# Patient Record
Sex: Male | Born: 1960
Health system: Southern US, Community
[De-identification: ages and names within clinical notes are randomized; demographics above are authoritative.]

## PROBLEM LIST (undated history)

## (undated) DIAGNOSIS — Z86718 Personal history of other venous thrombosis and embolism: Secondary | ICD-10-CM

## (undated) DIAGNOSIS — G709 Myoneural disorder, unspecified: Secondary | ICD-10-CM

## (undated) DIAGNOSIS — R251 Tremor, unspecified: Secondary | ICD-10-CM

## (undated) DIAGNOSIS — B3323 Viral pericarditis: Secondary | ICD-10-CM

## (undated) DIAGNOSIS — M7989 Other specified soft tissue disorders: Secondary | ICD-10-CM

## (undated) DIAGNOSIS — I8290 Acute embolism and thrombosis of unspecified vein: Secondary | ICD-10-CM

## (undated) DIAGNOSIS — M2550 Pain in unspecified joint: Secondary | ICD-10-CM

## (undated) DIAGNOSIS — I499 Cardiac arrhythmia, unspecified: Secondary | ICD-10-CM

## (undated) DIAGNOSIS — M549 Dorsalgia, unspecified: Secondary | ICD-10-CM

## (undated) DIAGNOSIS — S73005A Unspecified dislocation of left hip, initial encounter: Secondary | ICD-10-CM

## (undated) DIAGNOSIS — R7303 Prediabetes: Secondary | ICD-10-CM

## (undated) DIAGNOSIS — M87052 Idiopathic aseptic necrosis of left femur: Secondary | ICD-10-CM

## (undated) DIAGNOSIS — R2689 Other abnormalities of gait and mobility: Secondary | ICD-10-CM

## (undated) DIAGNOSIS — K219 Gastro-esophageal reflux disease without esophagitis: Secondary | ICD-10-CM

## (undated) DIAGNOSIS — M1990 Unspecified osteoarthritis, unspecified site: Secondary | ICD-10-CM

## (undated) DIAGNOSIS — R0602 Shortness of breath: Secondary | ICD-10-CM

## (undated) DIAGNOSIS — J45909 Unspecified asthma, uncomplicated: Secondary | ICD-10-CM

## (undated) DIAGNOSIS — K5900 Constipation, unspecified: Secondary | ICD-10-CM

## (undated) DIAGNOSIS — T4145XA Adverse effect of unspecified anesthetic, initial encounter: Secondary | ICD-10-CM

## (undated) DIAGNOSIS — I10 Essential (primary) hypertension: Secondary | ICD-10-CM

## (undated) DIAGNOSIS — I2510 Atherosclerotic heart disease of native coronary artery without angina pectoris: Secondary | ICD-10-CM

## (undated) DIAGNOSIS — I480 Paroxysmal atrial fibrillation: Secondary | ICD-10-CM

## (undated) DIAGNOSIS — Z95818 Presence of other cardiac implants and grafts: Secondary | ICD-10-CM

## (undated) DIAGNOSIS — I4891 Unspecified atrial fibrillation: Secondary | ICD-10-CM

## (undated) DIAGNOSIS — T8859XA Other complications of anesthesia, initial encounter: Secondary | ICD-10-CM

## (undated) DIAGNOSIS — G629 Polyneuropathy, unspecified: Secondary | ICD-10-CM

## (undated) HISTORY — DX: Unspecified atrial fibrillation: I48.91

## (undated) HISTORY — DX: Dorsalgia, unspecified: M54.9

## (undated) HISTORY — DX: Personal history of other venous thrombosis and embolism: Z86.718

## (undated) HISTORY — PX: TENDON REPAIR: SHX5111

## (undated) HISTORY — DX: Other abnormalities of gait and mobility: R26.89

## (undated) HISTORY — PX: HERNIA REPAIR: SHX51

## (undated) HISTORY — DX: Pain in unspecified joint: M25.50

## (undated) HISTORY — DX: Prediabetes: R73.03

## (undated) HISTORY — DX: Other specified soft tissue disorders: M79.89

## (undated) HISTORY — PX: TOTAL KNEE ARTHROPLASTY: SHX125

## (undated) HISTORY — DX: Polyneuropathy, unspecified: G62.9

## (undated) HISTORY — DX: Unspecified asthma, uncomplicated: J45.909

## (undated) HISTORY — DX: Shortness of breath: R06.02

## (undated) HISTORY — PX: JOINT REPLACEMENT: SHX530

## (undated) HISTORY — PX: CARDIOVASCULAR STRESS TEST: SHX262

## (undated) HISTORY — PX: CARDIAC CATHETERIZATION: SHX172

## (undated) HISTORY — DX: Constipation, unspecified: K59.00

## (undated) HISTORY — DX: Tremor, unspecified: R25.1

## (undated) HISTORY — PX: COLONOSCOPY: SHX174

## (undated) HISTORY — PX: BACK SURGERY: SHX140

## (undated) HISTORY — DX: Paroxysmal atrial fibrillation: I48.0

---
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
## 1998-08-12 ENCOUNTER — Other Ambulatory Visit: Admission: RE | Admit: 1998-08-12 | Discharge: 1998-08-12 | Payer: Self-pay | Admitting: Orthopedic Surgery

## 2000-10-18 ENCOUNTER — Ambulatory Visit (HOSPITAL_COMMUNITY): Admission: RE | Admit: 2000-10-18 | Discharge: 2000-10-18 | Payer: Self-pay | Admitting: General Surgery

## 2000-10-18 ENCOUNTER — Encounter (INDEPENDENT_AMBULATORY_CARE_PROVIDER_SITE_OTHER): Payer: Self-pay | Admitting: Specialist

## 2002-12-19 ENCOUNTER — Ambulatory Visit (HOSPITAL_COMMUNITY): Admission: RE | Admit: 2002-12-19 | Discharge: 2002-12-20 | Payer: Self-pay | Admitting: General Surgery

## 2002-12-19 ENCOUNTER — Encounter (INDEPENDENT_AMBULATORY_CARE_PROVIDER_SITE_OTHER): Payer: Self-pay | Admitting: Specialist

## 2006-11-05 ENCOUNTER — Emergency Department (HOSPITAL_COMMUNITY): Admission: EM | Admit: 2006-11-05 | Discharge: 2006-11-05 | Payer: Self-pay | Admitting: Emergency Medicine

## 2007-07-23 ENCOUNTER — Ambulatory Visit (HOSPITAL_COMMUNITY): Admission: RE | Admit: 2007-07-23 | Discharge: 2007-07-23 | Payer: Self-pay | Admitting: Family Medicine

## 2007-12-13 ENCOUNTER — Ambulatory Visit (HOSPITAL_COMMUNITY): Admission: RE | Admit: 2007-12-13 | Discharge: 2007-12-13 | Payer: Self-pay | Admitting: Family Medicine

## 2008-10-27 ENCOUNTER — Ambulatory Visit: Payer: Self-pay | Admitting: Family Medicine

## 2008-10-27 DIAGNOSIS — J029 Acute pharyngitis, unspecified: Secondary | ICD-10-CM | POA: Insufficient documentation

## 2008-10-28 ENCOUNTER — Encounter: Payer: Self-pay | Admitting: Family Medicine

## 2009-12-22 ENCOUNTER — Inpatient Hospital Stay (HOSPITAL_COMMUNITY)
Admission: RE | Admit: 2009-12-22 | Discharge: 2009-12-25 | Payer: Self-pay | Source: Home / Self Care | Attending: Orthopedic Surgery | Admitting: Orthopedic Surgery

## 2010-01-11 ENCOUNTER — Encounter
Admission: RE | Admit: 2010-01-11 | Discharge: 2010-02-01 | Payer: Self-pay | Source: Home / Self Care | Attending: Orthopedic Surgery | Admitting: Orthopedic Surgery

## 2010-01-23 ENCOUNTER — Encounter: Payer: Self-pay | Admitting: Family Medicine

## 2010-02-01 NOTE — Assessment & Plan Note (Signed)
Summary: SORE THROAT/KH   Vital Signs:  Patient Profile:   50 Years Old Male CC:      sore throat Weight:      279 pounds O2 Sat:      97 % O2 treatment:    Room Air Temp:     98.0 degrees F oral Pulse rate:   75 / minute Resp:     16 per minute BP sitting:   149 / 89  (left arm) Cuff size:   regular  Pt. in pain?   no  Vitals Entered By: Angie Fava (October 27, 2008 4:43 PM)                   Updated Prior Medication List: * CELEBREX once a day LISINOPRIL 20 MG TABS (LISINOPRIL) as directed  History of Present Illness Chief Complaint: sore throat History of Present Illness: Subjective: Patient complains of left sided sore throat for 4 days. + mild cough No pleuritic pain No wheezing No nasal congestion No post-nasal drainage No sinus pain/pressure No itchy/red eyes No earache No hemoptysis No SOB No fever/chills No nausea No vomiting No abdominal pain No diarrhea No skin rashes No fatigue No myalgias No headache    REVIEW OF SYSTEMS Constitutional Symptoms      Denies fever, chills, night sweats, weight loss, weight gain, and fatigue.  Eyes       Denies change in vision, eye pain, eye discharge, glasses, contact lenses, and eye surgery. Ear/Nose/Throat/Mouth       Complains of sore throat.      Denies hearing loss/aids, change in hearing, ear pain, ear discharge, dizziness, frequent runny nose, frequent nose bleeds, sinus problems, hoarseness, and tooth pain or bleeding.  Respiratory       Complains of dry cough.      Denies productive cough, wheezing, shortness of breath, asthma, bronchitis, and emphysema/COPD.  Cardiovascular       Denies murmurs, chest pain, and tires easily with exhertion.    Gastrointestinal       Denies stomach pain, nausea/vomiting, diarrhea, constipation, blood in bowel movements, and indigestion. Genitourniary       Denies painful urination, kidney stones, and loss of urinary control. Neurological       Denies  paralysis, seizures, and fainting/blackouts. Musculoskeletal       Denies muscle pain, joint pain, joint stiffness, decreased range of motion, redness, swelling, muscle weakness, and gout.  Skin       Denies bruising, unusual mles/lumps or sores, and hair/skin or nail changes.  Psych       Denies mood changes, temper/anger issues, anxiety/stress, speech problems, depression, and sleep problems.  Past History:  Past Medical History: high blood pressure  Past Surgical History: Denies surgical history  Family History: none listed  Social History: Alcohol use-yes Drug use-no tobacco use - noDrug Use:  no    Objective:  No acute distress  Eyes:  Pupils are equal, round, and reactive to light and accomdation.  Extraocular movement is intact.  Conjunctivae are not inflamed.  Ears:  Canals normal.  Tympanic membranes normal.   Nose:  Normal septum.  Normal turbinates, mildly congested    No sinus tenderness present.  Pharynx:  Mildly erythematous Neck:  Supple.   Tender shotty left tonsillar node Lungs:  Clear to auscultation.  Breath sounds are equal.  Heart:  Regular rate and rhythm without murmurs, rubs, or gallops.  Abdomen:  Nontender without masses or hepatosplenomegaly.  Bowel sounds are present.  No CVA or flank tenderness.  Rapid strep test negative  Assessment New Problems: ACUTE PHARYNGITIS (ICD-462)  ? non group A strep  Plan New Medications/Changes: AZITHROMYCIN 250 MG TABS (AZITHROMYCIN) Two tabs by mouth on day 1, then 1 tab daily on days 2 through 5  #6 tabs x 0, 10/27/2008, Donna Christen MD  New Orders: Rapid Strep-[facility] [[id number]] T-Culture, Throat [[id number]] New Patient Level III [99203] Planning Comments:   Throat culture. Begin Azithromycin.  Ibuprofen.   Follow-up with PCP if not improving.   The patient and/or caregiver has been counseled thoroughly with regard to medications prescribed including dosage, schedule, interactions, rationale for use,  and possible side effects and they verbalize understanding.  Diagnoses and expected course of recovery discussed and will return if not improved as expected or if the condition worsens. Patient and/or caregiver verbalized understanding.  Prescriptions: AZITHROMYCIN 250 MG TABS (AZITHROMYCIN) Two tabs by mouth on day 1, then 1 tab daily on days 2 through 5  #6 tabs x 0   Entered and Authorized by:   Donna Christen MD   Signed by:   Donna Christen MD on 10/27/2008   Method used:   Print then Give to Patient   RxID:   [id number]   Appended Document: SORE THROAT/KH rapid strep - negative Loel Ro, RN

## 2010-02-02 ENCOUNTER — Ambulatory Visit: Payer: Commercial Managed Care - PPO | Admitting: Physical Therapy

## 2010-02-02 NOTE — Discharge Summary (Signed)
Travis Huff, Travis Huff               ACCOUNT NO.:  [id number]  MEDICAL RECORD NO.:  [id number]          PATIENT TYPE:  INP  LOCATION:  1612                         FACILITY:  [facility]  PHYSICIAN:  Ollen Gross, M.D.    DATE OF BIRTH:  08-Jun-1960  DATE OF ADMISSION:  12/22/2009 DATE OF DISCHARGE:  12/25/2009                              DISCHARGE SUMMARY   ADMISSION DIAGNOSES: 1. Osteoarthritis, left knee greater than right knee. 2. Asthma. 3. Hypertension. 4. Past history of viral pericarditis. 5. Spinal stenosis.  DISCHARGE DIAGNOSES: 1. Osteoarthritis left knee status post left total-knee replacement     arthroplasty. 2. Osteoarthritis right knee. 3. Postoperative acute blood loss anemia, did not require transfusion. 4. Asthma. 5. Hypertension. 6. Past history of viral pericarditis. 7. Spinal stenosis.  PROCEDURE:  December 22, 2009, left total-knee.  SURGEON:  Ollen Gross, M.D.  ASSISTANT:  Alexzandrew L. Perkins, P.A.C.  ANESTHESIA:  General.  TOURNIQUET TIME:  49 minutes.  CONSULTATIONS:  None.  BRIEF HISTORY:  Travis Huff is a 50 year old male with end-stage arthritis of both knees, left more symptomatic than the right, significant valgus malalignment deformity with his instability felt to be bone-on-bone and felt to be a good candidate for surgery.  LABORATORY DATA:  Preoperative CBC showed a hemoglobin of 16.4, hematocrit of 48.0, white cell count 8.9, platelets 173.  Postoperative hemoglobin 12.2 and then 10.4.  Last noted H and H 9.7 and 28.1.  PT/INR 12.4 and 0.9 with a PTT of 37.  Serial prothrombin times were followed per Coumadin protocol.  Last noted PT/INR 20.7 and 1.76.  Chemistry panel on admission all within normal limits.  Serial BMETs were followed for 48 hours and electrolytes remained within normal limits.  Glucose did go up from 94 to 128 and back down to 108.  Preoperative UA was negative with the exception of large bilirubin.  He did have  a hemoglobin A1c ordered which was normal at 5.5.  Nasal swabs were negative for Staphylococcus aureus and negative for MRSA.  RADIOLOGIC:  Preoperative EKG dated December 14, 2009, normal sinus rhythm, incomplete right bundle branch block, confirmed by Dr. Nona Dell.  HOSPITAL COURSE:  The patient was admitted to [facility], taken to the OR and underwent the above-stated procedure without complication.  The patient tolerated the procedure well, had a fair amount of pain on the evening of surgery but doing better on the morning of day one.  Started getting up out of bed and weightbearing as tolerated.  Hemoglobin looked good at 12.2.  Encouraged to use p.o. medications.  By day two the patient was doing a little bit better, pain was decreasing, progressing with therapy.  He was on Coumadin.  Home health was being arranged and by day three he had met his goals, progressing well.  Hemoglobin was down to 9.7 but he was asymptomatic with this and he was discharged home.  DISCHARGE PLAN: 1. The patient was discharged home on December 25, 2009. 2. Discharge diagnoses. Please see above.  DISCHARGE MEDICATIONS:  Robaxin, Percocet, Coumadin, Ambien, lisinopril/HCTZ.  DIET:  Heart-healthy diet.  ACTIVITIES:  Weightbearing as tolerated.  Total-knee protocol.  FOLLOWUP:  Follow up in two weeks.  DISPOSITION:  Home.  CONDITION ON DISCHARGE:  Improved.     Alexzandrew L. Julien Girt, P.A.C.   ______________________________ Ollen Gross, M.D.    ALP/MEDQ  D:  01/13/2010  T:  01/13/2010  Job:  [id number]  cc:   [facility]  Electronically Signed by Patrica Duel P.A.C. on 01/20/2010 11:08:24 AM Electronically Signed by Ollen Gross M.D. on 02/02/2010 03:33:20 PM

## 2010-02-04 ENCOUNTER — Ambulatory Visit: Payer: Commercial Managed Care - PPO | Attending: Orthopedic Surgery | Admitting: Physical Therapy

## 2010-02-04 DIAGNOSIS — R262 Difficulty in walking, not elsewhere classified: Secondary | ICD-10-CM | POA: Insufficient documentation

## 2010-02-04 DIAGNOSIS — IMO0001 Reserved for inherently not codable concepts without codable children: Secondary | ICD-10-CM | POA: Insufficient documentation

## 2010-02-04 DIAGNOSIS — M25669 Stiffness of unspecified knee, not elsewhere classified: Secondary | ICD-10-CM | POA: Insufficient documentation

## 2010-02-04 DIAGNOSIS — M25569 Pain in unspecified knee: Secondary | ICD-10-CM | POA: Insufficient documentation

## 2010-02-04 DIAGNOSIS — Z96659 Presence of unspecified artificial knee joint: Secondary | ICD-10-CM | POA: Insufficient documentation

## 2010-02-07 ENCOUNTER — Ambulatory Visit: Payer: Commercial Managed Care - PPO | Admitting: Physical Therapy

## 2010-02-09 ENCOUNTER — Ambulatory Visit: Payer: Commercial Managed Care - PPO | Admitting: Physical Therapy

## 2010-02-11 ENCOUNTER — Ambulatory Visit: Payer: Commercial Managed Care - PPO | Admitting: Physical Therapy

## 2010-02-14 ENCOUNTER — Ambulatory Visit: Payer: Commercial Managed Care - PPO | Admitting: Physical Therapy

## 2010-02-16 ENCOUNTER — Ambulatory Visit: Payer: Commercial Managed Care - PPO | Admitting: Physical Therapy

## 2010-02-18 ENCOUNTER — Ambulatory Visit: Payer: Commercial Managed Care - PPO | Admitting: Physical Therapy

## 2010-02-21 ENCOUNTER — Ambulatory Visit: Payer: Commercial Managed Care - PPO | Admitting: Physical Therapy

## 2010-02-23 ENCOUNTER — Ambulatory Visit: Payer: Commercial Managed Care - PPO | Admitting: Physical Therapy

## 2010-02-25 ENCOUNTER — Ambulatory Visit: Payer: Commercial Managed Care - PPO | Admitting: Physical Therapy

## 2010-02-28 ENCOUNTER — Ambulatory Visit: Payer: Commercial Managed Care - PPO

## 2010-03-02 ENCOUNTER — Ambulatory Visit: Payer: Commercial Managed Care - PPO | Admitting: Physical Therapy

## 2010-03-04 ENCOUNTER — Ambulatory Visit: Payer: 59 | Attending: Orthopedic Surgery | Admitting: Physical Therapy

## 2010-03-04 DIAGNOSIS — M25669 Stiffness of unspecified knee, not elsewhere classified: Secondary | ICD-10-CM | POA: Insufficient documentation

## 2010-03-04 DIAGNOSIS — R262 Difficulty in walking, not elsewhere classified: Secondary | ICD-10-CM | POA: Insufficient documentation

## 2010-03-04 DIAGNOSIS — IMO0001 Reserved for inherently not codable concepts without codable children: Secondary | ICD-10-CM | POA: Insufficient documentation

## 2010-03-04 DIAGNOSIS — M25569 Pain in unspecified knee: Secondary | ICD-10-CM | POA: Insufficient documentation

## 2010-03-04 DIAGNOSIS — Z96659 Presence of unspecified artificial knee joint: Secondary | ICD-10-CM | POA: Insufficient documentation

## 2010-03-14 LAB — BASIC METABOLIC PANEL WITH GFR
BUN: 10 mg/dL (ref 6–23)
BUN: 6 mg/dL (ref 6–23)
CO2: 27 meq/L (ref 19–32)
CO2: 31 meq/L (ref 19–32)
Calcium: 8.1 mg/dL — ABNORMAL LOW (ref 8.4–10.5)
Calcium: 8.4 mg/dL (ref 8.4–10.5)
Chloride: 102 meq/L (ref 96–112)
Chloride: 97 meq/L (ref 96–112)
Creatinine, Ser: 1.12 mg/dL (ref 0.4–1.5)
Creatinine, Ser: 1.22 mg/dL (ref 0.4–1.5)
GFR calc non Af Amer: >60 mL/min
GFR calc non Af Amer: >60 mL/min
Glucose, Bld: 108 mg/dL — ABNORMAL HIGH (ref 70–99)
Glucose, Bld: 128 mg/dL — ABNORMAL HIGH (ref 70–99)
Potassium: 4.5 meq/L (ref 3.5–5.1)
Potassium: 4.5 meq/L (ref 3.5–5.1)
Sodium: 135 meq/L (ref 135–145)
Sodium: 136 meq/L (ref 135–145)

## 2010-03-14 LAB — CBC
HCT: 28.1 % — ABNORMAL LOW (ref 39.0–52.0)
HCT: 30.5 % — ABNORMAL LOW (ref 39.0–52.0)
HCT: 36.3 % — ABNORMAL LOW (ref 39.0–52.0)
HCT: 48 % (ref 39.0–52.0)
Hemoglobin: 10.4 g/dL — ABNORMAL LOW (ref 13.0–17.0)
Hemoglobin: 12.2 g/dL — ABNORMAL LOW (ref 13.0–17.0)
Hemoglobin: 9.7 g/dL — ABNORMAL LOW (ref 13.0–17.0)
Hemoglobin: 16.4 g/dL (ref 13.0–17.0)
MCH: 31.2 pg (ref 26.0–34.0)
MCH: 31.4 pg (ref 26.0–34.0)
MCH: 31.5 pg (ref 26.0–34.0)
MCH: 31.6 pg (ref 26.0–34.0)
MCHC: 33.6 g/dL (ref 30.0–36.0)
MCHC: 34.1 g/dL (ref 30.0–36.0)
MCHC: 34.5 g/dL (ref 30.0–36.0)
MCHC: 34.2 g/dL (ref 30.0–36.0)
MCV: 91.2 fL (ref 78.0–100.0)
MCV: 91.6 fL (ref 78.0–100.0)
MCV: 93.6 fL (ref 78.0–100.0)
MCV: 92.5 fL (ref 78.0–100.0)
Platelets: 137 K/uL — ABNORMAL LOW (ref 150–400)
Platelets: 141 K/uL — ABNORMAL LOW (ref 150–400)
Platelets: 175 10*3/uL (ref 150–400)
Platelets: 173 10*3/uL (ref 150–400)
RBC: 3.08 MIL/uL — ABNORMAL LOW (ref 4.22–5.81)
RBC: 3.33 MIL/uL — ABNORMAL LOW (ref 4.22–5.81)
RBC: 3.88 MIL/uL — ABNORMAL LOW (ref 4.22–5.81)
RBC: 5.19 MIL/uL (ref 4.22–5.81)
RDW: 12.4 % (ref 11.5–15.5)
RDW: 12.4 % (ref 11.5–15.5)
RDW: 12.7 % (ref 11.5–15.5)
RDW: 12.5 % (ref 11.5–15.5)
WBC: 10.3 K/uL (ref 4.0–10.5)
WBC: 11.6 K/uL — ABNORMAL HIGH (ref 4.0–10.5)
WBC: 12 10*3/uL — ABNORMAL HIGH (ref 4.0–10.5)
WBC: 8.9 10*3/uL (ref 4.0–10.5)

## 2010-03-14 LAB — HEMOGLOBIN A1C
Hgb A1c MFr Bld: 5.5 %
Mean Plasma Glucose: 111 mg/dL

## 2010-03-14 LAB — PROTIME-INR
INR: 1.12 (ref 0.00–1.49)
INR: 1.64 — ABNORMAL HIGH (ref 0.00–1.49)
INR: 1.76 — ABNORMAL HIGH (ref 0.00–1.49)
INR: 0.9 (ref 0.00–1.49)
Prothrombin Time: 14.6 s (ref 11.6–15.2)
Prothrombin Time: 19.6 s — ABNORMAL HIGH (ref 11.6–15.2)
Prothrombin Time: 20.7 s — ABNORMAL HIGH (ref 11.6–15.2)
Prothrombin Time: 12.4 s (ref 11.6–15.2)

## 2010-03-14 LAB — URINALYSIS, ROUTINE W REFLEX MICROSCOPIC
Glucose, UA: NEGATIVE mg/dL
Hgb urine dipstick: NEGATIVE
Ketones, ur: NEGATIVE mg/dL
Nitrite: NEGATIVE
Protein, ur: NEGATIVE mg/dL
Specific Gravity, Urine: 1.027 (ref 1.005–1.030)
Urobilinogen, UA: 0.2 mg/dL (ref 0.0–1.0)
pH: 5.5 (ref 5.0–8.0)

## 2010-03-14 LAB — COMPREHENSIVE METABOLIC PANEL WITH GFR
ALT: 31 U/L (ref 0–53)
AST: 28 U/L (ref 0–37)
Albumin: 3.8 g/dL (ref 3.5–5.2)
Alkaline Phosphatase: 78 U/L (ref 39–117)
BUN: 16 mg/dL (ref 6–23)
CO2: 26 meq/L (ref 19–32)
Calcium: 9.6 mg/dL (ref 8.4–10.5)
Chloride: 103 meq/L (ref 96–112)
Creatinine, Ser: 1.12 mg/dL (ref 0.4–1.5)
GFR calc non Af Amer: >60 mL/min
Glucose, Bld: 94 mg/dL (ref 70–99)
Potassium: 4.3 meq/L (ref 3.5–5.1)
Sodium: 139 meq/L (ref 135–145)
Total Bilirubin: 0.6 mg/dL (ref 0.3–1.2)
Total Protein: 6.9 g/dL (ref 6.0–8.3)

## 2010-03-14 LAB — TYPE AND SCREEN
ABO/RH(D): O NEG
Antibody Screen: NEGATIVE

## 2010-03-14 LAB — SURGICAL PCR SCREEN
MRSA, PCR: NEGATIVE
Staphylococcus aureus: NEGATIVE

## 2010-03-14 LAB — ABO/RH: ABO/RH(D): O NEG

## 2010-03-14 LAB — APTT: aPTT: 37 seconds (ref 24–37)

## 2010-04-04 ENCOUNTER — Other Ambulatory Visit: Payer: Self-pay | Admitting: Orthopedic Surgery

## 2010-04-04 ENCOUNTER — Encounter (HOSPITAL_COMMUNITY): Payer: 59

## 2010-04-04 ENCOUNTER — Other Ambulatory Visit (HOSPITAL_COMMUNITY): Payer: Self-pay | Admitting: Orthopedic Surgery

## 2010-04-04 ENCOUNTER — Ambulatory Visit (HOSPITAL_COMMUNITY)
Admission: RE | Admit: 2010-04-04 | Discharge: 2010-04-04 | Disposition: A | Discharge status: Home / Self Care | Payer: 59 | Source: Ambulatory Visit | Attending: Orthopedic Surgery | Admitting: Orthopedic Surgery

## 2010-04-04 DIAGNOSIS — Z01812 Encounter for preprocedural laboratory examination: Secondary | ICD-10-CM | POA: Insufficient documentation

## 2010-04-04 DIAGNOSIS — Z01811 Encounter for preprocedural respiratory examination: Secondary | ICD-10-CM

## 2010-04-04 DIAGNOSIS — Z01818 Encounter for other preprocedural examination: Secondary | ICD-10-CM | POA: Insufficient documentation

## 2010-04-04 DIAGNOSIS — Z01810 Encounter for preprocedural cardiovascular examination: Secondary | ICD-10-CM | POA: Insufficient documentation

## 2010-04-04 LAB — PROTIME-INR
INR: 0.92 (ref 0.00–1.49)
Prothrombin Time: 12.6 seconds (ref 11.6–15.2)

## 2010-04-04 LAB — COMPREHENSIVE METABOLIC PANEL
ALT: 19 U/L (ref 0–53)
AST: 19 U/L (ref 0–37)
Albumin: 3.7 g/dL (ref 3.5–5.2)
Alkaline Phosphatase: 79 U/L (ref 39–117)
BUN: 15 mg/dL (ref 6–23)
CO2: 27 mEq/L (ref 19–32)
Calcium: 9.4 mg/dL (ref 8.4–10.5)
Chloride: 106 mEq/L (ref 96–112)
Creatinine, Ser: 1.09 mg/dL (ref 0.4–1.5)
GFR calc Af Amer: >60 mL/min (ref >60)
GFR calc non Af Amer: >60 mL/min (ref >60)
Glucose, Bld: 93 mg/dL (ref 70–99)
Potassium: 4.3 mEq/L (ref 3.5–5.1)
Sodium: 138 mEq/L (ref 135–145)
Total Bilirubin: 0.6 mg/dL (ref 0.3–1.2)
Total Protein: 7 g/dL (ref 6.0–8.3)

## 2010-04-04 LAB — URINALYSIS, ROUTINE W REFLEX MICROSCOPIC
Bilirubin Urine: NEGATIVE
Glucose, UA: NEGATIVE mg/dL
Hgb urine dipstick: NEGATIVE
Ketones, ur: NEGATIVE mg/dL
Nitrite: NEGATIVE
Protein, ur: NEGATIVE mg/dL
Specific Gravity, Urine: 1.019 (ref 1.005–1.030)
Urobilinogen, UA: 0.2 mg/dL (ref 0.0–1.0)
pH: 5.5 (ref 5.0–8.0)

## 2010-04-04 LAB — CBC
HCT: 45.1 % (ref 39.0–52.0)
Hemoglobin: 14.6 g/dL (ref 13.0–17.0)
MCH: 28 pg (ref 26.0–34.0)
MCHC: 32.4 g/dL (ref 30.0–36.0)
MCV: 86.4 fL (ref 78.0–100.0)
Platelets: 192 10*3/uL (ref 150–400)
RBC: 5.22 MIL/uL (ref 4.22–5.81)
RDW: 14.5 % (ref 11.5–15.5)
WBC: 6.9 10*3/uL (ref 4.0–10.5)

## 2010-04-04 LAB — SURGICAL PCR SCREEN
MRSA, PCR: NEGATIVE
Staphylococcus aureus: NEGATIVE

## 2010-04-04 LAB — APTT: aPTT: 33 seconds (ref 24–37)

## 2010-04-11 ENCOUNTER — Inpatient Hospital Stay (HOSPITAL_COMMUNITY)
Admission: RE | Admit: 2010-04-11 | Discharge: 2010-04-14 | DRG: 470 | Disposition: A | Discharge status: Home Health | Payer: 59 | Source: Ambulatory Visit | Attending: Orthopedic Surgery | Admitting: Orthopedic Surgery

## 2010-04-11 DIAGNOSIS — Z01812 Encounter for preprocedural laboratory examination: Secondary | ICD-10-CM

## 2010-04-11 DIAGNOSIS — M4800 Spinal stenosis, site unspecified: Secondary | ICD-10-CM | POA: Diagnosis present

## 2010-04-11 DIAGNOSIS — I10 Essential (primary) hypertension: Secondary | ICD-10-CM | POA: Diagnosis present

## 2010-04-11 DIAGNOSIS — Z96659 Presence of unspecified artificial knee joint: Secondary | ICD-10-CM

## 2010-04-11 DIAGNOSIS — E8779 Other fluid overload: Secondary | ICD-10-CM | POA: Diagnosis not present

## 2010-04-11 DIAGNOSIS — D62 Acute posthemorrhagic anemia: Secondary | ICD-10-CM | POA: Diagnosis not present

## 2010-04-11 DIAGNOSIS — M1710 Unilateral primary osteoarthritis, unspecified knee: Principal | ICD-10-CM | POA: Diagnosis present

## 2010-04-11 DIAGNOSIS — J45909 Unspecified asthma, uncomplicated: Secondary | ICD-10-CM | POA: Diagnosis present

## 2010-04-11 DIAGNOSIS — E871 Hypo-osmolality and hyponatremia: Secondary | ICD-10-CM | POA: Diagnosis not present

## 2010-04-11 DIAGNOSIS — M21869 Other specified acquired deformities of unspecified lower leg: Secondary | ICD-10-CM | POA: Diagnosis present

## 2010-04-11 LAB — TYPE AND SCREEN
ABO/RH(D): O NEG
Antibody Screen: NEGATIVE

## 2010-04-12 LAB — BASIC METABOLIC PANEL
BUN: 9 mg/dL (ref 6–23)
CO2: 30 mEq/L (ref 19–32)
Calcium: 8.3 mg/dL — ABNORMAL LOW (ref 8.4–10.5)
Chloride: 100 mEq/L (ref 96–112)
Creatinine, Ser: 1.03 mg/dL (ref 0.4–1.5)
GFR calc Af Amer: >60 mL/min (ref >60)
GFR calc non Af Amer: >60 mL/min (ref >60)
Glucose, Bld: 132 mg/dL — ABNORMAL HIGH (ref 70–99)
Potassium: 4.6 mEq/L (ref 3.5–5.1)
Sodium: 135 mEq/L (ref 135–145)

## 2010-04-12 LAB — CBC
HCT: 37.2 % — ABNORMAL LOW (ref 39.0–52.0)
Hemoglobin: 11.7 g/dL — ABNORMAL LOW (ref 13.0–17.0)
MCH: 27.3 pg (ref 26.0–34.0)
MCHC: 31.5 g/dL (ref 30.0–36.0)
MCV: 86.7 fL (ref 78.0–100.0)
Platelets: 158 10*3/uL (ref 150–400)
RBC: 4.29 MIL/uL (ref 4.22–5.81)
RDW: 14.8 % (ref 11.5–15.5)
WBC: 10.1 10*3/uL (ref 4.0–10.5)

## 2010-04-13 LAB — BASIC METABOLIC PANEL
BUN: 6 mg/dL (ref 6–23)
CO2: 30 mEq/L (ref 19–32)
Calcium: 8.2 mg/dL — ABNORMAL LOW (ref 8.4–10.5)
Chloride: 96 mEq/L (ref 96–112)
Creatinine, Ser: 0.86 mg/dL (ref 0.4–1.5)
GFR calc Af Amer: >60 mL/min (ref >60)
GFR calc non Af Amer: >60 mL/min (ref >60)
Glucose, Bld: 113 mg/dL — ABNORMAL HIGH (ref 70–99)
Potassium: 4 mEq/L (ref 3.5–5.1)
Sodium: 131 mEq/L — ABNORMAL LOW (ref 135–145)

## 2010-04-13 LAB — CBC
HCT: 31.6 % — ABNORMAL LOW (ref 39.0–52.0)
Hemoglobin: 10.3 g/dL — ABNORMAL LOW (ref 13.0–17.0)
MCH: 27.1 pg (ref 26.0–34.0)
MCHC: 32.6 g/dL (ref 30.0–36.0)
MCV: 83.2 fL (ref 78.0–100.0)
Platelets: 150 10*3/uL (ref 150–400)
RBC: 3.8 MIL/uL — ABNORMAL LOW (ref 4.22–5.81)
RDW: 14.4 % (ref 11.5–15.5)
WBC: 10.1 10*3/uL (ref 4.0–10.5)

## 2010-04-14 LAB — BASIC METABOLIC PANEL
BUN: 7 mg/dL (ref 6–23)
CO2: 29 mEq/L (ref 19–32)
Calcium: 8.3 mg/dL — ABNORMAL LOW (ref 8.4–10.5)
Chloride: 98 mEq/L (ref 96–112)
Creatinine, Ser: 0.85 mg/dL (ref 0.4–1.5)
GFR calc Af Amer: >60 mL/min (ref >60)
GFR calc non Af Amer: >60 mL/min (ref >60)
Glucose, Bld: 114 mg/dL — ABNORMAL HIGH (ref 70–99)
Potassium: 4.1 mEq/L (ref 3.5–5.1)
Sodium: 134 mEq/L — ABNORMAL LOW (ref 135–145)

## 2010-04-14 LAB — CBC
HCT: 28.2 % — ABNORMAL LOW (ref 39.0–52.0)
Hemoglobin: 9.3 g/dL — ABNORMAL LOW (ref 13.0–17.0)
MCH: 27.4 pg (ref 26.0–34.0)
MCHC: 33 g/dL (ref 30.0–36.0)
MCV: 82.9 fL (ref 78.0–100.0)
Platelets: 137 10*3/uL — ABNORMAL LOW (ref 150–400)
RBC: 3.4 MIL/uL — ABNORMAL LOW (ref 4.22–5.81)
RDW: 14.5 % (ref 11.5–15.5)
WBC: 8.4 10*3/uL (ref 4.0–10.5)

## 2010-04-15 NOTE — H&P (Signed)
  NAME:  Travis Huff, Travis Huff               ACCOUNT NO.:  [id number]  MEDICAL RECORD NO.:  [id number]           PATIENT TYPE:  I  LOCATION:  0006                         FACILITY:  [facility]  PHYSICIAN:  Ollen Gross, M.D.    DATE OF BIRTH:  November 17, 1960  DATE OF ADMISSION:  04/11/2010 DATE OF DISCHARGE:                             HISTORY & PHYSICAL   CHIEF COMPLAINT:  Right knee pain.  HISTORY OF PRESENT ILLNESS:  The patient is a 50 year old male well- known to Dr. Ollen Gross who had previously undergone a left total- knee this past fall, which he is doing well with, but the right knee continues to be problematic.  He has reached the point where he would like to have the other side done.  ALLERGIES:  NO KNOWN DRUG ALLERGIES.  CURRENT MEDICATIONS:  Lisinopril.  PAST MEDICAL HISTORY: 1. Asthma. 2. Hypertension. 3. Past history of viral pericarditis. 4. Spinal stenosis.  PAST SURGICAL HISTORY:  Left total-knee replacement.  FAMILY HISTORY:  The patient is adopted.  SOCIAL HISTORY:  Married.  Works as a Counsellor.  Dips tobacco for about 30 years now.  Intake of beer twice daily.  He has two children.  His wife will be assisting with care after surgery.  He does have a living will.  REVIEW OF SYSTEMS:  GENERAL:  No fevers, chills or night sweats. NEUROLOGIC:  No seizures, syncope or paralysis.  RESPIRATORY:  No shortness of breath, productive cough or hemoptysis.  CARDIOVASCULAR: No chest pain.  No orthopnea.  GASTROINTESTINAL:  No nausea, vomiting, diarrhea, or constipation.  GENITOURINARY:  No dysuria, hematuria or discharge.  MUSCULOSKELETAL:  Knee pain.  PHYSICAL EXAMINATION:  VITAL SIGNS:  Pulse 74, respirations 18, blood pressure 118/88. GENERAL:  The patient is a 50 year old male, well-nourished, well- developed, no acute distress.  He is alert, oriented and cooperative. He is overweight.  He is a good historian. HEENT:  Normocephalic, atraumatic.  Pupils are equal, round  and reactive.  EOMs intact. NECK:  Supple. CHEST:  He is somewhat of a barrel-chested individual.  His chest is clear anterior and posterior chest wall. HEART:  Regular rate and rhythm without murmur.  S1 and S2 noted. ABDOMEN:  Soft, round, protuberant abdomen.  Bowel sounds present. RECTAL/BREAST/GENITALIA:  Not done as not pertinent to present illness. EXTREMITIES:  Right knee valgus malalignment deformity with range of motion 0-125.  Tender more lateral than the medial.  Moderate crepitus.  IMPRESSION:  Osteoarthritis of the right knee.  PLAN:  The patient was admitted to [facility] to undergo a right total-knee replacement arthroplasty.  Surgery will be performed by Dr. Ollen Gross.     Alexzandrew L. Julien Girt, P.A.C.   ______________________________ Ollen Gross, M.D.    ALP/MEDQ  D:  04/11/2010  T:  04/11/2010  Job:  [id number]  cc:   Joycelyn Rua, M.D. Fax: [phone]  Electronically Signed by Patrica Duel P.A.C. on 04/11/2010 08:33:06 AM Electronically Signed by Ollen Gross M.D. on 04/15/2010 09:01:42 AM

## 2010-04-15 NOTE — Discharge Summary (Signed)
NAME:  Travis Huff, Travis Huff               ACCOUNT NO.:  [id number]  MEDICAL RECORD NO.:  [id number]           PATIENT TYPE:  I  LOCATION:  1609                         FACILITY:  [facility]  PHYSICIAN:  Ollen Gross, M.D.    DATE OF BIRTH:  May 01, 1960  DATE OF ADMISSION:  04/11/2010 DATE OF DISCHARGE:  04/14/2010                              DISCHARGE SUMMARY   ADMITTING DIAGNOSES: 1. Osteoarthritis of the right knee. 2. Asthma. 3. Hypertension. 4. Past history of viral pericarditis. 5. Spinal stenosis.  DISCHARGE DIAGNOSES: 1. Osteoarthritis of the right knee with valgus deformity status post     right total-knee arthroplasty. 2. Postoperative acute blood loss anemia, did not require a     transfusion. 3. Postoperative hyponatremia, improving. 4. Asthma. 5. Hypertension. 6. Past history of viral pericarditis. 7. Spinal stenosis.  PROCEDURE:  April 11, 2010, right total-knee.  SURGEON:  Ollen Gross, M.D.  ASSISTANT:  Alexzandrew L. Perkins, P.A.C.  ANESTHESIA:  General.  TOURNIQUET TIME:  50 minutes.  CONSULTATIONS:  None.  BRIEF HISTORY:  The patient is a 50 year old male with end-stage arthritis and valgus deformity of the right knee.  He has had a successful left total-knee and now presents for a right total-knee.  LABORATORY DATA:  Preoperative CBC showed a hemoglobin of 14.6, hematocrit of 45.1, white cell count 6.9, platelets 192.  PT/INR 12.6 and 0.92 with a PTT of 33.  Chemistry panel on admission all within normal limits.  Preoperative UA negative.  Blood group type O negative. Nasal swabs were negative for Staphylococcus aureus and negative for MRSA.  Postoperative hemoglobin went down to 11.7 and then 10.3.  Last noted H and H were 9.3 and 28.2.  Serial BMETs were followed.  Sodium dropped from 138 to 131 and back up to 134, improving.  Remaining electrolytes remained within normal limits.  RADIOLOGIC:  EKG dated December 14, 2009, normal sinus  rhythm, incomplete right bundle branch block, no previous tracing, confirmed by Dr. Nona Dell.  HOSPITAL COURSE:  The patient was admitted to [facility], taken to the OR and underwent the above-stated procedure without complication.  The patient tolerated the procedure well and was later transferred to the recovery room on the orthopedic floor.  He was started on p.o. and IV analgesics.  He did pretty well on the morning of day #1.  He had a lot of blood on his drainage.  His dressing was changed on day #1.  We noted that there was no active bleeding from the incision.  The Hemovac drain came apart and he was draining out of the Hemovac tubing onto the dressing, so that was changed.  He did have some volume overload that first day, so we gave him some Lasix to help diurese his fluid.  His hemoglobin was stable.  His pressure was fine. No other complaints.  By day #2, he was already up walking over 200 feet.  He was diuresing his fluids quite well.  Still was positive, but he was improving in the volume status.  His ACE inhibitor was held postoperatively, but we resumed in on postoperative day #2.  Dressing changed and the incision looked good.  He continued to progress well and by postoperative day #3 he was seen in rounds, tolerating his medications, pain was under decent control and he was discharged home at that time.  DISCHARGE PLAN:  The patient was discharged home on April 14, 2010.  DISCHARGE DIAGNOSES:  Please see above.  DISCHARGE MEDICATIONS:  OxyIR, Robaxin, Xarelto, Nu-Iron.  Continue home medications of Benadryl and lisinopril.  DIET:  Heart-healthy diet.  ACTIVITY:  Weightbearing as tolerated.  Total-knee protocol.  Home health PT.  FOLLOWUP:  Follow up in 2 weeks.  DISPOSITION:  Home.  CONDITION ON DISCHARGE:  Improved.     Alexzandrew L. Julien Girt, P.A.C.   ______________________________ Ollen Gross, M.D.    ALP/MEDQ  D:  04/14/2010   T:  04/14/2010  Job:  [id number]  cc:   Joycelyn Rua, M.D. Fax: [phone]  Electronically Signed by Patrica Duel P.A.C. on 04/14/2010 11:12:27 AM Electronically Signed by Ollen Gross M.D. on 04/15/2010 09:01:48 AM

## 2010-04-15 NOTE — Op Note (Signed)
NAME:  Travis Huff, Travis Huff               ACCOUNT NO.:  [id number]  MEDICAL RECORD NO.:  [id number]           PATIENT TYPE:  I  LOCATION:  1609                         FACILITY:  [facility]  PHYSICIAN:  Ollen Gross, M.D.    DATE OF BIRTH:  Dec 26, 1960  DATE OF PROCEDURE:  04/11/2010 DATE OF DISCHARGE:                              OPERATIVE REPORT   PREOPERATIVE DIAGNOSIS:  Osteoarthritis, right knee, with valgus deformity.  POSTOPERATIVE DIAGNOSIS:  Osteoarthritis, right knee, with valgus deformity.  PROCEDURE:  Right total knee arthroplasty.  SURGEON:  Ollen Gross, M.D.  ASSISTANT:  Alexzandrew L. Perkins, P.A.C.  ANESTHESIA:  General.  ESTIMATED BLOOD LOSS:  Minimal.  DRAINS:  Hemovac x1.  COMPLICATIONS:  None.  TOURNIQUET TIME:  50 minutes at 300 mmHg.  CONDITION:  Stable to recovery room.  BRIEF CLINICAL NOTE:  Travis Huff is a 50 year old male with end-stage valgus arthritis of the right knee with progressively worsening pain and dysfunction.  He has had a recent successful left total knee arthroplasty, presents now for right total knee arthroplasty.  PROCEDURE IN DETAIL:  After successful administration of general anesthetic, the patient was placed on the operating table and a tourniquet placed high on his right thigh.  The right lower extremity was prepped and draped in the usual sterile fashion.  Extremity was wrapped in Esmarch, knee flexed, tourniquet inflated to 300 mmHg. Midline incision was made with a 10 blade through subcutaneous tissue to the level of the extensor mechanism.  Given his valgus deformity, I did a lateral approach.  A fresh blade was used to make a lateral parapatellar arthrotomy.  Soft tissue over the proximal lateral tibia subperiosteally elevated around the joint line.  It was elevated to the posterolateral corner but did not include structures though of the posterolateral corner.  The patella was then everted medially, knee flexed to 90 degrees  and the ACL and PCL removed.  Drill was used to create a starting hole in the distal femur.  The canal was thoroughly irrigated.  A 5-degree right valgus alignment guide was placed and distal femoral cutting block was pinned to remove the 11 mm off the distal femur.  I had to go with 11 to fairly get any bone off the lateral side.  Resection was made with an oscillating saw.  Sizing guide was placed and size 4 was most appropriate.  Rotation was marked off the epicondylar axis.  Size 4 cutting block was placed and the anterior- posterior chamfer cuts were made.  Tibia was subluxed forward, menisci removed.  Extramedullary tibial alignment guide was placed referencing proximally at the medial aspect of the tibial tubercle and distally along the second metatarsal axis and tibial crest.  The block was pinned to remove 2 mm off the more deficient lateral side.  Tibial resection was made with an oscillating saw.  Size 4 was the most appropriate tibial component and proximal tibia was prepared with the modular drill and keel punch for the size 4. Femoral preparation was completed with the intercondylar cut.  Size 4 mobile bearing tibial trial, size 4 posterior stabilized femoral trial and 12.5 mm  posterior stabilized rotating platform insert trial were placed.  With the 12, there was a tiny bit place, so went to 15 which allowed for full extension with excellent varus-valgus anterior- posterior balance throughout full range of motion.  Patella was everted and thickness measured to be 24 mm.  Freehand resection was taken to 14 mm, 38 template was placed, lug holes were drilled, trial patella was placed and it tracked normally.  Osteophytes were removed off the posterior femur with the trial in place.  All trials were removed and the cut bone surfaces were prepared with pulsatile lavage.  Cement was mixed and once ready for implantation, the size 4 mobile bearing tibial tray, size 4 posterior  stabilized femur and 38 patella were cemented into place and patella was held with a clamp.  Trial 15-mm inserts placed, knee held in full extension, all extruded cement removed.  When the cement fully hardened, then the permanent 15-mm posterior stabilized rotating platform insert was placed in the tibial tray.  The wounds were copiously irrigated with saline solution and the arthrotomy closed over Hemovac drain with interrupted #1 PDS leaving open a small area of the superior to inferior pole of patella to serve as a mini lateral release. Tourniquet was released at total time of 50 minutes.  Subcu was closed with interrupted 2-0 Vicryl and subcuticular running 4-0 Monocryl.  The catheter for Marcaine pain pump was placed and pumps initiated. Incision was cleaned and dried and Steri-Strips and bulky sterile dressing applied.  He was then placed into a knee immobilizer, awakened and transferred to recovery in stable condition.     Ollen Gross, M.D.     FA/MEDQ  D:  04/11/2010  T:  04/11/2010  Job:  [id number]  Electronically Signed by Ollen Gross M.D. on 04/15/2010 09:01:45 AM

## 2010-05-03 ENCOUNTER — Ambulatory Visit: Payer: 59 | Attending: Orthopedic Surgery

## 2010-05-03 DIAGNOSIS — Z96659 Presence of unspecified artificial knee joint: Secondary | ICD-10-CM | POA: Insufficient documentation

## 2010-05-03 DIAGNOSIS — M25669 Stiffness of unspecified knee, not elsewhere classified: Secondary | ICD-10-CM | POA: Insufficient documentation

## 2010-05-03 DIAGNOSIS — R262 Difficulty in walking, not elsewhere classified: Secondary | ICD-10-CM | POA: Insufficient documentation

## 2010-05-03 DIAGNOSIS — M25569 Pain in unspecified knee: Secondary | ICD-10-CM | POA: Insufficient documentation

## 2010-05-03 DIAGNOSIS — IMO0001 Reserved for inherently not codable concepts without codable children: Secondary | ICD-10-CM | POA: Insufficient documentation

## 2010-05-04 ENCOUNTER — Ambulatory Visit: Payer: 59

## 2010-05-05 ENCOUNTER — Ambulatory Visit: Payer: 59 | Admitting: Physical Therapy

## 2010-05-09 ENCOUNTER — Encounter: Payer: Commercial Managed Care - PPO | Admitting: Physical Therapy

## 2010-05-10 ENCOUNTER — Ambulatory Visit: Payer: 59 | Admitting: Physical Therapy

## 2010-05-11 ENCOUNTER — Ambulatory Visit: Payer: 59

## 2010-05-12 ENCOUNTER — Ambulatory Visit: Payer: 59 | Admitting: Physical Therapy

## 2010-05-16 ENCOUNTER — Ambulatory Visit: Payer: 59

## 2010-05-16 ENCOUNTER — Encounter: Payer: Commercial Managed Care - PPO | Admitting: Physical Therapy

## 2010-05-18 ENCOUNTER — Ambulatory Visit: Payer: 59 | Admitting: Physical Therapy

## 2010-05-20 ENCOUNTER — Ambulatory Visit: Payer: 59 | Admitting: Physical Therapy

## 2010-05-20 NOTE — Op Note (Signed)
[facility]  Patient:    Travis Huff, Travis Huff Visit Number: [id number] MRN: [id number]          Service Type: DSU Location: DAY Attending Physician:  Cherylynn Ridges Proc. Date: 10/18/00 Admit Date:  10/18/2000                             Operative Report  PREOPERATIVE DIAGNOSIS:  Supraumbilical periumbilical hernia.  POSTOPERATIVE DIAGNOSIS:  Supraumbilical periumbilical hernia.  PROCEDURE:  Repair of supraumbilical ventral hernia.  SURGEON:  Jimmye Norman, M.D.  ASSISTANT:  None.  ANESTHESIA:  General endotracheal.  ESTIMATED BLOOD LOSS:  Less than 20 cc.  COMPLICATIONS:  None.  CONDITION:  Stable.  FINDINGS:  There is a 3 cm supraumbilical fascial defect.  Entrapped preperitoneal fat was contained within the hernia sac.  DESCRIPTION OF PROCEDURE:  The patient was taken to the operating room and placed on the table in the supine position.  After an adequate general anesthetic was administered, he was prepped and draped in the usual sterile manner as well as in the paraumbilical area.  A supraumbilical curvilinear incision was made using a #10 blade and taken down to the subcu.  Subsequently, the largest hernia sac was dissected away from the umbilicus down to its fascial opening in the mid portion.  The hernia sac was excised along with some preperitoneal fat which was ligated with 2-0 Vicryl suture.  Once this was done, we repaired the hernia using interrupted #1 Prolene sutures.  Upon further inspection of the wound, however, there was still a hernia going inferiorly towards the umbilicus and attached to the base of the umbilicus with a separate sac.  We were able to dissect this out and then subsequently repair the fascial defect using a figure-of-eight stitch of #1 Prolene.  Once this was done, we irrigated with saline, attached the base of the umbilicus down to the base of the repair and closed the subcu with 3-0 Vicryl.  The  subcuticular stitch was closed using 5-0 Vicryl and a small P3 needle.  Marcaine 0.25% with epinephrine was injected into the wound and sterile dressings applied. Attending Physician:  Cherylynn Ridges DD:  10/18/00 TD:  10/19/00 Job: [id number]

## 2010-05-20 NOTE — H&P (Signed)
[place]. [facility]  Patient:    Travis Huff, Travis Huff Visit Number: [id number] MRN: [id number]          Service Type: Attending:  Jimmye Norman, M.D. Dictated by:   Jimmye Norman, M.D. Adm. Date:  10/17/00                           History and Physical  DATE OF BIRTH: February 20, 1960  IDENTIFICATION/CHIEF COMPLAINT: The patient is a 50 year old with a periumbilical ventral hernia, who comes in now for repair.  HISTORY OF PRESENT ILLNESS: I first saw Travis Huff on August 27, 2000 several days after he had been to [facility] and had noticed a bulge in his periumbilical area.  It worsened the day prior to our seeing him and became hard and very tender; however, he responded to pain medicines.  The patient has a prior history of pericarditis and was on steroids for that. He was seen by his cardiologist, Dr. Patty Sermons, who has attempted over several months to taper off his steroids unsuccessfully.  That is his only previous medical problem.  PAST MEDICAL HISTORY: Pericarditis.  He is otherwise doing okay.  MEDICATIONS: Prednisone 15 mg q.d.  SOCIAL HISTORY: He does not smoke but does occasionally take alcohol, about two beers a day is what he admits to.  REVIEW OF SYSTEMS: Unremarkable.  PHYSICAL EXAMINATION: He has a periumbilical hernia which is in the supraumbilical area.  It is not reducible but no longer tender now that he is on pain medication.  IMPRESSION: Supraumbilical periumbilical hernia.  PLAN: Repair.  The patient will get preoperative antibiotics along with stress doses of steroids.  Will do this as an outpatient. Dictated by:   Jimmye Norman, M.D. Attending:  Jimmye Norman, M.D. DD:  10/17/00 TD:  10/18/00 Job: [id number]

## 2010-05-20 NOTE — Op Note (Signed)
NAME:  Travis Huff, Travis Huff                         ACCOUNT NO.:  [id number]   MEDICAL RECORD NO.:  [id number]                   PATIENT TYPE:  OIB   LOCATION:  2899                                 FACILITY:  [facility]   PHYSICIAN:  Jimmye Norman III, M.D.               DATE OF BIRTH:  1960-12-13   DATE OF PROCEDURE:  12/19/2002  DATE OF DISCHARGE:                                 OPERATIVE REPORT   PREOPERATIVE DIAGNOSIS:  Recurrent ventral hernia.   POSTOPERATIVE DIAGNOSIS:  Recurrent ventral hernia.   PROCEDURE:  Repair of ventral hernia with mesh.   SURGEON:  Jimmye Norman, M.D.   ASSISTANT:  None.   ANESTHESIA:  General endotracheal anesthesia.   ESTIMATED BLOOD LOSS:  100 mL.   COMPLICATIONS:  Umbilical tear. Condition stable.   INDICATIONS FOR PROCEDURE:  The patient is a 50 year old with a recurrent  ventral hernia who comes in for repair.   FINDINGS:  The patient had a 10-cm ventral hernia which was intimately  attached to the umbilicus. During dissection we made a button hole in the  umbilicus  which was repaired. We subsequently went ahead. He had no  incarcerated  bowel, no infections.   DESCRIPTION OF PROCEDURE:  The patient was taken to the operating room and  placed on the table in the supine position. After an adequate endotracheal  anesthetic was administered, he was prepped and draped in the usual sterile  manner, exposing the midline of the abdomen.   The upper portion of the palpable fascial defect was noted. We made a  midline incision from above that down to below the umbilicus. We then  dissected down in the subcu around the fascia on the hernia sac down to the  fascial edge. Just to the left of the umbilicus, the fascial defect was  dissected out and you could see the sutures from the previous  repair had  torn through  the fascia.   We dissected out the side from the fascial edge and I got an adequate  clearance about 3 to 4 cm circumferentially around the  fascial edge. Once we  removed this again I noticed no bowel entrapped. We did figure-of-8  interrupted stitches of #1 Novofil to close the defect primarily. We then  tacked down a 4 x 6 cm over a piece of mesh circumferentially around the  primary repair in an onlay manner. We tacked it down with 10 #1 Novofils  with 2 middle ones attaching into the midline  repair itself. We irrigated  with antibiotic solution as we soaked the mesh in it prior to closure.   We then closed the subcu on top of the mesh using running 2-0 Vicryl suture.  The subcu was reapproximated with 3-0 Vicryl and then the skin was closed  using stainless steel staples.   The umbilicus  was repaired prior to closure using 5-0 nylon inside at the  skin level on the umbilicus  and in the  subcuticular area using a 5-0  Vicryl to reapproximate the subcu onto the dermis. We closed finally and put  a sterile dressing. The patient was taken to the recovery room in stable  condition.                                               Kathrin Ruddy, M.D.    JW/MEDQ  D:  12/19/2002  T:  12/20/2002  Job:  [id number]

## 2010-05-23 ENCOUNTER — Ambulatory Visit: Payer: 59 | Admitting: Physical Therapy

## 2010-05-25 ENCOUNTER — Ambulatory Visit: Payer: 59 | Admitting: Physical Therapy

## 2010-05-27 ENCOUNTER — Ambulatory Visit: Payer: 59 | Admitting: Physical Therapy

## 2010-05-31 ENCOUNTER — Ambulatory Visit: Payer: 59

## 2010-06-01 ENCOUNTER — Ambulatory Visit: Payer: 59

## 2010-06-03 ENCOUNTER — Ambulatory Visit: Payer: 59 | Attending: Orthopedic Surgery

## 2010-06-03 DIAGNOSIS — M25669 Stiffness of unspecified knee, not elsewhere classified: Secondary | ICD-10-CM | POA: Insufficient documentation

## 2010-06-03 DIAGNOSIS — IMO0001 Reserved for inherently not codable concepts without codable children: Secondary | ICD-10-CM | POA: Insufficient documentation

## 2010-06-03 DIAGNOSIS — Z96659 Presence of unspecified artificial knee joint: Secondary | ICD-10-CM | POA: Insufficient documentation

## 2010-06-03 DIAGNOSIS — R262 Difficulty in walking, not elsewhere classified: Secondary | ICD-10-CM | POA: Insufficient documentation

## 2010-06-03 DIAGNOSIS — M25569 Pain in unspecified knee: Secondary | ICD-10-CM | POA: Insufficient documentation

## 2010-06-06 ENCOUNTER — Ambulatory Visit: Payer: 59

## 2010-06-08 ENCOUNTER — Ambulatory Visit: Payer: 59 | Admitting: Physical Therapy

## 2010-06-10 ENCOUNTER — Ambulatory Visit: Payer: 59

## 2011-01-13 ENCOUNTER — Encounter: Payer: Self-pay | Admitting: Emergency Medicine

## 2011-01-13 ENCOUNTER — Emergency Department
Admission: EM | Admit: 2011-01-13 | Discharge: 2011-01-13 | Disposition: A | Discharge status: Home / Self Care | Payer: Self-pay | Source: Home / Self Care

## 2011-01-13 DIAGNOSIS — Z23 Encounter for immunization: Secondary | ICD-10-CM

## 2011-01-13 HISTORY — DX: Essential (primary) hypertension: I10

## 2011-01-13 MED ORDER — INFLUENZA VAC TYP A&B SURF ANT IM INJ
0.5000 mL | INJECTION | INTRAMUSCULAR | Status: AC
  Administered 2011-01-13: 0.5 mL via INTRAMUSCULAR

## 2011-01-13 NOTE — ED Notes (Signed)
Desires flu vaccination. 

## 2011-05-04 ENCOUNTER — Other Ambulatory Visit (HOSPITAL_BASED_OUTPATIENT_CLINIC_OR_DEPARTMENT_OTHER): Payer: Self-pay | Admitting: Family Medicine

## 2011-05-04 DIAGNOSIS — M545 Low back pain: Secondary | ICD-10-CM

## 2011-05-06 ENCOUNTER — Ambulatory Visit (HOSPITAL_BASED_OUTPATIENT_CLINIC_OR_DEPARTMENT_OTHER)
Admission: RE | Admit: 2011-05-06 | Discharge: 2011-05-06 | Disposition: A | Discharge status: Home / Self Care | Payer: PRIVATE HEALTH INSURANCE | Source: Ambulatory Visit | Attending: Family Medicine | Admitting: Family Medicine

## 2011-05-06 DIAGNOSIS — M545 Low back pain: Secondary | ICD-10-CM

## 2011-05-06 DIAGNOSIS — M25559 Pain in unspecified hip: Secondary | ICD-10-CM

## 2011-05-06 DIAGNOSIS — M5137 Other intervertebral disc degeneration, lumbosacral region: Secondary | ICD-10-CM

## 2011-05-06 DIAGNOSIS — M47817 Spondylosis without myelopathy or radiculopathy, lumbosacral region: Secondary | ICD-10-CM | POA: Insufficient documentation

## 2011-05-06 DIAGNOSIS — M51379 Other intervertebral disc degeneration, lumbosacral region without mention of lumbar back pain or lower extremity pain: Secondary | ICD-10-CM | POA: Insufficient documentation

## 2011-05-31 ENCOUNTER — Ambulatory Visit: Payer: 59 | Attending: Neurological Surgery

## 2011-05-31 DIAGNOSIS — R5381 Other malaise: Secondary | ICD-10-CM | POA: Insufficient documentation

## 2011-05-31 DIAGNOSIS — M25659 Stiffness of unspecified hip, not elsewhere classified: Secondary | ICD-10-CM | POA: Insufficient documentation

## 2011-05-31 DIAGNOSIS — M545 Low back pain, unspecified: Secondary | ICD-10-CM | POA: Insufficient documentation

## 2011-05-31 DIAGNOSIS — IMO0001 Reserved for inherently not codable concepts without codable children: Secondary | ICD-10-CM | POA: Insufficient documentation

## 2011-06-07 ENCOUNTER — Ambulatory Visit: Payer: Managed Care, Other (non HMO) | Attending: Neurological Surgery | Admitting: Physical Therapy

## 2011-06-07 DIAGNOSIS — M545 Low back pain, unspecified: Secondary | ICD-10-CM | POA: Insufficient documentation

## 2011-06-07 DIAGNOSIS — IMO0001 Reserved for inherently not codable concepts without codable children: Secondary | ICD-10-CM | POA: Insufficient documentation

## 2011-06-07 DIAGNOSIS — M25659 Stiffness of unspecified hip, not elsewhere classified: Secondary | ICD-10-CM | POA: Insufficient documentation

## 2011-06-07 DIAGNOSIS — R5381 Other malaise: Secondary | ICD-10-CM | POA: Insufficient documentation

## 2011-06-09 ENCOUNTER — Encounter: Payer: 59 | Admitting: Physical Therapy

## 2011-06-16 ENCOUNTER — Encounter: Payer: 59 | Admitting: Physical Therapy

## 2011-06-23 ENCOUNTER — Encounter: Payer: 59 | Admitting: Physical Therapy

## 2011-06-28 ENCOUNTER — Encounter: Payer: 59 | Admitting: Physical Therapy

## 2011-06-30 ENCOUNTER — Encounter: Payer: 59 | Admitting: Physical Therapy

## 2011-12-17 ENCOUNTER — Emergency Department
Admission: EM | Admit: 2011-12-17 | Discharge: 2011-12-17 | Disposition: A | Discharge status: Home / Self Care | Payer: Self-pay | Source: Home / Self Care | Attending: Emergency Medicine | Admitting: Emergency Medicine

## 2011-12-17 DIAGNOSIS — J209 Acute bronchitis, unspecified: Secondary | ICD-10-CM

## 2011-12-17 DIAGNOSIS — J029 Acute pharyngitis, unspecified: Secondary | ICD-10-CM

## 2011-12-17 LAB — POCT INFLUENZA A/B
Influenza A, POC: NEGATIVE
Influenza B, POC: NEGATIVE

## 2011-12-17 MED ORDER — PROMETHAZINE-CODEINE 6.25-10 MG/5ML PO SYRP: ORAL_SOLUTION | ORAL | Status: DC

## 2011-12-17 MED ORDER — AZITHROMYCIN 250 MG PO TABS: ORAL_TABLET | ORAL | Status: DC

## 2011-12-17 NOTE — ED Provider Notes (Signed)
History     CSN: [id number]  Arrival date & time 12/17/11  1129   First MD Initiated Contact with Patient 12/17/11 1138      Chief Complaint  Patient presents with  . Cough  . Headache   Patient is a 51 y.o. male presenting with cough and headaches. The history is provided by the patient.  Cough Associated symptoms include headaches.  Headache The primary symptoms include headaches.   URI HISTORY  Travis Huff is a 51 y.o. male who complains of onset of cold symptoms for 2 days.  Have been using over-the-counter treatment which helps a little bit.  Positive chills/sweats +  Fever 101.6 today.  +  Nasal congestion +  Discolored Post-nasal drainage No sinus pain/pressure Minimal, irritated sore throat  +  Cough, productive of yellow sputum No wheezing Positive chest congestion No hemoptysis No shortness of breath No pleuritic pain  No itchy/red eyes No earache  No nausea No vomiting No abdominal pain No diarrhea  No skin rashes +  Fatigue Positive generalized myalgias Positive mild, generalized space headache, without photophobia or any focal neurologic symptoms.  He has been exposed to others at work with similar illness. His wife is a Engineer, civil (consulting) at [employer].   Past Medical History  Diagnosis Date  . Hypertension     Past Surgical History  Procedure Date  . Knee surgery     Family History  Problem Relation Age of Onset  . Adopted: Yes    History  Substance Use Topics  . Smoking status: Former Games developer  . Smokeless tobacco: Not on file  . Alcohol Use: Yes      Review of Systems  Respiratory: Positive for cough.   Neurological: Positive for headaches.   See further review of systems in the history of present illness. Otherwise, review of systems negative. Allergies  Antihistamines, chlorpheniramine-type  Home Medications   Current Outpatient Rx  Name  Route  Sig  Dispense  Refill  . LISINOPRIL 10 MG PO TABS   Oral   Take 10 mg by mouth  daily.         . AZITHROMYCIN 250 MG PO TABS      Use as directed   1 each   0   . PROMETHAZINE-CODEINE 6.25-10 MG/5ML PO SYRP      Take 1-2 teaspoons every 4-6 hours as needed for cough. May cause drowsiness.   120 mL   0     There were no vitals taken for this visit.  Physical Exam  Nursing note and vitals reviewed. Constitutional: He is oriented to person, place, and time. He appears well-developed and well-nourished. No distress.       He appears fatigued, but nontoxic. Occasional nonproductive cough noted.  HENT:  Head: Normocephalic and atraumatic.  Right Ear: Tympanic membrane normal.  Left Ear: Tympanic membrane normal.  Nose: Mucosal edema present. No sinus tenderness. Right sinus exhibits no maxillary sinus tenderness and no frontal sinus tenderness. Left sinus exhibits no maxillary sinus tenderness and no frontal sinus tenderness.  Mouth/Throat: Posterior oropharyngeal erythema (Minimal injection/erythema without tonsillar enlargement or exudate) present. No oropharyngeal exudate.  Eyes: Right eye exhibits no discharge. Left eye exhibits no discharge. No scleral icterus.  Neck: Neck supple.  Cardiovascular: Normal rate, regular rhythm and normal heart sounds.   Pulmonary/Chest: No respiratory distress. He has no wheezes. He has rhonchi. He has no rales.  Lymphadenopathy:    He has cervical adenopathy.       Right  cervical: Superficial cervical adenopathy present.       Left cervical: Superficial cervical adenopathy present.       Mild minimally tender anterior cervical lymphadenopathy  Neurological: He is alert and oriented to person, place, and time.  Skin: Skin is warm and dry.    ED Course  Procedures (including critical care time) Rapid flu test ordered from intranasal swabbed  Labs Reviewed  POCT INFLUENZA A/B   No results found.   1. Pharyngitis   2. Bronchitis, acute       MDM  Rapid flu test and strep test all negative. Clinically, he  has acute bronchitis with pharyngitis, possible bacterial cause. After risks, benefits, alternatives discussed, he agrees with the following treatment plan: Zithromax Z-Pak. Phenergan with codeine cough syrup prescribed with precautions discussed. Red flags discussed. Other OTC symptomatic care discussed. Rest, push fluids. Excused from work for 2 days starting tomorrow. Followup with PCP if no better one week, sooner if worse or new symptoms.        Lajean Manes, MD 12/17/11 [phone]

## 2011-12-17 NOTE — ED Notes (Signed)
States symptoms started Saturday morning with productive cough and headahe. T-max 101.6 this am

## 2011-12-19 ENCOUNTER — Other Ambulatory Visit: Payer: Self-pay | Admitting: Sports Medicine

## 2011-12-19 ENCOUNTER — Encounter: Payer: Self-pay | Admitting: Sports Medicine

## 2011-12-19 DIAGNOSIS — J111 Influenza due to unidentified influenza virus with other respiratory manifestations: Secondary | ICD-10-CM | POA: Insufficient documentation

## 2011-12-19 MED ORDER — OSELTAMIVIR PHOSPHATE 75 MG PO CAPS: 75.0000 mg | ORAL_CAPSULE | Freq: Two times a day (BID) | ORAL | Status: DC

## 2011-12-19 NOTE — Assessment & Plan Note (Signed)
Daughter and wife confirmed with a positive flu test. Treated with Tamiflu. We will treat Travis Huff as well. 12/19/2011

## 2011-12-22 ENCOUNTER — Emergency Department (INDEPENDENT_AMBULATORY_CARE_PROVIDER_SITE_OTHER): Payer: 59

## 2011-12-22 ENCOUNTER — Emergency Department
Admission: EM | Admit: 2011-12-22 | Discharge: 2011-12-22 | Disposition: A | Discharge status: Home / Self Care | Payer: 59 | Source: Home / Self Care | Attending: Family Medicine | Admitting: Family Medicine

## 2011-12-22 ENCOUNTER — Encounter: Payer: Self-pay | Admitting: Emergency Medicine

## 2011-12-22 DIAGNOSIS — R509 Fever, unspecified: Secondary | ICD-10-CM

## 2011-12-22 DIAGNOSIS — R9389 Abnormal findings on diagnostic imaging of other specified body structures: Secondary | ICD-10-CM

## 2011-12-22 DIAGNOSIS — R0989 Other specified symptoms and signs involving the circulatory and respiratory systems: Secondary | ICD-10-CM

## 2011-12-22 DIAGNOSIS — J45909 Unspecified asthma, uncomplicated: Secondary | ICD-10-CM

## 2011-12-22 DIAGNOSIS — R05 Cough: Secondary | ICD-10-CM

## 2011-12-22 DIAGNOSIS — R918 Other nonspecific abnormal finding of lung field: Secondary | ICD-10-CM

## 2011-12-22 LAB — POCT CBC W AUTO DIFF (K'VILLE URGENT CARE)

## 2011-12-22 MED ORDER — CEFDINIR 300 MG PO CAPS: 300.0000 mg | ORAL_CAPSULE | Freq: Two times a day (BID) | ORAL | Status: DC

## 2011-12-22 MED ORDER — ALBUTEROL SULFATE HFA 108 (90 BASE) MCG/ACT IN AERS: 2.0000 | INHALATION_SPRAY | RESPIRATORY_TRACT | Status: DC | PRN

## 2011-12-22 MED ORDER — BENZONATATE 200 MG PO CAPS: 200.0000 mg | ORAL_CAPSULE | Freq: Every day | ORAL | Status: DC

## 2011-12-22 MED ORDER — PREDNISONE 20 MG PO TABS: 20.0000 mg | ORAL_TABLET | Freq: Two times a day (BID) | ORAL | Status: DC

## 2011-12-22 NOTE — ED Notes (Signed)
Dry Cough, finished Z-Pak yesterday, still coughing not feeling better, fever, was seen here on Sunday

## 2011-12-22 NOTE — ED Provider Notes (Addendum)
History     CSN: [id number]  Arrival date & time 12/22/11  7253   First MD Initiated Contact with Patient 12/22/11 [phone]      Chief Complaint  Patient presents with  . Cough      HPI Comments: Patient complains of worsening cough despite finishing a Z-pack.  The cough is non-productive and worse at night.  He wheezes and has shortness of breath with activity.  He has a past history of mild asthma, and has tried using an old albuterol inhaler without relief.  He continues to have chills.  He has had pneumonia in the past.  The history is provided by the patient.    Past Medical History  Diagnosis Date  . Hypertension     Past Surgical History  Procedure Date  . Knee surgery     Family History  Problem Relation Age of Onset  . Adopted: Yes    History  Substance Use Topics  . Smoking status: Former Games developer  . Smokeless tobacco: Not on file  . Alcohol Use: Yes      Review of Systems + sore throat resolved + cough No pleuritic pain + wheezing + nasal congestion ? post-nasal drainage No sinus pain/pressure No itchy/red eyes No earache No hemoptysis + SOB with activity ? fever, + chills No nausea No vomiting No abdominal pain No diarrhea No urinary symptoms No skin rashes + fatigue No myalgias No headache Used OTC meds without relief  Allergies  Antihistamines, chlorpheniramine-type  Home Medications   Current Outpatient Rx  Name  Route  Sig  Dispense  Refill  . ALBUTEROL SULFATE HFA 108 (90 BASE) MCG/ACT IN AERS   Inhalation   Inhale 2 puffs into the lungs every 4 (four) hours as needed for wheezing.   1 Inhaler   0   . AZITHROMYCIN 250 MG PO TABS      Use as directed   1 each   0   . BENZONATATE 200 MG PO CAPS   Oral   Take 1 capsule (200 mg total) by mouth at bedtime. Take as needed for cough   12 capsule   0   . CEFDINIR 300 MG PO CAPS   Oral   Take 1 capsule (300 mg total) by mouth 2 (two) times daily.   20 capsule   0   .  LISINOPRIL 10 MG PO TABS   Oral   Take 10 mg by mouth daily.         . OSELTAMIVIR PHOSPHATE 75 MG PO CAPS   Oral   Take 1 capsule (75 mg total) by mouth 2 (two) times daily.   10 capsule   0   . PREDNISONE 20 MG PO TABS   Oral   Take 1 tablet (20 mg total) by mouth 2 (two) times daily.   10 tablet   0   . PROMETHAZINE-CODEINE 6.25-10 MG/5ML PO SYRP      Take 1-2 teaspoons every 4-6 hours as needed for cough. May cause drowsiness.   120 mL   0     BP 117/78  Pulse 85  Temp 98.2 F (36.8 C) (Oral)  Resp 16  Ht 6' (1.829 m)  Wt 290 lb (131.543 kg)  BMI 39.33 kg/m2  SpO2 95%  Physical Exam Nursing notes and Vital Signs reviewed. Appearance:  Patient appears stated age, and in no acute distress.  Patient is obese (BMI 39.3) Eyes:  Pupils are equal, round, and reactive to light and  accomodation.  Extraocular movement is intact.  Conjunctivae are not inflamed  Ears:  Canals normal.  Tympanic membranes normal.  Nose:  Mildly congested turbinates.  No sinus tenderness.   Pharynx:  Normal Neck:  Supple.  No adenopathy Lungs:  Scattered rhonchi posteriorly.  Breath sounds are equal.  Heart:  Regular rate and rhythm without murmurs, rubs, or gallops.  Abdomen:  Nontender without masses or hepatosplenomegaly.  Bowel sounds are present.  No CVA or flank tenderness.  Extremities:  No edema.  No calf tenderness Skin:  No rash present.   ED Course  Procedures  none   Labs Reviewed  POCT CBC W AUTO DIFF ([facility])  WBC 5.7; LY 24.2; MO 7.0; GR 68.8; Hgb 15.6; Platelets 139    Dg Chest 2 View  12/22/2011  *RADIOLOGY REPORT*  Clinical Data: Cough, congestion, and fever for 1 week, history hypertension  CHEST - 2 VIEW  Comparison: 04/04/2010  Findings: Normal heart size, mediastinal contours, and pulmonary vascularity. Enlargement of left hilum versus previous exam. Bronchitic changes without infiltrate, pleural effusion or pneumothorax. Bones unremarkable.   IMPRESSION: Bronchitic changes. Enlarged left hilum, cannot exclude left hilar adenopathy; recommend follow-up CT chest with contrast to exclude hilar adenopathy and perihilar mass.   Original Report Authenticated By: Ulyses Southward, M.D.      1. Asthmatic bronchitis  2. Abnormal finding on chest xray       MDM  Previous chart note and prior chest X-ray reviewed Begin Omnicef and prednisone burst.  Prescription written for Benzonatate (Tessalon) to take at bedtime for night-time cough.   Rx written for new albuterol inhaler. Take plain Mucinex (guaifenesin) twice daily for cough and congestion.  Increase fluid intake, rest. May use Afrin nasal spray (or generic oxymetazoline) twice daily for about 5 days.  Also recommend using saline nasal spray several times daily and saline nasal irrigation (AYR is a common brand) Stop all antihistamines for now, and other non-prescription cough/cold preparations. Recommend flu shot when well. Follow-up with family doctor in 10 days for repeat chest X-ray.        Lattie Haw, MD 12/22/11 1027  Lattie Haw, MD 12/22/11 1030

## 2011-12-25 ENCOUNTER — Telehealth: Payer: Self-pay | Admitting: Emergency Medicine

## 2012-01-26 ENCOUNTER — Encounter: Payer: Self-pay | Admitting: Sports Medicine

## 2012-01-26 ENCOUNTER — Ambulatory Visit (INDEPENDENT_AMBULATORY_CARE_PROVIDER_SITE_OTHER): Payer: 59 | Admitting: Sports Medicine

## 2012-01-26 VITALS — BP 129/78 | HR 85 | Wt 301.0 lb

## 2012-01-26 DIAGNOSIS — Z96659 Presence of unspecified artificial knee joint: Secondary | ICD-10-CM

## 2012-01-26 DIAGNOSIS — Z299 Encounter for prophylactic measures, unspecified: Secondary | ICD-10-CM

## 2012-01-26 DIAGNOSIS — N529 Male erectile dysfunction, unspecified: Secondary | ICD-10-CM

## 2012-01-26 DIAGNOSIS — I10 Essential (primary) hypertension: Secondary | ICD-10-CM

## 2012-01-26 DIAGNOSIS — Z96653 Presence of artificial knee joint, bilateral: Secondary | ICD-10-CM | POA: Insufficient documentation

## 2012-01-26 DIAGNOSIS — Z0000 Encounter for general adult medical examination without abnormal findings: Secondary | ICD-10-CM | POA: Insufficient documentation

## 2012-01-26 MED ORDER — SILDENAFIL CITRATE 100 MG PO TABS: 100.0000 mg | ORAL_TABLET | ORAL | Status: DC | PRN

## 2012-01-26 NOTE — Assessment & Plan Note (Signed)
Well-controlled, no changes. Checking kidney function.

## 2012-01-26 NOTE — Assessment & Plan Note (Signed)
Refill Viagra. Currently doing prostaglandin injections. Checking testosterone levels.

## 2012-01-26 NOTE — Progress Notes (Signed)
Subjective:    CC: Establish care.   HPI:  Erectile dysfunction: Currently on Viagra which is partially effective, he also does intracavernosal prostaglandins injections. He has had low testosterone in the past, has never gone through with replacement.  Hypertension: Well controlled on current medications.  Preventative measures: Declines influenza vaccine, Tdap was 7 years ago, needs colonoscopy referral.  Past medical history, Surgical history, Family history not pertinant except as noted below, Social history, Allergies, and medications have been entered into the medical record, reviewed, and no changes needed.   Review of Systems: No headache, visual changes, nausea, vomiting, diarrhea, constipation, dizziness, abdominal pain, skin rash, fevers, chills, night sweats, swollen lymph nodes, weight loss, chest pain, body aches, joint swelling, muscle aches, shortness of breath, mood changes, visual or auditory hallucinations.  Objective:    General: Well Developed, well nourished, and in no acute distress.  Neuro: Alert and oriented x3, extra-ocular muscles intact, sensation grossly intact.  HEENT: Normocephalic, atraumatic, pupils equal round reactive to light, neck supple, no masses, no lymphadenopathy, thyroid nonpalpable.  Skin: Warm and dry, no rashes noted.  Cardiac: Regular rate and rhythm, no murmurs rubs or gallops.  Respiratory: Clear to auscultation bilaterally. Not using accessory muscles, speaking in full sentences.  Abdominal: Soft, nontender, nondistended, positive bowel sounds, no masses, no organomegaly.  Musculoskeletal: Shoulder, elbow, wrist, hip, knee, ankle stable, and with full range of motion.  Impression and Recommendations:    The patient was counselled, risk factors were discussed, anticipatory guidance given.

## 2012-01-26 NOTE — Assessment & Plan Note (Signed)
Declines influenza vaccination. Colonoscopy referral.

## 2012-03-21 ENCOUNTER — Encounter: Payer: Self-pay | Admitting: Sports Medicine

## 2012-03-21 ENCOUNTER — Ambulatory Visit (INDEPENDENT_AMBULATORY_CARE_PROVIDER_SITE_OTHER): Payer: 59 | Admitting: Sports Medicine

## 2012-03-21 ENCOUNTER — Ambulatory Visit (HOSPITAL_BASED_OUTPATIENT_CLINIC_OR_DEPARTMENT_OTHER): Admission: RE | Admit: 2012-03-21 | Payer: 59 | Source: Ambulatory Visit

## 2012-03-21 ENCOUNTER — Ambulatory Visit (HOSPITAL_BASED_OUTPATIENT_CLINIC_OR_DEPARTMENT_OTHER)
Admission: RE | Admit: 2012-03-21 | Discharge: 2012-03-21 | Disposition: A | Discharge status: Home / Self Care | Payer: 59 | Source: Ambulatory Visit | Attending: Sports Medicine | Admitting: Sports Medicine

## 2012-03-21 ENCOUNTER — Ambulatory Visit: Payer: 59

## 2012-03-21 ENCOUNTER — Other Ambulatory Visit: Payer: Self-pay | Admitting: Sports Medicine

## 2012-03-21 VITALS — BP 141/97 | HR 86 | Wt 307.0 lb

## 2012-03-21 DIAGNOSIS — R05 Cough: Secondary | ICD-10-CM

## 2012-03-21 DIAGNOSIS — R059 Cough, unspecified: Secondary | ICD-10-CM | POA: Insufficient documentation

## 2012-03-21 DIAGNOSIS — I10 Essential (primary) hypertension: Secondary | ICD-10-CM

## 2012-03-21 MED ORDER — HYDROCOD POLST-CHLORPHEN POLST 10-8 MG/5ML PO LQCR: 5.0000 mL | Freq: Two times a day (BID) | ORAL | Status: DC | PRN

## 2012-03-21 MED ORDER — AZITHROMYCIN 250 MG PO TABS: ORAL_TABLET | ORAL | Status: DC

## 2012-03-21 MED ORDER — PREDNISONE 50 MG PO TABS: 50.0000 mg | ORAL_TABLET | Freq: Every day | ORAL | Status: DC

## 2012-03-21 NOTE — Assessment & Plan Note (Signed)
Blood pressure is elevated, but he is currently sick. Was well controlled previously on lisinopril 10. He does endorse this now taking lisinopril/Hydrochlorothiazide. I would like him to call us back letting us know exactly which medication he's taking. Follow up as needed.

## 2012-03-21 NOTE — Progress Notes (Signed)
Subjective:    CC: Cough  HPI: Cough: Productive of sputum, no blood, present for several days now, also having some tightness in the chest when taking deep breaths. Has had multiple sick contacts, no constitutional symptoms, no lower extremity swelling. No shortness of breath.  Hypertension: Was very well controlled with lisinopril 10 mg daily, is elevated today, and he is telling us he is taking lisinopril/hydrochlorothiazide. This does not match with what we have her records.  Past medical history, Surgical history, Family history not pertinant except as noted below, Social history, Allergies, and medications have been entered into the medical record, reviewed, and no changes needed.   Review of Systems: No fevers, chills, night sweats, weight loss, chest pain, or shortness of breath.   Objective:    General: Well Developed, well nourished, and in no acute distress.  Neuro: Alert and oriented x3, extra-ocular muscles intact, sensation grossly intact.  HEENT: Normocephalic, atraumatic, pupils equal round reactive to light, neck supple, no masses, no lymphadenopathy, thyroid nonpalpable.  Skin: Warm and dry, no rashes. Cardiac: Regular rate and rhythm, no murmurs rubs or gallops.  Respiratory: Bronchial sounds in the right lower lobe. Not using accessory muscles, speaking in full sentences.  Chest x-ray shows peribronchial thickening suggestive of bronchitis. Impression and Recommendations:

## 2012-03-21 NOTE — Assessment & Plan Note (Signed)
With abnormal right lower lobe sounds. Chest x-ray, prednisone, tussionex, azithromycin.

## 2012-03-22 ENCOUNTER — Telehealth: Payer: Self-pay | Admitting: *Deleted

## 2012-03-22 NOTE — Telephone Encounter (Signed)
LMOM for pt to return call. Bobie Caris, LPN  

## 2012-03-22 NOTE — Telephone Encounter (Signed)
Patient calls and states you needed to know what  Med he was taking and its the Lisinopril/HCTZ 12.5mg 

## 2012-03-22 NOTE — Telephone Encounter (Signed)
Thanks but ahhhh, there are 2 medicines in that pill, does he know the dose for the lisinopril part of the medicine?

## 2012-03-25 ENCOUNTER — Telehealth: Payer: Self-pay | Admitting: *Deleted

## 2012-03-25 NOTE — Telephone Encounter (Signed)
LMOM for pt to return call. Kimberly Gordon, LPN  

## 2012-04-01 ENCOUNTER — Telehealth: Payer: Self-pay | Admitting: *Deleted

## 2012-04-01 DIAGNOSIS — K921 Melena: Secondary | ICD-10-CM | POA: Insufficient documentation

## 2012-04-01 NOTE — Telephone Encounter (Signed)
LMOM for pt to return call for instruction. Barry Dienes, LPN

## 2012-04-01 NOTE — Telephone Encounter (Signed)
Pt notified of MD instructions. Merik Mignano, LPN  

## 2012-04-01 NOTE — Telephone Encounter (Signed)
Referral placed to gastroenterology. He should come in for some blood work which I will place an order for, CBC, coags to assess if he needs to go to the hospital now or not. If not anemic we can pursue this on an outpatient basis. Lab requisitions in my outbox.

## 2012-04-01 NOTE — Assessment & Plan Note (Signed)
GI referral. CBC, CMET, coags.

## 2012-04-01 NOTE — Telephone Encounter (Signed)
Pt calls and wants to get a referral to GI asap as he has had blood in stool over the weekend. Barry Dienes, LPN

## 2012-04-02 LAB — CBC
HCT: 43.7 % (ref 39.0–52.0)
Hemoglobin: 14.8 g/dL (ref 13.0–17.0)
MCH: 30.6 pg (ref 26.0–34.0)
MCHC: 33.9 g/dL (ref 30.0–36.0)
MCV: 90.5 fL (ref 78.0–100.0)
Platelets: 186 10*3/uL (ref 150–400)
RBC: 4.83 MIL/uL (ref 4.22–5.81)
RDW: 13.8 % (ref 11.5–15.5)
WBC: 10.3 10*3/uL (ref 4.0–10.5)

## 2012-04-02 LAB — COMPREHENSIVE METABOLIC PANEL
ALT: 23 U/L (ref 0–53)
AST: 23 U/L (ref 0–37)
Albumin: 3.8 g/dL (ref 3.5–5.2)
Alkaline Phosphatase: 68 U/L (ref 39–117)
BUN: 15 mg/dL (ref 6–23)
CO2: 25 mEq/L (ref 19–32)
Calcium: 9 mg/dL (ref 8.4–10.5)
Chloride: 100 mEq/L (ref 96–112)
Creat: 0.77 mg/dL (ref 0.50–1.35)
Glucose, Bld: 81 mg/dL (ref 70–99)
Potassium: 3.8 mEq/L (ref 3.5–5.3)
Sodium: 135 mEq/L (ref 135–145)
Total Bilirubin: 0.8 mg/dL (ref 0.3–1.2)
Total Protein: 6.4 g/dL (ref 6.0–8.3)

## 2012-04-02 LAB — PROTIME-INR
INR: 0.93 (ref <1.50)
Prothrombin Time: 12.5 seconds (ref 11.6–15.2)

## 2012-04-02 LAB — APTT: aPTT: 34 seconds (ref 24–37)

## 2012-04-04 ENCOUNTER — Encounter: Payer: Self-pay | Admitting: Sports Medicine

## 2012-04-04 ENCOUNTER — Ambulatory Visit (INDEPENDENT_AMBULATORY_CARE_PROVIDER_SITE_OTHER): Payer: 59 | Admitting: Sports Medicine

## 2012-04-04 VITALS — BP 138/86 | HR 81 | Wt 307.0 lb

## 2012-04-04 DIAGNOSIS — K921 Melena: Secondary | ICD-10-CM

## 2012-04-04 DIAGNOSIS — I10 Essential (primary) hypertension: Secondary | ICD-10-CM

## 2012-04-04 MED ORDER — DOCUSATE SODIUM 100 MG PO CAPS: 100.0000 mg | ORAL_CAPSULE | Freq: Three times a day (TID) | ORAL | Status: DC | PRN

## 2012-04-04 MED ORDER — LISINOPRIL-HYDROCHLOROTHIAZIDE 20-25 MG PO TABS: 1.0000 | ORAL_TABLET | Freq: Every day | ORAL | Status: DC

## 2012-04-04 NOTE — Patient Instructions (Addendum)

## 2012-04-04 NOTE — Assessment & Plan Note (Signed)
Sounds to be internal hemorrhoids. Colace daily. I have already placed a referral to gastroenterology per patient request.

## 2012-04-04 NOTE — Assessment & Plan Note (Signed)
Doing much better. I am increasing his medication to lisinopril/hydrochlorothiazide 20/25. I'm giving him some Benicar/HCT 40/25 mg samples, he will switch to the lisinopril hydrochlorothiazide as soon as he can fill the medication. I'll see him back in 4 weeks.

## 2012-04-04 NOTE — Progress Notes (Signed)
  Subjective:    CC: Followup  HPI: Hypertension: Much better control. Does need a sample as he has currently run out.  Hematochezia: Painless, happened after straining on the toilet, has had an episode before, but is very infrequent. He denies constipation. Denies any abdominal pain. He does have a friend who died of colon cancer at an early age in his 72s and he is asked that we provide a referral to gastroenterology for colonoscopy. I think that this is appropriate. Symptoms have since resolved, and a CBC did not show anemia.  Past medical history, Surgical history, Family history not pertinant except as noted below, Social history, Allergies, and medications have been entered into the medical record, reviewed, and no changes needed.   Review of Systems: No fevers, chills, night sweats, weight loss, chest pain, or shortness of breath.   Objective:    General: Well Developed, well nourished, and in no acute distress.  Neuro: Alert and oriented x3, extra-ocular muscles intact, sensation grossly intact.  HEENT: Normocephalic, atraumatic, pupils equal round reactive to light, neck supple, no masses, no lymphadenopathy, thyroid nonpalpable.  Skin: Warm and dry, no rashes. Cardiac: Regular rate and rhythm, no murmurs rubs or gallops.  Respiratory: Clear to auscultation bilaterally. Not using accessory muscles, speaking in full sentences. Abdomen: Soft, nontender, nondistended, normal bowel sounds, no palpable masses.  Impression and Recommendations:

## 2012-04-17 ENCOUNTER — Encounter: Payer: Self-pay | Admitting: Internal Medicine

## 2012-04-22 ENCOUNTER — Telehealth: Payer: Self-pay | Admitting: *Deleted

## 2012-04-22 DIAGNOSIS — Z1211 Encounter for screening for malignant neoplasm of colon: Secondary | ICD-10-CM

## 2012-04-22 NOTE — Telephone Encounter (Signed)
Referral placed.

## 2012-04-22 NOTE — Telephone Encounter (Signed)
Patient calls and states that you put in a referral to GI for colonoscopy to [place] but his wife goes to [facility] and sees Dr. Dulce Sellar and he would like to see him and needs the referral sent over to that office.

## 2012-05-02 ENCOUNTER — Ambulatory Visit: Payer: 59 | Admitting: Sports Medicine

## 2012-05-22 ENCOUNTER — Encounter (HOSPITAL_BASED_OUTPATIENT_CLINIC_OR_DEPARTMENT_OTHER): Payer: Self-pay | Admitting: *Deleted

## 2012-05-22 ENCOUNTER — Observation Stay (HOSPITAL_BASED_OUTPATIENT_CLINIC_OR_DEPARTMENT_OTHER)
Admission: EM | Admit: 2012-05-22 | Discharge: 2012-05-24 | Disposition: A | Discharge status: Home / Self Care | Payer: 59 | Attending: Cardiology | Admitting: Cardiology

## 2012-05-22 ENCOUNTER — Other Ambulatory Visit: Payer: Self-pay

## 2012-05-22 ENCOUNTER — Emergency Department
Admission: EM | Admit: 2012-05-22 | Discharge: 2012-05-22 | Disposition: A | Discharge status: Home / Self Care | Payer: 59 | Source: Home / Self Care | Attending: Emergency Medicine | Admitting: Emergency Medicine

## 2012-05-22 ENCOUNTER — Telehealth: Payer: Self-pay | Admitting: *Deleted

## 2012-05-22 ENCOUNTER — Emergency Department (HOSPITAL_BASED_OUTPATIENT_CLINIC_OR_DEPARTMENT_OTHER): Payer: 59

## 2012-05-22 ENCOUNTER — Encounter: Payer: Self-pay | Admitting: *Deleted

## 2012-05-22 DIAGNOSIS — I519 Heart disease, unspecified: Secondary | ICD-10-CM | POA: Insufficient documentation

## 2012-05-22 DIAGNOSIS — R079 Chest pain, unspecified: Secondary | ICD-10-CM

## 2012-05-22 DIAGNOSIS — R0602 Shortness of breath: Secondary | ICD-10-CM

## 2012-05-22 DIAGNOSIS — R072 Precordial pain: Principal | ICD-10-CM | POA: Diagnosis present

## 2012-05-22 DIAGNOSIS — I10 Essential (primary) hypertension: Secondary | ICD-10-CM | POA: Diagnosis present

## 2012-05-22 HISTORY — DX: Viral pericarditis: B33.23

## 2012-05-22 LAB — CBC WITH DIFFERENTIAL/PLATELET
Basophils Absolute: 0 10*3/uL (ref 0.0–0.1)
Basophils Relative: 0 % (ref 0–1)
Eosinophils Absolute: 0.1 10*3/uL (ref 0.0–0.7)
Eosinophils Relative: 2 % (ref 0–5)
HCT: 44.8 % (ref 39.0–52.0)
Hemoglobin: 15.8 g/dL (ref 13.0–17.0)
Lymphocytes Relative: 14 % (ref 12–46)
Lymphs Abs: 1.3 10*3/uL (ref 0.7–4.0)
MCH: 31.9 pg (ref 26.0–34.0)
MCHC: 35.3 g/dL (ref 30.0–36.0)
MCV: 90.3 fL (ref 78.0–100.0)
Monocytes Absolute: 0.9 10*3/uL (ref 0.1–1.0)
Monocytes Relative: 10 % (ref 3–12)
Neutro Abs: 6.9 10*3/uL (ref 1.7–7.7)
Neutrophils Relative %: 74 % (ref 43–77)
Platelets: 181 10*3/uL (ref 150–400)
RBC: 4.96 MIL/uL (ref 4.22–5.81)
RDW: 12.8 % (ref 11.5–15.5)
WBC: 9.2 10*3/uL (ref 4.0–10.5)

## 2012-05-22 LAB — COMPREHENSIVE METABOLIC PANEL
ALT: 30 U/L (ref 0–53)
AST: 40 U/L — ABNORMAL HIGH (ref 0–37)
Albumin: 4.1 g/dL (ref 3.5–5.2)
Alkaline Phosphatase: 73 U/L (ref 39–117)
BUN: 22 mg/dL (ref 6–23)
CO2: 25 mEq/L (ref 19–32)
Calcium: 9.4 mg/dL (ref 8.4–10.5)
Chloride: 99 mEq/L (ref 96–112)
Creatinine, Ser: 0.9 mg/dL (ref 0.50–1.35)
GFR calc Af Amer: >90 mL/min (ref >90)
GFR calc non Af Amer: >90 mL/min (ref >90)
Glucose, Bld: 99 mg/dL (ref 70–99)
Potassium: 3.8 mEq/L (ref 3.5–5.1)
Sodium: 137 mEq/L (ref 135–145)
Total Bilirubin: 0.4 mg/dL (ref 0.3–1.2)
Total Protein: 7.2 g/dL (ref 6.0–8.3)

## 2012-05-22 LAB — TROPONIN I
Troponin I: <0.3 ng/mL (ref <0.30)
Troponin I: <0.3 ng/mL (ref <0.30)

## 2012-05-22 MED ORDER — ONDANSETRON HCL 4 MG/2ML IJ SOLN: 4.0000 mg | Freq: Four times a day (QID) | INTRAMUSCULAR | Status: DC | PRN

## 2012-05-22 MED ORDER — ATORVASTATIN CALCIUM 80 MG PO TABS
80.0000 mg | ORAL_TABLET | Freq: Every day | ORAL | Status: DC
  Administered 2012-05-22 – 2012-05-23 (×2): 80 mg via ORAL
  Filled 2012-05-22 (×3): qty 1

## 2012-05-22 MED ORDER — HEPARIN BOLUS VIA INFUSION
4000.0000 [IU] | Freq: Once | INTRAVENOUS | Status: AC
  Administered 2012-05-22: 4000 [IU] via INTRAVENOUS
  Filled 2012-05-22: qty 4000

## 2012-05-22 MED ORDER — LISINOPRIL 20 MG PO TABS
20.0000 mg | ORAL_TABLET | Freq: Every day | ORAL | Status: DC
  Administered 2012-05-23 – 2012-05-24 (×2): 20 mg via ORAL
  Filled 2012-05-22 (×2): qty 1

## 2012-05-22 MED ORDER — HEPARIN (PORCINE) IN NACL 100-0.45 UNIT/ML-% IJ SOLN
1800.0000 [IU]/h | INTRAMUSCULAR | Status: DC
  Administered 2012-05-22 – 2012-05-24 (×3): 1600 [IU]/h via INTRAVENOUS
  Filled 2012-05-22 (×4): qty 250

## 2012-05-22 MED ORDER — NITROGLYCERIN 0.4 MG SL SUBL: 0.4000 mg | SUBLINGUAL_TABLET | SUBLINGUAL | Status: DC | PRN

## 2012-05-22 MED ORDER — ACETAMINOPHEN 325 MG PO TABS: 650.0000 mg | ORAL_TABLET | ORAL | Status: DC | PRN

## 2012-05-22 MED ORDER — ALBUTEROL SULFATE HFA 108 (90 BASE) MCG/ACT IN AERS: 2.0000 | INHALATION_SPRAY | RESPIRATORY_TRACT | Status: DC | PRN

## 2012-05-22 MED ORDER — LISINOPRIL-HYDROCHLOROTHIAZIDE 20-25 MG PO TABS: 1.0000 | ORAL_TABLET | Freq: Every day | ORAL | Status: DC

## 2012-05-22 MED ORDER — HYDROCHLOROTHIAZIDE 25 MG PO TABS
25.0000 mg | ORAL_TABLET | Freq: Every day | ORAL | Status: DC
  Administered 2012-05-23 – 2012-05-24 (×2): 25 mg via ORAL
  Filled 2012-05-22 (×2): qty 1

## 2012-05-22 MED ORDER — ASPIRIN 81 MG PO CHEW
324.0000 mg | CHEWABLE_TABLET | ORAL | Status: AC
  Administered 2012-05-22: 324 mg via ORAL
  Filled 2012-05-22: qty 4

## 2012-05-22 MED ORDER — ASPIRIN EC 81 MG PO TBEC
81.0000 mg | DELAYED_RELEASE_TABLET | Freq: Every day | ORAL | Status: DC
  Administered 2012-05-23 – 2012-05-24 (×2): 81 mg via ORAL
  Filled 2012-05-22 (×2): qty 1

## 2012-05-22 NOTE — ED Notes (Signed)
MD at bedside. 

## 2012-05-22 NOTE — ED Notes (Addendum)
Pt reports chest " pressure" this am while at work SOb also, sen t here for eval from [facility]

## 2012-05-22 NOTE — ED Provider Notes (Signed)
History     CSN: [id number]  Arrival date & time 05/22/12  1634   First MD Initiated Contact with Patient 05/22/12 1718      Chief Complaint  Patient presents with  . Chest Pain  . Shortness of Breath  . Excessive Sweating    (Consider location/radiation/quality/duration/timing/severity/associated sxs/prior treatment) HPI  This is a 52 year old white male who comes in today complaining of chest pain since earlier today.  He describes as a pressure not necessarily pain.  It has radiated somewhat down to his left arm.  He has had some excessive sweating and a nervous feeling today as well.  He has not been taking any medicines for this particular problem.  His wife is here with him and she is a cardiac nurse at [employer].  He does note that he is having an excessive amount of stress at work this week and he states this is starting to affect him.  He also has a history of pericarditis about 10 years ago and was a smoker up to that point.  He is adopted so he does not know his family history.  The discomfort is intermittent and sitting here does not seem to bother him very much.   Past Medical History  Diagnosis Date  . Hypertension   . Asthma     Past Surgical History  Procedure Laterality Date  . Knee surgery    . Tendon repair      right hand    Family History  Problem Relation Age of Onset  . Adopted: Yes    History  Substance Use Topics  . Smoking status: Former Games developer  . Smokeless tobacco: Not on file  . Alcohol Use: Yes     Comment: 2 beers QD      Review of Systems  All other systems reviewed and are negative.    Allergies  Other  Home Medications   Current Outpatient Rx  Name  Route  Sig  Dispense  Refill  . albuterol (PROVENTIL HFA;VENTOLIN HFA) 108 (90 BASE) MCG/ACT inhaler   Inhalation   Inhale 2 puffs into the lungs every 4 (four) hours as needed for wheezing.   1 Inhaler   0   . docusate sodium (COLACE) 100 MG capsule   Oral   Take 1  capsule (100 mg total) by mouth 3 (three) times daily as needed for constipation.   90 capsule   3   . lisinopril-hydrochlorothiazide (PRINZIDE,ZESTORETIC) 20-25 MG per tablet   Oral   Take 1 tablet by mouth daily.   90 tablet   3   . sildenafil (VIAGRA) 100 MG tablet   Oral   Take 1 tablet (100 mg total) by mouth as needed for erectile dysfunction (for use prior to sexual activity).   30 tablet   0     BP 140/99  Pulse 90  Temp(Src) 98.4 F (36.9 C) (Oral)  Resp 18  Ht 5\' 11"  (1.803 m)  Wt 311 lb (141.069 kg)  BMI 43.4 kg/m2  SpO2 98%  Physical Exam  Nursing note and vitals reviewed. Constitutional: He is oriented to person, place, and time. Vital signs are normal. He appears well-developed and well-nourished.  Obese  HENT:  Head: Normocephalic and atraumatic.  Eyes: No scleral icterus.  Neck: Neck supple.  Cardiovascular: Normal rate, regular rhythm, normal heart sounds and normal pulses.  PMI is not displaced.  Exam reveals no friction rub.   No murmur heard. Pulses:  Carotid pulses are 2+ on the right side, and 2+ on the left side.      Radial pulses are 2+ on the right side, and 2+ on the left side.  Pulmonary/Chest: Effort normal and breath sounds normal. No respiratory distress. He has no decreased breath sounds. He has no wheezes. He has no rhonchi.  Neurological: He is alert and oriented to person, place, and time. No cranial nerve deficit. GCS eye subscore is 4. GCS verbal subscore is 5. GCS motor subscore is 6.  Skin: Skin is warm and dry.  Psychiatric: He has a normal mood and affect. His speech is normal and behavior is normal. Thought content normal.    ED Course  Procedures (including critical care time)  Labs Reviewed - No data to display No results found.   1. Chest pain       MDM    We quickly triaged patient and to the back and got an EKG.  EKG did show some flipped T waves and incomplete bundle branch block with a PVC.  But I don't  see any ST elevations.  ASA.  Nitroglycerin not given since he uses Viagra.  After hearing his history and symptoms, I discussed with the wife and felt that the proper thing to do would be for him to go to immediately to the emergency room for cardiac enzymes and telemetry monitoring.   I feel his wife will be able to get into the hospital faster so we will take that route instead of transporting him.  I discussed the patient at this could be anxiety only, however with his classic symptoms and some minor EKG changes I think is extremely appropriate for him to be evaluated by the emergency room.  We will call the ER ito let them know he is on his way.  If they find that this is just anxiety, he should call his regular physician to discuss that especially if he is having are time dealing with the stress.  5:27pm: Confirmed that the patient is safely to the [facility] ER and they are currently triaging him.   Marlaine Hind, MD 05/22/12 [phone]

## 2012-05-22 NOTE — H&P (Signed)
Physician History and Physical    Travis Huff MRN: [id number] DOB/AGE: [birth year] 51 y.o. Admit date: 05/22/2012  Primary Cardiologist:  Saw Dr. Patty Sermons 12 years ago for pericarditis  CC:  Chest pain  HPI:  Pt is a 52 yo man prior smoker with HTN who presents with chest pain today.  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  The pressure was assoc with diaphoresis and nausea and lasted about 10 mins.  He continued to work and had another episode of similar sx about an hour later.  The pain also lasted for about 10 mins.  He is under a lot of stress at work and feels that this may be contributing to his sx.  He went to lunch and then later in the day continued to have a left shoulder ache.  He denies any pain at this time.  He denies PND, orthopnea, LE edema, syncope.  He did have an episode of viral pericarditis about 12 years ago, but the pain he had at that time felt much different than this.  He does not exercise on a regular basis.  He has no other acute complaints.   Review of systems: A review of 10 organ systems was done and is negative except as stated above in HPI  Past Medical History  Diagnosis Date  . Hypertension   . Asthma   . Viral pericarditis    Past Surgical History  Procedure Laterality Date  . Knee surgery    . Tendon repair      right hand   History   Social History  . Marital Status: Married    Spouse Name: N/A    Number of Children: N/A  . Years of Education: N/A   Occupational History  . Not on file.   Social History Main Topics  . Smoking status: Former Smoker -  Prior 30 pack year smoker, quit 12 years ago  . Smokeless tobacco: Not on file  . Alcohol Use: Yes     Comment: 2 beers QD  . Drug Use: No  . Sexually Active: Not on file   Other Topics Concern  . Not on file   Social History Narrative  . No narrative on file    Family History  Problem Relation Age of  Onset  . Adopted: Yes     Allergies  Allergen Reactions  . Other     Pain meds- unknown name to pt    Prescriptions prior to admission  Medication Sig Dispense Refill  . lisinopril-hydrochlorothiazide (PRINZIDE,ZESTORETIC) 20-25 MG per tablet Take 1 tablet by mouth daily.  90 tablet  3  . sildenafil (VIAGRA) 100 MG tablet Take 1 tablet (100 mg total) by mouth as needed for erectile dysfunction (for use prior to sexual activity).  30 tablet  0  . [DISCONTINUED] albuterol (PROVENTIL HFA;VENTOLIN HFA) 108 (90 BASE) MCG/ACT inhaler Inhale 2 puffs into the lungs every 4 (four) hours as needed for wheezing.  1 Inhaler  0  . [DISCONTINUED] docusate sodium (COLACE) 100 MG capsule Take 1 capsule (100 mg total) by mouth 3 (three) times daily as needed for constipation.  90 capsule  3    No current facility-administered medications for this encounter.  Physical Exam: Blood pressure 145/83, pulse 82, temperature 98 F (36.7 C), temperature source Oral, resp. rate 18, height 5\' 11"  (1.803 m), weight 138.438 kg (305 lb 3.2 oz), SpO2 94.00%.;  Body mass index is 42.59 kg/(m^2). Temp:  [98 F (36.7 C)-99.3 F (37.4 C)] 98 F (36.7 C) (05/21 2102) Pulse Rate:  [82-90] 82 (05/21 2102) Resp:  [17-22] 18 (05/21 2102) BP: (123-148)/(69-99) 145/83 mmHg (05/21 2102) SpO2:  [94 %-98 %] 94 % (05/21 2102) Weight:  [138.347 kg (305 lb)-141.069 kg (311 lb)] 138.438 kg (305 lb 3.2 oz) (05/21 2102)  No intake or output data in the 24 hours ending 05/22/12 2140 General: NAD Heent: MMM Neck: No JVD  CV: Nondisplaced PMI.  RRR, nl S1/S2, no S3/S4, no murmur.  Lungs: Clear to auscultation bilaterally with normal respiratory effort Abdomen: Soft, nontender, nondistended Extremities: No clubbing or cyanosis.  No pedal edema Skin: Intact without lesions or rashes  Neurologic: Alert and oriented x 3, grossly nonfocal  Psych: Normal mood and affect    Labs:  Recent Labs  05/22/12 1744  TROPONINI <0.30    Lab Results  Component Value Date   WBC 9.2 05/22/2012   HGB 15.8 05/22/2012   HCT 44.8 05/22/2012   MCV 90.3 05/22/2012   PLT 181 05/22/2012    Recent Labs Lab 05/22/12 1744  NA 137  K 3.8  CL 99  CO2 25  BUN 22  CREATININE 0.90  CALCIUM 9.4  PROT 7.2  BILITOT 0.4  ALKPHOS 73  ALT 30  AST 40*  GLUCOSE 99     EKG:  Sinus, no acute ischemic changes  Radiology:  Dg Chest 2 View  05/22/2012   *RADIOLOGY REPORT*  Clinical Data: Chest pain  CHEST - 2 VIEW  Comparison: 03/21/2012  Findings: Heart size is upper normal.  Negative for heart failure. Lungs are free of infiltrate effusion or mass.  IMPRESSION: No active cardiopulmonary abnormality.   Original Report Authenticated By: Janeece Riggers, M.D.    ASSESSMENT: Pt is a 52 yo man prior smoker with HTN who presents with chest pain today.   PLAN: Chest pain - pain sounds typical in description and is concerning for unstable angina.  His cardiac risk factors include prior heavy smoker, HTN, age, male gender.  EKG with no acute ischemic changes and first trop negative - trend enzymes - start heparin - aspirin, statin - check lipids and echo - daily ekgs - NPO for cath in am  HTN - continue home meds  Ppx - heparin gtt  Dispo - admit to [facility]  Signed: Hilary Hertz, MD Cardiology Fellow 05/22/2012, 9:40 PM

## 2012-05-22 NOTE — ED Notes (Signed)
Pt c/o center of chest pressure and LT arm pain earlier today, with excessive sweating and nervous feeling x today. He reports increased work stress. No OTC meds.

## 2012-05-22 NOTE — ED Provider Notes (Signed)
History     CSN: [id number]  Arrival date & time 05/22/12  1723   First MD Initiated Contact with Patient 05/22/12 1733      Chief Complaint  Patient presents with  . Chest Pain    (Consider location/radiation/quality/duration/timing/severity/associated sxs/prior treatment) HPI Pt with no previous CAD or prior workup, history of 30 pack year of cigarette smoking quit 12 years ago, hx of HTN, present with 1 day of chest pressure radiating up to left shoulder associated with sweating, diaphoresis, SOB and anxiety. Symptoms have now completely resolved. Chest pressure eased off at 1330 today. Unknown family history. +increasing stress at work. No recent travel or surgery. No calf swelling or pain. No fever, cough, wheezing. No prior diagnosis or depression or anxiety d/o.  Past Medical History  Diagnosis Date  . Hypertension   . Asthma   . Viral pericarditis     Past Surgical History  Procedure Laterality Date  . Knee surgery    . Tendon repair      right hand    Family History  Problem Relation Age of Onset  . Adopted: Yes    History  Substance Use Topics  . Smoking status: Former Games developer  . Smokeless tobacco: Not on file  . Alcohol Use: Yes     Comment: 2 beers QD      Review of Systems  Constitutional: Positive for diaphoresis. Negative for fever and chills.  HENT: Negative for neck pain and neck stiffness.   Respiratory: Positive for chest tightness and shortness of breath. Negative for cough and wheezing.   Cardiovascular: Positive for chest pain. Negative for palpitations and leg swelling.  Gastrointestinal: Positive for nausea. Negative for vomiting, abdominal pain and diarrhea.  Musculoskeletal: Negative for myalgias and back pain.  Skin: Negative for rash and wound.  Neurological: Negative for dizziness, syncope, weakness, light-headedness, numbness and headaches.  Psychiatric/Behavioral: The patient is nervous/anxious.   All other systems reviewed and are  negative.    Allergies  Other  Home Medications   No current outpatient prescriptions on file.  BP 123/51  Pulse 71  Temp(Src) 98.3 F (36.8 C) (Oral)  Resp 20  Ht 5\' 11"  (1.803 m)  Wt 305 lb 3.2 oz (138.438 kg)  BMI 42.59 kg/m2  SpO2 100%  Physical Exam  Nursing note and vitals reviewed. Constitutional: He is oriented to person, place, and time. He appears well-developed and well-nourished. No distress.  Anxious appearing  HENT:  Head: Normocephalic and atraumatic.  Mouth/Throat: Oropharynx is clear and moist. No oropharyngeal exudate.  Eyes: EOM are normal. Pupils are equal, round, and reactive to light.  Neck: Normal range of motion. Neck supple.  Cardiovascular: Normal rate and regular rhythm.  Exam reveals no gallop and no friction rub.   No murmur heard. Pulmonary/Chest: Effort normal and breath sounds normal. No respiratory distress. He has no wheezes. He has no rales. He exhibits no tenderness.  Abdominal: Soft. Bowel sounds are normal. He exhibits no distension and no mass. There is no tenderness. There is no rebound and no guarding.  Musculoskeletal: Normal range of motion. He exhibits no edema and no tenderness.  No calf swelling or tenderness  Neurological: He is alert and oriented to person, place, and time.  Skin: Skin is warm and dry. No rash noted. No erythema.    ED Course  Procedures (including critical care time)  Labs Reviewed  COMPREHENSIVE METABOLIC PANEL - Abnormal; Notable for the following:    AST 40 (*)  All other components within normal limits  BASIC METABOLIC PANEL - Abnormal; Notable for the following:    Glucose, Bld 108 (*)    All other components within normal limits  LIPID PANEL - Abnormal; Notable for the following:    LDL Cholesterol 104 (*)    All other components within normal limits  TROPONIN I  CBC WITH DIFFERENTIAL  TROPONIN I  TROPONIN I  TROPONIN I  CBC  PROTIME-INR  HEPARIN LEVEL (UNFRACTIONATED)  HEPARIN LEVEL  (UNFRACTIONATED)  CBC   Dg Chest 2 View  05/22/2012   *RADIOLOGY REPORT*  Clinical Data: Chest pain  CHEST - 2 VIEW  Comparison: 03/21/2012  Findings: Heart size is upper normal.  Negative for heart failure. Lungs are free of infiltrate effusion or mass.  IMPRESSION: No active cardiopulmonary abnormality.   Original Report Authenticated By: Janeece Riggers, M.D.     1. Chest pain      Date: 05/22/2012  Rate: 86  Rhythm: normal sinus rhythm  QRS Axis: indeterminate  Intervals: normal  ST/T Wave abnormalities: nonspecific T wave changes  Conduction Disutrbances:none  Narrative Interpretation:   Old EKG Reviewed: none available Occasional PVC's   MDM   Discussed with Dr Rex Kras. Suggested transfer to [facility] tele bed for rule out and provocative testing. Pt and wife in agreement with plan. Pt remains symptom-free currently       Loren Racer, MD 05/23/12 [phone]

## 2012-05-22 NOTE — Progress Notes (Addendum)
ANTICOAGULATION CONSULT NOTE - Initial Consult  Pharmacy Consult for Heparin Indication: chest pain/ACS  Allergies  Allergen Reactions  . Other     Pain meds- unknown name to pt    Patient Measurements: Height: 5\' 11"  (180.3 cm) Weight: 305 lb 3.2 oz (138.438 kg) IBW/kg (Calculated) : 75.3 Heparin Dosing Weight: 100 kg  Vital Signs: Temp: 98.2 F (36.8 C) (05/22 0455) Temp src: Oral (05/22 0455) BP: 125/79 mmHg (05/22 0455) Pulse Rate: 69 (05/22 0455)  Labs:  Recent Labs  05/22/12 1744 05/22/12 2309 05/23/12 0610  HGB 15.8  --  15.0  HCT 44.8  --  43.5  PLT 181  --  172  LABPROT  --   --  12.7  INR  --   --  0.96  HEPARINUNFRC  --   --  0.54  CREATININE 0.90  --   --   TROPONINI <0.30 <0.30  --     Estimated Creatinine Clearance: 138 ml/min (by C-G formula based on Cr of 0.9).   Medical History: Past Medical History  Diagnosis Date  . Hypertension   . Asthma   . Viral pericarditis     Medications:  Prescriptions prior to admission  Medication Sig Dispense Refill  . lisinopril-hydrochlorothiazide (PRINZIDE,ZESTORETIC) 20-25 MG per tablet Take 1 tablet by mouth daily.  90 tablet  3  . sildenafil (VIAGRA) 100 MG tablet Take 1 tablet (100 mg total) by mouth as needed for erectile dysfunction (for use prior to sexual activity).  30 tablet  0  . [DISCONTINUED] albuterol (PROVENTIL HFA;VENTOLIN HFA) 108 (90 BASE) MCG/ACT inhaler Inhale 2 puffs into the lungs every 4 (four) hours as needed for wheezing.  1 Inhaler  0  . [DISCONTINUED] docusate sodium (COLACE) 100 MG capsule Take 1 capsule (100 mg total) by mouth 3 (three) times daily as needed for constipation.  90 capsule  3    Assessment: 52 yo male with chest pain for heparin  Goal of Therapy:  Heparin level 0.3-0.7 units/ml Monitor platelets by anticoagulation protocol: Yes   Plan:  Heparin 4000 units IV bolus, then 1600 units/hr Check heparin level in 6 hours.   Cylah Fannin, Gary Fleet 05/23/2012,6:35 AM  Addendum: Initial level 0.54  No change to heparin infusion.  Geannie Risen, PharmD, BCPS

## 2012-05-23 ENCOUNTER — Other Ambulatory Visit: Payer: Self-pay

## 2012-05-23 ENCOUNTER — Inpatient Hospital Stay (HOSPITAL_COMMUNITY): Payer: 59

## 2012-05-23 DIAGNOSIS — I517 Cardiomegaly: Secondary | ICD-10-CM

## 2012-05-23 DIAGNOSIS — R079 Chest pain, unspecified: Secondary | ICD-10-CM

## 2012-05-23 LAB — CBC
HCT: 43.5 % (ref 39.0–52.0)
Hemoglobin: 15 g/dL (ref 13.0–17.0)
MCH: 31.4 pg (ref 26.0–34.0)
MCHC: 34.5 g/dL (ref 30.0–36.0)
MCV: 91.2 fL (ref 78.0–100.0)
Platelets: 172 10*3/uL (ref 150–400)
RBC: 4.77 MIL/uL (ref 4.22–5.81)
RDW: 13.1 % (ref 11.5–15.5)
WBC: 6 10*3/uL (ref 4.0–10.5)

## 2012-05-23 LAB — BASIC METABOLIC PANEL
BUN: 16 mg/dL (ref 6–23)
CO2: 27 mEq/L (ref 19–32)
Calcium: 8.8 mg/dL (ref 8.4–10.5)
Chloride: 100 mEq/L (ref 96–112)
Creatinine, Ser: 0.97 mg/dL (ref 0.50–1.35)
GFR calc Af Amer: >90 mL/min (ref >90)
GFR calc non Af Amer: >90 mL/min (ref >90)
Glucose, Bld: 108 mg/dL — ABNORMAL HIGH (ref 70–99)
Potassium: 4.1 mEq/L (ref 3.5–5.1)
Sodium: 138 mEq/L (ref 135–145)

## 2012-05-23 LAB — LIPID PANEL
Cholesterol: 187 mg/dL (ref 0–200)
HDL: 68 mg/dL (ref >39)
LDL Cholesterol: 104 mg/dL — ABNORMAL HIGH (ref 0–99)
Total CHOL/HDL Ratio: 2.8 RATIO
Triglycerides: 77 mg/dL (ref <150)
VLDL: 15 mg/dL (ref 0–40)

## 2012-05-23 LAB — PROTIME-INR
INR: 0.96 (ref 0.00–1.49)
Prothrombin Time: 12.7 seconds (ref 11.6–15.2)

## 2012-05-23 LAB — TROPONIN I
Troponin I: <0.3 ng/mL (ref <0.30)
Troponin I: <0.3 ng/mL (ref <0.30)

## 2012-05-23 LAB — HEPARIN LEVEL (UNFRACTIONATED): Heparin Unfractionated: 0.54 IU/mL (ref 0.30–0.70)

## 2012-05-23 MED ORDER — TECHNETIUM TC 99M SESTAMIBI GENERIC - CARDIOLITE
30.0000 | Freq: Once | INTRAVENOUS | Status: AC | PRN
  Administered 2012-05-23: 30 via INTRAVENOUS

## 2012-05-23 NOTE — Progress Notes (Signed)
Echocardiogram 2D Echocardiogram has been performed.  Ivon Roedel 05/23/2012, 11:44 AM

## 2012-05-23 NOTE — Progress Notes (Signed)
   Patient Name: Travis Huff Date of Encounter: 05/23/2012  Active Problems:   * No active hospital problems. *    SUBJECTIVE: No chest pain  OBJECTIVE Filed Vitals:   05/22/12 1900 05/22/12 1904 05/22/12 2102 05/23/12 0455  BP: 123/69  145/83 125/79  Pulse: 84  82 69  Temp:  98.5 F (36.9 C) 98 F (36.7 C) 98.2 F (36.8 C)  TempSrc:  Oral Oral Oral  Resp: 17  18 18   Height:   5\' 11"  (1.803 m)   Weight:   305 lb 3.2 oz (138.438 kg)   SpO2: 97%  94% 96%   No intake or output data in the 24 hours ending 05/23/12 0829 Filed Weights   05/22/12 1730 05/22/12 2102  Weight: 305 lb (138.347 kg) 305 lb 3.2 oz (138.438 kg)    PHYSICAL EXAM General: Well developed, well nourished, male in no acute distress. Head: Normocephalic, atraumatic.  Neck: Supple without bruits, JVD. Lungs:  Resp regular and unlabored, CTA. Heart: RRR, S1, S2, no S3, S4, or murmur; no rub. Abdomen: Soft, non-tender, non-distended, BS + x 4.  Extremities: No clubbing, cyanosis, edema.  Neuro: Alert and oriented X 3. Moves all extremities spontaneously. Psych: Normal affect.  LABS: CBC: Recent Labs  05/22/12 1744 05/23/12 0610  WBC 9.2 6.0  NEUTROABS 6.9  --   HGB 15.8 15.0  HCT 44.8 43.5  MCV 90.3 91.2  PLT 181 172   INR: Recent Labs  05/23/12 0610  INR 0.96   Basic Metabolic Panel: Recent Labs  05/22/12 1744 05/23/12 0610  NA 137 138  K 3.8 4.1  CL 99 100  CO2 25 27  GLUCOSE 99 108*  BUN 22 16  CREATININE 0.90 0.97  CALCIUM 9.4 8.8   Liver Function Tests: Recent Labs  05/22/12 1744  AST 40*  ALT 30  ALKPHOS 73  BILITOT 0.4  PROT 7.2  ALBUMIN 4.1   Cardiac Enzymes: Recent Labs  05/22/12 1744 05/22/12 2309 05/23/12 0610  TROPONINI <0.30 <0.30 <0.30  Fasting Lipid Panel: Recent Labs  05/23/12 0610  CHOL 187  HDL 68  LDLCALC 104*  TRIG 77  CHOLHDL 2.8    TELE:      NSR no arrhythmia or VT  ECG:  NSR normal ECG  Radiology/Studies: Dg Chest 2  View  05/22/2012   *RADIOLOGY REPORT*  Clinical Data: Chest pain  CHEST - 2 VIEW  Comparison: 03/21/2012  Findings: Heart size is upper normal.  Negative for heart failure. Lungs are free of infiltrate effusion or mass.  IMPRESSION: No active cardiopulmonary abnormality.   Original Report Authenticated By: Janeece Riggers, M.D.     Current Medications:  . aspirin EC  81 mg Oral Daily  . atorvastatin  80 mg Oral q1800  . lisinopril  20 mg Oral Daily   And  . hydrochlorothiazide  25 mg Oral Daily   . heparin 1,600 Units/hr (05/22/12 2354)    ASSESSMENT AND PLAN: Chest pain: R/O normal ECG can walk on treadmill.  Intermediate risk history  Inpatient stress myovue.  Continue asa and heparin for now Chol:  Continue statin  05/23/2012

## 2012-05-24 ENCOUNTER — Encounter (HOSPITAL_COMMUNITY): Payer: 59 | Attending: Cardiovascular Disease

## 2012-05-24 ENCOUNTER — Encounter: Payer: Self-pay | Admitting: Sports Medicine

## 2012-05-24 ENCOUNTER — Encounter (HOSPITAL_COMMUNITY): Payer: 59

## 2012-05-24 DIAGNOSIS — R0789 Other chest pain: Secondary | ICD-10-CM | POA: Insufficient documentation

## 2012-05-24 DIAGNOSIS — R072 Precordial pain: Principal | ICD-10-CM | POA: Diagnosis present

## 2012-05-24 LAB — CBC
HCT: 45.7 % (ref 39.0–52.0)
Hemoglobin: 15.4 g/dL (ref 13.0–17.0)
MCH: 31.2 pg (ref 26.0–34.0)
MCHC: 33.7 g/dL (ref 30.0–36.0)
MCV: 92.7 fL (ref 78.0–100.0)
Platelets: 175 K/uL (ref 150–400)
RBC: 4.93 MIL/uL (ref 4.22–5.81)
RDW: 13.2 % (ref 11.5–15.5)
WBC: 6.4 K/uL (ref 4.0–10.5)

## 2012-05-24 LAB — HEPARIN LEVEL (UNFRACTIONATED): Heparin Unfractionated: 0.25 IU/mL — ABNORMAL LOW (ref 0.30–0.70)

## 2012-05-24 MED ORDER — ASPIRIN 81 MG PO TABS: 81.0000 mg | ORAL_TABLET | Freq: Every day | ORAL | Status: DC

## 2012-05-24 MED ORDER — TECHNETIUM TC 99M SESTAMIBI GENERIC - CARDIOLITE
30.0000 | Freq: Once | INTRAVENOUS | Status: AC | PRN
  Administered 2012-05-24: 30 via INTRAVENOUS

## 2012-05-24 NOTE — Progress Notes (Signed)
Patient and patient's wife given discharge instructions.  Denies any questions or concerns.  IV access removed and cannula intact.  No bleeding, redness, drainage to site.  Pt tolerated removal well.  Pt given discharge paperwork, prescriptions, and pt with all personal belongings.  Pt discharged per wheelchair to private vehicle to home.

## 2012-05-24 NOTE — Progress Notes (Addendum)
Patient ID: Travis Huff, male   DOB: [birth year], 52 y.o.   MRN: [id number]   Patient Name: Travis Huff Date of Encounter: 05/24/2012  Active Problems:   * No active hospital problems. *    SUBJECTIVE: No chest pain  OBJECTIVE Filed Vitals:   05/23/12 1427 05/23/12 1431 05/23/12 2127 05/24/12 0438  BP: 101/56 123/51 102/59 92/56  Pulse: 80 71 78 61  Temp: 98.5 F (36.9 C) 98.3 F (36.8 C) 98.6 F (37 C) 97.9 F (36.6 C)  TempSrc:   Oral Oral  Resp: 18 20 20 18   Height:      Weight:      SpO2: 96% 100% 98% 96%   No intake or output data in the 24 hours ending 05/24/12 0812 Filed Weights   05/22/12 1730 05/22/12 2102  Weight: 305 lb (138.347 kg) 305 lb 3.2 oz (138.438 kg)    PHYSICAL EXAM General: Well developed, well nourished, male in no acute distress. Head: Normocephalic, atraumatic.  Neck: Supple without bruits, JVD. Lungs:  Resp regular and unlabored, CTA. Heart: RRR, S1, S2, no S3, S4, or murmur; no rub. Abdomen: Soft, non-tender, non-distended, BS + x 4.  Extremities: No clubbing, cyanosis, edema.  Neuro: Alert and oriented X 3. Moves all extremities spontaneously. Psych: Normal affect.  LABS: CBC:  Recent Labs  05/22/12 1744 05/23/12 0610 05/24/12 0540  WBC 9.2 6.0 6.4  NEUTROABS 6.9  --   --   HGB 15.8 15.0 15.4  HCT 44.8 43.5 45.7  MCV 90.3 91.2 92.7  PLT 181 172 175   INR:  Recent Labs  05/23/12 0610  INR 0.96   Basic Metabolic Panel:  Recent Labs  40/98/11 1744 05/23/12 0610  NA 137 138  K 3.8 4.1  CL 99 100  CO2 25 27  GLUCOSE 99 108*  BUN 22 16  CREATININE 0.90 0.97  CALCIUM 9.4 8.8   Liver Function Tests:  Recent Labs  05/22/12 1744  AST 40*  ALT 30  ALKPHOS 73  BILITOT 0.4  PROT 7.2  ALBUMIN 4.1   Cardiac Enzymes:  Recent Labs  05/22/12 2309 05/23/12 0610 05/23/12 1504  TROPONINI <0.30 <0.30 <0.30  Fasting Lipid Panel:  Recent Labs  05/23/12 0610  CHOL 187  HDL 68  LDLCALC 104*  TRIG 77    CHOLHDL 2.8    TELE:      NSR no arrhythmia or VT  ECG:  NSR normal ECG  Radiology/Studies: Dg Chest 2 View  05/22/2012   *RADIOLOGY REPORT*  Clinical Data: Chest pain  CHEST - 2 VIEW  Comparison: 03/21/2012  Findings: Heart size is upper normal.  Negative for heart failure. Lungs are free of infiltrate effusion or mass.  IMPRESSION: No active cardiopulmonary abnormality.   Original Report Authenticated By: Janeece Riggers, M.D.     Current Medications:  . aspirin EC  81 mg Oral Daily  . atorvastatin  80 mg Oral q1800  . lisinopril  20 mg Oral Daily   And  . hydrochlorothiazide  25 mg Oral Daily   . heparin 1,800 Units/hr (05/24/12 0640)    ASSESSMENT AND PLAN: Chest pain: R/O normal ECG can walk on treadmill.  Echo reviewed and normal EF 65%. Continue asa  Myovue results pending   Chol:  Continue statin  F/U TB  05/24/2012  Tech called and said she thought there was an abnormality on fully extracted images  I thought RAW stress images looked normal Dr Jens Som to review and likely  patient will have rest images. Discussed with wife who is CCU nurse.  Charlton Haws

## 2012-05-24 NOTE — Progress Notes (Signed)
ANTICOAGULATION CONSULT NOTE - Initial Consult  Pharmacy Consult for Heparin Indication: chest pain/ACS  Allergies  Allergen Reactions  . Other     Pain meds- unknown name to pt    Patient Measurements: Height: 5\' 11"  (180.3 cm) Weight: 305 lb 3.2 oz (138.438 kg) IBW/kg (Calculated) : 75.3 Heparin Dosing Weight: 100 kg  Vital Signs: Temp: 97.9 F (36.6 C) (05/23 0438) Temp src: Oral (05/23 0438) BP: 92/56 mmHg (05/23 0438) Pulse Rate: 61 (05/23 0438)  Labs:  Recent Labs  05/22/12 1744 05/22/12 2309 05/23/12 0610 05/23/12 1504 05/24/12 0540  HGB 15.8  --  15.0  --  15.4  HCT 44.8  --  43.5  --  45.7  PLT 181  --  172  --  175  LABPROT  --   --  12.7  --   --   INR  --   --  0.96  --   --   HEPARINUNFRC  --   --  0.54  --  0.25*  CREATININE 0.90  --  0.97  --   --   TROPONINI <0.30 <0.30 <0.30 <0.30  --     Estimated Creatinine Clearance: 128.1 ml/min (by C-G formula based on Cr of 0.97).   Medical History: Past Medical History  Diagnosis Date  . Hypertension   . Asthma   . Viral pericarditis     Medications:  Prescriptions prior to admission  Medication Sig Dispense Refill  . lisinopril-hydrochlorothiazide (PRINZIDE,ZESTORETIC) 20-25 MG per tablet Take 1 tablet by mouth daily.  90 tablet  3  . sildenafil (VIAGRA) 100 MG tablet Take 1 tablet (100 mg total) by mouth as needed for erectile dysfunction (for use prior to sexual activity).  30 tablet  0  . [DISCONTINUED] albuterol (PROVENTIL HFA;VENTOLIN HFA) 108 (90 BASE) MCG/ACT inhaler Inhale 2 puffs into the lungs every 4 (four) hours as needed for wheezing.  1 Inhaler  0  . [DISCONTINUED] docusate sodium (COLACE) 100 MG capsule Take 1 capsule (100 mg total) by mouth 3 (three) times daily as needed for constipation.  90 capsule  3    Assessment: 52 yo male with chest pain for heparin.  Heparin level now subtherapeutic 0.25 units/ml.  No issues noted with infusion.    Goal of Therapy:  Heparin level  0.3-0.7 units/ml Monitor platelets by anticoagulation protocol: Yes   Plan:  Increase heparin drip to 1800 units/hr Check heparin level in 6 hours.   Kaliopi Blyden Poteet 05/24/2012,6:37 AM

## 2012-05-24 NOTE — Discharge Summary (Signed)
CARDIOLOGY DISCHARGE SUMMARY   Patient ID: Travis Huff MRN: [id number] DOB/AGE: 1960/04/05 52 y.o.  Admit date: 05/22/2012 Discharge date: 05/24/2012  Primary Discharge Diagnosis:   Precordial pain Secondary Discharge Diagnosis:    Hypertension  Procedures:  Exercise treadmill Cardiolite, 2-D echocardiogram  Hospital Course: Travis Huff is a 52 y.o. male with no history of CAD. He has multiple cardiac risk factors. He had chest pressure and came to the emergency room where he was admitted for further evaluation and treatment.  His cardiac enzymes were negative for MI. He had a lipid profile performed which showed a slightly elevated LDL but his HDL was 68. A chest x-ray had no significant abnormality. A 2-D echocardiogram was performed with full results below. He had grade 2 diastolic dysfunction and good blood pressure control will be encouraged. A stress Cardiolite was performed. The full results are below.  Labs:   Lab Results  Component Value Date   WBC 6.4 05/24/2012   HGB 15.4 05/24/2012   HCT 45.7 05/24/2012   MCV 92.7 05/24/2012   PLT 175 05/24/2012     Recent Labs Lab 05/22/12 1744 05/23/12 0610  NA 137 138  K 3.8 4.1  CL 99 100  CO2 25 27  BUN 22 16  CREATININE 0.90 0.97  CALCIUM 9.4 8.8  PROT 7.2  --   BILITOT 0.4  --   ALKPHOS 73  --   ALT 30  --   AST 40*  --   GLUCOSE 99 108*    Recent Labs  05/22/12 2309 05/23/12 0610 05/23/12 1504  TROPONINI <0.30 <0.30 <0.30   Lipid Panel     Component Value Date/Time   CHOL 187 05/23/2012 0610   TRIG 77 05/23/2012 0610   HDL 68 05/23/2012 0610   CHOLHDL 2.8 05/23/2012 0610   VLDL 15 05/23/2012 0610   LDLCALC 104* 05/23/2012 0610    Recent Labs  05/23/12 0610  INR 0.96      Radiology: Dg Chest 2 View 05/22/2012   *RADIOLOGY REPORT*  Clinical Data: Chest pain  CHEST - 2 VIEW  Comparison: 03/21/2012  Findings: Heart size is upper normal.  Negative for heart failure. Lungs are free of infiltrate  effusion or mass.  IMPRESSION: No active cardiopulmonary abnormality.   Original Report Authenticated By: Janeece Riggers, M.D.   Nm Myocar Multi W/spect W/wall Motion / Ef 05/24/2012   *RADIOLOGY REPORT*  Clinical Data:  Chest pain  MYOCARDIAL IMAGING WITH SPECT (REST AND EXERCISE - 2 DAY PROTOCOL) GATED LEFT VENTRICULAR WALL MOTION STUDY LEFT VENTRICULAR EJECTION FRACTION  Technique:  Standard myocardial SPECT imaging was performed after resting intravenous injection of 30 mCi Tc-48m sestamibi.  On a different day, exercise tolerance test was performed by the patient under the supervision of the Cardiology staff.  At peak-stress, 30 mCi Tc-14m sestamibi was injected intravenously and standard myocardial SPECT imaging was performed.  Quantitative gated imaging was also performed to evaluate left ventricular wall motion, and estimate left ventricular ejection fraction.  Comparison:  None.  Findings: The stress SPECT images demonstrate physiologic distribution of radiopharmaceutical. Rest images demonstrate no perfusion defects.  The gated stress SPECT images demonstrate normal left ventricular myocardial thickening.  No focal wall motion abnormality is seen.  Calculated left ventricular end-diastolic volume 99 ml, end- systolic volume 37 ml, ejection fraction of 63%.  IMPRESSION: 1. Negative for excercise-stress induced ischemia.  2. Left ventricular ejection fraction 63%.   Original Report Authenticated By: Charline Bills, M.D.  EKG: 23-May-2012 04:53:06  Normal sinus rhythm Low voltage QRS Borderline ECG 56mm/s 84mm/mV 100Hz  8.0.1 12SL 239 CID: [id number] Referred by: Ranae Palms DAVID Unconfirmed Vent. rate 68 BPM PR interval 172 ms QRS duration 106 ms QT/QTc 410/435 ms P-R-T axes 46 43 43  Echo: 05/23/2012 Study Conclusions Left ventricle: The cavity size was normal. There was mild concentric hypertrophy. Systolic function was normal. The estimated ejection fraction was in the range of 60% to 65%. Wall  motion was normal; there were no regional wall motion abnormalities. Features are consistent with a pseudonormal left ventricular filling pattern, with concomitant abnormal relaxation and increased filling pressure (grade 2 diastolic dysfunction).   FOLLOW UP PLANS AND APPOINTMENTS Allergies  Allergen Reactions  . Other     Pain meds- unknown name to pt     Medication List    STOP taking these medications       albuterol 108 (90 BASE) MCG/ACT inhaler  Commonly known as:  PROVENTIL HFA;VENTOLIN HFA     docusate sodium 100 MG capsule  Commonly known as:  COLACE      TAKE these medications       aspirin 81 MG tablet  Take 1 tablet (81 mg total) by mouth daily.     lisinopril-hydrochlorothiazide 20-25 MG per tablet  Commonly known as:  PRINZIDE,ZESTORETIC  Take 1 tablet by mouth daily.     sildenafil 100 MG tablet  Commonly known as:  VIAGRA  Take 1 tablet (100 mg total) by mouth as needed for erectile dysfunction (for use prior to sexual activity).         Future Appointments Provider Department Dept Phone   08/01/2012 10:00 AM Wendall Stade, MD [facility] Main Office [facility]) [phone]       BRING ALL MEDICATIONS WITH YOU TO FOLLOW UP APPOINTMENTS  Time spent with patient to include physician time: 38 min Signed: Theodore Demark, PA-C 05/24/2012, 5:19 PM Co-Sign MD

## 2012-06-10 ENCOUNTER — Encounter: Payer: 59 | Admitting: Internal Medicine

## 2012-06-10 ENCOUNTER — Other Ambulatory Visit: Payer: Self-pay | Admitting: Gastroenterology

## 2012-06-12 ENCOUNTER — Telehealth: Payer: Self-pay | Admitting: *Deleted

## 2012-06-12 DIAGNOSIS — I10 Essential (primary) hypertension: Secondary | ICD-10-CM

## 2012-06-12 DIAGNOSIS — Z299 Encounter for prophylactic measures, unspecified: Secondary | ICD-10-CM

## 2012-06-12 DIAGNOSIS — N529 Male erectile dysfunction, unspecified: Secondary | ICD-10-CM

## 2012-06-13 LAB — PSA, TOTAL AND FREE
PSA, Free Pct: 21 % — ABNORMAL LOW (ref >25)
PSA, Free: 0.13 ng/mL
PSA: 0.62 ng/mL (ref <=4.00)

## 2012-06-13 LAB — TESTOSTERONE, FREE, TOTAL, SHBG
Sex Hormone Binding: 34 nmol/L (ref 13–71)
Testosterone, Free: 64.6 pg/mL (ref 47.0–244.0)
Testosterone-% Free: 2 % (ref 1.6–2.9)
Testosterone: 331 ng/dL (ref 300–890)

## 2012-07-22 ENCOUNTER — Telehealth: Payer: Self-pay | Admitting: *Deleted

## 2012-07-22 MED ORDER — TADALAFIL 5 MG PO TABS: 5.0000 mg | ORAL_TABLET | Freq: Every day | ORAL | Status: DC

## 2012-07-22 NOTE — Telephone Encounter (Signed)
Yes that's fine, prescription sent in.

## 2012-07-22 NOTE — Telephone Encounter (Signed)
Pt calls and states he called last week but never got an answer- I do not see anything in the chart for documentation.  He is currently taking Viagra but wants to know if you would switch him to Cialis for daily use since this helps with BPH as well. Barry Dienes, LPN

## 2012-07-23 NOTE — Telephone Encounter (Signed)
LMOM that new med sent to pharmacy. Barry Dienes, LPN

## 2012-08-01 ENCOUNTER — Encounter: Payer: Self-pay | Admitting: Cardiovascular Disease

## 2012-08-01 ENCOUNTER — Ambulatory Visit (INDEPENDENT_AMBULATORY_CARE_PROVIDER_SITE_OTHER): Payer: 59 | Admitting: Cardiovascular Disease

## 2012-08-01 VITALS — BP 140/88 | HR 82 | Wt 319.0 lb

## 2012-08-01 DIAGNOSIS — Z96651 Presence of right artificial knee joint: Secondary | ICD-10-CM

## 2012-08-01 DIAGNOSIS — R072 Precordial pain: Secondary | ICD-10-CM

## 2012-08-01 DIAGNOSIS — Z96659 Presence of unspecified artificial knee joint: Secondary | ICD-10-CM

## 2012-08-01 DIAGNOSIS — I10 Essential (primary) hypertension: Secondary | ICD-10-CM

## 2012-08-01 NOTE — Patient Instructions (Signed)
Your physician recommends that you schedule a follow-up appointment in: AS NEEDED  Your physician recommends that you continue on your current medications as directed. Please refer to the Current Medication list given to you today.  

## 2012-08-01 NOTE — Assessment & Plan Note (Signed)
Non recurrent Testing reassuring Discussed calcium score with him to further risk stratify for presence of CAD

## 2012-08-01 NOTE — Assessment & Plan Note (Signed)
Discussed low carb diet and exercise program  

## 2012-08-01 NOTE — Assessment & Plan Note (Signed)
Well controlled.  Continue current medications and low sodium Dash type diet.    

## 2012-08-01 NOTE — Progress Notes (Signed)
Patient ID: Travis Huff, male   DOB: 08-Aug-1960, 52 y.o.   MRN: [id number]  52 y.o. male with no history of CAD. He has multiple cardiac risk factors. He had chest pressure and came to the emergency room where he was admitted for further evaluation and treatment in May     His cardiac enzymes were negative for MI. He had a lipid profile performed which showed a slightly elevated LDL but his HDL was 68. A chest x-ray had no significant abnormality. A 2-D echocardiogram was performed with full results below. He had grade 2 diastolic dysfunction and good blood pressure control will be encouraged. A stress Cardiolite was performed. The full results are below.  Pain thought to be non cardiac given r/o and normal myovue and echo   05/22/12 2309  05/23/12 0610  05/23/12 1504   TROPONINI  <0.30  <0.30  <0.30   Lipid Panel    Component  Value  Date/Time    CHOL  187  05/23/2012 0610    TRIG  77  05/23/2012 0610    HDL  68  05/23/2012 0610    CHOLHDL  2.8  05/23/2012 0610    VLDL  15  05/23/2012 0610    LDLCALC  104*  05/23/2012 0610    Radiology: Dg Chest 2 View  05/22/2012 *RADIOLOGY REPORT* Clinical Data: Chest pain CHEST - 2 VIEW Comparison: 03/21/2012 Findings: Heart size is upper normal. Negative for heart failure. Lungs are free of infiltrate effusion or mass. IMPRESSION: No active cardiopulmonary abnormality. Original Report Authenticated By: Janeece Riggers, M.D.  Nm Myocar Multi W/spect W/wall Motion / Ef  05/24/2012 *RADIOLOGY REPORT* Clinical Data: Chest pain MYOCARDIAL IMAGING WITH SPECT (REST AND EXERCISE - 2 DAY PROTOCOL) GATED LEFT VENTRICULAR WALL MOTION STUDY LEFT VENTRICULAR EJECTION FRACTION Technique: Standard myocardial SPECT imaging was performed after resting intravenous injection of 30 mCi Tc-26m sestamibi. On a different day, exercise tolerance test was performed by the patient under the supervision of the Cardiology staff. At peak-stress, 30 mCi Tc-70m sestamibi was injected  intravenously and standard myocardial SPECT imaging was performed. Quantitative gated imaging was also performed to evaluate left ventricular wall motion, and estimate left ventricular ejection fraction. Comparison: None. Findings: The stress SPECT images demonstrate physiologic distribution of radiopharmaceutical. Rest images demonstrate no perfusion defects. The gated stress SPECT images demonstrate normal left ventricular myocardial thickening. No focal wall motion abnormality is seen. Calculated left ventricular end-diastolic volume 99 ml, end- systolic volume 37 ml, ejection fraction of 63%. IMPRESSION: 1. Negative for excercise-stress induced ischemia. 2. Left ventricular ejection fraction 63%. Original Report Authenticated By: Charline Bills, M.D.    Echo: 05/23/2012  Study Conclusions Left ventricle: The cavity size was normal. There was mild concentric hypertrophy. Systolic function was normal. The estimated ejection fraction was in the range of 60% to 65%. Wall motion was normal; there were no regional wall motion abnormalities. Features are consistent with a pseudonormal left ventricular filling pattern, with concomitant abnormal relaxation and increased filling pressure (grade 2 diastolic dysfunction).   ROS: Denies fever, malais, weight loss, blurry vision, decreased visual acuity, cough, sputum, SOB, hemoptysis, pleuritic pain, palpitaitons, heartburn, abdominal pain, melena, lower extremity edema, claudication, or rash.  All other systems reviewed and negative   General: Affect appropriate Healthy:  appears stated age HEENT: normal Neck supple with no adenopathy JVP normal no bruits no thyromegaly Lungs clear with no wheezing and good diaphragmatic motion Heart:  S1/S2 no murmur,rub, gallop or click PMI normal Abdomen:  benighn, BS positve, no tenderness, no AAA no bruit.  No HSM or HJR Distal pulses intact with no bruits No edema Neuro non-focal Skin warm and dry No  muscular weakness  Medications Current Outpatient Prescriptions  Medication Sig Dispense Refill  . aspirin 81 MG tablet Take 1 tablet (81 mg total) by mouth daily.  30 tablet    . lisinopril-hydrochlorothiazide (PRINZIDE,ZESTORETIC) 20-25 MG per tablet Take 1 tablet by mouth daily.  90 tablet  3  . tadalafil (CIALIS) 5 MG tablet Take 1 tablet (5 mg total) by mouth daily. Use daily.  30 tablet  3   No current facility-administered medications for this visit.    Allergies Other  Family History: Family History  Problem Relation Age of Onset  . Adopted: Yes    Social History: History   Social History  . Marital Status: Married    Spouse Name: N/A    Number of Children: N/A  . Years of Education: N/A   Occupational History  . Not on file.   Social History Main Topics  . Smoking status: Former Games developer  . Smokeless tobacco: Not on file  . Alcohol Use: Yes     Comment: 2 beers QD  . Drug Use: No  . Sexually Active: Not on file   Other Topics Concern  . Not on file   Social History Narrative  . No narrative on file    Electrocardiogram: EKG: 23-May-2012 04:53:06  Normal sinus rhythm  Low voltage QRS  Borderline ECG  27mm/s 35mm/mV 100Hz  8.0.1 12SL 239 CID: 21  Referred by: Ranae Palms DAVID Unconfirmed  Vent. rate 68 BPM  PR interval 172 ms  QRS duration 106 ms  QT/QTc 410/435 ms  P-R-T axes 46 43 43   Assessment and Plan

## 2012-08-01 NOTE — Assessment & Plan Note (Signed)
PRN NSAI"s discussed problem with his weight and need for repeat surgery if he cannot lose it

## 2012-10-23 ENCOUNTER — Ambulatory Visit: Payer: 59

## 2012-10-23 ENCOUNTER — Ambulatory Visit (INDEPENDENT_AMBULATORY_CARE_PROVIDER_SITE_OTHER): Payer: 59 | Admitting: Physician Assistant

## 2012-10-23 DIAGNOSIS — Z23 Encounter for immunization: Secondary | ICD-10-CM

## 2012-10-23 NOTE — Progress Notes (Signed)
  Subjective:    Patient ID: Travis Huff, male    DOB: 08/22/1960, 52 y.o.   MRN: [id number]  HPI    Review of Systems     Objective:   Physical Exam        Assessment & Plan:  Flu shot given without complications. Tandy Gaw PA-C

## 2013-03-10 ENCOUNTER — Other Ambulatory Visit: Payer: Self-pay | Admitting: Sports Medicine

## 2013-04-10 ENCOUNTER — Other Ambulatory Visit: Payer: Self-pay | Admitting: Sports Medicine

## 2013-05-27 ENCOUNTER — Other Ambulatory Visit: Payer: Self-pay | Admitting: Sports Medicine

## 2013-06-18 ENCOUNTER — Telehealth: Payer: Self-pay | Admitting: Sports Medicine

## 2013-06-18 MED ORDER — CYCLOBENZAPRINE HCL 10 MG PO TABS
ORAL_TABLET | ORAL | Status: DC
Start: 2013-06-18 — End: 2013-06-19

## 2013-06-18 NOTE — Telephone Encounter (Signed)
Spoke to patient he stated that he will go by Travis Huff and pick up his reports and call and scheduke an appt. Rhonda Cunningham,CMA

## 2013-06-18 NOTE — Telephone Encounter (Signed)
Flexeril sent in, if he would like me to take a look at his back I'd be happy to. I would need all the reports of the injections but these have, and the exact locations the injections have been placed.

## 2013-06-18 NOTE — Telephone Encounter (Signed)
Dr. T please see note below. Chandra Feger,CMA  

## 2013-06-18 NOTE — Telephone Encounter (Signed)
Patient called and advised that he has been going to [facility] and they have arecent x-ray in the last month that shows his spine and hip and the injections are not working for him. [place] and request to know if you could call him in some meds (Flexorill) for pain or would he have to come in for an appt. Patient request to a nurse to call. Thanks [phone] work number will be the best.

## 2013-06-19 ENCOUNTER — Ambulatory Visit (INDEPENDENT_AMBULATORY_CARE_PROVIDER_SITE_OTHER): Payer: 59 | Admitting: Sports Medicine

## 2013-06-19 ENCOUNTER — Encounter: Payer: Self-pay | Admitting: Sports Medicine

## 2013-06-19 VITALS — BP 133/86 | HR 84 | Ht 71.0 in | Wt 308.0 lb

## 2013-06-19 DIAGNOSIS — M4726 Other spondylosis with radiculopathy, lumbar region: Secondary | ICD-10-CM

## 2013-06-19 DIAGNOSIS — M47816 Spondylosis without myelopathy or radiculopathy, lumbar region: Secondary | ICD-10-CM | POA: Insufficient documentation

## 2013-06-19 DIAGNOSIS — M47817 Spondylosis without myelopathy or radiculopathy, lumbosacral region: Secondary | ICD-10-CM

## 2013-06-19 NOTE — Assessment & Plan Note (Signed)
Symptoms are predominantly discogenic and relieved with forward flexion. He does have severe bilateral facet arthritis. It's worst of the left L5-S1 level. We are going to target the lower 3 facet joints bilaterally. Return to see me after facet injections. He does have some bilateral radicular symptoms as well that has been treated by Dr. Herma Mering with epidurals.

## 2013-06-19 NOTE — Progress Notes (Signed)
  Subjective:    CC: Low back pain  HPI: This is a pleasant 53 year old male, he has been getting lumbar epidurals by Dr. Nelva Bush, these have been effective. For his radicular symptoms, unfortunately he continues to have back pain that is predominantly axial, described as stabbing, localized to the left side of the low back. When he touches his toes it relieves his back pain. Moderate, persistent.  Past medical history, Surgical history, Family history not pertinant except as noted below, Social history, Allergies, and medications have been entered into the medical record, reviewed, and no changes needed.   Review of Systems: No fevers, chills, night sweats, weight loss, chest pain, or shortness of breath.   Objective:    General: Well Developed, well nourished, and in no acute distress.  Neuro: Alert and oriented x3, extra-ocular muscles intact, sensation grossly intact.  HEENT: Normocephalic, atraumatic, pupils equal round reactive to light, neck supple, no masses, no lymphadenopathy, thyroid nonpalpable.  Skin: Warm and dry, no rashes. Cardiac: Regular rate and rhythm, no murmurs rubs or gallops, no lower extremity edema.  Respiratory: Clear to auscultation bilaterally. Not using accessory muscles, speaking in full sentences. Back Exam:  Inspection: Unremarkable  Motion: Flexion 45 deg, Extension 45 deg, Side Bending to 45 deg bilaterally,  Rotation to 45 deg bilaterally  SLR laying: Negative  XSLR laying: Negative  Palpable tenderness: None. FABER: negative. Sensory change: Gross sensation intact to all lumbar and sacral dermatomes.  Reflexes: 2+ at both patellar tendons, 2+ at achilles tendons, Babinski's downgoing.  Strength at foot  Plantar-flexion: 5/5 Dorsi-flexion: 5/5 Eversion: 5/5 Inversion: 5/5  Leg strength  Quad: 5/5 Hamstring: 5/5 Hip flexor: 5/5 Hip abductors: 5/5  Gait unremarkable.  Lumbar spine MRI shows multilevel disc fusions, there is also very severe facet  arthrosis worst at the left-sided L5-S1 level.  Impression and Recommendations:

## 2013-06-23 ENCOUNTER — Other Ambulatory Visit: Payer: Self-pay | Admitting: Sports Medicine

## 2013-06-23 ENCOUNTER — Ambulatory Visit
Admission: RE | Admit: 2013-06-23 | Discharge: 2013-06-23 | Disposition: A | Discharge status: Home / Self Care | Payer: 59 | Source: Ambulatory Visit | Attending: Sports Medicine | Admitting: Sports Medicine

## 2013-06-23 DIAGNOSIS — M4726 Other spondylosis with radiculopathy, lumbar region: Secondary | ICD-10-CM

## 2013-06-23 MED ORDER — IOHEXOL 180 MG/ML  SOLN
1.0000 mL | Freq: Once | INTRAMUSCULAR | Status: AC | PRN
Start: 2013-06-23 — End: 2013-06-23
  Administered 2013-06-23: 1 mL via INTRA_ARTICULAR

## 2013-06-23 MED ORDER — METHYLPREDNISOLONE ACETATE 40 MG/ML INJ SUSP (RADIOLOG
120.0000 mg | Freq: Once | INTRAMUSCULAR | Status: AC
  Administered 2013-06-23: 120 mg via INTRA_ARTICULAR

## 2013-06-23 NOTE — Discharge Instructions (Signed)

## 2013-06-30 ENCOUNTER — Telehealth: Payer: Self-pay | Admitting: *Deleted

## 2013-06-30 ENCOUNTER — Other Ambulatory Visit: Payer: Self-pay | Admitting: Sports Medicine

## 2013-06-30 DIAGNOSIS — M25551 Pain in right hip: Secondary | ICD-10-CM

## 2013-06-30 NOTE — Telephone Encounter (Signed)
Mr. Travis Huff called and stated that his right hip is really bothering him and hurting. He would like a referral to [facility] to see Dr. Wynelle Link.

## 2013-06-30 NOTE — Telephone Encounter (Signed)
Done. Since this is radicular he may want to see Dr. Rolena Infante at [facility] instead.

## 2013-07-03 ENCOUNTER — Telehealth: Payer: Self-pay | Admitting: *Deleted

## 2013-07-03 NOTE — Telephone Encounter (Signed)
Msg left to call office regarding his referral. Margette Fast, [place]

## 2013-07-03 NOTE — Telephone Encounter (Signed)
Called and left message on pt's vm that he schedule appt with Dr. Darene Lamer

## 2013-07-03 NOTE — Telephone Encounter (Signed)
It depends on whether the hip pain is coming from the joint, the bursa, or referred pain from his back. If the pain is coming from the bursa or the joint I can do that injection here in the office myself. If this is different than his back pain he should probably come me look at it and determine which structure the pain is coming from.

## 2013-07-03 NOTE — Telephone Encounter (Signed)
Pt states he just received injections at [facility] for his back pain. He wanted to know if he can get injections at [facility] for his hip pain. I see that he is being referred to another physician for this problem. Should we let the other physician go ahead and  eval and leave this to him?

## 2013-07-07 ENCOUNTER — Ambulatory Visit (INDEPENDENT_AMBULATORY_CARE_PROVIDER_SITE_OTHER): Payer: 59 | Admitting: Sports Medicine

## 2013-07-07 ENCOUNTER — Encounter: Payer: Self-pay | Admitting: Sports Medicine

## 2013-07-07 VITALS — BP 140/93 | HR 103 | Ht 72.0 in | Wt 307.0 lb

## 2013-07-07 DIAGNOSIS — M47817 Spondylosis without myelopathy or radiculopathy, lumbosacral region: Secondary | ICD-10-CM

## 2013-07-07 DIAGNOSIS — M25551 Pain in right hip: Secondary | ICD-10-CM

## 2013-07-07 DIAGNOSIS — M25559 Pain in unspecified hip: Secondary | ICD-10-CM

## 2013-07-07 DIAGNOSIS — M4726 Other spondylosis with radiculopathy, lumbar region: Secondary | ICD-10-CM

## 2013-07-07 DIAGNOSIS — M1612 Unilateral primary osteoarthritis, left hip: Secondary | ICD-10-CM | POA: Insufficient documentation

## 2013-07-07 NOTE — Assessment & Plan Note (Signed)
Pain is referrable to the right femoral acetabular joint. Ultrasound guided injection into the femoral acetabular joint as above. Return in one month. X-rays.

## 2013-07-07 NOTE — Assessment & Plan Note (Signed)
Good response to radicular pain with epidurals by Dr. Nelva Bush. All axial back pain has now resolved with bilateral L3-4, L4-5, and L5-S1 facet injections. When this returns, he would be a candidate for radiofrequency ablation.

## 2013-07-07 NOTE — Progress Notes (Signed)
  Subjective:    CC: Followup  HPI: Low back pain: Excellent response to radicular symptoms by epidurals, he was continuing to have some axial pain which is now completely resolved after injections of bilateral L3-L4, L4-L5, and L5-S1 facet joints. Very happy with results so far.  Right hip pain: Localized in the groin, moderate, persistent, worse with weightbearing, stiff in the morning.  Past medical history, Surgical history, Family history not pertinant except as noted below, Social history, Allergies, and medications have been entered into the medical record, reviewed, and no changes needed.   Review of Systems: No fevers, chills, night sweats, weight loss, chest pain, or shortness of breath.   Objective:    General: Well Developed, well nourished, and in no acute distress.  Neuro: Alert and oriented x3, extra-ocular muscles intact, sensation grossly intact.  HEENT: Normocephalic, atraumatic, pupils equal round reactive to light, neck supple, no masses, no lymphadenopathy, thyroid nonpalpable.  Skin: Warm and dry, no rashes. Cardiac: Regular rate and rhythm, no murmurs rubs or gallops, no lower extremity edema.  Respiratory: Clear to auscultation bilaterally. Not using accessory muscles, speaking in full sentences. Right Hip: ROM IR: 60 Deg, ER: 60 Deg, Flexion: 120 Deg, Extension: 100 Deg, Abduction: 45 Deg, Adduction: 45 Deg, internal rotation reproduces pain in the groin and buttock. Strength IR: 5/5, ER: 5/5, Flexion: 5/5, Extension: 5/5, Abduction: 5/5, Adduction: 5/5 Pelvic alignment unremarkable to inspection and palpation. Standing hip rotation and gait without trendelenburg / unsteadiness. Greater trochanter without tenderness to palpation. No tenderness over piriformis. No SI joint tenderness and normal minimal SI movement.  Procedure: Real-time Ultrasound Guided Injection of right femoral acetabular joint Device: GE Logiq E  Verbal informed consent obtained.    Time-out conducted.  Noted no overlying erythema, induration, or other signs of local infection.  Skin prepped in a sterile fashion.  Local anesthesia: Topical Ethyl chloride.  With sterile technique and under real time ultrasound guidance: Spinal needle advanced to the femoral head/neck junction, 2 cc kenalog 40, 4 cc lidocaine injected easily.  Completed without difficulty  Pain immediately resolved suggesting accurate placement of the medication.  Advised to call if fevers/chills, erythema, induration, drainage, or persistent bleeding.  Images permanently stored and available for review in the ultrasound unit.  Impression: Technically successful ultrasound guided injection.  Impression and Recommendations:

## 2013-07-29 ENCOUNTER — Other Ambulatory Visit: Payer: Self-pay | Admitting: Sports Medicine

## 2013-08-07 ENCOUNTER — Ambulatory Visit: Payer: 59 | Admitting: Sports Medicine

## 2013-08-14 ENCOUNTER — Other Ambulatory Visit (HOSPITAL_BASED_OUTPATIENT_CLINIC_OR_DEPARTMENT_OTHER): Payer: Self-pay | Admitting: Orthopedic Surgery

## 2013-08-14 DIAGNOSIS — M545 Low back pain, unspecified: Secondary | ICD-10-CM

## 2013-08-14 DIAGNOSIS — M5137 Other intervertebral disc degeneration, lumbosacral region: Secondary | ICD-10-CM

## 2013-08-16 ENCOUNTER — Ambulatory Visit (HOSPITAL_BASED_OUTPATIENT_CLINIC_OR_DEPARTMENT_OTHER)
Admission: RE | Admit: 2013-08-16 | Discharge: 2013-08-16 | Disposition: A | Discharge status: Home / Self Care | Payer: 59 | Source: Ambulatory Visit | Attending: Sports Medicine | Admitting: Sports Medicine

## 2013-08-16 ENCOUNTER — Ambulatory Visit (HOSPITAL_BASED_OUTPATIENT_CLINIC_OR_DEPARTMENT_OTHER)
Admission: RE | Admit: 2013-08-16 | Discharge: 2013-08-16 | Disposition: A | Discharge status: Home / Self Care | Payer: 59 | Source: Ambulatory Visit | Attending: Orthopedic Surgery | Admitting: Orthopedic Surgery

## 2013-08-16 DIAGNOSIS — G8929 Other chronic pain: Secondary | ICD-10-CM | POA: Diagnosis present

## 2013-08-16 DIAGNOSIS — M48061 Spinal stenosis, lumbar region without neurogenic claudication: Secondary | ICD-10-CM | POA: Diagnosis not present

## 2013-08-16 DIAGNOSIS — M545 Low back pain, unspecified: Secondary | ICD-10-CM | POA: Diagnosis not present

## 2013-08-16 DIAGNOSIS — M5137 Other intervertebral disc degeneration, lumbosacral region: Secondary | ICD-10-CM

## 2013-08-16 DIAGNOSIS — M5126 Other intervertebral disc displacement, lumbar region: Secondary | ICD-10-CM | POA: Diagnosis not present

## 2013-08-16 DIAGNOSIS — M25551 Pain in right hip: Secondary | ICD-10-CM

## 2013-08-20 ENCOUNTER — Telehealth: Payer: Self-pay

## 2013-08-20 NOTE — Telephone Encounter (Signed)
Patient called requested a pain medication for his back pain. Travis Huff,CMA

## 2013-08-21 NOTE — Telephone Encounter (Signed)
He had a good response to lumbar facet joint injections for his axial pain and epidurals for his radicular pain, if continues to have pain he should come back, he is a candidate for radiofrequency ablation for his axial pain.

## 2013-08-22 ENCOUNTER — Telehealth: Payer: Self-pay

## 2013-08-22 MED ORDER — TRAMADOL HCL 50 MG PO TABS: ORAL_TABLET | ORAL | Status: DC

## 2013-08-22 NOTE — Telephone Encounter (Signed)
Left detailed message on patient mvm advising him to schedule appt to come in and discuss RFA for Axial pain. Ruthmary Occhipinti,CMA

## 2013-08-22 NOTE — Telephone Encounter (Signed)
Left message medication has been faxed.

## 2013-08-22 NOTE — Telephone Encounter (Signed)
Patient called stated that he has appt next Tuesday to follow up about his back, he would like something called in for pain until then. Sylvie Mifsud,CMA

## 2013-08-22 NOTE — Telephone Encounter (Signed)
Small amount of tramadol given.

## 2013-08-26 ENCOUNTER — Ambulatory Visit (INDEPENDENT_AMBULATORY_CARE_PROVIDER_SITE_OTHER): Payer: 59 | Admitting: Sports Medicine

## 2013-08-26 ENCOUNTER — Encounter: Payer: Self-pay | Admitting: Sports Medicine

## 2013-08-26 VITALS — BP 126/88 | HR 83 | Ht 72.0 in | Wt 312.0 lb

## 2013-08-26 DIAGNOSIS — M47817 Spondylosis without myelopathy or radiculopathy, lumbosacral region: Secondary | ICD-10-CM

## 2013-08-26 DIAGNOSIS — M4726 Other spondylosis with radiculopathy, lumbar region: Secondary | ICD-10-CM

## 2013-08-26 MED ORDER — TRAMADOL HCL 50 MG PO TABS: ORAL_TABLET | ORAL | Status: DC

## 2013-08-26 MED ORDER — DIAZEPAM 5 MG PO TABS: ORAL_TABLET | ORAL | Status: DC

## 2013-08-26 NOTE — Progress Notes (Signed)
Patient ID: Travis Huff, male   DOB: July 26, 1960, 53 y.o.   MRN: [id number]  Subjective:    CC: Follow-up on back and hip pain  HPI: Mr. Venard is a very pleasant 53 year old man with history of lumbar spine osteoarthritis with radiculopathy, spinal stenosis, and right hip osteoarthritis who presents for follow-up on back pain and right femoral acetabular joint injection done 7/6. Reports that his hip pain did subside following the 7/6 injection, but has returned somewhat over the past few weeks. Also reports that the radicular pain in his legs and back, previously treated very successfully with epidurals by Dr. Nelva Bush, has returned. This radicular pain radiates from the lower back down the posterior aspect of both legs, left worse than right. Numbness and tingling is worse with walking and improves with sitting. No loss of bowel or bladder control. Axial back pain likely attributable to facet narrowing previously resolved with bilateral L3-L4, L4-L5, and L5-S1 facet injections; however, this pain has also returned over the past month. Has seen an outside orthopedist who has suggested medial branch block with subsequent radiofrequency ablation with Dr. Nelva Bush. Currently taking Aleve daily and started Meloxicam on 8/21, which has provided a good deal of relief.   Past medical history, Surgical history, Family history not pertinant except as noted below, Social history, Allergies, and medications have been entered into the medical record, reviewed, and no changes needed.   Review of Systems: No fevers, chills, night sweats, weight loss, chest pain, or shortness of breath.   Objective:    General: Well Developed, well nourished, and in no acute distress.  Neuro: Alert and oriented x3, extra-ocular muscles intact, sensation grossly intact.  HEENT: Normocephalic, atraumatic, pupils equal round reactive to light, neck supple, no masses, no lymphadenopathy, thyroid nonpalpable.  Skin: Warm and dry, no  rashes. Cardiac: Regular rate and rhythm, no murmurs rubs or gallops, no lower extremity edema.  Respiratory: Clear to auscultation bilaterally. Not using accessory muscles, speaking in full sentences.  Back Exam:  Inspection: Unremarkable  Motion: Flexion 45 deg, Extension 45 deg, Side Bending to 45 deg bilaterally,  Rotation to 45 deg bilaterally  SLR laying: Negative  XSLR laying: Negative  Palpable tenderness: None. FABER: negative. Sensory change: Gross sensation intact to all lumbar and sacral dermatomes.  Reflexes: 2+ at both patellar tendons, 2+ at achilles tendons, Babinski's downgoing.  Strength at foot  Plantar-flexion: 5/5 Dorsi-flexion: 5/5 Eversion: 5/5 Inversion: 5/5  Leg strength  Quad: 5/5 Hamstring: 5/5 Hip flexor: 5/5 Hip abductors: 5/5  Gait unremarkable.  Impression and Recommendations:   Lumbar spondylosis with radicular pain: While previous bilateral L3-L4, L4-L5, and L5-S1 provided relief from back pain and radicular symptoms for one month, his pain has returned. This makes him a good candidate for medial branch block with subsequent radiofrequency ablation. He is in the process of deciding whether to have this procedure done at [facility] or [facility]. Advised that if he decided to proceed at [place], we will be able to make the referral for him. Meloxicam prescribed 8/21 has been successful; advised that he could continue Meloxicam in combination with regular Aleve for maximal relief. - Tramadol PRN for pain - Valium for pain control during medial nerve block/radiofrequency ablation

## 2013-08-26 NOTE — Assessment & Plan Note (Signed)
Excellent response with complete resolution of axial back pain for a month after bilateral L3-L4, L4-L5, and L5-S1 facet injections. He now needs to decide whether he wants to go to [place] or [place] for medial branch blocks and radiofrequency ablation. Tramadol and Valium prescribed. Meloxicam has been more effective than naproxen for pain relief.

## 2013-08-26 NOTE — Patient Instructions (Signed)
Excellent response with complete resolution of axial back pain for a month after bilateral L3-L4, L4-L5, and L5-S1 facet injections. He now needs to decide whether he wants to go to [place] or [place] for medial branch blocks and radiofrequency ablation. Tramadol and Valium prescribed. Meloxicam has been more effective than naproxen for pain relief.  If you decide to use [facility] imaging, call me and I can set this up.

## 2013-08-27 ENCOUNTER — Other Ambulatory Visit: Payer: Self-pay | Admitting: Sports Medicine

## 2013-09-01 ENCOUNTER — Telehealth: Payer: Self-pay

## 2013-09-01 DIAGNOSIS — M47816 Spondylosis without myelopathy or radiculopathy, lumbar region: Secondary | ICD-10-CM

## 2013-09-01 NOTE — Telephone Encounter (Signed)
Medial branch blocks must be done before radiofrequency ablation. I will go ahead and place he worked, please call Danielle to set this up.

## 2013-09-01 NOTE — Addendum Note (Signed)
Addended by: Silverio Decamp on: 09/01/2013 04:57 PM   Modules accepted: Orders

## 2013-09-01 NOTE — Telephone Encounter (Signed)
error 

## 2013-09-01 NOTE — Telephone Encounter (Signed)
Patient would like referral to go to [place] for bilateral low back injection. Patient also wants to know if he can get a RFA or do he have to get the nerve block first. . He stated that he was previous scheduled with [facility] but he wants to go to [place]. Rhonda Cunningham,CMA

## 2013-09-02 ENCOUNTER — Other Ambulatory Visit: Payer: Self-pay | Admitting: Sports Medicine

## 2013-09-02 DIAGNOSIS — M47816 Spondylosis without myelopathy or radiculopathy, lumbar region: Secondary | ICD-10-CM

## 2013-09-02 NOTE — Telephone Encounter (Signed)
Left detailed message on patient mvm advising the information below. Terry Abila,CMA

## 2013-09-03 ENCOUNTER — Other Ambulatory Visit: Payer: Self-pay | Admitting: Sports Medicine

## 2013-09-03 ENCOUNTER — Ambulatory Visit
Admission: RE | Admit: 2013-09-03 | Discharge: 2013-09-03 | Disposition: A | Discharge status: Home / Self Care | Payer: 59 | Source: Ambulatory Visit | Attending: Sports Medicine | Admitting: Sports Medicine

## 2013-09-03 DIAGNOSIS — M47816 Spondylosis without myelopathy or radiculopathy, lumbar region: Secondary | ICD-10-CM

## 2013-09-05 ENCOUNTER — Ambulatory Visit
Admission: RE | Admit: 2013-09-05 | Discharge: 2013-09-05 | Disposition: A | Discharge status: Home / Self Care | Payer: 59 | Source: Ambulatory Visit | Attending: Sports Medicine | Admitting: Sports Medicine

## 2013-09-05 ENCOUNTER — Telehealth: Payer: Self-pay | Admitting: Sports Medicine

## 2013-09-05 ENCOUNTER — Other Ambulatory Visit: Payer: Self-pay | Admitting: Sports Medicine

## 2013-09-05 VITALS — BP 130/84 | HR 79 | Temp 98.4°F | Resp 14

## 2013-09-05 DIAGNOSIS — M25551 Pain in right hip: Secondary | ICD-10-CM

## 2013-09-05 DIAGNOSIS — M47816 Spondylosis without myelopathy or radiculopathy, lumbar region: Secondary | ICD-10-CM

## 2013-09-05 DIAGNOSIS — M4726 Other spondylosis with radiculopathy, lumbar region: Secondary | ICD-10-CM

## 2013-09-05 MED ORDER — KETOROLAC TROMETHAMINE 30 MG/ML IJ SOLN
30.0000 mg | Freq: Once | INTRAMUSCULAR | Status: AC
  Administered 2013-09-05: 60 mg via INTRAVENOUS

## 2013-09-05 MED ORDER — IOHEXOL 180 MG/ML  SOLN
1.5000 mL | Freq: Once | INTRAMUSCULAR | Status: AC | PRN
  Administered 2013-09-05: 1.5 mL via INTRA_ARTICULAR

## 2013-09-05 MED ORDER — CEFAZOLIN SODIUM 10 G IJ SOLR
3.0000 g | Freq: Once | INTRAMUSCULAR | Status: AC
  Administered 2013-09-05: 3 g via INTRAVENOUS

## 2013-09-05 MED ORDER — SODIUM CHLORIDE 0.9 % IV SOLN
Freq: Once | INTRAVENOUS | Status: AC
  Administered 2013-09-05: 10:00:00 via INTRAVENOUS

## 2013-09-05 MED ORDER — MIDAZOLAM HCL 2 MG/2ML IJ SOLN
1.0000 mg | INTRAMUSCULAR | Status: DC | PRN
  Administered 2013-09-05: 2 mg via INTRAVENOUS
  Administered 2013-09-05 (×4): 1 mg via INTRAVENOUS

## 2013-09-05 MED ORDER — FENTANYL CITRATE 0.05 MG/ML IJ SOLN
25.0000 ug | INTRAMUSCULAR | Status: DC | PRN
  Administered 2013-09-05: 75 ug via INTRAVENOUS
  Administered 2013-09-05: 25 ug via INTRAVENOUS

## 2013-09-05 NOTE — Telephone Encounter (Signed)
Mr. Marmolejos called.  He wants  to send a "big thank you" your way for diagnosing his problem and sending him to [place] to have ablation done..  This is the first time in 6 months that he has been able to walk and is pain free.

## 2013-09-05 NOTE — Telephone Encounter (Addendum)
Mardene [name] please let him know it was my pleasure, and big thanks to Dr. Jola Baptist as well for his skill with radiofrequency facet denervation.    Dr. Jola Baptist, I tried to call you a few times but couldn't get through, seems as though all went well even with partial L4-5 denervation and the patient is happy!

## 2013-09-05 NOTE — Discharge Instructions (Addendum)
Radio Frequency Ablation Post Procedure Discharge Instructions  1. May resume a regular diet and any medications that you routinely take (including pain medications). 2. No driving day of procedure. 3. Upon discharge go home and rest for at least 4 hours.  May use an ice pack as needed to injection sites on back.  Ice to back 30 minutes on and 30 minutes off, all day. 4. May remove bandades later, today. 5. It is not unusual to be sore for several days after this procedure.    Please contact our office at [phone] for the following symptoms:   Fever greater than 100 degrees  Increased swelling, pain, or redness at injection site.   Thank you for visiting [facility].

## 2013-09-05 NOTE — Progress Notes (Signed)
One hour, 23 minutes sedation time.  jkl

## 2013-09-11 NOTE — Progress Notes (Signed)
Patient called me back to report he felt like "a new man" this past Saturday and Sunday after his RFA on Friday, 09/05/13.  The left side (Dr. Jola Baptist treated L3 and L4 bilaterally) has remained feeling "good."  The right side "was great" but started to worsen on Sunday and Monday.  He states his symptoms are not as bad as pre-procedure, but he just wanted to let us know.  He understands it can take several days for the inflammation at the treated sites to resolve and that Dr. Jola Baptist did inject small doses of steroid solution at all four sites.  He could tell steroids were used because he got jittery.  He knows Dr. Jola Baptist won't be back here until 09/26/13, so he will call Dr. Helane Rima with any concerns in the meantime, if needed.  jkl

## 2013-09-16 ENCOUNTER — Ambulatory Visit (INDEPENDENT_AMBULATORY_CARE_PROVIDER_SITE_OTHER): Payer: 59 | Admitting: Sports Medicine

## 2013-09-16 ENCOUNTER — Encounter: Payer: Self-pay | Admitting: Sports Medicine

## 2013-09-16 VITALS — BP 126/90 | HR 83 | Ht 72.0 in | Wt 311.0 lb

## 2013-09-16 DIAGNOSIS — M4726 Other spondylosis with radiculopathy, lumbar region: Secondary | ICD-10-CM

## 2013-09-16 DIAGNOSIS — M47817 Spondylosis without myelopathy or radiculopathy, lumbosacral region: Secondary | ICD-10-CM

## 2013-09-16 MED ORDER — DICLOFENAC SODIUM 75 MG PO TBEC: 75.0000 mg | DELAYED_RELEASE_TABLET | Freq: Two times a day (BID) | ORAL | Status: DC

## 2013-09-16 MED ORDER — TRAMADOL HCL 50 MG PO TABS: ORAL_TABLET | ORAL | Status: DC

## 2013-09-16 MED ORDER — PREDNISONE (PAK) 10 MG PO TABS: ORAL_TABLET | ORAL | Status: DC

## 2013-09-16 NOTE — Progress Notes (Signed)
  Subjective:    CC: Followup  HPI: Lumbar facet arthritis: Good response to medial branch blocks for the L3-L4 and L4-L5 and asthma he is now post radiofrequency ablation of the bilateral L3-L4 facets, unfortunately the L4-L5 facets were not able to be totally denervated. His pain is significantly better, but he continues to have pain worse on the right side. Symptoms are mild, persistent.  Past medical history, Surgical history, Family history not pertinant except as noted below, Social history, Allergies, and medications have been entered into the medical record, reviewed, and no changes needed.   Review of Systems: No fevers, chills, night sweats, weight loss, chest pain, or shortness of breath.   Objective:    General: Well Developed, well nourished, and in no acute distress.  Neuro: Alert and oriented x3, extra-ocular muscles intact, sensation grossly intact.  HEENT: Normocephalic, atraumatic, pupils equal round reactive to light, neck supple, no masses, no lymphadenopathy, thyroid nonpalpable.  Skin: Warm and dry, no rashes. Cardiac: Regular rate and rhythm, no murmurs rubs or gallops, no lower extremity edema.  Respiratory: Clear to auscultation bilaterally. Not using accessory muscles, speaking in full sentences.  Impression and Recommendations:

## 2013-09-16 NOTE — Assessment & Plan Note (Signed)
Excellent response to radiofrequency ablation, unfortunately only the L3-L4 facets bilaterally were able to be ablated due to technical reasons. Switched to Voltaren, adding diclofenac and refilling tramadol. I think it is certainly worth a try to repeat ablation of the L4-L5 facets bilaterally.

## 2013-10-01 ENCOUNTER — Other Ambulatory Visit: Payer: Self-pay | Admitting: *Deleted

## 2013-10-01 MED ORDER — LISINOPRIL-HYDROCHLOROTHIAZIDE 20-25 MG PO TABS: ORAL_TABLET | ORAL | Status: DC

## 2013-11-03 ENCOUNTER — Other Ambulatory Visit: Payer: Self-pay | Admitting: Sports Medicine

## 2013-11-06 ENCOUNTER — Other Ambulatory Visit: Payer: Self-pay

## 2013-11-06 DIAGNOSIS — M4726 Other spondylosis with radiculopathy, lumbar region: Secondary | ICD-10-CM

## 2013-11-06 NOTE — Telephone Encounter (Signed)
There is no surgical procedure for facet mediated pain, surgery is for disc mediated pain and trauma. He had a good response to radiofrequency ablation however the L4-L5 facets were unable to be ablated due to technical reasons. I'm happy to do the referral, however the next step would be radio frequency ablation of the L4-L5 facet joints. Please let me know how to proceed.  Again, I'm still happy to place the referral.

## 2013-11-06 NOTE — Telephone Encounter (Signed)
Travis Huff called and states he is ready for a surgical consult for his back pain. He would like to be referred to Dr. Marchia Meiers. [name], MD: [facility] Address: [address] [place], [place], [place] [address] [phone]. Please advise.   He also wants a refill on his Tramadol.

## 2013-11-07 NOTE — Telephone Encounter (Signed)
Left detailed message.   

## 2013-11-12 MED ORDER — TRAMADOL HCL 50 MG PO TABS: ORAL_TABLET | ORAL | Status: DC

## 2013-11-12 NOTE — Telephone Encounter (Signed)
Patient agreed to the the radio frequency ablation. He also wanted to see a surgeon for his spinal stenosis. Can he have a refill on the tramadol?

## 2013-11-19 ENCOUNTER — Telehealth: Payer: Self-pay | Admitting: Sports Medicine

## 2013-11-19 DIAGNOSIS — M47816 Spondylosis without myelopathy or radiculopathy, lumbar region: Secondary | ICD-10-CM

## 2013-11-19 NOTE — Telephone Encounter (Signed)
Dr.T Please see note below. Jadia Capers,CMA  

## 2013-11-19 NOTE — Telephone Encounter (Signed)
Orders placed.

## 2013-11-19 NOTE — Telephone Encounter (Signed)
Patient is in extreme pain and wants to have the second part of the abalation set up at [facility].  Can someone plese set this up and contact the patient?

## 2013-11-19 NOTE — Telephone Encounter (Signed)
Left message on [facility] vm to call and schedule patient. Vonnie Spagnolo,CMA

## 2013-11-24 ENCOUNTER — Telehealth: Payer: Self-pay | Admitting: *Deleted

## 2013-11-24 MED ORDER — DIAZEPAM 5 MG PO TABS: ORAL_TABLET | ORAL | Status: DC

## 2013-11-24 NOTE — Telephone Encounter (Signed)
Travis Huff called and asked if you would refill his valium for a procedure he is having done on 12/01/13. He is scheduled to have L4&L5 ablation. Please advise. Margette Fast, CMA

## 2013-11-24 NOTE — Telephone Encounter (Signed)
Prescription is in my box

## 2013-12-01 ENCOUNTER — Other Ambulatory Visit: Payer: Self-pay | Admitting: Sports Medicine

## 2013-12-01 ENCOUNTER — Ambulatory Visit
Admission: RE | Admit: 2013-12-01 | Discharge: 2013-12-01 | Disposition: A | Discharge status: Home / Self Care | Payer: 59 | Source: Ambulatory Visit | Attending: Sports Medicine | Admitting: Sports Medicine

## 2013-12-01 DIAGNOSIS — M47816 Spondylosis without myelopathy or radiculopathy, lumbar region: Secondary | ICD-10-CM

## 2013-12-01 DIAGNOSIS — M4716 Other spondylosis with myelopathy, lumbar region: Secondary | ICD-10-CM

## 2013-12-01 MED ORDER — SODIUM CHLORIDE 0.9 % IV SOLN
Freq: Once | INTRAVENOUS | Status: AC
  Administered 2013-12-01: 10:00:00 via INTRAVENOUS

## 2013-12-01 MED ORDER — KETOROLAC TROMETHAMINE 30 MG/ML IJ SOLN
30.0000 mg | Freq: Once | INTRAMUSCULAR | Status: AC
  Administered 2013-12-01: 30 mg via INTRAVENOUS

## 2013-12-01 MED ORDER — FENTANYL CITRATE 0.05 MG/ML IJ SOLN
25.0000 ug | INTRAMUSCULAR | Status: DC | PRN
  Administered 2013-12-01: 100 ug via INTRAVENOUS

## 2013-12-01 MED ORDER — METHYLPREDNISOLONE ACETATE 40 MG/ML INJ SUSP (RADIOLOG
40.0000 mg | Freq: Once | INTRAMUSCULAR | Status: AC
  Administered 2013-12-01: 40 mg via INTRA_ARTICULAR

## 2013-12-01 MED ORDER — MIDAZOLAM HCL 2 MG/2ML IJ SOLN
1.0000 mg | INTRAMUSCULAR | Status: DC | PRN
  Administered 2013-12-01: 1 mg via INTRAVENOUS
  Administered 2013-12-01: 2 mg via INTRAVENOUS

## 2013-12-01 NOTE — Progress Notes (Signed)
Sedation time 50 minutes for L4-L5 bilateral RFA.  jkl

## 2013-12-01 NOTE — Discharge Instructions (Addendum)
Radiofrequency Ablation Discharge Instructions  1. You may resume a regular diet and any medications that you routinely take. 2. No driving the day of the procedure. 3. Please refrain from any strenuous activities today.  Go home and rest.  You may use an ice pack as needed to injection sites on back. 4. Remove bandaids later today.   Please contact our office at [phone] for the following symptoms:   Fever greater than 100 degrees  Increased swelling, pain, or redness at injection site.   Thank you for visiting [facility].

## 2013-12-01 NOTE — Progress Notes (Signed)
No antibiotics, per Dr. Jola Baptist.  jkl

## 2013-12-03 ENCOUNTER — Telehealth: Payer: Self-pay

## 2013-12-03 DIAGNOSIS — M47816 Spondylosis without myelopathy or radiculopathy, lumbar region: Secondary | ICD-10-CM

## 2013-12-03 NOTE — Telephone Encounter (Signed)
Patient request a referral to see Dierdre Harness at [facility]. Dwain Huhn,CMA

## 2013-12-04 NOTE — Telephone Encounter (Signed)
Referral placed.

## 2013-12-08 ENCOUNTER — Other Ambulatory Visit: Payer: Self-pay | Admitting: Sports Medicine

## 2013-12-15 ENCOUNTER — Telehealth: Payer: Self-pay

## 2013-12-15 MED ORDER — HYDROCODONE-ACETAMINOPHEN 5-325 MG PO TABS: 1.0000 | ORAL_TABLET | Freq: Three times a day (TID) | ORAL | Status: DC | PRN

## 2013-12-15 NOTE — Telephone Encounter (Signed)
Small course of hydrocodone prescribed, did he improve at all yet from the completion of the radiofrequency ablation? If pain is all axial/in the back, without much of a radicular component/going down the leg then he is not a surgical candidate, and they will likely recommend medical pain management.

## 2013-12-15 NOTE — Telephone Encounter (Signed)
Travis Huff reports he is still having mid low back pain. He is still waiting on Travis Huff to call him for an appointment. He states the tramadol and naproxen is not really helping with the pain. He would like something stronger until his appointment with Travis Huff. Please advise.

## 2013-12-16 NOTE — Telephone Encounter (Signed)
Umer did have relief on the sides. He reports numbness in both legs.

## 2013-12-21 ENCOUNTER — Observation Stay (HOSPITAL_COMMUNITY): Payer: 59

## 2013-12-21 ENCOUNTER — Encounter (HOSPITAL_COMMUNITY): Payer: Self-pay | Admitting: *Deleted

## 2013-12-21 ENCOUNTER — Observation Stay (HOSPITAL_COMMUNITY)
Admission: AD | Admit: 2013-12-21 | Discharge: 2013-12-22 | Disposition: A | Discharge status: Home / Self Care | Payer: 59 | Source: Ambulatory Visit | Attending: Neurosurgery | Admitting: Neurosurgery

## 2013-12-21 DIAGNOSIS — Z79899 Other long term (current) drug therapy: Secondary | ICD-10-CM | POA: Insufficient documentation

## 2013-12-21 DIAGNOSIS — J45909 Unspecified asthma, uncomplicated: Secondary | ICD-10-CM | POA: Insufficient documentation

## 2013-12-21 DIAGNOSIS — M48062 Spinal stenosis, lumbar region with neurogenic claudication: Secondary | ICD-10-CM | POA: Diagnosis present

## 2013-12-21 DIAGNOSIS — M4806 Spinal stenosis, lumbar region: Secondary | ICD-10-CM | POA: Diagnosis not present

## 2013-12-21 DIAGNOSIS — R200 Anesthesia of skin: Secondary | ICD-10-CM | POA: Insufficient documentation

## 2013-12-21 DIAGNOSIS — M419 Scoliosis, unspecified: Secondary | ICD-10-CM | POA: Diagnosis present

## 2013-12-21 DIAGNOSIS — Z885 Allergy status to narcotic agent status: Secondary | ICD-10-CM | POA: Insufficient documentation

## 2013-12-21 DIAGNOSIS — I10 Essential (primary) hypertension: Secondary | ICD-10-CM | POA: Insufficient documentation

## 2013-12-21 DIAGNOSIS — M549 Dorsalgia, unspecified: Secondary | ICD-10-CM | POA: Diagnosis present

## 2013-12-21 LAB — COMPREHENSIVE METABOLIC PANEL
ALT: 23 U/L (ref 0–53)
AST: 21 U/L (ref 0–37)
Albumin: 3.5 g/dL (ref 3.5–5.2)
Alkaline Phosphatase: 81 U/L (ref 39–117)
Anion gap: 14 (ref 5–15)
BUN: 27 mg/dL — ABNORMAL HIGH (ref 6–23)
CO2: 24 mEq/L (ref 19–32)
Calcium: 9.4 mg/dL (ref 8.4–10.5)
Chloride: 101 mEq/L (ref 96–112)
Creatinine, Ser: 0.94 mg/dL (ref 0.50–1.35)
GFR calc Af Amer: >90 mL/min (ref >90)
GFR calc non Af Amer: >90 mL/min (ref >90)
Glucose, Bld: 92 mg/dL (ref 70–99)
Potassium: 3.9 mEq/L (ref 3.7–5.3)
Sodium: 139 mEq/L (ref 137–147)
Total Bilirubin: 0.3 mg/dL (ref 0.3–1.2)
Total Protein: 6.6 g/dL (ref 6.0–8.3)

## 2013-12-21 LAB — CBC WITH DIFFERENTIAL/PLATELET
Basophils Absolute: 0 10*3/uL (ref 0.0–0.1)
Basophils Relative: 0 % (ref 0–1)
Eosinophils Absolute: 0.1 10*3/uL (ref 0.0–0.7)
Eosinophils Relative: 1 % (ref 0–5)
HCT: 42.8 % (ref 39.0–52.0)
Hemoglobin: 14.3 g/dL (ref 13.0–17.0)
Lymphocytes Relative: 17 % (ref 12–46)
Lymphs Abs: 1.5 10*3/uL (ref 0.7–4.0)
MCH: 30.6 pg (ref 26.0–34.0)
MCHC: 33.4 g/dL (ref 30.0–36.0)
MCV: 91.5 fL (ref 78.0–100.0)
Monocytes Absolute: 0.7 10*3/uL (ref 0.1–1.0)
Monocytes Relative: 8 % (ref 3–12)
Neutro Abs: 6.3 10*3/uL (ref 1.7–7.7)
Neutrophils Relative %: 74 % (ref 43–77)
Platelets: 175 10*3/uL (ref 150–400)
RBC: 4.68 MIL/uL (ref 4.22–5.81)
RDW: 12.9 % (ref 11.5–15.5)
WBC: 8.6 10*3/uL (ref 4.0–10.5)

## 2013-12-21 LAB — APTT: aPTT: 29 seconds (ref 24–37)

## 2013-12-21 LAB — PROTIME-INR
INR: 0.99 (ref 0.00–1.49)
Prothrombin Time: 13.2 seconds (ref 11.6–15.2)

## 2013-12-21 MED ORDER — SODIUM CHLORIDE 0.9 % IJ SOLN: 3.0000 mL | INTRAMUSCULAR | Status: DC | PRN

## 2013-12-21 MED ORDER — BISACODYL 5 MG PO TBEC: 5.0000 mg | DELAYED_RELEASE_TABLET | Freq: Every day | ORAL | Status: DC | PRN

## 2013-12-21 MED ORDER — ONDANSETRON HCL 4 MG PO TABS: 4.0000 mg | ORAL_TABLET | Freq: Four times a day (QID) | ORAL | Status: DC | PRN

## 2013-12-21 MED ORDER — HYDROCHLOROTHIAZIDE 25 MG PO TABS
25.0000 mg | ORAL_TABLET | Freq: Every day | ORAL | Status: DC
  Administered 2013-12-22: 25 mg via ORAL
  Filled 2013-12-21 (×2): qty 1

## 2013-12-21 MED ORDER — HEPARIN SODIUM (PORCINE) 5000 UNIT/ML IJ SOLN
5000.0000 [IU] | Freq: Three times a day (TID) | INTRAMUSCULAR | Status: DC
  Administered 2013-12-21 – 2013-12-22 (×2): 5000 [IU] via SUBCUTANEOUS
  Filled 2013-12-21 (×2): qty 1

## 2013-12-21 MED ORDER — SODIUM CHLORIDE 0.9 % IV SOLN: 250.0000 mL | INTRAVENOUS | Status: DC | PRN

## 2013-12-21 MED ORDER — LISINOPRIL-HYDROCHLOROTHIAZIDE 20-25 MG PO TABS: 1.0000 | ORAL_TABLET | Freq: Every day | ORAL | Status: DC

## 2013-12-21 MED ORDER — MAGNESIUM CITRATE PO SOLN
1.0000 | Freq: Once | ORAL | Status: AC | PRN
  Filled 2013-12-21: qty 296

## 2013-12-21 MED ORDER — SODIUM CHLORIDE 0.9 % IJ SOLN
3.0000 mL | Freq: Two times a day (BID) | INTRAMUSCULAR | Status: DC
  Administered 2013-12-21 – 2013-12-22 (×2): 3 mL via INTRAVENOUS

## 2013-12-21 MED ORDER — POLYETHYLENE GLYCOL 3350 17 G PO PACK: 17.0000 g | PACK | Freq: Every day | ORAL | Status: DC | PRN

## 2013-12-21 MED ORDER — OXYCODONE HCL 5 MG PO TABS
5.0000 mg | ORAL_TABLET | ORAL | Status: DC | PRN
  Administered 2013-12-21 – 2013-12-22 (×3): 5 mg via ORAL
  Filled 2013-12-21 (×3): qty 1

## 2013-12-21 MED ORDER — HYDROCODONE-ACETAMINOPHEN 5-325 MG PO TABS: 1.0000 | ORAL_TABLET | Freq: Three times a day (TID) | ORAL | Status: DC | PRN

## 2013-12-21 MED ORDER — LISINOPRIL 20 MG PO TABS
20.0000 mg | ORAL_TABLET | Freq: Every day | ORAL | Status: DC
  Administered 2013-12-22: 20 mg via ORAL
  Filled 2013-12-21 (×2): qty 1

## 2013-12-21 MED ORDER — POTASSIUM CHLORIDE IN NACL 20-0.9 MEQ/L-% IV SOLN
INTRAVENOUS | Status: DC
  Administered 2013-12-21: 1000 mL via INTRAVENOUS
  Filled 2013-12-21 (×2): qty 1000

## 2013-12-21 MED ORDER — ACETAMINOPHEN 650 MG RE SUPP: 650.0000 mg | Freq: Four times a day (QID) | RECTAL | Status: DC | PRN

## 2013-12-21 MED ORDER — KETOROLAC TROMETHAMINE 30 MG/ML IJ SOLN
30.0000 mg | Freq: Four times a day (QID) | INTRAMUSCULAR | Status: DC
  Administered 2013-12-22 (×2): 30 mg via INTRAVENOUS
  Filled 2013-12-21 (×2): qty 1

## 2013-12-21 MED ORDER — SENNA 8.6 MG PO TABS
1.0000 | ORAL_TABLET | Freq: Two times a day (BID) | ORAL | Status: DC
  Filled 2013-12-21 (×2): qty 1

## 2013-12-21 MED ORDER — ONDANSETRON HCL 4 MG/2ML IJ SOLN: 4.0000 mg | Freq: Four times a day (QID) | INTRAMUSCULAR | Status: DC | PRN

## 2013-12-21 MED ORDER — ACETAMINOPHEN 325 MG PO TABS: 650.0000 mg | ORAL_TABLET | Freq: Four times a day (QID) | ORAL | Status: DC | PRN

## 2013-12-21 NOTE — H&P (Signed)
Travis Huff is an 53 y.o. male.   Chief Complaint: whom has had back pain for a number of years. His first MRI of the lumbar spine in the [facility] which is viewable was performed in 2009. He states his symptoms at that time did not include a very severe numbness from the waist down. He states that when he stands both lower extremities well become numb, and this led to his fall this past Friday, 12/18. He only fell once, but admits to having this feeling quite often. On those other occasions he was able to find a place to sit. He has continued to work regularly.  He has undergone epidural injections, radiofrequency ablations to the lumbar nerves without lasting relief. He denies bowel/bladder dysfunction. While he admits to pain, it is the numbness which he finds most distressing.  I was called by his wife today secondary to the fall, and increasing difficulty walking.  HPI: as above  Past Medical History  Diagnosis Date  . Hypertension   . Asthma   . Viral pericarditis     Past Surgical History  Procedure Laterality Date  . Knee surgery    . Tendon repair      right hand  . Cardiovascular stress test      Negative in May 2014    Family History  Problem Relation Age of Onset  . Adopted: Yes   Social History:  reports that he has quit smoking. He does not have any smokeless tobacco history on file. He reports that he drinks alcohol. He reports that he does not use illicit drugs.  Allergies:  Allergies  Allergen Reactions  . Dilaudid [Hydromorphone Hcl] Itching and Other (See Comments)    Couldn't sleep  . Morphine And Related Itching    Medications Prior to Admission  Medication Sig Dispense Refill  . CIALIS 5 MG tablet TAKE 1 TABLET BY MOUTH DAILY *NEED APPOINTMENT* 30 tablet 0  . diclofenac (VOLTAREN) 75 MG EC tablet Take 1 tablet (75 mg total) by mouth 2 (two) times daily. 60 tablet 3  . HYDROcodone-acetaminophen (NORCO/VICODIN) 5-325 MG per tablet Take 1 tablet by  mouth every 8 (eight) hours as needed for moderate pain. 40 tablet 0  . lisinopril-hydrochlorothiazide (PRINZIDE,ZESTORETIC) 20-25 MG per tablet TAKE 1 TABLET BY MOUTH DAILY. 30 tablet 5  . naproxen sodium (ANAPROX) 220 MG tablet Take 220 mg by mouth 2 (two) times daily with a meal.    . traMADol (ULTRAM) 50 MG tablet 1-2 tabs by mouth Q8 hours, maximum 6 tabs per day. 60 tablet 0  . diazepam (VALIUM) 5 MG tablet Take 1 tab PO 1 hour before procedure or imaging. (Patient not taking: Reported on 12/21/2013) 2 tablet 0  . predniSONE (STERAPRED UNI-PAK) 10 MG tablet 12 day taper pack, use as directed (Patient not taking: Reported on 12/21/2013) 1 tablet 0    No results found for this or any previous visit (from the past 48 hour(s)). No results found.  Review of Systems  HENT: Negative.   Eyes: Negative.   Respiratory: Negative.   Cardiovascular: Negative.   Gastrointestinal: Negative.   Musculoskeletal: Positive for back pain and falls.  Skin: Negative.   Neurological: Positive for weakness.  Endo/Heme/Allergies: Negative.   Psychiatric/Behavioral: Negative.     Blood pressure 114/84, pulse 89, temperature 99.3 F (37.4 C), temperature source Oral, resp. rate 18, height 6' (1.829 m), weight 133.358 kg (294 lb), SpO2 97 %. Physical Exam  Constitutional: He is oriented to  person, place, and time. He appears well-developed and well-nourished. He appears distressed.  HENT:  Head: Normocephalic and atraumatic.  Right Ear: External ear normal.  Left Ear: External ear normal.  Nose: Nose normal.  Mouth/Throat: No oropharyngeal exudate.  Eyes: Conjunctivae and EOM are normal. Pupils are equal, round, and reactive to light. Right eye exhibits no discharge. Left eye exhibits no discharge. No scleral icterus.  Neck: Normal range of motion. Neck supple.  Cardiovascular: Normal rate and regular rhythm.   Respiratory: Effort normal.  Musculoskeletal: Normal range of motion.  Neurological: He is  alert and oriented to person, place, and time. He has normal reflexes. A sensory deficit is present. No cranial nerve deficit. He exhibits normal muscle tone. He displays a negative Romberg sign. Gait abnormal. Coordination normal. He displays no Babinski's sign on the right side. He displays no Babinski's sign on the left side.  Markedly decreased proprioception great toes bilaterally, normal at right ankle, diminished on left Able to walk on toes, heel walk, and do a deep knee bend Romberg is negative, gait is antalgic  Skin: He is not diaphoretic.     Assessment/Plan Admit for MRI lumbar spine. His history has changed since the most recent scan in August, 2015. He is scoliotic, and does have significant facet arthropathy, especially on the left at 3/4,4/5, and L5/S1.  I will control his pain with medications, and have pt see him for gait difficulties. His daughter has been taken care of by Dr. Erline Levine, who will see Travis Huff, tomorrow. He was scheduled to see him sometime in January. Travis Huff L 12/21/2013, 6:26 PM

## 2013-12-22 DIAGNOSIS — M4806 Spinal stenosis, lumbar region: Secondary | ICD-10-CM | POA: Diagnosis not present

## 2013-12-22 MED ORDER — HYDROCODONE-ACETAMINOPHEN 5-325 MG PO TABS: 1.0000 | ORAL_TABLET | Freq: Three times a day (TID) | ORAL | Status: DC | PRN

## 2013-12-22 NOTE — Progress Notes (Signed)
Subjective: Patient reports "I don't have much pain right now except when I raise this right leg, it hurts a little on that right side"  Objective: Vital signs in last 24 hours: Temp:  [97.5 F (36.4 C)-99.3 F (37.4 C)] 98.1 F (36.7 C) (12/21 0558) Pulse Rate:  [63-89] 63 (12/21 0558) Resp:  [18] 18 (12/21 0558) BP: (114-124)/(67-84) 122/77 mmHg (12/21 0558) SpO2:  [94 %-100 %] 100 % (12/21 0558) Weight:  [133.358 kg (294 lb)] 133.358 kg (294 lb) (12/20 1748)  Intake/Output from previous day:   Intake/Output this shift:    Alert, up in chair. Reports 4 yr hx of intermittent BLE numbness, with episodes becoming more frequent in the last few months, and resulting in a fall on Friday. He reports his pain as mild to moderate "at the base of my spine" "grinding and crunching" lately.  Strength is full BLE to confrontational testing, eliciting mild right side lumbar pain with flexion of the right hip against gravity while seated.   Lab Results:  Recent Labs  12/21/13 1849  WBC 8.6  HGB 14.3  HCT 42.8  PLT 175   BMET  Recent Labs  12/21/13 1849  NA 139  K 3.9  CL 101  CO2 24  GLUCOSE 92  BUN 27*  CREATININE 0.94  CALCIUM 9.4    Studies/Results: Dg Lumbar Spine Complete  12/21/2013   CLINICAL DATA:  Progressive low back pain. History of spinal stenosis and scoliosis.  EXAM: LUMBAR SPINE - COMPLETE 4+ VIEW  COMPARISON:  Lumbar MRI 12/21/2013 and 05/06/2011.  FINDINGS: There are 5 lumbar type vertebral bodies with a vestigial right-sided rib at L1. There is a convex left scoliosis measuring approximately 19 degrees and centered at L2. There is a grade 1 retrolisthesis at L2-3 and L3-4. There is disc space loss with paraspinal osteophyte formation and facet hypertrophy throughout the lumbar spine. Lateral flexion and extension views demonstrate limited range of motion, but no instability.  IMPRESSION: Diffuse lumbar spondylosis with stable alignment. No evidence of dynamic  instability.   Electronically Signed   By: Camie Patience M.D.   On: 12/21/2013 20:39   Mr Lumbar Spine Wo Contrast  12/21/2013   CLINICAL DATA:  History of scoliosis with chronic low back pain, with with worsening pain and numbness.  EXAM: MRI LUMBAR SPINE WITHOUT CONTRAST  TECHNIQUE: Multiplanar, multisequence MR imaging of the lumbar spine was performed. No intravenous contrast was administered.  COMPARISON:  Prior MRI from 08/16/2013  FINDINGS: For the purposes of this dictation, the lowest well-formed intervertebral disc spaces presumed to be the L5-S1 level, and there presumed to be 5 lumbar type vertebral bodies.  Left convex scoliosis with apex at L2-3 again seen. Vertebral body heights are stable without acute fracture.  Conus medullaris terminates normally at the L1-2 level. Signal intensity within the visualized cord is normal. Nerve roots of the cauda equina within normal limits.  Paraspinous soft tissues within normal limits. 2.4 cm mass within the medial limb of the left adrenal gland again noted, stable from prior, and most consistent with a benign adenoma.  T12-L1: Degenerative endplate Schmorl's node present at the superior endplate of Z61, stable. Small right paracentral disc protrusion again noted, minimally indenting the right ventral thecal sac without significant stenosis.  L1-2: 4 mm of retrolisthesis of L1 on L2 is stable. Diffuse disc bulge with disc desiccation present. There is moderate right-sided facet arthrosis. Resultant severe right foraminal narrowing at this level is not significantly changed. Mild  canal narrowing stable.  L2-3: Diffuse disc bulge with disc desiccation. Endplate osteophytic spurring present. Small right paracentral disc protrusion indenting the right ventral thecal sac and encroaching upon the right lateral recess is similar relative to prior study (series 8, image 16). This could potentially result in impingement of the traversing right L3 nerve root. Moderate  bilateral facet arthrosis, right worse than left, with ligamentum flavum hypertrophy. Mild right foraminal stenosis due to facet arthrosis and disc bulge is stable. The left neural foramen remains widely patent. Mild canal narrowing stable.  L3-4: Diffuse disc bulge, slightly eccentric to the left, with disc desiccation. No no focal disc herniation. Epidural lipomatosis present. Moderate right facet arthrosis with ligamentum flavum hypertrophy. Resultant moderate canal and bilateral foraminal narrowing stable from prior.  L4-5: Diffuse disc bulge with disc desiccation. Severe bilateral facet arthrosis with ligamentum flavum hypertrophy Stable. Effusions again noted within the facet joints bilaterally. There is disc bulge with disc desiccation. Probable tiny central disc protrusion superimposed at this level. There is severe canal and lateral recess stenosis bilaterally. Severe left foraminal narrowing is unchanged. Right neural foramen remains widely patent.  L5-S1: Severe bilateral facet arthrosis, left greater than right. Disc desiccation with disc bulge. Mild epidural lipomatosis. Mild canal narrowing at this level a is stable. Severe left foraminal stenosis is stable. This could potentially effect the transiting left L5 nerve root. No significant right neural foraminal narrowing.  IMPRESSION: 1. Similar appearance of the lumbar spine relative to most recent MRI from 08/16/2013. 2. Stable severe canal and lateral recess stenosis at L4-5 due to disc bulge and severe bilateral facet arthrosis. 3. Stable small right paracentral disc protrusion at L2-3 partially impinging upon the right lateral recess. 4. Severe right foraminal stenosis at L1-2 due to disc bulge and facet arthropathy. 5. Severe left-sided foraminal narrowing at L5-S1 due to disc bulge and facet arthropathy. 6. Left convex scoliosis with apex at L2-3.   Electronically Signed   By: Jeannine Boga M.D.   On: 12/21/2013 20:47     Assessment/Plan: Stable   LOS: 1 day  Dr. [name] will visit to discuss MRI findings this AM and formulate a plan for tx on outpatient basis.    Verdis Prime 12/22/2013, 8:31 AM

## 2013-12-22 NOTE — Progress Notes (Signed)
Discharge orders received.  Discharge instructions and follow-up appointments reviewed with the patient and his wife.  IV removed and education complete.  Transported out via wheelchair, wife present.   Cori Razor, RN

## 2013-12-22 NOTE — Discharge Summary (Signed)
Physician Discharge Summary  Patient ID: Travis Huff MRN: [id number] DOB/AGE: May 29, 1960 53 y.o.  Admit date: 12/21/2013 Discharge date: 12/22/2013  Admission Diagnoses: Lumbar and BLE pain and numbness  Discharge Diagnoses: Lumbar and BLE pain and numbness, controlled Active Problems:   Spinal stenosis, lumbar region, with neurogenic claudication   Lumbar spine scoliosis   Discharged Condition: good  Hospital Course: Travis Huff was admitted for evaluation of increasing frequency in BLE numbness and pain. MRI was obtained for comparison. Discussion of plan for conservative tx vs possibility of surgery.   Consults: None  Significant Diagnostic Studies: radiology: x-ray and MRI  Treatments:   Discharge Exam: Blood pressure 118/78, pulse 71, temperature 99.3 F (37.4 C), temperature source Oral, resp. rate 20, height 6' (1.829 m), weight 133.358 kg (294 lb), SpO2 95 %. Alert, up in chair. Reports 4 yr hx of intermittent BLE numbness, with episodes becoming more frequent in the last few months, and resulting in a fall on Friday. He reports his pain as mild to moderate "at the base of my spine" "grinding and crunching" lately.  Strength is full BLE to confrontational testing, eliciting mild right side lumbar pain with flexion of the right hip against gravity while seated.    Disposition: 01-Home or Self Care Pt will follow up in office as outpatient. Norco 5/325 Rx to pt for prn use. Patient may return to regular job duties 12/23/13.     Medication List    TAKE these medications        CIALIS 5 MG tablet  Generic drug:  tadalafil  TAKE 1 TABLET BY MOUTH DAILY *NEED APPOINTMENT*     diazepam 5 MG tablet  Commonly known as:  VALIUM  Take 1 tab PO 1 hour before procedure or imaging.     diclofenac 75 MG EC tablet  Commonly known as:  VOLTAREN  Take 1 tablet (75 mg total) by mouth 2 (two) times daily.     HYDROcodone-acetaminophen 5-325 MG per tablet  Commonly known  as:  NORCO/VICODIN  Take 1 tablet by mouth every 8 (eight) hours as needed for moderate pain.     HYDROcodone-acetaminophen 5-325 MG per tablet  Commonly known as:  NORCO/VICODIN  Take 1 tablet by mouth every 8 (eight) hours as needed for moderate pain.     lisinopril-hydrochlorothiazide 20-25 MG per tablet  Commonly known as:  PRINZIDE,ZESTORETIC  TAKE 1 TABLET BY MOUTH DAILY.     naproxen sodium 220 MG tablet  Commonly known as:  ANAPROX  Take 220 mg by mouth 2 (two) times daily with a meal.     predniSONE 10 MG tablet  Commonly known as:  STERAPRED UNI-PAK  12 day taper pack, use as directed     traMADol 50 MG tablet  Commonly known as:  ULTRAM  1-2 tabs by mouth Q8 hours, maximum 6 tabs per day.         Signed: Verdis Prime 12/22/2013, 11:25 AM

## 2014-02-04 ENCOUNTER — Other Ambulatory Visit: Payer: Self-pay | Admitting: Sports Medicine

## 2014-02-05 ENCOUNTER — Encounter: Payer: Self-pay | Admitting: Sports Medicine

## 2014-02-05 ENCOUNTER — Ambulatory Visit (INDEPENDENT_AMBULATORY_CARE_PROVIDER_SITE_OTHER): Payer: 59 | Admitting: Sports Medicine

## 2014-02-05 VITALS — BP 137/93 | HR 82 | Ht 72.0 in | Wt 293.0 lb

## 2014-02-05 DIAGNOSIS — M1611 Unilateral primary osteoarthritis, right hip: Secondary | ICD-10-CM

## 2014-02-05 DIAGNOSIS — M4806 Spinal stenosis, lumbar region: Secondary | ICD-10-CM

## 2014-02-05 DIAGNOSIS — M48062 Spinal stenosis, lumbar region with neurogenic claudication: Secondary | ICD-10-CM

## 2014-02-05 NOTE — Assessment & Plan Note (Signed)
Greater than 5 months of relief from the last injection. Repeat injection today, return in one month.

## 2014-02-05 NOTE — Assessment & Plan Note (Signed)
Status post multilevel facet radiofrequency ablation with resolution of axial low back pain. Still has multilevel degenerative disc disease with bilateral foraminal stenosis at several levels, he has discussed this with neurosurgery, he is currently doing physical therapy, surgery would require a 2 level fusion.

## 2014-02-05 NOTE — Progress Notes (Signed)
  Subjective:    CC: Right hip pain  HPI: Right hip osteoarthritis: Confirmed on a previous x-ray, previous femoral acetabular injection provided 5 months of relief, pain is moderate, persistent, localized in the right groin, desires repeat interventional treatment today.  Lumbar spondylosis: Good response to multilevel lumbar facet radiofrequency denervation, this pain has resolved, unfortunately continues to have bilateral radiculopathy, he has seen Dr. [name] with neurosurgery who recommended a 2 level fusion, but a course of physical therapy first.  Past medical history, Surgical history, Family history not pertinant except as noted below, Social history, Allergies, and medications have been entered into the medical record, reviewed, and no changes needed.   Review of Systems: No fevers, chills, night sweats, weight loss, chest pain, or shortness of breath.   Objective:    General: Well Developed, well nourished, and in no acute distress.  Neuro: Alert and oriented x3, extra-ocular muscles intact, sensation grossly intact.  HEENT: Normocephalic, atraumatic, pupils equal round reactive to light, neck supple, no masses, no lymphadenopathy, thyroid nonpalpable.  Skin: Warm and dry, no rashes. Cardiac: Regular rate and rhythm, no murmurs rubs or gallops, no lower extremity edema.  Respiratory: Clear to auscultation bilaterally. Not using accessory muscles, speaking in full sentences. Right Hip: ROM IR: 60 Deg, ER: 60 Deg, Flexion: 120 Deg, Extension: 100 Deg, Abduction: 45 Deg, Adduction: 45 Deg painful with internal rotation. Strength IR: 5/5, ER: 5/5, Flexion: 5/5, Extension: 5/5, Abduction: 5/5, Adduction: 5/5 Pelvic alignment unremarkable to inspection and palpation. Standing hip rotation and gait without trendelenburg / unsteadiness. Greater trochanter without tenderness to palpation. No tenderness over piriformis. No SI joint tenderness and normal minimal SI movement.  Procedure:  Real-time Ultrasound Guided Injection of right femoral acetabular joint Device: GE Logiq E  Verbal informed consent obtained.  Time-out conducted.  Noted no overlying erythema, induration, or other signs of local infection.  Skin prepped in a sterile fashion.  Local anesthesia: Topical Ethyl chloride.  With sterile technique and under real time ultrasound guidance:  Spinal needle advanced to the femoral head/neck junction, 2 mL kenalog 40, 4 mL lidocaine injected easily. Completed without difficulty  Pain immediately resolved suggesting accurate placement of the medication.  Advised to call if fevers/chills, erythema, induration, drainage, or persistent bleeding.  Images permanently stored and available for review in the ultrasound unit.  Impression: Technically successful ultrasound guided injection.  Impression and Recommendations:

## 2014-03-05 ENCOUNTER — Ambulatory Visit (INDEPENDENT_AMBULATORY_CARE_PROVIDER_SITE_OTHER): Payer: 59 | Admitting: Sports Medicine

## 2014-03-05 ENCOUNTER — Encounter: Payer: Self-pay | Admitting: Sports Medicine

## 2014-03-05 VITALS — BP 135/85 | HR 92 | Ht 72.0 in | Wt 292.0 lb

## 2014-03-05 DIAGNOSIS — M4806 Spinal stenosis, lumbar region: Secondary | ICD-10-CM

## 2014-03-05 DIAGNOSIS — M791 Myalgia: Secondary | ICD-10-CM | POA: Diagnosis not present

## 2014-03-05 DIAGNOSIS — M48062 Spinal stenosis, lumbar region with neurogenic claudication: Secondary | ICD-10-CM

## 2014-03-05 DIAGNOSIS — IMO0001 Reserved for inherently not codable concepts without codable children: Secondary | ICD-10-CM

## 2014-03-05 MED ORDER — GABAPENTIN 300 MG PO CAPS: ORAL_CAPSULE | ORAL | Status: DC

## 2014-03-05 NOTE — Progress Notes (Signed)
  Subjective:    CC: low back pain  HPI: Travis Huff is a pleasant 54 year old male, he has severe lumbar spinal stenosis, with unfortunate worsening progressive lower extremity weakness, increasing falls, and lower extremity numbness without saddle numbness or bowel or bladder dysfunction. He was seen by neurosurgery, they reviewed his recent lumbar MRI, and recommended more physical therapy, unfortunately he continues to worsen. He does have severe pain today at his right sacroiliac joint and wonders if interventional treatment here would be an option.  Right hip arthritis: Minimally painful. Overall better since last injection.  Past medical history, Surgical history, Family history not pertinant except as noted below, Social history, Allergies, and medications have been entered into the medical record, reviewed, and no changes needed.   Review of Systems: No fevers, chills, night sweats, weight loss, chest pain, or shortness of breath.   Objective:    General: Well Developed, well nourished, and in no acute distress.  Neuro: Alert and oriented x3, extra-ocular muscles intact, sensation grossly intact.  HEENT: Normocephalic, atraumatic, pupils equal round reactive to light, neck supple, no masses, no lymphadenopathy, thyroid nonpalpable.  Skin: Warm and dry, no rashes. Cardiac: Regular rate and rhythm, no murmurs rubs or gallops, no lower extremity edema.  Respiratory: Clear to auscultation bilaterally. Not using accessory muscles, speaking in full sentences. Back Exam:  Inspection: Unremarkable  Motion: Flexion 45 deg, Extension 45 deg, Side Bending to 45 deg bilaterally,  Rotation to 45 deg bilaterally  SLR laying: Negative  XSLR laying: Negative  Palpable tenderness: just medial to the right posterior superior iliac spine. FABER: negative. Sensory change: Gross sensation intact to all lumbar and sacral dermatomes.  Reflexes: 2+ at both patellar tendons, 2+ at achilles tendons,  Babinski's downgoing.  Strength at foot  Plantar-flexion: 5/5 Dorsi-flexion: 5/5 Eversion: 5/5 Inversion: 5/5  Leg strength  Quad: 5/5 Hamstring: 5/5 Hip flexor: 5/5 Hip abductors: 5/5  Gait unremarkable.  Procedure: Real-time Ultrasound Guided Injection of right sacroiliac joint Device: GE Logiq E  Verbal informed consent obtained.  Time-out conducted.  Noted no overlying erythema, induration, or other signs of local infection.  Skin prepped in a sterile fashion.  Local anesthesia: Topical Ethyl chloride.  With sterile technique and under real time ultrasound guidance:  Spinal needle advanced over the sacral premonitory and into the sacroiliac joint, 1 mL kenalog 40, 4 mL lidocaine injected easily. Completed without difficulty  The patient only noted minimal pain relief suggesting that the sacroiliac joint is not the principal pain generator. Advised to call if fevers/chills, erythema, induration, drainage, or persistent bleeding.  Images permanently stored and available for review in the ultrasound unit.  Impression: Technically successful ultrasound guided injection.  Impression and Recommendations:    I spent 40 minutes with this patient, greater than 50% was face-to-face time counseling regarding the below diagnosis

## 2014-03-05 NOTE — Assessment & Plan Note (Signed)
Unfortunately having progressive neurologic deficits, in the form of weakness, numbness, and worsening falls. Does have severe spinal stenosis at L4-5 and L5-S1. I think he has now become a surgical candidate and should return to Dr. [name] for consideration of two-level decompression. I'm going to block his sacral iliac joint today, he does have significant pain referable to this location, for diagnostic and therapeutic purposes. Return to see me in a month.

## 2014-03-06 ENCOUNTER — Telehealth: Payer: Self-pay

## 2014-03-06 DIAGNOSIS — M48061 Spinal stenosis, lumbar region without neurogenic claudication: Secondary | ICD-10-CM

## 2014-03-06 NOTE — Telephone Encounter (Signed)
Dr. Phylliss Bob at [facility].

## 2014-03-06 NOTE — Telephone Encounter (Signed)
Travis Huff called and left a message asking for the name of the Ortho Surgeon Dr Dianah Field recommends.

## 2014-03-09 NOTE — Telephone Encounter (Signed)
Called and spoke with Travis Huff to let him know that you recommend Travis Huff at [facility] and he asked if you could please place a referral to them for him. - CF

## 2014-03-09 NOTE — Telephone Encounter (Signed)
Referral placed.

## 2014-03-12 ENCOUNTER — Telehealth: Payer: Self-pay

## 2014-03-12 ENCOUNTER — Other Ambulatory Visit (HOSPITAL_BASED_OUTPATIENT_CLINIC_OR_DEPARTMENT_OTHER): Payer: Self-pay | Admitting: Neurosurgery

## 2014-03-12 NOTE — Telephone Encounter (Signed)
Wanda called and states he wants to thank Dr Dianah Field for all he has done. He is however going to stay with the Neurosurgeon. He has scheduled the surgery for April 14 th. Referral to Ortho can be cancelled.

## 2014-03-12 NOTE — Telephone Encounter (Signed)
Routing to [place] so she knows not to work on this.

## 2014-03-13 ENCOUNTER — Other Ambulatory Visit: Payer: Self-pay | Admitting: Family Medicine

## 2014-03-13 ENCOUNTER — Other Ambulatory Visit (HOSPITAL_BASED_OUTPATIENT_CLINIC_OR_DEPARTMENT_OTHER): Payer: Self-pay | Admitting: Neurosurgery

## 2014-03-13 DIAGNOSIS — M419 Scoliosis, unspecified: Secondary | ICD-10-CM

## 2014-03-14 ENCOUNTER — Ambulatory Visit (HOSPITAL_BASED_OUTPATIENT_CLINIC_OR_DEPARTMENT_OTHER)
Admission: RE | Admit: 2014-03-14 | Discharge: 2014-03-14 | Disposition: A | Discharge status: Home / Self Care | Payer: 59 | Source: Ambulatory Visit | Attending: Neurosurgery | Admitting: Neurosurgery

## 2014-03-14 DIAGNOSIS — M419 Scoliosis, unspecified: Secondary | ICD-10-CM | POA: Diagnosis not present

## 2014-03-14 DIAGNOSIS — R200 Anesthesia of skin: Secondary | ICD-10-CM | POA: Diagnosis not present

## 2014-03-26 ENCOUNTER — Encounter: Payer: Self-pay | Admitting: Sports Medicine

## 2014-03-26 ENCOUNTER — Ambulatory Visit (INDEPENDENT_AMBULATORY_CARE_PROVIDER_SITE_OTHER): Payer: 59 | Admitting: Sports Medicine

## 2014-03-26 VITALS — BP 149/77 | HR 80 | Ht 72.0 in | Wt 281.0 lb

## 2014-03-26 DIAGNOSIS — M4806 Spinal stenosis, lumbar region: Secondary | ICD-10-CM | POA: Diagnosis not present

## 2014-03-26 DIAGNOSIS — I10 Essential (primary) hypertension: Secondary | ICD-10-CM | POA: Insufficient documentation

## 2014-03-26 DIAGNOSIS — M48062 Spinal stenosis, lumbar region with neurogenic claudication: Secondary | ICD-10-CM

## 2014-03-26 MED ORDER — OXYCODONE-ACETAMINOPHEN 10-325 MG PO TABS: 1.0000 | ORAL_TABLET | Freq: Three times a day (TID) | ORAL | Status: DC | PRN

## 2014-03-26 MED ORDER — METOPROLOL SUCCINATE ER 25 MG PO TB24: 25.0000 mg | ORAL_TABLET | Freq: Every day | ORAL | Status: DC

## 2014-03-26 NOTE — Assessment & Plan Note (Signed)
Elevated. Continue lisinopril/hydrochlorothiazide, he did have an episode of pounding headache, he will check his blood pressure the next time this happens however I am going to add low-dose metoprolol, this will help with his blood pressure, episodes of palpitations, as well as preoperative mortality reduction.

## 2014-03-26 NOTE — Progress Notes (Signed)
  Subjective:    CC: Follow-up  HPI: Travis Huff and I talked at length today, he has severe L4-L5 and L5-S1 lumbar spinal stenosis, with progressive lower extremity neurologic deficit. He recently saw his neurosurgeon who is now planning for L4-S1 fusion and laminectomy. We did an sacroiliac joint injection at the last visit which gave him fantastic relief for about 3 days. He continues to have pain despite 10 mg of Vicodin.  Hypertension: Initially well controlled on lisinopril/hydrochlorothiazide, unfortunately had an episode of throbbing headache, sweating, though this may have represented a panic attack, he had no chest pain, and his blood pressure was normal when he took it at home. It is somewhat elevated today and he does have a mild headache. He denies any photophobia, phonophobia, or nausea.  Past medical history, Surgical history, Family history not pertinant except as noted below, Social history, Allergies, and medications have been entered into the medical record, reviewed, and no changes needed.   Review of Systems: No fevers, chills, night sweats, weight loss, chest pain, or shortness of breath.   Objective:    General: Well Developed, well nourished, and in no acute distress.  Neuro: Alert and oriented x3, extra-ocular muscles intact, sensation grossly intact.  HEENT: Normocephalic, atraumatic, pupils equal round reactive to light, neck supple, no masses, no lymphadenopathy, thyroid nonpalpable.  Skin: Warm and dry, no rashes. Cardiac: Regular rate and rhythm, no murmurs rubs or gallops, no lower extremity edema.  Respiratory: Clear to auscultation bilaterally. Not using accessory muscles, speaking in full sentences.  Impression and Recommendations:    I spent 40 minutes with this patient, greater than 50% was face-to-face time counseling regarding the below multiple diagnoses.

## 2014-03-26 NOTE — Assessment & Plan Note (Signed)
Surgery is scheduled for mid April. L4-S1 fusion. Oxycodone until then. He did have a temporary response after the sacroiliac joint injection.

## 2014-04-06 ENCOUNTER — Ambulatory Visit
Admission: RE | Admit: 2014-04-06 | Discharge: 2014-04-06 | Disposition: A | Discharge status: Home / Self Care | Payer: 59 | Source: Ambulatory Visit | Attending: Neurosurgery | Admitting: Neurosurgery

## 2014-04-06 ENCOUNTER — Other Ambulatory Visit: Payer: Self-pay | Admitting: Neurosurgery

## 2014-04-06 DIAGNOSIS — M419 Scoliosis, unspecified: Secondary | ICD-10-CM

## 2014-04-07 ENCOUNTER — Other Ambulatory Visit: Payer: Self-pay | Admitting: Neurosurgery

## 2014-04-08 ENCOUNTER — Telehealth: Payer: Self-pay | Admitting: Vascular Surgery

## 2014-04-08 NOTE — Telephone Encounter (Signed)
-----   Message from Denman George, RN sent at 04/07/2014  9:48 AM EDT ----- Regarding: needs appt. with [facility] This pt. Needs a new pt. Consult appt. with [facility] prior to an ALIF on 04/23/14. (Dr. Scot Dock will be assistig Dr. [name])  Please remind pt. to bring copy of his L-S spine films to appt. with him.

## 2014-04-08 NOTE — Telephone Encounter (Signed)
Spoke with pts wife to schedule, she understands that we will be working him in to the schedule, dpm

## 2014-04-09 ENCOUNTER — Encounter: Payer: Self-pay | Admitting: Sports Medicine

## 2014-04-09 ENCOUNTER — Ambulatory Visit (INDEPENDENT_AMBULATORY_CARE_PROVIDER_SITE_OTHER): Payer: 59

## 2014-04-09 ENCOUNTER — Ambulatory Visit (INDEPENDENT_AMBULATORY_CARE_PROVIDER_SITE_OTHER): Payer: 59 | Admitting: Sports Medicine

## 2014-04-09 VITALS — BP 126/82 | HR 77 | Ht 72.0 in | Wt 283.0 lb

## 2014-04-09 DIAGNOSIS — M1611 Unilateral primary osteoarthritis, right hip: Secondary | ICD-10-CM

## 2014-04-09 DIAGNOSIS — I10 Essential (primary) hypertension: Secondary | ICD-10-CM

## 2014-04-09 NOTE — Assessment & Plan Note (Signed)
Repeat hip injection as above. I do think he will proceed to total hip arthroplasty in the future. He did have a fall, and I do see a hyperechoic structure on ultrasound so we will get a set of x-rays today. Return to see me on an as-needed basis.

## 2014-04-09 NOTE — Progress Notes (Signed)
  Subjective:    CC: follow-up  HPI: Hypertension: Doing extremely well now on current medications and the addition of a beta blocker at the last visit, no further pounding headaches.  Right hip osteoarthritis: Severe, end-stage, desires injection today, pain is severe, persistent and localized at the groin.  Lumbar spondylosis: Scheduled for a likely two-stage fusion procedure in a couple of weeks.  Past medical history, Surgical history, Family history not pertinant except as noted below, Social history, Allergies, and medications have been entered into the medical record, reviewed, and no changes needed.   Review of Systems: No fevers, chills, night sweats, weight loss, chest pain, or shortness of breath.   Objective:    General: Well Developed, well nourished, and in no acute distress.  Neuro: Alert and oriented x3, extra-ocular muscles intact, sensation grossly intact.  HEENT: Normocephalic, atraumatic, pupils equal round reactive to light, neck supple, no masses, no lymphadenopathy, thyroid nonpalpable.  Skin: Warm and dry, no rashes. Cardiac: Regular rate and rhythm, no murmurs rubs or gallops, no lower extremity edema.  Respiratory: Clear to auscultation bilaterally. Not using accessory muscles, speaking in full sentences.  Procedure: Real-time Ultrasound Guided Injection of right femoral acetabular joint Device: GE Logiq E  Verbal informed consent obtained.  Time-out conducted.  Noted no overlying erythema, induration, or other signs of local infection.  Skin prepped in a sterile fashion.  Local anesthesia: Topical Ethyl chloride.  With sterile technique and under real time ultrasound guidance:  Spinal needle advanced into the femoral head/neck junction, 2 mL Kenalog 40, 4 mL lidocaine injected easily. Completed without difficulty  Pain immediately resolved suggesting accurate placement of the medication.  Advised to call if fevers/chills, erythema, induration, drainage, or  persistent bleeding.  Images permanently stored and available for review in the ultrasound unit.  Impression: Technically successful ultrasound guided injection.  Impression and Recommendations:

## 2014-04-09 NOTE — Assessment & Plan Note (Signed)
Doing extremely well on current medications.

## 2014-04-14 ENCOUNTER — Encounter: Payer: Self-pay | Admitting: Vascular Surgery

## 2014-04-15 ENCOUNTER — Ambulatory Visit (INDEPENDENT_AMBULATORY_CARE_PROVIDER_SITE_OTHER): Payer: 59 | Admitting: Vascular Surgery

## 2014-04-15 ENCOUNTER — Inpatient Hospital Stay (HOSPITAL_COMMUNITY)
Admission: RE | Admit: 2014-04-15 | Discharge: 2014-04-15 | Disposition: A | Discharge status: Home / Self Care | Payer: 59 | Source: Ambulatory Visit

## 2014-04-15 ENCOUNTER — Encounter: Payer: Self-pay | Admitting: Vascular Surgery

## 2014-04-15 VITALS — BP 129/89 | HR 71 | Ht 72.0 in | Wt 278.0 lb

## 2014-04-15 DIAGNOSIS — M5137 Other intervertebral disc degeneration, lumbosacral region: Secondary | ICD-10-CM

## 2014-04-15 NOTE — Progress Notes (Signed)
[facility] [place]  Patient name: Travis Huff MRN: [id number] DOB: 09/27/1960 Sex: male  REASON FOR CONSULT: Evaluate for anterior retroperitoneal exposure of L5-S1  HPI: Travis Huff is a 54 y.o. male who is scheduled tentatively for anterior lumbar interbody fusion at L5-S1 by Dr. [name] on 04/23/2014. He has a long history of degenerative disc disease of the back. He has failed physical therapy, pain medication and injection therapy. We were asked to evaluate him for anterior retroperitoneal exposure of L5-S1 for ALIF at this level.   Of note, he was recently found to have significant right hip dysplasia and his surgery may be postponed if this becomes a more pressing issue.  His past medical history is also significant for previous ventral hernia repair and he has mesh used at the most recent repair by Dr. Hulen Skains.   Past Medical History  Diagnosis Date  . Hypertension   . Asthma   . Viral pericarditis    Family History  Problem Relation Age of Onset  . Adopted: Yes   SOCIAL HISTORY: History  Substance Use Topics  . Smoking status: Former Research scientist (life sciences)  . Smokeless tobacco: Not on file  . Alcohol Use: 8.4 oz/week    14 Cans of beer per week     Comment: 2 beers QD   Allergies  Allergen Reactions  . Dilaudid [Hydromorphone Hcl] Itching and Other (See Comments)    Couldn't sleep  . Morphine And Related Itching   Current Outpatient Prescriptions  Medication Sig Dispense Refill  . CIALIS 5 MG tablet TAKE 1 TABLET BY MOUTH DAILY *NEED APPOINTMENT* 30 tablet 0  . gabapentin (NEURONTIN) 300 MG capsule One tab PO qHS for a week, then BID for a week, then TID. May double weekly to a max of 3,600mg /day (Patient taking differently: Take 300 mg by mouth 3 (three) times daily. ) 180 capsule 3  . lisinopril-hydrochlorothiazide (PRINZIDE,ZESTORETIC) 20-25 MG per tablet TAKE 1 TABLET BY MOUTH DAILY. 30 tablet 5  . meloxicam (MOBIC) 15 MG tablet Take 15 mg by  mouth daily.    . metoprolol succinate (TOPROL-XL) 25 MG 24 hr tablet Take 1 tablet (25 mg total) by mouth daily. 30 tablet 3  . oxyCODONE-acetaminophen (PERCOCET) 10-325 MG per tablet Take 1 tablet by mouth every 8 (eight) hours as needed for pain. 60 tablet 0   No current facility-administered medications for this visit.   REVIEW OF SYSTEMS: Valu.Nieves ] denotes positive finding; [  ] denotes negative finding  CARDIOVASCULAR:  [ ]  chest pain   [ ]  chest pressure   [ ]  palpitations   [ ]  orthopnea   [ ]  dyspnea on exertion   [ ]  claudication   [ ]  rest pain   [ ]  DVT   [ ]  phlebitis PULMONARY:   [ ]  productive cough   [ ]  asthma   [ ]  wheezing NEUROLOGIC:   Valu.Nieves ] weakness  Valu.Nieves ] paresthesias  [ ]  aphasia  [ ]  amaurosis  [ ]  dizziness HEMATOLOGIC:   [ ]  bleeding problems   [ ]  clotting disorders MUSCULOSKELETAL:  [ ]  joint pain   [ ]  joint swelling [ ]  leg swelling GASTROINTESTINAL: [ ]   blood in stool  [ ]   hematemesis GENITOURINARY:  [ ]   dysuria  [ ]   hematuria PSYCHIATRIC:  [ ]  history of major depression INTEGUMENTARY:  [ ]  rashes  [ ]  ulcers CONSTITUTIONAL:  [ ]  fever   [ ]  chills  PHYSICAL EXAM: Filed Vitals:   04/15/14 1028  BP: 129/89  Pulse: 71  Height: 6' (1.829 m)  Weight: 278 lb (126.1 kg)  SpO2: 96%    Body mass index is 37.7 kg/(m^2).  GENERAL: The patient is a well-nourished male, in no acute distress. The vital signs are documented above. CARDIOVASCULAR: There is a regular rate and rhythm. I do not detect carotid bruits. He has palpable posterior tibial pulses. He has a palpable right dorsalis pedis pulse. I cannot palpate a left dorsalis pedis pulse. PULMONARY: There is good air exchange bilaterally without wheezing or rales. ABDOMEN: his abdomen is difficult to assess because of his obesity. He has a midline incision from previous ventral hernia repair. MUSCULOSKELETAL: There are no major deformities or cyanosis. NEUROLOGIC: No focal weakness or paresthesias are  detected. SKIN: There are no ulcers or rashes noted. PSYCHIATRIC: The patient has a normal affect.  DATA:  I have reviewed his MRI. Exposure of the L5-S1 disc should not require taking the iliolumbar vein and retracting the left iliac vein to the patient's right. I have also reviewed his plain films.  MEDICAL ISSUES:  He appears to be a reasonable candidate for anterior retroperitoneal exposure of L5-S1. This will be more challenging because of his obesity. His BMI is 38. In addition, I think we will be below the level of his mesh and I will discuss this with Dr. Hulen Skains. I have reviewed our role in exposure of the spine in order to allow anterior lumbar interbody fusion at the appropriate levels. We have discussed the potential complications of surgery, including but not limited to, arterial or venous injury, thrombosis, or bleeding. We have also discussed the potential risks of wound healing problems, the development of a hernia, nerve injury, leg swelling, or other unpredictable medical problems. I've also explained that for the L5-S1 level there is a small risk of retrograde ejaculation. All the patient's questions were answered and they are agreeable to proceed. Again, however, the surgery may be postponed if the right hip dysplasia takes a higher priority.   [facility] [place] [facility]: [phone]

## 2014-04-16 ENCOUNTER — Other Ambulatory Visit: Payer: Self-pay

## 2014-04-23 ENCOUNTER — Encounter (HOSPITAL_COMMUNITY): Admission: RE | Payer: Self-pay | Source: Ambulatory Visit

## 2014-04-23 ENCOUNTER — Inpatient Hospital Stay (HOSPITAL_COMMUNITY): Admission: RE | Admit: 2014-04-23 | Payer: 59 | Source: Ambulatory Visit | Admitting: Neurosurgery

## 2014-04-23 SURGERY — ANTERIOR LUMBAR FUSION 1 LEVEL
Anesthesia: General

## 2014-04-24 ENCOUNTER — Ambulatory Visit: Payer: Self-pay | Admitting: Orthopedic Surgery

## 2014-04-24 MED ORDER — ACETAMINOPHEN 10 MG/ML IV SOLN: 1000.0000 mg | Freq: Once | INTRAVENOUS | Status: AC

## 2014-04-24 MED ORDER — DEXAMETHASONE SODIUM PHOSPHATE 4 MG/ML IJ SOLN
4.0000 mg | Freq: Once | INTRAMUSCULAR | Status: AC
  Administered 2014-05-25: 4 mg via INTRAVENOUS

## 2014-04-24 MED ORDER — SODIUM CHLORIDE 0.9 % IV SOLN: 4.0000 mg | Freq: Once | INTRAVENOUS | Status: DC

## 2014-05-11 ENCOUNTER — Ambulatory Visit (INDEPENDENT_AMBULATORY_CARE_PROVIDER_SITE_OTHER): Payer: 59 | Admitting: Sports Medicine

## 2014-05-11 VITALS — BP 145/92 | HR 70 | Ht 71.0 in | Wt 273.0 lb

## 2014-05-11 DIAGNOSIS — Z01810 Encounter for preprocedural cardiovascular examination: Secondary | ICD-10-CM

## 2014-05-11 DIAGNOSIS — Z01818 Encounter for other preprocedural examination: Secondary | ICD-10-CM | POA: Insufficient documentation

## 2014-05-11 NOTE — Assessment & Plan Note (Addendum)
Yasin returns for operative clearance prior to his total hip arthroplasty. He is extremely low cardiac risk with a negative stress test in 2014, greater than 4 METs of exercise tolerance, he has a negative EKG, which is used more often as a baseline. It looks like he already has orders for CBC, metabolic panel, as well as coag factors. He is cleared for intermediate risk noncardiac surgery. He should take beta blockers perioperatively as usual. We did discuss decreasing to one half of a hydrocodone for the next week, then tramadol for the week afterwards, lowering his narcotic tolerance before hip surgery.

## 2014-05-11 NOTE — Progress Notes (Signed)
  Subjective:    CC: surgical clearance  HPI: Mare is scheduled in a couple of weeks for a total hip arthroplasty, he has at least 4 METS of exercise tolerance and is able to exert himself without any chest pain. He had a negative stress test in 2014. He has not had any trouble with bleeding on his prior left and right knee replacements. He is already scheduled for some preoperative blood work by anesthesiology before surgery.  Past medical history, Surgical history, Family history not pertinant except as noted below, Social history, Allergies, and medications have been entered into the medical record, reviewed, and no changes needed.   Review of Systems: No fevers, chills, night sweats, weight loss, chest pain, or shortness of breath.   Objective:    General: Well Developed, well nourished, and in no acute distress.  Neuro: Alert and oriented x3, extra-ocular muscles intact, sensation grossly intact.  HEENT: Normocephalic, atraumatic, pupils equal round reactive to light, neck supple, no masses, no lymphadenopathy, thyroid nonpalpable.  Skin: Warm and dry, no rashes. Cardiac: Regular rate and rhythm, no murmurs rubs or gallops, no lower extremity edema.  Respiratory: Clear to auscultation bilaterally. Not using accessory muscles, speaking in full sentences.  Twelve-lead EKG performed and will be scanned into the EMR.  Impression and Recommendations:

## 2014-05-14 ENCOUNTER — Encounter (HOSPITAL_COMMUNITY): Payer: Self-pay

## 2014-05-14 ENCOUNTER — Encounter (HOSPITAL_COMMUNITY)
Admission: RE | Admit: 2014-05-14 | Discharge: 2014-05-14 | Disposition: A | Discharge status: Home / Self Care | Payer: 59 | Source: Ambulatory Visit | Attending: Orthopedic Surgery | Admitting: Orthopedic Surgery

## 2014-05-14 DIAGNOSIS — M1611 Unilateral primary osteoarthritis, right hip: Secondary | ICD-10-CM | POA: Diagnosis not present

## 2014-05-14 DIAGNOSIS — Z01812 Encounter for preprocedural laboratory examination: Secondary | ICD-10-CM | POA: Insufficient documentation

## 2014-05-14 HISTORY — DX: Unspecified osteoarthritis, unspecified site: M19.90

## 2014-05-14 LAB — SURGICAL PCR SCREEN
MRSA, PCR: NEGATIVE
Staphylococcus aureus: NEGATIVE

## 2014-05-14 LAB — CBC
HCT: 45.9 % (ref 39.0–52.0)
Hemoglobin: 15.6 g/dL (ref 13.0–17.0)
MCH: 31.1 pg (ref 26.0–34.0)
MCHC: 34 g/dL (ref 30.0–36.0)
MCV: 91.4 fL (ref 78.0–100.0)
Platelets: 205 10*3/uL (ref 150–400)
RBC: 5.02 MIL/uL (ref 4.22–5.81)
RDW: 13.4 % (ref 11.5–15.5)
WBC: 8.2 10*3/uL (ref 4.0–10.5)

## 2014-05-14 LAB — URINALYSIS, ROUTINE W REFLEX MICROSCOPIC
Bilirubin Urine: NEGATIVE
Glucose, UA: NEGATIVE mg/dL
Hgb urine dipstick: NEGATIVE
Ketones, ur: NEGATIVE mg/dL
Leukocytes, UA: NEGATIVE
Nitrite: NEGATIVE
Protein, ur: NEGATIVE mg/dL
Specific Gravity, Urine: 1.025 (ref 1.005–1.030)
Urobilinogen, UA: 0.2 mg/dL (ref 0.0–1.0)
pH: 7 (ref 5.0–8.0)

## 2014-05-14 LAB — COMPREHENSIVE METABOLIC PANEL
ALT: 20 U/L (ref 17–63)
AST: 22 U/L (ref 15–41)
Albumin: 3.7 g/dL (ref 3.5–5.0)
Alkaline Phosphatase: 63 U/L (ref 38–126)
Anion gap: 12 (ref 5–15)
BUN: 17 mg/dL (ref 6–20)
CO2: 23 mmol/L (ref 22–32)
Calcium: 9.5 mg/dL (ref 8.9–10.3)
Chloride: 103 mmol/L (ref 101–111)
Creatinine, Ser: 1 mg/dL (ref 0.61–1.24)
GFR calc Af Amer: >60 mL/min (ref >60)
GFR calc non Af Amer: >60 mL/min (ref >60)
Glucose, Bld: 110 mg/dL — ABNORMAL HIGH (ref 65–99)
Potassium: 3.8 mmol/L (ref 3.5–5.1)
Sodium: 138 mmol/L (ref 135–145)
Total Bilirubin: 0.5 mg/dL (ref 0.3–1.2)
Total Protein: 6.8 g/dL (ref 6.5–8.1)

## 2014-05-14 LAB — TYPE AND SCREEN
ABO/RH(D): O NEG
Antibody Screen: NEGATIVE
Weak D: NEGATIVE

## 2014-05-14 LAB — PROTIME-INR
INR: 1.01 (ref 0.00–1.49)
Prothrombin Time: 13.4 seconds (ref 11.6–15.2)

## 2014-05-14 LAB — APTT: aPTT: 29 seconds (ref 24–37)

## 2014-05-14 LAB — ABO/RH: ABO/RH(D): O NEG

## 2014-05-14 NOTE — Pre-Procedure Instructions (Addendum)
Travis Huff  05/14/2014   Your procedure is scheduled on:  May 23   Report to [facility] Admitting [phone] AM.  Call this number if you have problems the morning of surgery: [phone]   Remember:   Do not eat food or drink liquids after midnight.   Take these medicines the morning of surgery with A SIP OF WATER: gabapentin (Neurontin), Ultram (Tramadol) if needed, Lopressor   Stop taking Aspirin, Ibuprofen Aleve, BC's, Goody's, Herbal medications, Fish Oil, and Mobic 7 days before your surgery   Do not wear jewelry, make-up or nail polish.  Do not wear lotions, powders, or perfumes. You may wear deodorant.  Do not shave 48 hours prior to surgery. Men may shave face and neck.  Do not bring valuables to the hospital.  [facility] is not responsible   for any belongings or valuables.               Contacts, dentures or bridgework may not be worn into surgery.  Leave suitcase in the car. After surgery it may be brought to your room.  For patients admitted to the hospital, discharge time is determined by your   treatment team.               Patients discharged the day of surgery will not be allowed to drive home.    Special Instructions: [facility] - Preparing for Surgery  Before surgery, you can play an important role.  Because skin is not sterile, your skin needs to be as free of germs as possible.  You can reduce the number of germs on you skin by washing with CHG (chlorahexidine gluconate) soap before surgery.  CHG is an antiseptic cleaner which kills germs and bonds with the skin to continue killing germs even after washing.  Please DO NOT use if you have an allergy to CHG or antibacterial soaps.  If your skin becomes reddened/irritated stop using the CHG and inform your nurse when you arrive at Short Stay.  Do not shave (including legs and underarms) for at least 48 hours prior to the first CHG shower.  You may shave your face.  Please follow these instructions  carefully:   1.  Shower with CHG Soap the night before surgery and the                                morning of Surgery.  2.  If you choose to wash your hair, wash your hair first as usual with your       normal shampoo.  3.  After you shampoo, rinse your hair and body thoroughly to remove the                      Shampoo.  4.  Use CHG as you would any other liquid soap.  You can apply chg directly       to the skin and wash gently with scrungie or a clean washcloth.  5.  Apply the CHG Soap to your body ONLY FROM THE NECK DOWN.        Do not use on open wounds or open sores.  Avoid contact with your eyes,       ears, mouth and genitals (private parts).  Wash genitals (private parts)       with your normal soap.  6.  Wash thoroughly, paying special attention to  the area where your surgery        will be performed.  7.  Thoroughly rinse your body with warm water from the neck down.  8.  DO NOT shower/wash with your normal soap after using and rinsing off       the CHG Soap.  9.  Pat yourself dry with a clean towel.            10.  Wear clean pajamas.            11.  Place clean sheets on your bed the night of your first shower and do not        sleep with pets.  Day of Surgery  Do not apply any lotions/deoderants the morning of surgery.  Please wear clean clothes to the hospital/surgery center.      Please read over the following fact sheets that you were given: Pain Booklet, Coughing and Deep Breathing, Blood Transfusion Information, MRSA Information and Surgical Site Infection Prevention

## 2014-05-14 NOTE — Progress Notes (Signed)
PCP is DR. Thekkekandam, medical clearance in chart. Cardiologist is Dr Jenkins [place] States he had an EKG last week request sent to Dr Dianah Field for report. Denies any chest pain. Stress test and echo noted in epic from 05-2012  Denies ever having a heart cath.

## 2014-05-14 NOTE — Progress Notes (Signed)
   05/14/14 1133  OBSTRUCTIVE SLEEP APNEA  Have you ever been diagnosed with sleep apnea through a sleep study? No  Do you snore loudly (loud enough to be heard through closed doors)?  0  Do you often feel tired, fatigued, or sleepy during the daytime? 0  Has anyone observed you stop breathing during your sleep? 0  Do you have, or are you being treated for high blood pressure? 1  BMI more than 35 kg/m2? 1  Age over 53 years old? 1  Neck circumference greater than 40 cm/16 inches? 1  Gender: 1

## 2014-05-15 NOTE — Addendum Note (Signed)
Addended by: Narda Rutherford on: 05/15/2014 11:27 AM   Modules accepted: Orders

## 2014-05-22 MED ORDER — TRANEXAMIC ACID 1000 MG/10ML IV SOLN
1000.0000 mg | INTRAVENOUS | Status: AC
  Administered 2014-05-25: 1000 mg via INTRAVENOUS
  Filled 2014-05-22: qty 10

## 2014-05-24 MED ORDER — DEXTROSE 5 % IV SOLN
3.0000 g | INTRAVENOUS | Status: AC
  Administered 2014-05-25: 3 g via INTRAVENOUS
  Filled 2014-05-24: qty 3000

## 2014-05-25 ENCOUNTER — Encounter (HOSPITAL_COMMUNITY): Payer: Self-pay | Admitting: *Deleted

## 2014-05-25 ENCOUNTER — Inpatient Hospital Stay (HOSPITAL_COMMUNITY)
Admission: RE | Admit: 2014-05-25 | Discharge: 2014-05-26 | DRG: 470 | Disposition: A | Discharge status: Home Health | Payer: 59 | Source: Ambulatory Visit | Attending: Orthopedic Surgery | Admitting: Orthopedic Surgery

## 2014-05-25 ENCOUNTER — Inpatient Hospital Stay (HOSPITAL_COMMUNITY): Payer: 59

## 2014-05-25 ENCOUNTER — Encounter (HOSPITAL_COMMUNITY)
Admission: RE | Disposition: A | Discharge status: Home Health | Payer: Self-pay | Source: Ambulatory Visit | Attending: Orthopedic Surgery

## 2014-05-25 ENCOUNTER — Inpatient Hospital Stay (HOSPITAL_COMMUNITY): Payer: 59 | Admitting: Certified Registered Nurse Anesthetist

## 2014-05-25 ENCOUNTER — Inpatient Hospital Stay (HOSPITAL_COMMUNITY): Payer: 59 | Admitting: Vascular Surgery

## 2014-05-25 DIAGNOSIS — J45909 Unspecified asthma, uncomplicated: Secondary | ICD-10-CM | POA: Diagnosis present

## 2014-05-25 DIAGNOSIS — M1612 Unilateral primary osteoarthritis, left hip: Secondary | ICD-10-CM | POA: Diagnosis present

## 2014-05-25 DIAGNOSIS — Z7982 Long term (current) use of aspirin: Secondary | ICD-10-CM | POA: Diagnosis not present

## 2014-05-25 DIAGNOSIS — N529 Male erectile dysfunction, unspecified: Secondary | ICD-10-CM | POA: Diagnosis present

## 2014-05-25 DIAGNOSIS — M87351 Other secondary osteonecrosis, right femur: Secondary | ICD-10-CM | POA: Diagnosis present

## 2014-05-25 DIAGNOSIS — M1611 Unilateral primary osteoarthritis, right hip: Secondary | ICD-10-CM | POA: Diagnosis present

## 2014-05-25 DIAGNOSIS — Z79899 Other long term (current) drug therapy: Secondary | ICD-10-CM

## 2014-05-25 DIAGNOSIS — Z885 Allergy status to narcotic agent status: Secondary | ICD-10-CM

## 2014-05-25 DIAGNOSIS — Z96653 Presence of artificial knee joint, bilateral: Secondary | ICD-10-CM | POA: Diagnosis present

## 2014-05-25 DIAGNOSIS — M25551 Pain in right hip: Secondary | ICD-10-CM | POA: Diagnosis present

## 2014-05-25 DIAGNOSIS — Z87891 Personal history of nicotine dependence: Secondary | ICD-10-CM | POA: Diagnosis not present

## 2014-05-25 DIAGNOSIS — I10 Essential (primary) hypertension: Secondary | ICD-10-CM | POA: Diagnosis present

## 2014-05-25 DIAGNOSIS — Z6838 Body mass index (BMI) 38.0-38.9, adult: Secondary | ICD-10-CM

## 2014-05-25 DIAGNOSIS — Z96641 Presence of right artificial hip joint: Secondary | ICD-10-CM | POA: Diagnosis present

## 2014-05-25 DIAGNOSIS — Z419 Encounter for procedure for purposes other than remedying health state, unspecified: Secondary | ICD-10-CM

## 2014-05-25 DIAGNOSIS — Z09 Encounter for follow-up examination after completed treatment for conditions other than malignant neoplasm: Secondary | ICD-10-CM

## 2014-05-25 HISTORY — PX: TOTAL HIP ARTHROPLASTY: SHX124

## 2014-05-25 LAB — POCT I-STAT 4, (NA,K, GLUC, HGB,HCT)
Glucose, Bld: 146 mg/dL — ABNORMAL HIGH (ref 65–99)
HCT: 42 % (ref 39.0–52.0)
Hemoglobin: 14.3 g/dL (ref 13.0–17.0)
Potassium: 4.3 mmol/L (ref 3.5–5.1)
Sodium: 133 mmol/L — ABNORMAL LOW (ref 135–145)

## 2014-05-25 SURGERY — ARTHROPLASTY, HIP, TOTAL, ANTERIOR APPROACH
Anesthesia: Choice | Laterality: Right

## 2014-05-25 MED ORDER — HYDROCHLOROTHIAZIDE 25 MG PO TABS
25.0000 mg | ORAL_TABLET | Freq: Every day | ORAL | Status: DC
  Administered 2014-05-26: 25 mg via ORAL
  Filled 2014-05-25: qty 1

## 2014-05-25 MED ORDER — PHENOL 1.4 % MT LIQD: 1.0000 | OROMUCOSAL | Status: DC | PRN

## 2014-05-25 MED ORDER — ACETAMINOPHEN 650 MG RE SUPP: 650.0000 mg | Freq: Four times a day (QID) | RECTAL | Status: DC | PRN

## 2014-05-25 MED ORDER — DEXAMETHASONE SODIUM PHOSPHATE 10 MG/ML IJ SOLN
10.0000 mg | Freq: Once | INTRAMUSCULAR | Status: AC
Start: 2014-05-26 — End: 2014-05-26
  Administered 2014-05-26: 10 mg via INTRAVENOUS
  Filled 2014-05-25: qty 1

## 2014-05-25 MED ORDER — STERILE WATER FOR INJECTION IJ SOLN
INTRAMUSCULAR | Status: AC
  Filled 2014-05-25: qty 10

## 2014-05-25 MED ORDER — DOCUSATE SODIUM 100 MG PO CAPS
100.0000 mg | ORAL_CAPSULE | Freq: Two times a day (BID) | ORAL | Status: DC
  Administered 2014-05-25 – 2014-05-26 (×2): 100 mg via ORAL
  Filled 2014-05-25 (×2): qty 1

## 2014-05-25 MED ORDER — SODIUM CHLORIDE 0.9 % IV SOLN: INTRAVENOUS | Status: DC | PRN

## 2014-05-25 MED ORDER — LIDOCAINE HCL (CARDIAC) 20 MG/ML IV SOLN
INTRAVENOUS | Status: AC
  Filled 2014-05-25: qty 5

## 2014-05-25 MED ORDER — FENTANYL CITRATE (PF) 250 MCG/5ML IJ SOLN
INTRAMUSCULAR | Status: AC
  Filled 2014-05-25: qty 5

## 2014-05-25 MED ORDER — METOPROLOL TARTRATE 1 MG/ML IV SOLN
INTRAVENOUS | Status: AC
  Filled 2014-05-25: qty 5

## 2014-05-25 MED ORDER — GLYCOPYRROLATE 0.2 MG/ML IJ SOLN
INTRAMUSCULAR | Status: DC | PRN
  Administered 2014-05-25: 0.6 mg via INTRAVENOUS

## 2014-05-25 MED ORDER — EPHEDRINE SULFATE 50 MG/ML IJ SOLN
INTRAMUSCULAR | Status: AC
  Filled 2014-05-25: qty 1

## 2014-05-25 MED ORDER — SENNA 8.6 MG PO TABS
2.0000 | ORAL_TABLET | Freq: Every day | ORAL | Status: DC
  Administered 2014-05-25: 17.2 mg via ORAL
  Filled 2014-05-25: qty 2

## 2014-05-25 MED ORDER — MORPHINE SULFATE 2 MG/ML IJ SOLN: 2.0000 mg | INTRAMUSCULAR | Status: DC | PRN

## 2014-05-25 MED ORDER — GLYCOPYRROLATE 0.2 MG/ML IJ SOLN
INTRAMUSCULAR | Status: AC
  Filled 2014-05-25: qty 3

## 2014-05-25 MED ORDER — PHENYLEPHRINE 40 MCG/ML (10ML) SYRINGE FOR IV PUSH (FOR BLOOD PRESSURE SUPPORT)
PREFILLED_SYRINGE | INTRAVENOUS | Status: AC
  Filled 2014-05-25: qty 10

## 2014-05-25 MED ORDER — MIDAZOLAM HCL 2 MG/2ML IJ SOLN
INTRAMUSCULAR | Status: AC
  Filled 2014-05-25: qty 2

## 2014-05-25 MED ORDER — 0.9 % SODIUM CHLORIDE (POUR BTL) OPTIME
TOPICAL | Status: DC | PRN
  Administered 2014-05-25: 1000 mL

## 2014-05-25 MED ORDER — ONDANSETRON HCL 4 MG PO TABS: 4.0000 mg | ORAL_TABLET | Freq: Four times a day (QID) | ORAL | Status: DC | PRN

## 2014-05-25 MED ORDER — METHOCARBAMOL 500 MG PO TABS
500.0000 mg | ORAL_TABLET | Freq: Four times a day (QID) | ORAL | Status: DC | PRN
  Administered 2014-05-25 – 2014-05-26 (×2): 500 mg via ORAL
  Filled 2014-05-25 (×2): qty 1

## 2014-05-25 MED ORDER — METOPROLOL TARTRATE 1 MG/ML IV SOLN
INTRAVENOUS | Status: DC | PRN
  Administered 2014-05-25 (×2): 2.5 mg via INTRAVENOUS

## 2014-05-25 MED ORDER — LISINOPRIL-HYDROCHLOROTHIAZIDE 20-25 MG PO TABS: 1.0000 | ORAL_TABLET | Freq: Every day | ORAL | Status: DC

## 2014-05-25 MED ORDER — SODIUM CHLORIDE 0.9 % IV SOLN: INTRAVENOUS | Status: DC

## 2014-05-25 MED ORDER — DEXTROSE 5 % IV SOLN: 500.0000 mg | Freq: Four times a day (QID) | INTRAVENOUS | Status: DC | PRN

## 2014-05-25 MED ORDER — POVIDONE-IODINE 7.5 % EX SOLN
CUTANEOUS | Status: DC | PRN
  Administered 2014-05-25: 17.5 via TOPICAL

## 2014-05-25 MED ORDER — METOCLOPRAMIDE HCL 5 MG/ML IJ SOLN
5.0000 mg | Freq: Three times a day (TID) | INTRAMUSCULAR | Status: DC | PRN
Start: 2014-05-25 — End: 2014-05-26

## 2014-05-25 MED ORDER — ONDANSETRON HCL 4 MG/2ML IJ SOLN
INTRAMUSCULAR | Status: AC
  Filled 2014-05-25: qty 2

## 2014-05-25 MED ORDER — ROCURONIUM BROMIDE 100 MG/10ML IV SOLN
INTRAVENOUS | Status: DC | PRN
  Administered 2014-05-25: 50 mg via INTRAVENOUS
  Administered 2014-05-25: 10 mg via INTRAVENOUS
  Administered 2014-05-25: 20 mg via INTRAVENOUS

## 2014-05-25 MED ORDER — METOCLOPRAMIDE HCL 5 MG PO TABS
5.0000 mg | ORAL_TABLET | Freq: Three times a day (TID) | ORAL | Status: DC | PRN
Start: 2014-05-25 — End: 2014-05-26

## 2014-05-25 MED ORDER — LACTATED RINGERS IV SOLN
INTRAVENOUS | Status: DC
  Administered 2014-05-25 (×3): via INTRAVENOUS

## 2014-05-25 MED ORDER — MENTHOL 3 MG MT LOZG: 1.0000 | LOZENGE | OROMUCOSAL | Status: DC | PRN

## 2014-05-25 MED ORDER — NEOSTIGMINE METHYLSULFATE 10 MG/10ML IV SOLN
INTRAVENOUS | Status: AC
  Filled 2014-05-25: qty 1

## 2014-05-25 MED ORDER — KETOROLAC TROMETHAMINE 30 MG/ML IJ SOLN
INTRAMUSCULAR | Status: AC
  Filled 2014-05-25: qty 1

## 2014-05-25 MED ORDER — SODIUM CHLORIDE 0.9 % IV SOLN
INTRAVENOUS | Status: DC
  Administered 2014-05-25: 22:00:00 via INTRAVENOUS

## 2014-05-25 MED ORDER — ALBUMIN HUMAN 5 % IV SOLN
INTRAVENOUS | Status: DC | PRN
  Administered 2014-05-25: 15:00:00 via INTRAVENOUS

## 2014-05-25 MED ORDER — KETOROLAC TROMETHAMINE 30 MG/ML IJ SOLN
INTRAMUSCULAR | Status: DC | PRN
  Administered 2014-05-25: 30 mg via INTRAVENOUS

## 2014-05-25 MED ORDER — HYDROCODONE-ACETAMINOPHEN 5-325 MG PO TABS
1.0000 | ORAL_TABLET | ORAL | Status: DC | PRN
  Administered 2014-05-25 – 2014-05-26 (×2): 2 via ORAL
  Filled 2014-05-25 (×2): qty 2

## 2014-05-25 MED ORDER — CHLORHEXIDINE GLUCONATE 4 % EX LIQD: 60.0000 mL | Freq: Once | CUTANEOUS | Status: DC

## 2014-05-25 MED ORDER — ONDANSETRON HCL 4 MG/2ML IJ SOLN
INTRAMUSCULAR | Status: DC | PRN
  Administered 2014-05-25: 4 mg via INTRAVENOUS

## 2014-05-25 MED ORDER — ROCURONIUM BROMIDE 50 MG/5ML IV SOLN
INTRAVENOUS | Status: AC
  Filled 2014-05-25: qty 1

## 2014-05-25 MED ORDER — METOPROLOL SUCCINATE ER 25 MG PO TB24
25.0000 mg | ORAL_TABLET | Freq: Every day | ORAL | Status: DC
  Administered 2014-05-26: 25 mg via ORAL
  Filled 2014-05-25: qty 1

## 2014-05-25 MED ORDER — FENTANYL CITRATE (PF) 100 MCG/2ML IJ SOLN
INTRAMUSCULAR | Status: DC | PRN
  Administered 2014-05-25 (×2): 50 ug via INTRAVENOUS
  Administered 2014-05-25: 100 ug via INTRAVENOUS
  Administered 2014-05-25: 50 ug via INTRAVENOUS
  Administered 2014-05-25: 100 ug via INTRAVENOUS
  Administered 2014-05-25 (×5): 50 ug via INTRAVENOUS

## 2014-05-25 MED ORDER — MIDAZOLAM HCL 5 MG/5ML IJ SOLN
INTRAMUSCULAR | Status: DC | PRN
  Administered 2014-05-25: 2 mg via INTRAVENOUS

## 2014-05-25 MED ORDER — ACETAMINOPHEN 10 MG/ML IV SOLN
INTRAVENOUS | Status: DC | PRN
  Administered 2014-05-25: 1000 mg via INTRAVENOUS

## 2014-05-25 MED ORDER — KETOROLAC TROMETHAMINE 15 MG/ML IJ SOLN
15.0000 mg | Freq: Four times a day (QID) | INTRAMUSCULAR | Status: AC
  Administered 2014-05-25 – 2014-05-26 (×4): 15 mg via INTRAVENOUS
  Filled 2014-05-25 (×4): qty 1

## 2014-05-25 MED ORDER — LISINOPRIL 20 MG PO TABS
20.0000 mg | ORAL_TABLET | Freq: Every day | ORAL | Status: DC
  Administered 2014-05-26: 20 mg via ORAL
  Filled 2014-05-25: qty 1

## 2014-05-25 MED ORDER — CEFAZOLIN SODIUM-DEXTROSE 2-3 GM-% IV SOLR
2.0000 g | Freq: Four times a day (QID) | INTRAVENOUS | Status: AC
  Administered 2014-05-25 – 2014-05-26 (×2): 2 g via INTRAVENOUS
  Filled 2014-05-25 (×2): qty 50

## 2014-05-25 MED ORDER — PROPOFOL 10 MG/ML IV BOLUS
INTRAVENOUS | Status: DC | PRN
  Administered 2014-05-25: 30 mg via INTRAVENOUS
  Administered 2014-05-25: 250 mg via INTRAVENOUS

## 2014-05-25 MED ORDER — ACETAMINOPHEN 10 MG/ML IV SOLN
INTRAVENOUS | Status: AC
  Filled 2014-05-25: qty 100

## 2014-05-25 MED ORDER — NEOSTIGMINE METHYLSULFATE 10 MG/10ML IV SOLN
INTRAVENOUS | Status: DC | PRN
  Administered 2014-05-25: 4 mg via INTRAVENOUS

## 2014-05-25 MED ORDER — SODIUM CHLORIDE 0.9 % IR SOLN
Status: DC | PRN
  Administered 2014-05-25: 3000 mL

## 2014-05-25 MED ORDER — ASPIRIN EC 325 MG PO TBEC
325.0000 mg | DELAYED_RELEASE_TABLET | Freq: Two times a day (BID) | ORAL | Status: DC
  Administered 2014-05-26: 325 mg via ORAL
  Filled 2014-05-25: qty 1

## 2014-05-25 MED ORDER — ARTIFICIAL TEARS OP OINT
TOPICAL_OINTMENT | OPHTHALMIC | Status: AC
  Filled 2014-05-25: qty 3.5

## 2014-05-25 MED ORDER — LIDOCAINE HCL (CARDIAC) 20 MG/ML IV SOLN
INTRAVENOUS | Status: DC | PRN
  Administered 2014-05-25: 100 mg via INTRAVENOUS
  Administered 2014-05-25: 60 mg via INTRATRACHEAL

## 2014-05-25 MED ORDER — ACETAMINOPHEN 325 MG PO TABS: 650.0000 mg | ORAL_TABLET | Freq: Four times a day (QID) | ORAL | Status: DC | PRN

## 2014-05-25 MED ORDER — ONDANSETRON HCL 4 MG/2ML IJ SOLN: 4.0000 mg | Freq: Four times a day (QID) | INTRAMUSCULAR | Status: DC | PRN

## 2014-05-25 MED ORDER — BUPIVACAINE-EPINEPHRINE (PF) 0.5% -1:200000 IJ SOLN
INTRAMUSCULAR | Status: DC | PRN
  Administered 2014-05-25: 30 mL via PERINEURAL

## 2014-05-25 MED ORDER — SODIUM CHLORIDE 0.9 % IJ SOLN
INTRAMUSCULAR | Status: DC | PRN
  Administered 2014-05-25: 30 mL via INTRAVENOUS

## 2014-05-25 MED ORDER — GABAPENTIN 300 MG PO CAPS
300.0000 mg | ORAL_CAPSULE | Freq: Three times a day (TID) | ORAL | Status: DC
  Administered 2014-05-25 – 2014-05-26 (×3): 300 mg via ORAL
  Filled 2014-05-25 (×3): qty 1

## 2014-05-25 SURGICAL SUPPLY — 62 items
BLADE SAGITTAL 25.0X1.19X90 (BLADE) ×2 IMPLANT
BLADE SAGITTAL 25.0X1.19X90MM (BLADE) ×1
BLADE SAW SGTL 18X1.27X75 (BLADE) ×2 IMPLANT
BLADE SAW SGTL 18X1.27X75MM (BLADE) ×1
BLADE SURG ROTATE 9660 (MISCELLANEOUS) IMPLANT
CAPT HIP TOTAL 2 ×3 IMPLANT
CHLORAPREP W/TINT 26ML (MISCELLANEOUS) ×3 IMPLANT
COVER SURGICAL LIGHT HANDLE (MISCELLANEOUS) ×3 IMPLANT
DERMABOND ADVANCED (GAUZE/BANDAGES/DRESSINGS) ×2
DERMABOND ADVANCED .7 DNX12 (GAUZE/BANDAGES/DRESSINGS) ×1 IMPLANT
DRAPE C-ARM 42X72 X-RAY (DRAPES) ×3 IMPLANT
DRAPE IMP U-DRAPE 54X76 (DRAPES) ×6 IMPLANT
DRAPE STERI IOBAN 125X83 (DRAPES) ×3 IMPLANT
DRAPE U-SHAPE 47X51 STRL (DRAPES) ×9 IMPLANT
DRSG AQUACEL AG ADV 3.5X10 (GAUZE/BANDAGES/DRESSINGS) ×3 IMPLANT
ELECT BLADE 4.0 EZ CLEAN MEGAD (MISCELLANEOUS) ×3
ELECT REM PT RETURN 9FT ADLT (ELECTROSURGICAL) ×3
ELECTRODE BLDE 4.0 EZ CLN MEGD (MISCELLANEOUS) ×1 IMPLANT
ELECTRODE REM PT RTRN 9FT ADLT (ELECTROSURGICAL) ×1 IMPLANT
EVACUATOR 1/8 PVC DRAIN (DRAIN) IMPLANT
GLOVE BIO SURGEON STRL SZ 6.5 (GLOVE) ×2 IMPLANT
GLOVE BIO SURGEON STRL SZ8.5 (GLOVE) ×9 IMPLANT
GLOVE BIO SURGEONS STRL SZ 6.5 (GLOVE) ×1
GLOVE BIOGEL PI IND STRL 6.5 (GLOVE) ×2 IMPLANT
GLOVE BIOGEL PI IND STRL 7.5 (GLOVE) ×1 IMPLANT
GLOVE BIOGEL PI IND STRL 8.5 (GLOVE) ×1 IMPLANT
GLOVE BIOGEL PI INDICATOR 6.5 (GLOVE) ×4
GLOVE BIOGEL PI INDICATOR 7.5 (GLOVE) ×2
GLOVE BIOGEL PI INDICATOR 8.5 (GLOVE) ×2
GLOVE ECLIPSE 7.5 STRL STRAW (GLOVE) ×3 IMPLANT
GLOVE SURG SS PI 6.5 STRL IVOR (GLOVE) ×3 IMPLANT
GLOVE SURG SS PI 8.0 STRL IVOR (GLOVE) ×12 IMPLANT
GOWN STRL REUS W/ TWL LRG LVL3 (GOWN DISPOSABLE) ×4 IMPLANT
GOWN STRL REUS W/TWL 2XL LVL3 (GOWN DISPOSABLE) ×6 IMPLANT
GOWN STRL REUS W/TWL LRG LVL3 (GOWN DISPOSABLE) ×8
HANDPIECE INTERPULSE COAX TIP (DISPOSABLE) ×2
HOOD PEEL AWAY FACE SHEILD DIS (HOOD) ×12 IMPLANT
KIT BASIN OR (CUSTOM PROCEDURE TRAY) ×3 IMPLANT
KIT ROOM TURNOVER OR (KITS) ×3 IMPLANT
MANIFOLD NEPTUNE II (INSTRUMENTS) ×3 IMPLANT
MARKER SKIN DUAL TIP RULER LAB (MISCELLANEOUS) ×6 IMPLANT
NEEDLE SPNL 18GX3.5 QUINCKE PK (NEEDLE) ×3 IMPLANT
NS IRRIG 1000ML POUR BTL (IV SOLUTION) ×3 IMPLANT
PACK TOTAL JOINT (CUSTOM PROCEDURE TRAY) ×3 IMPLANT
PACK UNIVERSAL I (CUSTOM PROCEDURE TRAY) ×3 IMPLANT
PAD ARMBOARD 7.5X6 YLW CONV (MISCELLANEOUS) ×6 IMPLANT
SEALER BIPOLAR AQUA 6.0 (INSTRUMENTS) ×3 IMPLANT
SET HNDPC FAN SPRY TIP SCT (DISPOSABLE) ×1 IMPLANT
SUCTION FRAZIER TIP 10 FR DISP (SUCTIONS) ×3 IMPLANT
SUT ETHIBOND NAB CT1 #1 30IN (SUTURE) ×6 IMPLANT
SUT MNCRL AB 3-0 PS2 18 (SUTURE) ×3 IMPLANT
SUT MON AB 2-0 CT1 36 (SUTURE) ×3 IMPLANT
SUT VIC AB 1 CT1 27 (SUTURE) ×2
SUT VIC AB 1 CT1 27XBRD ANBCTR (SUTURE) ×1 IMPLANT
SUT VIC AB 2-0 CT1 27 (SUTURE) ×2
SUT VIC AB 2-0 CT1 TAPERPNT 27 (SUTURE) ×1 IMPLANT
SUT VLOC 180 0 24IN GS25 (SUTURE) ×3 IMPLANT
SYR 50ML LL SCALE MARK (SYRINGE) ×3 IMPLANT
TOWEL OR 17X24 6PK STRL BLUE (TOWEL DISPOSABLE) ×3 IMPLANT
TOWEL OR 17X26 10 PK STRL BLUE (TOWEL DISPOSABLE) ×3 IMPLANT
TRAY FOLEY CATH 16FR SILVER (SET/KITS/TRAYS/PACK) ×3 IMPLANT
WATER STERILE IRR 1000ML POUR (IV SOLUTION) IMPLANT

## 2014-05-25 NOTE — Discharge Instructions (Signed)
°Dr. Anett Ranker °Joint Replacement Specialist °[facility] °[address] °[place], [place] [address] °[phone] ° ° °TOTAL HIP REPLACEMENT POSTOPERATIVE DIRECTIONS ° ° ° °Hip Rehabilitation, Guidelines Following Surgery  ° °WEIGHT BEARING °Weight bearing as tolerated with assist device (walker, cane, etc) as directed, use it as long as suggested by your surgeon or therapist, typically at least 4-6 weeks. ° °The results of a hip operation are greatly improved after range of motion and muscle strengthening exercises. Follow all safety measures which are given to protect your hip. If any of these exercises cause increased pain or swelling in your joint, decrease the amount until you are comfortable again. Then slowly increase the exercises. Call your caregiver if you have problems or questions.  ° °HOME CARE INSTRUCTIONS  °Most of the following instructions are designed to prevent the dislocation of your new hip.  °Remove items at home which could result in a fall. This includes throw rugs or furniture in walking pathways.  °Continue medications as instructed at time of discharge. °· You may have some home medications which will be placed on hold until you complete the course of blood thinner medication. °· You may start showering once you are discharged home. Do not remove your dressing. °Do not put on socks or shoes without following the instructions of your caregivers.   °Sit on chairs with arms. Use the chair arms to help push yourself up when arising.  °Arrange for the use of a toilet seat elevator so you are not sitting low.  °· Walk with walker as instructed.  °You may resume a sexual relationship in one month or when given the OK by your caregiver.  °Use walker as long as suggested by your caregivers.  °You may put full weight on your legs and walk as much as is comfortable. °Avoid periods of inactivity such as sitting longer than an hour when not asleep. This helps prevent  blood clots.  °You may return to work once you are cleared by your surgeon.  °Do not drive a car for 6 weeks or until released by your surgeon.  °Do not drive while taking narcotics.  °Wear elastic stockings for two weeks following surgery during the day but you may remove then at night.  °Make sure you keep all of your appointments after your operation with all of your doctors and caregivers. You should call the office at the above phone number and make an appointment for approximately two weeks after the date of your surgery. °Please pick up a stool softener and laxative for home use as long as you are requiring pain medications. °· ICE to the affected hip every three hours for 30 minutes at a time and then as needed for pain and swelling. Continue to use ice on the hip for pain and swelling from surgery. You may notice swelling that will progress down to the foot and ankle.  This is normal after surgery.  Elevate the leg when you are not up walking on it.   °It is important for you to complete the blood thinner medication as prescribed by your doctor. °· Continue to use the breathing machine which will help keep your temperature down.  It is common for your temperature to cycle up and down following surgery, especially at night when you are not up moving around and exerting yourself.  The breathing machine keeps your lungs expanded and your temperature down. ° °RANGE OF MOTION AND STRENGTHENING EXERCISES  °These exercises are   designed to help you keep full movement of your hip joint. Follow your caregiver's or physical therapist's instructions. Perform all exercises about fifteen times, three times per day or as directed. Exercise both hips, even if you have had only one joint replacement. These exercises can be done on a training (exercise) mat, on the floor, on a table or on a bed. Use whatever works the best and is most comfortable for you. Use music or television while you are exercising so that the exercises  are a pleasant break in your day. This will make your life better with the exercises acting as a break in routine you can look forward to.  °Lying on your back, slowly slide your foot toward your buttocks, raising your knee up off the floor. Then slowly slide your foot back down until your leg is straight again.  °Lying on your back spread your legs as far apart as you can without causing discomfort.  °Lying on your side, raise your upper leg and foot straight up from the floor as far as is comfortable. Slowly lower the leg and repeat.  °Lying on your back, tighten up the muscle in the front of your thigh (quadriceps muscles). You can do this by keeping your leg straight and trying to raise your heel off the floor. This helps strengthen the largest muscle supporting your knee.  °Lying on your back, tighten up the muscles of your buttocks both with the legs straight and with the knee bent at a comfortable angle while keeping your heel on the floor.  ° °SKILLED REHAB INSTRUCTIONS: °If the patient is transferred to a skilled rehab facility following release from the hospital, a list of the current medications will be sent to the facility for the patient to continue.  When discharged from the skilled rehab facility, please have the facility set up the patient's Home Health Physical Therapy prior to being released. Also, the skilled facility will be responsible for providing the patient with their medications at time of release from the facility to include their pain medication and their blood thinner medication. If the patient is still at the rehab facility at time of the two week follow up appointment, the skilled rehab facility will also need to assist the patient in arranging follow up appointment in our office and any transportation needs. ° °MAKE SURE YOU:  °Understand these instructions.  °Will watch your condition.  °Will get help right away if you are not doing well or get worse. ° °Pick up stool softner and  laxative for home use following surgery while on pain medications. °Do not remove your dressing. °The dressing is waterproof--it is OK to take showers. °Continue to use ice for pain and swelling after surgery. °Do not use any lotions or creams on the incision until instructed by your surgeon. °Total Hip Protocol. ° ° °

## 2014-05-25 NOTE — Op Note (Signed)
OPERATIVE REPORT  SURGEON: Rod Can, MD   ASSISTANT: Ky Barban, RNFA.  PREOPERATIVE DIAGNOSIS: Right hip arthritis secondary to avascular necrosis.   POSTOPERATIVE DIAGNOSIS: Right hip arthritis secondary to avascular necrosis.   PROCEDURE: Right total hip arthroplasty, anterior approach.   IMPLANTS: DePuy Tri Lock stem, size 6, hi offset. DePuy Pinnacle Cup, size 60 mm. DePuy Altrx liner, size 36 by 60 mm. DePuy Biolox ceramic head ball, size 36 + 1.5 mm. 6.70mm cancellous screw x1.  ANESTHESIA:  General  ESTIMATED BLOOD LOSS: 1500 mL.  ANTIBIOTICS: 2g ancef.  DRAINS: None.  COMPLICATIONS: None.   CONDITION: PACU - hemodynamically stable.  BRIEF CLINICAL NOTE: Travis Huff is a 54 y.o. male with a long-standing history of Right hip arthritis secondary to avascular necrosis with severe collapse of the femoral head and acetabular bone loss. After failing conservative management, the patient was indicated for total hip arthroplasty. The risks, benefits, and alternatives to the procedure were explained, and the patient elected to proceed.  PROCEDURE IN DETAIL: Surgical site was marked by myself. Spinal anesthesia was obtained in the pre-op holding area. Once inside the operative room, a foley catheter was inserted. The patient was then positioned on the Hana table. All bony prominences were well padded. The hip was prepped and draped in the normal sterile surgical fashion. A time-out was called verifying side and site of surgery. The patient received IV antibiotics within 60 minutes of beginning the procedure.  The direct anterior approach to the hip was performed through the Hueter interval. Lateral femoral circumflex vessels were treated with the Auqumantys. The anterior capsule was exposed and an inverted T capsulotomy was made.The femoral neck cut was made to the level of the templated cut. A corkscrew was placed into the head and the head was removed.  The femoral head was found to be over 50% collapsed and have eburnated bone. The head was passed to the back table and was measured.  Acetabular exposure was achieved, and the socket was found to contain copious scar tissue. He had worn a false acetabulum superiorly. The scar was removed, and the pulvinar and labrum were excised. Sequental reaming of the acetabulum was then performed up to a size 59 mm reamer, keeping the hip center anatomic. A 60 mm cup was then opened and impacted into place at approximately 40 degrees of abduction and 20 degrees of anteversion. The cup had excellent stability, and I chose to place a single 6.49mm cancellous screw. The final polyethylene liner was impacted into place and acetabular osteophytes were removed.   I then gained femoral exposure taking care to protect the abductors and greater trochanter. This was performed using standard external rotation, extension, and adduction. The capsule was peeled off the inner aspect of the greater trochanter, taking care to preserve the short external rotators. A cookie cutter was used to enter the femoral canal, and then the femoral canal finder was placed. Sequential broaching was performed up to a size 6. Calcar planer was used on the femoral neck remnant. I paced a hi offset neck and a trial head ball. The hip was reduced. Leg lengths and offset were checked fluoroscopically. The hip was dislocated and trial components were removed. The final implants were placed, and the hip was reduced.  Fluoroscopy was used to confirm component position and leg lengths. At 90 degrees of external rotation and full extension, the hip was stable to an anterior directed force.  The wound was copiously irrigated with a dilute betadine  solution followed by normal saline. Marcaine solution was injected into the periarticular soft tissue. The wound was closed in layers using #1 Vicryl and V-Loc for the fascia, 2-0 Vicryl for the subcutaneous  fat, 2-0 Monocryl for the deep dermal layer, 3-0 running Monocryl subcuticular stitch, and Dermabond for the skin. Once the glue was fully dried, an Aquacell Ag dressing was applied. The patient was transported to the recovery room in stable condition. Sponge, needle, and instrument counts were correct at the end of the case x2. The patient tolerated the procedure well and there were no known complications.

## 2014-05-25 NOTE — Interval H&P Note (Signed)
History and Physical Interval Note:  05/25/2014 12:37 PM  Travis Huff  has presented today for surgery, with the diagnosis of RIGHT HIP OA  The various methods of treatment have been discussed with the patient and family. After consideration of risks, benefits and other options for treatment, the patient has consented to  Procedure(s): RIGHT TOTAL HIP ARTHROPLASTY ANTERIOR APPROACH (Right) as a surgical intervention .  The patient's history has been reviewed, patient examined, no change in status, stable for surgery.  I have reviewed the patient's chart and labs.  Questions were answered to the patient's satisfaction.     Sadae Arrazola, Horald Pollen

## 2014-05-25 NOTE — H&P (Signed)
TOTAL HIP ADMISSION H&P  Patient is admitted for right total hip arthroplasty.  Subjective:  Chief Complaint: right hip pain  HPI: Travis Huff, 54 y.o. male, has a history of pain and functional disability in the right hip(s) due to AVN and arthritis and patient has failed non-surgical conservative treatments for greater than 12 weeks to include NSAID's and/or analgesics, flexibility and strengthening excercises, use of assistive devices, weight reduction as appropriate and activity modification.  Onset of symptoms was gradual starting 1 years ago with gradually worsening course since that time.The patient noted no past surgery on the right hip(s).  Patient currently rates pain in the right hip at 10 out of 10 with activity. Patient has night pain, worsening of pain with activity and weight bearing, trendelenberg gait, pain that interfers with activities of daily living, pain with passive range of motion and crepitus. Patient has evidence of subchondral cysts, subchondral sclerosis, periarticular osteophytes, joint subluxation and joint space narrowing by imaging studies. This condition presents safety issues increasing the risk of falls. There is no current active infection.  Patient Active Problem List   Diagnosis Date Noted  . Preoperative clearance 05/11/2014  . Benign essential hypertension 03/26/2014  . Spinal stenosis, lumbar region, with neurogenic claudication 12/21/2013  . Lumbar spine scoliosis 12/21/2013  . Osteoarthritis of right hip 07/07/2013  . Lumbar spondylosis 06/19/2013  . Morbid obesity 08/01/2012  . Precordial pain 05/24/2012  . Hematochezia 04/01/2012  . S/P total knee arthroplasty 01/26/2012  . Erectile dysfunction 01/26/2012  . Preventive measure 01/26/2012   Past Medical History  Diagnosis Date  . Hypertension   . Asthma   . Viral pericarditis   . Arthritis     Past Surgical History  Procedure Laterality Date  . Knee surgery    . Tendon repair       right hand  . Cardiovascular stress test      Negative in May 2014  . Total knee arthroplasty Bilateral   . Joint replacement    . Hernia repair    . Colonoscopy      Prescriptions prior to admission  Medication Sig Dispense Refill Last Dose  . CIALIS 5 MG tablet TAKE 1 TABLET BY MOUTH DAILY *NEED APPOINTMENT* 30 tablet 0 Taking  . gabapentin (NEURONTIN) 300 MG capsule One tab PO qHS for a week, then BID for a week, then TID. May double weekly to a max of 3,600mg /day (Patient taking differently: Take 300 mg by mouth 3 (three) times daily. ) 180 capsule 3 05/24/2014 at Unknown time  . lisinopril-hydrochlorothiazide (PRINZIDE,ZESTORETIC) 20-25 MG per tablet TAKE 1 TABLET BY MOUTH DAILY. 30 tablet 5 05/24/2014 at Unknown time  . meloxicam (MOBIC) 15 MG tablet Take 15 mg by mouth daily.   05/20/2014  . metoprolol tartrate (LOPRESSOR) 25 MG tablet Take 25 mg by mouth 2 (two) times daily.   05/25/2014 at 0930  . traMADol (ULTRAM) 50 MG tablet Take 50 mg by mouth every 8 (eight) hours as needed for moderate pain.   05/24/2014 at Unknown time  . metoprolol succinate (TOPROL-XL) 25 MG 24 hr tablet Take 1 tablet (25 mg total) by mouth daily. (Patient not taking: Reported on 05/11/2014) 30 tablet 3 Not Taking at Unknown time  . oxyCODONE-acetaminophen (PERCOCET) 10-325 MG per tablet Take 1 tablet by mouth every 8 (eight) hours as needed for pain. (Patient not taking: Reported on 05/11/2014) 60 tablet 0 Not Taking at Unknown time   Allergies  Allergen Reactions  . Dilaudid [  Hydromorphone Hcl] Itching and Other (See Comments)    Couldn't sleep  . Morphine And Related Itching    History  Substance Use Topics  . Smoking status: Former Research scientist (life sciences)  . Smokeless tobacco: Not on file  . Alcohol Use: 8.4 oz/week    14 Cans of beer per week     Comment: 2 beers QD    Family History  Problem Relation Age of Onset  . Adopted: Yes     Review of Systems  Constitutional: Negative.   HENT: Negative.   Eyes:  Negative.   Respiratory: Negative.   Cardiovascular: Negative.   Gastrointestinal: Negative.   Genitourinary: Negative.   Musculoskeletal: Positive for joint pain.  Skin: Negative.   Neurological: Negative.   Endo/Heme/Allergies: Negative.   Psychiatric/Behavioral: Negative.     Objective:  Physical Exam  Vitals reviewed. Constitutional: He is oriented to person, place, and time. He appears well-developed and well-nourished.  HENT:  Head: Normocephalic and atraumatic.  Eyes: Conjunctivae and EOM are normal. Pupils are equal, round, and reactive to light.  Neck: Normal range of motion. Neck supple.  Cardiovascular: Normal rate, regular rhythm, normal heart sounds and intact distal pulses.   Respiratory: Effort normal and breath sounds normal. No respiratory distress.  GI: Soft. Bowel sounds are normal. He exhibits no distension. There is no tenderness.  Genitourinary:  deferred  Musculoskeletal:       Right hip: He exhibits decreased range of motion, crepitus and deformity.  Neurological: He is alert and oriented to person, place, and time. He has normal reflexes.  Skin: Skin is warm and dry.  Psychiatric: He has a normal mood and affect. His behavior is normal. Judgment and thought content normal.    Vital signs in last 24 hours: Temp:  [98.1 F (36.7 C)] 98.1 F (36.7 C) (05/23 1053) Pulse Rate:  [77] 77 (05/23 1053) Resp:  [18] 18 (05/23 1053) BP: (162)/(91) 162/91 mmHg (05/23 1053) SpO2:  [97 %] 97 % (05/23 1053) Weight:  [125.193 kg (276 lb)] 125.193 kg (276 lb) (05/23 1053)  Labs:   Estimated body mass index is 38.51 kg/(m^2) as calculated from the following:   Height as of this encounter: 5\' 11"  (1.803 m).   Weight as of this encounter: 125.193 kg (276 lb).   Imaging Review Plain radiographs demonstrate severe degenerative joint disease of the right hip with subluxation. The bone quality appears to be adequate for age and reported activity  level.  Assessment/Plan:  End stage arthritis, right hip(s)  The patient history, physical examination, clinical judgement of the provider and imaging studies are consistent with end stage degenerative joint disease of the right hip(s) and total hip arthroplasty is deemed medically necessary. The treatment options including medical management, injection therapy, arthroscopy and arthroplasty were discussed at length. The risks and benefits of total hip arthroplasty were presented and reviewed. The risks due to aseptic loosening, infection, stiffness, dislocation/subluxation,  thromboembolic complications and other imponderables were discussed.  The patient acknowledged the explanation, agreed to proceed with the plan and consent was signed. Patient is being admitted for inpatient treatment for surgery, pain control, PT, OT, prophylactic antibiotics, VTE prophylaxis, progressive ambulation and ADL's and discharge planning.The patient is planning to be discharged home with home health services

## 2014-05-25 NOTE — Anesthesia Procedure Notes (Signed)
Procedure Name: Intubation Date/Time: 05/25/2014 12:50 PM Performed by: Maryland Pink Pre-anesthesia Checklist: Patient identified, Emergency Drugs available, Suction available, Patient being monitored and Timeout performed Patient Re-evaluated:Patient Re-evaluated prior to inductionOxygen Delivery Method: Circle system utilized Preoxygenation: Pre-oxygenation with 100% oxygen Intubation Type: IV induction Ventilation: Two handed mask ventilation required Laryngoscope Size: Mac and 4 Grade View: Grade II Tube type: Oral Tube size: 7.5 mm Number of attempts: 1 Airway Equipment and Method: Stylet and LTA kit utilized Placement Confirmation: ETT inserted through vocal cords under direct vision,  positive ETCO2 and breath sounds checked- equal and bilateral Secured at: 22 cm Tube secured with: Tape Dental Injury: Teeth and Oropharynx as per pre-operative assessment

## 2014-05-25 NOTE — Transfer of Care (Signed)
Immediate Anesthesia Transfer of Care Note  Patient: Travis Huff  Procedure(s) Performed: Procedure(s): RIGHT TOTAL HIP ARTHROPLASTY ANTERIOR APPROACH (Right)  Patient Location: PACU  Anesthesia Type:General  Level of Consciousness: awake, alert  and oriented  Airway & Oxygen Therapy: Patient Spontanous Breathing and Patient connected to nasal cannula oxygen  Post-op Assessment: Report given to RN and Post -op Vital signs reviewed and stable  Post vital signs: Reviewed and stable  Last Vitals:  Filed Vitals:   05/25/14 1656  BP: 121/80  Pulse: 107  Temp: 36.8 C  Resp: 15    Complications: No apparent anesthesia complications

## 2014-05-26 ENCOUNTER — Encounter (HOSPITAL_COMMUNITY): Payer: Self-pay | Admitting: Orthopedic Surgery

## 2014-05-26 LAB — BASIC METABOLIC PANEL
Anion gap: 7 (ref 5–15)
BUN: 13 mg/dL (ref 6–20)
CO2: 26 mmol/L (ref 22–32)
Calcium: 8.4 mg/dL — ABNORMAL LOW (ref 8.9–10.3)
Chloride: 101 mmol/L (ref 101–111)
Creatinine, Ser: 0.98 mg/dL (ref 0.61–1.24)
GFR calc Af Amer: >60 mL/min (ref >60)
GFR calc non Af Amer: >60 mL/min (ref >60)
Glucose, Bld: 137 mg/dL — ABNORMAL HIGH (ref 65–99)
Potassium: 3.9 mmol/L (ref 3.5–5.1)
Sodium: 134 mmol/L — ABNORMAL LOW (ref 135–145)

## 2014-05-26 LAB — CBC
HCT: 35 % — ABNORMAL LOW (ref 39.0–52.0)
Hemoglobin: 11.5 g/dL — ABNORMAL LOW (ref 13.0–17.0)
MCH: 30.2 pg (ref 26.0–34.0)
MCHC: 32.9 g/dL (ref 30.0–36.0)
MCV: 91.9 fL (ref 78.0–100.0)
Platelets: 169 10*3/uL (ref 150–400)
RBC: 3.81 MIL/uL — ABNORMAL LOW (ref 4.22–5.81)
RDW: 13.4 % (ref 11.5–15.5)
WBC: 12 10*3/uL — ABNORMAL HIGH (ref 4.0–10.5)

## 2014-05-26 MED ORDER — DOCUSATE SODIUM 100 MG PO CAPS: 100.0000 mg | ORAL_CAPSULE | Freq: Two times a day (BID) | ORAL | Status: DC

## 2014-05-26 MED ORDER — ONDANSETRON HCL 4 MG PO TABS: 4.0000 mg | ORAL_TABLET | Freq: Four times a day (QID) | ORAL | Status: DC | PRN

## 2014-05-26 MED ORDER — METHOCARBAMOL 500 MG PO TABS: 500.0000 mg | ORAL_TABLET | Freq: Four times a day (QID) | ORAL | Status: DC | PRN

## 2014-05-26 MED ORDER — ASPIRIN 325 MG PO TBEC: 325.0000 mg | DELAYED_RELEASE_TABLET | Freq: Two times a day (BID) | ORAL | Status: DC

## 2014-05-26 MED ORDER — SENNA 8.6 MG PO TABS: 2.0000 | ORAL_TABLET | Freq: Every day | ORAL | Status: DC

## 2014-05-26 MED ORDER — HYDROCODONE-ACETAMINOPHEN 5-325 MG PO TABS: 1.0000 | ORAL_TABLET | ORAL | Status: DC | PRN

## 2014-05-26 NOTE — Anesthesia Postprocedure Evaluation (Signed)
  Anesthesia Post-op Note  Patient: Travis Huff  Procedure(s) Performed: Procedure(s): RIGHT TOTAL HIP ARTHROPLASTY ANTERIOR APPROACH (Right)  Patient Location: PACU  Anesthesia Type:General  Level of Consciousness: awake, alert , oriented and patient cooperative  Airway and Oxygen Therapy: Patient Spontanous Breathing  Post-op Pain: mild, moderate  Post-op Assessment: Post-op Vital signs reviewed, Patient's Cardiovascular Status Stable, Respiratory Function Stable, Patent Airway, No signs of Nausea or vomiting and Pain level controlled  Post-op Vital Signs: stable  Last Vitals:  Filed Vitals:   05/26/14 0554  BP: 110/84  Pulse: 87  Temp: 37.5 C  Resp:     Complications: No apparent anesthesia complications

## 2014-05-26 NOTE — Evaluation (Signed)
Physical Therapy Evaluation Patient Details Name: Travis Huff MRN: [id number] DOB: Nov 16, 1960 Today's Date: 05/26/2014   History of Present Illness  Patient is a 54 y/o male s/p R THA. PMH of HTN, viral pericarditis, asthma and arthritis.  Clinical Impression  Patient presents with discomfort and post surgical deficits RLE s/p above surgery impacting mobility. Pt will have 24/7 S at home from spouse (RN from [employer]) who is taking off work. Will need to negotiate steps in PM prior to discharge. Pt would benefit from skilled PT to improve transfers, gait, balance and mobility so pt can maximize independence and return to PLOF. Will return in PM for stair training.     Follow Up Recommendations Home health PT;Supervision/Assistance - 24 hour    Equipment Recommendations  None recommended by PT    Recommendations for Other Services       Precautions / Restrictions Precautions Precaution Comments: direct anterior hip - no precautions. Restrictions Weight Bearing Restrictions: Yes RLE Weight Bearing: Weight bearing as tolerated      Mobility  Bed Mobility               General bed mobility comments: Pt sitting EOB upon PT arrival.   Transfers Overall transfer level: Needs assistance Equipment used: Rolling walker (2 wheeled) Transfers: Sit to/from Stand Sit to Stand: Min guard         General transfer comment: Min guard for safety. Cues for hand placement.   Ambulation/Gait Ambulation/Gait assistance: Min guard Ambulation Distance (Feet): 150 Feet Assistive device: Rolling walker (2 wheeled) Gait Pattern/deviations: Step-through pattern;Antalgic   Gait velocity interpretation: Below normal speed for age/gender General Gait Details: Pt with mildly unsteady gait notable during turns. Cues for RW management/proximity.   Stairs            Wheelchair Mobility    Modified Rankin (Stroke Patients Only)       Balance Overall balance assessment: Needs  assistance Sitting-balance support: Feet supported;No upper extremity supported Sitting balance-Leahy Scale: Good     Standing balance support: During functional activity Standing balance-Leahy Scale: Fair                               Pertinent Vitals/Pain Pain Assessment: 0-10 Pain Score: 1  Pain Location: right hip Pain Descriptors / Indicators: Aching Pain Intervention(s): Monitored during session;Repositioned    Home Living Family/patient expects to be discharged to:: Private residence Living Arrangements: Spouse/significant other Available Help at Discharge: Family;Available 24 hours/day (Wife took off 2 weeks.) Type of Home: House Home Access: Stairs to enter Entrance Stairs-Rails: Right;Left;Can reach both Entrance Stairs-Number of Steps: 4 Home Layout: One level Home Equipment: Walker - 2 wheels;Walker - 4 wheels      Prior Function Level of Independence: Independent with assistive device(s)         Comments: Pt using RW for ~1 month prior to surgery.     Hand Dominance        Extremity/Trunk Assessment   Upper Extremity Assessment: Defer to OT evaluation           Lower Extremity Assessment: RLE deficits/detail RLE Deficits / Details: Decreased AROM hip flexion secondary to pain and surgery       Communication   Communication: No difficulties  Cognition Arousal/Alertness: Awake/alert Behavior During Therapy: WFL for tasks assessed/performed Overall Cognitive Status: Within Functional Limits for tasks assessed  General Comments      Exercises Total Joint Exercises Ankle Circles/Pumps: Both;15 reps;Supine Quad Sets: Right;10 reps;Supine Gluteal Sets: Both;10 reps;Seated Long Arc Quad: Right;10 reps;Seated      Assessment/Plan    PT Assessment Patient needs continued PT services  PT Diagnosis Difficulty walking;Generalized weakness   PT Problem List Pain;Decreased strength;Decreased range of  motion;Decreased activity tolerance;Decreased balance;Decreased mobility  PT Treatment Interventions Balance training;Gait training;Stair training;Functional mobility training;Therapeutic activities;Therapeutic exercise;Patient/family education;DME instruction   PT Goals (Current goals can be found in the Care Plan section) Acute Rehab PT Goals Patient Stated Goal: to go home later in the PM. PT Goal Formulation: With patient Time For Goal Achievement: 06/09/14 Potential to Achieve Goals: Good    Frequency 7X/week   Barriers to discharge        Co-evaluation               End of Session Equipment Utilized During Treatment: Gait belt Activity Tolerance: Patient tolerated treatment well Patient left: in chair;with call bell/phone within reach Nurse Communication: Mobility status         Time: 5686-1683 PT Time Calculation (min) (ACUTE ONLY): 33 min   Charges:   PT Evaluation $Initial PT Evaluation Tier I: 1 Procedure PT Treatments $Gait Training: 8-22 mins   PT G Codes:        Kenshin Splawn A Shauniece Kwan 05/26/2014, [place] AM Wray Kearns, [place], DPT [phone]

## 2014-05-26 NOTE — Progress Notes (Signed)
   Subjective:  Patient reports pain as mild.  No c/o. Denies N/V/CP/SOB.  Objective:   VITALS:   Filed Vitals:   05/25/14 1900 05/25/14 2039 05/26/14 0212 05/26/14 0554  BP: 108/70 138/78 120/85 110/84  Pulse: 83 54 85 87  Temp: 98.7 F (37.1 C) 98.7 F (37.1 C) 98.1 F (36.7 C) 99.5 F (37.5 C)  TempSrc:  Oral Oral Oral  Resp: 16     Height:      Weight:      SpO2: 99% 99% 99% 99%    ABD soft Sensation intact distally Intact pulses distally Dorsiflexion/Plantar flexion intact Incision: dressing C/D/I Compartment soft   Lab Results  Component Value Date   WBC 12.0* 05/26/2014   HGB 11.5* 05/26/2014   HCT 35.0* 05/26/2014   MCV 91.9 05/26/2014   PLT 169 05/26/2014   BMET    Component Value Date/Time   NA 134* 05/26/2014 0547   K 3.9 05/26/2014 0547   CL 101 05/26/2014 0547   CO2 26 05/26/2014 0547   GLUCOSE 137* 05/26/2014 0547   BUN 13 05/26/2014 0547   CREATININE 0.98 05/26/2014 0547   CREATININE 0.77 04/01/2012 1357   CALCIUM 8.4* 05/26/2014 0547   GFRNONAA >60 05/26/2014 0547   GFRAA >60 05/26/2014 0547     Assessment/Plan: 1 Day Post-Op   Principal Problem:   Osteoarthritis of right hip Active Problems:   Primary osteoarthritis of right hip   WBAT with walker PO pain control IV ancef x23 hrs DVT ppx: ASA, SCDs, TEDs Advance diet Up with therapy D/C IV fluids Discharge home with home health D/C home today vs tomorrow   Elie Goody 05/26/2014, 8:00 AM   Rod Can, MD Cell [phone]

## 2014-05-26 NOTE — Progress Notes (Signed)
Physical Therapy Treatment Patient Details Name: Travis Huff MRN: [id number] DOB: 01-Dec-1960 Today's Date: 05/26/2014    History of Present Illness Patient is a 54 y/o male s/p R THA. PMH of HTN, viral pericarditis, asthma and arthritis.    PT Comments    Patient progressing well with mobility. Performed stair training with spouse present. Discussed how to get into/out of car for transportation home. Reviewed HEP. Pt will have 24/7 S at home. All questions answered. Pt is safe to discharge from a mobility standpoint. Will continue to follow if still in hospital tomorrow.   Follow Up Recommendations  Home health PT;Supervision/Assistance - 24 hour     Equipment Recommendations  None recommended by PT    Recommendations for Other Services       Precautions / Restrictions Precautions Precautions: Fall Precaution Comments: direct anterior hip - no precautions. Restrictions Weight Bearing Restrictions: Yes RLE Weight Bearing: Weight bearing as tolerated    Mobility  Bed Mobility Overal bed mobility: Needs Assistance Bed Mobility: Supine to Sit;Sit to Supine     Supine to sit: Modified independent (Device/Increase time) Sit to supine: Modified independent (Device/Increase time)   General bed mobility comments: HOB flat, no use of rails. Cues for technique. Instructed pt to use sheet to assist lifting LLE into bed.  Transfers Overall transfer level: Needs assistance Equipment used: Rolling walker (2 wheeled) Transfers: Sit to/from Stand Sit to Stand: Supervision         General transfer comment: Cues for RW safety, sequence, and overall safety. Stood from [employer], from chair x1.  Ambulation/Gait Ambulation/Gait assistance: Min guard Ambulation Distance (Feet): 200 Feet Assistive device: Rolling walker (2 wheeled) Gait Pattern/deviations: Step-through pattern;Antalgic;Trunk flexed   Gait velocity interpretation: Below normal speed for age/gender General Gait  Details: Steady gait. Cues for RW safety.   Stairs Stairs: Yes Stairs assistance: Min guard Stair Management: Two rails;Step to pattern Number of Stairs: 2 (x2 bouts) General stair comments: Cues for technique.   Wheelchair Mobility    Modified Rankin (Stroke Patients Only)       Balance Overall balance assessment: Needs assistance Sitting-balance support: Feet supported;No upper extremity supported Sitting balance-Leahy Scale: Good     Standing balance support: During functional activity Standing balance-Leahy Scale: Fair                      Cognition Arousal/Alertness: Awake/alert Behavior During Therapy: WFL for tasks assessed/performed Overall Cognitive Status: Within Functional Limits for tasks assessed                      Exercises      General Comments General comments (skin integrity, edema, etc.): Performed tub transfer per pt request in gym. Cues for technique. Lengthy discussion on how to get into car.      Pertinent Vitals/Pain Pain Assessment: 0-10 Pain Score: 4  Pain Location: right hip Pain Descriptors / Indicators: Sore;Aching Pain Intervention(s): Monitored during session;Repositioned;Premedicated before session    Home Living Family/patient expects to be discharged to:: Private residence Living Arrangements: Spouse/significant other Available Help at Discharge: Family;Available 24 hours/day (wife took 2 weeks off) Type of Home: House Home Access: Stairs to enter Entrance Stairs-Rails: Right;Left;Can reach both Home Layout: One level Home Equipment: Environmental consultant - 2 wheels;Walker - 4 wheels;Bedside commode      Prior Function Level of Independence: Independent with assistive device(s)      Comments: Pt using RW for ~1 month prior to surgery.  PT Goals (current goals can now be found in the care plan section) Acute Rehab PT Goals Patient Stated Goal: go home today Progress towards PT goals: Progressing toward goals     Frequency  7X/week    PT Plan Current plan remains appropriate    Co-evaluation             End of Session Equipment Utilized During Treatment: Gait belt Activity Tolerance: Patient tolerated treatment well Patient left: in bed;with call bell/phone within reach;with family/visitor present     Time: 0051-1021 PT Time Calculation (min) (ACUTE ONLY): 29 min  Charges:  $Gait Training: 8-22 mins $Therapeutic Activity: 8-22 mins                    G Codes:      Lawson Isabell A Carlus Stay 05/26/2014, 3:27 PM Wray Kearns, [place], DPT [phone]

## 2014-05-26 NOTE — Anesthesia Preprocedure Evaluation (Signed)
Anesthesia Evaluation  Patient identified by MRN, date of birth, ID band Patient awake    Airway Mallampati: I       Dental   Pulmonary asthma , former smoker,    Pulmonary exam normal       Cardiovascular hypertension, Normal cardiovascular examRhythm:Regular Rate:Normal     Neuro/Psych    GI/Hepatic   Endo/Other    Renal/GU      Musculoskeletal  (+) Arthritis -,   Abdominal   Peds  Hematology   Anesthesia Other Findings   Reproductive/Obstetrics                             Anesthesia Physical Anesthesia Plan  ASA: II  Anesthesia Plan: General   Post-op Pain Management:    Induction: Intravenous  Airway Management Planned: Oral ETT  Additional Equipment:   Intra-op Plan:   Post-operative Plan: Extubation in OR  Informed Consent: I have reviewed the patients History and Physical, chart, labs and discussed the procedure including the risks, benefits and alternatives for the proposed anesthesia with the patient or authorized representative who has indicated his/her understanding and acceptance.     Plan Discussed with: CRNA, Anesthesiologist and Surgeon  Anesthesia Plan Comments:         Anesthesia Quick Evaluation

## 2014-05-26 NOTE — Progress Notes (Signed)
[name] discharged home per MD order. Discharge instructions reviewed and discussed with patient. All questions and concerns answered. Copy of instructions and scripts given to patient. IV removed.  Patient escorted to car by staff in a wheelchair. No distress noted upon discharge.   Yadiel, Aubry 05/26/2014 5:21 PM

## 2014-05-26 NOTE — Progress Notes (Signed)
Occupational Therapy Evaluation Patient Details Name: Travis Huff MRN: [id number] DOB: [birth year] Today's Date: 05/26/2014    History of Present Illness Patient is a 54 y/o male s/p R THA. PMH of HTN, viral pericarditis, asthma and arthritis.   Clinical Impression   Patient independent > mod I PTA. Patient currently functioning at an overall supervision>min assist level. Patient will benefit from acute OT to increase overall independence in the areas of ADLs, functional mobility, and overall safety in order to safely discharge home with wife.     Follow Up Recommendations  Supervision - Intermittent;No OT follow up    Equipment Recommendations  None recommended by OT    Recommendations for Other Services  None at this time   Precautions / Restrictions Precautions Precautions: Fall Precaution Comments: direct anterior hip - no precautions. Restrictions Weight Bearing Restrictions: Yes RLE Weight Bearing: Weight bearing as tolerated      Mobility Bed Mobility General bed mobility comments: Pt sitting EOB upon OT arrival.   Transfers Overall transfer level: Needs assistance Equipment used: Rolling walker (2 wheeled) Transfers: Sit to/from Stand Sit to Stand: Supervision General transfer comment: Cues for RW safety, sequence, and overall safety    Balance Overall balance assessment: Needs assistance Sitting-balance support: No upper extremity supported;Feet supported Sitting balance-Leahy Scale: Good     Standing balance support: Bilateral upper extremity supported;During functional activity Standing balance-Leahy Scale: Fair    ADL Overall ADL's : Needs assistance/impaired General ADL Comments: Pt unable to reach RLE at this time, encouraged pt to work on reaching for a stretch to increase independence with ADLs. Pt's wife can assist with tthis as well. Pt ambulated <> BR for toilet transfer, able to sit on elevated toilet seat and will be able to push up from  sink with RW in front. Discussed tub/shower transfers, recommending sponge bathing until pt gets stronger. Also recommended "dry-runs" with shower transfers prior to attempting actual shower. Will continue to follow pt acutely, no HH recommended at this time. Wife states she has a BSC pt can use.  Talked with patient about importance of him listening to his body. When he ambulated out of the BR, he was pale and with complaints of some lightheadedness. Hemoglobin low after surgery per chart review. Discussed importance of sitting for rest breaks prn; wife reiterated importance of this.     Pertinent Vitals/Pain Pain Assessment: 0-10 Pain Score: 4  Pain Location: right hip Pain Descriptors / Indicators: Aching Pain Intervention(s): Monitored during session;Repositioned     Hand Dominance Right   Extremity/Trunk Assessment Upper Extremity Assessment Upper Extremity Assessment: Overall WFL for tasks assessed   Lower Extremity Assessment Lower Extremity Assessment: Defer to PT evaluation RLE Deficits / Details: Decreased AROM hip flexion secondary to pain and surgery RLE Sensation:  [facility]   Cervical / Trunk Assessment Cervical / Trunk Assessment: Normal   Communication Communication Communication: No difficulties   Cognition Arousal/Alertness: Awake/alert Behavior During Therapy: WFL for tasks assessed/performed Overall Cognitive Status: Within Functional Limits for tasks assessed              Home Living Family/patient expects to be discharged to:: Private residence Living Arrangements: Spouse/significant other Available Help at Discharge: Family;Available 24 hours/day (wife took 2 weeks off) Type of Home: House Home Access: Stairs to enter [employer] of Steps: 4 Entrance Stairs-Rails: Right;Left;Can reach both Home Layout: One level     Bathroom Shower/Tub: Corporate investment banker: Handicapped height     Home Equipment:  Walker - 2  wheels;Walker - 4 wheels;Bedside commode    Prior Functioning/Environment Level of Independence: Independent with assistive device(s)  Comments: Pt using RW for ~1 month prior to surgery.    OT Diagnosis: Generalized weakness;Acute pain   OT Problem List: Decreased strength;Decreased range of motion;Decreased activity tolerance;Impaired balance (sitting and/or standing);Pain;Decreased safety awareness;Decreased knowledge of use of DME or AE;Decreased knowledge of precautions   OT Treatment/Interventions: Self-care/ADL training;Therapeutic exercise;Energy conservation;DME and/or AE instruction;Therapeutic activities;Patient/family education;Balance training    OT Goals(Current goals can be found in the care plan section) Acute Rehab OT Goals Patient Stated Goal: go home today OT Goal Formulation: With patient/family Time For Goal Achievement: 06/02/14 Potential to Achieve Goals: Good ADL Goals Pt Will Perform Grooming: standing;with modified independence Pt Will Perform Lower Body Bathing: with modified independence;sit to/from stand Pt Will Perform Lower Body Dressing: with modified independence;sit to/from stand Pt Will Transfer to Toilet: with modified independence;bedside commode;ambulating Pt Will Perform Tub/Shower Transfer: with supervision;Tub transfer;ambulating;3 in 1;rolling walker Additional ADL Goal #1: Pt will perform ADLs and functional mobility at an overall mod I level, using RW, and able to take rest breaks prn and appropriately  OT Frequency: Min 2X/week   Barriers to D/C: None known at this time   End of Session Equipment Utilized During Treatment: Rolling walker  Activity Tolerance: Patient tolerated treatment well Patient left: in chair;with call bell/phone within reach;with family/visitor present   Time: 5597-4163 OT Time Calculation (min): 31 min Charges:  OT General Charges $OT Visit: 1 Procedure OT Evaluation $Initial OT Evaluation Tier I: 1  Procedure OT Treatments $Self Care/Home Management : 8-22 mins  Alexsys Eskin , MS, OTR/L, CLT Pager: [phone]  05/26/2014, 1:26 PM

## 2014-05-26 NOTE — Discharge Summary (Signed)
Physician Discharge Summary  Patient ID: Travis Huff MRN: [id number] DOB/AGE: [birth year] 54 y.o.  Admit date: 05/25/2014 Discharge date: 05/26/2014  Admission Diagnoses:  Osteoarthritis of right hip  Discharge Diagnoses:  Principal Problem:   Osteoarthritis of right hip Active Problems:   Primary osteoarthritis of right hip   Past Medical History  Diagnosis Date  . Hypertension   . Asthma   . Viral pericarditis   . Arthritis     Surgeries: Procedure(s): RIGHT TOTAL HIP ARTHROPLASTY ANTERIOR APPROACH on 05/25/2014   Consultants (if any):    Discharged Condition: Improved  Hospital Course: Travis Huff is an 54 y.o. male who was admitted 05/25/2014 with a diagnosis of Osteoarthritis of right hip and went to the operating room on 05/25/2014 and underwent the above named procedures.    He was given perioperative antibiotics:      Anti-infectives    Start     Dose/Rate Route Frequency Ordered Stop   05/25/14 2000  ceFAZolin (ANCEF) IVPB 2 g/50 mL premix     2 g 100 mL/hr over 30 Minutes Intravenous Every 6 hours 05/25/14 1954 05/26/14 0242   05/25/14 0600  ceFAZolin (ANCEF) 3 g in dextrose 5 % 50 mL IVPB     3 g 160 mL/hr over 30 Minutes Intravenous To Surgery 05/24/14 1501 05/25/14 1252    .  He was given sequential compression devices, early ambulation, and ASA for DVT prophylaxis.  He benefited maximally from the hospital stay and there were no complications.    Recent vital signs:  Filed Vitals:   05/26/14 0554  BP: 110/84  Pulse: 87  Temp: 99.5 F (37.5 C)  Resp:     Recent laboratory studies:  Lab Results  Component Value Date   HGB 11.5* 05/26/2014   HGB 14.3 05/25/2014   HGB 15.6 05/14/2014   Lab Results  Component Value Date   WBC 12.0* 05/26/2014   PLT 169 05/26/2014   Lab Results  Component Value Date   INR 1.01 05/14/2014   Lab Results  Component Value Date   NA 134* 05/26/2014   K 3.9 05/26/2014   CL 101 05/26/2014   CO2 26  05/26/2014   BUN 13 05/26/2014   CREATININE 0.98 05/26/2014   GLUCOSE 137* 05/26/2014    Discharge Medications:     Medication List    STOP taking these medications        metoprolol succinate 25 MG 24 hr tablet  Commonly known as:  TOPROL-XL     oxyCODONE-acetaminophen 10-325 MG per tablet  Commonly known as:  PERCOCET     traMADol 50 MG tablet  Commonly known as:  ULTRAM      TAKE these medications        aspirin 325 MG EC tablet  Take 1 tablet (325 mg total) by mouth 2 (two) times daily after a meal.     CIALIS 5 MG tablet  Generic drug:  tadalafil  TAKE 1 TABLET BY MOUTH DAILY *NEED APPOINTMENT*     docusate sodium 100 MG capsule  Commonly known as:  COLACE  Take 1 capsule (100 mg total) by mouth 2 (two) times daily.     gabapentin 300 MG capsule  Commonly known as:  NEURONTIN  One tab PO qHS for a week, then BID for a week, then TID. May double weekly to a max of 3,600mg /day     HYDROcodone-acetaminophen 5-325 MG per tablet  Commonly known as:  NORCO/VICODIN  Take 1-2 tablets  by mouth every 4 (four) hours as needed (breakthrough pain).     lisinopril-hydrochlorothiazide 20-25 MG per tablet  Commonly known as:  PRINZIDE,ZESTORETIC  TAKE 1 TABLET BY MOUTH DAILY.     meloxicam 15 MG tablet  Commonly known as:  MOBIC  Take 15 mg by mouth daily.     methocarbamol 500 MG tablet  Commonly known as:  ROBAXIN  Take 1 tablet (500 mg total) by mouth every 6 (six) hours as needed for muscle spasms.     metoprolol tartrate 25 MG tablet  Commonly known as:  LOPRESSOR  Take 25 mg by mouth 2 (two) times daily.     ondansetron 4 MG tablet  Commonly known as:  ZOFRAN  Take 1 tablet (4 mg total) by mouth every 6 (six) hours as needed for nausea.     senna 8.6 MG Tabs tablet  Commonly known as:  SENOKOT  Take 2 tablets (17.2 mg total) by mouth at bedtime.        Diagnostic Studies: Dg Pelvis Portable  05/25/2014   CLINICAL DATA:  Postop right hip arthroplasty   EXAM: PORTABLE PELVIS 1-2 VIEWS  COMPARISON:  Intraoperative right hip fluoroscopic images dated 05/25/2014  FINDINGS: Right hip arthroplasty in satisfactory position on this single frontal view.  No fracture or dislocation is seen.  Mild degenerative changes of the left hip.  IMPRESSION: Right hip arthroplasty in satisfactory position.   Electronically Signed   By: Julian Hy M.D.   On: 05/25/2014 17:08   Dg Hip Operative Unilat With Pelvis Right  05/25/2014   CLINICAL DATA:  Intraoperative right hip imaging during right hip arthroplasty.  EXAM: OPERATIVE RIGHT HIP (WITH PELVIS IF PERFORMED) 2 VIEWS  TECHNIQUE: Fluoroscopic spot image(s) were submitted for interpretation post-operatively.  FLUOROSCOPY TIME:  Radiation Exposure Index (as provided by the fluoroscopic device):  If the device does not provide the exposure index:  Fluoroscopy Time:  0 minutes and 46 seconds  Number of Acquired Images:  2  COMPARISON:  None.  FINDINGS: Right hip total arthroplasty has been performed. The femoral and acetabular components appear well seated and aligned on the submitted images. There is no acute fracture or evidence of an operative complication.  IMPRESSION: Well-aligned right hip prosthesis.   Electronically Signed   By: Lajean Manes M.D.   On: 05/25/2014 18:47    Disposition: 01-Home or Self Care  Discharge Instructions    Call MD / Call 911    Complete by:  As directed   If you experience chest pain or shortness of breath, CALL 911 and be transported to the hospital emergency room.  If you develope a fever above 101 F, pus (white drainage) or increased drainage or redness at the wound, or calf pain, call your surgeon's office.     Constipation Prevention    Complete by:  As directed   Drink plenty of fluids.  Prune juice may be helpful.  You may use a stool softener, such as Colace (over the counter) 100 mg twice a day.  Use MiraLax (over the counter) for constipation as needed.     Diet - low  sodium heart healthy    Complete by:  As directed      Driving restrictions    Complete by:  As directed   No driving for 6 weeks     Increase activity slowly as tolerated    Complete by:  As directed      Lifting restrictions  Complete by:  As directed   No lifting for 6 weeks     TED hose    Complete by:  As directed   Use stockings (TED hose) for 2 weeks on both leg(s).  You may remove them at night for sleeping.           Follow-up Information    Follow up with Beronica Lansdale, Horald Pollen, MD. Schedule an appointment as soon as possible for a visit in 2 weeks.   Specialty:  Orthopedic Surgery   Why:  For wound re-check   Contact information:   [address] [place] [address] [phone]        Signed: Elie Goody 05/26/2014, 5:46 PM

## 2014-06-08 ENCOUNTER — Other Ambulatory Visit: Payer: Self-pay | Admitting: Sports Medicine

## 2014-06-11 ENCOUNTER — Other Ambulatory Visit: Payer: Self-pay | Admitting: Sports Medicine

## 2014-10-06 ENCOUNTER — Other Ambulatory Visit: Payer: Self-pay

## 2014-10-06 MED ORDER — CIALIS 5 MG PO TABS: 5.0000 mg | ORAL_TABLET | Freq: Every day | ORAL | Status: DC | PRN

## 2014-10-09 ENCOUNTER — Ambulatory Visit (INDEPENDENT_AMBULATORY_CARE_PROVIDER_SITE_OTHER): Payer: 59 | Admitting: Sports Medicine

## 2014-10-09 DIAGNOSIS — M4806 Spinal stenosis, lumbar region: Secondary | ICD-10-CM | POA: Diagnosis not present

## 2014-10-09 DIAGNOSIS — M48062 Spinal stenosis, lumbar region with neurogenic claudication: Secondary | ICD-10-CM

## 2014-10-09 MED ORDER — CYCLOBENZAPRINE HCL 10 MG PO TABS: ORAL_TABLET | ORAL | Status: DC

## 2014-10-09 MED ORDER — OXYCODONE-ACETAMINOPHEN 5-325 MG PO TABS: 1.0000 | ORAL_TABLET | Freq: Three times a day (TID) | ORAL | Status: DC | PRN

## 2014-10-09 NOTE — Progress Notes (Signed)
  Subjective:    CC: Follow-up  HPI: Loyalty returns, he had his total hip arthroplasty and is doing extremely well, continues to have severe back pain with bilateral radiation, better with flexion, and consistent with his severe multilevel lumbar spinal stenosis with neurogenic claudication, he has been through facet repeat conceivably ablation provided some good relief of his facet mediated pain but continues to have axial pain, he has seen both neurosurgery and orthopedic spine surgery, both are recommending multilevel L4-S1 decompression with fusion. I have recommended that he proceed. No bowel or bladder dysfunction or saddle numbness.  Past medical history, Surgical history, Family history not pertinant except as noted below, Social history, Allergies, and medications have been entered into the medical record, reviewed, and no changes needed.   Review of Systems: No fevers, chills, night sweats, weight loss, chest pain, or shortness of breath.   Objective:    General: Well Developed, well nourished, and in no acute distress.  Neuro: Alert and oriented x3, extra-ocular muscles intact, sensation grossly intact.  HEENT: Normocephalic, atraumatic, pupils equal round reactive to light, neck supple, no masses, no lymphadenopathy, thyroid nonpalpable.  Skin: Warm and dry, no rashes. Cardiac: Regular rate and rhythm, no murmurs rubs or gallops, no lower extremity edema.  Respiratory: Clear to auscultation bilaterally. Not using accessory muscles, speaking in full sentences.  Impression and Recommendations:

## 2014-10-09 NOTE — Assessment & Plan Note (Addendum)
Has still not proceeded with L4-S1 fusion. I am going to refill his oxycodone, he has been through facet radio frequency ablation, unfortunately with persistent pain both his neurosurgeon and orthopedic spine surgeon are recommending multilevel lumbar decompression. Adding Flexeril for evening pain, and a bit of oxycodone for daytime pain. I am going to refer him back to Dr. Nelva Bush for consideration of epidurals.

## 2014-12-10 ENCOUNTER — Other Ambulatory Visit: Payer: Self-pay | Admitting: Sports Medicine

## 2014-12-21 ENCOUNTER — Encounter: Payer: Self-pay | Admitting: Sports Medicine

## 2014-12-21 ENCOUNTER — Ambulatory Visit (INDEPENDENT_AMBULATORY_CARE_PROVIDER_SITE_OTHER): Payer: 59 | Admitting: Sports Medicine

## 2014-12-21 VITALS — BP 141/99 | HR 94 | Temp 98.6°F | Resp 18 | Wt 278.0 lb

## 2014-12-21 DIAGNOSIS — M4806 Spinal stenosis, lumbar region: Secondary | ICD-10-CM

## 2014-12-21 DIAGNOSIS — M48062 Spinal stenosis, lumbar region with neurogenic claudication: Secondary | ICD-10-CM

## 2014-12-21 MED ORDER — OXYCODONE-ACETAMINOPHEN 5-325 MG PO TABS: 1.0000 | ORAL_TABLET | Freq: Three times a day (TID) | ORAL | Status: DC | PRN

## 2014-12-21 NOTE — Assessment & Plan Note (Signed)
Still waiting L4-S1 fusion. I am going to refill his oxycodone, he uses approximately 1 per day, and understands that we are going to also refer him to pain management. He has been through facet radiofrequency ablation, and also has significant lumbar spinal stenosis with neurogenic claudication. Both his neurosurgeon and orthopedic spine surgeon have recommended multilevel lumbar decompressive procedure. Switching to Celebrex for daily use, Flexeril at bedtime, gabapentin was ineffective even at the maximum dose. Refilling oxycodone.

## 2014-12-21 NOTE — Progress Notes (Signed)
  Subjective:    CC: follow-up  HPI: This is a pleasant 54 year old male with known end-stage lumbar spondylosis, he has had multiple facet radiofrequency ablations as well as multiple epidurals, he has seen his neurosurgeon, and has had a second opinion with orthopedic spine surgery, both recommending an L4-S1 type fusion procedure.  He continues to have pain, and is not yet ready to consider the fusion mostly due to work.  He wonders if we can continue with medical pain management for now.  Past medical history, Surgical history, Family history not pertinant except as noted below, Social history, Allergies, and medications have been entered into the medical record, reviewed, and no changes needed.   Review of Systems: No fevers, chills, night sweats, weight loss, chest pain, or shortness of breath.   Objective:    General: Well Developed, well nourished, and in no acute distress.  Neuro: Alert and oriented x3, extra-ocular muscles intact, sensation grossly intact.  HEENT: Normocephalic, atraumatic, pupils equal round reactive to light, neck supple, no masses, no lymphadenopathy, thyroid nonpalpable.  Skin: Warm and dry, no rashes. Cardiac: Regular rate and rhythm, no murmurs rubs or gallops, no lower extremity edema.  Respiratory: Clear to auscultation bilaterally. Not using accessory muscles, speaking in full sentences.  Impression and Recommendations:    I spent 25 minutes with this patient, greater than 50% was face-to-face time counseling regarding the above diagnoses

## 2014-12-22 ENCOUNTER — Telehealth: Payer: Self-pay

## 2014-12-22 ENCOUNTER — Other Ambulatory Visit: Payer: Self-pay | Admitting: Sports Medicine

## 2014-12-22 MED ORDER — CELECOXIB 200 MG PO CAPS: ORAL_CAPSULE | ORAL | Status: DC

## 2014-12-22 NOTE — Telephone Encounter (Signed)
Patient called and left a message stating the pharmacy didn't receive Celebrex prescription. [employer].

## 2014-12-22 NOTE — Telephone Encounter (Signed)
Swoops. Done.

## 2015-01-19 ENCOUNTER — Other Ambulatory Visit: Payer: Self-pay | Admitting: Sports Medicine

## 2015-01-19 MED FILL — CIALIS 5 MG TABLET: 5 | 30 days supply | Qty: 30 | Fill #0

## 2015-01-22 MED FILL — CELECOXIB 200 MG CAPSULE: 200 | 30 days supply | Qty: 60 | Fill #1

## 2015-01-25 ENCOUNTER — Other Ambulatory Visit: Payer: Self-pay | Admitting: Sports Medicine

## 2015-01-25 MED FILL — CYCLOBENZAPRINE 10 MG TAB: 10 | 10 days supply | Qty: 30 | Fill #0

## 2015-02-24 ENCOUNTER — Other Ambulatory Visit: Payer: Self-pay | Admitting: Sports Medicine

## 2015-02-24 MED FILL — CELECOXIB 200 MG CAPSULE: 200 | 30 days supply | Qty: 60 | Fill #2

## 2015-02-24 MED FILL — CYCLOBENZAPRINE 10 MG TAB: 10 | 10 days supply | Qty: 30 | Fill #0

## 2015-03-22 ENCOUNTER — Ambulatory Visit: Payer: 59 | Admitting: Sports Medicine

## 2015-03-23 ENCOUNTER — Other Ambulatory Visit: Payer: Self-pay | Admitting: Sports Medicine

## 2015-03-23 MED FILL — LISINOPRIL-HCTZ 20-25 MG TA: 20-25 | 90 days supply | Qty: 90 | Fill #1

## 2015-03-24 MED FILL — CYCLOBENZAPRINE 10 MG TAB: 10 | 10 days supply | Qty: 30 | Fill #0

## 2015-03-26 ENCOUNTER — Other Ambulatory Visit: Payer: Self-pay | Admitting: Sports Medicine

## 2015-03-26 MED FILL — CELECOXIB 200 MG CAPSULE: 200 | 30 days supply | Qty: 60 | Fill #0

## 2015-04-20 ENCOUNTER — Other Ambulatory Visit: Payer: Self-pay | Admitting: Sports Medicine

## 2015-04-20 MED FILL — CYCLOBENZAPRINE 10 MG TAB: 10 | 10 days supply | Qty: 30 | Fill #0

## 2015-04-20 MED FILL — CIALIS 5 MG TABLET: 5 | 30 days supply | Qty: 30 | Fill #0

## 2015-04-27 MED FILL — CELECOXIB 200 MG CAPSULE: 200 | 30 days supply | Qty: 60 | Fill #1

## 2015-05-07 ENCOUNTER — Telehealth: Payer: Self-pay

## 2015-05-07 NOTE — Telephone Encounter (Signed)
Travis Huff called and states he would like a handicap placard and a note for work restricting amount he can lift. He wants the weight restriction to be less than 25 pounds. Please advise.

## 2015-05-10 NOTE — Telephone Encounter (Signed)
Note and handicapped Application are in box.

## 2015-05-12 NOTE — Telephone Encounter (Signed)
Called patient  advised that letter was ready for pickup. Rhonda Cunningham,CMA

## 2015-05-25 ENCOUNTER — Other Ambulatory Visit: Payer: Self-pay | Admitting: Sports Medicine

## 2015-05-25 MED FILL — CYCLOBENZAPRINE 10 MG TAB: 10 | 10 days supply | Qty: 30 | Fill #0

## 2015-05-27 MED FILL — CELECOXIB 200 MG CAPSULE: 200 | 30 days supply | Qty: 60 | Fill #2

## 2015-06-14 ENCOUNTER — Other Ambulatory Visit: Payer: Self-pay | Admitting: Sports Medicine

## 2015-06-14 MED FILL — CIALIS 5 MG TABLET: 5 | 30 days supply | Qty: 30 | Fill #0

## 2015-06-23 ENCOUNTER — Other Ambulatory Visit: Payer: Self-pay | Admitting: Sports Medicine

## 2015-06-24 ENCOUNTER — Other Ambulatory Visit: Payer: Self-pay

## 2015-06-24 MED ORDER — LISINOPRIL-HYDROCHLOROTHIAZIDE 20-25 MG PO TABS: 1.0000 | ORAL_TABLET | Freq: Every day | ORAL | Status: DC

## 2015-06-24 MED ORDER — CYCLOBENZAPRINE HCL 10 MG PO TABS: 10.0000 mg | ORAL_TABLET | Freq: Three times a day (TID) | ORAL | Status: DC

## 2015-06-24 MED FILL — CYCLOBENZAPRINE 10 MG TAB: 10 | 10 days supply | Qty: 30 | Fill #0

## 2015-06-24 MED FILL — LISINOPRIL-HCTZ 20-25 MG TA: 20-25 | 30 days supply | Qty: 30 | Fill #0

## 2015-06-28 ENCOUNTER — Ambulatory Visit (INDEPENDENT_AMBULATORY_CARE_PROVIDER_SITE_OTHER): Payer: 59 | Admitting: Sports Medicine

## 2015-06-28 VITALS — BP 145/85 | HR 90 | Resp 18 | Wt 276.6 lb

## 2015-06-28 DIAGNOSIS — M4806 Spinal stenosis, lumbar region: Secondary | ICD-10-CM

## 2015-06-28 DIAGNOSIS — I10 Essential (primary) hypertension: Secondary | ICD-10-CM

## 2015-06-28 DIAGNOSIS — M48062 Spinal stenosis, lumbar region with neurogenic claudication: Secondary | ICD-10-CM

## 2015-06-28 DIAGNOSIS — N529 Male erectile dysfunction, unspecified: Secondary | ICD-10-CM | POA: Diagnosis not present

## 2015-06-28 MED ORDER — TADALAFIL 20 MG PO TABS: 20.0000 mg | ORAL_TABLET | Freq: Every day | ORAL | Status: DC | PRN

## 2015-06-28 MED ORDER — CELECOXIB 200 MG PO CAPS: ORAL_CAPSULE | ORAL | Status: DC

## 2015-06-28 MED ORDER — LISINOPRIL-HYDROCHLOROTHIAZIDE 20-25 MG PO TABS: 1.0000 | ORAL_TABLET | Freq: Every day | ORAL | Status: DC

## 2015-06-28 MED ORDER — CYCLOBENZAPRINE HCL 10 MG PO TABS: 10.0000 mg | ORAL_TABLET | Freq: Every day | ORAL | Status: DC

## 2015-06-28 MED ORDER — NEBIVOLOL HCL 10 MG PO TABS: 10.0000 mg | ORAL_TABLET | Freq: Every day | ORAL | Status: DC

## 2015-06-28 MED FILL — BYSTOLIC 10 MG TABLET: 10 | 90 days supply | Qty: 90 | Fill #0

## 2015-06-28 MED FILL — CIALIS 20 MG TABLET: 20 | 30 days supply | Qty: 6 | Fill #0

## 2015-06-28 MED FILL — CELECOXIB 200 MG CAPSULE: 200 | 90 days supply | Qty: 180 | Fill #0

## 2015-06-28 NOTE — Assessment & Plan Note (Signed)
Overall doing well post-lumbar fusion, understands we will not be performing narcotic pain management, he does just need a refill of his Flexeril, he is fairly stable on this.

## 2015-06-28 NOTE — Progress Notes (Signed)
  Subjective:    CC: Follow-up  HPI: Hypertension: Elevated but not taking his metoprolol, no visual changes, headaches, chest pain. Did get some erectile dysfunction with metoprolol and wonders if there is an alternative.  Erectile dysfunction: Does well with 20 mg of Cialis, needs a refill.  Lumbar spondylosis: Post-lumbar fusion, needs a refill on Flexeril.  Past medical history, Surgical history, Family history not pertinant except as noted below, Social history, Allergies, and medications have been entered into the medical record, reviewed, and no changes needed.   Review of Systems: No fevers, chills, night sweats, weight loss, chest pain, or shortness of breath.   Objective:    General: Well Developed, well nourished, and in no acute distress.  Neuro: Alert and oriented x3, extra-ocular muscles intact, sensation grossly intact.  HEENT: Normocephalic, atraumatic, pupils equal round reactive to light, neck supple, no masses, no lymphadenopathy, thyroid nonpalpable.  Skin: Warm and dry, no rashes. Cardiac: Regular rate and rhythm, no murmurs rubs or gallops, no lower extremity edema.  Respiratory: Clear to auscultation bilaterally. Not using accessory muscles, speaking in full sentences.  Impression and Recommendations:    I spent 25 minutes with this patient, greater than 50% was face-to-face time counseling regarding the above diagnoses

## 2015-06-28 NOTE — Assessment & Plan Note (Addendum)
Refilling lisinopril/HCTZ, unfortunately did get some erectile dysfunction on the metoprolol and has not been taking it explaining his high blood pressure and pulse rate. Switching to [employer]

## 2015-06-28 NOTE — Assessment & Plan Note (Signed)
Does well with 20 mg of Cialis.

## 2015-07-19 MED FILL — CYCLOBENZAPRINE 10 MG TAB: 10 | 10 days supply | Qty: 30 | Fill #1

## 2015-07-19 MED FILL — LISINOPRIL-HCTZ 20-25 MG TA: 20-25 | 30 days supply | Qty: 30 | Fill #1

## 2015-08-10 DIAGNOSIS — M5136 Other intervertebral disc degeneration, lumbar region: Secondary | ICD-10-CM | POA: Diagnosis not present

## 2015-08-25 MED FILL — CYCLOBENZAPRINE 10 MG TAB: 10 | 10 days supply | Qty: 30 | Fill #2

## 2015-08-25 MED FILL — LISINOPRIL-HCTZ 20-25 MG TA: 20-25 | 30 days supply | Qty: 30 | Fill #2

## 2015-09-08 MED FILL — CIALIS 20 MG TABLET: 20 | 30 days supply | Qty: 6 | Fill #1

## 2015-09-23 MED FILL — LISINOPRIL-HCTZ 20-25 MG TA: 20-25 | 30 days supply | Qty: 30 | Fill #3

## 2015-09-23 MED FILL — CYCLOBENZAPRINE 10 MG TAB: 10 | 90 days supply | Qty: 90 | Fill #0

## 2015-09-23 MED FILL — BYSTOLIC 10 MG TABLET: 10 | 90 days supply | Qty: 90 | Fill #1

## 2015-10-05 MED FILL — CIALIS 20 MG TABLET: 20 | 30 days supply | Qty: 6 | Fill #2

## 2015-10-12 MED FILL — CELECOXIB 200 MG CAPSULE: 200 | 90 days supply | Qty: 180 | Fill #1

## 2015-11-01 MED FILL — LISINOPRIL-HCTZ 20-25 MG TA: 20-25 | 30 days supply | Qty: 30 | Fill #4

## 2015-11-15 MED FILL — CIALIS 20 MG TABLET: 20 | 30 days supply | Qty: 6 | Fill #3

## 2015-11-29 MED FILL — LISINOPRIL-HCTZ 20-25 MG TA: 20-25 | 30 days supply | Qty: 30 | Fill #5

## 2015-12-20 MED FILL — CYCLOBENZAPRINE 10 MG TAB: 10 | 90 days supply | Qty: 90 | Fill #1

## 2015-12-31 MED FILL — LISINOPRIL-HCTZ 20-25 MG TA: 20-25 | 90 days supply | Qty: 90 | Fill #0

## 2015-12-31 MED FILL — CIALIS 20 MG TABLET: 20 | 30 days supply | Qty: 6 | Fill #4

## 2016-01-03 HISTORY — PX: BACK SURGERY: SHX140

## 2016-01-05 MED FILL — BYSTOLIC 10 MG TABLET: 10 | 90 days supply | Qty: 90 | Fill #2

## 2016-01-21 MED FILL — CELECOXIB 200 MG CAPSULE: 200 | 90 days supply | Qty: 180 | Fill #2

## 2016-02-07 MED FILL — AMOXICILLIN 500 MG CAPSULE: 500 | 7 days supply | Qty: 25 | Fill #0

## 2016-02-08 MED FILL — HYDROCODON-APAP 5-325: 5-325 | 3 days supply | Qty: 14 | Fill #0

## 2016-02-15 MED FILL — CIALIS 20 MG TABLET: 20 | 30 days supply | Qty: 6 | Fill #5

## 2016-02-15 MED FILL — AMOXICILLIN 500 MG CAPSULE: 500 | 5 days supply | Qty: 20 | Fill #0

## 2016-02-17 ENCOUNTER — Other Ambulatory Visit: Payer: Self-pay | Admitting: Sports Medicine

## 2016-02-17 MED ORDER — SILDENAFIL CITRATE 20 MG PO TABS: 40.0000 mg | ORAL_TABLET | ORAL | 11 refills | Status: DC | PRN

## 2016-02-18 ENCOUNTER — Other Ambulatory Visit: Payer: Self-pay | Admitting: Sports Medicine

## 2016-02-18 MED ORDER — SILDENAFIL CITRATE 100 MG PO TABS: 100.0000 mg | ORAL_TABLET | ORAL | 11 refills | Status: DC | PRN

## 2016-03-07 MED FILL — CYCLOBENZAPRINE 10 MG TAB: 10 | 90 days supply | Qty: 90 | Fill #2

## 2016-03-10 MED FILL — SILDENAFIL 100 MG TABLET: 100 | 30 days supply | Qty: 6 | Fill #0

## 2016-04-06 MED FILL — BYSTOLIC 10 MG TABLET: 10 | 90 days supply | Qty: 90 | Fill #3

## 2016-04-06 MED FILL — LISINOPRIL-HCTZ 20-25 MG TA: 20-25 | 90 days supply | Qty: 90 | Fill #1

## 2016-04-24 MED FILL — CELECOXIB 200 MG CAPSULE: 200 | 90 days supply | Qty: 180 | Fill #3

## 2016-04-24 MED FILL — SILDENAFIL 100 MG TABLET: 100 | 30 days supply | Qty: 6 | Fill #1

## 2016-05-01 DIAGNOSIS — Z6839 Body mass index (BMI) 39.0-39.9, adult: Secondary | ICD-10-CM | POA: Diagnosis not present

## 2016-05-01 DIAGNOSIS — M419 Scoliosis, unspecified: Secondary | ICD-10-CM | POA: Diagnosis not present

## 2016-05-01 DIAGNOSIS — M4316 Spondylolisthesis, lumbar region: Secondary | ICD-10-CM | POA: Diagnosis not present

## 2016-05-01 DIAGNOSIS — R030 Elevated blood-pressure reading, without diagnosis of hypertension: Secondary | ICD-10-CM | POA: Diagnosis not present

## 2016-05-01 DIAGNOSIS — M5416 Radiculopathy, lumbar region: Secondary | ICD-10-CM | POA: Diagnosis not present

## 2016-05-04 ENCOUNTER — Other Ambulatory Visit (HOSPITAL_COMMUNITY): Payer: Self-pay | Admitting: Neurosurgery

## 2016-05-04 DIAGNOSIS — M4316 Spondylolisthesis, lumbar region: Secondary | ICD-10-CM

## 2016-05-05 ENCOUNTER — Ambulatory Visit: Payer: 59 | Admitting: Sports Medicine

## 2016-05-08 ENCOUNTER — Ambulatory Visit: Payer: 59 | Admitting: Sports Medicine

## 2016-05-09 ENCOUNTER — Other Ambulatory Visit: Payer: Self-pay | Admitting: Neurosurgery

## 2016-05-10 ENCOUNTER — Telehealth: Payer: Self-pay | Admitting: Vascular Surgery

## 2016-05-10 NOTE — Telephone Encounter (Signed)
-----   Message from Gregery Na, RN sent at 05/09/2016 12:49 PM EDT ----- Regarding: OV with TFE Ms. Hauk is scheduled for an ALIF L5-S1 with Drs. Early [name] on 06/23/16. Please schedule patient an office visit with Dr. Donnetta Hutching at least two weeks prior to surgery. Also, please remind patient to bring LS spine films to appt.  Thanks, Colletta Maryland

## 2016-05-10 NOTE — Telephone Encounter (Signed)
LVM on home #, mailed New Pt packet and advised we need copy of LS films for 06/06/16

## 2016-05-11 ENCOUNTER — Ambulatory Visit (INDEPENDENT_AMBULATORY_CARE_PROVIDER_SITE_OTHER): Payer: 59 | Admitting: Sports Medicine

## 2016-05-11 DIAGNOSIS — F411 Generalized anxiety disorder: Secondary | ICD-10-CM | POA: Diagnosis not present

## 2016-05-11 MED ORDER — CYCLOBENZAPRINE HCL 10 MG PO TABS: 10.0000 mg | ORAL_TABLET | Freq: Three times a day (TID) | ORAL | 3 refills | Status: DC | PRN

## 2016-05-11 MED ORDER — DULOXETINE HCL 30 MG PO CPEP: 30.0000 mg | ORAL_CAPSULE | Freq: Every day | ORAL | 3 refills | Status: DC

## 2016-05-11 MED FILL — DULoxetine HCL 30 MG CPEP: 30 | 30 days supply | Qty: 30 | Fill #0

## 2016-05-11 MED FILL — CYCLOBENZAPRINE 10 MG TAB: 10 | 30 days supply | Qty: 90 | Fill #0

## 2016-05-11 NOTE — Progress Notes (Signed)
  Subjective:    CC: Anxiety  HPI: This is a pleasant 56 year old male, he's been through a lot, multiple back injections, surgery, hip arthroplasty. Unfortunately with his scoliosis, degenerative, he is looking at a T12 to pelvic fusion type procedure. Because of this he has had great anxiety, irritability, to the point of tearfulness. He also has significant pain not surprisingly.   Past medical history:  Negative.  See flowsheet/record as well for more information.  Surgical history: Negative.  See flowsheet/record as well for more information.  Family history: Negative.  See flowsheet/record as well for more information.  Social history: Negative.  See flowsheet/record as well for more information.  Allergies, and medications have been entered into the medical record, reviewed, and no changes needed.   Review of Systems: No fevers, chills, night sweats, weight loss, chest pain, or shortness of breath.   Objective:    General: Well Developed, well nourished, and in no acute distress.  Neuro: Alert and oriented x3, extra-ocular muscles intact, sensation grossly intact.  HEENT: Normocephalic, atraumatic, pupils equal round reactive to light, neck supple, no masses, no lymphadenopathy, thyroid nonpalpable.  Skin: Warm and dry, no rashes. Cardiac: Regular rate and rhythm, no murmurs rubs or gallops, no lower extremity edema.  Respiratory: Clear to auscultation bilaterally. Not using accessory muscles, speaking in full sentences.  Impression and Recommendations:    Anxiety, generalized  Significant anxiety with upcoming major back surgery not surprisingly. Also in significant pain, starting Cymbalta 30 and adding Flexeril, return to see me in one month for another GAD7 into evaluate symptoms.  I spent 25 minutes with this patient, greater than 50% was face-to-face time counseling regarding the above diagnoses

## 2016-05-11 NOTE — Assessment & Plan Note (Signed)
Significant anxiety with upcoming major back surgery not surprisingly. Also in significant pain, starting Cymbalta 30 and adding Flexeril, return to see me in one month for another GAD7 into evaluate symptoms.

## 2016-05-13 ENCOUNTER — Ambulatory Visit (HOSPITAL_BASED_OUTPATIENT_CLINIC_OR_DEPARTMENT_OTHER)
Admission: RE | Admit: 2016-05-13 | Discharge: 2016-05-13 | Disposition: A | Discharge status: Home / Self Care | Payer: 59 | Source: Ambulatory Visit | Attending: Neurosurgery | Admitting: Neurosurgery

## 2016-05-13 DIAGNOSIS — M1288 Other specific arthropathies, not elsewhere classified, other specified site: Secondary | ICD-10-CM | POA: Diagnosis not present

## 2016-05-13 DIAGNOSIS — M48061 Spinal stenosis, lumbar region without neurogenic claudication: Secondary | ICD-10-CM | POA: Diagnosis not present

## 2016-05-13 DIAGNOSIS — M4186 Other forms of scoliosis, lumbar region: Secondary | ICD-10-CM | POA: Diagnosis not present

## 2016-05-13 DIAGNOSIS — M47816 Spondylosis without myelopathy or radiculopathy, lumbar region: Secondary | ICD-10-CM | POA: Insufficient documentation

## 2016-05-13 DIAGNOSIS — M4316 Spondylolisthesis, lumbar region: Secondary | ICD-10-CM | POA: Diagnosis not present

## 2016-05-13 DIAGNOSIS — M4306 Spondylolysis, lumbar region: Secondary | ICD-10-CM | POA: Insufficient documentation

## 2016-05-13 DIAGNOSIS — M545 Low back pain: Secondary | ICD-10-CM | POA: Diagnosis not present

## 2016-05-15 ENCOUNTER — Ambulatory Visit (HOSPITAL_COMMUNITY): Payer: 59

## 2016-05-24 ENCOUNTER — Encounter: Payer: Self-pay | Admitting: Vascular Surgery

## 2016-05-24 DIAGNOSIS — R030 Elevated blood-pressure reading, without diagnosis of hypertension: Secondary | ICD-10-CM | POA: Diagnosis not present

## 2016-05-24 DIAGNOSIS — M48062 Spinal stenosis, lumbar region with neurogenic claudication: Secondary | ICD-10-CM | POA: Diagnosis not present

## 2016-05-24 DIAGNOSIS — M5416 Radiculopathy, lumbar region: Secondary | ICD-10-CM | POA: Diagnosis not present

## 2016-05-24 DIAGNOSIS — Z6838 Body mass index (BMI) 38.0-38.9, adult: Secondary | ICD-10-CM | POA: Diagnosis not present

## 2016-05-24 DIAGNOSIS — M419 Scoliosis, unspecified: Secondary | ICD-10-CM | POA: Diagnosis not present

## 2016-05-25 MED FILL — OXYCODONE/APAP 5/325 MG TAB: 5-325 | 5 days supply | Qty: 60 | Fill #0

## 2016-05-31 ENCOUNTER — Other Ambulatory Visit: Payer: Self-pay

## 2016-06-02 DIAGNOSIS — I8290 Acute embolism and thrombosis of unspecified vein: Secondary | ICD-10-CM

## 2016-06-02 HISTORY — DX: Acute embolism and thrombosis of unspecified vein: I82.90

## 2016-06-05 ENCOUNTER — Other Ambulatory Visit: Payer: Self-pay

## 2016-06-05 NOTE — Patient Outreach (Signed)
[facility]) Care Management  06/05/2016  KACETON VIEAU 1960-11-09 [id number]   Subjective: none.  Objective: Per chart review, patient with Scoliosis is scheduled for L5-S1 anterior lumbar interbody fusion on 06/23/16 and a T10 to Pelvis Fixation on 6/25. History of anxiety, HTN, spinal stenosis.  Assessment:  Received [facility] Pre-surgical call referral on 05/24/16. Telephone call to patient's home number, no answer. HIPPA compliant voice message left. Pre-surgical call pending patient contact.   Plan: RNCM will call patient for 2nd telephone outreach attempt within the week if no return call.  Thea Silversmith, RN, MSN, [place] Coordinator Cell: [phone]

## 2016-06-06 ENCOUNTER — Ambulatory Visit (INDEPENDENT_AMBULATORY_CARE_PROVIDER_SITE_OTHER): Payer: 59 | Admitting: Vascular Surgery

## 2016-06-06 ENCOUNTER — Encounter: Payer: Self-pay | Admitting: Vascular Surgery

## 2016-06-06 VITALS — BP 133/87 | HR 76 | Temp 98.8°F | Resp 20 | Ht 71.0 in | Wt 284.2 lb

## 2016-06-06 DIAGNOSIS — M4316 Spondylolisthesis, lumbar region: Secondary | ICD-10-CM | POA: Diagnosis not present

## 2016-06-06 NOTE — Progress Notes (Signed)
[facility]ist [facility] [place]  Patient name: Travis Huff MRN: [id number] DOB: November 01, 1960 Sex: male  REASON FOR CONSULT: evaluate prior to ALIF; requesting physician Dr. [name]  HPI: ALEXY HELDT is a 56 y.o. male, who presents for evaluation prior to ALIF at L5-S1 with Dr. [name] on 06/23/16. He will also undergo XLIF at that time. The patient reports a long history of back pain in addition to pain in his legs. He also reports that his legs and feet become numb when he drives. He has failed physical therapy and injection therapy.   He has a prior ventral hernia repair by Dr. Hulen Skains. He underwent right total hip arthroplasty in 2016. He reports having spider veins develop recently. His legs also cramp after standing and walking at work all day. He works in [employer]. He unsure of a family history of vein issues as he is adopted.   Other medical problems include hypertension that is well controlled and obesity.   Past Medical History:  Diagnosis Date  . Arthritis   . Asthma   . Hypertension   . Viral pericarditis     Family History  Problem Relation Age of Onset  . Adopted: Yes    SOCIAL HISTORY: Social History   Social History  . Marital status: Married    Spouse name: N/A  . Number of children: N/A  . Years of education: N/A   Occupational History  . Not on file.   Social History Main Topics  . Smoking status: Former Research scientist (life sciences)  . Smokeless tobacco: Current User    Types: Snuff  . Alcohol use 8.4 oz/week    14 Cans of beer per week     Comment: 2 beers QD  . Drug use: No  . Sexual activity: Not on file   Other Topics Concern  . Not on file   Social History Narrative  . No narrative on file    Allergies  Allergen Reactions  . Dilaudid [Hydromorphone Hcl] Itching and Other (See Comments)    Couldn't sleep  . Morphine And Related Itching    MEDICATIONS:  Current Outpatient Prescriptions  Medication Sig Dispense Refill  .  celecoxib (CELEBREX) 200 MG capsule TAKE 1 TO 2 CAPSULES BY MOUTH DAILY AS NEEDED FOR PAIN. 180 capsule 3  . cyclobenzaprine (FLEXERIL) 10 MG tablet Take 1 tablet (10 mg total) by mouth 3 (three) times daily as needed for muscle spasms. 90 tablet 3  . docusate sodium (COLACE) 100 MG capsule Take 1 capsule (100 mg total) by mouth 2 (two) times daily. 60 capsule 0  . DULoxetine (CYMBALTA) 30 MG capsule Take 1 capsule (30 mg total) by mouth daily. 30 capsule 3  . lisinopril-hydrochlorothiazide (PRINZIDE,ZESTORETIC) 20-25 MG tablet Take 1 tablet by mouth daily. 90 tablet 3  . nebivolol (BYSTOLIC) 10 MG tablet Take 1 tablet (10 mg total) by mouth daily. 90 tablet 3  . sildenafil (VIAGRA) 100 MG tablet Take 1 tablet (100 mg total) by mouth as needed for erectile dysfunction. 10 tablet 11   No current facility-administered medications for this visit.     REVIEW OF SYSTEMS:  [X]  denotes positive finding, [ ]  denotes negative finding Cardiac  Comments:  Chest pain or chest pressure:    Shortness of breath upon exertion:    Short of breath when lying flat:    Irregular heart rhythm:        Vascular    Pain in calf, thigh, or hip brought  on by ambulation:    Pain in feet at night that wakes you up from your sleep:     Blood clot in your veins:    Leg swelling:         Pulmonary    Oxygen at home:    Productive cough:     Wheezing:         Neurologic    Sudden weakness in arms or legs:     Sudden numbness in arms or legs:     Sudden onset of difficulty speaking or slurred speech:    Temporary loss of vision in one eye:     Problems with dizziness:         Gastrointestinal    Blood in stool:     Vomited blood:         Genitourinary    Burning when urinating:     Blood in urine:        Psychiatric    Major depression:         Hematologic    Bleeding problems:    Problems with blood clotting too easily:        Skin    Rashes or ulcers:        Constitutional    Fever or  chills:      PHYSICAL EXAM: Vitals:   06/06/16 1449  BP: 133/87  Pulse: 76  Resp: 20  Temp: 98.8 F (37.1 C)  TempSrc: Oral  SpO2: 96%  Weight: 284 lb 3.2 oz (128.9 kg)  Height: 5\' 11"  (1.803 m)    GENERAL: The patient is a well-nourished male, in no acute distress. The vital signs are documented above. HEENT: normocephalic, atraumatic, no abnormalities noted.  CARDIAC: There is a regular rate and rhythm.  VASCULAR: 2+ DP pulses bilaterally. Saphenous veins evaluated on sonosite without. Normal in size without dilation.  PULMONARY: There is good air exchange bilaterally without wheezing or rales. ABDOMEN: Obese abdomen.  MUSCULOSKELETAL: There are no major deformities or cyanosis. NEUROLOGIC: No focal weakness or paresthesias are detected. SKIN: Spider telangiectasias of lower extremities bilaterally.  PSYCHIATRIC: The patient has a normal affect.   MEDICAL ISSUES: Spondylolisthesis and radiculopathy  The patient appears to be a good candidate for anterior retroperitoneal exposure of L5-S1. He has prior history of umbilical hernia repair but we will be below the level of his mesh. His obesity will make anterior exposure a bit challenging however. Discussed the role of vascular surgery in exposure of the spine. He is currently scheduled for L5-S1 ALIF on 06/23/16. Dr. Donnetta Hutching discussed the potential risks associated with surgery including bleeding and potential blood vessel injury.   Virgina Jock, PA-C [facility] [place]   I have examined the patient, reviewed and agree with above. Discussed with the patient and wife present. I explained my role for mobilization of the rectus muscle and mobilization of the peritoneal sac and contents to the right. Also mobilization of the left ureter and arterial venous structures overlying the spine. Also discussed potential risk for all of these. They understand wish to proceed with surgery as scheduled on  622  Curt Jews, MD 06/06/2016 4:27 PM   .

## 2016-06-09 ENCOUNTER — Encounter (HOSPITAL_COMMUNITY): Payer: Self-pay

## 2016-06-09 ENCOUNTER — Encounter (HOSPITAL_COMMUNITY)
Admission: RE | Admit: 2016-06-09 | Discharge: 2016-06-09 | Disposition: A | Discharge status: Home / Self Care | Payer: 59 | Source: Ambulatory Visit | Attending: Neurosurgery | Admitting: Neurosurgery

## 2016-06-09 ENCOUNTER — Ambulatory Visit: Payer: 59 | Admitting: Sports Medicine

## 2016-06-09 DIAGNOSIS — Z01818 Encounter for other preprocedural examination: Secondary | ICD-10-CM | POA: Diagnosis present

## 2016-06-09 DIAGNOSIS — M4316 Spondylolisthesis, lumbar region: Secondary | ICD-10-CM | POA: Insufficient documentation

## 2016-06-09 DIAGNOSIS — R001 Bradycardia, unspecified: Secondary | ICD-10-CM | POA: Diagnosis not present

## 2016-06-09 DIAGNOSIS — M419 Scoliosis, unspecified: Secondary | ICD-10-CM | POA: Insufficient documentation

## 2016-06-09 DIAGNOSIS — Z01810 Encounter for preprocedural cardiovascular examination: Secondary | ICD-10-CM | POA: Insufficient documentation

## 2016-06-09 HISTORY — DX: Gastro-esophageal reflux disease without esophagitis: K21.9

## 2016-06-09 LAB — SURGICAL PCR SCREEN
MRSA, PCR: NEGATIVE
Staphylococcus aureus: POSITIVE — AB

## 2016-06-09 LAB — CBC
HCT: 49.6 % (ref 39.0–52.0)
Hemoglobin: 16.3 g/dL (ref 13.0–17.0)
MCH: 31.7 pg (ref 26.0–34.0)
MCHC: 32.9 g/dL (ref 30.0–36.0)
MCV: 96.3 fL (ref 78.0–100.0)
Platelets: 192 10*3/uL (ref 150–400)
RBC: 5.15 MIL/uL (ref 4.22–5.81)
RDW: 13.6 % (ref 11.5–15.5)
WBC: 7.2 10*3/uL (ref 4.0–10.5)

## 2016-06-09 LAB — TYPE AND SCREEN
ABO/RH(D): O NEG
Antibody Screen: NEGATIVE

## 2016-06-09 LAB — BASIC METABOLIC PANEL
Anion gap: 10 (ref 5–15)
BUN: 15 mg/dL (ref 6–20)
CO2: 26 mmol/L (ref 22–32)
Calcium: 9.1 mg/dL (ref 8.9–10.3)
Chloride: 101 mmol/L (ref 101–111)
Creatinine, Ser: 0.89 mg/dL (ref 0.61–1.24)
GFR calc Af Amer: >60 mL/min (ref >60)
GFR calc non Af Amer: >60 mL/min (ref >60)
Glucose, Bld: 104 mg/dL — ABNORMAL HIGH (ref 65–99)
Potassium: 3.7 mmol/L (ref 3.5–5.1)
Sodium: 137 mmol/L (ref 135–145)

## 2016-06-09 NOTE — Progress Notes (Signed)
I called a prescription for Mupirocin ointment to [place],

## 2016-06-09 NOTE — Pre-Procedure Instructions (Signed)
    JEREMIAH CURCI  06/09/2016      [address] [place] [address] Phone: [phone] Fax: [phone]    Your procedure is scheduled on 06-23-2016 Friday   Report to [facility] Admitting at 5:30  A.M.   Call this number if you have problems the morning of surgery:  [phone]   Remember:  Do not eat food or drink liquids after midnight.   Take these medicines the morning of surgery with A SIP OF WATER if needed,colace(Flexeril) (Colace)(Bystolic),pain medication if needed     STOP ASPIRIN,ANTIINFLAMATORIES (IBUPROFEN,ALEVE,MOTRIN,ADVIL,GOODY'S POWDERS),HERBAL SUPPLEMENTS,FISH OIL,AND VITAMINS 5-7 DAYS PRIOR TO SURGERY     Do not wear jewelry, make-up or nail polish.  Do not wear lotions, powders, or perfumes, or deoderant.  Do not shave 48 hours prior to surgery.  Men may shave face and neck.  Do not bring valuables to the hospital.  [facility] is not responsible for any belongings or valuables.  Contacts, dentures or bridgework may not be worn into surgery.  Leave your suitcase in the car.  After surgery it may be brought to your room.  For patients admitted to the hospital, discharge time will be determined by your treatment team.    Please read over the following fact sheets that you were given. MRSA Information and Surgical Site Infection Prevention

## 2016-06-12 ENCOUNTER — Encounter: Payer: Self-pay | Admitting: Sports Medicine

## 2016-06-12 ENCOUNTER — Ambulatory Visit (INDEPENDENT_AMBULATORY_CARE_PROVIDER_SITE_OTHER): Payer: 59 | Admitting: Sports Medicine

## 2016-06-12 DIAGNOSIS — F411 Generalized anxiety disorder: Secondary | ICD-10-CM | POA: Diagnosis not present

## 2016-06-12 MED ORDER — DULOXETINE HCL 60 MG PO CPEP: 60.0000 mg | ORAL_CAPSULE | Freq: Every day | ORAL | 3 refills | Status: DC

## 2016-06-12 MED FILL — MUPIROCIN 2% OINTMENT: 2 | 5 days supply | Qty: 22 | Fill #0

## 2016-06-12 MED FILL — DULoxetine HCL 60 MG CPEP: 60 | 30 days supply | Qty: 30 | Fill #0

## 2016-06-12 NOTE — Assessment & Plan Note (Addendum)
Improved significantly on 30 of Cymbalta, increasing to 60. Has also noted mild improvement in his pain. I'm going to take him out of work. This is anticipation of his back surgery.

## 2016-06-12 NOTE — Progress Notes (Signed)
  Subjective:    CC: Follow-up   HPI: Generalized anxiety: Improved considerably with Cymbalta, also noted mild improvement in his pain. Agreeable to go up on the dose.  No suicidal or homicidal ideation.   Past medical history:  Negative.  See flowsheet/record as well for more information.  Surgical history: Negative.  See flowsheet/record as well for more information.  Family history: Negative.  See flowsheet/record as well for more information.  Social history: Negative.  See flowsheet/record as well for more information.  Allergies, and medications have been entered into the medical record, reviewed, and no changes needed.   Review of Systems: No fevers, chills, night sweats, weight loss, chest pain, or shortness of breath.   Objective:    General: Well Developed, well nourished, and in no acute distress.  Neuro: Alert and oriented x3, extra-ocular muscles intact, sensation grossly intact.  HEENT: Normocephalic, atraumatic, pupils equal round reactive to light, neck supple, no masses, no lymphadenopathy, thyroid nonpalpable.  Skin: Warm and dry, no rashes. Cardiac: Regular rate and rhythm, no murmurs rubs or gallops, no lower extremity edema.  Respiratory: Clear to auscultation bilaterally. Not using accessory muscles, speaking in full sentences.  Impression and Recommendations:    Anxiety, generalized Improved significantly on 30 of Cymbalta, increasing to 60. Has also noted mild improvement in his pain. I'm going to take him out of work. This is anticipation of his back surgery.  I spent 25 minutes with this patient, greater than 50% was face-to-face time counseling regarding the above diagnoses

## 2016-06-12 NOTE — Progress Notes (Signed)
Anesthesia Chart Review:  Pt is a 56 year old male scheduled for L5-S1 anterior lumbar interbody fusion, right L1-2, L 2-3, L3-4, L4-5 anterolateral lumbar interbody fusion, abdominal exposure on 06/23/2016 with Erline Levine, MD and Curt Jews, M.D.  [facility] includes:  HTN, viral pericarditis, asthma, GERD. Former smoker. BMI 40. S/p R THA 05/25/14.   Medications include: Lisinopril-HCTZ, nebivolol, potassium, sildenafil  Preoperative labs reviewed.  EKG 06/09/16: Sinus bradycardia (58 bpm). Low voltage QRS. Cannot rule out Inferior infarct, age undetermined. Appears stable when compared to EKG 05/23/12.   Echo 05/23/12:  - Left ventricle: The cavity size was normal. There was mild concentric hypertrophy. Systolic function was normal. The estimated ejection fraction was in the range of 60% to 65%. Wall motion was normal; there were no regional wall motion abnormalities. Features are consistent with a pseudonormal left ventricular filling pattern, with concomitant abnormal relaxation and increased filling pressure (grade 2 diastolic dysfunction).  Nuclear stress test 05/23/12:  1. Negative for excercise-stress induced ischemia. 2. Left ventricular ejection fraction 63%.  If no changes, I anticipate pt can proceed with surgery as scheduled.   Willeen Cass, FNP-BC [facility]/Anesthesiology Phone: [phone] 06/12/2016 12:17 PM

## 2016-06-13 ENCOUNTER — Other Ambulatory Visit: Payer: Self-pay

## 2016-06-13 MED FILL — SILDENAFIL 100 MG TABLET: 100 | 30 days supply | Qty: 6 | Fill #2

## 2016-06-13 NOTE — Patient Outreach (Signed)
[place] [facility]) Care Management  06/13/2016  Travis Huff 04-26-1960 [id number]   Subjective: none.  Objective: Per chart review, patient with Scoliosis is scheduled for L5-S1 anterior lumbar interbody fusion on 06/23/16 and a T10 to Pelvis Fixation on 6/25. History of anxiety, HTN, spinal stenosis.  Assessment:  Received [facility] Pre-surgical call referral on 05/24/16. Telephone call to patient's home number, no answer. HIPPA compliant voice message left. Pre-surgical call pending patient contact.   Plan: RNCM will continue to try to reach patient.   Thea Silversmith, RN, MSN, [place] Coordinator Cell: [phone]

## 2016-06-14 ENCOUNTER — Other Ambulatory Visit: Payer: Self-pay

## 2016-06-14 NOTE — Patient Outreach (Signed)
[place] [facility]) Care Management  06/14/2016  Travis Huff 10-23-1960 [id number]  Subjective:: Telephone call to patient. Discussed [facility] pre-op follow up. Patient voices understanding and agrees to pre-op call.    Objective:Per chart review, patient withScoliosis is scheduled forL5-S1 anterior lumbar interbody fusionon 6/22/18and a T10 to Pelvis Fixation on 6/25. History ofanxiety, HTN, spinal stenosis.  Assessment: Received [facility] Pre-surgical call referral on 05/24/16. Travis Huff reports that his wife has completed FMLA forms. RNCM reinforced it is a good idea to keep a copy of the completed forms. Hospital Indemnity plan discussed. He states he will ask his wife is she chose this as part of the benefit package. He also states he will be getting short-term disability through his work-place.   Post procedure equipment/home health-Travis Huff denies equipment or home health needs. He reports he has a walker, shower stool and cane. RNCM reinforced the inpatient case manager in the hospital will arrange if he has any of these needs prior to discharge.   RNCM discussed [place] benefit is higher when using a [place] facility/pharmacy. RNCM reinforced that if he is discharged home on the weekend with a new prescription, he will have to use a local pharmacy due to [facility] pharmacies are not open on the weekend.     Support:  Client reports that he has someone to assist with pharmacy needs at discharge, either his wife or daughter. Client reports he has transportation to his follow up appointment.     Discussed Advanced Directives. RNCM reinforced that Spiritual care department is available to assist [employer] employees with Advanced Directives as needed.  No other medical issues identified and no additional community resource information needs at this time.   Patient is agreeable to follow up post procedure call.  Plan: telephonic RNCM will follow up post discharge  within 3 business days of notification.  Thea Silversmith, RN, MSN, [place] Coordinator Cell: [phone]

## 2016-06-16 ENCOUNTER — Other Ambulatory Visit (HOSPITAL_COMMUNITY): Payer: 59

## 2016-06-19 ENCOUNTER — Telehealth: Payer: Self-pay | Admitting: Sports Medicine

## 2016-06-19 DIAGNOSIS — F411 Generalized anxiety disorder: Secondary | ICD-10-CM

## 2016-06-19 MED ORDER — DULOXETINE HCL 40 MG PO CPEP: 40.0000 mg | ORAL_CAPSULE | Freq: Every day | ORAL | 3 refills | Status: DC

## 2016-06-19 NOTE — Telephone Encounter (Signed)
The increase may have been too rapid, I called in 40 mg tabs, he should try this for a couple weeks and then bump back up to 60.

## 2016-06-19 NOTE — Telephone Encounter (Signed)
Pt called clinic stating his Cymbalta was increased from 30mg  to 60mg . The increase has helped with the back pain, but he has been having headaches. Pt has been taking bp at home, it has been in normal range. Questions if he can step back down or stop rx. Routing to PCP for review.

## 2016-06-20 ENCOUNTER — Ambulatory Visit (INDEPENDENT_AMBULATORY_CARE_PROVIDER_SITE_OTHER): Payer: 59 | Admitting: Sports Medicine

## 2016-06-20 ENCOUNTER — Ambulatory Visit (HOSPITAL_COMMUNITY)
Admission: RE | Admit: 2016-06-20 | Discharge: 2016-06-20 | Disposition: A | Discharge status: Home / Self Care | Payer: 59 | Source: Ambulatory Visit | Attending: Sports Medicine | Admitting: Sports Medicine

## 2016-06-20 ENCOUNTER — Encounter: Payer: Self-pay | Admitting: Sports Medicine

## 2016-06-20 DIAGNOSIS — Z87891 Personal history of nicotine dependence: Secondary | ICD-10-CM | POA: Diagnosis not present

## 2016-06-20 DIAGNOSIS — R51 Headache: Secondary | ICD-10-CM

## 2016-06-20 DIAGNOSIS — K219 Gastro-esophageal reflux disease without esophagitis: Secondary | ICD-10-CM | POA: Diagnosis not present

## 2016-06-20 DIAGNOSIS — F411 Generalized anxiety disorder: Secondary | ICD-10-CM | POA: Diagnosis not present

## 2016-06-20 DIAGNOSIS — M4316 Spondylolisthesis, lumbar region: Secondary | ICD-10-CM | POA: Diagnosis not present

## 2016-06-20 DIAGNOSIS — G319 Degenerative disease of nervous system, unspecified: Secondary | ICD-10-CM

## 2016-06-20 DIAGNOSIS — D62 Acute posthemorrhagic anemia: Secondary | ICD-10-CM | POA: Diagnosis not present

## 2016-06-20 DIAGNOSIS — Z79899 Other long term (current) drug therapy: Secondary | ICD-10-CM | POA: Diagnosis not present

## 2016-06-20 DIAGNOSIS — M5116 Intervertebral disc disorders with radiculopathy, lumbar region: Secondary | ICD-10-CM | POA: Diagnosis not present

## 2016-06-20 DIAGNOSIS — R519 Headache, unspecified: Secondary | ICD-10-CM | POA: Insufficient documentation

## 2016-06-20 DIAGNOSIS — M48062 Spinal stenosis, lumbar region with neurogenic claudication: Secondary | ICD-10-CM | POA: Diagnosis not present

## 2016-06-20 DIAGNOSIS — R200 Anesthesia of skin: Secondary | ICD-10-CM | POA: Diagnosis not present

## 2016-06-20 DIAGNOSIS — M419 Scoliosis, unspecified: Secondary | ICD-10-CM | POA: Diagnosis not present

## 2016-06-20 MED ORDER — GADOBENATE DIMEGLUMINE 529 MG/ML IV SOLN
20.0000 mL | Freq: Once | INTRAVENOUS | Status: AC | PRN
  Administered 2016-06-20: 20 mL via INTRAVENOUS

## 2016-06-20 MED FILL — DULoxetine HCL 20 MG CPEP: 20 | 30 days supply | Qty: 60 | Fill #0

## 2016-06-20 NOTE — Assessment & Plan Note (Signed)
Intractable headaches, has never had brain imaging. Renal function was normal 2 weeks ago. Adding a brain MRI with and without IV contrast.

## 2016-06-20 NOTE — Progress Notes (Signed)
  Subjective:    CC: Follow-up  HPI: Sherard returns, we placed him on Cymbalta 30 mg for his anxiety as well as back pain, his anxiety improved considerably as did his back pain, he still had some back pain remaining and so we bumped him up to 60 mg. Unfortunately he developed severe headaches. On further questioning he's had these headaches prior, and has never had advanced imaging, headaches are bitemporal, throbbing without radiation, no photophobia, no phonophobia, he does have occasional shaking episodes. He's continued to take the 60 mg Cymbalta, and feels as though the headaches worsened significantly with the increase in dosage.  Past medical history:  Negative.  See flowsheet/record as well for more information.  Surgical history: Negative.  See flowsheet/record as well for more information.  Family history: Negative.  See flowsheet/record as well for more information.  Social history: Negative.  See flowsheet/record as well for more information.  Allergies, and medications have been entered into the medical record, reviewed, and no changes needed.   Review of Systems: No fevers, chills, night sweats, weight loss, chest pain, or shortness of breath.   Objective:    General: Well Developed, well nourished, and in no acute distress.  Neuro: Alert and oriented x3, extra-ocular muscles intact, sensation grossly intact. Cranial nerves II through XII are intact, motor, sensory, coordinative functions are all intact, he does have a 2-3 Hz right hand tremor. HEENT: Normocephalic, atraumatic, pupils equal round reactive to light, neck supple, no masses, no lymphadenopathy, thyroid nonpalpable.  Skin: Warm and dry, no rashes. Cardiac: Regular rate and rhythm, no murmurs rubs or gallops, no lower extremity edema.  Respiratory: Clear to auscultation bilaterally. Not using accessory muscles, speaking in full sentences.  Impression and Recommendations:    Headache Intractable headaches, has  never had brain imaging. Renal function was normal 2 weeks ago. Adding a brain MRI with and without IV contrast.  Anxiety, generalized Did get some headaches with the increase of Cymbalta from 30-60 mg, dropping to 40 mg. Has not yet started the 40 mg capsules.  I spent 25 minutes with this patient, greater than 50% was face-to-face time counseling regarding the above diagnoses

## 2016-06-20 NOTE — Assessment & Plan Note (Signed)
Did get some headaches with the increase of Cymbalta from 30-60 mg, dropping to 40 mg. Has not yet started the 40 mg capsules.

## 2016-06-20 NOTE — Telephone Encounter (Signed)
Pt advised of new Rx. He already scheduled an appt today for eval, he would like to keep this. No further questions.

## 2016-06-22 MED ORDER — DEXTROSE 5 % IV SOLN
3.0000 g | INTRAVENOUS | Status: AC
  Administered 2016-06-23 (×2): 3 g via INTRAVENOUS
  Filled 2016-06-22: qty 3000

## 2016-06-22 NOTE — Anesthesia Preprocedure Evaluation (Addendum)
Anesthesia Evaluation  Patient identified by MRN, date of birth, ID band Patient awake    Reviewed: Allergy & Precautions, H&P , NPO status , Patient's Chart, lab work & pertinent test results  Airway Mallampati: I  TM Distance: >3 FB Neck ROM: Full    Dental no notable dental hx. (+) Teeth Intact, Dental Advisory Given   Pulmonary asthma , former smoker,    Pulmonary exam normal breath sounds clear to auscultation       Cardiovascular Exercise Tolerance: Good hypertension, Pt. on medications and Pt. on home beta blockers  Rhythm:Regular Rate:Normal     Neuro/Psych  Headaches, negative psych ROS   GI/Hepatic Neg liver ROS, GERD  Controlled,  Endo/Other  negative endocrine ROS  Renal/GU negative Renal ROS  negative genitourinary   Musculoskeletal  (+) Arthritis , Osteoarthritis,    Abdominal   Peds  Hematology negative hematology ROS (+)   Anesthesia Other Findings   Reproductive/Obstetrics negative OB ROS                            Anesthesia Physical Anesthesia Plan  ASA: III  Anesthesia Plan: General   Post-op Pain Management:    Induction: Intravenous  PONV Risk Score and Plan: 3 and Ondansetron, Dexamethasone, Propofol and Midazolam  Airway Management Planned: Oral ETT  Additional Equipment: Arterial line  Intra-op Plan:   Post-operative Plan: Extubation in OR and Possible Post-op intubation/ventilation  Informed Consent: I have reviewed the patients History and Physical, chart, labs and discussed the procedure including the risks, benefits and alternatives for the proposed anesthesia with the patient or authorized representative who has indicated his/her understanding and acceptance.   Dental advisory given  Plan Discussed with: CRNA  Anesthesia Plan Comments:        Anesthesia Quick Evaluation

## 2016-06-23 ENCOUNTER — Encounter (HOSPITAL_COMMUNITY): Payer: Self-pay | Admitting: *Deleted

## 2016-06-23 ENCOUNTER — Inpatient Hospital Stay (HOSPITAL_COMMUNITY)
Admission: RE | Admit: 2016-06-23 | Discharge: 2016-06-29 | DRG: 454 | Disposition: A | Discharge status: Home / Self Care | Payer: 59 | Source: Ambulatory Visit | Attending: Neurosurgery | Admitting: Neurosurgery

## 2016-06-23 ENCOUNTER — Inpatient Hospital Stay (HOSPITAL_COMMUNITY): Payer: 59 | Admitting: Anesthesiology

## 2016-06-23 ENCOUNTER — Inpatient Hospital Stay (HOSPITAL_COMMUNITY): Payer: 59 | Admitting: Emergency Medicine

## 2016-06-23 ENCOUNTER — Inpatient Hospital Stay (HOSPITAL_COMMUNITY): Payer: 59

## 2016-06-23 ENCOUNTER — Inpatient Hospital Stay (HOSPITAL_COMMUNITY)
Admission: RE | Disposition: A | Discharge status: Home / Self Care | Payer: Self-pay | Source: Ambulatory Visit | Attending: Neurosurgery

## 2016-06-23 DIAGNOSIS — M48062 Spinal stenosis, lumbar region with neurogenic claudication: Secondary | ICD-10-CM | POA: Diagnosis present

## 2016-06-23 DIAGNOSIS — I714 Abdominal aortic aneurysm, without rupture: Secondary | ICD-10-CM | POA: Diagnosis not present

## 2016-06-23 DIAGNOSIS — M5137 Other intervertebral disc degeneration, lumbosacral region: Secondary | ICD-10-CM | POA: Diagnosis not present

## 2016-06-23 DIAGNOSIS — M4316 Spondylolisthesis, lumbar region: Secondary | ICD-10-CM | POA: Diagnosis not present

## 2016-06-23 DIAGNOSIS — D62 Acute posthemorrhagic anemia: Secondary | ICD-10-CM | POA: Diagnosis not present

## 2016-06-23 DIAGNOSIS — M419 Scoliosis, unspecified: Secondary | ICD-10-CM | POA: Diagnosis not present

## 2016-06-23 DIAGNOSIS — Z87891 Personal history of nicotine dependence: Secondary | ICD-10-CM | POA: Diagnosis not present

## 2016-06-23 DIAGNOSIS — Z791 Long term (current) use of non-steroidal anti-inflammatories (NSAID): Secondary | ICD-10-CM

## 2016-06-23 DIAGNOSIS — R200 Anesthesia of skin: Secondary | ICD-10-CM | POA: Diagnosis present

## 2016-06-23 DIAGNOSIS — M5116 Intervertebral disc disorders with radiculopathy, lumbar region: Secondary | ICD-10-CM | POA: Diagnosis not present

## 2016-06-23 DIAGNOSIS — M4326 Fusion of spine, lumbar region: Secondary | ICD-10-CM | POA: Diagnosis not present

## 2016-06-23 DIAGNOSIS — R030 Elevated blood-pressure reading, without diagnosis of hypertension: Secondary | ICD-10-CM | POA: Diagnosis present

## 2016-06-23 DIAGNOSIS — Z419 Encounter for procedure for purposes other than remedying health state, unspecified: Secondary | ICD-10-CM

## 2016-06-23 DIAGNOSIS — K219 Gastro-esophageal reflux disease without esophagitis: Secondary | ICD-10-CM | POA: Diagnosis present

## 2016-06-23 DIAGNOSIS — M4317 Spondylolisthesis, lumbosacral region: Secondary | ICD-10-CM | POA: Diagnosis not present

## 2016-06-23 DIAGNOSIS — Z79899 Other long term (current) drug therapy: Secondary | ICD-10-CM

## 2016-06-23 DIAGNOSIS — M4126 Other idiopathic scoliosis, lumbar region: Secondary | ICD-10-CM

## 2016-06-23 DIAGNOSIS — M48061 Spinal stenosis, lumbar region without neurogenic claudication: Secondary | ICD-10-CM | POA: Diagnosis not present

## 2016-06-23 DIAGNOSIS — Z885 Allergy status to narcotic agent status: Secondary | ICD-10-CM

## 2016-06-23 DIAGNOSIS — M47816 Spondylosis without myelopathy or radiculopathy, lumbar region: Secondary | ICD-10-CM | POA: Diagnosis not present

## 2016-06-23 DIAGNOSIS — M5136 Other intervertebral disc degeneration, lumbar region: Secondary | ICD-10-CM | POA: Diagnosis not present

## 2016-06-23 HISTORY — PX: ANTERIOR LATERAL LUMBAR FUSION 4 LEVELS: SHX5552

## 2016-06-23 HISTORY — PX: ABDOMINAL EXPOSURE: SHX5708

## 2016-06-23 HISTORY — PX: ANTERIOR LUMBAR FUSION: SHX1170

## 2016-06-23 LAB — TYPE AND SCREEN
ABO/RH(D): O NEG
Antibody Screen: NEGATIVE

## 2016-06-23 SURGERY — ANTERIOR LUMBAR FUSION 1 LEVEL
Anesthesia: General | Laterality: Right

## 2016-06-23 MED ORDER — DEXAMETHASONE SODIUM PHOSPHATE 10 MG/ML IJ SOLN
INTRAMUSCULAR | Status: DC | PRN
  Administered 2016-06-23: 10 mg via INTRAVENOUS

## 2016-06-23 MED ORDER — PHENOL 1.4 % MT LIQD: 1.0000 | OROMUCOSAL | Status: DC | PRN

## 2016-06-23 MED ORDER — MIDAZOLAM HCL 2 MG/2ML IJ SOLN
INTRAMUSCULAR | Status: AC
  Filled 2016-06-23: qty 2

## 2016-06-23 MED ORDER — HYDROMORPHONE HCL 1 MG/ML IJ SOLN
INTRAMUSCULAR | Status: DC | PRN
  Administered 2016-06-23: 0.5 mg via INTRAVENOUS

## 2016-06-23 MED ORDER — LISINOPRIL 20 MG PO TABS
20.0000 mg | ORAL_TABLET | Freq: Every day | ORAL | Status: DC
  Administered 2016-06-23 – 2016-06-28 (×2): 20 mg via ORAL
  Filled 2016-06-23 (×5): qty 1

## 2016-06-23 MED ORDER — FENTANYL CITRATE (PF) 100 MCG/2ML IJ SOLN
INTRAMUSCULAR | Status: AC
  Administered 2016-06-23: 50 ug
  Filled 2016-06-23: qty 2

## 2016-06-23 MED ORDER — MIDAZOLAM HCL 5 MG/5ML IJ SOLN
INTRAMUSCULAR | Status: DC | PRN
  Administered 2016-06-23: 2 mg via INTRAVENOUS

## 2016-06-23 MED ORDER — ACETAMINOPHEN 325 MG PO TABS: 650.0000 mg | ORAL_TABLET | ORAL | Status: DC | PRN

## 2016-06-23 MED ORDER — METHOCARBAMOL 500 MG PO TABS
500.0000 mg | ORAL_TABLET | Freq: Four times a day (QID) | ORAL | Status: DC | PRN
  Administered 2016-06-24 – 2016-06-27 (×7): 500 mg via ORAL
  Filled 2016-06-23 (×8): qty 1

## 2016-06-23 MED ORDER — CHLORHEXIDINE GLUCONATE CLOTH 2 % EX PADS: 6.0000 | MEDICATED_PAD | Freq: Once | CUTANEOUS | Status: DC

## 2016-06-23 MED ORDER — DEXAMETHASONE SODIUM PHOSPHATE 10 MG/ML IJ SOLN
INTRAMUSCULAR | Status: AC
  Filled 2016-06-23: qty 1

## 2016-06-23 MED ORDER — SUGAMMADEX SODIUM 200 MG/2ML IV SOLN
INTRAVENOUS | Status: AC
  Filled 2016-06-23: qty 2

## 2016-06-23 MED ORDER — ZOLPIDEM TARTRATE 5 MG PO TABS: 5.0000 mg | ORAL_TABLET | Freq: Every evening | ORAL | Status: DC | PRN

## 2016-06-23 MED ORDER — EPHEDRINE SULFATE-NACL 50-0.9 MG/10ML-% IV SOSY
PREFILLED_SYRINGE | INTRAVENOUS | Status: DC | PRN
  Administered 2016-06-23: 10 mg via INTRAVENOUS
  Administered 2016-06-23: 5 mg via INTRAVENOUS

## 2016-06-23 MED ORDER — VANCOMYCIN HCL 1000 MG IV SOLR
INTRAVENOUS | Status: AC
Start: 2016-06-23 — End: 2016-06-23
  Filled 2016-06-23: qty 1000

## 2016-06-23 MED ORDER — CHLORHEXIDINE GLUCONATE 4 % EX LIQD: 60.0000 mL | Freq: Once | CUTANEOUS | Status: DC

## 2016-06-23 MED ORDER — DEXTROSE 5 % IV SOLN
INTRAVENOUS | Status: DC | PRN
  Administered 2016-06-23: 45 ug/min via INTRAVENOUS

## 2016-06-23 MED ORDER — OXYCODONE-ACETAMINOPHEN 5-325 MG PO TABS
2.0000 | ORAL_TABLET | Freq: Two times a day (BID) | ORAL | Status: DC | PRN
  Administered 2016-06-28: 2 via ORAL
  Filled 2016-06-23: qty 2

## 2016-06-23 MED ORDER — KCL IN DEXTROSE-NACL 20-5-0.45 MEQ/L-%-% IV SOLN
INTRAVENOUS | Status: DC
  Administered 2016-06-23: 18:00:00 via INTRAVENOUS
  Filled 2016-06-23 (×2): qty 1000

## 2016-06-23 MED ORDER — ALBUMIN HUMAN 5 % IV SOLN
INTRAVENOUS | Status: DC | PRN
  Administered 2016-06-23 (×2): via INTRAVENOUS

## 2016-06-23 MED ORDER — BUPIVACAINE HCL (PF) 0.5 % IJ SOLN
INTRAMUSCULAR | Status: DC | PRN
  Administered 2016-06-23: 7 mL

## 2016-06-23 MED ORDER — DOCUSATE SODIUM 100 MG PO CAPS
100.0000 mg | ORAL_CAPSULE | Freq: Every day | ORAL | Status: DC | PRN
  Administered 2016-06-27: 100 mg via ORAL

## 2016-06-23 MED ORDER — SILDENAFIL CITRATE 100 MG PO TABS: 100.0000 mg | ORAL_TABLET | ORAL | Status: DC | PRN

## 2016-06-23 MED ORDER — ONDANSETRON HCL 4 MG PO TABS
4.0000 mg | ORAL_TABLET | Freq: Four times a day (QID) | ORAL | Status: DC | PRN
  Administered 2016-06-27: 4 mg via ORAL
  Filled 2016-06-23: qty 1

## 2016-06-23 MED ORDER — MIDAZOLAM HCL 2 MG/2ML IJ SOLN
2.0000 mg | Freq: Once | INTRAMUSCULAR | Status: AC
  Administered 2016-06-23: 2 mg via INTRAVENOUS

## 2016-06-23 MED ORDER — HYDROCODONE-ACETAMINOPHEN 7.5-325 MG PO TABS
1.0000 | ORAL_TABLET | Freq: Four times a day (QID) | ORAL | Status: DC
  Administered 2016-06-23 – 2016-06-28 (×16): 1 via ORAL
  Filled 2016-06-23 (×18): qty 1

## 2016-06-23 MED ORDER — KETAMINE HCL 10 MG/ML IJ SOLN
INTRAMUSCULAR | Status: DC | PRN
  Administered 2016-06-23 (×4): 10 mg via INTRAVENOUS
  Administered 2016-06-23: 50 mg via INTRAVENOUS
  Administered 2016-06-23: 10 mg via INTRAVENOUS

## 2016-06-23 MED ORDER — VECURONIUM BROMIDE 10 MG IV SOLR
INTRAVENOUS | Status: AC
  Filled 2016-06-23: qty 20

## 2016-06-23 MED ORDER — NEBIVOLOL HCL 10 MG PO TABS
10.0000 mg | ORAL_TABLET | Freq: Every day | ORAL | Status: DC
  Administered 2016-06-24 – 2016-06-28 (×4): 10 mg via ORAL
  Filled 2016-06-23 (×7): qty 1

## 2016-06-23 MED ORDER — PANTOPRAZOLE SODIUM 40 MG IV SOLR
40.0000 mg | Freq: Every day | INTRAVENOUS | Status: DC
  Administered 2016-06-23: 40 mg via INTRAVENOUS
  Filled 2016-06-23: qty 40

## 2016-06-23 MED ORDER — ROCURONIUM BROMIDE 10 MG/ML (PF) SYRINGE
PREFILLED_SYRINGE | INTRAVENOUS | Status: AC
  Filled 2016-06-23: qty 5

## 2016-06-23 MED ORDER — FLEET ENEMA 7-19 GM/118ML RE ENEM: 1.0000 | ENEMA | Freq: Once | RECTAL | Status: DC | PRN

## 2016-06-23 MED ORDER — KETAMINE HCL-SODIUM CHLORIDE 100-0.9 MG/10ML-% IV SOSY
PREFILLED_SYRINGE | INTRAVENOUS | Status: AC
  Filled 2016-06-23: qty 10

## 2016-06-23 MED ORDER — LISINOPRIL-HYDROCHLOROTHIAZIDE 20-25 MG PO TABS: 1.0000 | ORAL_TABLET | Freq: Every day | ORAL | Status: DC

## 2016-06-23 MED ORDER — PROPOFOL 500 MG/50ML IV EMUL
INTRAVENOUS | Status: DC | PRN
  Administered 2016-06-23: 100 ug/kg/min via INTRAVENOUS

## 2016-06-23 MED ORDER — 0.9 % SODIUM CHLORIDE (POUR BTL) OPTIME
TOPICAL | Status: DC | PRN
  Administered 2016-06-23: 1000 mL

## 2016-06-23 MED ORDER — METHOCARBAMOL 1000 MG/10ML IJ SOLN
500.0000 mg | Freq: Four times a day (QID) | INTRAVENOUS | Status: DC | PRN
  Filled 2016-06-23: qty 5

## 2016-06-23 MED ORDER — DOCUSATE SODIUM 100 MG PO CAPS
100.0000 mg | ORAL_CAPSULE | Freq: Two times a day (BID) | ORAL | Status: DC
  Administered 2016-06-23 – 2016-06-29 (×10): 100 mg via ORAL
  Filled 2016-06-23 (×11): qty 1

## 2016-06-23 MED ORDER — ARTIFICIAL TEARS OPHTHALMIC OINT
TOPICAL_OINTMENT | OPHTHALMIC | Status: AC
  Filled 2016-06-23: qty 3.5

## 2016-06-23 MED ORDER — FENTANYL CITRATE (PF) 100 MCG/2ML IJ SOLN
25.0000 ug | Freq: Once | INTRAMUSCULAR | Status: AC
  Administered 2016-06-23: 50 ug via INTRAVENOUS

## 2016-06-23 MED ORDER — PROPOFOL 10 MG/ML IV BOLUS
INTRAVENOUS | Status: DC | PRN
  Administered 2016-06-23: 200 mg via INTRAVENOUS
  Administered 2016-06-23 (×3): 50 mg via INTRAVENOUS

## 2016-06-23 MED ORDER — LACTATED RINGERS IV SOLN
INTRAVENOUS | Status: DC | PRN
  Administered 2016-06-23 (×2): via INTRAVENOUS

## 2016-06-23 MED ORDER — SUGAMMADEX SODIUM 200 MG/2ML IV SOLN
INTRAVENOUS | Status: DC | PRN
  Administered 2016-06-23: 200 mg via INTRAVENOUS

## 2016-06-23 MED ORDER — SODIUM CHLORIDE 0.9% FLUSH: 3.0000 mL | INTRAVENOUS | Status: DC | PRN

## 2016-06-23 MED ORDER — CEFAZOLIN SODIUM-DEXTROSE 2-4 GM/100ML-% IV SOLN: 2.0000 g | INTRAVENOUS | Status: DC

## 2016-06-23 MED ORDER — ONDANSETRON HCL 4 MG/2ML IJ SOLN
INTRAMUSCULAR | Status: DC | PRN
  Administered 2016-06-23: 4 mg via INTRAVENOUS

## 2016-06-23 MED ORDER — OXYCODONE HCL 5 MG PO TABS
5.0000 mg | ORAL_TABLET | ORAL | Status: DC | PRN
  Administered 2016-06-23 (×2): 5 mg via ORAL
  Administered 2016-06-24 – 2016-06-27 (×8): 10 mg via ORAL
  Filled 2016-06-23 (×2): qty 2
  Filled 2016-06-23: qty 1
  Filled 2016-06-23: qty 2
  Filled 2016-06-23: qty 1
  Filled 2016-06-23 (×6): qty 2

## 2016-06-23 MED ORDER — ONDANSETRON HCL 4 MG/2ML IJ SOLN
INTRAMUSCULAR | Status: AC
  Filled 2016-06-23: qty 2

## 2016-06-23 MED ORDER — BUPIVACAINE HCL (PF) 0.5 % IJ SOLN
INTRAMUSCULAR | Status: AC
  Filled 2016-06-23: qty 30

## 2016-06-23 MED ORDER — ROCURONIUM BROMIDE 100 MG/10ML IV SOLN
INTRAVENOUS | Status: DC | PRN
  Administered 2016-06-23 (×2): 50 mg via INTRAVENOUS

## 2016-06-23 MED ORDER — OXYCODONE HCL 5 MG PO TABS
ORAL_TABLET | ORAL | Status: AC
  Filled 2016-06-23: qty 1

## 2016-06-23 MED ORDER — POTASSIUM CHLORIDE CRYS ER 10 MEQ PO TBCR
10.0000 meq | EXTENDED_RELEASE_TABLET | Freq: Every day | ORAL | Status: DC
  Administered 2016-06-24 – 2016-06-29 (×5): 10 meq via ORAL
  Filled 2016-06-23 (×5): qty 1

## 2016-06-23 MED ORDER — THROMBIN 20000 UNITS EX SOLR
CUTANEOUS | Status: DC | PRN
  Administered 2016-06-23: 08:00:00 via TOPICAL

## 2016-06-23 MED ORDER — FENTANYL CITRATE (PF) 250 MCG/5ML IJ SOLN
INTRAMUSCULAR | Status: AC
Start: 2016-06-23 — End: 2016-06-23
  Filled 2016-06-23: qty 5

## 2016-06-23 MED ORDER — ALBUMIN HUMAN 5 % IV SOLN: INTRAVENOUS | Status: DC | PRN

## 2016-06-23 MED ORDER — LIDOCAINE HCL (CARDIAC) 20 MG/ML IV SOLN
INTRAVENOUS | Status: DC | PRN
  Administered 2016-06-23: 20 mg via INTRAVENOUS
  Administered 2016-06-23: 80 mg via INTRAVENOUS

## 2016-06-23 MED ORDER — CEFAZOLIN SODIUM 1 G IJ SOLR
INTRAMUSCULAR | Status: AC
  Filled 2016-06-23: qty 30

## 2016-06-23 MED ORDER — HYDROMORPHONE HCL 1 MG/ML IJ SOLN
INTRAMUSCULAR | Status: AC
  Filled 2016-06-23: qty 0.5

## 2016-06-23 MED ORDER — POLYETHYLENE GLYCOL 3350 17 G PO PACK
17.0000 g | PACK | Freq: Every day | ORAL | Status: DC | PRN
  Administered 2016-06-28 – 2016-06-29 (×2): 17 g via ORAL
  Filled 2016-06-23: qty 1

## 2016-06-23 MED ORDER — POTASSIUM 75 MG PO TABS: ORAL_TABLET | Freq: Every day | ORAL | Status: DC

## 2016-06-23 MED ORDER — PROPOFOL 10 MG/ML IV BOLUS
INTRAVENOUS | Status: AC
  Filled 2016-06-23: qty 40

## 2016-06-23 MED ORDER — ARTIFICIAL TEARS OPHTHALMIC OINT
TOPICAL_OINTMENT | OPHTHALMIC | Status: DC | PRN
  Administered 2016-06-23: 1 via OPHTHALMIC

## 2016-06-23 MED ORDER — LIDOCAINE-EPINEPHRINE 1 %-1:100000 IJ SOLN
INTRAMUSCULAR | Status: AC
  Filled 2016-06-23: qty 1

## 2016-06-23 MED ORDER — MORPHINE SULFATE (PF) 2 MG/ML IV SOLN
2.0000 mg | INTRAVENOUS | Status: DC | PRN
  Filled 2016-06-23: qty 1

## 2016-06-23 MED ORDER — FENTANYL CITRATE (PF) 100 MCG/2ML IJ SOLN
INTRAMUSCULAR | Status: DC | PRN
  Administered 2016-06-23: 100 ug via INTRAVENOUS
  Administered 2016-06-23 (×2): 150 ug via INTRAVENOUS
  Administered 2016-06-23 (×2): 50 ug via INTRAVENOUS
  Administered 2016-06-23: 100 ug via INTRAVENOUS
  Administered 2016-06-23: 50 ug via INTRAVENOUS
  Administered 2016-06-23: 100 ug via INTRAVENOUS

## 2016-06-23 MED ORDER — LIDOCAINE-EPINEPHRINE 1 %-1:100000 IJ SOLN
INTRAMUSCULAR | Status: DC | PRN
  Administered 2016-06-23: 7 mL

## 2016-06-23 MED ORDER — DULOXETINE HCL 20 MG PO CPEP
40.0000 mg | ORAL_CAPSULE | Freq: Every day | ORAL | Status: DC
  Administered 2016-06-23 – 2016-06-29 (×6): 40 mg via ORAL
  Filled 2016-06-23 (×7): qty 2

## 2016-06-23 MED ORDER — SODIUM CHLORIDE 0.9 % IV SOLN
0.1000 mg/kg/h | INTRAVENOUS | Status: DC
  Administered 2016-06-23: 40 mg/h via INTRAVENOUS
  Filled 2016-06-23 (×2): qty 2

## 2016-06-23 MED ORDER — THROMBIN 20000 UNITS EX SOLR
CUTANEOUS | Status: AC
  Filled 2016-06-23: qty 20000

## 2016-06-23 MED ORDER — FENTANYL CITRATE (PF) 250 MCG/5ML IJ SOLN
INTRAMUSCULAR | Status: AC
  Filled 2016-06-23: qty 10

## 2016-06-23 MED ORDER — GELATIN ABSORBABLE MT POWD
OROMUCOSAL | Status: DC | PRN
  Administered 2016-06-23: 08:00:00 via TOPICAL

## 2016-06-23 MED ORDER — ONDANSETRON HCL 4 MG/2ML IJ SOLN
4.0000 mg | Freq: Four times a day (QID) | INTRAMUSCULAR | Status: DC | PRN
  Administered 2016-06-27: 4 mg via INTRAVENOUS
  Filled 2016-06-23: qty 2

## 2016-06-23 MED ORDER — CYCLOBENZAPRINE HCL 10 MG PO TABS
10.0000 mg | ORAL_TABLET | Freq: Three times a day (TID) | ORAL | Status: DC | PRN
  Administered 2016-06-26 – 2016-06-29 (×4): 10 mg via ORAL
  Filled 2016-06-23 (×4): qty 1

## 2016-06-23 MED ORDER — MAGNESIUM 200 MG PO TABS: ORAL_TABLET | Freq: Every day | ORAL | Status: DC

## 2016-06-23 MED ORDER — DEXTROSE 5 % IV SOLN
INTRAVENOUS | Status: DC | PRN
  Administered 2016-06-23: 08:00:00 via INTRAVENOUS

## 2016-06-23 MED ORDER — PHENYLEPHRINE HCL 10 MG/ML IJ SOLN
INTRAMUSCULAR | Status: DC | PRN
  Administered 2016-06-23: 160 ug via INTRAVENOUS

## 2016-06-23 MED ORDER — MENTHOL 3 MG MT LOZG: 1.0000 | LOZENGE | OROMUCOSAL | Status: DC | PRN

## 2016-06-23 MED ORDER — SODIUM CHLORIDE 0.9% FLUSH
3.0000 mL | Freq: Two times a day (BID) | INTRAVENOUS | Status: DC
  Administered 2016-06-23 – 2016-06-28 (×9): 3 mL via INTRAVENOUS

## 2016-06-23 MED ORDER — MAGNESIUM OXIDE 400 (241.3 MG) MG PO TABS
400.0000 mg | ORAL_TABLET | Freq: Every day | ORAL | Status: DC
  Administered 2016-06-23 – 2016-06-28 (×6): 400 mg via ORAL
  Filled 2016-06-23 (×6): qty 1

## 2016-06-23 MED ORDER — LIDOCAINE 2% (20 MG/ML) 5 ML SYRINGE
INTRAMUSCULAR | Status: AC
  Filled 2016-06-23: qty 5

## 2016-06-23 MED ORDER — ALUM & MAG HYDROXIDE-SIMETH 200-200-20 MG/5ML PO SUSP: 30.0000 mL | Freq: Four times a day (QID) | ORAL | Status: DC | PRN

## 2016-06-23 MED ORDER — ARTIFICIAL TEARS OPHTHALMIC OINT
TOPICAL_OINTMENT | OPHTHALMIC | Status: AC
  Filled 2016-06-23: qty 7

## 2016-06-23 MED ORDER — BISACODYL 10 MG RE SUPP
10.0000 mg | Freq: Every day | RECTAL | Status: DC | PRN
  Administered 2016-06-24: 10 mg via RECTAL
  Filled 2016-06-23: qty 1

## 2016-06-23 MED ORDER — THROMBIN 5000 UNITS EX SOLR
CUTANEOUS | Status: AC
  Filled 2016-06-23: qty 5000

## 2016-06-23 MED ORDER — SODIUM CHLORIDE 0.9 % IV SOLN
250.0000 mL | INTRAVENOUS | Status: DC
  Administered 2016-06-23: 250 mL via INTRAVENOUS

## 2016-06-23 MED ORDER — ACETAMINOPHEN 650 MG RE SUPP: 650.0000 mg | RECTAL | Status: DC | PRN

## 2016-06-23 MED ORDER — GABAPENTIN 300 MG PO CAPS
300.0000 mg | ORAL_CAPSULE | Freq: Three times a day (TID) | ORAL | Status: DC
  Administered 2016-06-23 – 2016-06-29 (×15): 300 mg via ORAL
  Filled 2016-06-23 (×16): qty 1

## 2016-06-23 MED ORDER — FENTANYL CITRATE (PF) 100 MCG/2ML IJ SOLN: 25.0000 ug | Freq: Once | INTRAMUSCULAR | Status: DC

## 2016-06-23 MED ORDER — HYDROCHLOROTHIAZIDE 25 MG PO TABS
25.0000 mg | ORAL_TABLET | Freq: Every day | ORAL | Status: DC
  Administered 2016-06-24 – 2016-06-25 (×2): 25 mg via ORAL
  Filled 2016-06-23 (×5): qty 1

## 2016-06-23 MED ORDER — VECURONIUM BROMIDE 10 MG IV SOLR
INTRAVENOUS | Status: DC | PRN
  Administered 2016-06-23 (×2): 5 mg via INTRAVENOUS

## 2016-06-23 MED ORDER — CEFAZOLIN SODIUM 10 G IJ SOLR
3.0000 g | Freq: Three times a day (TID) | INTRAMUSCULAR | Status: AC
  Administered 2016-06-23 – 2016-06-24 (×2): 3 g via INTRAVENOUS
  Filled 2016-06-23 (×3): qty 3000

## 2016-06-23 MED ORDER — CALCIUM CARBONATE ANTACID 500 MG PO CHEW: 3.0000 | CHEWABLE_TABLET | Freq: Every evening | ORAL | Status: DC | PRN

## 2016-06-23 MED FILL — Sodium Chloride IV Soln 0.9%: INTRAVENOUS | Qty: 1000 | Status: AC

## 2016-06-23 MED FILL — Heparin Sodium (Porcine) Inj 1000 Unit/ML: INTRAMUSCULAR | Qty: 30 | Status: AC

## 2016-06-23 SURGICAL SUPPLY — 107 items
APPLIER CLIP 11 MED OPEN (CLIP) ×4
BASE TI BOLT 5.0X22.5 VARIABLE (Bolt) ×4 IMPLANT
BASE TI IMPLANT 8X38X28 15DEG (Neuro Prosthesis/Implant) ×4 IMPLANT
BASKET BONE COLLECTION (BASKET) IMPLANT
BLADE CLIPPER SURG (BLADE) ×4 IMPLANT
BOLT BASE TI 5X17.5 VARIABLE (Bolt) ×8 IMPLANT
BUR BARREL STRAIGHT FLUTE 4.0 (BURR) ×4 IMPLANT
CAGE MODULUS XLW 12X22X60 - 15 (Cage) ×4 IMPLANT
CANISTER SUCT 3000ML PPV (MISCELLANEOUS) ×4 IMPLANT
CARTRIDGE OIL MAESTRO DRILL (MISCELLANEOUS) ×2 IMPLANT
CLIP APPLIE 11 MED OPEN (CLIP) ×2 IMPLANT
CLIP LIGATING EXTRA MED SLVR (CLIP) ×4 IMPLANT
CLIP LIGATING EXTRA SM BLUE (MISCELLANEOUS) ×4 IMPLANT
COVER BACK TABLE 24X17X13 BIG (DRAPES) IMPLANT
COVER BACK TABLE 60X90IN (DRAPES) ×4 IMPLANT
DECANTER SPIKE VIAL GLASS SM (MISCELLANEOUS) ×8 IMPLANT
DERMABOND ADVANCED (GAUZE/BANDAGES/DRESSINGS) ×8
DERMABOND ADVANCED .7 DNX12 (GAUZE/BANDAGES/DRESSINGS) ×8 IMPLANT
DIFFUSER DRILL AIR PNEUMATIC (MISCELLANEOUS) ×4 IMPLANT
DRAPE C-ARM 42X72 X-RAY (DRAPES) ×8 IMPLANT
DRAPE C-ARMOR (DRAPES) ×8 IMPLANT
DRAPE LAPAROTOMY 100X72X124 (DRAPES) ×8 IMPLANT
DRAPE POUCH INSTRU U-SHP 10X18 (DRAPES) ×8 IMPLANT
DRSG OPSITE POSTOP 3X4 (GAUZE/BANDAGES/DRESSINGS) ×8 IMPLANT
DRSG OPSITE POSTOP 4X8 (GAUZE/BANDAGES/DRESSINGS) ×4 IMPLANT
DURAPREP 26ML APPLICATOR (WOUND CARE) ×8 IMPLANT
ELECT BLADE 4.0 EZ CLEAN MEGAD (MISCELLANEOUS) ×4
ELECT REM PT RETURN 9FT ADLT (ELECTROSURGICAL) ×8
ELECTRODE BLDE 4.0 EZ CLN MEGD (MISCELLANEOUS) ×2 IMPLANT
ELECTRODE REM PT RTRN 9FT ADLT (ELECTROSURGICAL) ×4 IMPLANT
GAUZE SPONGE 4X4 12PLY STRL (GAUZE/BANDAGES/DRESSINGS) IMPLANT
GAUZE SPONGE 4X4 16PLY XRAY LF (GAUZE/BANDAGES/DRESSINGS) IMPLANT
GLOVE BIO SURGEON STRL SZ8 (GLOVE) ×8 IMPLANT
GLOVE BIOGEL PI IND STRL 7.5 (GLOVE) ×6 IMPLANT
GLOVE BIOGEL PI IND STRL 8 (GLOVE) ×10 IMPLANT
GLOVE BIOGEL PI IND STRL 8.5 (GLOVE) ×4 IMPLANT
GLOVE BIOGEL PI INDICATOR 7.5 (GLOVE) ×6
GLOVE BIOGEL PI INDICATOR 8 (GLOVE) ×10
GLOVE BIOGEL PI INDICATOR 8.5 (GLOVE) ×4
GLOVE ECLIPSE 7.5 STRL STRAW (GLOVE) ×24 IMPLANT
GLOVE ECLIPSE 8.0 STRL XLNG CF (GLOVE) ×8 IMPLANT
GLOVE EXAM NITRILE LRG STRL (GLOVE) IMPLANT
GLOVE EXAM NITRILE XL STR (GLOVE) IMPLANT
GLOVE EXAM NITRILE XS STR PU (GLOVE) IMPLANT
GLOVE SS BIOGEL STRL SZ 7.5 (GLOVE) ×2 IMPLANT
GLOVE SUPERSENSE BIOGEL SZ 7.5 (GLOVE) ×2
GOWN STRL REUS W/ TWL LRG LVL3 (GOWN DISPOSABLE) ×2 IMPLANT
GOWN STRL REUS W/ TWL XL LVL3 (GOWN DISPOSABLE) ×6 IMPLANT
GOWN STRL REUS W/TWL 2XL LVL3 (GOWN DISPOSABLE) ×12 IMPLANT
GOWN STRL REUS W/TWL LRG LVL3 (GOWN DISPOSABLE) ×2
GOWN STRL REUS W/TWL XL LVL3 (GOWN DISPOSABLE) ×6
INSERT FOGARTY 61MM (MISCELLANEOUS) IMPLANT
INSERT FOGARTY SM (MISCELLANEOUS) IMPLANT
KIT BASIN OR (CUSTOM PROCEDURE TRAY) ×8 IMPLANT
KIT DILATOR XLIF 5 (KITS) ×2 IMPLANT
KIT INFUSE SMALL (Orthopedic Implant) ×4 IMPLANT
KIT INFUSE XX SMALL 0.7CC (Orthopedic Implant) ×4 IMPLANT
KIT ROOM TURNOVER OR (KITS) ×8 IMPLANT
KIT SURGICAL ACCESS MAXCESS 4 (KITS) ×4 IMPLANT
KIT XLIF (KITS) ×2
LOOP VESSEL MAXI BLUE (MISCELLANEOUS) IMPLANT
LOOP VESSEL MINI RED (MISCELLANEOUS) IMPLANT
MARKER SKIN DUAL TIP RULER LAB (MISCELLANEOUS) ×4 IMPLANT
MODULE NVM5 NEXT GEN EMG (NEEDLE) ×4 IMPLANT
MODULUS XLW 10X22X55MM 10 (Spine Construct) ×8 IMPLANT
NEEDLE HYPO 25X1 1.5 SAFETY (NEEDLE) ×4 IMPLANT
NEEDLE SPNL 18GX3.5 QUINCKE PK (NEEDLE) ×4 IMPLANT
NS IRRIG 1000ML POUR BTL (IV SOLUTION) ×8 IMPLANT
OIL CARTRIDGE MAESTRO DRILL (MISCELLANEOUS) ×4
PACK LAMINECTOMY NEURO (CUSTOM PROCEDURE TRAY) ×8 IMPLANT
PAD ARMBOARD 7.5X6 YLW CONV (MISCELLANEOUS) ×16 IMPLANT
PUTTY BONE ATTRAX 10CC STRIP (Putty) ×8 IMPLANT
PUTTY BONE ATTRAX 5CC STRIP (Putty) ×4 IMPLANT
SPONGE INTESTINAL PEANUT (DISPOSABLE) ×8 IMPLANT
SPONGE LAP 18X18 X RAY DECT (DISPOSABLE) ×8 IMPLANT
SPONGE LAP 4X18 X RAY DECT (DISPOSABLE) IMPLANT
SPONGE SURGIFOAM ABS GEL SZ50 (HEMOSTASIS) IMPLANT
STAPLER SKIN PROX WIDE 3.9 (STAPLE) ×4 IMPLANT
STAPLER VISISTAT 35W (STAPLE) ×4 IMPLANT
SUT PDS AB 1 CTX 36 (SUTURE) ×4 IMPLANT
SUT PROLENE 4 0 RB 1 (SUTURE)
SUT PROLENE 4-0 RB1 .5 CRCL 36 (SUTURE) IMPLANT
SUT PROLENE 5 0 CC1 (SUTURE) ×4 IMPLANT
SUT PROLENE 6 0 C 1 30 (SUTURE) ×4 IMPLANT
SUT PROLENE 6 0 CC (SUTURE) IMPLANT
SUT SILK 0 TIES 10X30 (SUTURE) ×4 IMPLANT
SUT SILK 2 0 TIES 10X30 (SUTURE) ×4 IMPLANT
SUT SILK 2 0 TIES 17X18 (SUTURE) ×2
SUT SILK 2 0SH CR/8 30 (SUTURE) IMPLANT
SUT SILK 2-0 18XBRD TIE BLK (SUTURE) ×2 IMPLANT
SUT SILK 3 0 TIES 10X30 (SUTURE) ×4 IMPLANT
SUT SILK 3 0SH CR/8 30 (SUTURE) IMPLANT
SUT VIC AB 0 CT1 18XCR BRD8 (SUTURE) ×8 IMPLANT
SUT VIC AB 0 CT1 8-18 (SUTURE) ×8
SUT VIC AB 1 CT1 18XBRD ANBCTR (SUTURE) IMPLANT
SUT VIC AB 1 CT1 8-18 (SUTURE)
SUT VIC AB 2-0 CT1 18 (SUTURE) ×8 IMPLANT
SUT VIC AB 2-0 CT1 36 (SUTURE) ×12 IMPLANT
SUT VIC AB 2-0 CTX 36 (SUTURE) IMPLANT
SUT VIC AB 3-0 SH 27 (SUTURE) ×2
SUT VIC AB 3-0 SH 27X BRD (SUTURE) ×2 IMPLANT
SUT VIC AB 3-0 SH 8-18 (SUTURE) ×16 IMPLANT
SUT VICRYL 4-0 PS2 18IN ABS (SUTURE) IMPLANT
TOWEL GREEN STERILE (TOWEL DISPOSABLE) ×8 IMPLANT
TOWEL GREEN STERILE FF (TOWEL DISPOSABLE) ×8 IMPLANT
TRAY FOLEY W/METER SILVER 16FR (SET/KITS/TRAYS/PACK) ×4 IMPLANT
WATER STERILE IRR 1000ML POUR (IV SOLUTION) ×8 IMPLANT

## 2016-06-23 NOTE — Op Note (Signed)
    OPERATIVE REPORT  DATE OF SURGERY: 06/23/2016  PATIENT: Travis Huff, 56 y.o. male MRN: [id number]  DOB: [birth year]  PRE-OPERATIVE DIAGNOSIS: Degenerative disc disease  POST-OPERATIVE DIAGNOSIS:  Same  PROCEDURE: Anterior exposure for L5-S1 disc fusion  SURGEON:  Curt Jews, M.D.  Co-surgeon for the exposure Dr. Dierdre Harness  PHYSICIAN ASSISTANT: Verdis Prime RN  ANESTHESIA:  Gen.  EBL: 100 ml  Total I/O In: 3100 [I.V.:2600; IV Piggyback:500] Out: 1050 [Urine:950; Blood:100]  BLOOD ADMINISTERED: None  DRAINS: None  SPECIMEN: None  COUNTS CORRECT:  YES  PLAN OF CARE: PACU   PATIENT DISPOSITION:  PACU - hemodynamically stable  PROCEDURE DETAILS: The patient was taken to the operative placed supine position where the abdomen was prepped and draped using sterile fashion. Incision was made just above the pubic bone from the midline to the left carried down to the subcutaneous fat with electrocautery. Anterior rectus sheath was opened with a left cautery in line with the skin incision. The rectus muscle was mobilized circumferentially. The retroperitoneal space was entered bluntly and the left lower quadrant and the intracranial contents was mobilized to the right. The posterior rectus sheath was opened laterally. Blunt dissection was used to continue mobilization of the left ureter and intraperitoneal contents to the right. The L5-S1 disc was identified and Kitner dissectors were used to mobilize the tissue off the anterior surface. The middle sacral vessels were clipped with ligaclips and divided. Dissection was extended to the right and left of the L5-S1 disc and also superior and inferiorly. The Thompson retractor was brought onto the field and the reverse lip 150 blade was positioned to the right and left of the L5-S1 disc. The 190 malleable retractors were used for superior and inferior exposure. Spinal needle was placed in the L5-S1 disc and C-arm was brought onto the  field to confirm this was indeed the correct disc level. The remainder of the procedure will be dictated as a separate note with Dr. Gari Crown, M.D., [facility] 06/23/2016 12:33 PM

## 2016-06-23 NOTE — OR Nursing (Signed)
Nuvasive electrode nerve monitoring applied to upper and lower extremity for lateral fusion

## 2016-06-23 NOTE — Anesthesia Postprocedure Evaluation (Signed)
Anesthesia Post Note  Patient: [name]  Procedure(s) Performed: Procedure(s) (LRB): Lumbar five -sacral one  Anterior lumbar interbody fusion with Dr. Sherren Mocha Early for approach (N/A) Right  Lumbar two-three Lumbar three-four Lumbar four-five Anterolateral lumbar interbody fusion (Right) ABDOMINAL EXPOSURE (N/A)     Patient location during evaluation: PACU Anesthesia Type: General Level of consciousness: awake and alert Pain management: pain level controlled Vital Signs Assessment: post-procedure vital signs reviewed and stable Respiratory status: spontaneous breathing, nonlabored ventilation, respiratory function stable and patient connected to nasal cannula oxygen Cardiovascular status: blood pressure returned to baseline and stable Postop Assessment: no signs of nausea or vomiting Anesthetic complications: no    Last Vitals:  Vitals:   06/23/16 1608 06/23/16 1625  BP:  (!) 149/91  Pulse:  72  Resp:  20  Temp: 36.7 C 36.4 C    Last Pain:  Vitals:   06/23/16 1625  TempSrc: Oral  PainSc: 7                  Trixie Maclaren,W. EDMOND

## 2016-06-23 NOTE — Progress Notes (Signed)
Awake, alert, conversant.  MAEW with full strength.  No numbness.  Doing well.

## 2016-06-23 NOTE — Care Management Note (Signed)
Case Management Note  Patient Details  Name: Travis Huff MRN: [id number] Date of Birth: 04/12/1960  Subjective/Objective:  Lumbar fusion                  Action/Plan: Discharge Planning: NCM spoke to pt and wife, Travis Huff at bedside. Pt states he has RW, cane and bedside commode at home. Offered choice for [facility]. Pt states he had [facility] in the past for HHPT. Waiting on final recommendations for home.    PCP Silverio Decamp MD  Expected Discharge Date:                  Expected Discharge Plan:  [place]  In-House Referral:  NA  Discharge planning Services  CM Consult  Post Acute Care Choice:  Home Health Choice offered to:  Spouse, Patient  DME Arranged:  N/A DME Agency:  NA  HH Arranged:    [place] Agency:  [place]  Status of Service:  In process, will continue to follow  If discussed at Long Length of Stay Meetings, dates discussed:    Additional Comments:  Erenest Rasher, RN 06/23/2016, 5:36 PM

## 2016-06-23 NOTE — Transfer of Care (Signed)
Immediate Anesthesia Transfer of Care Note  Patient: Travis Huff  Procedure(s) Performed: Procedure(s): Lumbar five -sacral one  Anterior lumbar interbody fusion with Dr. Sherren Mocha Early for approach (N/A) Right  Lumbar two-three Lumbar three-four Lumbar four-five Anterolateral lumbar interbody fusion (Right) ABDOMINAL EXPOSURE (N/A)  Patient Location: PACU  Anesthesia Type:General  Level of Consciousness: awake, oriented, sedated, patient cooperative and responds to stimulation  Airway & Oxygen Therapy: Patient Spontanous Breathing and Patient connected to nasal cannula oxygen  Post-op Assessment: Report given to RN, Post -op Vital signs reviewed and stable, Patient moving all extremities and Patient moving all extremities X 4  Post vital signs: Reviewed and stable  Last Vitals:  Vitals:   06/23/16 0609  BP: (!) 136/94  Pulse: 74  Resp: 18  Temp: 37.3 C    Last Pain:  Vitals:   06/23/16 0618  TempSrc:   PainSc: 6          Complications: No apparent anesthesia complications

## 2016-06-23 NOTE — Interval H&P Note (Signed)
History and Physical Interval Note:  06/23/2016 7:33 AM  Travis Huff  has presented today for surgery, with the diagnosis of Scoliosis  The various methods of treatment have been discussed with the patient and family. After consideration of risks, benefits and other options for treatment, the patient has consented to  Procedure(s) with comments: L5-S1 Anterior lumbar interbody fusion with Dr. Sherren Mocha Early for approach (N/A) - L5-S1 Anterior lumbar interbody fusion with Dr. Sherren Mocha Early for approach Right L1-2 L2-3 L3-4 L4-5 Anterolateral lumbar interbody fusion (Right) - Right L1-2 L2-3 L3-4 L4-5 Anterolateral lumbar interbody fusion ABDOMINAL EXPOSURE (N/A) as a surgical intervention .  The patient's history has been reviewed, patient examined, no change in status, stable for surgery.  I have reviewed the patient's chart and labs.  Questions were answered to the patient's satisfaction.     [name]

## 2016-06-23 NOTE — H&P (Signed)
Patient ID:   [id number] Patient: Travis Huff  Date of Birth: [birth year] Visit Type: Office Visit   Date: 05/24/2016 03:00 PM Provider: Marchia Meiers. [name] MD   This 56 year old male presents for back pain.   History of Present Illness: 1.  back pain  Patient returns to review his MRI  The MRI is essentially unchanged compared to his previous MRI although there has been gradual worsening at virtually every level in his lumbar spine.  There does appear to be more right-sided stenosis  at the L1-2 level as well , compared to his previous MRI.    The surgical plan that I outlined with the patient  at the time of his  last visit  has not changed.  The patient came to the office today with his wife, who asked many questions and we went over the details of the surgery as well as extensive patient teaching regarding  surgical and post surgical care.  I answered their questions.  Then Aaron Edelman went over specifics of the surgery as well as models  in great detail.         Medical/Surgical/Interim History Reviewed, no change.  Last detailed document date:01/26/2014.     PAST MEDICAL HISTORY, SURGICAL HISTORY, FAMILY HISTORY, SOCIAL HISTORY AND REVIEW OF SYSTEMS I have reviewed the patient's past medical, surgical, family and social history as well as the comprehensive review of systems as included on the [facility] history form dated 05/01/2016, which I have signed.  Family History: Reviewed, no changes.  Last detailed document date:01/26/2014.   Social History: Reviewed, no changes. Last detailed document date: 01/26/2014.    MEDICATIONS(added, continued or stopped this visit): Started Medication Directions Instruction Stopped   Celebrex 200 mg capsule take 1 capsule by oral route  every day as needed     gabapentin take 1 capsule by oral route 3 times every day  05/24/2016  01/26/2014 Mobic 15 mg tablet take 1 tablet by oral route  every day  05/24/2016    OXYCODONE HCL/ACETAMINOPHEN take 1 tablet by oral route 2 times every day as needed  05/24/2016  05/24/2016 Percocet 5 mg-325 mg tablet Take 1-2 po q4-6hrs prn pain       ALLERGIES: Ingredient Reaction Medication Name Comment  MORPHINE Itching     Reviewed, no changes.    Vitals Date Temp F BP Pulse Ht In Wt Lb BMI BSA Pain Score  05/24/2016  154/95 77 72 287 38.92  8/10      IMPRESSION    Planned staged lumbar  decompression with  thoracolumbar   fusion as previously scheduled .  The patient is in considerable discomfort and I have given  him a prescription for pain medication.  Assessment/Plan # Detail Type Description   1. Assessment Radiculopathy, lumbar region (M54.16).       2. Assessment Scoliosis, unspecified (M41.9).       3. Assessment Spinal stenosis of lumbar region with neurogenic claudication (M48.062).       4. Assessment Elevated blood-pressure reading, w/o diagnosis of htn (R03.0).       5. Assessment Body mass index (BMI) 38.0-38.9, adult (A41.66).   Plan Orders Today's instructions / counseling include(s) Dietary management education, guidance, and counseling.           Pain Management Plan Pain Scale: 8/10. Method: Numeric Pain Intensity Scale. Location: back. Onset: 01/27/2011. Duration: varies. Quality: discomforting. Pain management follow-up plan of care: Patient taking medication as prescribed..  Fall  Risk Plan The patient has fallen 1 times in the last year.  Falls risk follow-up plan of care: Assisted devices: Advise to use safety measures when available.     Surgery as previously planned patient has been fitted for TLSO brace  for wearing after surgery.  Orders: Instruction(s)/Education: Assessment Instruction  R03.0 Hypertension education  [phone] Dietary management education, guidance, and counseling    MEDICATIONS PRESCRIBED TODAY    Rx Quantity Refills  PERCOCET 5 mg-325 mg  60 0            Provider:  [name] MD,  Marchia Meiers 05/29/2016 1:57 PM  Dictation edited by: Marchia Meiers. [name]    CC Providers: Aundria Mems [address] [place],  [place]  [phone]   Robert Fried  [place] [address], [place] [address]-              Electronically signed by Marchia Meiers. [name] MD on 05/29/2016 01:58 PM  Patient ID:   [id number] Patient: Travis Huff  Date of Birth: [birth year] Visit Type: Office Visit   Date: 05/01/2016 09:45 AM Provider: Marchia Meiers. [name] MD   This 56 year old male presents for back pain.  History of Present Illness: 1.  back pain    Mr. Biehn, 56 y/o male, reports that his back pain has worsened considerably since his last visit. he has pain in both legs. He says that both legs and feet can go numb while driving. His numbness always starts on the left side and goes across his buttocks and into his right leg. burning and numbness can go all the way into his rectal cavity. numbness is constant. he has 25 yards maximum distance walking and 4 minutes maximum standing. he had an episode of falling recently. reports decreased coordination. mentioned recent anxiety and depression related to back problem      Medical/Surgical/Interim History Reviewed, no change.  Last detailed document date:01/26/2014.   Family History: Reviewed, no changes.  Last detailed document: 01/26/2014.   Social History: Tobacco use reviewed. Reviewed, no changes. Last detailed document date: 01/26/2014.      MEDICATIONS(added, continued or stopped this visit): Started Medication Directions Instruction Stopped   gabapentin take 1 capsule by oral route 3 times every day    01/26/2014 Mobic 15 mg tablet take 1 tablet by oral route  every day     OXYCODONE HCL/ACETAMINOPHEN take 1 tablet by oral route 2 times every day as needed       ALLERGIES: Ingredient Reaction Medication Name Comment  MORPHINE Itching     Reviewed, no  changes.    Vitals Date Temp F BP Pulse Ht In Wt Lb BMI BSA Pain Score  05/01/2016  151/96 66 72 292.6 39.68  8/10      IMPRESSION 4 view lumbar spine and AP/lateral scoliosis x-rays reveal exaggerated curve in thoracic spine and flattened lumbar spine. kyphotic at thoracal lumbar junction. retroverting of pelvis. levo-convex scoliosis.   need bending x-rays with bolster behind thoracic     on confrontational testing, LE strength normal,   less inclined to put patient through smaller surgery due to scoliosis and potential for future problems. recommend ALF L5-S1, XLIF from right L2-3, L3-4, L4-5, 2nd stage T10 to pelvis fixation.    MRI reveals spinal stenosis and fat deposition around nerves but need new lumbar MRI.   Completed Orders (this encounter) Order Details Reason Side Interpretation Result Initial Treatment  Date Region  Scoliosis- AP/Lat      05/01/2016   Lumbar Spine- AP/Lat/Flex/Ex      05/01/2016   Hypertension education Continue to monitor blood pressure. If blood pressure remains elevated contact primary care provider        Dietary management education, guidance, and counseling patient encouraged to eat a well balanced diet         Assessment/Plan # Detail Type Description   1. Assessment Spondylolisthesis, lumbar region (M43.16).       2. Assessment Scoliosis, unspecified (M41.9).   Plan Orders TLSO Brace (Drawstring).       3. Assessment Elevated blood-pressure reading, w/o diagnosis of htn (R03.0).       4. Assessment Body mass index (BMI) 39.0-39.9, adult (Z68.39).   Plan Orders Today's instructions / counseling include(s) Dietary management education, guidance, and counseling.       5. Assessment Radiculopathy, lumbar region (M54.16).         Pain Assessment/Treatment Pain Scale: 8/10. Method: Numeric Pain Intensity Scale. Location: back. Onset: 01/27/2011. Duration: varies. Quality: discomforting. Pain Assessment/Treatment follow-up plan of  care: Patient taking medication as prescribed..  Fall Risk Plan The Patient has fallen 1 times in the last year.  Falls risk follow-up plan of care: Assisted devices: Advise to use safety measures when available.  scheduled new lumbar MRI. nurse education given. scheduled ALF L5-S1, XLIF from right L2-3, L3-4, L4-5, 2nd stage T10 to pelvis fixation if new lumbar MRI shows no changes.  Orders: Diagnostic Procedures: Assessment Procedure  M41.9 Scoliosis- AP/Lat  M41.9 Thoraco-lumbar Spine- Lateral  M43.16 Lumbar Spine- AP/Lat/Flex/Ex  M43.16 MRI Spine/lumb W/o Contrast  M54.16 Scoliosis- AP/Lat  Instruction(s)/Education: Assessment Instruction  R03.0 Hypertension education  Z68.39 Dietary management education, guidance, and counseling  Miscellaneous: Assessment   M41.9 TLSO Brace (Drawstring)             Provider:  Marchia Meiers. [name] MD  05/01/2016 05:09 PM Dictation edited by: Dionne Bucy    CC Providers: Aundria Mems [address] [place],  [place]  [phone]   [place] [address] [place] [address]-              Electronically signed by Marchia Meiers. [name] MD on 05/04/2016 05:41 PM

## 2016-06-23 NOTE — Anesthesia Procedure Notes (Signed)
Procedure Name: Intubation Date/Time: 06/23/2016 7:47 AM Performed by: Jacquiline Doe A Pre-anesthesia Checklist: Patient identified, Emergency Drugs available, Suction available and Patient being monitored Patient Re-evaluated:Patient Re-evaluated prior to inductionOxygen Delivery Method: Circle System Utilized and Circle system utilized Preoxygenation: Pre-oxygenation with 100% oxygen Intubation Type: IV induction and Cricoid Pressure applied Ventilation: Mask ventilation without difficulty Laryngoscope Size: Mac and 4 Grade View: Grade I Tube type: Oral Tube size: 7.5 mm Number of attempts: 1 Airway Equipment and Method: Stylet and Oral airway Placement Confirmation: ETT inserted through vocal cords under direct vision,  positive ETCO2 and breath sounds checked- equal and bilateral Secured at: 23 cm Tube secured with: Tape Dental Injury: Teeth and Oropharynx as per pre-operative assessment

## 2016-06-23 NOTE — Brief Op Note (Signed)
06/23/2016  2:00 PM  PATIENT:  Travis Huff  56 y.o. male  PRE-OPERATIVE DIAGNOSIS:  Thoracolumbar scoliosis with spondylolisthesis, stenosis, lumbago, radiculopathy, sagittal imbalance  POST-OPERATIVE DIAGNOSIS:   Thoracolumbar scoliosis with spondylolisthesis, stenosis, lumbago, radiculopathy, sagittal imbalance  PROCEDURE:  Procedure(s): Lumbar five -sacral one  Anterior lumbar interbody fusion with Dr. Sherren Mocha Early for approach (N/A) Right  Lumbar two-three Lumbar three-four Lumbar four-five Anterolateral lumbar interbody fusion (Right) ABDOMINAL EXPOSURE (N/A)  SURGEON:  Surgeon(s) and Role: Panel 1:    Erline Levine, MD - Primary  Panel 2:    * Early, Arvilla Meres, MD - Primary  PHYSICIAN ASSISTANT:   ASSISTANTS: Poteat, RN   ANESTHESIA:   general  EBL:  Total I/O In: 3600 [I.V.:3100; IV Piggyback:500] Out: 1250 [Urine:1050; Blood:200]  BLOOD ADMINISTERED:none  DRAINS: none   LOCAL MEDICATIONS USED:  MARCAINE    and LIDOCAINE   SPECIMEN:  No Specimen  DISPOSITION OF SPECIMEN:  N/A  COUNTS:  YES  TOURNIQUET:  * No tourniquets in log *  DICTATION: DICTATION:   INDICATIONS:  Pateint is 56 year old male with chronic and intractable back and bilateral lower extremity pain with 25 degree coronal curve and sagittal imbalance. He has immobilizing intractable pain.   It was elected to take him to surgery for anterior lumbar decompression and fusion at the L 5 S1 level  and then to perform right sided XLIF at L 12, L 23, L 34, L 45 levels.  PROCEDURE:  Doctor Early performed exposure and his portion of the procedure will be dictated separately.  Upon exposing the L 5 S1 level, a localizing X ray was obtained with the C arm.  I then incised the anterior annulus and performed a thorough discectomy with wide ligamentous releases.  The endplates were cleared of disc and cartilagenous material and a thorough discectomy was performed with decompression of the ventral annulus and  disc material.  After trials, a 15 degree, 8 x 38 x 28 Base titanium Hyperlordotic ALIF cage was placed and lagged with 3, 5.5  mm screws, two in S 1 (17.5 mm length)  and one in L 5 22.5 mm in length. This spacer was packed with extra extra small BMP and Attrax bone graft substitute.  The implant was tamped into position and positioning was confirmed with C arm.   Locking mechanisms were engaged, soft tissues were inspected and found to be in good repair.  Fascia was closed with 1 PDS running stitch, skin edges closed with 2-0 and 3-0 vicryl sutures.  Wound was dressed with a sterile occlusive dressing.    Patient was then  placed in a left lateral decubitus position on the operative table and using orthogonally projected C-arm fluoroscopy the patient was placed so that the L 12, L 23, L 34, L 45 levels were visualized in AP and lateral plane. The patient was then taped into position.  Skin was marked along with a posterior finger dissection incision. His flank was then prepped and draped in usual sterile fashion and incisions were made sequentially at L 45 level. Posterior finger dissection was made to enter the retroperitoneal space and then subsequently the probe was inserted into the psoas muscle from the right side initially at the L45 level. After mapping the neural elements were able to dock the probe per the midpoint of this vertebral level and without indications electrically of too close proximity to the neural tissues. Subsequently the self-retaining tractor was.after sequential dilators were utilized  the shim was employed and the interspace was cleared of psoas muscle and then incised. A thorough discectomy was performed. Instruments were used to clear the interspace of disc material. After thorough discectomy was performed and this was performed using AP and lateral fluoroscopy a 12 lordotic by 60 x 22 mm x 15 degree implant was packed with small BMP and Attrax  This was tamped into position using  the slides and its position was confirmed on AP and lateral fluoroscopy.  Next, at the L 34 level,  we were able to dock the probe at the posterior aspect of this vertebral level and without indications electrically of too close proximity to the neural tissues. Subsequently the self-retaining tractor was.after sequential dilators were utilized the shim was employed and the interspace was cleared of psoas muscle and then incised. A thorough discectomy was performed. Instruments were used to clear the interspace of disc material. After thorough discectomy was performed and this was performed using AP and lateral fluoroscopy a 10 lordotic by 55 x 22 mm x 10 degree implant was packed in a similar fashion. This was tamped into position using the slides and its position was confirmed on AP and lateral fluoroscopy.We were able to operate on the L 23 level through the same incision as the L 34 level.   After mapping the neural elements were able to dock the probe per the posterior aspect of this vertebral level and without indications electrically of too close proximity to the neural tissues. Subsequently the self-retaining tractor was.after sequential dilators were utilized the shim was employed and the interspace was cleared of psoas muscle and then incised. A thorough discectomy was performed. Instruments were used to clear the interspace of disc material. After thorough discectomy was performed and this was performed using AP and lateral fluoroscopy a 10 lordotic by 55 x 22 mm x 10 lordotic implant was packed in a similar fashion. This was tamped into position using the slides and its position was confirmed on AP and lateral fluoroscopy.Hemostasis was assured the wounds were irrigated interrupted Vicryl sutures.  At this point, I elected not to operate at the L 12 level, as we were already at the fullest length of tools with 160 mm retractor.  I felt at this point that if the patient still needed decompression at the L  12 level, it would be safer to perform this at the second stage with posterior decompression.  All wounds were closed with 0, 2-0, and 3-0 Vicryl sutures.  Sterile occlusive dressing was placed with Dermabond. The patient was then extubated in the operating room and taken to recovery in stable and satisfactory condition having tolerated his operation well. Counts were correct at the end of the case.  Patient was extubated in the OR and taken to recovery having tolerated his surgery well.  Counts were correct.     PLAN OF CARE: Admit to inpatient   PATIENT DISPOSITION:  PACU - hemodynamically stable.   Delay start of Pharmacological VTE agent (>24hrs) due to surgical blood loss or risk of bleeding: yes  Retractor times:  L 45: 23:49; L 34: 21:36; L 23: 19:11.  Preop pelvic parameters:  PT: 21.4 PI: 41.2 LL:-29.4 PI-LL: 11.8 SVA: 101.5

## 2016-06-23 NOTE — Progress Notes (Signed)
Pt received from PACU with no noted distress. Neuro intact. Surgical honeycomb dressing sites clean, dry and intact. Pt oriented to room. Safety measures in place. Wife at bedside. Will continue to monitor.

## 2016-06-23 NOTE — Op Note (Signed)
06/23/2016  2:00 PM  PATIENT:  Travis Huff  56 y.o. male  PRE-OPERATIVE DIAGNOSIS:  Thoracolumbar scoliosis with spondylolisthesis, stenosis, lumbago, radiculopathy, sagittal imbalance  POST-OPERATIVE DIAGNOSIS:   Thoracolumbar scoliosis with spondylolisthesis, stenosis, lumbago, radiculopathy, sagittal imbalance  PROCEDURE:  Procedure(s): Lumbar five -sacral one  Anterior lumbar interbody fusion with Dr. Sherren Mocha Early for approach (N/A) Right  Lumbar two-three Lumbar three-four Lumbar four-five Anterolateral lumbar interbody fusion (Right) ABDOMINAL EXPOSURE (N/A)  SURGEON:  Surgeon(s) and Role: Panel 1:    Erline Levine, MD - Primary  Panel 2:    * Early, Arvilla Meres, MD - Primary  PHYSICIAN ASSISTANT:   ASSISTANTS: Poteat, RN   ANESTHESIA:   general  EBL:  Total I/O In: 3600 [I.V.:3100; IV Piggyback:500] Out: 1250 [Urine:1050; Blood:200]  BLOOD ADMINISTERED:none  DRAINS: none   LOCAL MEDICATIONS USED:  MARCAINE    and LIDOCAINE   SPECIMEN:  No Specimen  DISPOSITION OF SPECIMEN:  N/A  COUNTS:  YES  TOURNIQUET:  * No tourniquets in log *  DICTATION: DICTATION:   INDICATIONS:  Pateint is 56 year old male with chronic and intractable back and bilateral lower extremity pain with 25 degree coronal curve and sagittal imbalance. He has immobilizing intractable pain.   It was elected to take him to surgery for anterior lumbar decompression and fusion at the L 5 S1 level  and then to perform right sided XLIF at L 12, L 23, L 34, L 45 levels.  PROCEDURE:  Doctor Early performed exposure and his portion of the procedure will be dictated separately.  Upon exposing the L 5 S1 level, a localizing X ray was obtained with the C arm.  I then incised the anterior annulus and performed a thorough discectomy with wide ligamentous releases.  The endplates were cleared of disc and cartilagenous material and a thorough discectomy was performed with decompression of the ventral annulus and  disc material.  After trials, a 15 degree, 8 x 38 x 28 Base titanium Hyperlordotic ALIF cage was placed and lagged with 3, 5.5  mm screws, two in S 1 (17.5 mm length)  and one in L 5 22.5 mm in length. This spacer was packed with extra extra small BMP and Attrax bone graft substitute.  The implant was tamped into position and positioning was confirmed with C arm.   Locking mechanisms were engaged, soft tissues were inspected and found to be in good repair.  Fascia was closed with 1 PDS running stitch, skin edges closed with 2-0 and 3-0 vicryl sutures.  Wound was dressed with a sterile occlusive dressing.    Patient was then  placed in a left lateral decubitus position on the operative table and using orthogonally projected C-arm fluoroscopy the patient was placed so that the L 12, L 23, L 34, L 45 levels were visualized in AP and lateral plane. The patient was then taped into position.  Skin was marked along with a posterior finger dissection incision. His flank was then prepped and draped in usual sterile fashion and incisions were made sequentially at L 45 level. Posterior finger dissection was made to enter the retroperitoneal space and then subsequently the probe was inserted into the psoas muscle from the right side initially at the L45 level. After mapping the neural elements were able to dock the probe per the midpoint of this vertebral level and without indications electrically of too close proximity to the neural tissues. Subsequently the self-retaining tractor was.after sequential dilators were utilized  the shim was employed and the interspace was cleared of psoas muscle and then incised. A thorough discectomy was performed. Instruments were used to clear the interspace of disc material. After thorough discectomy was performed and this was performed using AP and lateral fluoroscopy a 12 lordotic by 60 x 22 mm x 15 degree implant was packed with small BMP and Attrax  This was tamped into position using  the slides and its position was confirmed on AP and lateral fluoroscopy.  Next, at the L 34 level,  we were able to dock the probe at the posterior aspect of this vertebral level and without indications electrically of too close proximity to the neural tissues. Subsequently the self-retaining tractor was.after sequential dilators were utilized the shim was employed and the interspace was cleared of psoas muscle and then incised. A thorough discectomy was performed. Instruments were used to clear the interspace of disc material. After thorough discectomy was performed and this was performed using AP and lateral fluoroscopy a 10 lordotic by 55 x 22 mm x 10 degree implant was packed in a similar fashion. This was tamped into position using the slides and its position was confirmed on AP and lateral fluoroscopy.We were able to operate on the L 23 level through the same incision as the L 34 level.   After mapping the neural elements were able to dock the probe per the posterior aspect of this vertebral level and without indications electrically of too close proximity to the neural tissues. Subsequently the self-retaining tractor was.after sequential dilators were utilized the shim was employed and the interspace was cleared of psoas muscle and then incised. A thorough discectomy was performed. Instruments were used to clear the interspace of disc material. After thorough discectomy was performed and this was performed using AP and lateral fluoroscopy a 10 lordotic by 55 x 22 mm x 10 lordotic implant was packed in a similar fashion. This was tamped into position using the slides and its position was confirmed on AP and lateral fluoroscopy.Hemostasis was assured the wounds were irrigated interrupted Vicryl sutures.  At this point, I elected not to operate at the L 12 level, as we were already at the fullest length of tools with 160 mm retractor.  I felt at this point that if the patient still needed decompression at the L  12 level, it would be safer to perform this at the second stage with posterior decompression.  All wounds were closed with 0, 2-0, and 3-0 Vicryl sutures.  Sterile occlusive dressing was placed with Dermabond. The patient was then extubated in the operating room and taken to recovery in stable and satisfactory condition having tolerated his operation well. Counts were correct at the end of the case.  Patient was extubated in the OR and taken to recovery having tolerated his surgery well.  Counts were correct.     PLAN OF CARE: Admit to inpatient   PATIENT DISPOSITION:  PACU - hemodynamically stable.   Delay start of Pharmacological VTE agent (>24hrs) due to surgical blood loss or risk of bleeding: yes  Retractor times:  L 45: 23:49; L 34: 21:36; L 23: 19:11.  Preop pelvic parameters:  PT: 21.4 PI: 41.2 LL:-29.4 PI-LL: 11.8 SVA: 101.5

## 2016-06-24 ENCOUNTER — Encounter (HOSPITAL_COMMUNITY): Payer: Self-pay | Admitting: General Practice

## 2016-06-24 ENCOUNTER — Inpatient Hospital Stay (HOSPITAL_COMMUNITY): Payer: 59

## 2016-06-24 MED ORDER — PANTOPRAZOLE SODIUM 40 MG PO TBEC
40.0000 mg | DELAYED_RELEASE_TABLET | Freq: Every day | ORAL | Status: DC
  Administered 2016-06-24 – 2016-06-28 (×5): 40 mg via ORAL
  Filled 2016-06-24 (×5): qty 1

## 2016-06-24 NOTE — Progress Notes (Signed)
Gave patient incentive spirometer, he ambulated 200 feet with TLSO brace and independent after set up.

## 2016-06-24 NOTE — Progress Notes (Signed)
Patient ID: Travis Huff, male   DOB: Mar 18, 1960, 56 y.o.   MRN: [id number] Subjective: Patient reports mild pain, some NT toes  Objective: Vital signs in last 24 hours: Temp:  [97.6 F (36.4 C)-98.4 F (36.9 C)] 98 F (36.7 C) (06/23 0525) Pulse Rate:  [39-90] 77 (06/23 0525) Resp:  [4-33] 18 (06/23 0525) BP: (95-186)/(47-124) 106/63 (06/23 0525) SpO2:  [92 %-100 %] 94 % (06/23 0525) Arterial Line BP: (116-210)/(87-145) 210/145 (06/22 1538) Weight:  [136.4 kg (300 lb 12.8 oz)] 136.4 kg (300 lb 12.8 oz) (06/22 1625)  Intake/Output from previous day: 06/22 0701 - 06/23 0700 In: 4823.9 [I.V.:4273.9; IV Piggyback:550] Out: 5050 [Urine:4800; Blood:250] Intake/Output this shift: No intake/output data recorded.  Neurologic: Grossly normal  Lab Results: Lab Results  Component Value Date   WBC 7.2 06/09/2016   HGB 16.3 06/09/2016   HCT 49.6 06/09/2016   MCV 96.3 06/09/2016   PLT 192 06/09/2016   Lab Results  Component Value Date   INR 1.01 05/14/2014   BMET Lab Results  Component Value Date   NA 137 06/09/2016   K 3.7 06/09/2016   CL 101 06/09/2016   CO2 26 06/09/2016   GLUCOSE 104 (H) 06/09/2016   BUN 15 06/09/2016   CREATININE 0.89 06/09/2016   CALCIUM 9.1 06/09/2016    Studies/Results: Dg Lumbar Spine 2-3 Views  Result Date: 06/23/2016 CLINICAL DATA:  Lumbar spine fusion surgery. EXAM: DG C-ARM GT 120 MIN; LUMBAR SPINE - 2-3 VIEW COMPARISON:  Lumbar spine MRI 05/13/2016 FLUOROSCOPY TIME:  C-arm fluoroscopic images were obtained intraoperatively and submitted for post operative interpretation. Please see the performing provider's procedural report for the fluoroscopy time utilized. FINDINGS: Four intraoperative spot fluoroscopic images of the lumbar spine are provided. The lowest fully formed intervertebral disc space is designated L5-S1 as on the prior MRI. The first 2 images demonstrate interval anterior interbody fusion at L5-S1. The second 2 images demonstrate  subsequent anterolateral interbody fusion at L2-3, L3-4, and L4-5. IMPRESSION: Intraoperative images during L2-S1 fusion. Electronically Signed   By: Logan Bores M.D.   On: 06/23/2016 13:41   Dg C-arm Gt 120 Min  Result Date: 06/23/2016 CLINICAL DATA:  Lumbar spine fusion surgery. EXAM: DG C-ARM GT 120 MIN; LUMBAR SPINE - 2-3 VIEW COMPARISON:  Lumbar spine MRI 05/13/2016 FLUOROSCOPY TIME:  C-arm fluoroscopic images were obtained intraoperatively and submitted for post operative interpretation. Please see the performing provider's procedural report for the fluoroscopy time utilized. FINDINGS: Four intraoperative spot fluoroscopic images of the lumbar spine are provided. The lowest fully formed intervertebral disc space is designated L5-S1 as on the prior MRI. The first 2 images demonstrate interval anterior interbody fusion at L5-S1. The second 2 images demonstrate subsequent anterolateral interbody fusion at L2-3, L3-4, and L4-5. IMPRESSION: Intraoperative images during L2-S1 fusion. Electronically Signed   By: Logan Bores M.D.   On: 06/23/2016 13:41   Dg Or Local Abdomen  Result Date: 06/23/2016 CLINICAL DATA:  Status post L5-S1 ALIF.  Foreign body check. EXAM: OR LOCAL ABDOMEN COMPARISON:  None. FINDINGS: Single portable intraoperative x-ray of the abdomen is provided. There is interval anterior lumbar interbody fusion at L5-S1. There is no radiopaque foreign body. There is no bowel dilatation to suggest obstruction. There is no evidence of pneumoperitoneum, portal venous gas or pneumatosis. There are no pathologic calcifications along the expected course of the ureters. The osseous structures are unremarkable. IMPRESSION: Interval anterior lumbar interbody fusion at L5-S1. No radiopaque foreign body. These results were called  by telephone at the time of interpretation on 06/23/2016 at 10:31 am to scrub nurse as Percival Spanish, who verbally acknowledged these results. Electronically Signed   By: Kathreen Devoid   On:  06/23/2016 10:33    Assessment/Plan: Doing well, uprights today, stage 2 monday   LOS: 1 day    Davelyn Gwinn S 06/24/2016, 7:36 AM

## 2016-06-24 NOTE — Evaluation (Signed)
Occupational Therapy Evaluation Patient Details Name: Travis Huff MRN: [id number] DOB: May 16, 1960 Today's Date: 06/24/2016    History of Present Illness Pt is a 56 y.o. male s/p L5-S1 ALIF and R L2-3, L3-4, L4-5 ALIF with abdominal exposure. He is scheduled for stage 2 of surgery 06/26/16. No pertinent past medical history per chart.   Clinical Impression   PTA, pt was independent with basic ADL tasks. He currently requires max assist for LB ADL, min assist for toilet transfers and standing grooming tasks, and mod assist for UB dressing tasks. Educated pt and his wife concerning brace wear schedule, back precautions during ADL, and compensatory strategies to adhere to these during ADL with handout provided. Additionally educated pt and wife on bed mobility techniques in preparation for ADL seated at EOB. He would benefit from continued OT services while admitted in order to maximize independence with ADL and functional mobility in preparation for return home with 24 hour assistance from his wife. Pt with stage 2 of surgery scheduled for Monday 06/26/16 and will update D/C recommendations as necessary following second stage. OT will continue to follow acutely.    Follow Up Recommendations  Supervision/Assistance - 24 hour;No OT follow up    Equipment Recommendations  3 in 1 bedside commode    Recommendations for Other Services       Precautions / Restrictions Precautions Precautions: Back Precaution Booklet Issued: Yes (comment) Precaution Comments: Reviewed back precautions during ADL.  Required Braces or Orthoses: Spinal Brace Spinal Brace: Thoracolumbosacral orthotic;Applied in sitting position (off to shower) Restrictions Weight Bearing Restrictions: No      Mobility Bed Mobility Overal bed mobility: Needs Assistance Bed Mobility: Rolling;Sidelying to Sit;Sit to Sidelying Rolling: Min guard Sidelying to sit: Min assist     Sit to sidelying: Min assist General bed  mobility comments: Min assist for trunk and to manage B LE. Completed multiple trials with verbal cues for log roll technique throughout.  Transfers Overall transfer level: Needs assistance Equipment used: Rolling walker (2 wheeled) Transfers: Sit to/from Stand Sit to Stand: Min assist         General transfer comment: Min lifting and steadying assistance with VC's for avoiding bending.     Balance Overall balance assessment: Needs assistance Sitting-balance support: No upper extremity supported;Feet supported Sitting balance-Leahy Scale: Fair     Standing balance support: Bilateral upper extremity supported;No upper extremity supported;During functional activity Standing balance-Leahy Scale: Poor Standing balance comment: Reliant on B UE support or external assistance to complete grooming tasks standing at sink.                            ADL either performed or assessed with clinical judgement   ADL Overall ADL's : Needs assistance/impaired Eating/Feeding: Set up;Sitting   Grooming: Minimal assistance;Oral care;Standing   Upper Body Bathing: Sitting;Minimal assistance   Lower Body Bathing: Maximal assistance;Sit to/from stand   Upper Body Dressing : Sitting;Moderate assistance Upper Body Dressing Details (indicate cue type and reason): Including brace Lower Body Dressing: Maximal assistance;Sit to/from stand   Toilet Transfer: Minimal assistance;Ambulation;RW;BSC   Toileting- Clothing Manipulation and Hygiene: Minimal assistance;Sit to/from stand       Functional mobility during ADLs: Minimal assistance;Rolling walker General ADL Comments: Pt educated on back precautions during ADL and dressing, bathing, and grooming techniques to adhere to back precautions including potential use of AE for LB ADL, use of wet-wipes for toileting, and 2 cup method for oral care  at sink. Pt and wife anxious concerning back precautions but demonstrate good understanding.       Vision Baseline Vision/History: No visual deficits Patient Visual Report: No change from baseline Vision Assessment?: No apparent visual deficits     Perception     Praxis      Pertinent Vitals/Pain Pain Assessment: Faces Faces Pain Scale: Hurts even more Pain Location: abdomen with mobility Pain Descriptors / Indicators: Aching;Operative site guarding;Sore Pain Intervention(s): Limited activity within patient's tolerance;Monitored during session;Repositioned     Hand Dominance     Extremity/Trunk Assessment Upper Extremity Assessment Upper Extremity Assessment: Overall WFL for tasks assessed   Lower Extremity Assessment Lower Extremity Assessment: Defer to PT evaluation   Cervical / Trunk Assessment Cervical / Trunk Assessment: Other exceptions Cervical / Trunk Exceptions: s/p spine surgery   Communication Communication Communication: No difficulties   Cognition Arousal/Alertness: Awake/alert Behavior During Therapy: WFL for tasks assessed/performed Overall Cognitive Status: Within Functional Limits for tasks assessed                                     General Comments  Pt's wife present and engaged in session.     Exercises     Shoulder Instructions      Home Living Family/patient expects to be discharged to:: Private residence Living Arrangements: Spouse/significant other Available Help at Discharge: Family;Available 24 hours/day Type of Home: House Home Access: Stairs to enter [employer] of Steps: 3 Entrance Stairs-Rails: Right;Left;Can reach both Home Layout: One level     Bathroom Shower/Tub: Teacher, early years/pre: Handicapped height     Home Equipment: Environmental consultant - 2 wheels          Prior Functioning/Environment Level of Independence: Independent                 OT Problem List: Decreased activity tolerance;Impaired balance (sitting and/or standing);Decreased safety awareness;Decreased  knowledge of use of DME or AE;Decreased knowledge of precautions;Pain      OT Treatment/Interventions: Self-care/ADL training;Therapeutic exercise;Energy conservation;DME and/or AE instruction;Therapeutic activities;Patient/family education;Balance training    OT Goals(Current goals can be found in the care plan section) Acute Rehab OT Goals Patient Stated Goal: not to hurt his back while recovering OT Goal Formulation: With patient/family Time For Goal Achievement: 07/08/16 Potential to Achieve Goals: Good ADL Goals Pt Will Perform Grooming: with supervision;standing Pt Will Perform Upper Body Dressing: sitting;with min assist;with caregiver independent in assisting Pt Will Perform Lower Body Dressing: with supervision;with caregiver independent in assisting;with adaptive equipment;sit to/from stand Pt Will Transfer to Toilet: with supervision;ambulating;bedside commode (BSC over toilet) Pt Will Perform Toileting - Clothing Manipulation and hygiene: with supervision;sitting/lateral leans;with caregiver independent in assisting;with adaptive equipment (with or without AE) Pt Will Perform Tub/Shower Transfer: Tub transfer;shower seat;ambulating;rolling walker;with min guard assist;with caregiver independent in assisting Additional ADL Goal #1: Pt and wife will verbalize 3/3 back precautions and adhere to these during morning ADL routine.   OT Frequency: Min 3X/week   Barriers to D/C:            Co-evaluation              AM-PAC PT "6 Clicks" Daily Activity     Outcome Measure Help from another person eating meals?: A Little Help from another person taking care of personal grooming?: A Little Help from another person toileting, which includes using toliet, bedpan, or urinal?: A Lot Help from  another person bathing (including washing, rinsing, drying)?: A Lot Help from another person to put on and taking off regular upper body clothing?: A Lot Help from another person to put on  and taking off regular lower body clothing?: A Lot 6 Click Score: 14   End of Session Equipment Utilized During Treatment: Rolling walker;Back brace Nurse Communication: Mobility status  Activity Tolerance: Patient tolerated treatment well Patient left: in bed;with call bell/phone within reach;with family/visitor present  OT Visit Diagnosis: Other abnormalities of gait and mobility (R26.89);Pain Pain - Right/Left:  (bilateral) Pain - part of body:  (back)                Time: 3729-0211 OT Time Calculation (min): 36 min Charges:  OT General Charges $OT Visit: 1 Procedure OT Evaluation $OT Eval Low Complexity: 1 Procedure OT Treatments $Self Care/Home Management : 8-22 mins G-Codes:     Norman Herrlich, MS OTR/L  Pager: [place] 06/24/2016, 1:28 PM

## 2016-06-24 NOTE — Progress Notes (Addendum)
  [facility] Progress Note  Subjective  - POD #1  Feels "great" this am. Some soreness at abdominal incision. Numbness and tingling in toes bilaterally that has improved from pre-op. Passing flatus.  Objective Vitals:   06/24/16 0049 06/24/16 0525  BP: (!) 144/83 106/63  Pulse: 79 77  Resp: 18 18  Temp: 98.1 F (36.7 C) 98 F (36.7 C)    Intake/Output Summary (Last 24 hours) at 06/24/16 0833 Last data filed at 06/24/16 0526  Gross per 24 hour  Intake          4723.88 ml  Output             4850 ml  Net          -126.12 ml   Abdomen soft and non tender.  Incision dressed. No surrounding erythema. Feet warm bilaterally with palpable DP pulses.   Assessment/Planning: 56 y.o. male is s/p: anterior exposure for L5-S1 disc fusion 1 Day Post-Op   Doing well post-op. Stable from vascular standpoint.  For stage 2 Monday.  Will sign off. Please call if we can assist.   Alvia Grove 06/24/2016 8:33 AM --  Laboratory CBC    Component Value Date/Time   WBC 7.2 06/09/2016 1533   HGB 16.3 06/09/2016 1533   HCT 49.6 06/09/2016 1533   PLT 192 06/09/2016 1533    BMET    Component Value Date/Time   NA 137 06/09/2016 1533   K 3.7 06/09/2016 1533   CL 101 06/09/2016 1533   CO2 26 06/09/2016 1533   GLUCOSE 104 (H) 06/09/2016 1533   BUN 15 06/09/2016 1533   CREATININE 0.89 06/09/2016 1533   CREATININE 0.77 04/01/2012 1357   CALCIUM 9.1 06/09/2016 1533   GFRNONAA >60 06/09/2016 1533   GFRAA >60 06/09/2016 1533    COAG Lab Results  Component Value Date   INR 1.01 05/14/2014   INR 0.99 12/21/2013   INR 0.96 05/23/2012   No results found for: PTT  Antibiotics Anti-infectives    Start     Dose/Rate Route Frequency Ordered Stop   06/26/16 0600  ceFAZolin (ANCEF) IVPB 2g/100 mL premix  Status:  Discontinued     2 g 200 mL/hr over 30 Minutes Intravenous On call to O.R. 06/23/16 [id number] 06/23/16 1613   06/23/16 2000  ceFAZolin (ANCEF) 3 g in  dextrose 5 % 50 mL IVPB     3 g 130 mL/hr over 30 Minutes Intravenous Every 8 hours 06/23/16 1629 06/24/16 0444   06/23/16 0715  ceFAZolin (ANCEF) 3 g in dextrose 5 % 50 mL IVPB     3 g 130 mL/hr over 30 Minutes Intravenous On call to O.R. 06/22/16 1309 06/23/16 [place], PA-C [facility] Office: [phone] Pager: [phone] 06/24/2016 8:33 AM  I have independently interviewed patient and agree with PA assessment and plan above.   Keyry Iracheta C. Donzetta Matters, MD [facility] [place] Office: [phone] Pager: [phone]

## 2016-06-24 NOTE — Evaluation (Signed)
Physical Therapy Evaluation Patient Details Name: Travis Huff MRN: [id number] DOB: September 18, 1960 Today's Date: 06/24/2016   History of Present Illness  Pt is a 56 y.o. male s/p L5-S1 ALIF and R L2-3, L3-4, L4-5 ALIF with abdominal exposure. He is scheduled for stage 2 of surgery 06/26/16. No pertinent past medical history per chart.  Clinical Impression  Pt presents with the above diagnosis and below deficits for therapy evaluation. Prior to admission, pt was completely independent and working full time. Pt lives at home with his wife who will be available at discharge. Due to recent OT visit, pt and wife are able to recall 3/3 back precautions and required only Min A for instruction to don back brace. Pt requires Min-Mod A for bed mobs and transfers this session and requires Min A to [place] guard to gait. Pt does become fatigued during last 20' of gait requiring assistance to increase. Pt will benefit from continued PT follow up acutely in order to address the below deficits prior to discharge.     Follow Up Recommendations Home health PT;Supervision for mobility/OOB    Equipment Recommendations  3in1 (PT)    Recommendations for Other Services       Precautions / Restrictions Precautions Precautions: Back Precaution Booklet Issued: Yes (comment) Precaution Comments: Pt and wife able to recall 3/3 precautions Required Braces or Orthoses: Spinal Brace Spinal Brace: Thoracolumbosacral orthotic;Applied in sitting position;Other (comment) Spinal Brace Comments: wife able to assist with donning brace with minimal cueing from clincian Restrictions Weight Bearing Restrictions: No      Mobility  Bed Mobility Overal bed mobility: Needs Assistance Bed Mobility: Rolling;Sidelying to Sit;Sit to Sidelying Rolling: Min guard Sidelying to sit: Min assist     Sit to sidelying: Min assist General bed mobility comments: MIn A for trunk and to manage Le's. Wife assisted this session    Transfers Overall transfer level: Needs assistance Equipment used: Rolling walker (2 wheeled) Transfers: Sit to/from Stand Sit to Stand: Min assist         General transfer comment: Min A for steadying once at RW. Cues for pushed up from surface sitting on  Ambulation/Gait Ambulation/Gait assistance: Min guard;Min assist Ambulation Distance (Feet): 150 Feet Assistive device: Rolling walker (2 wheeled) Gait Pattern/deviations: Step-through pattern;Decreased step length - right;Decreased step length - left;Decreased stride length;Antalgic Gait velocity: decreased Gait velocity interpretation: Below normal speed for age/gender General Gait Details: Mild antalgic gait initially progresses to Mod antalgia as distance increases. Pt requires Min A as distance increases due to fatigue.   Stairs            Wheelchair Mobility    Modified Rankin (Stroke Patients Only)       Balance Overall balance assessment: Needs assistance Sitting-balance support: No upper extremity supported;Feet supported Sitting balance-Leahy Scale: Fair     Standing balance support: Bilateral upper extremity supported;No upper extremity supported;During functional activity Standing balance-Leahy Scale: Poor Standing balance comment: Reliant on B UE support or external assistance to complete grooming tasks standing at sink.                              Pertinent Vitals/Pain Pain Assessment: 0-10 Pain Score: 5  Faces Pain Scale: Hurts even more Pain Location: abdomen with mobility Pain Descriptors / Indicators: Aching;Operative site guarding;Sore Pain Intervention(s): Monitored during session;Premedicated before session;Repositioned    Home Living Family/patient expects to be discharged to:: Private residence Living Arrangements: Spouse/significant other Available  Help at Discharge: Family;Available 24 hours/day Type of Home: House Home Access: Stairs to enter Entrance  Stairs-Rails: Right;Left;Can reach both Entrance Stairs-Number of Steps: 3 Home Layout: One level Home Equipment: Environmental consultant - 2 wheels      Prior Function Level of Independence: Independent         Comments: working full time     Hand Dominance   Dominant Hand: Right    Extremity/Trunk Assessment   Upper Extremity Assessment Upper Extremity Assessment: Defer to OT evaluation    Lower Extremity Assessment Lower Extremity Assessment: Generalized weakness    Cervical / Trunk Assessment Cervical / Trunk Assessment: Other exceptions Cervical / Trunk Exceptions: s/p spine surgery 1 of 2  Communication   Communication: No difficulties  Cognition Arousal/Alertness: Awake/alert Behavior During Therapy: WFL for tasks assessed/performed Overall Cognitive Status: Within Functional Limits for tasks assessed                                        General Comments General comments (skin integrity, edema, etc.): Wife present and assists throughout session    Exercises     Assessment/Plan    PT Assessment Patient needs continued PT services  PT Problem List Decreased strength;Decreased activity tolerance;Decreased balance;Decreased mobility;Decreased knowledge of use of DME;Pain       PT Treatment Interventions Gait training;Stair training;Functional mobility training;Therapeutic activities;Therapeutic exercise;DME instruction;Balance training;Patient/family education    PT Goals (Current goals can be found in the Care Plan section)  Acute Rehab PT Goals Patient Stated Goal: not to hurt his back while recovering PT Goal Formulation: With patient/family Time For Goal Achievement: 07/08/16 Potential to Achieve Goals: Good    Frequency Min 5X/week   Barriers to discharge        Co-evaluation               AM-PAC PT "6 Clicks" Daily Activity  Outcome Measure Difficulty turning over in bed (including adjusting bedclothes, sheets and blankets)?:  Total Difficulty moving from lying on back to sitting on the side of the bed? : Total Difficulty sitting down on and standing up from a chair with arms (e.g., wheelchair, bedside commode, etc,.)?: Total Help needed moving to and from a bed to chair (including a wheelchair)?: A Lot Help needed walking in hospital room?: A Lot Help needed climbing 3-5 steps with a railing? : A Lot 6 Click Score: 9    End of Session Equipment Utilized During Treatment: Back brace Activity Tolerance: Patient tolerated treatment well Patient left: in bed;with call bell/phone within reach;with family/visitor present Nurse Communication: Mobility status PT Visit Diagnosis: Muscle weakness (generalized) (M62.81);Other abnormalities of gait and mobility (R26.89);Pain Pain - Right/Left:  (anterior) Pain - part of body:  (andomen and back)    Time: 2549-8264 PT Time Calculation (min) (ACUTE ONLY): 17 min   Charges:   PT Evaluation $PT Eval Moderate Complexity: 1 Procedure     PT G Codes:         Scheryl Marten PT, DPT  [phone]   Shanon Rosser 06/24/2016, 1:47 PM

## 2016-06-25 NOTE — Progress Notes (Signed)
Patient resting in bed, alert oriented.  Medicated for back pain with effective results.  No noted discomfort, family at bedside.

## 2016-06-25 NOTE — Progress Notes (Signed)
Subjective: Patient reports Patient doing well condition of back pain but manageable  Objective: Vital signs in last 24 hours: Temp:  [98.2 F (36.8 C)-99.3 F (37.4 C)] 98.6 F (37 C) (06/24 0503) Pulse Rate:  [73-85] 73 (06/24 0503) Resp:  [18-20] 18 (06/24 0503) BP: (93-137)/(51-78) 93/51 (06/24 0503) SpO2:  [98 %-99 %] 99 % (06/24 0503)  Intake/Output from previous day: 06/23 0701 - 06/24 0700 In: 720 [P.O.:720] Out: -  Intake/Output this shift: No intake/output data recorded.  strength out of 5 wound clean dry and intact  Lab Results: No results for input(s): WBC, HGB, HCT, PLT in the last 72 hours. BMET No results for input(s): NA, K, CL, CO2, GLUCOSE, BUN, CREATININE, CALCIUM in the last 72 hours.  Studies/Results: Dg Lumbar Spine 2-3 Views  Result Date: 06/23/2016 CLINICAL DATA:  Lumbar spine fusion surgery. EXAM: DG C-ARM GT 120 MIN; LUMBAR SPINE - 2-3 VIEW COMPARISON:  Lumbar spine MRI 05/13/2016 FLUOROSCOPY TIME:  C-arm fluoroscopic images were obtained intraoperatively and submitted for post operative interpretation. Please see the performing provider's procedural report for the fluoroscopy time utilized. FINDINGS: Four intraoperative spot fluoroscopic images of the lumbar spine are provided. The lowest fully formed intervertebral disc space is designated L5-S1 as on the prior MRI. The first 2 images demonstrate interval anterior interbody fusion at L5-S1. The second 2 images demonstrate subsequent anterolateral interbody fusion at L2-3, L3-4, and L4-5. IMPRESSION: Intraoperative images during L2-S1 fusion. Electronically Signed   By: Logan Bores M.D.   On: 06/23/2016 13:41   Dg C-arm Gt 120 Min  Result Date: 06/23/2016 CLINICAL DATA:  Lumbar spine fusion surgery. EXAM: DG C-ARM GT 120 MIN; LUMBAR SPINE - 2-3 VIEW COMPARISON:  Lumbar spine MRI 05/13/2016 FLUOROSCOPY TIME:  C-arm fluoroscopic images were obtained intraoperatively and submitted for post operative  interpretation. Please see the performing provider's procedural report for the fluoroscopy time utilized. FINDINGS: Four intraoperative spot fluoroscopic images of the lumbar spine are provided. The lowest fully formed intervertebral disc space is designated L5-S1 as on the prior MRI. The first 2 images demonstrate interval anterior interbody fusion at L5-S1. The second 2 images demonstrate subsequent anterolateral interbody fusion at L2-3, L3-4, and L4-5. IMPRESSION: Intraoperative images during L2-S1 fusion. Electronically Signed   By: Logan Bores M.D.   On: 06/23/2016 13:41   Dg Scoliosis Eval Complete Spine 2 Or 3 Views  Result Date: 06/24/2016 CLINICAL DATA:  Idiopathic scoliosis with surgery yesterday. L5-S1 fusion L2-3, L3-4 and L4-5 effusions. EXAM: DG SCOLIOSIS EVAL COMPLETE SPINE 2-3V COMPARISON:  Lumbar spine 05/01/2016 and thoracolumbar spine 04/06/2014 FINDINGS: Exam demonstrates mild biphasic curvature of the thoracolumbar spine with primary curvature of the thoracic spine convex right and secondary curvature of the lumbar spine convex left. Angle of curvature from T6-T12 is 11 degrees. Angle of curvature of the lumbar spine from superior endplate of W25 to inferior endplate of L4 is 20 degrees. There is mild degenerative change throughout the spine. Evidence of patient's recent interbody fusion at the L2-3, L3-4 L4-5 levels. Evidence of previous L5-S1 fusion. No significant compression fracture right hip arthroplasty is present. Mild degenerate change of the left hip. IMPRESSION: Mild biphasic curvature of the thoracolumbar spine with primary curvature of the thoracic spine convex right measuring 11 degrees from T6-T12. Secondary curvature of the lumbar spine convex left with angle of curvature 20 degrees from T12-L4. Mild spondylosis throughout the spine. Electronically Signed   By: Marin Olp M.D.   On: 06/24/2016 14:59  Dg Or Local Abdomen  Result Date: 06/23/2016 CLINICAL DATA:   Status post L5-S1 ALIF.  Foreign body check. EXAM: OR LOCAL ABDOMEN COMPARISON:  None. FINDINGS: Single portable intraoperative x-ray of the abdomen is provided. There is interval anterior lumbar interbody fusion at L5-S1. There is no radiopaque foreign body. There is no bowel dilatation to suggest obstruction. There is no evidence of pneumoperitoneum, portal venous gas or pneumatosis. There are no pathologic calcifications along the expected course of the ureters. The osseous structures are unremarkable. IMPRESSION: Interval anterior lumbar interbody fusion at L5-S1. No radiopaque foreign body. These results were called by telephone at the time of interpretation on 06/23/2016 at 10:31 am to scrub nurse as Percival Spanish, who verbally acknowledged these results. Electronically Signed   By: Kathreen Devoid   On: 06/23/2016 10:33    Assessment/Plan: Stage I procedure postop day 2 doing very well prepped for stage II tomorrow patient will be nothing by mouth after midnight. Scoliosis films show excellent correction of lumbar deformity.  LOS: 2 days     Keidan Aumiller P 06/25/2016, 9:09 AM

## 2016-06-25 NOTE — Progress Notes (Signed)
Physical Therapy Treatment Patient Details Name: Travis Huff MRN: [id number] DOB: 07-13-1960 Today's Date: 06/25/2016    History of Present Illness Pt is a 56 y.o. male s/p L5-S1 ALIF and R L2-3, L3-4, L4-5 ALIF with abdominal exposure. He is scheduled for stage 2 of surgery 06/26/16. No pertinent past medical history per chart.    PT Comments    Patient progressing well and pt/wife asking appropriate questions. He is scheduled for stage 2 of surgery tomorrow 6/25. Will resume PT 6/26 as appropriate.    Follow Up Recommendations  Home health PT;Supervision for mobility/OOB (to determine after 2nd surgery)     Equipment Recommendations  3in1 (PT)    Recommendations for Other Services       Precautions / Restrictions Precautions Precautions: Back Precaution Booklet Issued: Yes (comment) Precaution Comments: Pt and wife able to recall 3/3 precautions Required Braces or Orthoses: Spinal Brace Spinal Brace: Thoracolumbosacral orthotic;Applied in sitting position Restrictions Weight Bearing Restrictions: No    Mobility  Bed Mobility Overal bed mobility: Needs Assistance Bed Mobility: Rolling;Sidelying to Sit;Sit to Sidelying Rolling: Min guard Sidelying to sit: Min assist (of wife)     Sit to sidelying: Min assist (of wife) General bed mobility comments: MIn A for trunk and to manage Le's. Wife assisted this session ; educated wife on reaching under pt's ribs to help elevate torso vs pulling on his neck  Transfers Overall transfer level: Needs assistance Equipment used: Rolling walker (2 wheeled) Transfers: Sit to/from Stand Sit to Stand: Min guard         General transfer comment: vc for safe use of RW  Ambulation/Gait Ambulation/Gait assistance: Min guard Ambulation Distance (Feet): 400 Feet Assistive device: Rolling walker (2 wheeled) Gait Pattern/deviations: Step-through pattern;Decreased step length - right;Decreased step length - left;Decreased stride  length Gait velocity: decreased Gait velocity interpretation: Below normal speed for age/gender General Gait Details: vc to stay closer to RW to prevent flexion   Stairs            Wheelchair Mobility    Modified Rankin (Stroke Patients Only)       Balance Overall balance assessment: Needs assistance Sitting-balance support: No upper extremity supported;Feet supported Sitting balance-Leahy Scale: Fair     Standing balance support: No upper extremity supported;During functional activity Standing balance-Leahy Scale: Fair                              Cognition Arousal/Alertness: Awake/alert Behavior During Therapy: WFL for tasks assessed/performed Overall Cognitive Status: Within Functional Limits for tasks assessed                                        Exercises      General Comments General comments (skin integrity, edema, etc.): Wife present and assiting pt appropriately with bed mobility (she requested PT observe her technique with minor change recommended)      Pertinent Vitals/Pain Pain Assessment: 0-10 Pain Score: 7  Pain Location: abdomen with mobility Pain Descriptors / Indicators: Aching;Operative site guarding;Sore Pain Intervention(s): Limited activity within patient's tolerance;Monitored during session;Premedicated before session;Repositioned    Home Living                      Prior Function            PT Goals (current goals can now  be found in the care plan section) Acute Rehab PT Goals Patient Stated Goal: not to hurt his back while recovering PT Goal Formulation: With patient/family Time For Goal Achievement: 07/08/16 Potential to Achieve Goals: Good Progress towards PT goals: Progressing toward goals    Frequency    Min 5X/week      PT Plan Current plan remains appropriate    Co-evaluation              AM-PAC PT "6 Clicks" Daily Activity  Outcome Measure  Difficulty turning  over in bed (including adjusting bedclothes, sheets and blankets)?: A Lot Difficulty moving from lying on back to sitting on the side of the bed? : A Lot Difficulty sitting down on and standing up from a chair with arms (e.g., wheelchair, bedside commode, etc,.)?: A Little Help needed moving to and from a bed to chair (including a wheelchair)?: A Little Help needed walking in hospital room?: A Little Help needed climbing 3-5 steps with a railing? : A Lot 6 Click Score: 15    End of Session Equipment Utilized During Treatment: Back brace Activity Tolerance: Patient tolerated treatment well Patient left: with call bell/phone within reach;with family/visitor present;in chair Nurse Communication: Mobility status PT Visit Diagnosis: Muscle weakness (generalized) (M62.81);Other abnormalities of gait and mobility (R26.89);Pain Pain - Right/Left:  (anterior) Pain - part of body:  (andomen and back)     Time: 2751-7001 PT Time Calculation (min) (ACUTE ONLY): 21 min  Charges:  $Gait Training: 8-22 mins                    G Codes:          [employer], PT 06/25/2016, 9:48 AM

## 2016-06-26 ENCOUNTER — Inpatient Hospital Stay (HOSPITAL_COMMUNITY): Payer: 59 | Admitting: Certified Registered Nurse Anesthetist

## 2016-06-26 ENCOUNTER — Encounter (HOSPITAL_COMMUNITY): Payer: Self-pay | Admitting: Neurosurgery

## 2016-06-26 ENCOUNTER — Inpatient Hospital Stay (HOSPITAL_COMMUNITY): Admission: RE | Admit: 2016-06-26 | Payer: 59 | Source: Ambulatory Visit | Admitting: Neurosurgery

## 2016-06-26 ENCOUNTER — Inpatient Hospital Stay (HOSPITAL_COMMUNITY)
Admission: RE | Disposition: A | Discharge status: Home / Self Care | Payer: Self-pay | Source: Ambulatory Visit | Attending: Neurosurgery

## 2016-06-26 HISTORY — PX: POSTERIOR LUMBAR FUSION 4 LEVEL: SHX6037

## 2016-06-26 HISTORY — PX: APPLICATION OF INTRAOPERATIVE CT SCAN: SHX6668

## 2016-06-26 LAB — CBC
HCT: 32.9 % — ABNORMAL LOW (ref 39.0–52.0)
Hemoglobin: 10.6 g/dL — ABNORMAL LOW (ref 13.0–17.0)
MCH: 30.7 pg (ref 26.0–34.0)
MCHC: 32.2 g/dL (ref 30.0–36.0)
MCV: 95.4 fL (ref 78.0–100.0)
Platelets: 155 10*3/uL (ref 150–400)
RBC: 3.45 MIL/uL — ABNORMAL LOW (ref 4.22–5.81)
RDW: 13 % (ref 11.5–15.5)
WBC: 10.5 10*3/uL (ref 4.0–10.5)

## 2016-06-26 SURGERY — POSTERIOR LUMBAR FUSION 4 LEVEL
Anesthesia: General | Site: Back

## 2016-06-26 MED ORDER — FENTANYL CITRATE (PF) 250 MCG/5ML IJ SOLN
INTRAMUSCULAR | Status: AC
  Filled 2016-06-26: qty 5

## 2016-06-26 MED ORDER — THROMBIN 5000 UNITS EX SOLR
OROMUCOSAL | Status: DC | PRN
  Administered 2016-06-26: 5 mL via TOPICAL

## 2016-06-26 MED ORDER — MIDAZOLAM HCL 5 MG/5ML IJ SOLN
INTRAMUSCULAR | Status: DC | PRN
  Administered 2016-06-26: 2 mg via INTRAVENOUS

## 2016-06-26 MED ORDER — PROPOFOL 10 MG/ML IV BOLUS
INTRAVENOUS | Status: AC
  Filled 2016-06-26: qty 40

## 2016-06-26 MED ORDER — FENTANYL CITRATE (PF) 100 MCG/2ML IJ SOLN
INTRAMUSCULAR | Status: AC
  Filled 2016-06-26: qty 2

## 2016-06-26 MED ORDER — THROMBIN 20000 UNITS EX SOLR
CUTANEOUS | Status: AC
  Filled 2016-06-26: qty 20000

## 2016-06-26 MED ORDER — ACETAMINOPHEN 10 MG/ML IV SOLN
INTRAVENOUS | Status: DC | PRN
  Administered 2016-06-26: 1000 mg via INTRAVENOUS

## 2016-06-26 MED ORDER — PROPOFOL 10 MG/ML IV BOLUS
INTRAVENOUS | Status: DC | PRN
  Administered 2016-06-26: 180 mg via INTRAVENOUS

## 2016-06-26 MED ORDER — LACTATED RINGERS IV SOLN
INTRAVENOUS | Status: DC | PRN
  Administered 2016-06-26 (×3): via INTRAVENOUS

## 2016-06-26 MED ORDER — ALBUMIN HUMAN 5 % IV SOLN
INTRAVENOUS | Status: DC | PRN
  Administered 2016-06-26 (×2): via INTRAVENOUS

## 2016-06-26 MED ORDER — LIDOCAINE-EPINEPHRINE 1 %-1:100000 IJ SOLN
INTRAMUSCULAR | Status: DC | PRN
  Administered 2016-06-26: 5 mL

## 2016-06-26 MED ORDER — SODIUM CHLORIDE 0.9 % IV BOLUS (SEPSIS)
500.0000 mL | Freq: Once | INTRAVENOUS | Status: AC
  Administered 2016-06-26: 500 mL via INTRAVENOUS

## 2016-06-26 MED ORDER — EPHEDRINE SULFATE 50 MG/ML IJ SOLN
INTRAMUSCULAR | Status: DC | PRN
  Administered 2016-06-26 (×2): 10 mg via INTRAVENOUS

## 2016-06-26 MED ORDER — SODIUM CHLORIDE 0.9 % IJ SOLN
INTRAMUSCULAR | Status: DC | PRN
  Administered 2016-06-26 (×2): 10 mL

## 2016-06-26 MED ORDER — PHENYLEPHRINE HCL 10 MG/ML IJ SOLN
INTRAMUSCULAR | Status: DC | PRN
  Administered 2016-06-26: 80 ug via INTRAVENOUS
  Administered 2016-06-26: 120 ug via INTRAVENOUS
  Administered 2016-06-26: 80 ug via INTRAVENOUS
  Administered 2016-06-26: 120 ug via INTRAVENOUS

## 2016-06-26 MED ORDER — BUPIVACAINE LIPOSOME 1.3 % IJ SUSP
20.0000 mL | INTRAMUSCULAR | Status: AC
  Administered 2016-06-26: 20 mL
  Filled 2016-06-26 (×2): qty 20

## 2016-06-26 MED ORDER — DEXTROSE 5 % IV SOLN
INTRAVENOUS | Status: DC | PRN
  Administered 2016-06-26: 20 ug/min via INTRAVENOUS
  Administered 2016-06-26: 60 ug/min via INTRAVENOUS

## 2016-06-26 MED ORDER — DEXMEDETOMIDINE HCL IN NACL 400 MCG/100ML IV SOLN
INTRAVENOUS | Status: DC | PRN
Start: 2016-06-26 — End: 2016-06-26
  Administered 2016-06-26: .4 ug/kg/h via INTRAVENOUS

## 2016-06-26 MED ORDER — FENTANYL CITRATE (PF) 100 MCG/2ML IJ SOLN
25.0000 ug | INTRAMUSCULAR | Status: DC | PRN
  Administered 2016-06-26 (×2): 50 ug via INTRAVENOUS

## 2016-06-26 MED ORDER — MIDAZOLAM HCL 2 MG/2ML IJ SOLN
INTRAMUSCULAR | Status: AC
  Filled 2016-06-26: qty 2

## 2016-06-26 MED ORDER — BUPIVACAINE HCL (PF) 0.5 % IJ SOLN
INTRAMUSCULAR | Status: DC | PRN
  Administered 2016-06-26: 5 mL

## 2016-06-26 MED ORDER — ONDANSETRON HCL 4 MG/2ML IJ SOLN: 4.0000 mg | Freq: Once | INTRAMUSCULAR | Status: DC | PRN

## 2016-06-26 MED ORDER — ACETAMINOPHEN 10 MG/ML IV SOLN
INTRAVENOUS | Status: AC
  Filled 2016-06-26: qty 100

## 2016-06-26 MED ORDER — SUGAMMADEX SODIUM 500 MG/5ML IV SOLN
INTRAVENOUS | Status: DC | PRN
  Administered 2016-06-26: 200 mg via INTRAVENOUS

## 2016-06-26 MED ORDER — LIDOCAINE-EPINEPHRINE 1 %-1:100000 IJ SOLN
INTRAMUSCULAR | Status: AC
  Filled 2016-06-26: qty 1

## 2016-06-26 MED ORDER — THROMBIN 20000 UNITS EX SOLR
CUTANEOUS | Status: DC | PRN
  Administered 2016-06-26: 20 mL via TOPICAL

## 2016-06-26 MED ORDER — LIDOCAINE HCL (CARDIAC) 20 MG/ML IV SOLN
INTRAVENOUS | Status: DC | PRN
  Administered 2016-06-26: 60 mg via INTRAVENOUS

## 2016-06-26 MED ORDER — 0.9 % SODIUM CHLORIDE (POUR BTL) OPTIME
TOPICAL | Status: DC | PRN
  Administered 2016-06-26: 1000 mL

## 2016-06-26 MED ORDER — ROCURONIUM BROMIDE 100 MG/10ML IV SOLN
INTRAVENOUS | Status: DC | PRN
  Administered 2016-06-26 (×3): 50 mg via INTRAVENOUS
  Administered 2016-06-26 (×4): 10 mg via INTRAVENOUS

## 2016-06-26 MED ORDER — THROMBIN 5000 UNITS EX SOLR
CUTANEOUS | Status: AC
  Filled 2016-06-26: qty 5000

## 2016-06-26 MED ORDER — VANCOMYCIN HCL 1000 MG IV SOLR
INTRAVENOUS | Status: DC | PRN
  Administered 2016-06-26: 1000 mg

## 2016-06-26 MED ORDER — TRANEXAMIC ACID 1000 MG/10ML IV SOLN
1000.0000 mg | INTRAVENOUS | Status: AC
  Administered 2016-06-26: 1000 mg via INTRAVENOUS
  Filled 2016-06-26: qty 10

## 2016-06-26 MED ORDER — KETAMINE HCL 10 MG/ML IJ SOLN
INTRAMUSCULAR | Status: DC | PRN
  Administered 2016-06-26: 50 mg via INTRAVENOUS
  Administered 2016-06-26 (×5): 10 mg via INTRAVENOUS

## 2016-06-26 MED ORDER — VANCOMYCIN HCL 1000 MG IV SOLR
INTRAVENOUS | Status: AC
  Filled 2016-06-26: qty 1000

## 2016-06-26 MED ORDER — CEFAZOLIN SODIUM-DEXTROSE 2-3 GM-% IV SOLR
INTRAVENOUS | Status: DC | PRN
  Administered 2016-06-26 (×2): 3 g via INTRAVENOUS

## 2016-06-26 MED ORDER — FENTANYL CITRATE (PF) 100 MCG/2ML IJ SOLN
INTRAMUSCULAR | Status: DC | PRN
  Administered 2016-06-26: 50 ug via INTRAVENOUS
  Administered 2016-06-26: 25 ug via INTRAVENOUS
  Administered 2016-06-26: 150 ug via INTRAVENOUS
  Administered 2016-06-26: 50 ug via INTRAVENOUS
  Administered 2016-06-26: 25 ug via INTRAVENOUS
  Administered 2016-06-26 (×3): 50 ug via INTRAVENOUS
  Administered 2016-06-26: 25 ug via INTRAVENOUS
  Administered 2016-06-26 (×2): 50 ug via INTRAVENOUS
  Administered 2016-06-26: 25 ug via INTRAVENOUS

## 2016-06-26 MED ORDER — ONDANSETRON HCL 4 MG/2ML IJ SOLN
INTRAMUSCULAR | Status: DC | PRN
  Administered 2016-06-26: 4 mg via INTRAVENOUS

## 2016-06-26 MED ORDER — SODIUM CHLORIDE 0.9 % IJ SOLN
INTRAMUSCULAR | Status: AC
  Filled 2016-06-26: qty 20

## 2016-06-26 MED ORDER — CEFAZOLIN SODIUM 1 G IJ SOLR
INTRAMUSCULAR | Status: AC
  Filled 2016-06-26: qty 60

## 2016-06-26 MED ORDER — BUPIVACAINE HCL (PF) 0.5 % IJ SOLN
INTRAMUSCULAR | Status: AC
  Filled 2016-06-26: qty 30

## 2016-06-26 MED ORDER — LACTATED RINGERS IV SOLN
INTRAVENOUS | Status: DC | PRN
  Administered 2016-06-26 (×2): via INTRAVENOUS

## 2016-06-26 MED FILL — Sodium Chloride IV Soln 0.9%: INTRAVENOUS | Qty: 2000 | Status: AC

## 2016-06-26 MED FILL — Heparin Sodium (Porcine) Inj 1000 Unit/ML: INTRAMUSCULAR | Qty: 30 | Status: AC

## 2016-06-26 SURGICAL SUPPLY — 94 items
BASKET BONE COLLECTION (BASKET) ×3 IMPLANT
BLADE CLIPPER SURG (BLADE) IMPLANT
BONE CANC CHIPS 40CC CAN1/2 (Bone Implant) ×6 IMPLANT
BUR MATCHSTICK NEURO 3.0 LAGG (BURR) ×3 IMPLANT
BUR PRECISION FLUTE 5.0 (BURR) ×3 IMPLANT
CANISTER SUCT 3000ML PPV (MISCELLANEOUS) ×3 IMPLANT
CARTRIDGE OIL MAESTRO DRILL (MISCELLANEOUS) ×2 IMPLANT
CHIPS CANC BONE 40CC CAN1/2 (Bone Implant) ×2 IMPLANT
CONNECTOR RELINE 30MM OPEN OFF (Connector) ×6 IMPLANT
CONT SPEC 4OZ CLIKSEAL STRL BL (MISCELLANEOUS) ×3 IMPLANT
COVER BACK TABLE 60X90IN (DRAPES) ×6 IMPLANT
DECANTER SPIKE VIAL GLASS SM (MISCELLANEOUS) ×3 IMPLANT
DERMABOND ADVANCED (GAUZE/BANDAGES/DRESSINGS) ×6
DERMABOND ADVANCED .7 DNX12 (GAUZE/BANDAGES/DRESSINGS) ×3 IMPLANT
DIFFUSER DRILL AIR PNEUMATIC (MISCELLANEOUS) ×3 IMPLANT
DIGITIZER BENDINI (MISCELLANEOUS) ×3 IMPLANT
DRAPE C-ARM 42X72 X-RAY (DRAPES) ×3 IMPLANT
DRAPE C-ARMOR (DRAPES) ×3 IMPLANT
DRAPE LAPAROTOMY 100X72X124 (DRAPES) ×3 IMPLANT
DRAPE POUCH INSTRU U-SHP 10X18 (DRAPES) ×9 IMPLANT
DRAPE SCAN PATIENT (DRAPES) ×12 IMPLANT
DRAPE SURG 17X23 STRL (DRAPES) ×3 IMPLANT
DRSG OPSITE POSTOP 3X4 (GAUZE/BANDAGES/DRESSINGS) ×3 IMPLANT
DRSG OPSITE POSTOP 4X10 (GAUZE/BANDAGES/DRESSINGS) ×6 IMPLANT
DURAPREP 26ML APPLICATOR (WOUND CARE) ×3 IMPLANT
ELECT BLADE 4.0 EZ CLEAN MEGAD (MISCELLANEOUS) ×6
ELECT REM PT RETURN 9FT ADLT (ELECTROSURGICAL) ×3
ELECTRODE BLDE 4.0 EZ CLN MEGD (MISCELLANEOUS) ×2 IMPLANT
ELECTRODE REM PT RTRN 9FT ADLT (ELECTROSURGICAL) ×1 IMPLANT
EVACUATOR 1/8 PVC DRAIN (DRAIN) ×3 IMPLANT
GAUZE SPONGE 4X4 12PLY STRL (GAUZE/BANDAGES/DRESSINGS) ×3 IMPLANT
GAUZE SPONGE 4X4 16PLY XRAY LF (GAUZE/BANDAGES/DRESSINGS) ×6 IMPLANT
GLOVE BIO SURGEON STRL SZ8 (GLOVE) ×18 IMPLANT
GLOVE BIOGEL PI IND STRL 8 (GLOVE) ×5 IMPLANT
GLOVE BIOGEL PI IND STRL 8.5 (GLOVE) ×5 IMPLANT
GLOVE BIOGEL PI INDICATOR 8 (GLOVE) ×10
GLOVE BIOGEL PI INDICATOR 8.5 (GLOVE) ×10
GLOVE ECLIPSE 7.0 STRL STRAW (GLOVE) ×6 IMPLANT
GLOVE ECLIPSE 7.5 STRL STRAW (GLOVE) ×3 IMPLANT
GLOVE ECLIPSE 8.0 STRL XLNG CF (GLOVE) ×21 IMPLANT
GLOVE EXAM NITRILE LRG STRL (GLOVE) IMPLANT
GLOVE EXAM NITRILE XL STR (GLOVE) IMPLANT
GLOVE EXAM NITRILE XS STR PU (GLOVE) IMPLANT
GLOVE INDICATOR 7.0 STRL GRN (GLOVE) ×9 IMPLANT
GLOVE INDICATOR 7.5 STRL GRN (GLOVE) ×18 IMPLANT
GLOVE SURG SS PI 7.0 STRL IVOR (GLOVE) ×6 IMPLANT
GOWN STRL REUS W/ TWL LRG LVL3 (GOWN DISPOSABLE) ×2 IMPLANT
GOWN STRL REUS W/ TWL XL LVL3 (GOWN DISPOSABLE) ×4 IMPLANT
GOWN STRL REUS W/TWL 2XL LVL3 (GOWN DISPOSABLE) ×18 IMPLANT
GOWN STRL REUS W/TWL LRG LVL3 (GOWN DISPOSABLE) ×4
GOWN STRL REUS W/TWL XL LVL3 (GOWN DISPOSABLE) ×8
KIT BASIN OR (CUSTOM PROCEDURE TRAY) ×3 IMPLANT
KIT POSITION SURG JACKSON T1 (MISCELLANEOUS) ×3 IMPLANT
KIT ROOM TURNOVER OR (KITS) ×3 IMPLANT
MARKER SPHERE PSV REFLC 13MM (MARKER) ×18 IMPLANT
MILL MEDIUM DISP (BLADE) ×3 IMPLANT
MODULE POWER NUVASIVE (MISCELLANEOUS) ×1 IMPLANT
NEEDLE HYPO 21X1 ECLIPSE (NEEDLE) ×3 IMPLANT
NEEDLE HYPO 25GX1X1/2 BEV (NEEDLE) IMPLANT
NEEDLE HYPO 25X1 1.5 SAFETY (NEEDLE) ×3 IMPLANT
NEEDLE SPNL 18GX3.5 QUINCKE PK (NEEDLE) IMPLANT
NS IRRIG 1000ML POUR BTL (IV SOLUTION) ×3 IMPLANT
OIL CARTRIDGE MAESTRO DRILL (MISCELLANEOUS) ×6
PACK LAMINECTOMY NEURO (CUSTOM PROCEDURE TRAY) ×3 IMPLANT
PAD ARMBOARD 7.5X6 YLW CONV (MISCELLANEOUS) ×15 IMPLANT
PATTIES SURGICAL .5 X.5 (GAUZE/BANDAGES/DRESSINGS) IMPLANT
PATTIES SURGICAL .5 X1 (DISPOSABLE) IMPLANT
PATTIES SURGICAL 1X1 (DISPOSABLE) IMPLANT
PENCIL BUTTON HOLSTER BLD 10FT (ELECTRODE) ×9 IMPLANT
POWER MODULE NUVASIVE (MISCELLANEOUS) ×3
ROD RELINE-O 5.5X300 STRT NS (Rod) ×2 IMPLANT
ROD RELINE-O 5.5X300MM STRT (Rod) ×4 IMPLANT
SCREW LOCK RELINE 5.5 TULIP (Screw) ×60 IMPLANT
SCREW LOCK RELINE CLOSED TULIP (Screw) ×6 IMPLANT
SCREW RELINE ILIAC 8.5X70 2S (Screw) ×6 IMPLANT
SCREW RELINE-O POLY 5.5X45MM (Screw) ×9 IMPLANT
SCREW RELINE-O POLY 6.5X45 (Screw) ×30 IMPLANT
SCREW RELINE-O POLY 7.5X45 (Screw) ×12 IMPLANT
SPONGE LAP 4X18 X RAY DECT (DISPOSABLE) ×9 IMPLANT
SPONGE SURGIFOAM ABS GEL 100 (HEMOSTASIS) ×3 IMPLANT
STAPLER SKIN PROX WIDE 3.9 (STAPLE) ×3 IMPLANT
SUT VIC AB 0 CT1 18XCR BRD8 (SUTURE) ×2 IMPLANT
SUT VIC AB 0 CT1 8-18 (SUTURE) ×4
SUT VIC AB 1 CT1 18XBRD ANBCTR (SUTURE) ×5 IMPLANT
SUT VIC AB 1 CT1 8-18 (SUTURE) ×10
SUT VIC AB 2-0 CT1 18 (SUTURE) ×9 IMPLANT
SUT VIC AB 3-0 SH 8-18 (SUTURE) ×12 IMPLANT
SYR 5ML LL (SYRINGE) IMPLANT
SYRINGE 20CC LL (MISCELLANEOUS) ×3 IMPLANT
TOWEL GREEN STERILE (TOWEL DISPOSABLE) ×3 IMPLANT
TOWEL GREEN STERILE FF (TOWEL DISPOSABLE) ×3 IMPLANT
TRAP SPECIMEN MUCOUS 40CC (MISCELLANEOUS) ×3 IMPLANT
TRAY FOLEY W/METER SILVER 16FR (SET/KITS/TRAYS/PACK) ×3 IMPLANT
WATER STERILE IRR 1000ML POUR (IV SOLUTION) ×3 IMPLANT

## 2016-06-26 NOTE — Progress Notes (Signed)
Patient ID: Travis Huff, male   DOB: 1960/09/23, 56 y.o.   MRN: [id number] Awake, conversant, oriented. Anxious re: hallucinations earlier. Diaphoretic. BP 112/68 with HR 94, Sats 100% on room air. Good strength BLE. Pt reports incisional pain thoraco-lumbar region, no leg pain, with foot numbness bilaterally. 182ml from hemovac earlier.  Reassured.  NS bolus 510ml and CBC stat entered as ordered by Dr. [name].

## 2016-06-26 NOTE — Progress Notes (Signed)
Pt received from PACU stable, neuro intact. Pt diaphoretic complaining of chills.  Vital signs stable. Pt with intermittent cries with hallucinations. Report received from PACU. Wife at bedside.  Surgical honeycomb dressings dry and intact. Hemovac in place. Pt oriented to room. Safety measures in place. Call bell within reach.

## 2016-06-26 NOTE — Progress Notes (Signed)
Pt slept comfortably during shift. CHG bath completed x2; report called off to Anesthesia on 25981 in the OR. Pt transported off unit via bed to OR for procedure. P.[employer] RN

## 2016-06-26 NOTE — Brief Op Note (Signed)
06/23/2016 - 06/26/2016  2:56 PM  PATIENT:  Travis Huff  56 y.o. male  PRE-OPERATIVE DIAGNOSIS:  Scoliosis, spinal stenosis, spondylolisthesis, DDD, lumbago, radiculopathy  POST-OPERATIVE DIAGNOSIS:  Scoliosis, spinal stenosis, spondylolisthesis, DDD, lumbago, radiculopathy   PROCEDURE:  Procedure(s): Thoracic ten to Pelvis fixation with AIRO (N/A) APPLICATION OF INTRAOPERATIVE CT SCAN (N/A)  SURGEON:  Surgeon(s) and Role:    Erline Levine, MD - Primary    * Earnie Larsson, MD - Assisting  PHYSICIAN ASSISTANT:   ASSISTANTS: [place], RN   ANESTHESIA:   general  EBL:  Total I/O In: 3800 [I.V.:3200; Blood:100; IV Piggyback:500] Out: 1150 [Urine:550; Blood:600]  BLOOD ADMINISTERED:100 CC CELLSAVER  DRAINS: (Medium) Hemovact drain(s) in the paravertebral space with  Suction Open   LOCAL MEDICATIONS USED:  MARCAINE    and LIDOCAINE   SPECIMEN:  No Specimen  DISPOSITION OF SPECIMEN:  N/A  COUNTS:  YES  TOURNIQUET:  * No tourniquets in log *  DICTATION: DICTATION: Patient is 56 year old man who has undergone Stage I ALIF and XLIF two days previously who now presents for Stage II decompression and fusion T 10 to I[place].  It was elected to take him to surgery for fusion from T 10 through ilium.  He has severe scoliosis with Preoperative Pelvic Parameters of PI 41.2, LL -29.4, PI-LL +11.8, SVA 148 mm prior to Stage I and PI 41.2, LL 45, PI-LL -3.8, SVA -15 mm Intra-op.  Procedure: Patient was placed in a prone position on the Priddy table attachment for the Aero intra-op CT/ navigation system after smooth and uncomplicated induction of general endotracheal anesthesia. His back was shaved and prepped and draped in usual sterile fashion with betadine scrub and DuraPrep. Area of incision was infiltrated with local lidocaine. Incision was made to the lumbodorsal fascia was incised and exposure was performed of the T 10 to the ilium bilaterally.  Intraoperative CT scanwas obtained which  confirmed correct orientation with marker probes at T 10 through ilium levels.   There was good correction of kypho-scoliotic curvature with patient in prone position. The posterolateral region was extensively decorticated and pedicle screws were placed at T 10  through ilium bilaterally using intraoperative navigation.    45 x 6.5 mm pedicle screws were placed at T 10, T 11, T 12 , bilaterally and 45 x 6.5 mm screw placed on the left at L 1 and 5.5 x 45 on the right at L 1, 5.5 x 45 bilaterally at L 2, 6.5 x 45 bilaterally at L 3, 7.5 x 45 at L 4 and S1 on the left and 6.5 x 45 on the right at L 4 and 7.5 x 45 at L 5 and S 1 on the right.  8.5 x 70 iliac bolts were used bilaterally in the ilium.  Offset connectors were affixed to these screw heads. An intra-operative CT spin was performed with visualization of all screws.  The right L 1 and L 2 screws appeared to have a lateral trajectory, so these were repositioned with a more medial trajectory.   On the left, L 1, L 2, S 1 screws were each redirected more laterally.  Anoth CT scan was performed which showed that all screws were well-positioned.  Bendini rod bending system was utilized and rods were bent, affixed to screw heads and locked down in situ.   Autograft was harvested from both ilia for use in fusion along with 80 cc allograft. Rods were cut, bent and locked down bilaterally in situ  and the posterolateral region was packed with autograft and allograft,  after decorticating the posterolateral region. The wound was irrigated. A medium Hemovac drain was placed. Long-acting Marcaine was injected into the paraspinous musculature.  Fascia was closed with 1 Vicryl sutures skin edges were reapproximated 2 and 3-0 Vicryl sutures. The wound was dressed with Dermabond and an occlusive dressing.  the patient was extubated in the operating room and taken to recovery in stable satisfactory condition he tolerated the operation well counts were correct at the end of the  case.  A  PLAN OF CARE: Admit to inpatient   PATIENT DISPOSITION:  PACU - hemodynamically stable.   Delay start of Pharmacological VTE agent (>24hrs) due to surgical blood loss or risk of bleeding: yes

## 2016-06-26 NOTE — Anesthesia Preprocedure Evaluation (Signed)
Anesthesia Evaluation  Patient identified by MRN, date of birth, ID band Patient awake    Reviewed: Allergy & Precautions, NPO status , Patient's Chart, lab work & pertinent test results  Airway Mallampati: II  TM Distance: >3 FB Neck ROM: Full    Dental  (+) Teeth Intact, Dental Advisory Given   Pulmonary former smoker,    breath sounds clear to auscultation       Cardiovascular hypertension,  Rhythm:Regular Rate:Normal     Neuro/Psych    GI/Hepatic   Endo/Other    Renal/GU      Musculoskeletal   Abdominal (+) + obese,   Peds  Hematology   Anesthesia Other Findings   Reproductive/Obstetrics                             Anesthesia Physical Anesthesia Plan  ASA: III  Anesthesia Plan: General   Post-op Pain Management:    Induction: Intravenous  PONV Risk Score and Plan: Ondansetron and Dexamethasone  Airway Management Planned: Oral ETT  Additional Equipment:   Intra-op Plan:   Post-operative Plan: Possible Post-op intubation/ventilation  Informed Consent: I have reviewed the patients History and Physical, chart, labs and discussed the procedure including the risks, benefits and alternatives for the proposed anesthesia with the patient or authorized representative who has indicated his/her understanding and acceptance.   Dental advisory given  Plan Discussed with: CRNA and Anesthesiologist  Anesthesia Plan Comments:         Anesthesia Quick Evaluation

## 2016-06-26 NOTE — Transfer of Care (Signed)
Immediate Anesthesia Transfer of Care Note  Patient: Travis Huff  Procedure(s) Performed: Procedure(s): Thoracic ten to Pelvis fixation with AIRO (N/A) APPLICATION OF INTRAOPERATIVE CT SCAN (N/A)  Patient Location: PACU  Anesthesia Type:General  Level of Consciousness: awake, alert  and oriented  Airway & Oxygen Therapy: Patient Spontanous Breathing  Post-op Assessment: Report given to RN and Post -op Vital signs reviewed and stable  Post vital signs: Reviewed and stable  Last Vitals:  Vitals:   06/26/16 0551 06/26/16 1520  BP: 139/72 (!) 150/99  Pulse: 85 86  Resp: 18 19  Temp: 37.3 C 36.9 C    Last Pain:  Vitals:   06/26/16 1520  TempSrc:   PainSc: (P) 0-No pain      Patients Stated Pain Goal: 0 (01/41/03 0131)  Complications: No apparent anesthesia complications

## 2016-06-26 NOTE — Anesthesia Procedure Notes (Signed)
Procedure Name: Intubation Date/Time: 06/26/2016 7:44 AM Performed by: Clearnce Sorrel Pre-anesthesia Checklist: Patient identified, Emergency Drugs available, Suction available, Patient being monitored and Timeout performed Patient Re-evaluated:Patient Re-evaluated prior to inductionOxygen Delivery Method: Circle system utilized Preoxygenation: Pre-oxygenation with 100% oxygen Intubation Type: IV induction Ventilation: Mask ventilation without difficulty, Oral airway inserted - appropriate to patient size and Two handed mask ventilation required Laryngoscope Size: Mac and 4 Grade View: Grade I Tube type: Subglottic suction tube Tube size: 7.5 mm Number of attempts: 1 Airway Equipment and Method: Stylet Placement Confirmation: ETT inserted through vocal cords under direct vision,  positive ETCO2 and breath sounds checked- equal and bilateral Secured at: 23 cm Tube secured with: Tape Dental Injury: Teeth and Oropharynx as per pre-operative assessment

## 2016-06-26 NOTE — Op Note (Signed)
06/23/2016 - 06/26/2016  2:56 PM  PATIENT:  Travis Huff  56 y.o. male  PRE-OPERATIVE DIAGNOSIS:  Scoliosis, spinal stenosis, spondylolisthesis, DDD, lumbago, radiculopathy  POST-OPERATIVE DIAGNOSIS:  Scoliosis, spinal stenosis, spondylolisthesis, DDD, lumbago, radiculopathy   PROCEDURE:  Procedure(s): Thoracic ten to Pelvis fixation with AIRO (N/A) APPLICATION OF INTRAOPERATIVE CT SCAN (N/A)  SURGEON:  Surgeon(s) and Role:    Erline Levine, MD - Primary    * Earnie Larsson, MD - Assisting  PHYSICIAN ASSISTANT:   ASSISTANTS: Poteat, RN   ANESTHESIA:   general  EBL:  Total I/O In: 3800 [I.V.:3200; Blood:100; IV Piggyback:500] Out: 1150 [Urine:550; Blood:600]  BLOOD ADMINISTERED:100 CC CELLSAVER  DRAINS: (Medium) Hemovact drain(s) in the paravertebral space with  Suction Open   LOCAL MEDICATIONS USED:  MARCAINE    and LIDOCAINE   SPECIMEN:  No Specimen  DISPOSITION OF SPECIMEN:  N/A  COUNTS:  YES  TOURNIQUET:  * No tourniquets in log *  DICTATION: DICTATION: Patient is 56 year old man who has undergone Stage I ALIF and XLIF two days previously who now presents for Stage II decompression and fusion T 10 to Ilium.  It was elected to take him to surgery for fusion from T 10 through ilium.  He has severe scoliosis with Preoperative Pelvic Parameters of PI 41.2, LL -29.4, PI-LL +11.8, SVA 148 mm prior to Stage I and PI 41.2, LL 45, PI-LL -3.8, SVA -15 mm Intra-op.  Procedure: Patient was placed in a prone position on the [place] table attachment for the Aero intra-op CT/ navigation system after smooth and uncomplicated induction of general endotracheal anesthesia. His back was shaved and prepped and draped in usual sterile fashion with betadine scrub and DuraPrep. Area of incision was infiltrated with local lidocaine. Incision was made to the lumbodorsal fascia was incised and exposure was performed of the T 10 to the ilium bilaterally.  Intraoperative CT scanwas obtained which  confirmed correct orientation with marker probes at T 10 through ilium levels.   There was good correction of kypho-scoliotic curvature with patient in prone position. The posterolateral region was extensively decorticated and pedicle screws were placed at T 10  through ilium bilaterally using intraoperative navigation.    45 x 6.5 mm pedicle screws were placed at T 10, T 11, T 12 , bilaterally and 45 x 6.5 mm screw placed on the left at L 1 and 5.5 x 45 on the right at L 1, 5.5 x 45 bilaterally at L 2, 6.5 x 45 bilaterally at L 3, 7.5 x 45 at L 4 and S1 on the left and 6.5 x 45 on the right at L 4 and 7.5 x 45 at L 5 and S 1 on the right.  8.5 x 70 iliac bolts were used bilaterally in the ilium.  Offset connectors were affixed to these screw heads. An intra-operative CT spin was performed with visualization of all screws.  The right L 1 and L 2 screws appeared to have a lateral trajectory, so these were repositioned with a more medial trajectory.   On the left, L 1, L 2, S 1 screws were each redirected more laterally.  Anoth CT scan was performed which showed that all screws were well-positioned.  Bendini rod bending system was utilized and rods were bent, affixed to screw heads and locked down in situ.   Autograft was harvested from both ilia for use in fusion along with 80 cc allograft. Rods were cut, bent and locked down bilaterally in situ  and the posterolateral region was packed with autograft and allograft,  after decorticating the posterolateral region. The wound was irrigated. A medium Hemovac drain was placed. Long-acting Marcaine was injected into the paraspinous musculature.  Fascia was closed with 1 Vicryl sutures skin edges were reapproximated 2 and 3-0 Vicryl sutures. The wound was dressed with Dermabond and an occlusive dressing.  the patient was extubated in the operating room and taken to recovery in stable satisfactory condition he tolerated the operation well counts were correct at the end of the  case.  A  PLAN OF CARE: Admit to inpatient   PATIENT DISPOSITION:  PACU - hemodynamically stable.   Delay start of Pharmacological VTE agent (>24hrs) due to surgical blood loss or risk of bleeding: yes

## 2016-06-26 NOTE — Interval H&P Note (Signed)
History and Physical Interval Note:  06/26/2016 7:08 AM  Travis Huff  has presented today for surgery, with the diagnosis of Scoliosis  The various methods of treatment have been discussed with the patient and family. After consideration of risks, benefits and other options for treatment, the patient has consented to  Procedure(s) with comments: T10 to Pelvis fixation with AIRO (N/A) - T10 to Pelvis fixation, possible posterior decompression/osteotomies APPLICATION OF INTRAOPERATIVE CT SCAN (N/A) as a surgical intervention .  The patient's history has been reviewed, patient examined, no change in status, stable for surgery.  I have reviewed the patient's chart and labs.  Questions were answered to the patient's satisfaction.     Brice Kossman D

## 2016-06-27 ENCOUNTER — Encounter (HOSPITAL_COMMUNITY): Payer: Self-pay | Admitting: Neurosurgery

## 2016-06-27 MED ORDER — HYDROMORPHONE HCL 1 MG/ML IJ SOLN
0.5000 mg | INTRAMUSCULAR | Status: DC | PRN
  Administered 2016-06-27: 1 mg via INTRAVENOUS
  Administered 2016-06-27 – 2016-06-28 (×2): 0.5 mg via INTRAVENOUS
  Filled 2016-06-27: qty 0.5
  Filled 2016-06-27 (×2): qty 1

## 2016-06-27 MED ORDER — HYDROMORPHONE HCL 2 MG PO TABS
2.0000 mg | ORAL_TABLET | ORAL | Status: DC | PRN
  Administered 2016-06-27 – 2016-06-29 (×9): 4 mg via ORAL
  Filled 2016-06-27 (×10): qty 2

## 2016-06-27 MED ORDER — DIAZEPAM 5 MG PO TABS
5.0000 mg | ORAL_TABLET | Freq: Four times a day (QID) | ORAL | Status: DC | PRN
  Administered 2016-06-27 (×2): 10 mg via ORAL
  Administered 2016-06-28 (×2): 5 mg via ORAL
  Filled 2016-06-27 (×2): qty 2
  Filled 2016-06-27 (×2): qty 1

## 2016-06-27 NOTE — Progress Notes (Addendum)
Patient ID: Travis Huff, male   DOB: February 07, 1960, 56 y.o.   MRN: [id number] Spoke with Dr. [name]. Ok for Dilaudid IV/PO and Valium PO prn pain and spasm. Pt understands the intent is to treat post-op muscle pain and facilitate early mobility without plans to go home on Valium. Orders entered.  (Pt notes Diluadid use in the past without problems. Morphine caused itching.)  Discussed with patient and his wife.

## 2016-06-27 NOTE — Progress Notes (Signed)
Physical Therapy Treatment Patient Details Name: Travis Huff MRN: [id number] DOB: 08/26/1960 Today's Date: 06/27/2016    History of Present Illness Pt is a 56 y.o. male s/p L5-S1 ALIF and R L2-3, L3-4, L4-5 ALIF with abdominal exposure. He is scheduled for stage 2 of surgery 06/26/16. No pertinent past medical history per chart.    PT Comments    Patient tolerated ambulating 18ft this session with min A.Pt required mod A for bed mobility. Pt presented with improved R LE strength and mobility this pm but with decreased sensation. Given pt's current mobility level he may require CIR/ST-SNF for further skilled PT services prior to discharging home with wife's assist. PT will continue to follow for mobility progression and d/c planning.    Follow Up Recommendations  Home health PT;Supervision for mobility/OOB      Equipment Recommendations  3in1 (PT)    Recommendations for Other Services       Precautions / Restrictions Precautions Precautions: Back Precaution Comments: Pt and wife able to recall 3/3 precautions Required Braces or Orthoses: Spinal Brace Spinal Brace: Thoracolumbosacral orthotic;Applied in sitting position Restrictions Weight Bearing Restrictions: No    Mobility  Bed Mobility Overal bed mobility: Needs Assistance Bed Mobility: Rolling;Sidelying to Sit Rolling: Min assist Sidelying to sit: Mod assist       General bed mobility comments: assist to bring bilat LE over when rolling into sidelying and to bring bilat LE to EOB and elevate trunk into sitting; cues for sequencing and technique; pt c/o feeling like he would "pass out" upon sitting; vitals WNL and symptoms subsided prior to attempting to stand  Transfers Overall transfer level: Needs assistance Equipment used: Rolling walker (2 wheeled) Transfers: Sit to/from Stand Sit to Stand: Min assist;From elevated surface         General transfer comment: cues for safe hand placement; assist to steady  upon standing; pre gait activities in standing due to pt c/o R LE weakness prior to ambulation  Ambulation/Gait Ambulation/Gait assistance: Min assist Ambulation Distance (Feet): 45 Feet Assistive device: Rolling walker (2 wheeled) Gait Pattern/deviations: Step-through pattern;Decreased stride length Gait velocity: decreased   General Gait Details: cues for posture and proximity of RW; assist to steady and maintain RW  within safe proximity   Stairs            Wheelchair Mobility    Modified Rankin (Stroke Patients Only)       Balance Overall balance assessment: Needs assistance Sitting-balance support: Feet supported;Bilateral upper extremity supported Sitting balance-Leahy Scale: Fair     Standing balance support: Bilateral upper extremity supported Standing balance-Leahy Scale: Poor                              Cognition Arousal/Alertness: Awake/alert Behavior During Therapy: WFL for tasks assessed/performed Overall Cognitive Status: Within Functional Limits for tasks assessed                                        Exercises      General Comments General comments (skin integrity, edema, etc.): pt appeared to have strength and mobility back in R LE this pm however reported continued decreased sensation      Pertinent Vitals/Pain Pain Assessment: 0-10 Pain Score: 7  Pain Location: back Pain Descriptors / Indicators: Aching;Operative site guarding;Sore Pain Intervention(s): Limited activity within patient's tolerance;Monitored during  session;Repositioned    Home Living                      Prior Function            PT Goals (current goals can now be found in the care plan section) Acute Rehab PT Goals PT Goal Formulation: With patient/family Time For Goal Achievement: 07/08/16 Potential to Achieve Goals: Good Progress towards PT goals: Progressing toward goals    Frequency    Min 5X/week      PT Plan  Current plan remains appropriate    Co-evaluation              AM-PAC PT "6 Clicks" Daily Activity  Outcome Measure  Difficulty turning over in bed (including adjusting bedclothes, sheets and blankets)?: Total Difficulty moving from lying on back to sitting on the side of the bed? : Total Difficulty sitting down on and standing up from a chair with arms (e.g., wheelchair, bedside commode, etc,.)?: Total Help needed moving to and from a bed to chair (including a wheelchair)?: A Little Help needed walking in hospital room?: A Little Help needed climbing 3-5 steps with a railing? : A Lot 6 Click Score: 11    End of Session Equipment Utilized During Treatment: Back brace;Gait belt Activity Tolerance: Patient tolerated treatment well Patient left: with call bell/phone within reach;with family/visitor present;in chair Nurse Communication: Mobility status PT Visit Diagnosis: Muscle weakness (generalized) (M62.81);Other abnormalities of gait and mobility (R26.89);Pain Pain - Right/Left:  (anterior) Pain - part of body:  (andomen and back)     Time: 8676-7209 PT Time Calculation (min) (ACUTE ONLY): 51 min  Charges:  $Gait Training: 23-37 mins $Therapeutic Activity: 8-22 mins                    G Codes:       Earney Navy, PTA Pager: [phone]     Darliss Cheney 06/27/2016, 4:31 PM

## 2016-06-27 NOTE — Care Management Note (Signed)
Case Management Note  Patient Details  Name: Travis Huff MRN: [id number] Date of Birth: 03-Dec-1960  Subjective/Objective:                    Action/Plan: PT recommending [facility] services. No f/u per OT. CM following for d/c needs, physician orders.  Expected Discharge Date:                  Expected Discharge Plan:  [place]  In-House Referral:  NA  Discharge planning Services  CM Consult  Post Acute Care Choice:  Home Health Choice offered to:  Spouse, Patient  DME Arranged:  N/A DME Agency:  NA  HH Arranged:    [place] Agency:  [place]  Status of Service:  In process, will continue to follow  If discussed at Long Length of Stay Meetings, dates discussed:    Additional Comments:  Pollie Friar, RN 06/27/2016, 11:32 AM

## 2016-06-27 NOTE — Progress Notes (Addendum)
Patient ID: Travis Huff, male   DOB: 21-Mar-1960, 56 y.o.   MRN: [id number] Returned to discuss with pt the possibility of peroneal nerve injury causing right knee pain and right foot/dorsiflexion weakness. (Discussion with OR staff revealed upon removing surgical drapes yesterday, pt's right lower leg was found off and lateral to padding/pillows. No visible injury/skin irritation/swelling/discoloration was observed. Skin temp normal.) However, upon speaking with pt, he proceeds to forcefully plantar-flex and dorsiflex both feet, stating "It came back about 59minutes after Dr. [name] left this morning." Resistance exam reveals full strength both lower extremities. Pt currently reports some persistent right knee soreness, managed with cold pack.  Pain has responded well to Dilaudid. Pt and wife verbalize understanding of situation, and that "irritated nerve" symptoms may be intermittent. Plan remains to mobilize in brace with PT.   Patient's post-operative hemoglobin reflects acute surgical blood loss as well as drainage through Hemovac drain.  The current hemoglobin is acceptable and will not require transfusion.

## 2016-06-27 NOTE — Progress Notes (Addendum)
Subjective: Patient reports "I probably slept an hour...just severe burning (back)pain whenever I move"  Objective: Vital signs in last 24 hours: Temp:  [97.5 F (36.4 C)-99.5 F (37.5 C)] 99.5 F (37.5 C) (06/26 0440) Pulse Rate:  [64-112] 112 (06/26 0440) Resp:  [16-20] 20 (06/26 0440) BP: (99-150)/(60-110) 104/60 (06/26 0440) SpO2:  [96 %-100 %] 98 % (06/26 0440)  Intake/Output from previous day: 06/25 0701 - 06/26 0700 In: 4990 [P.O.:290; I.V.:3800; Blood:100; IV Piggyback:500] Out: 2830 [Urine:1600; Drains:630; Blood:600] Intake/Output this shift: No intake/output data recorded.  Alert, conversant. Remains anxious XH:[id number] of back pain with position changes; but much less anxious than last night. Vitals stable. Incisions abdomen, side, and thoraco-lumbar without erythema, swelling, or drainage. Hemovac patent. Good strength BLE. Continues to report some numbness feet and calves, and hands this am.   Lab Results:  Recent Labs  06/26/16 1800  WBC 10.5  HGB 10.6*  HCT 32.9*  PLT 155   BMET No results for input(s): NA, K, CL, CO2, GLUCOSE, BUN, CREATININE, CALCIUM in the last 72 hours.  Studies/Results: No results found.  Assessment/Plan: Improving  LOS: 4 days  Mobilize in brace with PT. Reassured re: incisional pain and muscle spasms; and need to mobilize.    Verdis Prime 06/27/2016, 7:16 AM   Patient complains of greater numbness in right foot than left.  He has significant right foot DF weakness with preserved strength all other motor groups.  Currently sitting up in chair.  Less pain in his back than yesterday.

## 2016-06-27 NOTE — Progress Notes (Signed)
Occupational Therapy Treatment Patient Details Name: Travis Huff MRN: [id number] DOB: [birth year] Today's Date: 06/27/2016    History of present illness Pt is a 56 y.o. male s/p L5-S1 ALIF and R L2-3, L3-4, L4-5 ALIF with abdominal exposure 6/22. Pt now s/p stage 2 of surgery with T10-pelvis fixation with AIRO 6/25. No pertinent past medical history per chart.   OT comments  Pt limited by pain now s/p second stage of surgery this session but did demonstrate ability to complete toilet transfers with min assist +2. He continues to require max assist for LB ADL and mod assist for UB dressing to doff brace. Continued education concerning back precautions and compensatory ADL strategies this session. Pt reports anxiety concerning recovery process and provided verbal reassurance. Hopeful that pt will improve to be able to return home with home health OT services and assistance from wife. However, if pt unable to progress with ADL independence, may need to consider post-acute rehabilitation prior to return home. Will continue to follow.    Follow Up Recommendations  No OT follow up;Home health OT    Equipment Recommendations  3 in 1 bedside commode    Recommendations for Other Services      Precautions / Restrictions Precautions Precautions: Back Precaution Booklet Issued: Yes (comment) Precaution Comments: Pt and wife able to recall 3/3 precautions Required Braces or Orthoses: Spinal Brace Spinal Brace: Thoracolumbosacral orthotic;Applied in sitting position Restrictions Weight Bearing Restrictions: No       Mobility Bed Mobility Overal bed mobility: Needs Assistance Bed Mobility: Rolling;Sit to Sidelying Rolling: Min assist Sidelying to sit: Mod assist     Sit to sidelying: Mod assist General bed mobility comments: Mod assist for B LE management returning to bed. VC's for log roll technique.   Transfers Overall transfer level: Needs assistance Equipment used: Rolling walker  (2 wheeled) Transfers: Sit to/from Stand Sit to Stand: Min assist;+2 safety/equipment         General transfer comment: Steadying assistance required on rise to standing.     Balance Overall balance assessment: Needs assistance Sitting-balance support: Feet supported;Bilateral upper extremity supported Sitting balance-Leahy Scale: Fair     Standing balance support: Bilateral upper extremity supported Standing balance-Leahy Scale: Poor                             ADL either performed or assessed with clinical judgement   ADL Overall ADL's : Needs assistance/impaired                 Upper Body Dressing : Sitting;Moderate assistance   Lower Body Dressing: Sit to/from stand;Maximal assistance   Toilet Transfer: +2 for safety/equipment;Stand-pivot;RW;Minimal assistance           Functional mobility during ADLs: Minimal assistance;+2 for safety/equipment;Rolling walker (stand-pivot only this session) General ADL Comments: Pt limited by increased pain this session s/p second stage of surgery.      Vision   Vision Assessment?: No apparent visual deficits   Perception     Praxis      Cognition Arousal/Alertness: Awake/alert Behavior During Therapy: WFL for tasks assessed/performed Overall Cognitive Status: Within Functional Limits for tasks assessed                                          Exercises     Shoulder Instructions  General Comments Pt continues to report significant pain and fluctuating strength in R LE. Able to stand and complete ROM against gravity this session.     Pertinent Vitals/ Pain       Pain Assessment: Faces Pain Score: 7  Faces Pain Scale: Hurts whole lot Pain Location: back Pain Descriptors / Indicators: Aching;Operative site guarding;Sore Pain Intervention(s): Limited activity within patient's tolerance;Monitored during session;Repositioned  Home Living                                           Prior Functioning/Environment              Frequency  Min 3X/week        Progress Toward Goals  OT Goals(current goals can now be found in the care plan section)  Progress towards OT goals: Not progressing toward goals - comment (increased pain s/p second stage of surgery)  Acute Rehab OT Goals Patient Stated Goal: not to hurt his back while recovering OT Goal Formulation: With patient/family Time For Goal Achievement: 07/08/16 Potential to Achieve Goals: Good ADL Goals Pt Will Perform Grooming: with supervision;standing Pt Will Perform Upper Body Dressing: sitting;with min assist;with caregiver independent in assisting Pt Will Perform Lower Body Dressing: with supervision;with caregiver independent in assisting;with adaptive equipment;sit to/from stand Pt Will Transfer to Toilet: with supervision;ambulating;bedside commode (BSC over toilet) Pt Will Perform Toileting - Clothing Manipulation and hygiene:  (with or without AE) Pt Will Perform Tub/Shower Transfer: Tub transfer;shower seat;ambulating;rolling walker;with min guard assist;with caregiver independent in assisting Additional ADL Goal #1: Pt and wife will verbalize 3/3 back precautions and adhere to these during morning ADL routine.   Plan Discharge plan needs to be updated    Co-evaluation                 AM-PAC PT "6 Clicks" Daily Activity     Outcome Measure   Help from another person eating meals?: None Help from another person taking care of personal grooming?: A Little Help from another person toileting, which includes using toliet, bedpan, or urinal?: A Lot Help from another person bathing (including washing, rinsing, drying)?: A Lot Help from another person to put on and taking off regular upper body clothing?: A Lot Help from another person to put on and taking off regular lower body clothing?: A Lot 6 Click Score: 15    End of Session Equipment Utilized During Treatment:  Rolling walker;Back brace  OT Visit Diagnosis: Other abnormalities of gait and mobility (R26.89);Pain Pain - Right/Left: Right Pain - part of body: Leg (back)   Activity Tolerance Patient tolerated treatment well   Patient Left in bed;with call bell/phone within reach;with family/visitor present   Nurse Communication Mobility status        Time: 2992-4268 OT Time Calculation (min): 35 min  Charges: OT General Charges $OT Visit: 1 Procedure OT Treatments $Self Care/Home Management : 23-37 mins  Norman Herrlich, MS OTR/L  Pager: [place] [facility] Sherpa 06/27/2016, 5:36 PM

## 2016-06-28 NOTE — Consult Note (Signed)
   [facility] CM Inpatient Consult   06/28/2016  Travis Huff Dec 19, 1960 [id number]     Came to visit Travis Huff on behalf of [place] to Wellness program for [employer] employees/dependents with [name].  Travis Huff' wife is a Marine scientist with [employer].   Travis Huff denies any Link to Wellness needs at this time.   Left Link to Wellness brochure, contact information, and 24hr nurse line magnet at bedside.  Discussed post hospital discharge call.  Appreciative of visit.   Marthenia Rolling, MSN-Ed, RN,BSN [facility] Liaison [phone]

## 2016-06-28 NOTE — Progress Notes (Signed)
Patient ambulated in hall independent after set up, tolerated well, prior to ambulating he sat in chair for 45 min.

## 2016-06-28 NOTE — Anesthesia Postprocedure Evaluation (Signed)
Anesthesia Post Note  Patient: Travis Huff  Procedure(s) Performed: Procedure(s) (LRB): Thoracic ten to Pelvis fixation with AIRO (N/A) APPLICATION OF INTRAOPERATIVE CT SCAN (N/A)     Patient location during evaluation: PACU Anesthesia Type: General Level of consciousness: awake, awake and alert and oriented Pain management: pain level controlled Vital Signs Assessment: post-procedure vital signs reviewed and stable Respiratory status: spontaneous breathing, nonlabored ventilation and respiratory function stable Cardiovascular status: blood pressure returned to baseline Postop Assessment: no headache Anesthetic complications: no    Last Vitals:  Vitals:   06/28/16 0050 06/28/16 0421  BP: (!) 113/47 (!) 100/54  Pulse: 94 82  Resp: 20 20  Temp: 37.1 C 36.8 C    Last Pain:  Vitals:   06/28/16 0421  TempSrc: Oral  PainSc:                  Shiara Mcgough COKER

## 2016-06-28 NOTE — Progress Notes (Signed)
Patient ambulated in hall today with PT and sit in chair with minimal assistance. Pt assisted back to bed with walker and stand by assist. Pt still c/o right leg weakness at this time. No other distress noted. Family at bedside. Will continue to monitor.   Ave Filter, RN

## 2016-06-28 NOTE — Progress Notes (Addendum)
Subjective: Patient reports "I'm doing better!"  Objective: Vital signs in last 24 hours: Temp:  [98 F (36.7 C)-98.8 F (37.1 C)] 98.2 F (36.8 C) (06/27 0421) Pulse Rate:  [66-94] 82 (06/27 0421) Resp:  [20] 20 (06/27 0421) BP: (100-179)/(47-163) 100/54 (06/27 0421) SpO2:  [95 %-99 %] 97 % (06/27 0421)  Intake/Output from previous day: 06/26 0701 - 06/27 0700 In: 1003 [P.O.:900; I.V.:3] Out: 2150 [Urine:2050; Drains:100] Intake/Output this shift: No intake/output data recorded.  Alert, conversant, smiling. Seated in chair wearing TLSO. Wife present. Good strength BLE. Dorsiflexion/plantarflexion full bilaterally. Stands and walks with SBA using TLSO and walker. Incisions without erythema, swelling, or drainage beneath honeycombs and Dermabond. Hemovac patent. Pain well controlled with po Dilaudid.  Lab Results:  Recent Labs  06/26/16 1800  WBC 10.5  HGB 10.6*  HCT 32.9*  PLT 155   BMET No results for input(s): NA, K, CL, CO2, GLUCOSE, BUN, CREATININE, CALCIUM in the last 72 hours.  Studies/Results: No results found.  Assessment/Plan: Improving  LOS: 5 days  Continue to mobilize in brace with PT. Will leave Hemovac in place today.   Verdis Prime 06/28/2016, 8:30 AM    Patient progressing well.  Transient right foot weakness is resolved.  CBC reflects acute blood loss anemia and hemodilution.  Patient is doing well.

## 2016-06-28 NOTE — Progress Notes (Signed)
Physical Therapy Treatment Patient Details Name: Travis Huff MRN: [id number] DOB: Oct 28, 1960 Today's Date: 06/28/2016    History of Present Illness Pt is a 56 y.o. male s/p L5-S1 ALIF and R L2-3, L3-4, L4-5 ALIF with abdominal exposure 6/22. Pt now s/p stage 2 of surgery with T10-pelvis fixation with AIRO 6/25. No pertinent past medical history per chart.    PT Comments    Pt seen for mobility progression and tolerated ambulating an increased distance. Pt continues to demonstrate significant bilateral LE weakness and presenting with instability of bilateral knees with ambulation. Will plan for stair training next session if appropriate as pt has stairs to enter his home. Pt would continue to benefit from skilled physical therapy services at this time while admitted and after d/c to address the below listed limitations in order to improve overall safety and independence with functional mobility.    Follow Up Recommendations  Home health PT;Supervision for mobility/OOB     Equipment Recommendations  3in1 (PT)    Recommendations for Other Services       Precautions / Restrictions Precautions Precautions: Back Precaution Comments: Pt and wife able to recall 3/3 precautions Required Braces or Orthoses: Spinal Brace Spinal Brace: Thoracolumbosacral orthotic;Applied in sitting position Restrictions Weight Bearing Restrictions: No    Mobility  Bed Mobility               General bed mobility comments: pt sitting EOB upon arrival  Transfers Overall transfer level: Needs assistance Equipment used: Rolling walker (2 wheeled) Transfers: Sit to/from Stand Sit to Stand: Min guard         General transfer comment: close min guard for safety/stability, increased time, good technique  Ambulation/Gait Ambulation/Gait assistance: Min assist;Mod assist Ambulation Distance (Feet): 200 Feet Assistive device: Rolling walker (2 wheeled) Gait Pattern/deviations: Step-through  pattern;Decreased stride length Gait velocity: decreased Gait velocity interpretation: Below normal speed for age/gender General Gait Details: modest instability at bilateral knees but no true buckling, min-mod A for safety, good posture throughout   Stairs            Wheelchair Mobility    Modified Rankin (Stroke Patients Only)       Balance Overall balance assessment: Needs assistance Sitting-balance support: Feet supported;Bilateral upper extremity supported Sitting balance-Leahy Scale: Fair     Standing balance support: Bilateral upper extremity supported Standing balance-Leahy Scale: Poor Standing balance comment: pt reliant on bilateral UEs on RW                            Cognition Arousal/Alertness: Awake/alert Behavior During Therapy: WFL for tasks assessed/performed Overall Cognitive Status: Within Functional Limits for tasks assessed                                        Exercises      General Comments        Pertinent Vitals/Pain Pain Assessment: Faces Faces Pain Scale: Hurts little more Pain Location: back Pain Descriptors / Indicators: Aching;Operative site guarding;Sore Pain Intervention(s): Monitored during session;Repositioned    Home Living                      Prior Function            PT Goals (current goals can now be found in the care plan section) Acute Rehab PT Goals PT  Goal Formulation: With patient/family Time For Goal Achievement: 07/08/16 Potential to Achieve Goals: Good Progress towards PT goals: Progressing toward goals    Frequency    Min 5X/week      PT Plan Current plan remains appropriate    Co-evaluation              AM-PAC PT "6 Clicks" Daily Activity  Outcome Measure  Difficulty turning over in bed (including adjusting bedclothes, sheets and blankets)?: Total Difficulty moving from lying on back to sitting on the side of the bed? : Total Difficulty sitting  down on and standing up from a chair with arms (e.g., wheelchair, bedside commode, etc,.)?: Total Help needed moving to and from a bed to chair (including a wheelchair)?: A Little Help needed walking in hospital room?: A Little Help needed climbing 3-5 steps with a railing? : A Lot 6 Click Score: 11    End of Session Equipment Utilized During Treatment: Back brace;Gait belt Activity Tolerance: Patient tolerated treatment well Patient left: in chair;with call bell/phone within reach;with family/visitor present Nurse Communication: Mobility status PT Visit Diagnosis: Muscle weakness (generalized) (M62.81);Other abnormalities of gait and mobility (R26.89);Pain Pain - part of body:  (back)     Time: 8159-4707 PT Time Calculation (min) (ACUTE ONLY): 26 min  Charges:  $Gait Training: 23-37 mins                    G Codes:       [place], [place], [place] 06/28/2016, 5:09 PM

## 2016-06-29 MED FILL — HYDROmorphone HCL 2 MG TABS: 2 | 17 days supply | Qty: 100 | Fill #0

## 2016-06-29 MED FILL — CYCLOBENZAPRINE 10 MG TABLE: 10 | 30 days supply | Qty: 90 | Fill #0

## 2016-06-29 NOTE — Progress Notes (Signed)
Physical Therapy Treatment Patient Details Name: Travis Huff MRN: [id number] DOB: [birth year] Today's Date: 06/29/2016    History of Present Illness Pt is a 56 y.o. male s/p L5-S1 ALIF and R L2-3, L3-4, L4-5 ALIF with abdominal exposure 6/22. Pt now s/p stage 2 of surgery with T10-pelvis fixation with AIRO 6/25. Pt has had bilateral TKA and Hip replacement.    PT Comments    Pt too painful to go for a walk in the halls or complete the stairs, but did want to return to bed.  Emphasis on transfer safety, gait stability and reinforcing bed mobility technique.    Follow Up Recommendations  Home health PT;Supervision for mobility/OOB     Equipment Recommendations  3in1 (PT)    Recommendations for Other Services       Precautions / Restrictions Precautions Precautions: Back Required Braces or Orthoses: Spinal Brace Spinal Brace: Thoracolumbosacral orthotic;Applied in sitting position    Mobility  Bed Mobility Overal bed mobility: Needs Assistance Bed Mobility: Sit to Sidelying;Rolling Rolling: Min guard       Sit to sidelying: Min assist General bed mobility comments: pt needed minimal assist to get his legs into bed, but we discussed better technique to help pt get the legs into bed without assist.  Transfers Overall transfer level: Needs assistance Equipment used: Rolling walker (2 wheeled) Transfers: Sit to/from Stand Sit to Stand: Min guard         General transfer comment: guard for safety,  cues for better hand placement  Ambulation/Gait Ambulation/Gait assistance: Min assist Ambulation Distance (Feet): 15 Feet Assistive device: Rolling walker (2 wheeled) Gait Pattern/deviations: Step-through pattern Gait velocity: decreased Gait velocity interpretation: Below normal speed for age/gender General Gait Details: unstable and guarded throughout, but no overt buckling.  To return later when has pain meds to ambulate further.   Stairs             Wheelchair Mobility    Modified Rankin (Stroke Patients Only)       Balance Overall balance assessment: Needs assistance   Sitting balance-Leahy Scale: Fair     Standing balance support: Bilateral upper extremity supported Standing balance-Leahy Scale: Poor Standing balance comment: pt reliant on bilateral UEs on RW due to knee instability                            Cognition Arousal/Alertness: Awake/alert Behavior During Therapy: WFL for tasks assessed/performed Overall Cognitive Status: Within Functional Limits for tasks assessed                                        Exercises      General Comments General comments (skin integrity, edema, etc.): Reinforced general transfer safety, better technique for transition to/from sidelying, and about general precautions.      Pertinent Vitals/Pain Pain Assessment: Faces Faces Pain Scale: Hurts even more Pain Location: back Pain Descriptors / Indicators: Aching;Operative site guarding;Sore Pain Intervention(s): Limited activity within patient's tolerance;Monitored during session    Home Living                      Prior Function            PT Goals (current goals can now be found in the care plan section) Acute Rehab PT Goals Patient Stated Goal: not to hurt his back while recovering  PT Goal Formulation: With patient/family Time For Goal Achievement: 07/08/16 Potential to Achieve Goals: Good Progress towards PT goals: Progressing toward goals    Frequency    Min 5X/week      PT Plan Current plan remains appropriate    Co-evaluation              AM-PAC PT "6 Clicks" Daily Activity  Outcome Measure  Difficulty turning over in bed (including adjusting bedclothes, sheets and blankets)?: A Little Difficulty moving from lying on back to sitting on the side of the bed? : Total Difficulty sitting down on and standing up from a chair with arms (e.g., wheelchair,  bedside commode, etc,.)?: Total Help needed moving to and from a bed to chair (including a wheelchair)?: A Little Help needed walking in hospital room?: A Little Help needed climbing 3-5 steps with a railing? : A Lot 6 Click Score: 13    End of Session Equipment Utilized During Treatment: Back brace Activity Tolerance: Patient tolerated treatment well Patient left: in bed;with call bell/phone within reach;with bed alarm set Nurse Communication: Mobility status PT Visit Diagnosis: Muscle weakness (generalized) (M62.81);Unsteadiness on feet (R26.81);Pain Pain - part of body:  (back)     Time: 7001-7494 PT Time Calculation (min) (ACUTE ONLY): 12 min  Charges:  $Gait Training: 8-22 mins                    G Codes:       14-Jul-2016  Donnella Sham, PT [phone]  (pager)   Tessie Fass Kallen Mccrystal 14-Jul-2016, 3:13 PM

## 2016-06-29 NOTE — Progress Notes (Signed)
Physical Therapy Treatment Patient Details Name: Travis Huff MRN: [id number] DOB: 02-21-1960 Today's Date: 06/29/2016    History of Present Illness Pt is a 56 y.o. male s/p L5-S1 ALIF and R L2-3, L3-4, L4-5 ALIF with abdominal exposure 6/22. Pt now s/p stage 2 of surgery with T10-pelvis fixation with AIRO 6/25. Pt has had bilateral TKA and Hip replacement.    PT Comments    Pt progressing steadily, limited by knee instability.  Emphasis on gait stamina, stair training and completing all back education.   Follow Up Recommendations  Home health PT;Supervision for mobility/OOB     Equipment Recommendations  3in1 (PT)    Recommendations for Other Services       Precautions / Restrictions Precautions Precautions: Back Precaution Booklet Issued: Yes (comment) Required Braces or Orthoses: Spinal Brace Spinal Brace: Thoracolumbosacral orthotic;Applied in sitting position    Mobility  Bed Mobility Overal bed mobility: Needs Assistance Bed Mobility: Sit to Sidelying;Rolling Rolling: Min guard       Sit to sidelying: Min assist General bed mobility comments: pt needed minimal assist to get his legs into bed, but we discussed better technique to help pt get the legs into bed without assist.  Transfers Overall transfer level: Needs assistance Equipment used: Rolling walker (2 wheeled) Transfers: Sit to/from Stand Sit to Stand: Min guard         General transfer comment: guard for safety,  cues for better hand placement  Ambulation/Gait Ambulation/Gait assistance: Min assist Ambulation Distance (Feet): 220 Feet Assistive device: Rolling walker (2 wheeled) Gait Pattern/deviations: Step-through pattern Gait velocity: decreased Gait velocity interpretation: Below normal speed for age/gender General Gait Details: unsteady in knees with 1 episode of R knee buckling as fatigue set in.  Cues for posture and when it was time to sit.   Stairs Stairs: Yes   Stair  Management: One rail Right;One rail Left;Step to pattern;Forwards Number of Stairs: 5 General stair comments: cues for best technique given knee instability and min assist   Wheelchair Mobility    Modified Rankin (Stroke Patients Only)       Balance Overall balance assessment: Needs assistance   Sitting balance-Leahy Scale: Fair     Standing balance support: Bilateral upper extremity supported Standing balance-Leahy Scale: Poor Standing balance comment: pt reliant on bilateral UEs on RW due to knee instability                            Cognition Arousal/Alertness: Awake/alert Behavior During Therapy: WFL for tasks assessed/performed Overall Cognitive Status: Within Functional Limits for tasks assessed                                        Exercises      General Comments General comments (skin integrity, edema, etc.): pt and wife instructed in back care/prec including progression of activity.      Pertinent Vitals/Pain Pain Assessment: Faces Faces Pain Scale: Hurts even more Pain Location: back Pain Descriptors / Indicators: Aching;Operative site guarding;Sore Pain Intervention(s): Limited activity within patient's tolerance;Monitored during session    Home Living                      Prior Function            PT Goals (current goals can now be found in the care plan section) Acute  Rehab PT Goals Patient Stated Goal: not to hurt his back while recovering PT Goal Formulation: With patient/family Time For Goal Achievement: 07/08/16 Potential to Achieve Goals: Good Progress towards PT goals: Progressing toward goals    Frequency    Min 5X/week      PT Plan Current plan remains appropriate    Co-evaluation              AM-PAC PT "6 Clicks" Daily Activity  Outcome Measure  Difficulty turning over in bed (including adjusting bedclothes, sheets and blankets)?: A Little Difficulty moving from lying on back to  sitting on the side of the bed? : Total Difficulty sitting down on and standing up from a chair with arms (e.g., wheelchair, bedside commode, etc,.)?: A Little Help needed moving to and from a bed to chair (including a wheelchair)?: A Little Help needed walking in hospital room?: A Little Help needed climbing 3-5 steps with a railing? : A Little 6 Click Score: 16    End of Session Equipment Utilized During Treatment: Back brace Activity Tolerance: Patient tolerated treatment well Patient left: in chair;with call bell/phone within reach;with family/visitor present Nurse Communication: Mobility status PT Visit Diagnosis: Unsteadiness on feet (R26.81);Other abnormalities of gait and mobility (R26.89);Muscle weakness (generalized) (M62.81) Pain - part of body:  (back)     Time: 3299-2426 PT Time Calculation (min) (ACUTE ONLY): 39 min  Charges:  $Gait Training: 8-22 mins $Therapeutic Activity: 8-22 mins $Self Care/Home Management: 8-22                    G Codes:       2016-07-09  Donnella Sham, PT [phone]  (pager)   Travis Huff 07-09-2016, 5:35 PM

## 2016-06-29 NOTE — Progress Notes (Signed)
Occupational Therapy Treatment Patient Details Name: Travis Huff MRN: [id number] DOB: [birth year] Today's Date: 06/29/2016    History of present illness Pt is a 56 y.o. male s/p L5-S1 ALIF and R L2-3, L3-4, L4-5 ALIF with abdominal exposure 6/22. Pt now s/p stage 2 of surgery with T10-pelvis fixation with AIRO 6/25. Pt has had bilateral TKA and Hip replacement.   OT comments  Pt making good progress towards OT goals this session, in additional to improving functional transfers session focused on AE education and practice. Pt at appropriate level for dc from OT perspective. Pt will have 24 hour assist from wife who is Therapist, sports. Pt familiar with 3/3 back precautions and is able to don brace correctly with assist (Pt able to give instructions).  Follow Up Recommendations  No OT follow up;Supervision/Assistance - 24 hour (initially)    Equipment Recommendations  3 in 1 bedside commode    Recommendations for Other Services      Precautions / Restrictions Precautions Precautions: Back Precaution Booklet Issued: Yes (comment) Precaution Comments: reviewed handout and Pt able to recall 3/3 precautions Required Braces or Orthoses: Spinal Brace Spinal Brace: Thoracolumbosacral orthotic;Applied in sitting position Spinal Brace Comments: Pt and OT donned brace with education for how to re-tighten Restrictions Weight Bearing Restrictions: No       Mobility Bed Mobility Overal bed mobility: Needs Assistance Bed Mobility: Rolling;Sit to Sidelying Rolling: Min guard       Sit to sidelying: Mod assist General bed mobility comments: mod A for lifting BLE into bed - good technique from Pt  Transfers Overall transfer level: Needs assistance Equipment used: Rolling walker (2 wheeled) Transfers: Sit to/from Stand Sit to Stand: Min guard         General transfer comment: close min guard for safety/stability, increased time, good technique    Balance Overall balance assessment: Needs  assistance Sitting-balance support: Feet supported;Bilateral upper extremity supported Sitting balance-Leahy Scale: Fair Sitting balance - Comments: uses RW for balance and repositioning   Standing balance support: Bilateral upper extremity supported Standing balance-Leahy Scale: Poor Standing balance comment: pt reliant on bilateral UEs on RW                           ADL either performed or assessed with clinical judgement   ADL Overall ADL's : Needs assistance/impaired                 Upper Body Dressing : Moderate assistance;Sitting;Standing Upper Body Dressing Details (indicate cue type and reason): re-adjusted brace in standing for tighter fit Lower Body Dressing: Maximal assistance;Sit to/from stand;With adaptive equipment Lower Body Dressing Details (indicate cue type and reason): re-educated in AE for LB dressing (long handle shoe horn, sock donner, grabber/reacher) Toilet Transfer: Minimal assistance;Stand-pivot;BSC;RW Toilet Transfer Details (indicate cue type and reason): simulated with transfer from recliner to bed Toileting- Clothing Manipulation and Hygiene: Minimal assistance;Sit to/from stand;With adaptive equipment Toileting - Clothing Manipulation Details (indicate cue type and reason): educated in toilet aide with special focus on how to wrap toilet paper   Tub/Shower Transfer Details (indicate cue type and reason): Discussed with Pt about use of 3 in 1 as shower chair. Pt states he feels confident as he had to complete them after his joint replacements Functional mobility during ADLs: Minimal assistance;Rolling walker (for short distance) General ADL Comments: improved pain management this session     Vision       Perception  Praxis      Cognition Arousal/Alertness: Awake/alert Behavior During Therapy: WFL for tasks assessed/performed Overall Cognitive Status: Within Functional Limits for tasks assessed                                           Exercises     Shoulder Instructions       General Comments      Pertinent Vitals/ Pain       Pain Assessment: 0-10 Pain Score: 8  Pain Location: back Pain Descriptors / Indicators: Aching;Operative site guarding;Sore Pain Intervention(s): Monitored during session;Repositioned;Premedicated before session  Home Living                                          Prior Functioning/Environment              Frequency  Min 3X/week        Progress Toward Goals  OT Goals(current goals can now be found in the care plan section)  Progress towards OT goals: Progressing toward goals  Acute Rehab OT Goals Patient Stated Goal: not to hurt his back while recovering OT Goal Formulation: With patient Time For Goal Achievement: 07/08/16 Potential to Achieve Goals: Good  Plan Discharge plan remains appropriate    Co-evaluation                 AM-PAC PT "6 Clicks" Daily Activity     Outcome Measure   Help from another person eating meals?: None Help from another person taking care of personal grooming?: A Little Help from another person toileting, which includes using toliet, bedpan, or urinal?: A Lot Help from another person bathing (including washing, rinsing, drying)?: A Lot Help from another person to put on and taking off regular upper body clothing?: A Lot Help from another person to put on and taking off regular lower body clothing?: A Lot 6 Click Score: 15    End of Session Equipment Utilized During Treatment: Rolling walker;Back brace (AE kit)  OT Visit Diagnosis: Other abnormalities of gait and mobility (R26.89);Pain Pain - Right/Left: Right Pain - part of body: Leg (back)   Activity Tolerance Patient tolerated treatment well   Patient Left in bed;with call bell/phone within reach;with bed alarm set   Nurse Communication Mobility status        Time: 1610-9604 OT Time Calculation (min): 40  min  Charges: OT General Charges $OT Visit: 1 Procedure OT Treatments $Self Care/Home Management : 23-37 mins $Therapeutic Activity: 8-22 mins  Hulda Humphrey OTR/L [facility] 06/29/2016, 11:32 AM

## 2016-06-29 NOTE — Care Management Note (Signed)
Case Management Note  Patient Details  Name: Travis Huff MRN: [id number] Date of Birth: 08-16-1960  Subjective/Objective:                    Action/Plan: Pt discharging home with his wife and orders for [facility] services. CM met with the patient and his wife and they would like to use [facility]. Santiago Glad with [facility] notified and accepted the referral. Pt states he has a 3 in1 and walker at home. Wife to provide transportation home.   Expected Discharge Date:  06/29/16               Expected Discharge Plan:  [place]  In-House Referral:  NA  Discharge planning Services  CM Consult  Post Acute Care Choice:  Home Health Choice offered to:  Spouse, Patient  DME Arranged:  N/A DME Agency:  NA  HH Arranged:  PT, OT HH Agency:  [place]  Status of Service:  Completed, signed off  If discussed at [place] of Stay Meetings, dates discussed:    Additional Comments:  Pollie Friar, RN 06/29/2016, 12:12 PM

## 2016-06-29 NOTE — Discharge Summary (Signed)
Physician Discharge Summary  Patient ID: Travis Huff MRN: [id number] DOB/AGE: 55-11-1960 55 y.o.  Admit date: 06/23/2016 Discharge date: 06/29/2016  Admission Diagnoses:Thoracolumbar scoliosis with spondylolisthesis, stenosis, lumbago, radiculopathy, sagittal imbalance    Discharge Diagnoses: Thoracolumbar scoliosis with spondylolisthesis, stenosis, lumbago, radiculopathy, sagittal imbalance s/p staged surgeries:Lumbar five -sacral one Anterior lumbar interbody fusion with Dr. Sherren Mocha Early for approach (N/A) Right Lumbar two-three Lumbar three-four Lumbar four-five Anterolateral lumbar interbody fusion (Right) ABDOMINAL EXPOSURE (N/A) and Thoracic ten to Pelvis fixation with AIRO (N/A) APPLICATION OF INTRAOPERATIVE CT SCAN (N/A)    Active Problems:   Scoliosis of thoracolumbar spine   Discharged Condition: good  Hospital Course: Travis Huff was admitted for surgery with dx scoliosis, stenosis, and radiculopathy. Following each of staged procedures, he recovered and mobilized well with therapies. PO Dilaudid manages pain well.   Consults: None  Significant Diagnostic Studies: radiology: X-Ray: intra-operative x-ray and CT  Treatments: surgery: Staged: Lumbar five -sacral one Anterior lumbar interbody fusion with Dr. Sherren Mocha Early for approach (N/A) Right Lumbar two-three Lumbar three-four Lumbar four-five Anterolateral lumbar interbody fusion (Right) ABDOMINAL EXPOSURE (N/A) and Thoracic ten to Pelvis fixation with AIRO (N/A) APPLICATION OF INTRAOPERATIVE CT SCAN (N/A)      Discharge Exam: Blood pressure 118/73, pulse (!) 115, temperature 98.3 F (36.8 C), temperature source Oral, resp. rate 18, height 5\' 11"  (1.803 m), weight (!) 136.4 kg (300 lb 12.8 oz), SpO2 99 %. Alert, conversant, smiling. Transferring to chair with nurse. Pain well-controlled with po Dilaudid and Flexeril. No BM yet, but no distention or report of discomfort. Pt states he would  rather address that at home, hopeful of d/c today. Strength is good BLE. Left abdomen, Right side, and thoraco-lumbar incisions without erythema, swelling, or drainage beneath Dermabond and honeycomb drsgs. Hemovac with minimal o/p overnight.     Disposition: Discharge to home after PT today.  Pt verbalizes understanding of d/c instructions an agrees to call office for 3-4 wk visit scheduling. Rx's for Dilaudid 2mg  and Flexeril 10mg  for prn home use will be eRx'ed to pts pharmacy from office. Pt requests to manage bowels at home, and agrees to call office if no success with OTC laxative/suppository.     Discharge Instructions    AMB Referral to [facility] Management    Complete by:  As directed    Please assign UMR member for post discharge call. Currently at [facility]. Marthenia Rolling, [place], RN,BSN-THN [facility] [phone]   Reason for consult:  Please assign UMR member for post discharge call   Expected date of contact:  1-3 days (reserved for hospital discharges)   Diet - low sodium heart healthy    Complete by:  As directed    Increase activity slowly    Complete by:  As directed      Allergies as of 06/29/2016      Reactions   Dilaudid [hydromorphone Hcl] Itching, Other (See Comments)   Couldn't sleep   Morphine And Related Itching      Medication List    STOP taking these medications   celecoxib 200 MG capsule Commonly known as:  CELEBREX     TAKE these medications   calcium carbonate 500 MG chewable tablet Commonly known as:  TUMS - dosed in mg elemental calcium Chew 3-4 tablets by mouth at bedtime as needed (stomach cramps).   cyclobenzaprine 10 MG tablet Commonly known as:  FLEXERIL Take 1 tablet (10 mg total) by mouth 3 (three) times daily as needed for  muscle spasms. What changed:  when to take this   docusate sodium 100 MG capsule Commonly known as:  COLACE Take 1 capsule (100 mg total) by mouth 2 (two) times daily. What  changed:  when to take this  reasons to take this   DULoxetine HCl 40 MG Cpep Take 40 mg by mouth daily.   lisinopril-hydrochlorothiazide 20-25 MG tablet Commonly known as:  PRINZIDE,ZESTORETIC Take 1 tablet by mouth daily.   Magnesium 500 MG Caps Take 500 mg by mouth at bedtime.   MAGNESIUM PO Take 1 tablet by mouth daily.   nebivolol 10 MG tablet Commonly known as:  BYSTOLIC Take 1 tablet (10 mg total) by mouth daily.   OVER THE COUNTER MEDICATION Apply 1 application topically daily as needed (muscle pain). Theraworx otc muscle rub   oxyCODONE-acetaminophen 5-325 MG tablet Commonly known as:  PERCOCET/ROXICET Take 2 tablets by mouth 2 (two) times daily as needed for moderate pain.   potassium bicarbonate 25 MEQ disintegrating tablet Commonly known as:  K-LYTE Take 25 mEq by mouth 2 (two) times daily.   POTASSIUM PO Take 1 tablet by mouth daily.   sildenafil 100 MG tablet Commonly known as:  VIAGRA Take 1 tablet (100 mg total) by mouth as needed for erectile dysfunction.        Signed: Peggyann Shoals, MD 06/29/2016, 7:35 AM

## 2016-06-29 NOTE — Progress Notes (Signed)
Discharge instructions reviewed with patient/family. All questions answered at this time. RXs at pharmacy to be pick up, verified per wife. Transport home by family.   Ave Filter, RN

## 2016-06-29 NOTE — Progress Notes (Signed)
PM progress note  Pt and wife reinforced in back education.  Pt will need further PT through [facility] then OPPT as pt get out of the home.    06/29/16 1700  PT Time Calculation  PT Start Time (ACUTE ONLY) 1616  PT Stop Time (ACUTE ONLY) 1655  PT Time Calculation (min) (ACUTE ONLY) 39 min  PT General Charges  $$ ACUTE PT VISIT 1 Procedure  PT Treatments  $Gait Training 8-22 mins  $Therapeutic Activity 8-22 mins  $Self Care/Home Management 8-22   06/29/2016  Donnella Sham, PT [phone]  (pager)

## 2016-06-29 NOTE — Progress Notes (Signed)
Patient requested less pain medication over night, he is moving well in and out of bed with minimal assistance. Will get him up in chair for breakfast.

## 2016-06-29 NOTE — Progress Notes (Addendum)
Subjective: Patient reports "I feel great. Today's my best day yet"  Objective: Vital signs in last 24 hours: Temp:  [98.3 F (36.8 C)-98.9 F (37.2 C)] 98.3 F (36.8 C) (06/28 0535) Pulse Rate:  [80-115] 115 (06/28 0535) Resp:  [18-22] 18 (06/28 0535) BP: (91-132)/(48-80) 118/73 (06/28 0535) SpO2:  [94 %-100 %] 99 % (06/28 0535)  Intake/Output from previous day: 06/27 0701 - 06/28 0700 In: 726 [P.O.:720; I.V.:6] Out: 635 [Urine:575; Drains:60] Intake/Output this shift: No intake/output data recorded.  Alert, conversant, smiling. Transferring to chair with nurse. Pain well-controlled with po Dilaudid and Flexeril. No BM yet, but no distention or report of discomfort. Pt states he would rather address that at home, hopeful of d/c today. Strength is good BLE. Left abdomen,  Right side, and thoraco-lumbar incisions without erythema, swelling, or drainage beneath Dermabond and honeycomb drsgs. Hemovac with minimal o/p overnight.   Lab Results:  Recent Labs  06/26/16 1800  WBC 10.5  HGB 10.6*  HCT 32.9*  PLT 155   BMET No results for input(s): NA, K, CL, CO2, GLUCOSE, BUN, CREATININE, CALCIUM in the last 72 hours.  Studies/Results: No results found.  Assessment/Plan: Improving  LOS: 6 days  Per Dr. [name], d/c Hemovac, d/c IV, d/c to home after PT today. Pt verbalizes understanding of d/c instructions an agrees to call office for 3-4 wk visit scheduling. Rx's for Dilaudid 2mg  and Flexeril 10mg  for prn home use.    Verdis Prime 06/29/2016, 7:14 AM    Patient doing well.  Plan on discharge home later today.

## 2016-06-30 ENCOUNTER — Encounter: Payer: Self-pay | Admitting: *Deleted

## 2016-06-30 ENCOUNTER — Other Ambulatory Visit: Payer: Self-pay | Admitting: *Deleted

## 2016-06-30 DIAGNOSIS — Z4789 Encounter for other orthopedic aftercare: Secondary | ICD-10-CM | POA: Diagnosis not present

## 2016-06-30 DIAGNOSIS — M48061 Spinal stenosis, lumbar region without neurogenic claudication: Secondary | ICD-10-CM | POA: Diagnosis not present

## 2016-06-30 DIAGNOSIS — M4185 Other forms of scoliosis, thoracolumbar region: Secondary | ICD-10-CM | POA: Diagnosis not present

## 2016-06-30 NOTE — Patient Outreach (Signed)
[name]) Care Management  06/30/2016  Travis Huff [birth year] [id number]   Subjective: Telephone call to patient's home number, spoke with patient, and HIPAA verified.  Discussed [facility] Care Management UMR Transition of care follow up, patient voiced understanding, and is in agreement to follow up.   Patient gave [facility] verbal authorization to speak with wife Travis Huff) regarding his healthcare needs as needed. Patient states this not a good time to talk, not feeling well, and requested that [place] speak with wife.  Spoke with patient's wife Travis Huff), states patient is not having a good day, had a terrible night, has not had a bowel movement since 06/25/16, has abdominal distention, pain level is 10 out of 10, just took pain medication after he spoke with this RNCM, pain is at the surgical site, and has received a call from [name] that home care services will start today.   Wife states patient also had blood pressure problems in the hospital , currently being addressed.    RNCM educated wife on reporting above symptoms to MD as soon as possible, wife voices understanding, and states she will follow up.  Wife states she will call surgeon's office, call primary MD's office today to report symptoms, seek direction on next steps, and set hospital follow up appointments.   States she will also call [name] pharmacist regarding patient's over the counter potassium question.    Wife states patient is accessing the following [facility] benefits: outpatient pharmacy, hospital indemnity supplemental insurance wife is not sure if she purchased this coverage, RNCM verbally gave wife contact number for [place] insurance [phone]), wife states she will contact [employer] and file claims if appropriate, wife has approved family medical leave act (FMLA) through [employer].  Wife had questions regarding PAL (paid annual leave) coverage for benefits  while she is out on FMLA, RNCM verbally provided wife with contact numbers for [facility] Benefit Specialist [phone]) and [facility] [phone]), wife voices understanding, and states she will follow up.  Wife states patient does not have any education material, transition of care, care coordination, disease management, disease monitoring, transportation, community resource, or pharmacy needs at this time.  States she is very appreciative of the follow up and is in agreement to receive [name] Management information on patient's behalf.   Wife also stated, she and her husband are very sorry for being rude to this RNCM at the beginning of the phone call, because there was a lot going on with patient at the time of the call, did not mean to take it out on  this RNCM , RNCM provided great information, and they very appreciative RNCM's patience, and understanding.   RNCM advised wife no apologies needed and totally understanding wife's and patient's situation.    Objective:  Per chart and KPN (point of care tool ) review, patient hospitalized 06/23/16 -06/29/16 for Thoracolumbar scoliosis with spondylolisthesis, stenosis, lumbago, radiculopathy, and sagittal imbalance.  Status post Anterior exposure for L5-S1 disc fusion, Right  Lumbar two-three Lumbar three-four Lumbar four-five Anterolateral lumbar interbody fusion (Right) ABDOMINAL EXPOSURE ,and Thoracic ten to Pelvis fixation with AIRO on 06/23/16.    Preoperative [facility] Care Management follow up completed on 06/14/16.     Assessment: Received UMR Transition of care referral on 06/23/16.  Transition of care follow up completed, no care management needs, and will proceed with case closure.    Plan: RNCM will send patient successful outreach letter, [facility] pamphlet,  and magnet. RNCM will send case closure due to follow up completed / no care management needs request to [facility].   Travis Huff. Annia Friendly, BSN, [facility] Telephonic CM Phone: [phone] Fax: [phone]

## 2016-07-03 DIAGNOSIS — M4185 Other forms of scoliosis, thoracolumbar region: Secondary | ICD-10-CM | POA: Diagnosis not present

## 2016-07-03 DIAGNOSIS — Z4789 Encounter for other orthopedic aftercare: Secondary | ICD-10-CM | POA: Diagnosis not present

## 2016-07-03 DIAGNOSIS — M48061 Spinal stenosis, lumbar region without neurogenic claudication: Secondary | ICD-10-CM | POA: Diagnosis not present

## 2016-07-06 ENCOUNTER — Encounter (HOSPITAL_COMMUNITY): Payer: Self-pay | Admitting: Neurosurgery

## 2016-07-07 DIAGNOSIS — Z4789 Encounter for other orthopedic aftercare: Secondary | ICD-10-CM | POA: Diagnosis not present

## 2016-07-07 DIAGNOSIS — M4185 Other forms of scoliosis, thoracolumbar region: Secondary | ICD-10-CM | POA: Diagnosis not present

## 2016-07-07 DIAGNOSIS — M48061 Spinal stenosis, lumbar region without neurogenic claudication: Secondary | ICD-10-CM | POA: Diagnosis not present

## 2016-07-10 ENCOUNTER — Ambulatory Visit: Payer: 59 | Admitting: Sports Medicine

## 2016-07-11 DIAGNOSIS — Z4789 Encounter for other orthopedic aftercare: Secondary | ICD-10-CM | POA: Diagnosis not present

## 2016-07-11 DIAGNOSIS — M48061 Spinal stenosis, lumbar region without neurogenic claudication: Secondary | ICD-10-CM | POA: Diagnosis not present

## 2016-07-11 DIAGNOSIS — M4185 Other forms of scoliosis, thoracolumbar region: Secondary | ICD-10-CM | POA: Diagnosis not present

## 2016-07-13 DIAGNOSIS — M4185 Other forms of scoliosis, thoracolumbar region: Secondary | ICD-10-CM | POA: Diagnosis not present

## 2016-07-13 DIAGNOSIS — Z4789 Encounter for other orthopedic aftercare: Secondary | ICD-10-CM | POA: Diagnosis not present

## 2016-07-13 DIAGNOSIS — M48061 Spinal stenosis, lumbar region without neurogenic claudication: Secondary | ICD-10-CM | POA: Diagnosis not present

## 2016-07-17 ENCOUNTER — Other Ambulatory Visit: Payer: Self-pay | Admitting: Sports Medicine

## 2016-07-17 DIAGNOSIS — I10 Essential (primary) hypertension: Secondary | ICD-10-CM

## 2016-07-17 MED FILL — LISINOPRIL-HCTZ 20-25 MG TA: 20-25 | 90 days supply | Qty: 90 | Fill #0

## 2016-07-18 DIAGNOSIS — M48061 Spinal stenosis, lumbar region without neurogenic claudication: Secondary | ICD-10-CM | POA: Diagnosis not present

## 2016-07-18 DIAGNOSIS — Z4789 Encounter for other orthopedic aftercare: Secondary | ICD-10-CM | POA: Diagnosis not present

## 2016-07-18 DIAGNOSIS — M4185 Other forms of scoliosis, thoracolumbar region: Secondary | ICD-10-CM | POA: Diagnosis not present

## 2016-07-19 DIAGNOSIS — M419 Scoliosis, unspecified: Secondary | ICD-10-CM | POA: Diagnosis not present

## 2016-07-19 DIAGNOSIS — M4316 Spondylolisthesis, lumbar region: Secondary | ICD-10-CM | POA: Diagnosis not present

## 2016-07-19 DIAGNOSIS — M48062 Spinal stenosis, lumbar region with neurogenic claudication: Secondary | ICD-10-CM | POA: Diagnosis not present

## 2016-07-19 DIAGNOSIS — Z6838 Body mass index (BMI) 38.0-38.9, adult: Secondary | ICD-10-CM | POA: Diagnosis not present

## 2016-07-19 DIAGNOSIS — M5416 Radiculopathy, lumbar region: Secondary | ICD-10-CM | POA: Diagnosis not present

## 2016-07-19 DIAGNOSIS — I10 Essential (primary) hypertension: Secondary | ICD-10-CM | POA: Diagnosis not present

## 2016-07-19 MED FILL — HYDROmorphone HCL 2 MG TABS: 2 | 17 days supply | Qty: 100 | Fill #0

## 2016-07-20 ENCOUNTER — Ambulatory Visit (INDEPENDENT_AMBULATORY_CARE_PROVIDER_SITE_OTHER): Payer: Self-pay | Admitting: Vascular Surgery

## 2016-07-20 ENCOUNTER — Encounter (HOSPITAL_COMMUNITY): Payer: Self-pay

## 2016-07-20 ENCOUNTER — Ambulatory Visit (HOSPITAL_COMMUNITY)
Admission: RE | Admit: 2016-07-20 | Discharge: 2016-07-20 | Disposition: A | Discharge status: Home / Self Care | Payer: 59 | Source: Ambulatory Visit | Attending: Vascular Surgery | Admitting: Vascular Surgery

## 2016-07-20 ENCOUNTER — Other Ambulatory Visit: Payer: Self-pay | Admitting: *Deleted

## 2016-07-20 ENCOUNTER — Other Ambulatory Visit: Payer: Self-pay | Admitting: Vascular Surgery

## 2016-07-20 DIAGNOSIS — I82442 Acute embolism and thrombosis of left tibial vein: Secondary | ICD-10-CM | POA: Diagnosis not present

## 2016-07-20 DIAGNOSIS — M4316 Spondylolisthesis, lumbar region: Secondary | ICD-10-CM

## 2016-07-20 DIAGNOSIS — I82449 Acute embolism and thrombosis of unspecified tibial vein: Secondary | ICD-10-CM

## 2016-07-20 DIAGNOSIS — L819 Disorder of pigmentation, unspecified: Secondary | ICD-10-CM

## 2016-07-20 MED ORDER — RIVAROXABAN (XARELTO) VTE STARTER PACK (15 & 20 MG): ORAL_TABLET | ORAL | 0 refills | Status: DC

## 2016-07-20 MED FILL — XARELTO STARTER PACK: 15 & 20 | 30 days supply | Qty: 51 | Fill #0

## 2016-07-20 NOTE — Progress Notes (Signed)
Patient name: Travis Huff MRN: [id number] DOB: [birth year] Sex: male  REASON FOR VISIT: Evaluation for changes in lower extremities  HPI: Travis Huff is a 56 y.o. male known to me from recent anterior exposure for L5-S1 disc surgery. He had initially a anterior approach and then extensive posterior spurring approach for extensive spine issues. His wife is with him today. She reports that he did notice some bilateral lower from the mottling at the time of his postoperative recovery. I he still has some of this in his left leg but this is less prominent. She is noted no major swelling but does have some dependency swelling at the end of the day. Specifically denies any calf pain. Reports that he is been very shaky and unstable and weak in general following his surgical recovery.  Current Outpatient Prescriptions  Medication Sig Dispense Refill  . calcium carbonate (TUMS - DOSED IN MG ELEMENTAL CALCIUM) 500 MG chewable tablet Chew 3-4 tablets by mouth at bedtime as needed (stomach cramps).    . cyclobenzaprine (FLEXERIL) 10 MG tablet Take 1 tablet (10 mg total) by mouth 3 (three) times daily as needed for muscle spasms. (Patient taking differently: Take 10 mg by mouth at bedtime. ) 90 tablet 3  . docusate sodium (COLACE) 100 MG capsule Take 1 capsule (100 mg total) by mouth 2 (two) times daily. (Patient taking differently: Take 100 mg by mouth daily as needed for mild constipation. ) 60 capsule 0  . DULoxetine 40 MG CPEP Take 40 mg by mouth daily. 30 capsule 3  . HYDROmorphone (DILAUDID) 2 MG tablet Take 2 mg by mouth every 4 (four) hours as needed for severe pain.    [name] lisinopril-hydrochlorothiazide (PRINZIDE,ZESTORETIC) 20-25 MG tablet TAKE 1 TABLET BY MOUTH DAILY. 90 tablet 3  . Magnesium 500 MG CAPS Take 500 mg by mouth at bedtime.    [name] MAGNESIUM PO Take 1 tablet by mouth daily.    . nebivolol (BYSTOLIC) 10 MG tablet Take 1 tablet (10 mg total) by mouth  daily. 90 tablet 3  . OVER THE COUNTER MEDICATION Apply 1 application topically daily as needed (muscle pain). Theraworx otc muscle rub    . oxyCODONE-acetaminophen (PERCOCET/ROXICET) 5-325 MG tablet Take 2 tablets by mouth 2 (two) times daily as needed for moderate pain.   0  . potassium bicarbonate (K-LYTE) 25 MEQ disintegrating tablet Take 25 mEq by mouth 2 (two) times daily.    [name] POTASSIUM PO Take 1 tablet by mouth daily.    . Rivaroxaban 15 & 20 MG TBPK Take as directed on package: Start with one 15mg  tablet by mouth twice a day with food. On Day 22, switch to one 20mg  tablet once a day with food. 51 each 0  . sildenafil (VIAGRA) 100 MG tablet Take 1 tablet (100 mg total) by mouth as needed for erectile dysfunction. 10 tablet 11   No current facility-administered medications for this visit.      PHYSICAL EXAM: There were no vitals filed for this visit.  GENERAL: The patient is a well-nourished male, in no acute distress. The vital signs are documented above. Mild bilateral lower should be swelling. No calf tenderness bilaterally. Palpable pedal pulses bilaterally.  Bilateral motion the venous duplex today reveals no DVT in the right leg. He does have thrombus in the posterior tibial veins from the distal To the proximal calf.  MEDICAL ISSUES: I discussed this at length with the patient and his wife present. I feel that this  is acute DVT most likely as a result of his immobility following surgery. Explained that he certainly could have DVT associated with mobilization of his iliac veins with this would generally be more proximal venous thrombus with more long-term sequela. I have recommended that we start him Xarelto today. 15 mg twice a day for 21 days and then 20 mg daily for an additional 2 months.  His wife is concerned regarding some increased dyspnea on exertion. Ambien Unlikely for pulmonary list associated with this but certainly fields appropriate to proceed with CT angiogram to  rule out pulmonary embolus. We will coordinate this and then let him know of his results. I will see him again in 3 months for continued follow-up   Rosetta Posner, MD [facility] Tel [phone] Pager [phone]

## 2016-07-21 ENCOUNTER — Telehealth: Payer: Self-pay | Admitting: *Deleted

## 2016-07-21 ENCOUNTER — Other Ambulatory Visit: Payer: Self-pay | Admitting: *Deleted

## 2016-07-21 ENCOUNTER — Ambulatory Visit (HOSPITAL_COMMUNITY)
Admission: RE | Admit: 2016-07-21 | Discharge: 2016-07-21 | Disposition: A | Discharge status: Home / Self Care | Payer: 59 | Source: Ambulatory Visit | Attending: Vascular Surgery | Admitting: Vascular Surgery

## 2016-07-21 DIAGNOSIS — I82442 Acute embolism and thrombosis of left tibial vein: Secondary | ICD-10-CM

## 2016-07-21 DIAGNOSIS — I517 Cardiomegaly: Secondary | ICD-10-CM | POA: Insufficient documentation

## 2016-07-21 DIAGNOSIS — I82449 Acute embolism and thrombosis of unspecified tibial vein: Secondary | ICD-10-CM | POA: Insufficient documentation

## 2016-07-21 DIAGNOSIS — E278 Other specified disorders of adrenal gland: Secondary | ICD-10-CM | POA: Diagnosis not present

## 2016-07-21 DIAGNOSIS — I2699 Other pulmonary embolism without acute cor pulmonale: Secondary | ICD-10-CM

## 2016-07-21 DIAGNOSIS — J90 Pleural effusion, not elsewhere classified: Secondary | ICD-10-CM | POA: Diagnosis not present

## 2016-07-21 DIAGNOSIS — J9811 Atelectasis: Secondary | ICD-10-CM | POA: Insufficient documentation

## 2016-07-21 DIAGNOSIS — Z981 Arthrodesis status: Secondary | ICD-10-CM | POA: Insufficient documentation

## 2016-07-21 DIAGNOSIS — Z48812 Encounter for surgical aftercare following surgery on the circulatory system: Secondary | ICD-10-CM

## 2016-07-21 MED ORDER — IOPAMIDOL (ISOVUE-370) INJECTION 76%
100.0000 mL | Freq: Once | INTRAVENOUS | Status: AC | PRN
  Administered 2016-07-21: 100 mL via INTRAVENOUS

## 2016-07-21 MED ORDER — RIVAROXABAN 20 MG PO TABS: 20.0000 mg | ORAL_TABLET | Freq: Every day | ORAL | 5 refills | Status: DC

## 2016-07-21 MED ORDER — IOPAMIDOL (ISOVUE-370) INJECTION 76%
INTRAVENOUS | Status: AC
  Filled 2016-07-21: qty 100

## 2016-07-21 NOTE — Telephone Encounter (Signed)
Received a call from Radiology at [facility] (Dr. Mansell)regarding CT Lung to r/o PE. Patient does have segmental PE findings:   CT ANGIO CHEST PE W OR WO CONTRAST (Accession [id number]) (Order [id number])  Imaging  Date: 07/21/2016 Department: [facility]-CT IMAGING Released By: Travis Huff Authorizing: Early, Arvilla Meres, MD  Exam Information   Status Exam Begun  Exam Ended   Final [99] 07/21/2016 4:00 PM 07/21/2016 4:20 PM  PACS Images   Show images for CT ANGIO CHEST PE W OR WO CONTRAST  Study Result   CLINICAL DATA:  DVT.  Evaluate for pulmonary embolus.  EXAM: CT ANGIOGRAPHY CHEST WITH CONTRAST  TECHNIQUE: Multidetector CT imaging of the chest was performed using the standard protocol during bolus administration of intravenous contrast. Multiplanar CT image reconstructions and MIPs were obtained to evaluate the vascular anatomy.  CONTRAST:  100 cc Isovue 370  COMPARISON:  None.  FINDINGS: Cardiovascular: The heart is enlarged. No pericardial effusion no thoracic aortic aneurysm. Filling defect identified in the segmental and subsegmental pulmonary arteries to the posterior right lower lobe is consistent with acute pulmonary embolus see image 169 of series 5). Subsegmental pulmonary embolus also identified posterior left lower lobe. Patient noted to have anomalous pulmonary venous return involving the left upper lobe with left upper lobe pulmonary vein coursing into the left innominate vein.  Mediastinum/Nodes: No mediastinal lymphadenopathy. There is no hilar lymphadenopathy. The esophagus has normal imaging features.  Lungs/Pleura: Scattered areas of subsegmental atelectasis noted in the right middle lobe and left lower lobe posteriorly. Small left pleural effusion evident.  Upper Abdomen: 2.7 cm left adrenal nodule cannot be definitively characterized on today's study but was present on a lumbar spine MRI from 05/06/2011 when it  measured 2.0 cm. Chronicity suggests benign etiology such is adenoma.  Musculoskeletal: Extensive thoracolumbar fusion hardware incompletely visualized. At a [place] duct missed cell also  Review of the MIP images confirms the above findings.  IMPRESSION: 1. Acute pulmonary embolus identified in segmental and subsegmental pulmonary arteries to the lower lobes. 2. Cardiomegaly. 3. Anomalous pulmonary venous return involving the left upper lobe. 4. 2.7 cm left adrenal nodule increased from 2.0 cm since an MRI 05/06/2011. This 5 years of relative stability suggests a benign etiology and likely adenoma. MRI without and with contrast could be performed to further evaluate as clinically warranted. Critical Value/emergent results were called by telephone at the time of interpretation on 07/21/2016 at 4:40 pm to Dr. Luther Huff nurse, Travis Huff , who verbally acknowledged these results.   Electronically Signed   By: Travis Huff M.Huff.   On: 07/21/2016 16:41   O Signed Date/Time  Phone Pager  Travis Huff 07/21/2016 4:41 PM [phone]  Signed   Electronically signed by Travis Stanley, MD on 07/21/16 at 1641 EDT         I called Travis Huff and he has ordered that the patient will be on Xarelto for at least 6 months. This was started yesterday after patient had a finding of thrombus in both left posterior tibial veins from distal calf to proximal calf.  I spoke to Travis Huff and relayed this information from Travis Huff. She voiced understanding and agreement with this plan. She will call us next week to get a 2 month follow up appt with Travis Huff and repeat DVT study.

## 2016-07-24 NOTE — Telephone Encounter (Signed)
Sched appt 10/03/16; lab at 2:00 and MD at 2:45. Mailed letter.

## 2016-07-25 DIAGNOSIS — M4185 Other forms of scoliosis, thoracolumbar region: Secondary | ICD-10-CM | POA: Diagnosis not present

## 2016-07-25 DIAGNOSIS — Z4789 Encounter for other orthopedic aftercare: Secondary | ICD-10-CM | POA: Diagnosis not present

## 2016-07-25 DIAGNOSIS — M48061 Spinal stenosis, lumbar region without neurogenic claudication: Secondary | ICD-10-CM | POA: Diagnosis not present

## 2016-08-01 MED FILL — GABAPENTIN 100 MG CAPSULE: 100 | 43 days supply | Qty: 120 | Fill #0

## 2016-08-08 MED FILL — CYCLOBENZAPRINE 10 MG TABLE: 10 | 30 days supply | Qty: 90 | Fill #1

## 2016-08-10 MED FILL — XARELTO 20 MG TABLET: 20 | 30 days supply | Qty: 30 | Fill #0

## 2016-08-18 MED FILL — SILDENAFIL CITRATE 100 MG T: 100 | 30 days supply | Qty: 6 | Fill #3

## 2016-08-23 MED FILL — DULoxetine HCL 20 MG CPEP: 20 | 30 days supply | Qty: 60 | Fill #1

## 2016-09-06 DIAGNOSIS — M419 Scoliosis, unspecified: Secondary | ICD-10-CM | POA: Diagnosis not present

## 2016-09-06 DIAGNOSIS — M545 Low back pain: Secondary | ICD-10-CM | POA: Diagnosis not present

## 2016-09-06 DIAGNOSIS — M5416 Radiculopathy, lumbar region: Secondary | ICD-10-CM | POA: Diagnosis not present

## 2016-09-06 DIAGNOSIS — Z6841 Body Mass Index (BMI) 40.0 and over, adult: Secondary | ICD-10-CM | POA: Diagnosis not present

## 2016-09-06 DIAGNOSIS — I10 Essential (primary) hypertension: Secondary | ICD-10-CM | POA: Diagnosis not present

## 2016-09-06 MED FILL — CYCLOBENZAPRINE 10 MG TAB: 10 | 30 days supply | Qty: 90 | Fill #0

## 2016-09-06 MED FILL — GABAPENTIN 300 MG CAPSULE: 300 | 30 days supply | Qty: 90 | Fill #0

## 2016-09-08 MED FILL — HYDROmorphone HCL 2 MG TABS: 2 | 16 days supply | Qty: 100 | Fill #0

## 2016-09-26 ENCOUNTER — Other Ambulatory Visit: Payer: Self-pay | Admitting: Sports Medicine

## 2016-09-26 DIAGNOSIS — I10 Essential (primary) hypertension: Secondary | ICD-10-CM

## 2016-09-26 MED FILL — BYSTOLIC 10 MG TABLET: 10 | 90 days supply | Qty: 90 | Fill #0

## 2016-10-03 ENCOUNTER — Ambulatory Visit: Payer: 59 | Admitting: Vascular Surgery

## 2016-10-03 ENCOUNTER — Encounter (HOSPITAL_COMMUNITY): Payer: 59

## 2016-10-11 MED FILL — CYCLOBENZAPRINE 10 MG TAB: 10 | 30 days supply | Qty: 90 | Fill #1

## 2016-10-11 MED FILL — GABAPENTIN 300 MG CAPSULE: 300 | 30 days supply | Qty: 90 | Fill #1

## 2016-10-11 MED FILL — LISINOPRIL-HCTZ 20-25 MG TA: 20-25 | 90 days supply | Qty: 90 | Fill #1

## 2016-10-11 MED FILL — SILDENAFIL CITRATE 100 MG T: 100 | 30 days supply | Qty: 6 | Fill #4

## 2016-10-12 MED FILL — XARELTO 20 MG TABLET: 20 | 30 days supply | Qty: 30 | Fill #1

## 2016-10-26 DIAGNOSIS — M419 Scoliosis, unspecified: Secondary | ICD-10-CM | POA: Diagnosis not present

## 2016-10-31 ENCOUNTER — Ambulatory Visit (INDEPENDENT_AMBULATORY_CARE_PROVIDER_SITE_OTHER): Payer: 59 | Admitting: Vascular Surgery

## 2016-10-31 ENCOUNTER — Ambulatory Visit (HOSPITAL_COMMUNITY)
Admission: RE | Admit: 2016-10-31 | Discharge: 2016-10-31 | Disposition: A | Discharge status: Home / Self Care | Payer: 59 | Source: Ambulatory Visit | Attending: Vascular Surgery | Admitting: Vascular Surgery

## 2016-10-31 ENCOUNTER — Encounter: Payer: Self-pay | Admitting: Vascular Surgery

## 2016-10-31 VITALS — BP 127/91 | HR 89 | Temp 99.1°F | Resp 20 | Ht 71.0 in | Wt 317.0 lb

## 2016-10-31 DIAGNOSIS — Z48812 Encounter for surgical aftercare following surgery on the circulatory system: Secondary | ICD-10-CM | POA: Diagnosis present

## 2016-10-31 DIAGNOSIS — I82442 Acute embolism and thrombosis of left tibial vein: Secondary | ICD-10-CM

## 2016-10-31 NOTE — Progress Notes (Signed)
[facility] [place]     History of Present Illness  Travis Huff is a 56 y.o. male who presents for a follow up visit s/p anterior lumbar exposure followed by left DVT and PE.  He has been on Xarelto since July.  He reports no swelling or pain in the calf.  He has no SOB.       Past Medical History:  Diagnosis Date  . Arthritis   . Asthma   . GERD (gastroesophageal reflux disease)    uses tums  . Hypertension   . Viral pericarditis     Past Surgical History:  Procedure Laterality Date  . ABDOMINAL EXPOSURE N/A 06/23/2016   Procedure: ABDOMINAL EXPOSURE;  Surgeon: Rosetta Posner, MD;  Location: [facility];  Service: Vascular;  Laterality: N/A;  . ANTERIOR LATERAL LUMBAR FUSION 4 LEVELS Right 06/23/2016   Procedure: Right  Lumbar two-three Lumbar three-four Lumbar four-five Anterolateral lumbar interbody fusion;  Surgeon: Erline Levine, MD;  Location: [facility];  Service: Neurosurgery;  Laterality: Right;  . ANTERIOR LUMBAR FUSION N/A 06/23/2016   Procedure: Lumbar five -sacral one  Anterior lumbar interbody fusion with Dr. Sherren Mocha Early for approach;  Surgeon: Erline Levine, MD;  Location: [facility];  Service: Neurosurgery;  Laterality: N/A;  . APPLICATION OF INTRAOPERATIVE CT SCAN N/A 06/26/2016   Procedure: APPLICATION OF INTRAOPERATIVE CT SCAN;  Surgeon: Erline Levine, MD;  Location: [facility];  Service: Neurosurgery;  Laterality: N/A;  . CARDIOVASCULAR STRESS TEST     Negative in May 2014  . COLONOSCOPY    . HERNIA REPAIR    . JOINT REPLACEMENT    . KNEE SURGERY    . POSTERIOR LUMBAR FUSION 4 LEVEL N/A 06/26/2016   Procedure: Thoracic ten to Pelvis fixation with AIRO;  Surgeon: Erline Levine, MD;  Location: [facility];  Service: Neurosurgery;  Laterality: N/A;  . TENDON REPAIR     right hand  . TOTAL HIP ARTHROPLASTY Right 05/25/2014   Procedure: RIGHT TOTAL HIP ARTHROPLASTY ANTERIOR APPROACH;  Surgeon: Rod Can, MD;  Location: [facility];  Service: Orthopedics;  Laterality:  Right;  . TOTAL KNEE ARTHROPLASTY Bilateral     Social History   Social History  . Marital status: Married    Spouse name: N/A  . Number of children: N/A  . Years of education: N/A   Occupational History  . Not on file.   Social History Main Topics  . Smoking status: Former Smoker    Packs/day: 1.00    Years: 25.00    Types: Cigarettes    Quit date: 2000  . Smokeless tobacco: Current User    Types: Snuff  . Alcohol use 8.4 oz/week    14 Cans of beer per week     Comment: 2 beers QD  . Drug use: No  . Sexual activity: Not on file   Other Topics Concern  . Not on file   Social History Narrative  . No narrative on file    Family History  Problem Relation Age of Onset  . Adopted: Yes    Current Outpatient Prescriptions on File Prior to Visit  Medication Sig Dispense Refill  . BYSTOLIC 10 MG tablet TAKE 1 TABLET (10 MG TOTAL) BY MOUTH DAILY. 90 tablet 3  . calcium carbonate (TUMS - DOSED IN MG ELEMENTAL CALCIUM) 500 MG chewable tablet Chew 3-4 tablets by mouth at bedtime as needed (stomach cramps).    . cyclobenzaprine (FLEXERIL) 10 MG tablet Take 1 tablet (10  mg total) by mouth 3 (three) times daily as needed for muscle spasms. (Patient taking differently: Take 10 mg by mouth at bedtime. ) 90 tablet 3  . docusate sodium (COLACE) 100 MG capsule Take 1 capsule (100 mg total) by mouth 2 (two) times daily. (Patient taking differently: Take 100 mg by mouth daily as needed for mild constipation. ) 60 capsule 0  . DULoxetine 40 MG CPEP Take 40 mg by mouth daily. 30 capsule 3  . HYDROmorphone (DILAUDID) 2 MG tablet Take 2 mg by mouth every 4 (four) hours as needed for severe pain.    [name] lisinopril-hydrochlorothiazide (PRINZIDE,ZESTORETIC) 20-25 MG tablet TAKE 1 TABLET BY MOUTH DAILY. 90 tablet 3  . Magnesium 500 MG CAPS Take 500 mg by mouth at bedtime.    [name] OVER THE COUNTER MEDICATION Apply 1 application topically daily as needed (muscle pain). Theraworx otc muscle rub    .  oxyCODONE-acetaminophen (PERCOCET/ROXICET) 5-325 MG tablet Take 2 tablets by mouth 2 (two) times daily as needed for moderate pain.   0  . potassium bicarbonate (K-LYTE) 25 MEQ disintegrating tablet Take 25 mEq by mouth 2 (two) times daily.    [name] POTASSIUM PO Take 1 tablet by mouth daily.    . rivaroxaban (XARELTO) 20 MG TABS tablet Take 1 tablet (20 mg total) by mouth daily with supper. 30 tablet 5  . Rivaroxaban 15 & 20 MG TBPK Take as directed on package: Start with one 15mg  tablet by mouth twice a day with food. On Day 22, switch to one 20mg  tablet once a day with food. 51 each 0  . sildenafil (VIAGRA) 100 MG tablet Take 1 tablet (100 mg total) by mouth as needed for erectile dysfunction. 10 tablet 11  . MAGNESIUM PO Take 1 tablet by mouth daily.     No current facility-administered medications on file prior to visit.     Allergies as of 10/31/2016 - Review Complete 10/31/2016  Allergen Reaction Noted  . Dilaudid [hydromorphone hcl] Itching and Other (See Comments) 06/23/2013  . Morphine and related Itching 12/21/2013     ROS:   General:  No weight loss, Fever, chills  HEENT: No recent headaches, no nasal bleeding, no visual changes, no sore throat  Neurologic: No dizziness, blackouts, seizures. No recent symptoms of stroke or mini- stroke. No recent episodes of slurred speech, or temporary blindness.  Cardiac: No recent episodes of chest pain/pressure, no shortness of breath at rest.  No shortness of breath with exertion.  Denies history of atrial fibrillation or irregular heartbeat  Vascular: No history of rest pain in feet.  No history of claudication.  No history of non-healing ulcer, No history of DVT   Pulmonary: No home oxygen, no productive cough, no hemoptysis,  No asthma or wheezing  Musculoskeletal:  [ ]  Arthritis, [x ] Low back pain,  [ ]  Joint pain  Hematologic:No history of hypercoagulable state.  No history of easy bleeding.  No history of  anemia  Gastrointestinal: No hematochezia or melena,  No gastroesophageal reflux, no trouble swallowing  Urinary: [ ]  chronic Kidney disease, [ ]  on HD - [ ]  MWF or [ ]  TTHS, [ ]  Burning with urination, [ ]  Frequent urination, [ ]  Difficulty urinating;   Skin: No rashes  Psychological: No history of anxiety,  No history of depression  Physical Examination  Vitals:   10/31/16 1558  BP: (!) 127/91  Pulse: 89  Resp: 20  Temp: 99.1 F (37.3 C)  TempSrc: Oral  SpO2: 93%  Weight: (!) 317 lb (143.8 kg)  Height: 5\' 11"  (1.803 m)    Body mass index is 44.21 kg/m.  General:  Alert and oriented, no acute distress HEENT: Normal Neck: No bruit or JVD Pulmonary: Clear to auscultation bilaterally Cardiac: Regular Rate and Rhythm without murmur Abdomen: Soft, non-tender, non-distended, no mass, well healed scars Skin: No rash Extremity Pulses:  2+ radial, brachial, femoral, dorsalis pedis, posterior tibial pulses bilaterally Musculoskeletal: No deformity or edema  Neurologic: Upper and lower extremity motor 5/5 and symmetric  DATA: DVT  Left posterior tibial from distal calf to proximal calf without propagation.  Assessment: S/P anterior lumbar fusion exposure Followed by thoracolumbar fusion T10 to pelvis fixation PE and left LE DVT  Plan: We discussed that the DVT is still there, but there is no propagation.  Recommendation to continue anticoagulation for 6 months total and then he can discontinue it.    Kawthar Ennen MAUREEN PA-C [facility]  The patient was seen in conjunction with Dr. Donnetta Hutching today.  I have examined the patient, reviewed and agree with above.  Doing well overall.  Would continue anticoagulation for a total of 6 months and then discontinue this.  This would place him for discontinuation at the end of 2019.  He will see [place] again on an as needed basis  Curt Jews, MD 10/31/2016 5:07 PM

## 2016-11-06 DIAGNOSIS — M5416 Radiculopathy, lumbar region: Secondary | ICD-10-CM | POA: Diagnosis not present

## 2016-11-06 DIAGNOSIS — I69993 Ataxia following unspecified cerebrovascular disease: Secondary | ICD-10-CM | POA: Diagnosis not present

## 2016-11-06 DIAGNOSIS — M419 Scoliosis, unspecified: Secondary | ICD-10-CM | POA: Diagnosis not present

## 2016-11-06 DIAGNOSIS — R2689 Other abnormalities of gait and mobility: Secondary | ICD-10-CM | POA: Diagnosis not present

## 2016-11-07 MED FILL — CYCLOBENZAPRINE 10 MG TAB: 10 | 30 days supply | Qty: 90 | Fill #2

## 2016-11-10 MED FILL — GABAPENTIN 300 MG CAPSULE: 300 | 30 days supply | Qty: 90 | Fill #2

## 2016-11-16 MED FILL — SILDENAFIL CITRATE 100 MG T: 100 | 30 days supply | Qty: 6 | Fill #5

## 2016-11-20 MED FILL — XARELTO 20 MG TABLET: 20 | 30 days supply | Qty: 30 | Fill #2

## 2016-11-28 ENCOUNTER — Ambulatory Visit: Payer: 59 | Attending: Neurosurgery | Admitting: Physical Therapy

## 2016-11-28 ENCOUNTER — Encounter: Payer: Self-pay | Admitting: Physical Therapy

## 2016-11-28 DIAGNOSIS — R29898 Other symptoms and signs involving the musculoskeletal system: Secondary | ICD-10-CM | POA: Insufficient documentation

## 2016-11-28 DIAGNOSIS — R2689 Other abnormalities of gait and mobility: Secondary | ICD-10-CM | POA: Diagnosis not present

## 2016-11-28 DIAGNOSIS — R2681 Unsteadiness on feet: Secondary | ICD-10-CM | POA: Insufficient documentation

## 2016-11-28 DIAGNOSIS — R42 Dizziness and giddiness: Secondary | ICD-10-CM | POA: Insufficient documentation

## 2016-11-29 ENCOUNTER — Ambulatory Visit (INDEPENDENT_AMBULATORY_CARE_PROVIDER_SITE_OTHER): Payer: 59

## 2016-11-29 ENCOUNTER — Encounter: Payer: Self-pay | Admitting: Sports Medicine

## 2016-11-29 ENCOUNTER — Ambulatory Visit (INDEPENDENT_AMBULATORY_CARE_PROVIDER_SITE_OTHER): Payer: 59 | Admitting: Sports Medicine

## 2016-11-29 VITALS — BP 162/77 | HR 100 | Resp 18 | Wt 321.0 lb

## 2016-11-29 DIAGNOSIS — S92902B Unspecified fracture of left foot, initial encounter for open fracture: Secondary | ICD-10-CM | POA: Insufficient documentation

## 2016-11-29 DIAGNOSIS — S92425A Nondisplaced fracture of distal phalanx of left great toe, initial encounter for closed fracture: Secondary | ICD-10-CM | POA: Diagnosis not present

## 2016-11-29 DIAGNOSIS — Z23 Encounter for immunization: Secondary | ICD-10-CM | POA: Diagnosis not present

## 2016-11-29 DIAGNOSIS — W19XXXA Unspecified fall, initial encounter: Secondary | ICD-10-CM | POA: Diagnosis not present

## 2016-11-29 DIAGNOSIS — S99922A Unspecified injury of left foot, initial encounter: Secondary | ICD-10-CM | POA: Diagnosis not present

## 2016-11-29 DIAGNOSIS — M25572 Pain in left ankle and joints of left foot: Secondary | ICD-10-CM

## 2016-11-29 DIAGNOSIS — S99912A Unspecified injury of left ankle, initial encounter: Secondary | ICD-10-CM | POA: Diagnosis not present

## 2016-11-29 MED ORDER — DOXYCYCLINE HYCLATE 100 MG PO TABS: 100.0000 mg | ORAL_TABLET | Freq: Two times a day (BID) | ORAL | 0 refills | Status: AC

## 2016-11-29 MED ORDER — OXYCODONE-ACETAMINOPHEN 10-325 MG PO TABS: 1.0000 | ORAL_TABLET | Freq: Three times a day (TID) | ORAL | 0 refills | Status: DC | PRN

## 2016-11-29 MED FILL — DOXYCYCLINE HYCLATE 100 MG: 100 | 7 days supply | Qty: 14 | Fill #0

## 2016-11-29 MED FILL — OXYCODONE/APAP 10/325 MG TA: 10-325 | 14 days supply | Qty: 40 | Fill #0

## 2016-11-29 NOTE — Progress Notes (Signed)
  Subjective:    CC: Toe injury  HPI: Nocholas is a pleasant 56 year old male, he fell down the stairs yesterday, laceration, simply put some packing on it and did not seek medical attention.  Unfortunately continued to be painful, bleeding, so he's here for further evaluation. Pain is severe, persistent, laceration is on the lateral aspect of the left second toe.  Past medical history:  Negative.  See flowsheet/record as well for more information.  Surgical history: Negative.  See flowsheet/record as well for more information.  Family history: Negative.  See flowsheet/record as well for more information.  Social history: Negative.  See flowsheet/record as well for more information.  Allergies, and medications have been entered into the medical record, reviewed, and no changes needed.   Review of Systems: No fevers, chills, night sweats, weight loss, chest pain, or shortness of breath.   Objective:    General: Well Developed, well nourished, and in no acute distress.  Neuro: Alert and oriented x3, extra-ocular muscles intact, sensation grossly intact.  HEENT: Normocephalic, atraumatic, pupils equal round reactive to light, neck supple, no masses, no lymphadenopathy, thyroid nonpalpable.  Skin: Warm and dry, no rashes. Cardiac: Regular rate and rhythm, no murmurs rubs or gallops, no lower extremity edema.  Respiratory: Clear to auscultation bilaterally. Not using accessory muscles, speaking in full sentences. Left foot: Laceration noted over the lateral aspect of the left second toe at the proximal interphalangeal joint, on adduction the toe it disarticulated and I was able to see the glistening cartilage of the distal end of the second proximal phalanx consistent with an open dislocation.  Procedure:  Dislocation reduction and laceration repair Risks, benefits, and alternatives explained and consent obtained. Time out conducted. Surface prepped with alcohol and chlorhexidine. 5cc lidocaine  with epinephrine infiltrated into the laceration edges. Adequate anesthesia ensured. I then irrigated the laceration and open dislocation with about 100 mL of sterile saline. I did not notice any gross contamination of the articular surface of the second proximal phalanx. I reduced the dislocation and placed #3, 4-0 Ethilon simple interrupted sutures to close the laceration Post reduction films obtained showed anatomic/near-anatomic alignment and no evidence of fracture. Pt stable, aftercare and follow-up advised.  Impression and Recommendations:    Open dislocation of left second PIP of the foot Irrigation performed, dislocation was reduced, I performed a cursory closure to reduce bleeding, tetanus shot given. Xrays show no fracture. Postop shoe. Discussed with Ortho trauma on call.   Will defer any surgical intervention and do doxycycline 100mg  PO BID and analgesics. He will see Dr. Erlinda Hong in office some time this week.  ___________________________________________ Gwen Her. Dianah Field, M.D., ABFM., CAQSM. Primary Care and [place] Instructor of [place] of [facility]

## 2016-11-29 NOTE — Assessment & Plan Note (Addendum)
Irrigation performed, dislocation was reduced, I performed a cursory closure to reduce bleeding, tetanus shot given. Xrays show no fracture. Postop shoe. Discussed with Ortho trauma on call.   Will defer any surgical intervention and do doxycycline 100mg  PO BID and analgesics. He will see Dr. Erlinda Hong in office some time this week.

## 2016-11-29 NOTE — Therapy (Addendum)
[facility] [address] [place], [place], [address] Phone: [phone]   Fax:  [phone]  Physical Therapy Evaluation  Patient Details  Name: Travis Huff MRN: [id number] Date of Birth: 03-Jan-1960 Referring Provider: Erline Levine MD   Encounter Date: 11/28/2016  PT End of Session - 11/28/16 1715    Visit Number  1    Number of Visits  17    Date for PT Re-Evaluation  01/27/17    Authorization Type  [facility]    PT Start Time  1534    PT Stop Time  1630    PT Time Calculation (min)  56 min    Activity Tolerance  Patient tolerated treatment well    Behavior During Therapy  [facility] for tasks assessed/performed       Past Medical History:  Diagnosis Date  . Arthritis   . Asthma   . GERD (gastroesophageal reflux disease)    uses tums  . Hypertension   . Viral pericarditis     Past Surgical History:  Procedure Laterality Date  . ABDOMINAL EXPOSURE N/A 06/23/2016   Procedure: ABDOMINAL EXPOSURE;  Surgeon: Rosetta Posner, MD;  Location: [facility];  Service: Vascular;  Laterality: N/A;  . ANTERIOR LATERAL LUMBAR FUSION 4 LEVELS Right 06/23/2016   Procedure: Right  Lumbar two-three Lumbar three-four Lumbar four-five Anterolateral lumbar interbody fusion;  Surgeon: Erline Levine, MD;  Location: [place];  Service: Neurosurgery;  Laterality: Right;  . ANTERIOR LUMBAR FUSION N/A 06/23/2016   Procedure: Lumbar five -sacral one  Anterior lumbar interbody fusion with Dr. Sherren Mocha Early for approach;  Surgeon: Erline Levine, MD;  Location: [facility];  Service: Neurosurgery;  Laterality: N/A;  . APPLICATION OF INTRAOPERATIVE CT SCAN N/A 06/26/2016   Procedure: APPLICATION OF INTRAOPERATIVE CT SCAN;  Surgeon: Erline Levine, MD;  Location: [facility];  Service: Neurosurgery;  Laterality: N/A;  . CARDIOVASCULAR STRESS TEST     Negative in May 2014  . COLONOSCOPY    . HERNIA REPAIR    . JOINT REPLACEMENT    . KNEE SURGERY    . POSTERIOR LUMBAR FUSION 4  LEVEL N/A 06/26/2016   Procedure: Thoracic ten to Pelvis fixation with AIRO;  Surgeon: Erline Levine, MD;  Location: [facility];  Service: Neurosurgery;  Laterality: N/A;  . TENDON REPAIR     right hand  . TOTAL HIP ARTHROPLASTY Right 05/25/2014   Procedure: RIGHT TOTAL HIP ARTHROPLASTY ANTERIOR APPROACH;  Surgeon: Rod Can, MD;  Location: [facility];  Service: Orthopedics;  Laterality: Right;  . TOTAL KNEE ARTHROPLASTY Bilateral     There were no vitals filed for this visit.   Subjective Assessment - 11/28/16 1540    Subjective  Came to PT because before I had back surgery they did an MRI and found what they called an "old stroke." For about 1 1/2 years if I stood my legs would go numb (due to my back) and I would lose my balance,  After back surgery, I still have balance problems. Still having neuropathy bil legs and not clear if he'll ever get return of sensation.     Pertinent History  anterior lumbar surgery with fusion T10 to pelvis 06/2016; post-op DVT and PE; rt posterior parietal infarct (undetermined age); OA; bil TKR; Rt THR; obesity    How long can you stand comfortably?  stood for 2 hours on Sunday (big improvement since back surgery)    Patient Stated Goals  Improve his balance.  Currently in Pain?  No/denies         11/29/16 0001  Assessment  Medical Diagnosis balance disorder  Referring Provider Erline Levine MD  Onset Date/Surgical Date ("for a few years")  Prior Therapy none for balance; post-joint replacements  Precautions  Precautions Back;Fall  Restrictions  Weight Bearing Restrictions No  Balance Screen  Has the patient fallen in the past 6 months Yes  How many times? 3 (pickig up from floor; legs "dropped out", legs just went)  Has the patient had a decrease in activity level because of a fear of falling?  No  Is the patient reluctant to leave their home because of a fear of falling?  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  Cognition  Overall Cognitive Status Within Functional Limits for tasks assessed  Sensation  Light Touch Impaired Detail  Light Touch Impaired Details Impaired RLE;Impaired LLE  Semmes Weinstein Monofilament Scale 4.56-6.65  Coordination  Gross Motor Movements are Fluid and Coordinated Yes  Fine Motor Movements are Fluid and Coordinated No (rapid toe taps quickly slowed and asynchronous)  Posture/Postural Control  Posture/Postural Control Postural limitations  Postural Limitations Rounded Shoulders;Forward head;Decreased lumbar lordosis  Posture Comments ?remaining scoliosis vs leg length discrepancy  ROM / Strength  AROM / PROM / Strength AROM;Strength  AROM  Overall AROM  Within functional limits for tasks performed  Strength  Overall Strength Within functional limits for tasks performed (bil LEs; no significant difference noted)  Transfers  Transfers Sit to Stand;Stand to Sit  Sit to Stand 4: Min guard;4: Min assist  Sit to Stand Details (indicate cue type and reason) posterior lean on multiple attempts  Five time sit to stand comments  15.9 (8.2 sec (norm for 56 yo); no UE use)  Stand to Sit 4: Min guard  Comments loss of control with chair scooting on several stand to sit transfers  Ambulation/Gait  Ambulation/Gait Yes  Ambulation/Gait Assistance 6: Modified independent (Device/Increase time)  Ambulation Distance (Feet) 80 Feet (80)  Assistive device None  Gait Pattern Step-through pattern;Decreased arm swing - right;Decreased arm  swing - left;Decreased stride length;Decreased stance time - left;Lateral trunk lean to right;Decreased trunk rotation;Wide base of support  Ambulation Surface Indoor  Gait velocity 32.8/10.44=3.14  Stairs Yes  Stairs Assistance 6: Modified independent (Device/Increase time)  Stair Management Technique Two rails;Alternating pattern;Forwards  Number of Stairs 4  Height of Stairs 6  Gait Comments Pt with lateral "drop-off" with  ?leg length discrepancy; pt reports he has felt like his RLE is shorter  Standardized Balance Assessment  Standardized Balance Assessment Dynamic Gait Index  Dynamic Gait Index  Level Surface 1  Change in Gait Speed 3  Gait with Horizontal Head Turns 0 (drifted >2 feet to left with left head turn; no drift w/ rt)  Gait with Vertical Head Turns 1  Gait and Pivot Turn 1  Step Over Obstacle 2  Step Around Obstacles 2  Steps 2  Total Score 12            Objective measurements completed on examination:  See above findings.              PT Education - 11/28/16 1714    Education provided  Yes    Education Details  results of evaluation compared to age-based norms; 3 sensory systems involved in balance and role PT has in addressing deficits    Person(s) Educated  Patient    Methods  Explanation    Comprehension  Verbalized understanding       PT Short Term Goals - 11/28/16 1727      PT SHORT TERM GOAL #1   Title  Patient will be independent with HEP to address balance deficits. (Target date for all STGs 12/28/16)    Time  4    Period  Weeks    Status  New    Target Date  12/28/16      PT SHORT TERM GOAL #2   Title  Patient will improve bil LE strength and balance as evidenced by decreased 5x sit to stand time to <12 seconds.    Time  4    Period  Weeks    Status  New      PT SHORT TERM GOAL #3   Title  Patient will improve DGI to >16 to demonstrate improved balance.    Time  4    Period  Weeks    Status  New      PT SHORT TERM  GOAL #4   Title  Patient will verbalize understanding of fall prevention education related to improving safety at home.    Time  4    Period  Weeks    Status  New        PT Long Term Goals - 11/28/16 1733      PT LONG TERM GOAL #1   Title  Patient will be independent with updated HEP addressing balance deficits (Target for all LTGs 01/28/16)    Target Date  01/27/17      PT LONG TERM GOAL #2   Title  Patient will improve 5x sit to stand to <10 sec as indication of improved LE strength and balance.    Time  4    Period  Weeks    Status  New      PT LONG TERM GOAL #3   Title  Patient will improve DGI              Plan - 11/28/16 1716    Clinical Impression Statement  Patient presents with multi-year history of imbalance (including 3 falls in the past 6 months) that for many years was solely attributed to neurogenic claudication (per pt). Back and legs feel much better after back surgery June 2018, however numbness in bilateral legs remains and balance has not improved. Anticipate patient can benefit from PT to address the deficits listed below via the interventions listed below. Patient is motivated to improve and agrees to utilize HEP when issued.     History and Personal Factors relevant to plan of care:  PMH-severe OA with bil TKR and Rt THR; T10-pelvis fusion 06/2016; prior Rt posterior parietal CVA   Personal factors-mechanism of injury (neuropathy bil LEs)    Clinical Presentation  Evolving    Clinical Presentation due to:  Pt reports some improvement in numbness in bil LEs since 06/2016 back surgery and may continue to have nerve regeneration/improvements    Clinical Decision Making  Moderate    Rehab Potential  Good    Clinical Impairments Affecting Rehab Potential  obesity, neuropathy, ?vestibular deficit    PT Frequency  2x / week    PT Duration  8 weeks    PT Treatment/Interventions  ADLs/Self Care Home Management;Gait training;DME Instruction;Stair  training;Functional mobility training;Therapeutic activities;Therapeutic exercise;Balance training;Neuromuscular re-education;Patient/family education;Passive range of motion;Vestibular;Visual/perceptual remediation/compensation    PT Next Visit Plan  screen for vestibular deficit (has h/o 2 severe bouts of vertigo--?BPPV per description) ? underlying hypofunction; check ankle proprioception; measure leg lengths; initiate balance HEP    Consulted and Agree with Plan of Care  Patient       Patient will benefit from skilled therapeutic intervention in order to improve the following deficits and impairments:  Abnormal gait, Decreased balance, Decreased mobility, Decreased knowledge of use of DME, Decreased coordination, Impaired flexibility, Impaired sensation, Postural dysfunction, Obesity  Visit Diagnosis: No diagnosis found.     Problem List Patient Active Problem List   Diagnosis Date Noted  . Open dislocation of left second PIP of the foot 11/29/2016  . Scoliosis of thoracolumbar spine 06/23/2016  . Headache 06/20/2016  . Anxiety, generalized 05/11/2016  . Primary osteoarthritis of right hip 05/25/2014  . Benign essential hypertension 03/26/2014  . Spinal stenosis, lumbar region, with neurogenic claudication 12/21/2013  . Osteoarthritis of right hip 07/07/2013  . Morbid obesity ([place]) 08/01/2012  . Precordial pain 05/24/2012  . S/P total knee arthroplasty 01/26/2012  . Erectile dysfunction 01/26/2012  . Preventive measure 01/26/2012    Galvin Proffer 11/29/2016, 3:35 PM  [facility] [address] [place], [place], [address] Phone: [phone]   Fax:  [phone]  Name: TEDFORD BERG MRN: [id number] Date of Birth: 1960-11-06

## 2016-11-30 ENCOUNTER — Encounter (INDEPENDENT_AMBULATORY_CARE_PROVIDER_SITE_OTHER): Payer: Self-pay | Admitting: Orthopaedic Surgery

## 2016-11-30 ENCOUNTER — Ambulatory Visit (INDEPENDENT_AMBULATORY_CARE_PROVIDER_SITE_OTHER): Payer: 59 | Admitting: Orthopaedic Surgery

## 2016-11-30 DIAGNOSIS — S92902B Unspecified fracture of left foot, initial encounter for open fracture: Secondary | ICD-10-CM | POA: Diagnosis not present

## 2016-11-30 DIAGNOSIS — S92425A Nondisplaced fracture of distal phalanx of left great toe, initial encounter for closed fracture: Secondary | ICD-10-CM

## 2016-11-30 NOTE — Addendum Note (Signed)
Addended by: Elizabeth Sauer on: 11/30/2016 08:28 AM   Modules accepted: Orders

## 2016-11-30 NOTE — Progress Notes (Signed)
Office Visit Note   Patient: Travis Huff           Date of Birth: 13-Sep-1960           MRN: [id number] Visit Date: 11/30/2016              Requested by: Silverio Decamp, [address] [place], [place] [address] PCP: Silverio Decamp, MD   Assessment & Plan: Visit Diagnoses:  1. Open dislocation of left second PIP of the foot   2. Nondisplaced fracture of distal phalanx of left great toe, initial encounter for closed fracture     Plan: From my standpoint we can treat the fracture of the dislocations conservatively.  This was thoroughly irrigated in the office.  He is on antibiotics.  There is no gross contamination.  Weight-bear as tolerated in a postop shoe.  Recheck the wound in 2 weeks.  Buddy taping as needed.  Bactroban ointment was provided today  Follow-Up Instructions: Return in about 2 weeks (around 12/14/2016).   Orders:  No orders of the defined types were placed in this encounter.  No orders of the defined types were placed in this encounter.     Procedures: No procedures performed   Clinical Data: No additional findings.   Subjective: Chief Complaint  Patient presents with  . Left Foot - Pain    Patient is a 56 year old gentleman comes in with left great toe fracture and open dislocation of his second toe PIP joint.  He had a fall 2 days ago and went to his primary care doctor the following day due to the laceration.  This was thoroughly irrigated and loosely approximated.  There is no gross examination.  He is currently taking doxycycline.  He does have swelling and bruising no numbness and tingling.    Review of Systems  Constitutional: Negative.   All other systems reviewed and are negative.    Objective: Vital Signs: There were no vitals taken for this visit.  Physical Exam  Constitutional: He is oriented to person, place, and time. He appears well-developed and well-nourished.  HENT:  Head: Normocephalic and  atraumatic.  Eyes: Pupils are equal, round, and reactive to light.  Neck: Neck supple.  Pulmonary/Chest: Effort normal.  Abdominal: Soft.  Musculoskeletal: Normal range of motion.  Neurological: He is alert and oriented to person, place, and time.  Skin: Skin is warm.  Psychiatric: He has a normal mood and affect. His behavior is normal. Judgment and thought content normal.  Nursing note and vitals reviewed.   Ortho Exam Left foot exam shows moderate swelling and bruising.  The traumatic laceration is hemostatic.  His sutures are intact.  The toes are warm and well perfused without neurovascular compromise. Specialty Comments:  No specialty comments available.  Imaging: Dg Ankle Complete Left  Result Date: 11/29/2016 CLINICAL DATA:  Status post fall down the steps. EXAM: LEFT ANKLE COMPLETE - 3+ VIEW COMPARISON:  None. FINDINGS: There is no evidence of fracture or joint effusion. There is apparent widening of the medial portion of the ankle mortise, which may be projectional. Soft tissues are unremarkable. IMPRESSION: No evidence of fracture. Apparent widening of the medial portion of the ankle mortise, which may be projectional. Please correlate to the site of point tenderness. Electronically Signed   By: Fidela Salisbury M.D.   On: 11/29/2016 12:50   Dg Foot Complete Left  Result Date: 11/29/2016 CLINICAL DATA:  Fall. Evaluate for fracture EXAM: LEFT FOOT -  COMPLETE 3+ VIEW COMPARISON:  None. FINDINGS: There is a lucency through the lateral base and extending into the IP joint of the first distal phalanx. Valgus deformity identified. There is of Here is no evidence of arthropathy or other focal bone abnormality. Soft tissues are unremarkable. IMPRESSION: 1. Nondisplaced fracture is suspected involving the base of the first distal phalanx. Electronically Signed   By: Kerby Moors M.D.   On: 11/29/2016 12:48     PMFS History: Patient Active Problem List   Diagnosis Date Noted  .  Nondisplaced fracture of distal phalanx of left great toe, initial encounter for closed fracture 11/30/2016  . Open dislocation of left second PIP of the foot 11/29/2016  . Scoliosis of thoracolumbar spine 06/23/2016  . Headache 06/20/2016  . Anxiety, generalized 05/11/2016  . Primary osteoarthritis of right hip 05/25/2014  . Benign essential hypertension 03/26/2014  . Spinal stenosis, lumbar region, with neurogenic claudication 12/21/2013  . Osteoarthritis of right hip 07/07/2013  . Morbid obesity ([place]) 08/01/2012  . Precordial pain 05/24/2012  . S/P total knee arthroplasty 01/26/2012  . Erectile dysfunction 01/26/2012  . Preventive measure 01/26/2012   Past Medical History:  Diagnosis Date  . Arthritis   . Asthma   . GERD (gastroesophageal reflux disease)    uses tums  . Hypertension   . Viral pericarditis     Family History  Adopted: Yes    Past Surgical History:  Procedure Laterality Date  . ABDOMINAL EXPOSURE N/A 06/23/2016   Procedure: ABDOMINAL EXPOSURE;  Surgeon: Rosetta Posner, MD;  Location: [facility];  Service: Vascular;  Laterality: N/A;  . ANTERIOR LATERAL LUMBAR FUSION 4 LEVELS Right 06/23/2016   Procedure: Right  Lumbar two-three Lumbar three-four Lumbar four-five Anterolateral lumbar interbody fusion;  Surgeon: Erline Levine, MD;  Location: [facility];  Service: Neurosurgery;  Laterality: Right;  . ANTERIOR LUMBAR FUSION N/A 06/23/2016   Procedure: Lumbar five -sacral one  Anterior lumbar interbody fusion with Dr. Sherren Mocha Early for approach;  Surgeon: Erline Levine, MD;  Location: [facility];  Service: Neurosurgery;  Laterality: N/A;  . APPLICATION OF INTRAOPERATIVE CT SCAN N/A 06/26/2016   Procedure: APPLICATION OF INTRAOPERATIVE CT SCAN;  Surgeon: Erline Levine, MD;  Location: [facility];  Service: Neurosurgery;  Laterality: N/A;  . CARDIOVASCULAR STRESS TEST     Negative in May 2014  . COLONOSCOPY    . HERNIA REPAIR    . JOINT REPLACEMENT    . KNEE SURGERY    . POSTERIOR LUMBAR  FUSION 4 LEVEL N/A 06/26/2016   Procedure: Thoracic ten to Pelvis fixation with AIRO;  Surgeon: Erline Levine, MD;  Location: [facility];  Service: Neurosurgery;  Laterality: N/A;  . TENDON REPAIR     right hand  . TOTAL HIP ARTHROPLASTY Right 05/25/2014   Procedure: RIGHT TOTAL HIP ARTHROPLASTY ANTERIOR APPROACH;  Surgeon: Rod Can, MD;  Location: [facility];  Service: Orthopedics;  Laterality: Right;  . TOTAL KNEE ARTHROPLASTY Bilateral    Social History   Occupational History  . Not on file  Tobacco Use  . Smoking status: Former Smoker    Packs/day: 1.00    Years: 25.00    Pack years: 25.00    Types: Cigarettes    Last attempt to quit: 2000    Years since quitting: 18.9  . Smokeless tobacco: Current User    Types: Snuff  Substance and Sexual Activity  . Alcohol use: Yes    Alcohol/week: 8.4 oz    Types: 14 Cans of beer  per week    Comment: 2 beers QD  . Drug use: No  . Sexual activity: Not on file

## 2016-12-04 ENCOUNTER — Telehealth (INDEPENDENT_AMBULATORY_CARE_PROVIDER_SITE_OTHER): Payer: Self-pay | Admitting: Orthopaedic Surgery

## 2016-12-04 NOTE — Telephone Encounter (Signed)
Ok to do

## 2016-12-04 NOTE — Telephone Encounter (Signed)
He can be released but he needs to wear the postop shoe at all times when working

## 2016-12-04 NOTE — Telephone Encounter (Signed)
Patient called advised his toe feel fine. Patient asked if he can be released to go back to work full time. Patient asked if the note can be faxed to Toni Amend   Fax# [phone]  The number to contact patient is [phone]

## 2016-12-05 NOTE — Telephone Encounter (Signed)
IC pt and advised ok, but must wear postop shoe, note done and faxed as requested

## 2016-12-07 ENCOUNTER — Encounter: Payer: 59 | Admitting: Physical Therapy

## 2016-12-08 ENCOUNTER — Encounter: Payer: 59 | Admitting: Physical Therapy

## 2016-12-12 ENCOUNTER — Encounter (INDEPENDENT_AMBULATORY_CARE_PROVIDER_SITE_OTHER): Payer: Self-pay | Admitting: Orthopaedic Surgery

## 2016-12-12 ENCOUNTER — Ambulatory Visit (INDEPENDENT_AMBULATORY_CARE_PROVIDER_SITE_OTHER): Payer: 59 | Admitting: Orthopaedic Surgery

## 2016-12-12 DIAGNOSIS — S92902B Unspecified fracture of left foot, initial encounter for open fracture: Secondary | ICD-10-CM

## 2016-12-12 NOTE — Progress Notes (Signed)
Patient follows up today for a wound check.  He is doing well.  He is wearing normal shoes.  Denies any pain.  His traumatic wound has essentially completely healed.  There is no drainage or signs of infection.  The sutures were removed.  Continue with Bactroban ointment until this is fully healed.  Patient was instructed on worrisome signs of infection.  He knows to let us know if there is any concerns or questions.  Follow-up as needed otherwise.

## 2016-12-14 MED FILL — GABAPENTIN 300 MG CAPSULE: 300 | 30 days supply | Qty: 90 | Fill #3

## 2016-12-14 MED FILL — CYCLOBENZAPRINE 10 MG TAB: 10 | 30 days supply | Qty: 90 | Fill #2

## 2016-12-15 ENCOUNTER — Encounter: Payer: 59 | Admitting: Physical Therapy

## 2016-12-18 ENCOUNTER — Ambulatory Visit: Payer: 59 | Attending: Neurosurgery | Admitting: Physical Therapy

## 2016-12-18 ENCOUNTER — Encounter: Payer: Self-pay | Admitting: Physical Therapy

## 2016-12-18 DIAGNOSIS — R2689 Other abnormalities of gait and mobility: Secondary | ICD-10-CM | POA: Insufficient documentation

## 2016-12-18 DIAGNOSIS — R2681 Unsteadiness on feet: Secondary | ICD-10-CM | POA: Insufficient documentation

## 2016-12-18 DIAGNOSIS — R29898 Other symptoms and signs involving the musculoskeletal system: Secondary | ICD-10-CM | POA: Insufficient documentation

## 2016-12-18 DIAGNOSIS — R42 Dizziness and giddiness: Secondary | ICD-10-CM | POA: Insufficient documentation

## 2016-12-18 NOTE — Therapy (Signed)
[facility] [address] [place], [place], [address] Phone: [phone]   Fax:  [phone]  Physical Therapy Treatment  Patient Details  Name: Travis Huff MRN: [id number] Date of Birth: [birth year] Referring Provider: Erline Levine MD   Encounter Date: 12/18/2016  PT End of Session - 12/18/16 2100    Visit Number  2    Number of Visits  17    Date for PT Re-Evaluation  01/27/17    Authorization Type  [facility]    PT Start Time  1533    PT Stop Time  1620    PT Time Calculation (min)  47 min    Activity Tolerance  Patient tolerated treatment well    Behavior During Therapy  [facility] for tasks assessed/performed       Past Medical History:  Diagnosis Date  . Arthritis   . Asthma   . GERD (gastroesophageal reflux disease)    uses tums  . Hypertension   . Viral pericarditis     Past Surgical History:  Procedure Laterality Date  . ABDOMINAL EXPOSURE N/A 06/23/2016   Procedure: ABDOMINAL EXPOSURE;  Surgeon: Rosetta Posner, MD;  Location: [facility];  Service: Vascular;  Laterality: N/A;  . ANTERIOR LATERAL LUMBAR FUSION 4 LEVELS Right 06/23/2016   Procedure: Right  Lumbar two-three Lumbar three-four Lumbar four-five Anterolateral lumbar interbody fusion;  Surgeon: Erline Levine, MD;  Location: [facility];  Service: Neurosurgery;  Laterality: Right;  . ANTERIOR LUMBAR FUSION N/A 06/23/2016   Procedure: Lumbar five -sacral one  Anterior lumbar interbody fusion with Dr. Sherren Mocha Early for approach;  Surgeon: Erline Levine, MD;  Location: [facility];  Service: Neurosurgery;  Laterality: N/A;  . APPLICATION OF INTRAOPERATIVE CT SCAN N/A 06/26/2016   Procedure: APPLICATION OF INTRAOPERATIVE CT SCAN;  Surgeon: Erline Levine, MD;  Location: [facility];  Service: Neurosurgery;  Laterality: N/A;  . CARDIOVASCULAR STRESS TEST     Negative in May 2014  . COLONOSCOPY    . HERNIA REPAIR    . JOINT REPLACEMENT    . KNEE SURGERY    . POSTERIOR LUMBAR FUSION 4  LEVEL N/A 06/26/2016   Procedure: Thoracic ten to Pelvis fixation with AIRO;  Surgeon: Erline Levine, MD;  Location: [facility];  Service: Neurosurgery;  Laterality: N/A;  . TENDON REPAIR     right hand  . TOTAL HIP ARTHROPLASTY Right 05/25/2014   Procedure: RIGHT TOTAL HIP ARTHROPLASTY ANTERIOR APPROACH;  Surgeon: Rod Can, MD;  Location: [facility];  Service: Orthopedics;  Laterality: Right;  . TOTAL KNEE ARTHROPLASTY Bilateral     There were no vitals filed for this visit.  Subjective Assessment - 12/18/16 1536    Subjective  I slipped going down the steps into the garage. Split the skin open between left 1st and 2nd toe that required stitches and hairline fracture of 1st toe. (Caused pt to cancel recent appointments). I tend to only take my cane when I go out to a store.     Pertinent History  anterior lumbar surgery with fusion T10 to pelvis 06/2016; post-op DVT and PE; rt posterior parietal infarct (undetermined age); OA; bil TKR; Rt THR; obesity    How long can you stand comfortably?  stood for 2 hours on Sunday (big improvement since back surgery)    Patient Stated Goals  Improve his balance.     Currently in Pain?  No/denies         [facility] PT Assessment - 12/18/16 2046  Sensation   Proprioception  Impaired Detail    Proprioception Impaired Details  Impaired RLE;Impaired LLE    Additional Comments  only able to detect extreme endranges of ankle PF and DF one each leg      Special Tests    Special Tests  Leg LengthTest    Leg length test   True      True   Length  Greater trochanter to lateral malleolus; LLE 36 5/8 inch, RLE 36 6/8 inch    Comments  trialed walking with 1/4" heel lift in left shoe. No appreciable difference. Pt reports he still has heel lift from previouswork iwth MD and will try to find                  [facility] Adult PT Treatment/Exercise - 12/18/16 2046      Transfers   Transfers  Sit to Stand;Stand to Sit    Sit to Stand  5: Supervision;4: Min  guard    Sit to Stand Details (indicate cue type and reason)  posterior lean upon standing; close guard for safety    Stand to Sit  6: Modified independent (Device/Increase time)      Ambulation/Gait   Ambulation/Gait  Yes    Ambulation/Gait Assistance  5: Supervision;4: Min guard    Ambulation/Gait Assistance Details  with head turns and wihout cane required minguard    Ambulation Distance (Feet)  300 Feet x 3    Assistive device  Straight cane;None    Gait Pattern  Step-through pattern;Decreased arm swing - right;Decreased arm swing - left;Decreased stride length;Decreased stance time - left;Decreased trunk rotation;Wide base of support;Decreased step length - right;Decreased weight shift to right;Lateral trunk lean to left    Ambulation Surface  Indoor    Ramp  5: Supervision    Ramp Details (indicate cue type and reason)  could technique with canej    Curb  4: Min assist    Curb Details (indicate cue type and reason)  instructional cues for use of cane with numbness bil feet      Posture/Postural Control   Posture Comments  scoliosis of spine             PT Education - 12/18/16 2055    Education provided  Yes    Education Details  nature of polyneuropathy; rationale for use of cane    Person(s) Educated  Patient    Methods  Explanation;Demonstration    Comprehension  Verbalized understanding;Returned demonstration;Need further instruction       PT Short Term Goals - 12/18/16 2107      PT SHORT TERM GOAL #1   Title  Patient will be independent with HEP to address balance deficits. (Target date for all STGs 12/28/16; date extended to 01/04/17)    Time  4    Period  Weeks    Status  New      PT SHORT TERM GOAL #2   Title  Patient will improve bil LE strength and balance as evidenced by decreased 5x sit to stand time to <12 seconds.    Baseline  11/28/16  15.9 sec    Time  4    Period  Weeks    Status  New      PT SHORT TERM GOAL #3   Title  Patient will improve  DGI to >16 to demonstrate improved balance.    Baseline  11/28/16  12/24    Time  4    Period  [place]  Status  New      PT SHORT TERM GOAL #4   Title  Patient will verbalize understanding of fall prevention education related to improving safety at home.    Time  4    Period  Weeks    Status  New        PT Long Term Goals - 11/28/16 1733      PT LONG TERM GOAL #1   Title  Patient will be independent with updated HEP addressing balance deficits (Target for all LTGs 01/28/16)    Time  8    Period  Weeks    Status  New    Target Date  01/27/17      PT LONG TERM GOAL #2   Title  Patient will improve 5x sit to stand to <10 sec as indication of improved LE strength and balance.    Time  8    Period  Weeks    Status  New      PT LONG TERM GOAL #3   Title  Patient will improve DGI score to >=19/24 to indicate progress towards lesser fall risk.    Time  8    Period  Weeks    Status  New      PT LONG TERM GOAL #4   Title  Patient will verbalize a plan for community-based activity upon discharge from PT.     Time  8    Period  Weeks    Status  New            Plan - 12/18/16 2101    Clinical Impression Statement  Patient returns after several missed appointments due to fall and injuries. He is very open and receptive to feedback and education. By the end of the session, he was convinced that he does indeed need to use his cane. Discussed his desire to get outside on unlevel surfaces and agreed will work with the walking stick(s) here to see if these would benefit him.     Rehab Potential  Good    Clinical Impairments Affecting Rehab Potential  obesity, neuropathy, ?vestibular deficit    PT Frequency  2x / week    PT Duration  8 weeks    PT Treatment/Interventions  ADLs/Self Care Home Management;Gait training;DME Instruction;Stair training;Functional mobility training;Therapeutic activities;Therapeutic exercise;Balance training;Neuromuscular re-education;Patient/family  education;Passive range of motion;Vestibular;Visual/perceptual remediation/compensation    PT Next Visit Plan  try walking stick(s) for pt to use outdoors; review HEP given 12/17 (only had time to do a few reps of each); check need for heel cord stretch; balance activities    Consulted and Agree with Plan of Care  Patient       Patient will benefit from skilled therapeutic intervention in order to improve the following deficits and impairments:  Abnormal gait, Decreased balance, Decreased mobility, Decreased knowledge of use of DME, Decreased coordination, Impaired flexibility, Impaired sensation, Postural dysfunction, Obesity  Visit Diagnosis: Other abnormalities of gait and mobility  Other symptoms and signs involving the musculoskeletal system  Unsteadiness on feet     Problem List Patient Active Problem List   Diagnosis Date Noted  . Nondisplaced fracture of distal phalanx of left great toe, initial encounter for closed fracture 11/30/2016  . Open dislocation of left second PIP of the foot 11/29/2016  . Scoliosis of thoracolumbar spine 06/23/2016  . Headache 06/20/2016  . Anxiety, generalized 05/11/2016  . Primary osteoarthritis of right hip 05/25/2014  . Benign essential hypertension 03/26/2014  . Spinal  stenosis, lumbar region, with neurogenic claudication 12/21/2013  . Osteoarthritis of right hip 07/07/2013  . Morbid obesity ([place]) 08/01/2012  . Precordial pain 05/24/2012  . S/P total knee arthroplasty 01/26/2012  . Erectile dysfunction 01/26/2012  . Preventive measure 01/26/2012    Rexanne Mano, PT 12/18/2016, 9:10 PM  [facility] [address] [place], [place], [address] Phone: [phone]   Fax:  [phone]  Name: Travis Huff MRN: [id number] Date of Birth: Jul 08, 1960

## 2016-12-18 NOTE — Patient Instructions (Signed)
Hip Side Kick    Holding a chair for balance, keep legs shoulder width apart and toes pointed forward. Lift a leg out to side, keeping knee straight. Do not lean. Repeat using other leg. Repeat __20__ times. Do __1__ sessions per day.  http://gt2.exer.us/342   Copyright  VHI. All rights reserved.     Knee High    Holding stable object, raise knee to hip level, then lower knee. Repeat with other knee. Repeat 20 times alternating legs. Do 1 sessions per day.    http://gt2.exer.us/767   Copyright  VHI. All rights reserved.                       Ankle Plantar Flexion / Dorsiflexion, Standing    Stand while holding a stable object. Rise up on toes. Then rock back on heels. Hold each position 2 seconds. Repeat 20 times per session alternating legs. Do 1 sessions per day.  Copyright  VHI. All rights reserved.    Functional Quadriceps: Sit to Stand    Sit on edge of chair, feet flat on floor. Stand upright, extending knees fully. Repeat __10__ times per set. Do ___1_ sets per session. Do __1__ sessions per day.  http://orth.exer.us/734   Copyright  VHI. All rights reserved.  Sit to Stand Transfers:  1. Scoot out to the edge of the chair 2. Place your feet flat on the floor, shoulder width apart.  Make sure your feet are tucked just under your knees. 3. Lean forward (nose over toes) with momentum, and stand up tall with your best posture.  If you need to use your arms, use them as a quick boost up to stand. 4. If you are in a low or soft chair, you can lean back and then forward up to stand, in order to get more momentum. 5. Once you are standing, make sure you are looking ahead and standing tall.  To sit down:  1. Back up until you feel the chair behind your legs. 2. Bend at you hips, reaching  Back for you chair, if needed, then slowly squat to sit down on your chair.

## 2016-12-21 ENCOUNTER — Ambulatory Visit: Payer: 59 | Admitting: Physical Therapy

## 2016-12-21 ENCOUNTER — Encounter: Payer: Self-pay | Admitting: Physical Therapy

## 2016-12-21 DIAGNOSIS — R29898 Other symptoms and signs involving the musculoskeletal system: Secondary | ICD-10-CM

## 2016-12-21 DIAGNOSIS — R2689 Other abnormalities of gait and mobility: Secondary | ICD-10-CM

## 2016-12-21 DIAGNOSIS — R2681 Unsteadiness on feet: Secondary | ICD-10-CM | POA: Diagnosis not present

## 2016-12-21 DIAGNOSIS — R42 Dizziness and giddiness: Secondary | ICD-10-CM | POA: Diagnosis not present

## 2016-12-21 NOTE — Patient Instructions (Addendum)
Perform these in a corner with a chair in front of you for safety:  Feet Apart (Compliant Surface) Varied Arm Positions - Eyes Closed    Stand on compliant surface: _pillow/s_ with feet shoulder width apart and arms out. Close eyes and visualize upright position. Hold _30_ seconds. Repeat _3_ times per session. Do _1-2_ sessions per day.  Copyright  VHI. All rights reserved.    Feet Apart (Compliant Surface) Head Motion - Eyes Closed    Stand on compliant surface: _pillow/s_ with feet shoulder width apart. Close eyes and move head slowly: 1. Up and down x 10 2. Left and right x 10 3. Up right/down left x 10 4. Up left/down right x 10    Do _1-2_ sessions per day.  Copyright  VHI. All rights reserved.

## 2016-12-22 NOTE — Therapy (Signed)
[facility] [address] [place], [place], [address] Phone: [phone]   Fax:  [phone]  Physical Therapy Treatment  Patient Details  Name: JOHNATAN BASKETTE MRN: [id number] Date of Birth: 11/14/1960 Referring Provider: Erline Levine MD   Encounter Date: 12/21/2016  PT End of Session - 12/21/16 1622    Visit Number  3    Number of Visits  17    Date for PT Re-Evaluation  01/27/17    Authorization Type  UMR    PT Start Time  1620    PT Stop Time  1659    PT Time Calculation (min)  39 min    Equipment Utilized During Treatment  Gait belt    Activity Tolerance  Patient tolerated treatment well    Behavior During Therapy  [facility] for tasks assessed/performed       Past Medical History:  Diagnosis Date  . Arthritis   . Asthma   . GERD (gastroesophageal reflux disease)    uses tums  . Hypertension   . Viral pericarditis     Past Surgical History:  Procedure Laterality Date  . ABDOMINAL EXPOSURE N/A 06/23/2016   Procedure: ABDOMINAL EXPOSURE;  Surgeon: Rosetta Posner, MD;  Location: [facility];  Service: Vascular;  Laterality: N/A;  . ANTERIOR LATERAL LUMBAR FUSION 4 LEVELS Right 06/23/2016   Procedure: Right  Lumbar two-three Lumbar three-four Lumbar four-five Anterolateral lumbar interbody fusion;  Surgeon: Erline Levine, MD;  Location: [facility];  Service: Neurosurgery;  Laterality: Right;  . ANTERIOR LUMBAR FUSION N/A 06/23/2016   Procedure: Lumbar five -sacral one  Anterior lumbar interbody fusion with Dr. Sherren Mocha Early for approach;  Surgeon: Erline Levine, MD;  Location: [facility];  Service: Neurosurgery;  Laterality: N/A;  . APPLICATION OF INTRAOPERATIVE CT SCAN N/A 06/26/2016   Procedure: APPLICATION OF INTRAOPERATIVE CT SCAN;  Surgeon: Erline Levine, MD;  Location: [facility];  Service: Neurosurgery;  Laterality: N/A;  . CARDIOVASCULAR STRESS TEST     Negative in May 2014  . COLONOSCOPY    . HERNIA REPAIR    . JOINT REPLACEMENT     . KNEE SURGERY    . POSTERIOR LUMBAR FUSION 4 LEVEL N/A 06/26/2016   Procedure: Thoracic ten to Pelvis fixation with AIRO;  Surgeon: Erline Levine, MD;  Location: [facility];  Service: Neurosurgery;  Laterality: N/A;  . TENDON REPAIR     right hand  . TOTAL HIP ARTHROPLASTY Right 05/25/2014   Procedure: RIGHT TOTAL HIP ARTHROPLASTY ANTERIOR APPROACH;  Surgeon: Rod Can, MD;  Location: [facility];  Service: Orthopedics;  Laterality: Right;  . TOTAL KNEE ARTHROPLASTY Bilateral     There were no vitals filed for this visit.  Subjective Assessment - 12/21/16 1622    Subjective  No new complaints. No falls or pain to report. States HEP is going well.     Pertinent History  anterior lumbar surgery with fusion T10 to pelvis 06/2016; post-op DVT and PE; rt posterior parietal infarct (undetermined age); OA; bil TKR; Rt THR; obesity    How long can you stand comfortably?  stood for 2 hours on Sunday (big improvement since back surgery)    Patient Stated Goals  Improve his balance.     Currently in Pain?  No/denies          [facility] Adult PT Treatment/Exercise - 12/21/16 1629      Transfers   Transfers  Sit to Stand;Stand to Sit    Sit to Stand  5:  Supervision;Without upper extremity assist    Stand to Sit  6: Modified independent (Device/Increase time);Without upper extremity assist      Ambulation/Gait   Ambulation/Gait  Yes    Ambulation/Gait Assistance  5: Supervision    Ambulation/Gait Assistance Details  used cane into/out of gym from/to lobby; instructed in use of walking stick for 250 feet with cues on sequening and posture. no AD used for gait between activities    Assistive device  Straight cane;None;Other (Comment) walking stick     Gait Pattern  Step-through pattern;Decreased arm swing - right;Decreased arm swing - left;Decreased stride length;Decreased stance time - left;Decreased trunk rotation;Wide base of support;Decreased step length - right;Decreased weight shift to right;Lateral  trunk lean to left    Ambulation Surface  Level;Indoor    Ramp  5: Supervision    Ramp Details (indicate cue type and reason)  x1 with waking stick with cues on sequencing, x2 on blue mat over ramp with min guard assist      Neuro Re-ed    Neuro Re-ed Details   standing across blue foam beam: alternating fwd stepping to floor and back to beam, then alternating bwd stepping to floor and back onto bean. light UE support.      Exercises   Other Exercises   reviewed HEP issued at last session with pt performing 10 reps each with min cues on posture/ex form.       Issued the following to pt's HEP today after pt performance in session with min guard assist and cues on posture/weight shifting to assist balance recovery.   Perform these in a corner with a chair in front of you for safety:  Feet Apart (Compliant Surface) Varied Arm Positions - Eyes Closed    Stand on compliant surface: _pillow/s_ with feet shoulder width apart and arms out. Close eyes and visualize upright position. Hold _30_ seconds. Repeat _3_ times per session. Do _1-2_ sessions per day.  Copyright  VHI. All rights reserved.    Feet Apart (Compliant Surface) Head Motion - Eyes Closed    Stand on compliant surface: _pillow/s_ with feet shoulder width apart. Close eyes and move head slowly: 1. Up and down x 10 2. Left and right x 10 3. Up right/down left x 10 4. Up left/down right x 10    Do _1-2_ sessions per day.   Copyright  VHI. All rights reserved.          PT Short Term Goals - 12/18/16 2107      PT SHORT TERM GOAL #1   Title  Patient will be independent with HEP to address balance deficits. (Target date for all STGs 12/28/16; date extended to 01/04/17)    Time  4    Period  Weeks    Status  New      PT SHORT TERM GOAL #2   Title  Patient will improve bil LE strength and balance as evidenced by decreased 5x sit to stand time to <12 seconds.    Baseline  11/28/16  15.9 sec    Time  4    Period   Weeks    Status  New      PT SHORT TERM GOAL #3   Title  Patient will improve DGI to >16 to demonstrate improved balance.    Baseline  11/28/16  12/24    Time  4    Period  Weeks    Status  New      PT SHORT TERM  GOAL #4   Title  Patient will verbalize understanding of fall prevention education related to improving safety at home.    Time  4    Period  Weeks    Status  New        PT Long Term Goals - 11/28/16 1733      PT LONG TERM GOAL #1   Title  Patient will be independent with updated HEP addressing balance deficits (Target for all LTGs 01/28/16)    Time  8    Period  Weeks    Status  New    Target Date  01/27/17      PT LONG TERM GOAL #2   Title  Patient will improve 5x sit to stand to <10 sec as indication of improved LE strength and balance.    Time  8    Period  Weeks    Status  New      PT LONG TERM GOAL #3   Title  Patient will improve DGI score to >=19/24 to indicate progress towards lesser fall risk.    Time  8    Period  Weeks    Status  New      PT LONG TERM GOAL #4   Title  Patient will verbalize a plan for community-based activity upon discharge from PT.     Time  8    Period  Weeks    Status  New            Plan - 12/21/16 1623    Clinical Impression Statement  Today's skilled session reviewed HEP issued at last session and added on balance components today. Also introduced use of walking stick for gait. Used indoors, however to fully demo it's potential for assitance with outdoor use due to rainy weather. WIll need to take it outdoors with pt on uneven terrain (as that is where the pt will mostly likely use it) when weather permits. Remainder of session focused on balance activities with pt being challenged by complaint surfaces. Pt is progressing toward goals and should benefit from continued PT to progress toward unmet goals.     Rehab Potential  Good    Clinical Impairments Affecting Rehab Potential  obesity, neuropathy, ?vestibular  deficit    PT Frequency  2x / week    PT Duration  8 weeks    PT Treatment/Interventions  ADLs/Self Care Home Management;Gait training;DME Instruction;Stair training;Functional mobility training;Therapeutic activities;Therapeutic exercise;Balance training;Neuromuscular re-education;Patient/family education;Passive range of motion;Vestibular;Visual/perceptual remediation/compensation    PT Next Visit Plan  try walking stick(s) outdoors as weather permits; balance activities    Consulted and Agree with Plan of Care  Patient       Patient will benefit from skilled therapeutic intervention in order to improve the following deficits and impairments:  Abnormal gait, Decreased balance, Decreased mobility, Decreased knowledge of use of DME, Decreased coordination, Impaired flexibility, Impaired sensation, Postural dysfunction, Obesity  Visit Diagnosis: Other abnormalities of gait and mobility  Unsteadiness on feet  Other symptoms and signs involving the musculoskeletal system     Problem List Patient Active Problem List   Diagnosis Date Noted  . Nondisplaced fracture of distal phalanx of left great toe, initial encounter for closed fracture 11/30/2016  . Open dislocation of left second PIP of the foot 11/29/2016  . Scoliosis of thoracolumbar spine 06/23/2016  . Headache 06/20/2016  . Anxiety, generalized 05/11/2016  . Primary osteoarthritis of right hip 05/25/2014  . Benign essential hypertension 03/26/2014  . Spinal stenosis,  lumbar region, with neurogenic claudication 12/21/2013  . Osteoarthritis of right hip 07/07/2013  . Morbid obesity ([place]) 08/01/2012  . Precordial pain 05/24/2012  . S/P total knee arthroplasty 01/26/2012  . Erectile dysfunction 01/26/2012  . Preventive measure 01/26/2012    Willow Ora 12/22/2016, Howie Ill PM  [facility] [address] [place], [place], [address] Phone: [phone]   Fax:   [phone]  Name: ACHERON SUGG MRN: [id number] Date of Birth: September 23, 1960

## 2016-12-25 MED FILL — XARELTO 20 MG TABLET: 20 | 30 days supply | Qty: 30 | Fill #3

## 2016-12-27 ENCOUNTER — Ambulatory Visit: Payer: 59 | Admitting: Physical Therapy

## 2016-12-27 ENCOUNTER — Encounter: Payer: Self-pay | Admitting: Physical Therapy

## 2016-12-27 DIAGNOSIS — R2689 Other abnormalities of gait and mobility: Secondary | ICD-10-CM | POA: Diagnosis not present

## 2016-12-27 DIAGNOSIS — R42 Dizziness and giddiness: Secondary | ICD-10-CM | POA: Diagnosis not present

## 2016-12-27 DIAGNOSIS — R2681 Unsteadiness on feet: Secondary | ICD-10-CM | POA: Diagnosis not present

## 2016-12-27 DIAGNOSIS — R29898 Other symptoms and signs involving the musculoskeletal system: Secondary | ICD-10-CM | POA: Diagnosis not present

## 2016-12-27 NOTE — Patient Instructions (Addendum)
Ankle Plantar Flexion / Dorsiflexion, Standing    Stand while holding a stable object. Rise up on toes. Then rock back on heels. Hold each position 5 seconds. Repeat _10_ times per session. Do _1-2_ sessions per day.  Copyright  VHI. All rights reserved.     Hip Abduction Band Work  Standing with red band around both ankles and hold onto stable object. Keeping body upright using your core lift one leg out to side and then slowly back in.  Alternate legs, performing 10 reps each side.  1-2 times a day.    Standing Hip March  Place band loop around the arches and toes of one foot and anchor under the other onet. March the leg straight up in the air with toes flexed up so band stays in place. 10 reps each side 1-2 times a day     Functional Quadriceps: Sit to Stand    Sit on edge of chair (lower the surface the more the challenge will be) feet flat on floor. Stand upright, extending knees fully. Slowly sit back down. Repeat ___10 times per set. Do _1 sets per session. Do _1-2_ sessions per day.  http://orth.exer.us/734   Copyright  VHI. All rights reserved.

## 2016-12-27 NOTE — Therapy (Signed)
[facility] [address] [place], [place], [address] Phone: [phone]   Fax:  [phone]  Physical Therapy Treatment  Patient Details  Name: Travis Huff MRN: [id number] Date of Birth: 08-Dec-1960 Referring Provider: Erline Levine MD   Encounter Date: 12/27/2016  PT End of Session - 12/27/16 1151    Visit Number  4    Number of Visits  17    Date for PT Re-Evaluation  01/27/17    Authorization Type  UMR    PT Start Time  1148    PT Stop Time  1230    PT Time Calculation (min)  42 min    Equipment Utilized During Treatment  Gait belt    Activity Tolerance  Patient tolerated treatment well    Behavior During Therapy  [facility] for tasks assessed/performed       Past Medical History:  Diagnosis Date  . Arthritis   . Asthma   . GERD (gastroesophageal reflux disease)    uses tums  . Hypertension   . Viral pericarditis     Past Surgical History:  Procedure Laterality Date  . ABDOMINAL EXPOSURE N/A 06/23/2016   Procedure: ABDOMINAL EXPOSURE;  Surgeon: Rosetta Posner, MD;  Location: [facility];  Service: Vascular;  Laterality: N/A;  . ANTERIOR LATERAL LUMBAR FUSION 4 LEVELS Right 06/23/2016   Procedure: Right  Lumbar two-three Lumbar three-four Lumbar four-five Anterolateral lumbar interbody fusion;  Surgeon: Erline Levine, MD;  Location: [facility];  Service: Neurosurgery;  Laterality: Right;  . ANTERIOR LUMBAR FUSION N/A 06/23/2016   Procedure: Lumbar five -sacral one  Anterior lumbar interbody fusion with Dr. Sherren Mocha Early for approach;  Surgeon: Erline Levine, MD;  Location: [facility];  Service: Neurosurgery;  Laterality: N/A;  . APPLICATION OF INTRAOPERATIVE CT SCAN N/A 06/26/2016   Procedure: APPLICATION OF INTRAOPERATIVE CT SCAN;  Surgeon: Erline Levine, MD;  Location: [facility];  Service: Neurosurgery;  Laterality: N/A;  . CARDIOVASCULAR STRESS TEST     Negative in May 2014  . COLONOSCOPY    . HERNIA REPAIR    . JOINT REPLACEMENT     . KNEE SURGERY    . POSTERIOR LUMBAR FUSION 4 LEVEL N/A 06/26/2016   Procedure: Thoracic ten to Pelvis fixation with AIRO;  Surgeon: Erline Levine, MD;  Location: [facility];  Service: Neurosurgery;  Laterality: N/A;  . TENDON REPAIR     right hand  . TOTAL HIP ARTHROPLASTY Right 05/25/2014   Procedure: RIGHT TOTAL HIP ARTHROPLASTY ANTERIOR APPROACH;  Surgeon: Rod Can, MD;  Location: [facility];  Service: Orthopedics;  Laterality: Right;  . TOTAL KNEE ARTHROPLASTY Bilateral     There were no vitals filed for this visit.  Subjective Assessment - 12/27/16 1150    Subjective  No new complaints. No falls or pain to report. States HEP is going well: reports the strengthening ex's are getting easier, balance one's continue to be hard.     Pertinent History  anterior lumbar surgery with fusion T10 to pelvis 06/2016; post-op DVT and PE; rt posterior parietal infarct (undetermined age); OA; bil TKR; Rt THR; obesity    How long can you stand comfortably?  stood for 2 hours on Sunday (big improvement since back surgery)    Patient Stated Goals  Improve his balance.     Currently in Pain?  No/denies          [facility] Adult PT Treatment/Exercise - 12/27/16 1710      Exercises   Other Exercises  advanced strengthening HEP and issued new handouts. refer to pt instructions for full details.           Balance Exercises - 12/27/16 1218      Balance Exercises: Standing   Rockerboard  Anterior/posterior;Lateral;Head turns;EC;20 seconds;10 reps;Intermittent UE support      Balance Exercises: Standing   Rebounder Limitations  performed both ways on balance board: EO rocking board with emphasis on tall posture, progressing to holding board steady with EC no head movementes, progressing to EC head movements left<>right, up<>down, and diagonals both ways. no UE support with EO, fingertip support with EC activities. min guard to min assist for balance with cues on posture and weight shifitng to assist with  balance.         PT Education - 12/27/16 1712    Education provided  Yes    Education Details  updated strengthening HEP    Person(s) Educated  Patient    Methods  Explanation;Demonstration;Verbal cues;Handout    Comprehension  Verbalized understanding;Returned demonstration       PT Short Term Goals - 12/18/16 2107      PT SHORT TERM GOAL #1   Title  Patient will be independent with HEP to address balance deficits. (Target date for all STGs 12/28/16; date extended to 01/04/17)    Time  4    Period  Weeks    Status  New      PT SHORT TERM GOAL #2   Title  Patient will improve bil LE strength and balance as evidenced by decreased 5x sit to stand time to <12 seconds.    Baseline  11/28/16  15.9 sec    Time  4    Period  Weeks    Status  New      PT SHORT TERM GOAL #3   Title  Patient will improve DGI to >16 to demonstrate improved balance.    Baseline  11/28/16  12/24    Time  4    Period  Weeks    Status  New      PT SHORT TERM GOAL #4   Title  Patient will verbalize understanding of fall prevention education related to improving safety at home.    Time  4    Period  Weeks    Status  New        PT Long Term Goals - 11/28/16 1733      PT LONG TERM GOAL #1   Title  Patient will be independent with updated HEP addressing balance deficits (Target for all LTGs 01/28/16)    Time  8    Period  Weeks    Status  New    Target Date  01/27/17      PT LONG TERM GOAL #2   Title  Patient will improve 5x sit to stand to <10 sec as indication of improved LE strength and balance.    Time  8    Period  Weeks    Status  New      PT LONG TERM GOAL #3   Title  Patient will improve DGI score to >=19/24 to indicate progress towards lesser fall risk.    Time  8    Period  Weeks    Status  New      PT LONG TERM GOAL #4   Title  Patient will verbalize a plan for community-based activity upon discharge from PT.     Time  8    Period  Weeks  Status  New             Plan - 12/27/16 1151    Clinical Impression Statement  Today's skilled session updated pt's strengthening HEP and then focused on high level balance activities. Pt is progressing toward goals and should benefit from continued PT to progress toward unmet goals.     Rehab Potential  Good    Clinical Impairments Affecting Rehab Potential  obesity, neuropathy, ?vestibular deficit    PT Frequency  2x / week    PT Duration  8 weeks    PT Treatment/Interventions  ADLs/Self Care Home Management;Gait training;DME Instruction;Stair training;Functional mobility training;Therapeutic activities;Therapeutic exercise;Balance training;Neuromuscular re-education;Patient/family education;Passive range of motion;Vestibular;Visual/perceptual remediation/compensation    PT Next Visit Plan  try walking stick(s) outdoors as weather permits; balance activities, LE strengthening activities    Consulted and Agree with Plan of Care  Patient       Patient will benefit from skilled therapeutic intervention in order to improve the following deficits and impairments:  Abnormal gait, Decreased balance, Decreased mobility, Decreased knowledge of use of DME, Decreased coordination, Impaired flexibility, Impaired sensation, Postural dysfunction, Obesity  Visit Diagnosis: Other abnormalities of gait and mobility  Unsteadiness on feet  Dizziness and giddiness  Other symptoms and signs involving the musculoskeletal system     Problem List Patient Active Problem List   Diagnosis Date Noted  . Nondisplaced fracture of distal phalanx of left great toe, initial encounter for closed fracture 11/30/2016  . Open dislocation of left second PIP of the foot 11/29/2016  . Scoliosis of thoracolumbar spine 06/23/2016  . Headache 06/20/2016  . Anxiety, generalized 05/11/2016  . Primary osteoarthritis of right hip 05/25/2014  . Benign essential hypertension 03/26/2014  . Spinal stenosis, lumbar region, with  neurogenic claudication 12/21/2013  . Osteoarthritis of right hip 07/07/2013  . Morbid obesity ([place]) 08/01/2012  . Precordial pain 05/24/2012  . S/P total knee arthroplasty 01/26/2012  . Erectile dysfunction 01/26/2012  . Preventive measure 01/26/2012    Willow Ora, PTA, [facility] [address], [place], [place] [address] [phone] 12/27/16, 5:14 PM   Name: Travis Huff MRN: [id number] Date of Birth: [birth year]

## 2016-12-28 ENCOUNTER — Encounter: Payer: Self-pay | Admitting: Physical Therapy

## 2016-12-28 ENCOUNTER — Ambulatory Visit: Payer: 59 | Admitting: Physical Therapy

## 2016-12-28 DIAGNOSIS — R29898 Other symptoms and signs involving the musculoskeletal system: Secondary | ICD-10-CM | POA: Diagnosis not present

## 2016-12-28 DIAGNOSIS — R2681 Unsteadiness on feet: Secondary | ICD-10-CM

## 2016-12-28 DIAGNOSIS — R42 Dizziness and giddiness: Secondary | ICD-10-CM | POA: Diagnosis not present

## 2016-12-28 DIAGNOSIS — R2689 Other abnormalities of gait and mobility: Secondary | ICD-10-CM | POA: Diagnosis not present

## 2016-12-29 NOTE — Therapy (Signed)
[facility] [address] [place], [place], [address] Phone: [phone]   Fax:  [phone]  Physical Therapy Treatment  Patient Details  Name: Travis Huff MRN: [id number] Date of Birth: [birth year] Referring Provider: Erline Levine MD   Encounter Date: 12/28/2016  PT End of Session - 12/28/16 1619    Visit Number  5    Number of Visits  17    Date for PT Re-Evaluation  01/27/17    Authorization Type  UMR    PT Start Time  2025    PT Stop Time  1700    PT Time Calculation (min)  43 min    Equipment Utilized During Treatment  Gait belt    Activity Tolerance  Patient tolerated treatment well    Behavior During Therapy  [facility] for tasks assessed/performed       Past Medical History:  Diagnosis Date  . Arthritis   . Asthma   . GERD (gastroesophageal reflux disease)    uses tums  . Hypertension   . Viral pericarditis     Past Surgical History:  Procedure Laterality Date  . ABDOMINAL EXPOSURE N/A 06/23/2016   Procedure: ABDOMINAL EXPOSURE;  Surgeon: Rosetta Posner, MD;  Location: [facility];  Service: Vascular;  Laterality: N/A;  . ANTERIOR LATERAL LUMBAR FUSION 4 LEVELS Right 06/23/2016   Procedure: Right  Lumbar two-three Lumbar three-four Lumbar four-five Anterolateral lumbar interbody fusion;  Surgeon: Erline Levine, MD;  Location: [facility];  Service: Neurosurgery;  Laterality: Right;  . ANTERIOR LUMBAR FUSION N/A 06/23/2016   Procedure: Lumbar five -sacral one  Anterior lumbar interbody fusion with Dr. Sherren Mocha Early for approach;  Surgeon: Erline Levine, MD;  Location: [facility];  Service: Neurosurgery;  Laterality: N/A;  . APPLICATION OF INTRAOPERATIVE CT SCAN N/A 06/26/2016   Procedure: APPLICATION OF INTRAOPERATIVE CT SCAN;  Surgeon: Erline Levine, MD;  Location: [facility];  Service: Neurosurgery;  Laterality: N/A;  . CARDIOVASCULAR STRESS TEST     Negative in May 2014  . COLONOSCOPY    . HERNIA REPAIR    . JOINT REPLACEMENT     . KNEE SURGERY    . POSTERIOR LUMBAR FUSION 4 LEVEL N/A 06/26/2016   Procedure: Thoracic ten to Pelvis fixation with AIRO;  Surgeon: Erline Levine, MD;  Location: [facility];  Service: Neurosurgery;  Laterality: N/A;  . TENDON REPAIR     right hand  . TOTAL HIP ARTHROPLASTY Right 05/25/2014   Procedure: RIGHT TOTAL HIP ARTHROPLASTY ANTERIOR APPROACH;  Surgeon: Rod Can, MD;  Location: [facility];  Service: Orthopedics;  Laterality: Right;  . TOTAL KNEE ARTHROPLASTY Bilateral     There were no vitals filed for this visit.  Subjective Assessment - 12/28/16 1619    Subjective  No new complaints. No falls or pain to report. Not too sore after yesterday.     Pertinent History  anterior lumbar surgery with fusion T10 to pelvis 06/2016; post-op DVT and PE; rt posterior parietal infarct (undetermined age); OA; bil TKR; Rt THR; obesity    How long can you stand comfortably?  stood for 2 hours on Sunday (big improvement since back surgery)    Patient Stated Goals  Improve his balance.     Currently in Pain?  No/denies          [facility] Adult PT Treatment/Exercise - 12/28/16 1620      High Level Balance   High Level Balance Activities  Marching forwards;Marching backwards tandem fwd/bwd, toe walk fwd/bwd  High Level Balance Comments  red mats next to counter top: 3 laps each with intermittent UE touch to counter for balance, cues on posture and ex form. min guard to min assist for balance.           Balance Exercises - 12/28/16 1659      Balance Exercises: Standing   Rockerboard  Anterior/posterior;Lateral;Head turns;EO;EC;30 seconds;10 reps    Balance Beam  on blue foam beam: tandem gait fwd/bwd, then side stepping left<>right, 3 laps each with light UE support on bars for balance. cues on posture and weight shifting; standing across blue foam beam- fwd stepping to floor and back onto beam, then bwd stepping to floor and back onto beam, light UE support on bars with cues for increased step  length/weight shifting. min to mod assist for balance.       Balance Exercises: Standing   Rebounder Limitations  performed both ways on large balance board with no UE support, intermittent touch to bars for balance: EO rocking board with emphasis on tall posture, EO holding board steady for alt UE raises, then bil UE raises, progressing to holding board steady for EC no head movements, progressing to EC head movements left<>right, up<>down and diagonals both ways. min to mod assist for balance with cues on posture, stance position and weight shifting for balance assistance.          PT Short Term Goals - 12/18/16 2107      PT SHORT TERM GOAL #1   Title  Patient will be independent with HEP to address balance deficits. (Target date for all STGs 12/28/16; date extended to 01/04/17)    Time  4    Period  Weeks    Status  New      PT SHORT TERM GOAL #2   Title  Patient will improve bil LE strength and balance as evidenced by decreased 5x sit to stand time to <12 seconds.    Baseline  11/28/16  15.9 sec    Time  4    Period  Weeks    Status  New      PT SHORT TERM GOAL #3   Title  Patient will improve DGI to >16 to demonstrate improved balance.    Baseline  11/28/16  12/24    Time  4    Period  Weeks    Status  New      PT SHORT TERM GOAL #4   Title  Patient will verbalize understanding of fall prevention education related to improving safety at home.    Time  4    Period  Weeks    Status  New        PT Long Term Goals - 11/28/16 1733      PT LONG TERM GOAL #1   Title  Patient will be independent with updated HEP addressing balance deficits (Target for all LTGs 01/28/16)    Time  8    Period  Weeks    Status  New    Target Date  01/27/17      PT LONG TERM GOAL #2   Title  Patient will improve 5x sit to stand to <10 sec as indication of improved LE strength and balance.    Time  8    Period  Weeks    Status  New      PT LONG TERM GOAL #3   Title  Patient will  improve DGI score to >=19/24 to indicate progress towards  lesser fall risk.    Time  8    Period  Weeks    Status  New      PT LONG TERM GOAL #4   Title  Patient will verbalize a plan for community-based activity upon discharge from PT.     Time  8    Period  Weeks    Status  New            Plan - 12/28/16 1620    Clinical Impression Statement  Today's skilled session continued to address high level balance with emphasis on compliant surfaces and with vision removed. Pt continues to be challenged by both of these. Pt also demo's bil ankle weakness/instability (right > left) which needs to be monitored when on compliant surfaces. Pt is progressing toward goals and should benefit from continued PT to progress toward unmet goals.    Rehab Potential  Good    Clinical Impairments Affecting Rehab Potential  obesity, neuropathy, ?vestibular deficit    PT Frequency  2x / week    PT Duration  8 weeks    PT Treatment/Interventions  ADLs/Self Care Home Management;Gait training;DME Instruction;Stair training;Functional mobility training;Therapeutic activities;Therapeutic exercise;Balance training;Neuromuscular re-education;Patient/family education;Passive range of motion;Vestibular;Visual/perceptual remediation/compensation    PT Next Visit Plan  try walking stick(s) outdoors as weather permits; balance activities, LE strengthening activities, ankle strengthening (? thereband ex's)    Consulted and Agree with Plan of Care  Patient       Patient will benefit from skilled therapeutic intervention in order to improve the following deficits and impairments:  Abnormal gait, Decreased balance, Decreased mobility, Decreased knowledge of use of DME, Decreased coordination, Impaired flexibility, Impaired sensation, Postural dysfunction, Obesity  Visit Diagnosis: Other abnormalities of gait and mobility  Unsteadiness on feet  Dizziness and giddiness     Problem List Patient Active Problem List    Diagnosis Date Noted  . Nondisplaced fracture of distal phalanx of left great toe, initial encounter for closed fracture 11/30/2016  . Open dislocation of left second PIP of the foot 11/29/2016  . Scoliosis of thoracolumbar spine 06/23/2016  . Headache 06/20/2016  . Anxiety, generalized 05/11/2016  . Primary osteoarthritis of right hip 05/25/2014  . Benign essential hypertension 03/26/2014  . Spinal stenosis, lumbar region, with neurogenic claudication 12/21/2013  . Osteoarthritis of right hip 07/07/2013  . Morbid obesity ([place]) 08/01/2012  . Precordial pain 05/24/2012  . S/P total knee arthroplasty 01/26/2012  . Erectile dysfunction 01/26/2012  . Preventive measure 01/26/2012    Willow Ora, PTA, [facility] [address] [place], [place] [address] [phone] 12/29/16, 2:24 PM   Name: Travis Huff MRN: [id number] Date of Birth: [birth year]

## 2017-01-03 ENCOUNTER — Encounter: Payer: Self-pay | Admitting: Physical Therapy

## 2017-01-03 ENCOUNTER — Ambulatory Visit: Payer: 59 | Attending: Neurosurgery | Admitting: Physical Therapy

## 2017-01-03 DIAGNOSIS — R2689 Other abnormalities of gait and mobility: Secondary | ICD-10-CM | POA: Diagnosis present

## 2017-01-03 DIAGNOSIS — R2681 Unsteadiness on feet: Secondary | ICD-10-CM | POA: Diagnosis present

## 2017-01-03 DIAGNOSIS — R29898 Other symptoms and signs involving the musculoskeletal system: Secondary | ICD-10-CM | POA: Diagnosis present

## 2017-01-03 DIAGNOSIS — R42 Dizziness and giddiness: Secondary | ICD-10-CM | POA: Insufficient documentation

## 2017-01-03 NOTE — Therapy (Signed)
[facility] [address] [place], [place], [address] Phone: [phone]   Fax:  [phone]  Physical Therapy Treatment  Patient Details  Name: Travis Huff MRN: [id number] Date of Birth: January 16, 1960 Referring Provider: Erline Levine MD   Encounter Date: 01/03/2017  PT End of Session - 01/03/17 1537    Visit Number  6    Number of Visits  17    Date for PT Re-Evaluation  01/27/17    Authorization Type  UMR    PT Start Time  1535    PT Stop Time  1615    PT Time Calculation (min)  40 min    Equipment Utilized During Treatment  Gait belt    Activity Tolerance  Patient tolerated treatment well    Behavior During Therapy  [facility] for tasks assessed/performed       Past Medical History:  Diagnosis Date  . Arthritis   . Asthma   . GERD (gastroesophageal reflux disease)    uses tums  . Hypertension   . Viral pericarditis     Past Surgical History:  Procedure Laterality Date  . ABDOMINAL EXPOSURE N/A 06/23/2016   Procedure: ABDOMINAL EXPOSURE;  Surgeon: Rosetta Posner, MD;  Location: [facility];  Service: Vascular;  Laterality: N/A;  . ANTERIOR LATERAL LUMBAR FUSION 4 LEVELS Right 06/23/2016   Procedure: Right  Lumbar two-three Lumbar three-four Lumbar four-five Anterolateral lumbar interbody fusion;  Surgeon: Erline Levine, MD;  Location: [facility];  Service: Neurosurgery;  Laterality: Right;  . ANTERIOR LUMBAR FUSION N/A 06/23/2016   Procedure: Lumbar five -sacral one  Anterior lumbar interbody fusion with Dr. Sherren Mocha Early for approach;  Surgeon: Erline Levine, MD;  Location: [facility];  Service: Neurosurgery;  Laterality: N/A;  . APPLICATION OF INTRAOPERATIVE CT SCAN N/A 06/26/2016   Procedure: APPLICATION OF INTRAOPERATIVE CT SCAN;  Surgeon: Erline Levine, MD;  Location: [facility];  Service: Neurosurgery;  Laterality: N/A;  . CARDIOVASCULAR STRESS TEST     Negative in May 2014  . COLONOSCOPY    . HERNIA REPAIR    . JOINT REPLACEMENT     . KNEE SURGERY    . POSTERIOR LUMBAR FUSION 4 LEVEL N/A 06/26/2016   Procedure: Thoracic ten to Pelvis fixation with AIRO;  Surgeon: Erline Levine, MD;  Location: [facility];  Service: Neurosurgery;  Laterality: N/A;  . TENDON REPAIR     right hand  . TOTAL HIP ARTHROPLASTY Right 05/25/2014   Procedure: RIGHT TOTAL HIP ARTHROPLASTY ANTERIOR APPROACH;  Surgeon: Rod Can, MD;  Location: [facility];  Service: Orthopedics;  Laterality: Right;  . TOTAL KNEE ARTHROPLASTY Bilateral     There were no vitals filed for this visit.  Subjective Assessment - 01/03/17 1537    Subjective  No new complaints. No falls or pain to report.     Pertinent History  anterior lumbar surgery with fusion T10 to pelvis 06/2016; post-op DVT and PE; rt posterior parietal infarct (undetermined age); OA; bil TKR; Rt THR; obesity    How long can you stand comfortably?  stood for 2 hours on Sunday (big improvement since back surgery)    Patient Stated Goals  Improve his balance.     Currently in Pain?  No/denies         [facility] PT Assessment - 01/03/17 1542      Transfers   Transfers  Sit to Stand;Stand to Sit    Sit to Stand  5: Supervision    Five time sit  to stand comments   12.09 no UE support using standard height chair    Stand to Sit  6: Modified independent (Device/Increase time)      Standardized Balance Assessment   Standardized Balance Assessment  Dynamic Gait Index      Dynamic Gait Index   Level Surface  Mild Impairment    Change in Gait Speed  Normal    Gait with Horizontal Head Turns  Mild Impairment    Gait with Vertical Head Turns  Mild Impairment    Gait and Pivot Turn  Mild Impairment    Step Over Obstacle  Mild Impairment    Step Around Obstacles  Normal    Steps  Mild Impairment    Total Score  18    DGI comment:  <19 = indicates fall risk          [facility] Adult PT Treatment/Exercise - 01/03/17 1542      Self-Care   Other Self-Care Comments   reviewed and issued fall prevention  stratagies to pt with handout after verbal review.       Exercises   Other Exercises   reviewed all current HEP issued to date with pt reporting no issues.           PT Education - 01/03/17 1540    Education Details  re-issued corner balance HEP as pt reports he does not recall getting this handout and has not been doing them; issued and reviewed fall prevention strategies    Person(s) Educated  Patient    Methods  Explanation;Demonstration;Verbal cues;Handout    Comprehension  Verbalized understanding;Returned demonstration       PT Short Term Goals - 01/03/17 1538      PT SHORT TERM GOAL #1   Title  Patient will be independent with HEP to address balance deficits. (Target date for all STGs 12/28/16; date extended to 01/04/17)    Baseline  01/03/17: met today except for corner balance which was reissued today    Status  Achieved      PT SHORT TERM GOAL #2   Title  Patient will improve bil LE strength and balance as evidenced by decreased 5x sit to stand time to <12 seconds.    Baseline  11/28/16  15.9 sec no UE support, improved from eval, just not to goal    Time  --    Period  --    Status  Partially Met      PT SHORT TERM GOAL #3   Title  Patient will improve DGI to >16 to demonstrate improved balance.    Baseline  01/03/17: 18/24 scored today     Time  --    Period  --    Status  Achieved      PT SHORT TERM GOAL #4   Title  Patient will verbalize understanding of fall prevention education related to improving safety at home.    Baseline  01/03/17: met today    Time  --    Period  --    Status  Achieved        PT Long Term Goals - 11/28/16 1733      PT LONG TERM GOAL #1   Title  Patient will be independent with updated HEP addressing balance deficits (Target for all LTGs 01/28/16)    Time  8    Period  Weeks    Status  New    Target Date  01/27/17      PT LONG TERM  GOAL #2   Title  Patient will improve 5x sit to stand to <10 sec as indication of improved LE  strength and balance.    Time  8    Period  Weeks    Status  New      PT LONG TERM GOAL #3   Title  Patient will improve DGI score to >=19/24 to indicate progress towards lesser fall risk.    Time  8    Period  Weeks    Status  New      PT LONG TERM GOAL #4   Title  Patient will verbalize a plan for community-based activity upon discharge from PT.     Time  8    Period  Weeks    Status  New            Plan - 01/03/17 1538    Clinical Impression Statement  Pt has met 3/4 STGs and partially met the remaining STG. Pt is progressing toward goals and should benefit from continued PT to progress toward unmet goals.     Rehab Potential  Good    Clinical Impairments Affecting Rehab Potential  obesity, neuropathy, ?vestibular deficit    PT Frequency  2x / week    PT Duration  8 weeks    PT Treatment/Interventions  ADLs/Self Care Home Management;Gait training;DME Instruction;Stair training;Functional mobility training;Therapeutic activities;Therapeutic exercise;Balance training;Neuromuscular re-education;Patient/family education;Passive range of motion;Vestibular;Visual/perceptual remediation/compensation    PT Next Visit Plan  try walking stick(s) outdoors as weather permits; balance activities, LE strengthening activities, ankle strengthening (? thereband ex's)    Consulted and Agree with Plan of Care  Patient       Patient will benefit from skilled therapeutic intervention in order to improve the following deficits and impairments:  Abnormal gait, Decreased balance, Decreased mobility, Decreased knowledge of use of DME, Decreased coordination, Impaired flexibility, Impaired sensation, Postural dysfunction, Obesity  Visit Diagnosis: Other abnormalities of gait and mobility  Unsteadiness on feet  Dizziness and giddiness  Other symptoms and signs involving the musculoskeletal system     Problem List Patient Active Problem List   Diagnosis Date Noted  . Nondisplaced fracture  of distal phalanx of left great toe, initial encounter for closed fracture 11/30/2016  . Open dislocation of left second PIP of the foot 11/29/2016  . Scoliosis of thoracolumbar spine 06/23/2016  . Headache 06/20/2016  . Anxiety, generalized 05/11/2016  . Primary osteoarthritis of right hip 05/25/2014  . Benign essential hypertension 03/26/2014  . Spinal stenosis, lumbar region, with neurogenic claudication 12/21/2013  . Osteoarthritis of right hip 07/07/2013  . Morbid obesity ([place]) 08/01/2012  . Precordial pain 05/24/2012  . S/P total knee arthroplasty 01/26/2012  . Erectile dysfunction 01/26/2012  . Preventive measure 01/26/2012    Willow Ora, PTA, [facility] [address], [place], [place] [address] [phone] 01/03/17, 11:56 PM   Name: DANEIL BEEM MRN: [id number] Date of Birth: 1960-07-04

## 2017-01-03 NOTE — Patient Instructions (Addendum)
Perform these in a corner with a chair in front of you for safety:  Feet Apart (Compliant Surface) Varied Arm Positions - Eyes Closed    Stand on compliant surface: _pillow/s_ with feet shoulder width apart and arms out. Close eyes and visualize upright position. Hold _30_ seconds. Repeat _3_ times per session. Do _1-2_ sessions per day.  Copyright  VHI. All rights reserved.    Feet Apart (Compliant Surface) Head Motion - Eyes Closed    Stand on compliant surface: _pillow/s_ with feet shoulder width apart. Close eyes and move head slowly: 1. Up and down x 10 2. Left and right x 10 3. Up right/down left x 10 4. Up left/down right x 10    Do _1-2_ sessions per day.   Copyright  VHI. All rights reserved.    Fall Prevention in the Home Falls can cause injuries. They can happen to people of all ages. There are many things you can do to make your home safe and to help prevent falls. What can I do on the outside of my home?  Regularly fix the edges of walkways and driveways and fix any cracks.  Remove anything that might make you trip as you walk through a door, such as a raised step or threshold.  Trim any bushes or trees on the path to your home.  Use bright outdoor lighting.  Clear any walking paths of anything that might make someone trip, such as rocks or tools.  Regularly check to see if handrails are loose or broken. Make sure that both sides of any steps have handrails.  Any raised decks and porches should have guardrails on the edges.  Have any leaves, snow, or ice cleared regularly.  Use sand or salt on walking paths during winter.  Clean up any spills in your garage right away. This includes oil or grease spills. What can I do in the bathroom?  Use night lights.  Install grab bars by the toilet and in the tub and shower. Do not use towel bars as grab bars.  Use non-skid mats or decals in the tub or shower.  If you need to sit down in the shower, use  a plastic, non-slip stool.  Keep the floor dry. Clean up any water that spills on the floor as soon as it happens.  Remove soap buildup in the tub or shower regularly.  Attach bath mats securely with double-sided non-slip rug tape.  Do not have throw rugs and other things on the floor that can make you trip. What can I do in the bedroom?  Use night lights.  Make sure that you have a light by your bed that is easy to reach.  Do not use any sheets or blankets that are too big for your bed. They should not hang down onto the floor.  Have a firm chair that has side arms. You can use this for support while you get dressed.  Do not have throw rugs and other things on the floor that can make you trip. What can I do in the kitchen?  Clean up any spills right away.  Avoid walking on wet floors.  Keep items that you use a lot in easy-to-reach places.  If you need to reach something above you, use a strong step stool that has a grab bar.  Keep electrical cords out of the way.  Do not use floor polish or wax that makes floors slippery. If you must use wax, use non-skid floor  wax.  Do not have throw rugs and other things on the floor that can make you trip. What can I do with my stairs?  Do not leave any items on the stairs.  Make sure that there are handrails on both sides of the stairs and use them. Fix handrails that are broken or loose. Make sure that handrails are as long as the stairways.  Check any carpeting to make sure that it is firmly attached to the stairs. Fix any carpet that is loose or worn.  Avoid having throw rugs at the top or bottom of the stairs. If you do have throw rugs, attach them to the floor with carpet tape.  Make sure that you have a light switch at the top of the stairs and the bottom of the stairs. If you do not have them, ask someone to add them for you. What else can I do to help prevent falls?  Wear shoes that: ? Do not have high heels. ? Have  rubber bottoms. ? Are comfortable and fit you well. ? Are closed at the toe. Do not wear sandals.  If you use a stepladder: ? Make sure that it is fully opened. Do not climb a closed stepladder. ? Make sure that both sides of the stepladder are locked into place. ? Ask someone to hold it for you, if possible.  Clearly mark and make sure that you can see: ? Any grab bars or handrails. ? First and last steps. ? Where the edge of each step is.  Use tools that help you move around (mobility aids) if they are needed. These include: ? Canes. ? Walkers. ? Scooters. ? Crutches.  Turn on the lights when you go into a dark area. Replace any light bulbs as soon as they burn out.  Set up your furniture so you have a clear path. Avoid moving your furniture around.  If any of your floors are uneven, fix them.  If there are any pets around you, be aware of where they are.  Review your medicines with your doctor. Some medicines can make you feel dizzy. This can increase your chance of falling. Ask your doctor what other things that you can do to help prevent falls. This information is not intended to replace advice given to you by your health care provider. Make sure you discuss any questions you have with your health care provider. Document Released: 10/15/2008 Document Revised: 05/27/2015 Document Reviewed: 01/23/2014 Elsevier Interactive Patient Education  [employer].

## 2017-01-04 ENCOUNTER — Encounter: Payer: Self-pay | Admitting: Physical Therapy

## 2017-01-04 ENCOUNTER — Ambulatory Visit: Payer: 59 | Admitting: Physical Therapy

## 2017-01-04 DIAGNOSIS — R2689 Other abnormalities of gait and mobility: Secondary | ICD-10-CM

## 2017-01-04 DIAGNOSIS — R29898 Other symptoms and signs involving the musculoskeletal system: Secondary | ICD-10-CM | POA: Diagnosis not present

## 2017-01-04 DIAGNOSIS — R2681 Unsteadiness on feet: Secondary | ICD-10-CM | POA: Diagnosis not present

## 2017-01-04 DIAGNOSIS — R42 Dizziness and giddiness: Secondary | ICD-10-CM | POA: Diagnosis not present

## 2017-01-04 MED FILL — SILDENAFIL CITRATE 100 MG T: 100 | 30 days supply | Qty: 6 | Fill #6

## 2017-01-04 NOTE — Patient Instructions (Signed)
Perform these in a corner with a chair in front of you for safety:  Feet Apart (Compliant Surface) Varied Arm Positions - Eyes Closed    Stand on compliant surface: _pillow/s_ with feet shoulder width apart and arms out. Close eyes and visualize upright position. Hold _30_ seconds. Repeat _3_ times per session. Do _1-2_ sessions per day.  Copyright  VHI. All rights reserved.    Feet Apart (Compliant Surface) Head Motion - Eyes Closed    Stand on compliant surface: _pillow/s_ with feet shoulder width apart. Close eyes and move head slowly: 1. Up and down x 10 2. Left and right x 10 3. Up right/down left x 10 4. Up left/down right x 10    Do _1-2_ sessions per day.  With Support    Stand on one leg in neutral spine holding support. Hold with light arm/hand support for __10__ seconds. Then let go and try to stand another 20 seconds without support.  Repeat on other leg. Do __3__ repetitions, ___1_ sets.  http://bt.exer.us/34   Copyright  VHI. All rights reserved.

## 2017-01-04 NOTE — Therapy (Signed)
[facility] [address] [place], [place], [address] Phone: [phone]   Fax:  [phone]  Physical Therapy Treatment  Patient Details  Name: Travis Huff MRN: [id number] Date of Birth: 01-22-1960 Referring Provider: Erline Levine MD   Encounter Date: 01/04/2017  PT End of Session - 01/04/17 1633    Visit Number  7    Number of Visits  17    Date for PT Re-Evaluation  01/27/17    Authorization Type  UMR    PT Start Time  1535    PT Stop Time  1615    PT Time Calculation (min)  40 min    Activity Tolerance  Patient tolerated treatment well    Behavior During Therapy  [facility] for tasks assessed/performed       Past Medical History:  Diagnosis Date  . Arthritis   . Asthma   . GERD (gastroesophageal reflux disease)    uses tums  . Hypertension   . Viral pericarditis     Past Surgical History:  Procedure Laterality Date  . ABDOMINAL EXPOSURE N/A 06/23/2016   Procedure: ABDOMINAL EXPOSURE;  Surgeon: Rosetta Posner, MD;  Location: [facility];  Service: Vascular;  Laterality: N/A;  . ANTERIOR LATERAL LUMBAR FUSION 4 LEVELS Right 06/23/2016   Procedure: Right  Lumbar two-three Lumbar three-four Lumbar four-five Anterolateral lumbar interbody fusion;  Surgeon: Erline Levine, MD;  Location: [place];  Service: Neurosurgery;  Laterality: Right;  . ANTERIOR LUMBAR FUSION N/A 06/23/2016   Procedure: Lumbar five -sacral one  Anterior lumbar interbody fusion with Dr. Sherren Mocha Early for approach;  Surgeon: Erline Levine, MD;  Location: [place];  Service: Neurosurgery;  Laterality: N/A;  . APPLICATION OF INTRAOPERATIVE CT SCAN N/A 06/26/2016   Procedure: APPLICATION OF INTRAOPERATIVE CT SCAN;  Surgeon: Erline Levine, MD;  Location: [place] [facility];  Service: Neurosurgery;  Laterality: N/A;  . CARDIOVASCULAR STRESS TEST     Negative in May 2014  . COLONOSCOPY    . HERNIA REPAIR    . JOINT REPLACEMENT    . KNEE SURGERY    . POSTERIOR LUMBAR FUSION 4  LEVEL N/A 06/26/2016   Procedure: Thoracic ten to Pelvis fixation with AIRO;  Surgeon: Erline Levine, MD;  Location: [employer];  Service: Neurosurgery;  Laterality: N/A;  . TENDON REPAIR     right hand  . TOTAL HIP ARTHROPLASTY Right 05/25/2014   Procedure: RIGHT TOTAL HIP ARTHROPLASTY ANTERIOR APPROACH;  Surgeon: Rod Can, MD;  Location: [facility];  Service: Orthopedics;  Laterality: Right;  . TOTAL KNEE ARTHROPLASTY Bilateral     There were no vitals filed for this visit.  Subjective Assessment - 01/04/17 1533    Subjective  Find he is not using his cane at work and rarely staggers or drifts. Reports he has been doing all his leg exercises with bands but not the standing in the corner exercises. Asking if his balance will always be this way.     Pertinent History  anterior lumbar surgery with fusion T10 to pelvis 06/2016; post-op DVT and PE; rt posterior parietal infarct (undetermined age); OA; bil TKR; Rt THR; obesity    How long can you stand comfortably?  stood for 2 hours on Sunday (big improvement since back surgery)    Patient Stated Goals  Improve his balance.     Currently in Pain?  No/denies  [facility] Adult PT Treatment/Exercise - 01/04/17 0001      Ambulation/Gait   Ambulation/Gait  Yes    Ambulation/Gait Assistance  6: Modified independent (Device/Increase time)    Ambulation/Gait Assistance Details  cane or no cane; dual task with arranging cards in order, then looking for targets 1-15 (required 2 laps to find all targets); denied dizziness and only mild imbalance (?partly due to leg length discrepancy and/or scoliosis)    Ambulation Distance (Feet)  600 Feet 120, 120    Assistive device  Straight cane;None    Gait Pattern  Step-through pattern;Decreased arm swing - right;Decreased arm swing - left;Decreased stride length;Decreased weight shift to right;Lateral trunk lean to right;Lateral trunk lean to left;Decreased trunk rotation;Wide base of  support    Ambulation Surface  Indoor          Balance Exercises - 01/04/17 1625      Balance Exercises: Standing   Standing Eyes Opened  Wide (BOA);Foam/compliant surface;Head turns left foot on lateral rockerboard, right on green air disc    Standing Eyes Closed  Wide (BOA);Foam/compliant surface;30 secs left foot on lateral rockerboard, right on green air disc    Tandem Stance  Eyes open;Intermittent upper extremity support;3 reps;Foam/compliant surface each leg; max 6 seconds    SLS  Eyes open;Foam/compliant surface;Intermittent upper extremity support;4 reps max 8 seconds    Stepping Strategy  Anterior;Posterior;Foam/compliant surface;5 reps blue mat in // bars        PT Education - 01/04/17 1628    Education provided  Yes    Education Details  see addition to HEP (SLS); discussed getting walking poles (bilateral) to begin walking outside over unlevel ground (pt wants to be able to walk neighbor's land--10+ acres).     Person(s) Educated  Patient    Methods  Explanation;Demonstration;Handout    Comprehension  Verbalized understanding;Returned demonstration       PT Short Term Goals - 01/03/17 1538      PT SHORT TERM GOAL #1   Title  Patient will be independent with HEP to address balance deficits. (Target date for all STGs 12/28/16; date extended to 01/04/17)    Baseline  01/03/17: met today except for corner balance which was reissued today    Status  Achieved      PT SHORT TERM GOAL #2   Title  Patient will improve bil LE strength and balance as evidenced by decreased 5x sit to stand time to <12 seconds.    Baseline  11/28/16  15.9 sec no UE support, improved from eval, just not to goal    Time  --    Period  --    Status  Partially Met      PT SHORT TERM GOAL #3   Title  Patient will improve DGI to >16 to demonstrate improved balance.    Baseline  01/03/17: 18/24 scored today     Time  --    Period  --    Status  Achieved      PT SHORT TERM GOAL #4   Title   Patient will verbalize understanding of fall prevention education related to improving safety at home.    Baseline  01/03/17: met today    Time  --    Period  --    Status  Achieved        PT Long Term Goals - 11/28/16 1733      PT LONG TERM GOAL #1   Title  Patient will be independent with  updated HEP addressing balance deficits (Target for all LTGs 01/28/16)    Time  8    Period  Weeks    Status  New    Target Date  01/27/17      PT LONG TERM GOAL #2   Title  Patient will improve 5x sit to stand to <10 sec as indication of improved LE strength and balance.    Time  8    Period  Weeks    Status  New      PT LONG TERM GOAL #3   Title  Patient will improve DGI score to >=19/24 to indicate progress towards lesser fall risk.    Time  8    Period  Weeks    Status  New      PT LONG TERM GOAL #4   Title  Patient will verbalize a plan for community-based activity upon discharge from PT.     Time  8    Period  Weeks    Status  New            Plan - 01/04/17 6553    Clinical Impression Statement  Session focused on balance and gait training with divided attention tasks. Patient continues with imbalance (increased lateral sway lt>rt) when walking without his cane, even more evident with dual tasks. Patient can continue to benefit from PT to achieve LTGs and reduce fall risk.     Rehab Potential  Good    Clinical Impairments Affecting Rehab Potential  obesity, neuropathy, ?vestibular deficit    PT Frequency  2x / week    PT Duration  8 weeks    PT Treatment/Interventions  ADLs/Self Care Home Management;Gait training;DME Instruction;Stair training;Functional mobility training;Therapeutic activities;Therapeutic exercise;Balance training;Neuromuscular re-education;Patient/family education;Passive range of motion;Vestibular;Visual/perceptual remediation/compensation    PT Next Visit Plan  ask if he began doing his corner exercises or if he looked into buying walking poles; compliant  surfaces; dual tasks when walking; balance activities, LE strengthening activities, ankle strengthening     Consulted and Agree with Plan of Care  Patient       Patient will benefit from skilled therapeutic intervention in order to improve the following deficits and impairments:  Abnormal gait, Decreased balance, Decreased mobility, Decreased knowledge of use of DME, Decreased coordination, Impaired flexibility, Impaired sensation, Postural dysfunction, Obesity  Visit Diagnosis: Other abnormalities of gait and mobility  Unsteadiness on feet     Problem List Patient Active Problem List   Diagnosis Date Noted  . Nondisplaced fracture of distal phalanx of left great toe, initial encounter for closed fracture 11/30/2016  . Open dislocation of left second PIP of the foot 11/29/2016  . Scoliosis of thoracolumbar spine 06/23/2016  . Headache 06/20/2016  . Anxiety, generalized 05/11/2016  . Primary osteoarthritis of right hip 05/25/2014  . Benign essential hypertension 03/26/2014  . Spinal stenosis, lumbar region, with neurogenic claudication 12/21/2013  . Osteoarthritis of right hip 07/07/2013  . Morbid obesity ([place]) 08/01/2012  . Precordial pain 05/24/2012  . S/P total knee arthroplasty 01/26/2012  . Erectile dysfunction 01/26/2012  . Preventive measure 01/26/2012    Rexanne Mano, PT 01/04/2017, 4:40 PM  [facility] [address] [place], [place], [address] Phone: [phone]   Fax:  [phone]  Name: Travis Huff MRN: [id number] Date of Birth: [birth year]

## 2017-01-08 ENCOUNTER — Ambulatory Visit: Payer: 59 | Admitting: Physical Therapy

## 2017-01-08 ENCOUNTER — Encounter: Payer: Self-pay | Admitting: Physical Therapy

## 2017-01-08 DIAGNOSIS — R2681 Unsteadiness on feet: Secondary | ICD-10-CM

## 2017-01-08 DIAGNOSIS — R29898 Other symptoms and signs involving the musculoskeletal system: Secondary | ICD-10-CM | POA: Diagnosis not present

## 2017-01-08 DIAGNOSIS — R2689 Other abnormalities of gait and mobility: Secondary | ICD-10-CM | POA: Diagnosis not present

## 2017-01-08 DIAGNOSIS — R42 Dizziness and giddiness: Secondary | ICD-10-CM | POA: Diagnosis not present

## 2017-01-08 NOTE — Therapy (Signed)
[facility] [address] [place], [place], [address] Phone: [phone]   Fax:  [phone]  Physical Therapy Treatment  Patient Details  Name: Travis Huff MRN: [id number] Date of Birth: 03/07/1960 Referring Provider: Erline Levine MD   Encounter Date: 01/08/2017  PT End of Session - 01/08/17 1635    Visit Number  8    Number of Visits  17    Date for PT Re-Evaluation  01/27/17    Authorization Type  UMR    PT Start Time  1535    PT Stop Time  9798    PT Time Calculation (min)  46 min    Activity Tolerance  Patient tolerated treatment well    Behavior During Therapy  [facility] for tasks assessed/performed       Past Medical History:  Diagnosis Date  . Arthritis   . Asthma   . GERD (gastroesophageal reflux disease)    uses tums  . Hypertension   . Viral pericarditis     Past Surgical History:  Procedure Laterality Date  . ABDOMINAL EXPOSURE N/A 06/23/2016   Procedure: ABDOMINAL EXPOSURE;  Surgeon: Rosetta Posner, MD;  Location: [facility];  Service: Vascular;  Laterality: N/A;  . ANTERIOR LATERAL LUMBAR FUSION 4 LEVELS Right 06/23/2016   Procedure: Right  Lumbar two-three Lumbar three-four Lumbar four-five Anterolateral lumbar interbody fusion;  Surgeon: Erline Levine, MD;  Location: [facility];  Service: Neurosurgery;  Laterality: Right;  . ANTERIOR LUMBAR FUSION N/A 06/23/2016   Procedure: Lumbar five -sacral one  Anterior lumbar interbody fusion with Dr. Sherren Mocha Early for approach;  Surgeon: Erline Levine, MD;  Location: [place];  Service: Neurosurgery;  Laterality: N/A;  . APPLICATION OF INTRAOPERATIVE CT SCAN N/A 06/26/2016   Procedure: APPLICATION OF INTRAOPERATIVE CT SCAN;  Surgeon: Erline Levine, MD;  Location: [facility];  Service: Neurosurgery;  Laterality: N/A;  . CARDIOVASCULAR STRESS TEST     Negative in May 2014  . COLONOSCOPY    . HERNIA REPAIR    . JOINT REPLACEMENT    . KNEE SURGERY    . POSTERIOR LUMBAR FUSION 4  LEVEL N/A 06/26/2016   Procedure: Thoracic ten to Pelvis fixation with AIRO;  Surgeon: Erline Levine, MD;  Location: [place];  Service: Neurosurgery;  Laterality: N/A;  . TENDON REPAIR     right hand  . TOTAL HIP ARTHROPLASTY Right 05/25/2014   Procedure: RIGHT TOTAL HIP ARTHROPLASTY ANTERIOR APPROACH;  Surgeon: Rod Can, MD;  Location: [place];  Service: Orthopedics;  Laterality: Right;  . TOTAL KNEE ARTHROPLASTY Bilateral     There were no vitals filed for this visit.  Subjective Assessment - 01/08/17 1542    Subjective  Reports he fell on his garage steps (going down and the heel of his left shoe got caught between the steps and he fell forward onto hands and knees). Last time he fell was also on these steps (foot slipped out from under him).     Pertinent History  anterior lumbar surgery with fusion T10 to pelvis 06/2016; post-op DVT and PE; rt posterior parietal infarct (undetermined age); OA; bil TKR; Rt THR; obesity    How long can you stand comfortably?  stood for 2 hours on Sunday (big improvement since back surgery)    Patient Stated Goals  Improve his balance.     Currently in Pain?  No/denies  [facility] Adult PT Treatment/Exercise - 01/08/17 1626      Ambulation/Gait   Ambulation/Gait Assistance  6: Modified independent (Device/Increase time);4: Min guard    Ambulation/Gait Assistance Details  with cane, able to maintain path with head turns (even diagonally); no cane pt with staggering/drifting with head turns    Ambulation Distance (Feet)  300 Feet    Assistive device  Straight cane;None    Gait Pattern  Step-through pattern;Decreased stride length;Decreased weight shift to right;Lateral trunk lean to right;Lateral trunk lean to left;Decreased trunk rotation;Wide base of support    Ambulation Surface  Indoor    Stairs  Yes    Stairs Assistance  5: Supervision    Stairs Assistance Details (indicate cue type and reason)  with single rail vs  cane; catches heel worse with step-to pattern when descending (compared to step through)    Stair Management Technique  One rail Left;Alternating pattern;Step to pattern;Forwards;With cane      Exercises   Other Exercises   supine bil LE coordination exercise (touching targets with heel on opposite leg)          Balance Exercises - 01/08/17 1629      Balance Exercises: Standing   Standing Eyes Opened  Narrow base of support (BOS);Wide (BOA);Foam/compliant surface;30 secs;Head turns feet close, feet partial stagger    Standing Eyes Closed  Wide (BOA);Foam/compliant surface;30 secs    Stepping Strategy  Posterior stepping off black beam post when LOB    Step Ups  Forward;6 inch;UE support 1 LLE only due to Rt knee pain    Balance Beam  black beam, feet shoulder width, intermittent single vs double UE suppport; step off/on anterior/posterior        PT Education - 01/08/17 1625    Education provided  Yes    Education Details  add rail to garage steps; until then, use his cane; don't carry things in front of him (obstructing his vision).     Person(s) Educated  Patient    Methods  Explanation    Comprehension  Verbalized understanding       PT Short Term Goals - 01/03/17 1538      PT SHORT TERM GOAL #1   Title  Patient will be independent with HEP to address balance deficits. (Target date for all STGs 12/28/16; date extended to 01/04/17)    Baseline  01/03/17: met today except for corner balance which was reissued today    Status  Achieved      PT SHORT TERM GOAL #2   Title  Patient will improve bil LE strength and balance as evidenced by decreased 5x sit to stand time to <12 seconds.    Baseline  11/28/16  15.9 sec no UE support, improved from eval, just not to goal    Time  --    Period  --    Status  Partially Met      PT SHORT TERM GOAL #3   Title  Patient will improve DGI to >16 to demonstrate improved balance.    Baseline  01/03/17: 18/24 scored today     Time  --     Period  --    Status  Achieved      PT SHORT TERM GOAL #4   Title  Patient will verbalize understanding of fall prevention education related to improving safety at home.    Baseline  01/03/17: met today    Time  --    Period  --    Status  Achieved        PT Long Term Goals - 11/28/16 1733      PT LONG TERM GOAL #1   Title  Patient will be independent with updated HEP addressing balance deficits (Target for all LTGs 01/28/16)    Time  8    Period  Weeks    Status  New    Target Date  01/27/17      PT LONG TERM GOAL #2   Title  Patient will improve 5x sit to stand to <10 sec as indication of improved LE strength and balance.    Time  8    Period  Weeks    Status  New      PT LONG TERM GOAL #3   Title  Patient will improve DGI score to >=19/24 to indicate progress towards lesser fall risk.    Time  8    Period  Weeks    Status  New      PT LONG TERM GOAL #4   Title  Patient will verbalize a plan for community-based activity upon discharge from PT.     Time  8    Period  Weeks    Status  New            Plan - 01/08/17 1636    Clinical Impression Statement  Session focused on safety with stairs (due to recent 2nd fall on his garage steps) and neuromuscular re-education/balance training. Patient struggles with imbalance due to severity of decreased sensation in bil LEs.     Rehab Potential  Good    Clinical Impairments Affecting Rehab Potential  obesity, neuropathy, ?vestibular deficit    PT Frequency  2x / week    PT Duration  8 weeks    PT Treatment/Interventions  ADLs/Self Care Home Management;Gait training;DME Instruction;Stair training;Functional mobility training;Therapeutic activities;Therapeutic exercise;Balance training;Neuromuscular re-education;Patient/family education;Passive range of motion;Vestibular;Visual/perceptual remediation/compensation    PT Next Visit Plan  ask if he began doing his corner exercises or if he looked into buying walking poles;  compliant surfaces; dual tasks when walking; balance activities, LE strengthening activities, ankle strengthening     Consulted and Agree with Plan of Care  Patient       Patient will benefit from skilled therapeutic intervention in order to improve the following deficits and impairments:  Abnormal gait, Decreased balance, Decreased mobility, Decreased knowledge of use of DME, Decreased coordination, Impaired flexibility, Impaired sensation, Postural dysfunction, Obesity  Visit Diagnosis: Unsteadiness on feet  Other abnormalities of gait and mobility     Problem List Patient Active Problem List   Diagnosis Date Noted  . Nondisplaced fracture of distal phalanx of left great toe, initial encounter for closed fracture 11/30/2016  . Open dislocation of left second PIP of the foot 11/29/2016  . Scoliosis of thoracolumbar spine 06/23/2016  . Headache 06/20/2016  . Anxiety, generalized 05/11/2016  . Primary osteoarthritis of right hip 05/25/2014  . Benign essential hypertension 03/26/2014  . Spinal stenosis, lumbar region, with neurogenic claudication 12/21/2013  . Osteoarthritis of right hip 07/07/2013  . Morbid obesity ([place]) 08/01/2012  . Precordial pain 05/24/2012  . S/P total knee arthroplasty 01/26/2012  . Erectile dysfunction 01/26/2012  . Preventive measure 01/26/2012    Rexanne Mano, PT 01/08/2017, 4:39 PM  [facility] [address] [place], [place], [address] Phone: [phone]   Fax:  [phone]  Name: BRIGIDO MERA MRN: [id number] Date of Birth: 10-07-1960

## 2017-01-09 ENCOUNTER — Encounter: Payer: Self-pay | Admitting: Physical Therapy

## 2017-01-09 ENCOUNTER — Ambulatory Visit: Payer: 59 | Admitting: Physical Therapy

## 2017-01-09 DIAGNOSIS — R42 Dizziness and giddiness: Secondary | ICD-10-CM | POA: Diagnosis not present

## 2017-01-09 DIAGNOSIS — R2689 Other abnormalities of gait and mobility: Secondary | ICD-10-CM

## 2017-01-09 DIAGNOSIS — R2681 Unsteadiness on feet: Secondary | ICD-10-CM

## 2017-01-09 DIAGNOSIS — R29898 Other symptoms and signs involving the musculoskeletal system: Secondary | ICD-10-CM | POA: Diagnosis not present

## 2017-01-10 ENCOUNTER — Encounter: Payer: Self-pay | Admitting: Physical Therapy

## 2017-01-10 NOTE — Therapy (Signed)
[facility] [address] [place], [place], [address] Phone: [phone]   Fax:  [phone]  Physical Therapy Treatment  Patient Details  Name: Travis Huff MRN: [id number] Date of Birth: [birth year] Referring Provider: Erline Levine MD   Encounter Date: 01/09/2017  PT End of Session - 01/09/17 1622    Visit Number  9    Number of Visits  17    Date for PT Re-Evaluation  01/27/17    Authorization Type  UMR    PT Start Time  1618    PT Stop Time  1700    PT Time Calculation (min)  42 min    Equipment Utilized During Treatment  Gait belt    Activity Tolerance  Patient tolerated treatment well    Behavior During Therapy  [facility] for tasks assessed/performed       Past Medical History:  Diagnosis Date  . Arthritis   . Asthma   . GERD (gastroesophageal reflux disease)    uses tums  . Hypertension   . Viral pericarditis     Past Surgical History:  Procedure Laterality Date  . ABDOMINAL EXPOSURE N/A 06/23/2016   Procedure: ABDOMINAL EXPOSURE;  Surgeon: Rosetta Posner, MD;  Location: [facility];  Service: Vascular;  Laterality: N/A;  . ANTERIOR LATERAL LUMBAR FUSION 4 LEVELS Right 06/23/2016   Procedure: Right  Lumbar two-three Lumbar three-four Lumbar four-five Anterolateral lumbar interbody fusion;  Surgeon: Erline Levine, MD;  Location: [facility];  Service: Neurosurgery;  Laterality: Right;  . ANTERIOR LUMBAR FUSION N/A 06/23/2016   Procedure: Lumbar five -sacral one  Anterior lumbar interbody fusion with Dr. Sherren Mocha Early for approach;  Surgeon: Erline Levine, MD;  Location: [facility];  Service: Neurosurgery;  Laterality: N/A;  . APPLICATION OF INTRAOPERATIVE CT SCAN N/A 06/26/2016   Procedure: APPLICATION OF INTRAOPERATIVE CT SCAN;  Surgeon: Erline Levine, MD;  Location: [facility];  Service: Neurosurgery;  Laterality: N/A;  . CARDIOVASCULAR STRESS TEST     Negative in May 2014  . COLONOSCOPY    . HERNIA REPAIR    . JOINT REPLACEMENT     . KNEE SURGERY    . POSTERIOR LUMBAR FUSION 4 LEVEL N/A 06/26/2016   Procedure: Thoracic ten to Pelvis fixation with AIRO;  Surgeon: Erline Levine, MD;  Location: [facility];  Service: Neurosurgery;  Laterality: N/A;  . TENDON REPAIR     right hand  . TOTAL HIP ARTHROPLASTY Right 05/25/2014   Procedure: RIGHT TOTAL HIP ARTHROPLASTY ANTERIOR APPROACH;  Surgeon: Rod Can, MD;  Location: [facility];  Service: Orthopedics;  Laterality: Right;  . TOTAL KNEE ARTHROPLASTY Bilateral     There were no vitals filed for this visit.  Subjective Assessment - 01/10/17 1706    Subjective  No new complaints. No new falls. Having some lower back pain today, noticed it when he got up this am. Has not started the corner balance ex's at this time, nor has he had time to look into walking poles as well.     Pertinent History  anterior lumbar surgery with fusion T10 to pelvis 06/2016; post-op DVT and PE; rt posterior parietal infarct (undetermined age); OA; bil TKR; Rt THR; obesity    How long can you stand comfortably?  stood for 2 hours on Sunday (big improvement since back surgery)    Patient Stated Goals  Improve his balance.     Currently in Pain?  Yes    Pain Score  2  Pain Location  Back    Pain Orientation  Lower    Pain Descriptors / Indicators  Aching    Pain Onset  Today    Pain Frequency  Intermittent    Aggravating Factors   nothing so far    Pain Relieving Factors  has not needed to try anything as it's not that bad         Balance Exercises - 01/09/17 1628      Balance Exercises: Standing   Standing Eyes Opened  Wide (BOA);Foam/compliant surface;30 secs;Other reps (comment);Limitations feet hip width apart    SLS  Eyes open;Foam/compliant surface;3 reps;10 secs;Limitations    Wall Bumps  Hip    Wall Bumps-Hips  Eyes opened;Eyes closed;Anterior/posterior;10 reps;Limitations    Stepping Strategy  Anterior;Posterior;Foam/compliant surface;10 reps    Other Standing Exercises  stepping  stragey: standing across red beam- stepping fwd to floor/back onto beam, then stepping bwd to floor/back onto beam, light to no UE support with min to mod assist for balance.       Balance Exercises: Standing   Standing Eyes Opened Limitations  on airex in corner with chair in front for safety: EC no head movements, progressing to EC head movements with min assist and UE touch to walls/chair for     SLS Limitations  on 1 inch foam with light fingertip support (2 fingers with each hand)    Wall Bumps Limitations  across red beam: EO progressing to EC with cues on ex form and technique; No UE support with min guard assist for balance.           PT Short Term Goals - 01/03/17 1538      PT SHORT TERM GOAL #1   Title  Patient will be independent with HEP to address balance deficits. (Target date for all STGs 12/28/16; date extended to 01/04/17)    Baseline  01/03/17: met today except for corner balance which was reissued today    Status  Achieved      PT SHORT TERM GOAL #2   Title  Patient will improve bil LE strength and balance as evidenced by decreased 5x sit to stand time to <12 seconds.    Baseline  11/28/16  15.9 sec no UE support, improved from eval, just not to goal    Time  --    Period  --    Status  Partially Met      PT SHORT TERM GOAL #3   Title  Patient will improve DGI to >16 to demonstrate improved balance.    Baseline  01/03/17: 18/24 scored today     Time  --    Period  --    Status  Achieved      PT SHORT TERM GOAL #4   Title  Patient will verbalize understanding of fall prevention education related to improving safety at home.    Baseline  01/03/17: met today    Time  --    Period  --    Status  Achieved        PT Long Term Goals - 11/28/16 1733      PT LONG TERM GOAL #1   Title  Patient will be independent with updated HEP addressing balance deficits (Target for all LTGs 01/28/16)    Time  8    Period  Weeks    Status  New    Target Date  01/27/17      PT  LONG TERM GOAL #2  Title  Patient will improve 5x sit to stand to <10 sec as indication of improved LE strength and balance.    Time  8    Period  Weeks    Status  New      PT LONG TERM GOAL #3   Title  Patient will improve DGI score to >=19/24 to indicate progress towards lesser fall risk.    Time  8    Period  Weeks    Status  New      PT LONG TERM GOAL #4   Title  Patient will verbalize a plan for community-based activity upon discharge from PT.     Time  8    Period  Weeks    Status  New          Plan - 01/09/17 1622    Clinical Impression Statement  Today's skilled session continued to focus on balance and ankle strengthening with only fatigue reported. Pt is progressing toward goals and should benefit from continued PT to progress toward unmet goal.    Rehab Potential  Good    Clinical Impairments Affecting Rehab Potential  obesity, neuropathy, ?vestibular deficit    PT Frequency  2x / week    PT Duration  8 weeks    PT Treatment/Interventions  ADLs/Self Care Home Management;Gait training;DME Instruction;Stair training;Functional mobility training;Therapeutic activities;Therapeutic exercise;Balance training;Neuromuscular re-education;Patient/family education;Passive range of motion;Vestibular;Visual/perceptual remediation/compensation    PT Next Visit Plan  compliant surfaces; dual tasks when walking; balance activities, LE strengthening activities, ankle strengthening     Consulted and Agree with Plan of Care  Patient       Patient will benefit from skilled therapeutic intervention in order to improve the following deficits and impairments:  Abnormal gait, Decreased balance, Decreased mobility, Decreased knowledge of use of DME, Decreased coordination, Impaired flexibility, Impaired sensation, Postural dysfunction, Obesity  Visit Diagnosis: Unsteadiness on feet  Other abnormalities of gait and mobility  Dizziness and giddiness     Problem List Patient Active  Problem List   Diagnosis Date Noted  . Nondisplaced fracture of distal phalanx of left great toe, initial encounter for closed fracture 11/30/2016  . Open dislocation of left second PIP of the foot 11/29/2016  . Scoliosis of thoracolumbar spine 06/23/2016  . Headache 06/20/2016  . Anxiety, generalized 05/11/2016  . Primary osteoarthritis of right hip 05/25/2014  . Benign essential hypertension 03/26/2014  . Spinal stenosis, lumbar region, with neurogenic claudication 12/21/2013  . Osteoarthritis of right hip 07/07/2013  . Morbid obesity ([place]) 08/01/2012  . Precordial pain 05/24/2012  . S/P total knee arthroplasty 01/26/2012  . Erectile dysfunction 01/26/2012  . Preventive measure 01/26/2012    Willow Ora, PTA, [facility] [address] [place], [place] [address] [phone] 01/10/17, 5:16 PM   Name: Travis Huff MRN: [id number] Date of Birth: 1960/03/05

## 2017-01-15 ENCOUNTER — Encounter: Payer: Self-pay | Admitting: Physical Therapy

## 2017-01-15 ENCOUNTER — Ambulatory Visit: Payer: 59 | Admitting: Physical Therapy

## 2017-01-15 DIAGNOSIS — R42 Dizziness and giddiness: Secondary | ICD-10-CM | POA: Diagnosis not present

## 2017-01-15 DIAGNOSIS — R29898 Other symptoms and signs involving the musculoskeletal system: Secondary | ICD-10-CM

## 2017-01-15 DIAGNOSIS — R2681 Unsteadiness on feet: Secondary | ICD-10-CM | POA: Diagnosis not present

## 2017-01-15 DIAGNOSIS — R2689 Other abnormalities of gait and mobility: Secondary | ICD-10-CM

## 2017-01-16 NOTE — Therapy (Signed)
[facility] [address] [place], [place], [address] Phone: [phone]   Fax:  [phone]  Physical Therapy Treatment  Patient Details  Name: Travis Huff MRN: [id number] Date of Birth: 02/24/1960 Referring Provider: Erline Levine MD   Encounter Date: 01/15/2017  PT End of Session - 01/15/17 1620    Visit Number  10    Number of Visits  17    Date for PT Re-Evaluation  01/27/17    Authorization Type  UMR    PT Start Time  6301    PT Stop Time  1658    PT Time Calculation (min)  41 min    Equipment Utilized During Treatment  Gait belt    Activity Tolerance  Patient tolerated treatment well    Behavior During Therapy  [facility] for tasks assessed/performed       Past Medical History:  Diagnosis Date  . Arthritis   . Asthma   . GERD (gastroesophageal reflux disease)    uses tums  . Hypertension   . Viral pericarditis     Past Surgical History:  Procedure Laterality Date  . ABDOMINAL EXPOSURE N/A 06/23/2016   Procedure: ABDOMINAL EXPOSURE;  Surgeon: Rosetta Posner, MD;  Location: [facility];  Service: Vascular;  Laterality: N/A;  . ANTERIOR LATERAL LUMBAR FUSION 4 LEVELS Right 06/23/2016   Procedure: Right  Lumbar two-three Lumbar three-four Lumbar four-five Anterolateral lumbar interbody fusion;  Surgeon: Erline Levine, MD;  Location: [facility];  Service: Neurosurgery;  Laterality: Right;  . ANTERIOR LUMBAR FUSION N/A 06/23/2016   Procedure: Lumbar five -sacral one  Anterior lumbar interbody fusion with Dr. Sherren Mocha Early for approach;  Surgeon: Erline Levine, MD;  Location: [facility];  Service: Neurosurgery;  Laterality: N/A;  . APPLICATION OF INTRAOPERATIVE CT SCAN N/A 06/26/2016   Procedure: APPLICATION OF INTRAOPERATIVE CT SCAN;  Surgeon: Erline Levine, MD;  Location: [facility];  Service: Neurosurgery;  Laterality: N/A;  . CARDIOVASCULAR STRESS TEST     Negative in May 2014  . COLONOSCOPY    . HERNIA REPAIR    . JOINT REPLACEMENT     . KNEE SURGERY    . POSTERIOR LUMBAR FUSION 4 LEVEL N/A 06/26/2016   Procedure: Thoracic ten to Pelvis fixation with AIRO;  Surgeon: Erline Levine, MD;  Location: [facility];  Service: Neurosurgery;  Laterality: N/A;  . TENDON REPAIR     right hand  . TOTAL HIP ARTHROPLASTY Right 05/25/2014   Procedure: RIGHT TOTAL HIP ARTHROPLASTY ANTERIOR APPROACH;  Surgeon: Rod Can, MD;  Location: [facility];  Service: Orthopedics;  Laterality: Right;  . TOTAL KNEE ARTHROPLASTY Bilateral     There were no vitals filed for this visit.  Subjective Assessment - 01/15/17 1620    Subjective  No new complaints. No falls or pain to report. Back is better now.     Pertinent History  anterior lumbar surgery with fusion T10 to pelvis 06/2016; post-op DVT and PE; rt posterior parietal infarct (undetermined age); OA; bil TKR; Rt THR; obesity    How long can you stand comfortably?  stood for 2 hours on Sunday (big improvement since back surgery)    Patient Stated Goals  Improve his balance.     Currently in Pain?  No/denies    Pain Score  0-No pain            [facility] Adult PT Treatment/Exercise - 01/15/17 1622      Transfers   Transfers  Sit to Stand;Stand to  Sit    Sit to Stand  6: Modified independent (Device/Increase time)    Stand to Sit  6: Modified independent (Device/Increase time)      High Level Balance   High Level Balance Activities  Tandem walking;Marching forwards;Marching backwards tandem fwd/bwd    High Level Balance Comments  red mats next to counter top: 3 laps each with intermittent UE touch to counter for balance, cues on posture and ex form. min guard to min assist for balance.           Balance Exercises - 01/15/17 1629      Balance Exercises: Standing   SLS  Eyes open;Foam/compliant surface;3 reps;10 secs;Limitations    Wall Bumps  Hip    Wall Bumps-Hips  Eyes opened;Eyes closed;Anterior/posterior;Foam/compliant surface;10 reps;Limitations    Stepping Strategy   Anterior;Posterior;Foam/compliant surface;10 reps    Rockerboard  Anterior/posterior;Lateral;Head turns;EO;EC;30 seconds;10 reps;Intermittent UE support    Other Standing Exercises  stepping strategy: standing across red beam- fwd off/back on/bwd off, alternating legs with emphasis on step length/height and weight shifting with each step. min to mod assist for balance.       Balance Exercises: Standing   SLS Limitations  on airex with fingertip support on chair:    Wall Bumps Limitations  with feet on airex: EO progressing to [facility] for 10 reps each with min guard assist. cues for knee extension and full upright posture on return.     Rebounder Limitations  performed both ways on balance board: EO rocking board with emphasis on tall posture; holding board steady: EC no head movements, progressing to EC head movements left<>right, up<>down and diagonals both ways. min to mod assist with mostly posterior loss of balance. cues posture, stance position and weight shifting to assist with balance recovery. no UE support with occasional touch to bars for balance correction.           PT Short Term Goals - 01/03/17 1538      PT SHORT TERM GOAL #1   Title  Patient will be independent with HEP to address balance deficits. (Target date for all STGs 12/28/16; date extended to 01/04/17)    Baseline  01/03/17: met today except for corner balance which was reissued today    Status  Achieved      PT SHORT TERM GOAL #2   Title  Patient will improve bil LE strength and balance as evidenced by decreased 5x sit to stand time to <12 seconds.    Baseline  11/28/16  15.9 sec no UE support, improved from eval, just not to goal    Time  --    Period  --    Status  Partially Met      PT SHORT TERM GOAL #3   Title  Patient will improve DGI to >16 to demonstrate improved balance.    Baseline  01/03/17: 18/24 scored today     Time  --    Period  --    Status  Achieved      PT SHORT TERM GOAL #4   Title  Patient will  verbalize understanding of fall prevention education related to improving safety at home.    Baseline  01/03/17: met today    Time  --    Period  --    Status  Achieved        PT Long Term Goals - 11/28/16 1733      PT LONG TERM GOAL #1   Title  Patient will be  independent with updated HEP addressing balance deficits (Target for all LTGs 01/28/16)    Time  8    Period  Weeks    Status  New    Target Date  01/27/17      PT LONG TERM GOAL #2   Title  Patient will improve 5x sit to stand to <10 sec as indication of improved LE strength and balance.    Time  8    Period  Weeks    Status  New      PT LONG TERM GOAL #3   Title  Patient will improve DGI score to >=19/24 to indicate progress towards lesser fall risk.    Time  8    Period  Weeks    Status  New      PT LONG TERM GOAL #4   Title  Patient will verbalize a plan for community-based activity upon discharge from PT.     Time  8    Period  Weeks    Status  New            Plan - 01/15/17 1621    Clinical Impression Statement  Today's skilled session continued to focus on balance with emphasis on complaint surfaces and in single leg stance. Pt continues to fatigue with high level balance, needing short rest breaks for LE's. Pt is progressing toward goals and should benefit from continued PT to progress toward unmet goals.     Rehab Potential  Good    Clinical Impairments Affecting Rehab Potential  obesity, neuropathy, ?vestibular deficit    PT Frequency  2x / week    PT Duration  8 weeks    PT Treatment/Interventions  ADLs/Self Care Home Management;Gait training;DME Instruction;Stair training;Functional mobility training;Therapeutic activities;Therapeutic exercise;Balance training;Neuromuscular re-education;Patient/family education;Passive range of motion;Vestibular;Visual/perceptual remediation/compensation    PT Next Visit Plan  compliant surfaces; dual tasks when walking; balance activities, LE strengthening  activities, ankle strengthening     Consulted and Agree with Plan of Care  Patient       Patient will benefit from skilled therapeutic intervention in order to improve the following deficits and impairments:  Abnormal gait, Decreased balance, Decreased mobility, Decreased knowledge of use of DME, Decreased coordination, Impaired flexibility, Impaired sensation, Postural dysfunction, Obesity  Visit Diagnosis: Unsteadiness on feet  Other abnormalities of gait and mobility  Dizziness and giddiness  Other symptoms and signs involving the musculoskeletal system     Problem List Patient Active Problem List   Diagnosis Date Noted  . Nondisplaced fracture of distal phalanx of left great toe, initial encounter for closed fracture 11/30/2016  . Open dislocation of left second PIP of the foot 11/29/2016  . Scoliosis of thoracolumbar spine 06/23/2016  . Headache 06/20/2016  . Anxiety, generalized 05/11/2016  . Primary osteoarthritis of right hip 05/25/2014  . Benign essential hypertension 03/26/2014  . Spinal stenosis, lumbar region, with neurogenic claudication 12/21/2013  . Osteoarthritis of right hip 07/07/2013  . Morbid obesity ([place]) 08/01/2012  . Precordial pain 05/24/2012  . S/P total knee arthroplasty 01/26/2012  . Erectile dysfunction 01/26/2012  . Preventive measure 01/26/2012    Willow Ora, PTA, [facility] [address] [place], [place] [address] [phone] 01/16/17, 4:09 PM   Name: Travis Huff MRN: [id number] Date of Birth: [birth year]

## 2017-01-17 MED FILL — CYCLOBENZAPRINE HCL 10 MG T: 10 | 30 days supply | Qty: 90 | Fill #3

## 2017-01-17 MED FILL — LISINOPRIL-HCTZ 20-25 MG TA: 20-25 | 90 days supply | Qty: 90 | Fill #2

## 2017-01-18 ENCOUNTER — Encounter: Payer: 59 | Admitting: Physical Therapy

## 2017-01-19 ENCOUNTER — Encounter: Payer: Self-pay | Admitting: Physical Therapy

## 2017-01-19 ENCOUNTER — Ambulatory Visit: Payer: 59 | Admitting: Physical Therapy

## 2017-01-19 DIAGNOSIS — R42 Dizziness and giddiness: Secondary | ICD-10-CM

## 2017-01-19 DIAGNOSIS — R2689 Other abnormalities of gait and mobility: Secondary | ICD-10-CM

## 2017-01-19 DIAGNOSIS — R2681 Unsteadiness on feet: Secondary | ICD-10-CM

## 2017-01-19 DIAGNOSIS — R29898 Other symptoms and signs involving the musculoskeletal system: Secondary | ICD-10-CM | POA: Diagnosis not present

## 2017-01-20 NOTE — Therapy (Signed)
[facility] [address] [name], [place], [address] Phone: [phone]   Fax:  [phone]  Physical Therapy Treatment  Patient Details  Name: Travis Huff MRN: [id number] Date of Birth: 11-25-1960 Referring Provider: Erline Levine MD   Encounter Date: 01/19/2017  PT End of Session - 01/19/17 1632    Visit Number  11    Number of Visits  17    Date for PT Re-Evaluation  01/27/17    Authorization Type  UMR    PT Start Time  3838    PT Stop Time  1700    PT Time Calculation (min)  45 min    Equipment Utilized During Treatment  Gait belt    Activity Tolerance  Patient tolerated treatment well;No increased pain    Behavior During Therapy  WFL for tasks assessed/performed       Past Medical History:  Diagnosis Date  . Arthritis   . Asthma   . GERD (gastroesophageal reflux disease)    uses tums  . Hypertension   . Viral pericarditis     Past Surgical History:  Procedure Laterality Date  . ABDOMINAL EXPOSURE N/A 06/23/2016   Procedure: ABDOMINAL EXPOSURE;  Surgeon: Rosetta Posner, MD;  Location: [facility];  Service: Vascular;  Laterality: N/A;  . ANTERIOR LATERAL LUMBAR FUSION 4 LEVELS Right 06/23/2016   Procedure: Right  Lumbar two-three Lumbar three-four Lumbar four-five Anterolateral lumbar interbody fusion;  Surgeon: Erline Levine, MD;  Location: [facility];  Service: Neurosurgery;  Laterality: Right;  . ANTERIOR LUMBAR FUSION N/A 06/23/2016   Procedure: Lumbar five -sacral one  Anterior lumbar interbody fusion with Dr. Sherren Mocha Early for approach;  Surgeon: Erline Levine, MD;  Location: [facility];  Service: Neurosurgery;  Laterality: N/A;  . APPLICATION OF INTRAOPERATIVE CT SCAN N/A 06/26/2016   Procedure: APPLICATION OF INTRAOPERATIVE CT SCAN;  Surgeon: Erline Levine, MD;  Location: [facility];  Service: Neurosurgery;  Laterality: N/A;  . CARDIOVASCULAR STRESS TEST     Negative in May 2014  . COLONOSCOPY    . HERNIA REPAIR    .  JOINT REPLACEMENT    . KNEE SURGERY    . POSTERIOR LUMBAR FUSION 4 LEVEL N/A 06/26/2016   Procedure: Thoracic ten to Pelvis fixation with AIRO;  Surgeon: Erline Levine, MD;  Location: [facility];  Service: Neurosurgery;  Laterality: N/A;  . TENDON REPAIR     right hand  . TOTAL HIP ARTHROPLASTY Right 05/25/2014   Procedure: RIGHT TOTAL HIP ARTHROPLASTY ANTERIOR APPROACH;  Surgeon: Rod Can, MD;  Location: [facility];  Service: Orthopedics;  Laterality: Right;  . TOTAL KNEE ARTHROPLASTY Bilateral     There were no vitals filed for this visit.  Subjective Assessment - 01/19/17 1618    Subjective  Reports having had a really bad day yesterday and then again today with regards to his neuropathy. Has not had episodes like this in over a month. Had 2 episodes where he was driving and no longer could feel the pedals and needed to use a flashlight to see them to assist with driving.     Pertinent History  anterior lumbar surgery with fusion T10 to pelvis 06/2016; post-op DVT and PE; rt posterior parietal infarct (undetermined age); OA; bil TKR; Rt THR; obesity    How long can you stand comfortably?  stood for 2 hours on Sunday (big improvement since back surgery)    Patient Stated Goals  Improve his balance.     Currently in  Pain?  No/denies    Pain Score  0-No pain        [facility] Adult PT Treatment/Exercise - 01/19/17 1632      High Level Balance   High Level Balance Activities  Tandem walking;Marching forwards;Marching backwards;Side stepping;Braiding tandem fwd/bwd, fwd, then bwd crossovers    High Level Balance Comments  red mats next to counter top: 3 laps each with intermittent UE touch to counter for balance, cues on posture and ex form. min guard to min assist for balance.       Neuro Re-ed    Neuro Re-ed Details   NMR for balance/mm strengthening: gait along track with cane with- head movements left<>right, up<>down, speed changes with min guard assist.                                  Balance Exercises - 01/19/17 1648      Balance Exercises: Standing   Standing Eyes Closed  Narrow base of support (BOS);Head turns;Foam/compliant surface;Other reps (comment);30 secs;Limitations      Balance Exercises: Standing   Standing Eyes Closed Limitations  airex in corner with chair in front for safety: narrow base of support with EC no head movements, progressing to EC head movements left<>right, up<>down and diagonal both ways. min assist with pt needing occasional touch to chair/wall for balance.           PT Short Term Goals - 01/03/17 1538      PT SHORT TERM GOAL #1   Title  Patient will be independent with HEP to address balance deficits. (Target date for all STGs 12/28/16; date extended to 01/04/17)    Baseline  01/03/17: met today except for corner balance which was reissued today    Status  Achieved      PT SHORT TERM GOAL #2   Title  Patient will improve bil LE strength and balance as evidenced by decreased 5x sit to stand time to <12 seconds.    Baseline  11/28/16  15.9 sec no UE support, improved from eval, just not to goal    Time  --    Period  --    Status  Partially Met      PT SHORT TERM GOAL #3   Title  Patient will improve DGI to >16 to demonstrate improved balance.    Baseline  01/03/17: 18/24 scored today     Time  --    Period  --    Status  Achieved      PT SHORT TERM GOAL #4   Title  Patient will verbalize understanding of fall prevention education related to improving safety at home.    Baseline  01/03/17: met today    Time  --    Period  --    Status  Achieved        PT Long Term Goals - 11/28/16 1733      PT LONG TERM GOAL #1   Title  Patient will be independent with updated HEP addressing balance deficits (Target for all LTGs 01/28/16)    Time  8    Period  Weeks    Status  New    Target Date  01/27/17      PT LONG TERM GOAL #2   Title  Patient will improve 5x sit to stand to <10 sec as indication of improved LE strength and  balance.    Time  8  Period  Weeks    Status  New      PT LONG TERM GOAL #3   Title  Patient will improve DGI score to >=19/24 to indicate progress towards lesser fall risk.    Time  8    Period  Weeks    Status  New      PT LONG TERM GOAL #4   Title  Patient will verbalize a plan for community-based activity upon discharge from PT.     Time  8    Period  Weeks    Status  New            Plan - 01/19/17 1632    Clinical Impression Statement  Discussed pt's recent symptoms with primary PT Barry Brunner, PT) and patient. PT agreed that pt should follow up with his neurosurgeon regarding new symptoms of increased numbness in LE's the past 2 days. She also advised him that if it gets worse to seek urgent care/ER care over weekend. Pt agreed to this and to call his MD on Monday. Session focused on balance reactions with rest breaks so not ot excerbate pt's symptoms with session. Pt reported feeling okay at end of session. Pt is progressing and should benefit from continued PT to progress toward unmet goals.     Rehab Potential  Good    Clinical Impairments Affecting Rehab Potential  obesity, neuropathy, ?vestibular deficit    PT Frequency  2x / week    PT Duration  8 weeks    PT Treatment/Interventions  ADLs/Self Care Home Management;Gait training;DME Instruction;Stair training;Functional mobility training;Therapeutic activities;Therapeutic exercise;Balance training;Neuromuscular re-education;Patient/family education;Passive range of motion;Vestibular;Visual/perceptual remediation/compensation    PT Next Visit Plan  compliant surfaces; dual tasks when walking; balance activities, LE strengthening activities, ankle strengthening; LTGs due next week    Consulted and Agree with Plan of Care  Patient       Patient will benefit from skilled therapeutic intervention in order to improve the following deficits and impairments:  Abnormal gait, Decreased balance, Decreased mobility, Decreased  knowledge of use of DME, Decreased coordination, Impaired flexibility, Impaired sensation, Postural dysfunction, Obesity  Visit Diagnosis: Unsteadiness on feet  Other abnormalities of gait and mobility  Dizziness and giddiness     Problem List Patient Active Problem List   Diagnosis Date Noted  . Nondisplaced fracture of distal phalanx of left great toe, initial encounter for closed fracture 11/30/2016  . Open dislocation of left second PIP of the foot 11/29/2016  . Scoliosis of thoracolumbar spine 06/23/2016  . Headache 06/20/2016  . Anxiety, generalized 05/11/2016  . Primary osteoarthritis of right hip 05/25/2014  . Benign essential hypertension 03/26/2014  . Spinal stenosis, lumbar region, with neurogenic claudication 12/21/2013  . Osteoarthritis of right hip 07/07/2013  . Morbid obesity ([place]) 08/01/2012  . Precordial pain 05/24/2012  . S/P total knee arthroplasty 01/26/2012  . Erectile dysfunction 01/26/2012  . Preventive measure 01/26/2012    Willow Ora, PTA, [facility] [address] [place], [place] [address] [phone] 01/20/17, 6:42 PM   Name: JERROLD HASKELL MRN: [id number] Date of Birth: 09/11/1960

## 2017-01-23 ENCOUNTER — Ambulatory Visit: Payer: 59 | Admitting: Physical Therapy

## 2017-01-23 ENCOUNTER — Encounter: Payer: Self-pay | Admitting: Physical Therapy

## 2017-01-23 DIAGNOSIS — R2689 Other abnormalities of gait and mobility: Secondary | ICD-10-CM | POA: Diagnosis not present

## 2017-01-23 DIAGNOSIS — R42 Dizziness and giddiness: Secondary | ICD-10-CM | POA: Diagnosis not present

## 2017-01-23 DIAGNOSIS — R2681 Unsteadiness on feet: Secondary | ICD-10-CM | POA: Diagnosis not present

## 2017-01-23 DIAGNOSIS — R29898 Other symptoms and signs involving the musculoskeletal system: Secondary | ICD-10-CM | POA: Diagnosis not present

## 2017-01-23 MED FILL — GABAPENTIN 300 MG CAPSULE: 300 | 30 days supply | Qty: 90 | Fill #4

## 2017-01-25 ENCOUNTER — Ambulatory Visit: Payer: 59 | Admitting: Physical Therapy

## 2017-01-25 ENCOUNTER — Encounter: Payer: Self-pay | Admitting: Physical Therapy

## 2017-01-25 DIAGNOSIS — R42 Dizziness and giddiness: Secondary | ICD-10-CM

## 2017-01-25 DIAGNOSIS — R2689 Other abnormalities of gait and mobility: Secondary | ICD-10-CM | POA: Diagnosis not present

## 2017-01-25 DIAGNOSIS — R29898 Other symptoms and signs involving the musculoskeletal system: Secondary | ICD-10-CM | POA: Diagnosis not present

## 2017-01-25 DIAGNOSIS — R2681 Unsteadiness on feet: Secondary | ICD-10-CM

## 2017-01-25 NOTE — Therapy (Addendum)
[facility] [address] [place], [place], [address] Phone: [phone]   Fax:  [phone]  Physical Therapy Treatment and Discharge Summary  Patient Details  Name: Travis Huff MRN: [id number] Date of Birth: June 10, 1960 Referring Provider: Erline Levine MD   Encounter Date: 01/25/2017  PT End of Session - 01/25/17 1622    Visit Number  13    Number of Visits  17    Date for PT Re-Evaluation  01/27/17    Authorization Type  UMR    PT Start Time  1618    PT Stop Time  6789 not all time was needed due to discharge visit    PT Time Calculation (min)  35 min    Equipment Utilized During Treatment  Gait belt    Activity Tolerance  Patient tolerated treatment well;No increased pain    Behavior During Therapy  WFL for tasks assessed/performed       Past Medical History:  Diagnosis Date  . Arthritis   . Asthma   . GERD (gastroesophageal reflux disease)    uses tums  . Hypertension   . Viral pericarditis     Past Surgical History:  Procedure Laterality Date  . ABDOMINAL EXPOSURE N/A 06/23/2016   Procedure: ABDOMINAL EXPOSURE;  Surgeon: Rosetta Posner, MD;  Location: [facility];  Service: Vascular;  Laterality: N/A;  . ANTERIOR LATERAL LUMBAR FUSION 4 LEVELS Right 06/23/2016   Procedure: Right  Lumbar two-three Lumbar three-four Lumbar four-five Anterolateral lumbar interbody fusion;  Surgeon: Erline Levine, MD;  Location: [facility];  Service: Neurosurgery;  Laterality: Right;  . ANTERIOR LUMBAR FUSION N/A 06/23/2016   Procedure: Lumbar five -sacral one  Anterior lumbar interbody fusion with Dr. Sherren Mocha Early for approach;  Surgeon: Erline Levine, MD;  Location: [facility];  Service: Neurosurgery;  Laterality: N/A;  . APPLICATION OF INTRAOPERATIVE CT SCAN N/A 06/26/2016   Procedure: APPLICATION OF INTRAOPERATIVE CT SCAN;  Surgeon: Erline Levine, MD;  Location: [facility];  Service: Neurosurgery;  Laterality: N/A;  . CARDIOVASCULAR STRESS TEST      Negative in May 2014  . COLONOSCOPY    . HERNIA REPAIR    . JOINT REPLACEMENT    . KNEE SURGERY    . POSTERIOR LUMBAR FUSION 4 LEVEL N/A 06/26/2016   Procedure: Thoracic ten to Pelvis fixation with AIRO;  Surgeon: Erline Levine, MD;  Location: [facility];  Service: Neurosurgery;  Laterality: N/A;  . TENDON REPAIR     right hand  . TOTAL HIP ARTHROPLASTY Right 05/25/2014   Procedure: RIGHT TOTAL HIP ARTHROPLASTY ANTERIOR APPROACH;  Surgeon: Rod Can, MD;  Location: [facility];  Service: Orthopedics;  Laterality: Right;  . TOTAL KNEE ARTHROPLASTY Bilateral     There were no vitals filed for this visit.  Subjective Assessment - 01/25/17 1622    Subjective  No new complaints. No falls or pain to report.     Pertinent History  anterior lumbar surgery with fusion T10 to pelvis 06/2016; post-op DVT and PE; rt posterior parietal infarct (undetermined age); OA; bil TKR; Rt THR; obesity    How long can you stand comfortably?  stood for 2 hours on Sunday (big improvement since back surgery)    Patient Stated Goals  Improve his balance.     Currently in Pain?  No/denies    Pain Score  0-No pain         [facility] PT Assessment - 01/25/17 1623      Dynamic Gait Index  Level Surface  Normal    Change in Gait Speed  Normal    Gait with Horizontal Head Turns  Mild Impairment    Gait with Vertical Head Turns  Normal    Gait and Pivot Turn  Mild Impairment    Step Over Obstacle  Mild Impairment    Step Around Obstacles  Normal    Steps  Mild Impairment    Total Score  20    DGI comment:  20/24 today           [facility] Adult PT Treatment/Exercise - 01/25/17 1637      High Level Balance   High Level Balance Activities  Marching forwards;Marching backwards;Tandem walking;Side stepping;Braiding tandem fwd/bwd    High Level Balance Comments  at counter with UE support. attempted toe walking fwd with 1 episode of fwd loss of balance due to stepping on shoe, then significant instabiltiy with  remainder of lap fwd/bwd. Did not add this one to the HEP.        \   PT Short Term Goals - 01/03/17 1538      PT SHORT TERM GOAL #1   Title  Patient will be independent with HEP to address balance deficits. (Target date for all STGs 12/28/16; date extended to 01/04/17)    Baseline  01/03/17: met today except for corner balance which was reissued today    Status  Achieved      PT SHORT TERM GOAL #2   Title  Patient will improve bil LE strength and balance as evidenced by decreased 5x sit to stand time to <12 seconds.    Baseline  11/28/16  15.9 sec no UE support, improved from eval, just not to goal    Time  --    Period  --    Status  Partially Met      PT SHORT TERM GOAL #3   Title  Patient will improve DGI to >16 to demonstrate improved balance.    Baseline  01/03/17: 18/24 scored today     Time  --    Period  --    Status  Achieved      PT SHORT TERM GOAL #4   Title  Patient will verbalize understanding of fall prevention education related to improving safety at home.    Baseline  01/03/17: met today    Time  --    Period  --    Status  Achieved        PT Long Term Goals - 01/25/17 1623      PT LONG TERM GOAL #1   Title  Patient will be independent with updated HEP addressing balance deficits (Target for all LTGs 01/28/16)    Baseline  01/23/17: doing them consistently with more focus on balance ones. still challenging.     Status  Achieved      PT LONG TERM GOAL #2   Title  Patient will improve 5x sit to stand to <10 sec as indication of improved LE strength and balance.    Baseline  01/23/17: 10.94 sec's with no UE support using standard height chair    Status  Partially Met      PT LONG TERM GOAL #3   Title  Patient will improve DGI score to >=19/24 to indicate progress towards lesser fall risk.    Time  01/25/17  20/24     Period  Weeks    Status  Achieved     PT LONG TERM GOAL #4  Title  Patient will verbalize a plan for community-based activity upon  discharge from PT.     Baseline  01/23/17: pt plans to check into [employer] near his work in [place].    Status  Achieved            Plan - 01/25/17 1622    Clinical Impression Statement  Pt met final LTG this session. Remainder of session was focused on advancing HEP to address high level balance issues. Pt is agreeable to discharge today.     Rehab Potential  Good    Clinical Impairments Affecting Rehab Potential  obesity, neuropathy, ?vestibular deficit    PT Frequency  2x / week    PT Duration  8 weeks    PT Treatment/Interventions  ADLs/Self Care Home Management;Gait training;DME Instruction;Stair training;Functional mobility training;Therapeutic activities;Therapeutic exercise;Balance training;Neuromuscular re-education;Patient/family education;Passive range of motion;Vestibular;Visual/perceptual remediation/compensation    PT Next Visit Plan  discharge per PT plan of care    Consulted and Agree with Plan of Care  Patient       Patient will benefit from skilled therapeutic intervention in order to improve the following deficits and impairments:  Abnormal gait, Decreased balance, Decreased mobility, Decreased knowledge of use of DME, Decreased coordination, Impaired flexibility, Impaired sensation, Postural dysfunction, Obesity  Visit Diagnosis: Unsteadiness on feet  Dizziness and giddiness  Other abnormalities of gait and mobility  Other symptoms and signs involving the musculoskeletal system     Problem List Patient Active Problem List   Diagnosis Date Noted  . Nondisplaced fracture of distal phalanx of left great toe, initial encounter for closed fracture 11/30/2016  . Open dislocation of left second PIP of the foot 11/29/2016  . Scoliosis of thoracolumbar spine 06/23/2016  . Headache 06/20/2016  . Anxiety, generalized 05/11/2016  . Primary osteoarthritis of right hip 05/25/2014  . Benign essential hypertension 03/26/2014  . Spinal stenosis, lumbar  region, with neurogenic claudication 12/21/2013  . Osteoarthritis of right hip 07/07/2013  . Morbid obesity ([place]) 08/01/2012  . Precordial pain 05/24/2012  . S/P total knee arthroplasty 01/26/2012  . Erectile dysfunction 01/26/2012  . Preventive measure 01/26/2012    Willow Ora, PTA, [facility] [address] [place], [place] [address] [phone] 01/25/17, 10:48 PM   Name: CASTON COOPERSMITH MRN: [id number] Date of Birth: Oct 29, 1960  PHYSICAL THERAPY DISCHARGE SUMMARY  Visits from Start of Care: 13  Current functional level related to goals / functional outcomes: Patient met 3 of 4 LTGs and partially met 4th goal (improved but not to goal level). He continues to ambulate modified independently with [facility] and is aware he will need to continue to use cane for improved balance due to the severity of his neuropathy.   Remaining deficits: Impaired balance due to neuropathy and LE weakness   Education / Equipment: HEP  Plan: Patient agrees to discharge.  Patient goals were partially met. Patient is being discharged due to being pleased with the current functional level.  ?????      Barry Brunner, PT Outpatient Neurorehabilitation [address], [place], [place] [address] [phone]

## 2017-01-25 NOTE — Therapy (Signed)
[facility] [address] [place], [place], [address] Phone: [phone]   Fax:  [phone]  Physical Therapy Treatment  Patient Details  Name: Travis Huff MRN: [id number] Date of Birth: January 09, 1960 Referring Provider: Erline Levine MD   Encounter Date: 01/23/2017    01/23/17 1624  PT Visits / Re-Eval  Visit Number 12  Number of Visits 17  Date for PT Re-Evaluation 01/27/17  Authorization  Authorization Type UMR  PT Time Calculation  PT Start Time 1620  PT Stop Time 1659  PT Time Calculation (min) 39 min  PT - End of Session  Equipment Utilized During Treatment Gait belt  Activity Tolerance Patient tolerated treatment well;No increased pain  Behavior During Therapy WFL for tasks assessed/performed     Past Medical History:  Diagnosis Date  . Arthritis   . Asthma   . GERD (gastroesophageal reflux disease)    uses tums  . Hypertension   . Viral pericarditis     Past Surgical History:  Procedure Laterality Date  . ABDOMINAL EXPOSURE N/A 06/23/2016   Procedure: ABDOMINAL EXPOSURE;  Surgeon: Rosetta Posner, MD;  Location: [facility];  Service: Vascular;  Laterality: N/A;  . ANTERIOR LATERAL LUMBAR FUSION 4 LEVELS Right 06/23/2016   Procedure: Right  Lumbar two-three Lumbar three-four Lumbar four-five Anterolateral lumbar interbody fusion;  Surgeon: Erline Levine, MD;  Location: [facility];  Service: Neurosurgery;  Laterality: Right;  . ANTERIOR LUMBAR FUSION N/A 06/23/2016   Procedure: Lumbar five -sacral one  Anterior lumbar interbody fusion with Dr. Sherren Mocha Early for approach;  Surgeon: Erline Levine, MD;  Location: [facility];  Service: Neurosurgery;  Laterality: N/A;  . APPLICATION OF INTRAOPERATIVE CT SCAN N/A 06/26/2016   Procedure: APPLICATION OF INTRAOPERATIVE CT SCAN;  Surgeon: Erline Levine, MD;  Location: [facility];  Service: Neurosurgery;  Laterality: N/A;  . CARDIOVASCULAR STRESS TEST     Negative in May 2014  .  COLONOSCOPY    . HERNIA REPAIR    . JOINT REPLACEMENT    . KNEE SURGERY    . POSTERIOR LUMBAR FUSION 4 LEVEL N/A 06/26/2016   Procedure: Thoracic ten to Pelvis fixation with AIRO;  Surgeon: Erline Levine, MD;  Location: [facility];  Service: Neurosurgery;  Laterality: N/A;  . TENDON REPAIR     right hand  . TOTAL HIP ARTHROPLASTY Right 05/25/2014   Procedure: RIGHT TOTAL HIP ARTHROPLASTY ANTERIOR APPROACH;  Surgeon: Rod Can, MD;  Location: [facility];  Service: Orthopedics;  Laterality: Right;  . TOTAL KNEE ARTHROPLASTY Bilateral     There were no vitals filed for this visit.      01/23/17 1622  Symptoms/Limitations  Subjective No new episodes of the LE numbness with driving. No falls to report. HEP is still challenging.   Pertinent History anterior lumbar surgery with fusion T10 to pelvis 06/2016; post-op DVT and PE; rt posterior parietal infarct (undetermined age); OA; bil TKR; Rt THR; obesity  How long can you stand comfortably? stood for 2 hours on Sunday (big improvement since back surgery)  Pain Assessment  Currently in Pain? Yes  Pain Score 2  Pain Location Back  Pain Orientation Lower  Pain Descriptors / Indicators Sore  Pain Type Acute pain  Pain Onset Today  Pain Frequency Intermittent  Aggravating Factors  poor positioning at work today  Pain Relieving Factors did not take anything as does not feel is warrented.       01 /22/19 1632  Balance Exercises: Standing  Standing Eyes  Closed Narrow base of support (BOS);Head turns;Foam/compliant surface;Other reps (comment);30 secs;Limitations  Rockerboard Anterior/posterior;Lateral;EO;EC;30 seconds;10 reps;Intermittent UE support  Balance Exercises: Standing  Standing Eyes Closed Limitations doubled blue foam in corner with chair in front for safety:  Wall Bumps Limitations with feet across blue beam: EO progressing to [facility] for 10 reps each with min guard assist. cues for knee extension and full upright posture on return.    Rebounder Limitations performed both ways on balance board: EO rocking board with emphasis on tall posture; holding board steady: EC no head movements, progressing to EC head movements left<>right, up<>down and diagonals both ways. min to mod assist with mostly posterior loss of balance. cues posture, stance position and weight shifting to assist with balance recovery. no UE support with occasional touch to bars for balance correction.        01/23/17 1627  Transfers  Transfers Sit to Stand;Stand to Sit  Sit to Stand 6: Modified independent (Device/Increase time)  Five time sit to stand comments  10.94 sec's no UE support (arms across chest) using standard height chair  Stand to Sit 6: Modified independent (Device/Increase time)      PT Short Term Goals - 01/03/17 1538      PT SHORT TERM GOAL #1   Title  Patient will be independent with HEP to address balance deficits. (Target date for all STGs 12/28/16; date extended to 01/04/17)    Baseline  01/03/17: met today except for corner balance which was reissued today    Status  Achieved      PT SHORT TERM GOAL #2   Title  Patient will improve bil LE strength and balance as evidenced by decreased 5x sit to stand time to <12 seconds.    Baseline  11/28/16  15.9 sec no UE support, improved from eval, just not to goal    Time  --    Period  --    Status  Partially Met      PT SHORT TERM GOAL #3   Title  Patient will improve DGI to >16 to demonstrate improved balance.    Baseline  01/03/17: 18/24 scored today     Time  --    Period  --    Status  Achieved      PT SHORT TERM GOAL #4   Title  Patient will verbalize understanding of fall prevention education related to improving safety at home.    Baseline  01/03/17: met today    Time  --    Period  --    Status  Achieved        PT Long Term Goals - 01/23/17 1624      PT LONG TERM GOAL #1   Title  Patient will be independent with updated HEP addressing balance deficits (Target for all  LTGs 01/28/16)    Baseline  01/23/17: doing them consistently with more focus on balance ones. still challenging.     Status  Achieved      PT LONG TERM GOAL #2   Title  Patient will improve 5x sit to stand to <10 sec as indication of improved LE strength and balance.    Baseline  01/23/17: 10.94 sec's with no UE support using standard height chair    Time  --    Period  --    Status  Partially Met      PT LONG TERM GOAL #3   Title  Patient will improve DGI score to >=19/24 to indicate progress  towards lesser fall risk.    Time  8    Period  Weeks    Status  New      PT LONG TERM GOAL #4   Title  Patient will verbalize a plan for community-based activity upon discharge from PT.     Baseline  01/23/17: pt plans to check into [employer] near his work in [place].    Status  Achieved         01/23/17 1624  Plan  Clinical Impression Statement Today's skilled session continued to address balance and began to address LTGs. Anticipate discharge at next visit with pt in agreement.   Pt will benefit from skilled therapeutic intervention in order to improve on the following deficits Abnormal gait;Decreased balance;Decreased mobility;Decreased knowledge of use of DME;Decreased coordination;Impaired flexibility;Impaired sensation;Postural dysfunction;Obesity  Rehab Potential Good  Clinical Impairments Affecting Rehab Potential obesity, neuropathy, ?vestibular deficit  PT Frequency 2x / week  PT Duration 8 weeks  PT Treatment/Interventions ADLs/Self Care Home Management;Gait training;DME Instruction;Stair training;Functional mobility training;Therapeutic activities;Therapeutic exercise;Balance training;Neuromuscular re-education;Patient/family education;Passive range of motion;Vestibular;Visual/perceptual remediation/compensation  PT Next Visit Plan check remaining LTGs for anticipated discharge  Consulted and Agree with Plan of Care Patient        Patient will benefit from skilled  therapeutic intervention in order to improve the following deficits and impairments:  Abnormal gait, Decreased balance, Decreased mobility, Decreased knowledge of use of DME, Decreased coordination, Impaired flexibility, Impaired sensation, Postural dysfunction, Obesity  Visit Diagnosis: Unsteadiness on feet  Other abnormalities of gait and mobility  Dizziness and giddiness     Problem List Patient Active Problem List   Diagnosis Date Noted  . Nondisplaced fracture of distal phalanx of left great toe, initial encounter for closed fracture 11/30/2016  . Open dislocation of left second PIP of the foot 11/29/2016  . Scoliosis of thoracolumbar spine 06/23/2016  . Headache 06/20/2016  . Anxiety, generalized 05/11/2016  . Primary osteoarthritis of right hip 05/25/2014  . Benign essential hypertension 03/26/2014  . Spinal stenosis, lumbar region, with neurogenic claudication 12/21/2013  . Osteoarthritis of right hip 07/07/2013  . Morbid obesity ([place]) 08/01/2012  . Precordial pain 05/24/2012  . S/P total knee arthroplasty 01/26/2012  . Erectile dysfunction 01/26/2012  . Preventive measure 01/26/2012    Willow Ora, PTA, [facility] [address] [place], [place] [address] [phone] 01/25/17, 8:36 AM   Name: Travis Huff MRN: [id number] Date of Birth: 09/01/1960

## 2017-01-25 NOTE — Patient Instructions (Addendum)
"  I love a Database administrator   At counter for balance as needed: high knee marching forward and then backward. 3 second pauses with each knee lift.  Repeat 3 laps each way. Do _1-2_ sessions per day. http://gt2.exer.us/345   Copyright  VHI. All rights reserved.  Side-Stepping   At counter for balance as needed: Walk to left side with eyes open. Take even steps, leading with same foot. Make sure each foot lifts off the floor. Repeat in opposite direction. Keep feet pointed toward counter. Repeat for 3 laps each way.  Do _1-2__ sessions per day.  Copyright  VHI. All rights reserved.  Feet Heel-Toe "Tandem"   At counter: Arms at sides, walk a straight line forward bringing one foot directly in front of the other, and then a straight line backwards bringing one foot directly behind the other one.  Repeat for _3 laps each way. Do _1-2_ sessions per day.  Copyright  VHI. All rights reserved.  Braiding   At counter for  Balance as needed: walking sideways with following pattern "front, step, back, step" along counter, repeat the pattern the other way to start position. Repeat 3 laps each direction. Do __1-2__ sessions per day.  Copyright  VHI. All rights reserved.

## 2017-02-07 DIAGNOSIS — M5416 Radiculopathy, lumbar region: Secondary | ICD-10-CM | POA: Diagnosis not present

## 2017-02-07 DIAGNOSIS — I69993 Ataxia following unspecified cerebrovascular disease: Secondary | ICD-10-CM | POA: Diagnosis not present

## 2017-02-07 DIAGNOSIS — I10 Essential (primary) hypertension: Secondary | ICD-10-CM | POA: Diagnosis not present

## 2017-02-07 DIAGNOSIS — M419 Scoliosis, unspecified: Secondary | ICD-10-CM | POA: Diagnosis not present

## 2017-02-07 DIAGNOSIS — R2689 Other abnormalities of gait and mobility: Secondary | ICD-10-CM | POA: Diagnosis not present

## 2017-02-07 DIAGNOSIS — M545 Low back pain: Secondary | ICD-10-CM | POA: Diagnosis not present

## 2017-02-14 ENCOUNTER — Encounter: Payer: Self-pay | Admitting: Neurology

## 2017-02-15 MED FILL — SILDENAFIL CITRATE 100 MG T: 100 | 30 days supply | Qty: 6 | Fill #7

## 2017-02-15 MED FILL — BYSTOLIC 10 MG TABLET: 10 | 90 days supply | Qty: 90 | Fill #1

## 2017-02-26 MED FILL — GABAPENTIN 300 MG CAPSULE: 300 | 30 days supply | Qty: 90 | Fill #0

## 2017-03-21 MED FILL — CYCLOBENZAPRINE HCL 10 MG T: 10 | 30 days supply | Qty: 90 | Fill #3

## 2017-04-17 ENCOUNTER — Ambulatory Visit (INDEPENDENT_AMBULATORY_CARE_PROVIDER_SITE_OTHER): Payer: 59 | Admitting: Sports Medicine

## 2017-04-17 ENCOUNTER — Encounter: Payer: Self-pay | Admitting: Sports Medicine

## 2017-04-17 DIAGNOSIS — I10 Essential (primary) hypertension: Secondary | ICD-10-CM | POA: Diagnosis not present

## 2017-04-17 DIAGNOSIS — Z299 Encounter for prophylactic measures, unspecified: Secondary | ICD-10-CM

## 2017-04-17 DIAGNOSIS — R748 Abnormal levels of other serum enzymes: Secondary | ICD-10-CM

## 2017-04-17 DIAGNOSIS — E782 Mixed hyperlipidemia: Secondary | ICD-10-CM | POA: Diagnosis not present

## 2017-04-17 DIAGNOSIS — M48062 Spinal stenosis, lumbar region with neurogenic claudication: Secondary | ICD-10-CM

## 2017-04-17 NOTE — Assessment & Plan Note (Addendum)
Doing well post lumbar fusion, very little to no axial pain. Persistent cramping at night, lower extremity is, as well as numbness in the bottom of his feet. Although I do think this is secondary to prolonged severe lumbar spinal stenosis we are going to do a serum workup, he does have an appointment coming up with neurology, and I do suspect he will get a nerve conduction/EMG.  Vitamin B12 levels just populated in, they are in the low limit of normal, adding methylmalonic acid levels.  These call lab to add this on.

## 2017-04-17 NOTE — Assessment & Plan Note (Signed)
Checking routine labs 

## 2017-04-17 NOTE — Progress Notes (Addendum)
Subjective:    CC: Follow-up  HPI: Hypertension: Not too bad today, had a few cups of coffee.  No headaches, visual changes, chest pain.  Paresthesias: Bilateral, lower extremity is with cramping at night.  History of lumbar fusion, currently co-managed with neurosurgery, he did have some gait abnormality, back pain is gone, he is almost 1 year post lumbar fusion, but still has the persistent paresthesias in the leg.  I do not see that he has had a serum workup yet, he does have a appointment coming up with neurology, likely for nerve conduction/EMG.  Paresthesias are not in any particular distribution, mostly the bottom of the feet.  I reviewed the past medical history, family history, social history, surgical history, and allergies today and no changes were needed.  Please see the problem list section below in epic for further details.  Past Medical History: Past Medical History:  Diagnosis Date  . Arthritis   . Asthma   . GERD (gastroesophageal reflux disease)    uses tums  . Hypertension   . Viral pericarditis    Past Surgical History: Past Surgical History:  Procedure Laterality Date  . ABDOMINAL EXPOSURE N/A 06/23/2016   Procedure: ABDOMINAL EXPOSURE;  Surgeon: Rosetta Posner, MD;  Location: [facility];  Service: Vascular;  Laterality: N/A;  . ANTERIOR LATERAL LUMBAR FUSION 4 LEVELS Right 06/23/2016   Procedure: Right  Lumbar two-three Lumbar three-four Lumbar four-five Anterolateral lumbar interbody fusion;  Surgeon: Erline Levine, MD;  Location: [facility];  Service: Neurosurgery;  Laterality: Right;  . ANTERIOR LUMBAR FUSION N/A 06/23/2016   Procedure: Lumbar five -sacral one  Anterior lumbar interbody fusion with Dr. Sherren Mocha Early for approach;  Surgeon: Erline Levine, MD;  Location: [facility];  Service: Neurosurgery;  Laterality: N/A;  . APPLICATION OF INTRAOPERATIVE CT SCAN N/A 06/26/2016   Procedure: APPLICATION OF INTRAOPERATIVE CT SCAN;  Surgeon: Erline Levine, MD;  Location: [facility];   Service: Neurosurgery;  Laterality: N/A;  . CARDIOVASCULAR STRESS TEST     Negative in May 2014  . COLONOSCOPY    . HERNIA REPAIR    . JOINT REPLACEMENT    . KNEE SURGERY    . POSTERIOR LUMBAR FUSION 4 LEVEL N/A 06/26/2016   Procedure: Thoracic ten to Pelvis fixation with AIRO;  Surgeon: Erline Levine, MD;  Location: [facility];  Service: Neurosurgery;  Laterality: N/A;  . TENDON REPAIR     right hand  . TOTAL HIP ARTHROPLASTY Right 05/25/2014   Procedure: RIGHT TOTAL HIP ARTHROPLASTY ANTERIOR APPROACH;  Surgeon: Rod Can, MD;  Location: [facility];  Service: Orthopedics;  Laterality: Right;  . TOTAL KNEE ARTHROPLASTY Bilateral    Social History: Social History   Socioeconomic History  . Marital status: Married    Spouse name: Not on file  . Number of children: Not on file  . Years of education: Not on file  . Highest education level: Not on file  Occupational History  . Not on file  Social Needs  . Financial resource strain: Not on file  . Food insecurity:    Worry: Not on file    Inability: Not on file  . Transportation needs:    Medical: Not on file    Non-medical: Not on file  Tobacco Use  . Smoking status: Former Smoker    Packs/day: 1.00    Years: 25.00    Pack years: 25.00    Types: Cigarettes    Last attempt to quit: 2000    Years since quitting:  19.3  . Smokeless tobacco: Current User    Types: Snuff  Substance and Sexual Activity  . Alcohol use: Yes    Alcohol/week: 8.4 oz    Types: 14 Cans of beer per week    Comment: 2 beers QD  . Drug use: No  . Sexual activity: Not on file  Lifestyle  . Physical activity:    Days per week: Not on file    Minutes per session: Not on file  . Stress: Not on file  Relationships  . Social connections:    Talks on phone: Not on file    Gets together: Not on file    Attends religious service: Not on file    Active member of club or organization: Not on file    Attends meetings of clubs or organizations: Not on file     Relationship status: Not on file  Other Topics Concern  . Not on file  Social History Narrative  . Not on file   Family History: Family History  Adopted: Yes   Allergies: Allergies  Allergen Reactions  . Dilaudid [Hydromorphone Hcl] Itching and Other (See Comments)    Couldn't sleep  . Morphine And Related Itching   Medications: See med rec.  Review of Systems: No fevers, chills, night sweats, weight loss, chest pain, or shortness of breath.   Objective:    General: Well Developed, well nourished, and in no acute distress.  Neuro: Alert and oriented x3, extra-ocular muscles intact, sensation grossly intact.  HEENT: Normocephalic, atraumatic, pupils equal round reactive to light, neck supple, no masses, no lymphadenopathy, thyroid nonpalpable.  Skin: Warm and dry, no rashes. Cardiac: Regular rate and rhythm, no murmurs rubs or gallops, no lower extremity edema.  Respiratory: Clear to auscultation bilaterally. Not using accessory muscles, speaking in full sentences.  Impression and Recommendations:    Benign essential hypertension Checking routine labs.  Preventive measure Adding hepatitis C and HIV screening. At the follow-up visit we will discuss colon cancer screening.  Spinal stenosis, lumbar region, with neurogenic claudication Doing well post lumbar fusion, very little to no axial pain. Persistent cramping at night, lower extremity is, as well as numbness in the bottom of his feet. Although I do think this is secondary to prolonged severe lumbar spinal stenosis we are going to do a serum workup, he does have an appointment coming up with neurology, and I do suspect he will get a nerve conduction/EMG.  Vitamin B12 levels just populated in, they are in the low limit of normal, adding methylmalonic acid levels.  These call lab to add this on.  Morbid obesity Checking labs, we will start weight loss medication if no reversible causes identified in his blood  work.  Elevated CK Question myopathy, nerve conduction/EMG coming up with neurology.  I spent 25 minutes with this patient, greater than 50% was face-to-face time counseling regarding the above diagnoses ___________________________________________ Gwen Her. Dianah Field, M.D., ABFM., CAQSM. Primary Care and [place] [employer] [facility]

## 2017-04-17 NOTE — Assessment & Plan Note (Signed)
Checking labs, we will start weight loss medication if no reversible causes identified in his blood work.

## 2017-04-17 NOTE — Assessment & Plan Note (Signed)
Adding hepatitis C and HIV screening. At the follow-up visit we will discuss colon cancer screening.

## 2017-04-18 ENCOUNTER — Other Ambulatory Visit: Payer: Self-pay | Admitting: Sports Medicine

## 2017-04-18 DIAGNOSIS — R748 Abnormal levels of other serum enzymes: Secondary | ICD-10-CM | POA: Insufficient documentation

## 2017-04-18 DIAGNOSIS — E782 Mixed hyperlipidemia: Secondary | ICD-10-CM | POA: Insufficient documentation

## 2017-04-18 MED FILL — DULoxetine HCL 60 MG CPEP: 60 | 30 days supply | Qty: 30 | Fill #1

## 2017-04-18 MED FILL — SILDENAFIL CITRATE 100 MG T: 100 | 90 days supply | Qty: 18 | Fill #0

## 2017-04-18 NOTE — Assessment & Plan Note (Signed)
Question myopathy, nerve conduction/EMG coming up with neurology.

## 2017-04-19 NOTE — Addendum Note (Signed)
Addended by: Silverio Decamp on: 04/19/2017 11:54 AM   Modules accepted: Orders

## 2017-04-21 LAB — TSH: TSH: 1.14 mIU/L (ref 0.40–4.50)

## 2017-04-21 LAB — LIPID PANEL W/REFLEX DIRECT LDL
Cholesterol: 229 mg/dL — ABNORMAL HIGH (ref <200)
HDL: 66 mg/dL (ref >40)
LDL Cholesterol (Calc): 140 mg/dL (calc) — ABNORMAL HIGH
Non-HDL Cholesterol (Calc): 163 mg/dL (calc) — ABNORMAL HIGH (ref <130)
Total CHOL/HDL Ratio: 3.5 (calc) (ref <5.0)
Triglycerides: 113 mg/dL (ref <150)

## 2017-04-21 LAB — COMPREHENSIVE METABOLIC PANEL
AG Ratio: 1.4 (calc) (ref 1.0–2.5)
ALT: 30 U/L (ref 9–46)
AST: 39 U/L — ABNORMAL HIGH (ref 10–35)
Albumin: 4.5 g/dL (ref 3.6–5.1)
Alkaline phosphatase (APISO): 119 U/L — ABNORMAL HIGH (ref 40–115)
BUN: 18 mg/dL (ref 7–25)
CO2: 25 mmol/L (ref 20–32)
Calcium: 10.1 mg/dL (ref 8.6–10.3)
Chloride: 99 mmol/L (ref 98–110)
Creat: 1.06 mg/dL (ref 0.70–1.33)
Globulin: 3.2 g/dL (calc) (ref 1.9–3.7)
Glucose, Bld: 92 mg/dL (ref 65–139)
Potassium: 4.1 mmol/L (ref 3.5–5.3)
Sodium: 136 mmol/L (ref 135–146)
Total Bilirubin: 0.9 mg/dL (ref 0.2–1.2)
Total Protein: 7.7 g/dL (ref 6.1–8.1)

## 2017-04-21 LAB — PROTEIN ELECTROPHORESIS, SERUM, WITH REFLEX
Albumin ELP: 4.4 g/dL (ref 3.8–4.8)
Alpha 1: 0.3 g/dL (ref 0.2–0.3)
Alpha 2: 0.7 g/dL (ref 0.5–0.9)
Beta 2: 0.4 g/dL (ref 0.2–0.5)
Beta Globulin: 0.5 g/dL (ref 0.4–0.6)
Gamma Globulin: 1.1 g/dL (ref 0.8–1.7)
Total Protein: 7.4 g/dL (ref 6.1–8.1)

## 2017-04-21 LAB — VITAMIN B12: Vitamin B-12: 332 pg/mL (ref 200–1100)

## 2017-04-21 LAB — HEAVY METALS PANEL, BLOOD
Arsenic: <10 mcg/L (ref <23)
Lead: 2 ug/dL (ref <5)
Mercury, B: <5 mcg/L (ref 0–10)

## 2017-04-21 LAB — HEPATITIS C ANTIBODY
Hepatitis C Ab: NONREACTIVE
SIGNAL TO CUT-OFF: 0.01 (ref <1.00)

## 2017-04-21 LAB — CBC
HCT: 48.4 % (ref 38.5–50.0)
Hemoglobin: 16.6 g/dL (ref 13.2–17.1)
MCH: 29.9 pg (ref 27.0–33.0)
MCHC: 34.3 g/dL (ref 32.0–36.0)
MCV: 87.1 fL (ref 80.0–100.0)
MPV: 10.8 fL (ref 7.5–12.5)
Platelets: 236 10*3/uL (ref 140–400)
RBC: 5.56 10*6/uL (ref 4.20–5.80)
RDW: 13.8 % (ref 11.0–15.0)
WBC: 9.8 10*3/uL (ref 3.8–10.8)

## 2017-04-21 LAB — HEMOGLOBIN A1C
Hgb A1c MFr Bld: 5.9 % of total Hgb — ABNORMAL HIGH (ref <5.7)
Mean Plasma Glucose: 123 (calc)
eAG (mmol/L): 6.8 (calc)

## 2017-04-21 LAB — HIV ANTIBODY (ROUTINE TESTING W REFLEX): HIV 1&2 Ab, 4th Generation: NONREACTIVE

## 2017-04-21 LAB — METHYLMALONIC ACID, SERUM: Methylmalonic Acid, Quant: 181 nmol/L (ref 87–318)

## 2017-04-21 LAB — MAGNESIUM: Magnesium: 1.9 mg/dL (ref 1.5–2.5)

## 2017-04-21 LAB — CK: Total CK: 745 U/L — ABNORMAL HIGH (ref 44–196)

## 2017-04-26 MED FILL — LISINOPRIL-HCTZ 20-25 MG TA: 20-25 | 90 days supply | Qty: 90 | Fill #3

## 2017-05-04 ENCOUNTER — Encounter: Payer: Self-pay | Admitting: Neurology

## 2017-05-04 ENCOUNTER — Ambulatory Visit: Payer: 59 | Admitting: Neurology

## 2017-05-04 VITALS — BP 120/86 | HR 97 | Ht 72.0 in | Wt 306.5 lb

## 2017-05-04 DIAGNOSIS — M48062 Spinal stenosis, lumbar region with neurogenic claudication: Secondary | ICD-10-CM

## 2017-05-04 DIAGNOSIS — G629 Polyneuropathy, unspecified: Secondary | ICD-10-CM

## 2017-05-04 DIAGNOSIS — R278 Other lack of coordination: Secondary | ICD-10-CM | POA: Diagnosis not present

## 2017-05-04 DIAGNOSIS — R2681 Unsteadiness on feet: Secondary | ICD-10-CM | POA: Diagnosis not present

## 2017-05-04 NOTE — Progress Notes (Signed)
[facility] Clinic Note - Initial Visit   Date: 05/04/17  Travis Huff MRN: [id number] DOB: 1960-05-18   Dear Dr. [name]:  Thank you for your kind referral of Travis Huff for consultation of bilateral leg numbness. Although his history is well known to you, please allow [place] to reiterate it for the purpose of our medical record. The patient was accompanied to the clinic by wife who also provides collateral information.     History of Present Illness: Travis Huff is a 57 y.o. right-handed Caucasian male with asthma, GERD, hypertension, prediabetes, h/o of DVT and PE and s/p lumbar fusion with T10 - pelvis fixation (2018) presenting for evaluation of bilateral leg numbness.    For the past 5 years, he has numbness involving his waist down into his legs especially with standing. It first started in his left foot and would move up his left leg, across his back, and down into his right thigh and foot.  Later, he began having severe low back pain, cramps, imbalance, and fatigue of the legs. MRI lumbar spine in 2015 showed multilevel severe canal and foraminal stenosis and convex scoliosis.  His back surgery was delayed due to him needing right hip surgery.  In June 2018, he underwent extensive lumbar fusion at all the lumbar levels with thoracic to pelvis fixation by Dr. [name].  He has significant improvement in back pain following surgery and was able to walk somewhat better.   He has completed physical therapy.   He no longer has numbness involving the thighs or waist. However, he has constant numbness over the lower legs and feet. His numbness can be so intense that sometimes, he is unable to feel the gas pedals and has to use a flash light to actually see where to place his feet. He gets a lot of cramps and feels that it makes the numbness worse.  He has imbalance and falls about once per month and walks with a cane.  He was taking gapapentin and self-tapered  himself because of no effect.  He drinks (2) 16oz beer nightly for 30+ years.    There is no of old right parietal infarct, age unknown.    Out-side paper records, electronic medical record, and images have been reviewed where available and summarized as:  MRI brain wwo contrast 06/20/2016: Mild atrophy.  Remote medial RIGHT posterior parietal infarct. No acute intracranial findings. No abnormal postcontrast enhancement. No cause is seen for the reported symptoms.  MRI lumbar spine wo contrast 06/20/2016:   1. Progressive multilevel degenerative spondylolysis and facet arthropathy as compared to most recent MRI from 12/21/2013. 2. Degenerative disc bulge with advanced facet arthropathy with resultant multilevel canal and lateral recess narrowing as above, severe at the L4-5 level. 3. Multifactorial degenerative changes as above with resultant severe foraminal stenosis on the right at L1-2, and on the left at L4-5 and L5-S1. 4. Advanced multilevel facet arthropathy, most notable at L3-4 through L5-S1. Associated reactive edema about the right L3-4 facet.  5. Levoscoliosis with apex at L2-3.     Past Medical History:  Diagnosis Date  . Arthritis   . Asthma   . GERD (gastroesophageal reflux disease)    uses tums  . Hypertension   . Viral pericarditis     Past Surgical History:  Procedure Laterality Date  . ABDOMINAL EXPOSURE N/A 06/23/2016   Procedure: ABDOMINAL EXPOSURE;  Surgeon: Rosetta Posner, MD;  Location: [place];  Service: Vascular;  Laterality:  N/A;  . ANTERIOR LATERAL LUMBAR FUSION 4 LEVELS Right 06/23/2016   Procedure: Right  Lumbar two-three Lumbar three-four Lumbar four-five Anterolateral lumbar interbody fusion;  Surgeon: Erline Levine, MD;  Location: [facility];  Service: Neurosurgery;  Laterality: Right;  . ANTERIOR LUMBAR FUSION N/A 06/23/2016   Procedure: Lumbar five -sacral one  Anterior lumbar interbody fusion with Dr. Sherren Mocha Early for approach;  Surgeon: Erline Levine,  MD;  Location: [place];  Service: Neurosurgery;  Laterality: N/A;  . APPLICATION OF INTRAOPERATIVE CT SCAN N/A 06/26/2016   Procedure: APPLICATION OF INTRAOPERATIVE CT SCAN;  Surgeon: Erline Levine, MD;  Location: [place];  Service: Neurosurgery;  Laterality: N/A;  . CARDIOVASCULAR STRESS TEST     Negative in May 2014  . COLONOSCOPY    . HERNIA REPAIR    . JOINT REPLACEMENT    . KNEE SURGERY    . POSTERIOR LUMBAR FUSION 4 LEVEL N/A 06/26/2016   Procedure: Thoracic ten to Pelvis fixation with AIRO;  Surgeon: Erline Levine, MD;  Location: [facility];  Service: Neurosurgery;  Laterality: N/A;  . TENDON REPAIR     right hand  . TOTAL HIP ARTHROPLASTY Right 05/25/2014   Procedure: RIGHT TOTAL HIP ARTHROPLASTY ANTERIOR APPROACH;  Surgeon: Rod Can, MD;  Location: [facility];  Service: Orthopedics;  Laterality: Right;  . TOTAL KNEE ARTHROPLASTY Bilateral      Medications:  Outpatient Encounter Medications as of 05/04/2017  Medication Sig Note  . BYSTOLIC 10 MG tablet TAKE 1 TABLET (10 MG TOTAL) BY MOUTH DAILY.   . calcium carbonate (TUMS - DOSED IN MG ELEMENTAL CALCIUM) 500 MG chewable tablet Chew 3-4 tablets by mouth at bedtime as needed (stomach cramps).   . cyclobenzaprine (FLEXERIL) 10 MG tablet Take 1 tablet (10 mg total) by mouth 3 (three) times daily as needed for muscle spasms. (Patient taking differently: Take 10 mg by mouth at bedtime. )   . docusate sodium (COLACE) 100 MG capsule Take 1 capsule (100 mg total) by mouth 2 (two) times daily. 06/06/2016: Plans to start back up after procedure prn  . DULoxetine 40 MG CPEP Take 40 mg by mouth daily. 06/30/2016: Wife states patient will start taking 20 mg today due to increase headaches and will follow up with MD.  . gabapentin (NEURONTIN) 300 MG capsule    . lisinopril-hydrochlorothiazide (PRINZIDE,ZESTORETIC) 20-25 MG tablet TAKE 1 TABLET BY MOUTH DAILY.   [name] LYRICA 75 MG capsule    . sildenafil (VIAGRA) 100 MG tablet TAKE 1 TABLET BY MOUTH AS NEEDED FOR  ERECTILE DYSFUNCTION *MAX DOSE IS 1 TABLET PER DAY*   . [DISCONTINUED] HYDROmorphone (DILAUDID) 2 MG tablet Take 2 mg by mouth every 4 (four) hours as needed for severe pain.   . [DISCONTINUED] Magnesium 500 MG CAPS Take 500 mg by mouth at bedtime.   . [DISCONTINUED] MAGNESIUM PO Take 1 tablet by mouth daily. 06/30/2016: This is a duplicate.   . [DISCONTINUED] OVER THE COUNTER MEDICATION Apply 1 application topically daily as needed (muscle pain). Theraworx otc muscle rub   . [DISCONTINUED] oxyCODONE-acetaminophen (PERCOCET) 10-325 MG tablet Take 1 tablet by mouth every 8 (eight) hours as needed for pain. (Patient not taking: Reported on 12/12/2016)   . [DISCONTINUED] potassium bicarbonate (K-LYTE) 25 MEQ disintegrating tablet Take 25 mEq by mouth 2 (two) times daily. 06/23/2016: Does not take this potassium   . [DISCONTINUED] POTASSIUM PO Take 1 tablet by mouth daily.   . [DISCONTINUED] rivaroxaban (XARELTO) 20 MG TABS tablet Take 1 tablet (20  mg total) by mouth daily with supper.   . [DISCONTINUED] Rivaroxaban 15 & 20 MG TBPK Take as directed on package: Start with one 15mg  tablet by mouth twice a day with food. On Day 22, switch to one 20mg  tablet once a day with food.    No facility-administered encounter medications on file as of 05/04/2017.      Allergies:  Allergies  Allergen Reactions  . Dilaudid [Hydromorphone Hcl] Itching and Other (See Comments)    Couldn't sleep  . Morphine And Related Itching    Family History: Family History  Adopted: Yes  Problem Relation Age of Onset  . Healthy Daughter     Social History: Social History   Tobacco Use  . Smoking status: Former Smoker    Packs/day: 1.00    Years: 25.00    Pack years: 25.00    Types: Cigarettes    Last attempt to quit: 2000    Years since quitting: 19.3  . Smokeless tobacco: Current User    Types: Snuff  Substance Use Topics  . Alcohol use: Yes    Alcohol/week: 8.4 oz    Types: 14 Cans of beer per week     Comment: 2 beers QD  . Drug use: No   Social History   Social History Narrative   Lives with wife in a one story home.  Has 2 children.  Works as a Public relations account executive.  Education: high school.     Review of Systems:  CONSTITUTIONAL: No fevers, chills, night sweats, or weight loss.   EYES: No visual changes or eye pain ENT: No hearing changes.  No history of nose bleeds.   RESPIRATORY: No cough, wheezing and shortness of breath.   CARDIOVASCULAR: Negative for chest pain, and palpitations.   GI: Negative for abdominal discomfort, blood in stools or black stools.  No recent change in bowel habits.   GU:  No history of incontinence.   MUSCLOSKELETAL: +history of joint pain or swelling.  No myalgias.   SKIN: Negative for lesions, rash, and itching.   HEMATOLOGY/ONCOLOGY: Negative for prolonged bleeding, bruising easily, and swollen nodes.  No history of cancer.   ENDOCRINE: Negative for cold or heat intolerance, polydipsia or goiter.   PSYCH:  No depression +anxiety symptoms.   NEURO: As Above.   Vital Signs:  BP 120/86   Pulse 97   Ht 6' (1.829 m)   Wt (!) 306 lb 8 oz (139 kg)   SpO2 96%   BMI 41.57 kg/m    General Medical Exam:   General:  Well appearing, comfortable, anxious appearing.   Eyes/ENT: see cranial nerve examination.   Neck: No masses appreciated.  Full range of motion without tenderness.  No carotid bruits. Respiratory:  Clear to auscultation, good air entry bilaterally.   Cardiac:  Regular rate and rhythm, no murmur.   Extremities:  No deformities, edema, or skin discoloration.  Skin:  No rashes or lesions.  Neurological Exam: MENTAL STATUS including orientation to time, place, person, recent and remote memory, attention span and concentration, language, and fund of knowledge is normal.  Speech is not dysarthric.  CRANIAL NERVES: II:  No visual field defects.  Unremarkable fundi.   III-IV-VI: Pupils equal round and reactive to light.  Normal conjugate,  extra-ocular eye movements in all directions of gaze.  No nystagmus.  No ptosis.   V:  Normal facial sensation.     VII:  Normal facial symmetry and movements.   VIII:  Normal hearing and  vestibular function.   IX-X:  Normal palatal movement.   XI:  Normal shoulder shrug and head rotation.   XII:  Normal tongue strength and range of motion, no deviation or fasciculation.  MOTOR:  No atrophy, fasciculations or abnormal movements.  No pronator drift.  Tone is normal.    Right Upper Extremity:    Left Upper Extremity:    Deltoid  5/5   Deltoid  5/5   Biceps  5/5   Biceps  5/5   Triceps  5/5   Triceps  5/5   Wrist extensors  5/5   Wrist extensors  5/5   Wrist flexors  5/5   Wrist flexors  5/5   Finger extensors  5/5   Finger extensors  5/5   Finger flexors  5/5   Finger flexors  5/5   Dorsal interossei  5/5   Dorsal interossei  5/5   Abductor pollicis  5/5   Abductor pollicis  5/5   Tone (Ashworth scale)  0  Tone (Ashworth scale)  0   Right Lower Extremity:    Left Lower Extremity:    Hip flexors  5/5   Hip flexors  5/5   Hip extensors  5/5   Hip extensors  5/5   Knee flexors  5/5   Knee flexors  5/5   Knee extensors  5/5   Knee extensors  5/5   Dorsiflexors  5/5   Dorsiflexors  5/5   Plantarflexors  5/5   Plantarflexors  5/5   Toe extensors  5-/5   Toe extensors  5-/5   Toe flexors  5-/5   Toe flexors  5-/5   Tone (Ashworth scale)  0  Tone (Ashworth scale)  0   MSRs:  Right                                                                 Left brachioradialis 1+  brachioradialis 1+  biceps 1+  biceps 1+  triceps 1+  triceps 1+  patellar 1+  patellar 1+  ankle jerk 0  ankle jerk 0  Hoffman no  Hoffman no  plantar response down  plantar response down   SENSORY:  Vibration is reduced to 75% at the ankles, absent at the great toe.  Pin prick and temperature absent distal to ankles bilaterally.  Proprioception is impaired at the great toe.  Prominent sway with Rhomberg testing.     COORDINATION/GAIT: Mild sensory dysmetria with finger-to- nose-finger testing bilaterally .  Intact rapid alternating movements bilaterally.  Gait is wide-based, spastic as well as ataxic.  He is able to stand on heels and toes, but quickly becomes unsteady.   IMPRESSION: 1.  Bilateral lower extremity paresthesias and sensory ataxia, most consistent with peripheral neuropathy.  Recommend NCS/EMG of the legs to determine the severity of his neuropathy. Based on these results, additional neuropathy labs will be ordered. He has had vitamin B12, MMA, SPEP with IFE checked by his PCP which is normal.  He has been drinking 32 oz beer nightly for 30+ years, which can contribute to neuropathy so have asked him to cut back on his alcohol consumption.   His CK is mildly elevated, however, exam does not show evidence of muscle disease.  EMG will evaluate for  myopathic process.  Fall precautions discussed.  Recommend using a cane always.  Because of the severity of his proprioceptive loss and numbness, I do not recommend that he drive if he is unable to safely manipulate pedals.    2.  S/p lumbar fusion and thoracic to pelvis fixation.  He has spastic appearing gait and cramps is residual from his spinal stenosis.  He has completed physical therapy for gait and balance with minimal benefit.  Further recommendations pending results   Thank you for allowing me to participate in patient's care.  If I can answer any additional questions, I would be pleased to do so.    Sincerely,    Xochil Shanker K. Posey Pronto, DO

## 2017-05-04 NOTE — Patient Instructions (Addendum)
NCS/EMG of the legs  Do not recommend that you drive if you cannot manipulate the pedals safely.   [facility]  Preventing Falls in the Home   Falls are common, often dreaded events in the lives of older people. Aside from the obvious injuries and even death that may result, falls can cause wide-ranging consequences including loss of independence, mental decline, decreased activity, and mobility. Younger people are also at risk of falling, especially those with chronic illnesses and fatigue.  Ways to reduce the risk for falling:  * Examine diet and medications. Warm foods and alcohol dilate blood vessels, which can lead to dizziness when standing. Sleep aids, antidepressants, and pain medications can also increase the likelihood of a fall.  * Get a vison exam. Poor vision, cataracts, and glaucoma increase the chances of falling.  * Check foot gear. Shoes should fit snugly and have a sturdy, nonskid sole and broad, low heel.  * Participate in a physician-approved exercise program to build and maintain muscle strength and improve balance and coordination.  * Increase vitamin D intake. Vitamin D improves muscle strength and increases the amount of calcium the body is able to absorb and deposit in bones.  How to prevent falls from common hazards:  * Floors - Remove all loose wires, cords, and throw rugs. Minimize clutter. Make sure rugs are anchored and smooth. Keep furniture in its usual place.  * Chairs - Use chairs with straight backs, armrests, and firm seats. Add firm cushions to existing pieces to add height.  * Bathroom - Install grab bars and non-skid tape in the tub or shower. Use a bathtub transfer bench or a shower chair with a back support. Use an elevated toilet seat and/or safety rails to assist standing from a low surface. Do not use towel racks or bathroom tissue holders to help you stand.  * Lighting - Make sure halls, stairways, and entrances are well-lit. Install a night light  in your bathroom or hallway. Make sure there is a light switch at the top and bottom of the staircase. Turn lights on if you get up in the middle of the night. Make sure lamps or light switches are within reach of the bed if you have to get up during the night.  * Kitchen - Install non-skid rubber mats near the sink and stove. Clean spills immediately. Store frequently used utensils, pots, and pans between waist and eye level. This helps prevent reaching and bending. Sit when getting things out of the lower cupboards.  * Living room / [place] furniture with wide spaces in between, giving enough room to move around. Establish a route through the living room that gives you something to hold onto as you walk.  * Stairs - Make sure treads, rails, and rugs are secure. Install a rail on both sides of the stairs. If stairs are a threat, it might be helpful to arrange most of your activities on the lower level to reduce the number of times you must climb the stairs.  * Entrances and doorways - Install metal handles on the walls adjacent to the doorknobs of all doors to make it more secure as you travel through the doorway.  Tips for maintaining balance:  * Keep at least one hand free at all times Try using a backpack or fanny pack to hold things rather than carrying them in your hands. Never carry objects in both hands when walking as this interferes with keeping your balance.  *  Attempt to swing both arms from front to back while walking. This might require a conscious effort if Parkinson's disease has diminished your movement. It will, however, help you to maintain balance and posture, and reduce fatigue.  * Consciously lift your feet off the ground when walking. Shuffling and dragging of the feet is a common culprit in losing your balance.  * When trying to navigate turns, use a "U" technique of facing forward and making a wide turn, rather than pivoting sharply.  * Try to stand with your feet  shoulder-length apart. When your feet are close together for any length of time, you increase your risk of losing your balance and falling.  * Do one thing at a time. Do not try to walk and accomplish another task, such as reading or looking around. The decrease in your automatic reflexes complicates motor function, so the less distraction, the better.  * Do not wear rubber or gripping soled shoes, they might "catch" on the floor and cause tripping.  * Move slowly when changing positions. Use deliberate, concentrated movements and, if needed, use a grab bar or walking aid. Count fifteen (15) seconds after standing to begin walking.  * If balance is a continuous problem, you might want to consider a walking aid such as a cane, walking stick, or walker. Once you have mastered walking with help, you may be ready to try it again on your own.  This information is provided by [facility] and is not intended to replace the medical advice of your physician or other health care providers. Please consult your physician or other health care providers for advice regarding your specific medical condition.

## 2017-05-10 MED FILL — CYCLOBENZAPRINE HCL 10 MG T: 10 | 30 days supply | Qty: 90 | Fill #1

## 2017-05-15 ENCOUNTER — Ambulatory Visit (INDEPENDENT_AMBULATORY_CARE_PROVIDER_SITE_OTHER): Payer: 59 | Admitting: Neurology

## 2017-05-15 ENCOUNTER — Ambulatory Visit: Payer: 59 | Admitting: Sports Medicine

## 2017-05-15 DIAGNOSIS — M48062 Spinal stenosis, lumbar region with neurogenic claudication: Secondary | ICD-10-CM

## 2017-05-15 DIAGNOSIS — M5417 Radiculopathy, lumbosacral region: Secondary | ICD-10-CM

## 2017-05-15 NOTE — Procedures (Signed)
[facility]  [place], [address]  [place], [place] [address] Tel: [phone] Fax:  [phone] Test Date:  05/15/2017  Patient: Travis Huff DOB: 1960/02/03 Physician: Narda Amber, DO  Sex: Male Height: 6\' 0"  Ref Phys: Narda Amber, DO  ID#: [id number] Temp: 32.6C Technician:    Patient Complaints: This is a 57 year old man s/p lumbar fusion referred for evaluation of bilateral leg paresthesia, gait instability, and ataxia.  NCV & EMG Findings: Extensive electrodiagnostic testing of the right lower extremity and additional studies of the left shows: 1. Bilateral sural and superficial peroneal sensory responses are within normal limits. 2. Bilateral peroneal (EDB) and tibial motor responses are absent.  Bilateral peroneal motor responses at the tibialis anterior are within normal limits. 3. Bilateral tibial H reflex studies are within normal limits.  4. Chronic motor axonal loss changes are seen affecting the L4-S1 myotomes bilaterally, with evidence of active denervation involving the right tibialis anterior and left medial gastrocnemius muscles.  Impression: The electrophysiologic findings are most consistent with a multilevel active on chronic lumbosacral radiculopathies affecting L4-S1 nerve roots/segments bilaterally; moderate in degree electrically and worse on the left.  There is no evidence of a large fiber sensorimotor polyneuropathy affecting the lower extremities.   ___________________________ Narda Amber, DO    Nerve Conduction Studies Anti Sensory Summary Table   Site NR Peak (ms) Norm Peak (ms) P-T Amp (V) Norm P-T Amp  Left Sup Peroneal Anti Sensory (Ant Lat Mall)  32.6C  12 cm    2.6 <4.6 4.0 >4  Right Sup Peroneal Anti Sensory (Ant Lat Mall)  32.6C  12 cm    3.1 <4.6 5.0 >4  Left Sural Anti Sensory (Lat Mall)  32.6C  Calf    3.4 <4.6 5.4 >4  Right Sural Anti Sensory (Lat Mall)  32.6C  Calf    3.8 <4.6 9.8 >4   Motor Summary  Table   Site NR Onset (ms) Norm Onset (ms) O-P Amp (mV) Norm O-P Amp Site1 Site2 Delta-0 (ms) Dist (cm) Vel (m/s) Norm Vel (m/s)  Left Peroneal Motor (Ext Dig Brev)  32.6C  Ankle NR  <6.0  >2.5 B Fib Ankle  0.0  >40  B Fib NR     Poplt B Fib  0.0  >40  Poplt NR            Right Peroneal Motor (Ext Dig Brev)  32.6C  Ankle NR  <6.0  >2.5 B Fib Ankle  0.0  >40  B Fib NR     Poplt B Fib  0.0  >40  Poplt NR            Left Peroneal TA Motor (Tib Ant)  32.6C  Fib Head    4.1 <4.5 3.2 >3 Poplit Fib Head 1.8 8.0 44 >40  Poplit    5.9  3.0         Right Peroneal TA Motor (Tib Ant)  32.6C  Fib Head    3.9 <4.5 3.3 >3 Poplit Fib Head 1.6 8.0 50 >40  Poplit    5.5  3.3         Left Tibial Motor (Abd Hall Brev)  32.6C  Ankle NR  <6.0  >4 Knee Ankle  0.0  >40  Knee NR            Right Tibial Motor (Abd Hall Brev)  32.6C  Ankle NR  <6.0  >4 Knee Ankle  0.0  >40  Knee NR  H Reflex Studies   NR H-Lat (ms) Lat Norm (ms) L-R H-Lat (ms)  Left Tibial (Gastroc)  32.6C     34.42 <35 5.03  Right Tibial (Gastroc)  32.6C     29.39 <35 5.03   EMG   Side Muscle Ins Act Fibs Psw Fasc Number Recrt Dur Dur. Amp Amp. Poly Poly. Comment  Left AntTibialis Nml Nml Nml Nml 2- Rapid Many 1+ Many 1+ Many 1+ N/A  Left Gastroc Nml 1+ Nml Nml 3- Rapid All 1+ All 1+ All 1+ N/A  Left Flex Dig Long Nml Nml Nml Nml 2- Rapid Many 1+ Many 1+ Many 1+ N/A  Left RectFemoris Nml Nml Nml Nml 1- Rapid Some 1+ Some 1+ Nml Nml N/A  Left GluteusMed Nml Nml Nml Nml 1- Rapid Some 1+ Some 1+ Nml Nml N/A  Left BicepsFemS Nml Nml Nml Nml 2- Rapid Some 1+ Some 1+ Nml Nml N/A  Right BicepsFemS Nml Nml Nml Nml 2- Rapid Some 1+ Some 1+ Some 1+ N/A  Right AntTibialis Nml 1+ Nml Nml 2- Rapid Many 1+ Many 1+ Some 1+ N/A  Right Gastroc Nml Nml Nml Nml 2- Rapid Many 1+ Many 1+ Many 1+ N/A  Right RectFemoris Nml Nml Nml Nml 2- Rapid Few 1+ Few 1+ Nml Nml N/A  Right GluteusMed Nml Nml Nml Nml 1- Rapid Some 1+ Some 1+ Nml  Nml N/A      Waveforms:

## 2017-05-16 ENCOUNTER — Telehealth: Payer: Self-pay | Admitting: *Deleted

## 2017-05-16 NOTE — Telephone Encounter (Signed)
-----   Message from Alda Berthold, DO sent at 05/16/2017 11:44 AM EDT ----- Caryl Pina, please call patient's wife (per patient's request) to go over these results.  Nerve testing shows that his leg pain, numbness and difficulty with walking is all stemming from nerve impingement in his back.  Specifically, there is no injury in the feet or neuropathy.  With his extensive back surgery,  he would need to follow-up with Dr. [name] and discuss whether another MRI is indicated, or if he needs to see rehab specialists (PM&R).  Also, kindly provide driving rehab contact information, as he is having problems manipulating the pedals and feels that he is safe to drive, contrary to my recommendation.   I will also forward this note to patient's PCP to keep him informed of his evaluation. Thanks.

## 2017-05-16 NOTE — Telephone Encounter (Signed)
Attempted to contact patient's wife.  The phone rang and then just hung up.  Will try again later.

## 2017-05-17 ENCOUNTER — Telehealth: Payer: Self-pay | Admitting: *Deleted

## 2017-05-17 NOTE — Telephone Encounter (Signed)
-----   Message from Alda Berthold, DO sent at 05/16/2017 11:44 AM EDT ----- Caryl Pina, please call patient's wife (per patient's request) to go over these results.  Nerve testing shows that his leg pain, numbness and difficulty with walking is all stemming from nerve impingement in his back.  Specifically, there is no injury in the feet or neuropathy.  With his extensive back surgery,  he would need to follow-up with Dr. [name] and discuss whether another MRI is indicated, or if he needs to see rehab specialists (PM&R).  Also, kindly provide driving rehab contact information, as he is having problems manipulating the pedals and feels that he is safe to drive, contrary to my recommendation.   I will also forward this note to patient's PCP to keep him informed of his evaluation. Thanks.

## 2017-05-17 NOTE — Telephone Encounter (Signed)
I spoke with Travis Huff and gave her the results and instructions.  She will call Dr. [name] to set up a follow up appointment with him.  Driving Rehab numbers also given.

## 2017-05-17 NOTE — Telephone Encounter (Signed)
Left message for Travis Huff to call me back.

## 2017-05-18 ENCOUNTER — Encounter: Payer: Self-pay | Admitting: Sports Medicine

## 2017-05-18 ENCOUNTER — Ambulatory Visit (INDEPENDENT_AMBULATORY_CARE_PROVIDER_SITE_OTHER): Payer: 59 | Admitting: Sports Medicine

## 2017-05-18 ENCOUNTER — Other Ambulatory Visit (HOSPITAL_COMMUNITY): Payer: Self-pay | Admitting: Neurosurgery

## 2017-05-18 DIAGNOSIS — M48062 Spinal stenosis, lumbar region with neurogenic claudication: Secondary | ICD-10-CM | POA: Diagnosis not present

## 2017-05-18 DIAGNOSIS — Z981 Arthrodesis status: Secondary | ICD-10-CM

## 2017-05-18 DIAGNOSIS — M5416 Radiculopathy, lumbar region: Secondary | ICD-10-CM

## 2017-05-18 MED ORDER — PHENTERMINE HCL 37.5 MG PO TABS: ORAL_TABLET | ORAL | 0 refills | Status: DC

## 2017-05-18 MED FILL — PHENTERMINE 37.5 MG TABLET: 37.5 | 30 days supply | Qty: 30 | Fill #0

## 2017-05-18 NOTE — Assessment & Plan Note (Signed)
Starting phentermine, return monthly for weight checks and refills. Nuclear stress test negative in 2014. No history of CAD, arrhythmias.

## 2017-05-18 NOTE — Assessment & Plan Note (Addendum)
Nerve conduction study shows multilevel lumbar radiculopathy, numbness in the feet can persist for 1 year or more after decompression, and may never resolve. I did discuss this with him. I am going to obtain a new MRI with contrast for further evaluation of his anatomy postoperatively. Because his fusion is from T10 to the pelvis we need to add a thoracic spine MRI with and without contrast.

## 2017-05-18 NOTE — Progress Notes (Signed)
Subjective:    CC: Follow-up  HPI: Lower extremity paresthesias: Travis Huff has had extensive back surgery, he had very severe multilevel lumbar spinal stenosis, ultimately ended up with a T12 to pelvic fusion with instrumentation.  Anterior/posterior procedure.  We ultimately proceeded with nerve conduction and a letter myelography, this showed more of a multilevel lumbar radiculopathy as a cause of his lower extremity paresthesias.  He has not had repeat imaging.  Some difficulty with balance.  In addition he has been struggling to lose weight, agreeable to proceed with weight loss treatment now.  In 2014 he had a negative nuclear stress test, no history of coronary artery disease.  I reviewed the past medical history, family history, social history, surgical history, and allergies today and no changes were needed.  Please see the problem list section below in epic for further details.  Past Medical History: Past Medical History:  Diagnosis Date  . Arthritis   . Asthma   . GERD (gastroesophageal reflux disease)    uses tums  . Hypertension   . Viral pericarditis    Past Surgical History: Past Surgical History:  Procedure Laterality Date  . ABDOMINAL EXPOSURE N/A 06/23/2016   Procedure: ABDOMINAL EXPOSURE;  Surgeon: Rosetta Posner, MD;  Location: [facility];  Service: Vascular;  Laterality: N/A;  . ANTERIOR LATERAL LUMBAR FUSION 4 LEVELS Right 06/23/2016   Procedure: Right  Lumbar two-three Lumbar three-four Lumbar four-five Anterolateral lumbar interbody fusion;  Surgeon: Erline Levine, MD;  Location: [place] [facility];  Service: Neurosurgery;  Laterality: Right;  . ANTERIOR LUMBAR FUSION N/A 06/23/2016   Procedure: Lumbar five -sacral one  Anterior lumbar interbody fusion with Dr. Sherren Mocha Early for approach;  Surgeon: Erline Levine, MD;  Location: [place];  Service: Neurosurgery;  Laterality: N/A;  . APPLICATION OF INTRAOPERATIVE CT SCAN N/A 06/26/2016   Procedure: APPLICATION OF INTRAOPERATIVE CT SCAN;   Surgeon: Erline Levine, MD;  Location: [place];  Service: Neurosurgery;  Laterality: N/A;  . CARDIOVASCULAR STRESS TEST     Negative in May 2014  . COLONOSCOPY    . HERNIA REPAIR    . JOINT REPLACEMENT    . KNEE SURGERY    . POSTERIOR LUMBAR FUSION 4 LEVEL N/A 06/26/2016   Procedure: Thoracic ten to Pelvis fixation with AIRO;  Surgeon: Erline Levine, MD;  Location: [place];  Service: Neurosurgery;  Laterality: N/A;  . TENDON REPAIR     right hand  . TOTAL HIP ARTHROPLASTY Right 05/25/2014   Procedure: RIGHT TOTAL HIP ARTHROPLASTY ANTERIOR APPROACH;  Surgeon: Rod Can, MD;  Location: [facility];  Service: Orthopedics;  Laterality: Right;  . TOTAL KNEE ARTHROPLASTY Bilateral    Social History: Social History   Socioeconomic History  . Marital status: Married    Spouse name: Not on file  . Number of children: 2  . Years of education: 37  . Highest education level: Not on file  Occupational History  . Occupation: Public relations account executive  Social Needs  . Financial resource strain: Not on file  . Food insecurity:    Worry: Not on file    Inability: Not on file  . Transportation needs:    Medical: Not on file    Non-medical: Not on file  Tobacco Use  . Smoking status: Former Smoker    Packs/day: 1.00    Years: 25.00    Pack years: 25.00    Types: Cigarettes    Last attempt to quit: 2000    Years since quitting: 19.3  . Smokeless  tobacco: Current User    Types: Snuff  Substance and Sexual Activity  . Alcohol use: Yes    Alcohol/week: 8.4 oz    Types: 14 Cans of beer per week    Comment: 2 beers QD  . Drug use: No  . Sexual activity: Not on file  Lifestyle  . Physical activity:    Days per week: Not on file    Minutes per session: Not on file  . Stress: Not on file  Relationships  . Social connections:    Talks on phone: Not on file    Gets together: Not on file    Attends religious service: Not on file    Active member of club or organization: Not on file    Attends meetings  of clubs or organizations: Not on file    Relationship status: Not on file  Other Topics Concern  . Not on file  Social History Narrative   Lives with wife in a one story home.  Has 2 children.  Works as a Public relations account executive.  Education: high school.    Family History: Family History  Adopted: Yes  Problem Relation Age of Onset  . Healthy Daughter    Allergies: Allergies  Allergen Reactions  . Dilaudid [Hydromorphone Hcl] Itching and Other (See Comments)    Couldn't sleep  . Morphine And Related Itching   Medications: See med rec.  Review of Systems: No fevers, chills, night sweats, weight loss, chest pain, or shortness of breath.   Objective:    General: Well Developed, well nourished, and in no acute distress.  Neuro: Alert and oriented x3, extra-ocular muscles intact, sensation grossly intact.  HEENT: Normocephalic, atraumatic, pupils equal round reactive to light, neck supple, no masses, no lymphadenopathy, thyroid nonpalpable.  Skin: Warm and dry, no rashes. Cardiac: Regular rate and rhythm, no murmurs rubs or gallops, no lower extremity edema.  Respiratory: Clear to auscultation bilaterally. Not using accessory muscles, speaking in full sentences.  Impression and Recommendations:    Spinal stenosis, lumbar region, with neurogenic claudication Nerve conduction study shows multilevel lumbar radiculopathy, numbness in the feet can persist for 1 year or more after decompression, and may never resolve. I did discuss this with him. I am going to obtain a new MRI with contrast for further evaluation of his anatomy postoperatively. Because his fusion is from T10 to the pelvis we need to add a thoracic spine MRI with and without contrast.   Morbid obesity Starting phentermine, return monthly for weight checks and refills. Nuclear stress test negative in 2014. No history of CAD, arrhythmias.  I spent 25 minutes with this patient, greater than 50% was face-to-face time  counseling regarding the above diagnoses ___________________________________________ Gwen Her. Dianah Field, M.D., ABFM., CAQSM. Primary Care and [place] Instructor of [place] [facility]

## 2017-05-19 ENCOUNTER — Ambulatory Visit (HOSPITAL_COMMUNITY)
Admission: RE | Admit: 2017-05-19 | Discharge: 2017-05-19 | Disposition: A | Discharge status: Home / Self Care | Payer: 59 | Source: Ambulatory Visit | Attending: Sports Medicine | Admitting: Sports Medicine

## 2017-05-19 DIAGNOSIS — M48062 Spinal stenosis, lumbar region with neurogenic claudication: Secondary | ICD-10-CM | POA: Insufficient documentation

## 2017-05-19 DIAGNOSIS — Z981 Arthrodesis status: Secondary | ICD-10-CM | POA: Insufficient documentation

## 2017-05-19 DIAGNOSIS — M47814 Spondylosis without myelopathy or radiculopathy, thoracic region: Secondary | ICD-10-CM | POA: Diagnosis not present

## 2017-05-19 DIAGNOSIS — M5124 Other intervertebral disc displacement, thoracic region: Secondary | ICD-10-CM | POA: Diagnosis not present

## 2017-05-19 DIAGNOSIS — M48061 Spinal stenosis, lumbar region without neurogenic claudication: Secondary | ICD-10-CM | POA: Diagnosis not present

## 2017-05-19 MED ORDER — GADOBENATE DIMEGLUMINE 529 MG/ML IV SOLN
20.0000 mL | Freq: Once | INTRAVENOUS | Status: AC | PRN
  Administered 2017-05-19: 20 mL via INTRAVENOUS

## 2017-05-21 MED FILL — DULoxetine HCL 60 MG CPEP: 60 | 30 days supply | Qty: 30 | Fill #2

## 2017-05-24 ENCOUNTER — Ambulatory Visit: Payer: 59 | Admitting: Sports Medicine

## 2017-05-25 ENCOUNTER — Ambulatory Visit: Payer: 59 | Admitting: Neurology

## 2017-05-29 ENCOUNTER — Ambulatory Visit (INDEPENDENT_AMBULATORY_CARE_PROVIDER_SITE_OTHER): Payer: 59 | Admitting: Sports Medicine

## 2017-05-29 ENCOUNTER — Encounter: Payer: Self-pay | Admitting: Sports Medicine

## 2017-05-29 DIAGNOSIS — M48062 Spinal stenosis, lumbar region with neurogenic claudication: Secondary | ICD-10-CM | POA: Diagnosis not present

## 2017-05-29 NOTE — Assessment & Plan Note (Signed)
Multilevel lumbar radiculopathy on nerve conduction study, the symptoms can persist for 1 year after decompression, but unfortunately may never fully resolved. MRI showed T8-T9 and T9-T10 disc protrusions, we are going to proceed with a left T8-T9 interlaminar epidural with Dr. Nelva Bush. Return to see me 1 month after injection to evaluate relief.

## 2017-05-29 NOTE — Addendum Note (Signed)
Addended by: Silverio Decamp on: 05/29/2017 04:22 PM   Modules accepted: Orders

## 2017-05-29 NOTE — Progress Notes (Signed)
Subjective:    CC: MRI follow-up  HPI: Travis Huff returns, he is post T10 to pelvic fusion, persistent lumbar radiculopathy, imbalance, weakness.  We obtained a new thoracolumbar MRI, the operated on level appears good, he does have new protruding discs between T8-T9 and T9-T10.  Nerve conduction study did show multilevel lumbar radiculopathy.  I reviewed the past medical history, family history, social history, surgical history, and allergies today and no changes were needed.  Please see the problem list section below in epic for further details.  Past Medical History: Past Medical History:  Diagnosis Date  . Arthritis   . Asthma   . GERD (gastroesophageal reflux disease)    uses tums  . Hypertension   . Viral pericarditis    Past Surgical History: Past Surgical History:  Procedure Laterality Date  . ABDOMINAL EXPOSURE N/A 06/23/2016   Procedure: ABDOMINAL EXPOSURE;  Surgeon: Rosetta Posner, MD;  Location[facility];  Service: Vascular;  Laterality: N/A;  . ANTERIOR LATERAL LUMBAR FUSION 4 LEVELS Right 06/23/2016   Procedure: Right  Lumbar two-three Lumbar three-four Lumbar four-five Anterolateral lumbar interbody fusion;  Surgeon: Erline Levine, MD;  Location: [facility];  Service: Neurosurgery;  Laterality: Right;  . ANTERIOR LUMBAR FUSION N/A 06/23/2016   Procedure: Lumbar five -sacral one  Anterior lumbar interbody fusion with Dr. Sherren Mocha Early for approach;  Surgeon: Erline Levine, MD;  Location: [facility];  Service: Neurosurgery;  Laterality: N/A;  . APPLICATION OF INTRAOPERATIVE CT SCAN N/A 06/26/2016   Procedure: APPLICATION OF INTRAOPERATIVE CT SCAN;  Surgeon: Erline Levine, MD;  Location: [place] [facility];  Service: Neurosurgery;  Laterality: N/A;  . CARDIOVASCULAR STRESS TEST     Negative in May 2014  . COLONOSCOPY    . HERNIA REPAIR    . JOINT REPLACEMENT    . KNEE SURGERY    . POSTERIOR LUMBAR FUSION 4 LEVEL N/A 06/26/2016   Procedure: Thoracic ten to Pelvis fixation with AIRO;  Surgeon: Erline Levine, MD;  Location: [facility];  Service: Neurosurgery;  Laterality: N/A;  . TENDON REPAIR     right hand  . TOTAL HIP ARTHROPLASTY Right 05/25/2014   Procedure: RIGHT TOTAL HIP ARTHROPLASTY ANTERIOR APPROACH;  Surgeon: Rod Can, MD;  Location: [facility];  Service: Orthopedics;  Laterality: Right;  . TOTAL KNEE ARTHROPLASTY Bilateral    Social History: Social History   Socioeconomic History  . Marital status: Married    Spouse name: Not on file  . Number of children: 2  . Years of education: 40  . Highest education level: Not on file  Occupational History  . Occupation: Public relations account executive  Social Needs  . Financial resource strain: Not on file  . Food insecurity:    Worry: Not on file    Inability: Not on file  . Transportation needs:    Medical: Not on file    Non-medical: Not on file  Tobacco Use  . Smoking status: Former Smoker    Packs/day: 1.00    Years: 25.00    Pack years: 25.00    Types: Cigarettes    Last attempt to quit: 2000    Years since quitting: 19.4  . Smokeless tobacco: Current User    Types: Snuff  Substance and Sexual Activity  . Alcohol use: Yes    Alcohol/week: 8.4 oz    Types: 14 Cans of beer per week    Comment: 2 beers QD  . Drug use: No  . Sexual activity: Not on file  Lifestyle  .  Physical activity:    Days per week: Not on file    Minutes per session: Not on file  . Stress: Not on file  Relationships  . Social connections:    Talks on phone: Not on file    Gets together: Not on file    Attends religious service: Not on file    Active member of club or organization: Not on file    Attends meetings of clubs or organizations: Not on file    Relationship status: Not on file  Other Topics Concern  . Not on file  Social History Narrative   Lives with wife in a one story home.  Has 2 children.  Works as a Public relations account executive.  Education: high school.    Family History: Family History  Adopted: Yes  Problem Relation Age of Onset  . Healthy  Daughter    Allergies: Allergies  Allergen Reactions  . Dilaudid [Hydromorphone Hcl] Itching and Other (See Comments)    Couldn't sleep  . Morphine And Related Itching   Medications: See med rec.  Review of Systems: No fevers, chills, night sweats, weight loss, chest pain, or shortness of breath.   Objective:    General: Well Developed, well nourished, and in no acute distress.  Neuro: Alert and oriented x3, extra-ocular muscles intact, sensation grossly intact.  HEENT: Normocephalic, atraumatic, pupils equal round reactive to light, neck supple, no masses, no lymphadenopathy, thyroid nonpalpable.  Skin: Warm and dry, no rashes. Cardiac: Regular rate and rhythm, no murmurs rubs or gallops, no lower extremity edema.  Respiratory: Clear to auscultation bilaterally. Not using accessory muscles, speaking in full sentences.  Thoracolumbar MRI personally reviewed, there are protruding discs with mild to moderate central canal stenosis at T8-T9 and T9-T10.  Impression and Recommendations:    Spinal stenosis, lumbar region, with neurogenic claudication Multilevel lumbar radiculopathy on nerve conduction study, the symptoms can persist for 1 year after decompression, but unfortunately may never fully resolved. MRI showed T8-T9 and T9-T10 disc protrusions, we are going to proceed with a left T8-T9 interlaminar epidural with Dr. Nelva Bush. Return to see me 1 month after injection to evaluate relief.  ___________________________________________ Gwen Her. Dianah Field, M.D., ABFM., CAQSM. Primary Care and [place] Instructor of [employer] [facility]

## 2017-05-30 DIAGNOSIS — M5134 Other intervertebral disc degeneration, thoracic region: Secondary | ICD-10-CM | POA: Insufficient documentation

## 2017-05-30 MED FILL — BYSTOLIC 10 MG TABLET: 10 | 90 days supply | Qty: 90 | Fill #2

## 2017-06-13 DIAGNOSIS — I10 Essential (primary) hypertension: Secondary | ICD-10-CM | POA: Diagnosis not present

## 2017-06-13 DIAGNOSIS — M419 Scoliosis, unspecified: Secondary | ICD-10-CM | POA: Diagnosis not present

## 2017-06-13 DIAGNOSIS — M5416 Radiculopathy, lumbar region: Secondary | ICD-10-CM | POA: Diagnosis not present

## 2017-06-13 DIAGNOSIS — Z6841 Body Mass Index (BMI) 40.0 and over, adult: Secondary | ICD-10-CM | POA: Diagnosis not present

## 2017-06-13 DIAGNOSIS — M545 Low back pain: Secondary | ICD-10-CM | POA: Diagnosis not present

## 2017-06-13 DIAGNOSIS — D1779 Benign lipomatous neoplasm of other sites: Secondary | ICD-10-CM | POA: Diagnosis not present

## 2017-06-13 DIAGNOSIS — G629 Polyneuropathy, unspecified: Secondary | ICD-10-CM | POA: Diagnosis not present

## 2017-06-15 ENCOUNTER — Ambulatory Visit (INDEPENDENT_AMBULATORY_CARE_PROVIDER_SITE_OTHER): Payer: 59 | Admitting: Sports Medicine

## 2017-06-15 ENCOUNTER — Encounter: Payer: Self-pay | Admitting: Sports Medicine

## 2017-06-15 DIAGNOSIS — F411 Generalized anxiety disorder: Secondary | ICD-10-CM

## 2017-06-15 DIAGNOSIS — Z0000 Encounter for general adult medical examination without abnormal findings: Secondary | ICD-10-CM

## 2017-06-15 DIAGNOSIS — E782 Mixed hyperlipidemia: Secondary | ICD-10-CM | POA: Diagnosis not present

## 2017-06-15 MED ORDER — PHENTERMINE HCL 37.5 MG PO TABS: ORAL_TABLET | ORAL | 0 refills | Status: DC

## 2017-06-15 MED ORDER — CYCLOBENZAPRINE HCL 10 MG PO TABS: 20.0000 mg | ORAL_TABLET | Freq: Every day | ORAL | 3 refills | Status: DC

## 2017-06-15 MED FILL — CYCLOBENZAPRINE HCL 10 MG T: 10 | 30 days supply | Qty: 90 | Fill #2

## 2017-06-15 MED FILL — PHENTERMINE 37.5 MG TABLET: 37.5 | 30 days supply | Qty: 30 | Fill #0

## 2017-06-15 NOTE — Assessment & Plan Note (Signed)
We will try a full 6 months of weight loss before rechecking lipids and then considering statin.

## 2017-06-15 NOTE — Assessment & Plan Note (Signed)
Refilling phentermine as we enter the second month.Good initial weight loss,

## 2017-06-15 NOTE — Progress Notes (Signed)
Subjective:    CC: Annual physical exam  HPI:  This is a pleasant 57 year old male here for his physical.  Obesity: Good weight loss after the first month.  Hyperlipidemia: Slightly elevated LDL, would like to do some weight loss before considering a cholesterol medication.  I reviewed the past medical history, family history, social history, surgical history, and allergies today and no changes were needed.  Please see the problem list section below in epic for further details.  Past Medical History: Past Medical History:  Diagnosis Date  . Arthritis   . Asthma   . GERD (gastroesophageal reflux disease)    uses tums  . Hypertension   . Viral pericarditis    Past Surgical History: Past Surgical History:  Procedure Laterality Date  . ABDOMINAL EXPOSURE N/A 06/23/2016   Procedure: ABDOMINAL EXPOSURE;  Surgeon: Rosetta Posner, MD;  Location: [place];  Service: Vascular;  Laterality: N/A;  . ANTERIOR LATERAL LUMBAR FUSION 4 LEVELS Right 06/23/2016   Procedure: Right  Lumbar two-three Lumbar three-four Lumbar four-five Anterolateral lumbar interbody fusion;  Surgeon: Erline Levine, MD;  Location: [place];  Service: Neurosurgery;  Laterality: Right;  . ANTERIOR LUMBAR FUSION N/A 06/23/2016   Procedure: Lumbar five -sacral one  Anterior lumbar interbody fusion with Dr. Sherren Mocha Early for approach;  Surgeon: Erline Levine, MD;  Location: [place];  Service: Neurosurgery;  Laterality: N/A;  . APPLICATION OF INTRAOPERATIVE CT SCAN N/A 06/26/2016   Procedure: APPLICATION OF INTRAOPERATIVE CT SCAN;  Surgeon: Erline Levine, MD;  Location: [place];  Service: Neurosurgery;  Laterality: N/A;  . CARDIOVASCULAR STRESS TEST     Negative in May 2014  . COLONOSCOPY    . HERNIA REPAIR    . JOINT REPLACEMENT    . KNEE SURGERY    . POSTERIOR LUMBAR FUSION 4 LEVEL N/A 06/26/2016   Procedure: Thoracic ten to Pelvis fixation with AIRO;  Surgeon: Erline Levine, MD;  Location: [place];  Service: Neurosurgery;  Laterality:  N/A;  . TENDON REPAIR     right hand  . TOTAL HIP ARTHROPLASTY Right 05/25/2014   Procedure: RIGHT TOTAL HIP ARTHROPLASTY ANTERIOR APPROACH;  Surgeon: Rod Can, MD;  Location: [place];  Service: Orthopedics;  Laterality: Right;  . TOTAL KNEE ARTHROPLASTY Bilateral    Social History: Social History   Socioeconomic History  . Marital status: Married    Spouse name: Not on file  . Number of children: 2  . Years of education: 69  . Highest education level: Not on file  Occupational History  . Occupation: Public relations account executive  Social Needs  . Financial resource strain: Not on file  . Food insecurity:    Worry: Not on file    Inability: Not on file  . Transportation needs:    Medical: Not on file    Non-medical: Not on file  Tobacco Use  . Smoking status: Former Smoker    Packs/day: 1.00    Years: 25.00    Pack years: 25.00    Types: Cigarettes    Last attempt to quit: 2000    Years since quitting: 19.4  . Smokeless tobacco: Current User    Types: Snuff  Substance and Sexual Activity  . Alcohol use: Yes    Alcohol/week: 8.4 oz    Types: 14 Cans of beer per week    Comment: 2 beers QD  . Drug use: No  . Sexual activity: Not on file  Lifestyle  . Physical activity:    Days per week:  Not on file    Minutes per session: Not on file  . Stress: Not on file  Relationships  . Social connections:    Talks on phone: Not on file    Gets together: Not on file    Attends religious service: Not on file    Active member of club or organization: Not on file    Attends meetings of clubs or organizations: Not on file    Relationship status: Not on file  Other Topics Concern  . Not on file  Social History Narrative   Lives with wife in a one story home.  Has 2 children.  Works as a Public relations account executive.  Education: high school.    Family History: Family History  Adopted: Yes  Problem Relation Age of Onset  . Healthy Daughter    Allergies: Allergies  Allergen Reactions  . Dilaudid  [Hydromorphone Hcl] Itching and Other (See Comments)    Couldn't sleep  . Morphine And Related Itching   Medications: See med rec.  Review of Systems: No headache, visual changes, nausea, vomiting, diarrhea, constipation, dizziness, abdominal pain, skin rash, fevers, chills, night sweats, swollen lymph nodes, weight loss, chest pain, body aches, joint swelling, muscle aches, shortness of breath, mood changes, visual or auditory hallucinations.  Objective:    General: Well Developed, well nourished, and in no acute distress.  Neuro: Alert and oriented x3, extra-ocular muscles intact, sensation grossly intact. Cranial nerves II through XII are intact, motor, sensory, and coordinative functions are all intact. HEENT: Normocephalic, atraumatic, pupils equal round reactive to light, neck supple, no masses, no lymphadenopathy, thyroid nonpalpable. Oropharynx, nasopharynx, external ear canals are unremarkable. Skin: Warm and dry, no rashes noted.  Cardiac: Regular rate and rhythm, no murmurs rubs or gallops.  Respiratory: Clear to auscultation bilaterally. Not using accessory muscles, speaking in full sentences.  Abdominal: Soft, nontender, nondistended, positive bowel sounds, no masses, no organomegaly.  Musculoskeletal: Shoulder, elbow, wrist, hip, knee, ankle stable, and with full range of motion.  Impression and Recommendations:    The patient was counselled, risk factors were discussed, anticipatory guidance given.  Annual physical exam Unremarkable physical. He does need to get back in for his 5-year follow-up colonoscopy.  Morbid obesity ([place]) Refilling phentermine as we enter the second month.Good initial weight loss,  Mixed hyperlipidemia We will try a full 6 months of weight loss before rechecking lipids and then considering statin. ___________________________________________ Gwen Her. Dianah Field, M.D., ABFM., CAQSM. Primary Care and [place] Instructor of [place] of [facility]

## 2017-06-15 NOTE — Assessment & Plan Note (Signed)
Unremarkable physical. He does need to get back in for his 5-year follow-up colonoscopy.

## 2017-06-18 ENCOUNTER — Other Ambulatory Visit: Payer: Self-pay | Admitting: Sports Medicine

## 2017-06-18 DIAGNOSIS — F411 Generalized anxiety disorder: Secondary | ICD-10-CM

## 2017-06-18 MED FILL — DULoxetine HCL 60 MG CPEP: 60 | 30 days supply | Qty: 30 | Fill #0

## 2017-07-13 ENCOUNTER — Encounter: Payer: Self-pay | Admitting: Sports Medicine

## 2017-07-13 ENCOUNTER — Ambulatory Visit (INDEPENDENT_AMBULATORY_CARE_PROVIDER_SITE_OTHER): Payer: 59 | Admitting: Sports Medicine

## 2017-07-13 MED ORDER — PHENTERMINE HCL 37.5 MG PO TABS: ORAL_TABLET | ORAL | 0 refills | Status: DC

## 2017-07-13 MED FILL — PHENTERMINE 37.5 MG TABLET: 37.5 | 30 days supply | Qty: 30 | Fill #0

## 2017-07-13 NOTE — Assessment & Plan Note (Addendum)
Entering the third month, did have some mild weight loss but stopped the medicine earlier in the month.   I would like him to restart at a half dose.  May go up to a half tab twice a day if does not have any adverse effects. Adding topiramate at the next visit if insufficient weight loss.

## 2017-07-13 NOTE — Progress Notes (Signed)
Subjective:    CC: Weight check  HPI: Gearald returns, he is lost a few pounds, he did in fact stopped his phentermine, he felt a little nausea early in the month and stopped it on his own.  I reviewed the past medical history, family history, social history, surgical history, and allergies today and no changes were needed.  Please see the problem list section below in epic for further details.  Past Medical History: Past Medical History:  Diagnosis Date  . Arthritis   . Asthma   . GERD (gastroesophageal reflux disease)    uses tums  . Hypertension   . Viral pericarditis    Past Surgical History: Past Surgical History:  Procedure Laterality Date  . ABDOMINAL EXPOSURE N/A 06/23/2016   Procedure: ABDOMINAL EXPOSURE;  Surgeon: Rosetta Posner, MD;  Location: [place];  Service: Vascular;  Laterality: N/A;  . ANTERIOR LATERAL LUMBAR FUSION 4 LEVELS Right 06/23/2016   Procedure: Right  Lumbar two-three Lumbar three-four Lumbar four-five Anterolateral lumbar interbody fusion;  Surgeon: Erline Levine, MD;  Location: [facility];  Service: Neurosurgery;  Laterality: Right;  . ANTERIOR LUMBAR FUSION N/A 06/23/2016   Procedure: Lumbar five -sacral one  Anterior lumbar interbody fusion with Dr. Sherren Mocha Early for approach;  Surgeon: Erline Levine, MD;  Location: [facility];  Service: Neurosurgery;  Laterality: N/A;  . APPLICATION OF INTRAOPERATIVE CT SCAN N/A 06/26/2016   Procedure: APPLICATION OF INTRAOPERATIVE CT SCAN;  Surgeon: Erline Levine, MD;  Location: [facility];  Service: Neurosurgery;  Laterality: N/A;  . CARDIOVASCULAR STRESS TEST     Negative in May 2014  . COLONOSCOPY    . HERNIA REPAIR    . JOINT REPLACEMENT    . KNEE SURGERY    . POSTERIOR LUMBAR FUSION 4 LEVEL N/A 06/26/2016   Procedure: Thoracic ten to Pelvis fixation with AIRO;  Surgeon: Erline Levine, MD;  Location: [facility];  Service: Neurosurgery;  Laterality: N/A;  . TENDON REPAIR     right hand  . TOTAL HIP ARTHROPLASTY Right 05/25/2014   Procedure: RIGHT TOTAL HIP ARTHROPLASTY ANTERIOR APPROACH;  Surgeon: Rod Can, MD;  Location: [facility];  Service: Orthopedics;  Laterality: Right;  . TOTAL KNEE ARTHROPLASTY Bilateral    Social History: Social History   Socioeconomic History  . Marital status: Married    Spouse name: Not on file  . Number of children: 2  . Years of education: 75  . Highest education level: Not on file  Occupational History  . Occupation: Public relations account executive  Social Needs  . Financial resource strain: Not on file  . Food insecurity:    Worry: Not on file    Inability: Not on file  . Transportation needs:    Medical: Not on file    Non-medical: Not on file  Tobacco Use  . Smoking status: Former Smoker    Packs/day: 1.00    Years: 25.00    Pack years: 25.00    Types: Cigarettes    Last attempt to quit: 2000    Years since quitting: 19.5  . Smokeless tobacco: Current User    Types: Snuff  Substance and Sexual Activity  . Alcohol use: Yes    Alcohol/week: 8.4 oz    Types: 14 Cans of beer per week    Comment: 2 beers QD  . Drug use: No  . Sexual activity: Not on file  Lifestyle  . Physical activity:    Days per week: Not on file    Minutes per session:  Not on file  . Stress: Not on file  Relationships  . Social connections:    Talks on phone: Not on file    Gets together: Not on file    Attends religious service: Not on file    Active member of club or organization: Not on file    Attends meetings of clubs or organizations: Not on file    Relationship status: Not on file  Other Topics Concern  . Not on file  Social History Narrative   Lives with wife in a one story home.  Has 2 children.  Works as a Public relations account executive.  Education: high school.    Family History: Family History  Adopted: Yes  Problem Relation Age of Onset  . Healthy Daughter    Allergies: Allergies  Allergen Reactions  . Dilaudid [Hydromorphone Hcl] Itching and Other (See Comments)    Couldn't sleep  . Morphine  And Related Itching   Medications: See med rec.  Review of Systems: No fevers, chills, night sweats, weight loss, chest pain, or shortness of breath.   Objective:    General: Well Developed, well nourished, and in no acute distress.  Neuro: Alert and oriented x3, extra-ocular muscles intact, sensation grossly intact.  HEENT: Normocephalic, atraumatic, pupils equal round reactive to light, neck supple, no masses, no lymphadenopathy, thyroid nonpalpable.  Skin: Warm and dry, no rashes. Cardiac: Regular rate and rhythm, no murmurs rubs or gallops, no lower extremity edema.  Respiratory: Clear to auscultation bilaterally. Not using accessory muscles, speaking in full sentences.  Impression and Recommendations:    Morbid obesity ([place]) Entering the third month, did have some mild weight loss but stopped the medicine earlier in the month.   I would like him to restart at a half dose.  May go up to a half tab twice a day if does not have any adverse effects. Adding topiramate at the next visit if insufficient weight loss. ___________________________________________ Gwen Her. Dianah Field, M.D., ABFM., CAQSM. Primary Care and [place] Instructor of [place] of [facility] of Medicine

## 2017-07-24 ENCOUNTER — Emergency Department (INDEPENDENT_AMBULATORY_CARE_PROVIDER_SITE_OTHER): Payer: 59

## 2017-07-24 ENCOUNTER — Other Ambulatory Visit: Payer: Self-pay

## 2017-07-24 ENCOUNTER — Emergency Department
Admission: EM | Admit: 2017-07-24 | Discharge: 2017-07-24 | Disposition: A | Discharge status: Home / Self Care | Payer: 59 | Source: Home / Self Care | Attending: Family Medicine | Admitting: Family Medicine

## 2017-07-24 DIAGNOSIS — M25552 Pain in left hip: Secondary | ICD-10-CM

## 2017-07-24 DIAGNOSIS — M1612 Unilateral primary osteoarthritis, left hip: Secondary | ICD-10-CM | POA: Diagnosis not present

## 2017-07-24 NOTE — Consult Note (Addendum)
Subjective:    CC: Left hip pain  HPI: Travis Huff is a pleasant 57 year old male with multiple degenerative comorbidities, he is post right total hip arthroplasty, more recently was grocery shopping, felt an immediate pain in his left groin with radiation to the left buttock.  Difficulty walking, was seen in urgent care where x-rays showed arthritis in his left hip joint as well as concern for loosening of his left sacral screw.  I am called for further evaluation and definitive treatment.  Pain is worst in the groin.  I reviewed the past medical history, family history, social history, surgical history, and allergies today and no changes were needed.  Please see the problem list section below in epic for further details.  Past Medical History: Past Medical History:  Diagnosis Date  . Arthritis   . Asthma   . GERD (gastroesophageal reflux disease)    uses tums  . Hypertension   . Viral pericarditis    Past Surgical History: Past Surgical History:  Procedure Laterality Date  . ABDOMINAL EXPOSURE N/A 06/23/2016   Procedure: ABDOMINAL EXPOSURE;  Surgeon: Rosetta Posner, MD;  Location: [facility];  Service: Vascular;  Laterality: N/A;  . ANTERIOR LATERAL LUMBAR FUSION 4 LEVELS Right 06/23/2016   Procedure: Right  Lumbar two-three Lumbar three-four Lumbar four-five Anterolateral lumbar interbody fusion;  Surgeon: Erline Levine, MD;  Location: [facility];  Service: Neurosurgery;  Laterality: Right;  . ANTERIOR LUMBAR FUSION N/A 06/23/2016   Procedure: Lumbar five -sacral one  Anterior lumbar interbody fusion with Dr. Sherren Mocha Early for approach;  Surgeon: Erline Levine, MD;  Location: [facility];  Service: Neurosurgery;  Laterality: N/A;  . APPLICATION OF INTRAOPERATIVE CT SCAN N/A 06/26/2016   Procedure: APPLICATION OF INTRAOPERATIVE CT SCAN;  Surgeon: Erline Levine, MD;  Location: [facility];  Service: Neurosurgery;  Laterality: N/A;  . CARDIOVASCULAR STRESS TEST     Negative in May 2014  . COLONOSCOPY    . HERNIA  REPAIR    . JOINT REPLACEMENT    . KNEE SURGERY    . POSTERIOR LUMBAR FUSION 4 LEVEL N/A 06/26/2016   Procedure: Thoracic ten to Pelvis fixation with AIRO;  Surgeon: Erline Levine, MD;  Location: [facility];  Service: Neurosurgery;  Laterality: N/A;  . TENDON REPAIR     right hand  . TOTAL HIP ARTHROPLASTY Right 05/25/2014   Procedure: RIGHT TOTAL HIP ARTHROPLASTY ANTERIOR APPROACH;  Surgeon: Rod Can, MD;  Location: [facility];  Service: Orthopedics;  Laterality: Right;  . TOTAL KNEE ARTHROPLASTY Bilateral    Social History: Social History   Socioeconomic History  . Marital status: Married    Spouse name: Not on file  . Number of children: 2  . Years of education: 34  . Highest education level: Not on file  Occupational History  . Occupation: Public relations account executive  Social Needs  . Financial resource strain: Not on file  . Food insecurity:    Worry: Not on file    Inability: Not on file  . Transportation needs:    Medical: Not on file    Non-medical: Not on file  Tobacco Use  . Smoking status: Former Smoker    Packs/day: 1.00    Years: 25.00    Pack years: 25.00    Types: Cigarettes    Last attempt to quit: 2000    Years since quitting: 19.5  . Smokeless tobacco: Current User    Types: Snuff  Substance and Sexual Activity  . Alcohol use: Yes  Alcohol/week: 8.4 oz    Types: 14 Cans of beer per week    Comment: 2 beers QD  . Drug use: No  . Sexual activity: Not on file  Lifestyle  . Physical activity:    Days per week: Not on file    Minutes per session: Not on file  . Stress: Not on file  Relationships  . Social connections:    Talks on phone: Not on file    Gets together: Not on file    Attends religious service: Not on file    Active member of club or organization: Not on file    Attends meetings of clubs or organizations: Not on file    Relationship status: Not on file  Other Topics Concern  . Not on file  Social History Narrative   Lives with wife in a one story  home.  Has 2 children.  Works as a Public relations account executive.  Education: high school.    Family History: Family History  Adopted: Yes  Problem Relation Age of Onset  . Healthy Daughter    Allergies: Allergies  Allergen Reactions  . Dilaudid [Hydromorphone Hcl] Itching and Other (See Comments)    Couldn't sleep  . Morphine And Related Itching   Medications: See med rec.  Review of Systems: No fevers, chills, night sweats, weight loss, chest pain, or shortness of breath.   Objective:    General: Well Developed, well nourished, and in no acute distress.  Neuro: Alert and oriented x3, extra-ocular muscles intact, sensation grossly intact.  HEENT: Normocephalic, atraumatic, pupils equal round reactive to light, neck supple, no masses, no lymphadenopathy, thyroid nonpalpable.  Skin: Warm and dry, no rashes. Cardiac: Regular rate and rhythm, no murmurs rubs or gallops, no lower extremity edema.  Respiratory: Clear to auscultation bilaterally. Not using accessory muscles, speaking in full sentences. Left hip: ROM IR: 60 Deg, ER: 60 Deg, Flexion: 120 Deg, Extension: 100 Deg, Abduction: 45 Deg, Adduction: 45 Deg Reproduction of pain with internal rotation, minimally positive Patrick's test as well. Strength IR: 5/5, ER: 5/5, Flexion: 5/5, Extension: 5/5, Abduction: 5/5, Adduction: 5/5 Pelvic alignment unremarkable to inspection and palpation. Standing hip rotation and gait without trendelenburg / unsteadiness. Greater trochanter without tenderness to palpation. No tenderness over piriformis. No SI joint tenderness and normal minimal SI movement.  Procedure: Real-time Ultrasound Guided Injection of left femoroacetabular joint Device: GE Logiq E  Verbal informed consent obtained.  Time-out conducted.  Noted no overlying erythema, induration, or other signs of local infection.  Skin prepped in a sterile fashion.  Local anesthesia: Topical Ethyl chloride.  With sterile technique and under real  time ultrasound guidance: 22-gauge spinal needle advanced to the femoral head/neck junction, bone contacted I aspirated 15 to 20 mL of clear, straw-colored fluid, and 1 cc kenalog 40, 2 cc lidocaine, 2 cc bupivacaine injected easily Completed without difficulty  Pain immediately resolved suggesting accurate placement of the medication.  Advised to call if fevers/chills, erythema, induration, drainage, or persistent bleeding.  Images permanently stored and available for review in the ultrasound unit.  Impression: Technically successful ultrasound guided injection.  X-rays personally reviewed, show left hip osteoarthritis, uncomplicated right hip arthroplasty, lucency around the left sacral screw consistent with loosening  Impression and Recommendations:    Primary osteoarthritis of left hip Left hip joint injection as above, return to see me in 3 to 4 weeks, if relief of pain then we can simply watch his sacral screw, if persistent buttock pain we will  need to refer back to neurosurgery.  ___________________________________________ Gwen Her. Dianah Field, M.D., ABFM., CAQSM. Primary Care and [place] Instructor of [place] of [facility] of Medicine

## 2017-07-24 NOTE — ED Provider Notes (Signed)
[facility]    CSN: [id number] Arrival date & time: 07/24/17  1226     History   Chief Complaint Chief Complaint  Patient presents with  . Hip Pain    HPI CELEDONIO SORTINO is a 57 y.o. male.   HPI  JAHMEZ BILY is a 57 y.o. male presenting to UC with c/o Left hip pain that started yesterday while he was putting a cart away at the grocery store. His Left hip "gave out" on him causing him to fall. Pain is moderate to severe, aching and sore, worse with weightbearing and ambulation. He has seen [facility] who performed lumbar surgery 1 year ago and Right total hip replacement 2 years ago. He has also seen Dr. Dianah Field, but was unable to schedule an appointment with either of them today. He took hydrocodone earlier but no relief of the pain. He uses the cane to walk normally but it has not helped ease the pain today. He is concerned the hip will give way again.    Past Medical History:  Diagnosis Date  . Arthritis   . Asthma   . GERD (gastroesophageal reflux disease)    uses tums  . Hypertension   . Viral pericarditis     Patient Active Problem List   Diagnosis Date Noted  . Mixed hyperlipidemia 04/18/2017  . Elevated CK 04/18/2017  . Open dislocation of left second PIP of the foot 11/29/2016  . Scoliosis of thoracolumbar spine 06/23/2016  . Headache 06/20/2016  . Anxiety, generalized 05/11/2016  . Primary osteoarthritis of right hip 05/25/2014  . Benign essential hypertension 03/26/2014  . Spinal stenosis, lumbar region, with neurogenic claudication 12/21/2013  . Primary osteoarthritis of left hip 07/07/2013  . Morbid obesity ([place]) 08/01/2012  . Precordial pain 05/24/2012  . S/P total knee arthroplasty 01/26/2012  . Erectile dysfunction 01/26/2012  . Annual physical exam 01/26/2012    Past Surgical History:  Procedure Laterality Date  . ABDOMINAL EXPOSURE N/A 06/23/2016   Procedure: ABDOMINAL EXPOSURE;  Surgeon: Rosetta Posner, MD;   Location: [facility];  Service: Vascular;  Laterality: N/A;  . ANTERIOR LATERAL LUMBAR FUSION 4 LEVELS Right 06/23/2016   Procedure: Right  Lumbar two-three Lumbar three-four Lumbar four-five Anterolateral lumbar interbody fusion;  Surgeon: Erline Levine, MD;  Location: [facility] [place];  Service: Neurosurgery;  Laterality: Right;  . ANTERIOR LUMBAR FUSION N/A 06/23/2016   Procedure: Lumbar five -sacral one  Anterior lumbar interbody fusion with Dr. Sherren Mocha Early for approach;  Surgeon: Erline Levine, MD;  Location: [facility];  Service: Neurosurgery;  Laterality: N/A;  . APPLICATION OF INTRAOPERATIVE CT SCAN N/A 06/26/2016   Procedure: APPLICATION OF INTRAOPERATIVE CT SCAN;  Surgeon: Erline Levine, MD;  Location: [facility];  Service: Neurosurgery;  Laterality: N/A;  . CARDIOVASCULAR STRESS TEST     Negative in May 2014  . COLONOSCOPY    . HERNIA REPAIR    . JOINT REPLACEMENT    . KNEE SURGERY    . POSTERIOR LUMBAR FUSION 4 LEVEL N/A 06/26/2016   Procedure: Thoracic ten to Pelvis fixation with AIRO;  Surgeon: Erline Levine, MD;  Location: [facility];  Service: Neurosurgery;  Laterality: N/A;  . TENDON REPAIR     right hand  . TOTAL HIP ARTHROPLASTY Right 05/25/2014   Procedure: RIGHT TOTAL HIP ARTHROPLASTY ANTERIOR APPROACH;  Surgeon: Rod Can, MD;  Location: [place];  Service: Orthopedics;  Laterality: Right;  . TOTAL KNEE ARTHROPLASTY Bilateral        Home  Medications    Prior to Admission medications   Medication Sig Start Date End Date Taking? Authorizing Provider  BYSTOLIC 10 MG tablet TAKE 1 TABLET (10 MG TOTAL) BY MOUTH DAILY. 09/26/16   Silverio Decamp, MD  calcium carbonate (TUMS - DOSED IN MG ELEMENTAL CALCIUM) 500 MG chewable tablet Chew 3-4 tablets by mouth at bedtime as needed (stomach cramps).    [provider]  cyclobenzaprine (FLEXERIL) 10 MG tablet Take 2 tablets (20 mg total) by mouth at bedtime. 06/15/17   Silverio Decamp, MD  DULoxetine (CYMBALTA) 60 MG capsule TAKE 1 CAPSULE  (60 MG TOTAL) BY MOUTH DAILY. 06/18/17   Silverio Decamp, MD  DULoxetine 40 MG CPEP Take 40 mg by mouth daily. 06/19/16   Silverio Decamp, MD  lisinopril-hydrochlorothiazide (PRINZIDE,ZESTORETIC) 20-25 MG tablet TAKE 1 TABLET BY MOUTH DAILY. 07/17/16   Silverio Decamp, MD  phentermine (ADIPEX-P) 37.5 MG tablet One tab by mouth qAM 07/13/17   Silverio Decamp, MD  sildenafil (VIAGRA) 100 MG tablet TAKE 1 TABLET BY MOUTH AS NEEDED FOR ERECTILE DYSFUNCTION *MAX DOSE IS 1 TABLET PER DAY* 04/18/17   Silverio Decamp, MD    Family History Family History  Adopted: Yes  Problem Relation Age of Onset  . Healthy Daughter     Social History Social History   Tobacco Use  . Smoking status: Former Smoker    Packs/day: 1.00    Years: 25.00    Pack years: 25.00    Types: Cigarettes    Last attempt to quit: 2000    Years since quitting: 19.5  . Smokeless tobacco: Current User    Types: Snuff  Substance Use Topics  . Alcohol use: Yes    Alcohol/week: 8.4 oz    Types: 14 Cans of beer per week    Comment: 2 beers QD  . Drug use: No     Allergies   Dilaudid [hydromorphone hcl] and Morphine and related   Review of Systems Review of Systems  Musculoskeletal: Positive for arthralgias, back pain, gait problem and myalgias. Negative for joint swelling.  Skin: Negative for color change, rash and wound.  Neurological: Negative for weakness and numbness.     Physical Exam Triage Vital Signs ED Triage Vitals  Enc Vitals Group     BP 07/24/17 1300 (!) 165/91     Pulse Rate 07/24/17 1300 82     Resp --      Temp 07/24/17 1300 98 F (36.7 C)     Temp Source 07/24/17 1300 Oral     SpO2 07/24/17 1300 97 %     Weight 07/24/17 1301 283 lb (128.4 kg)     Height 07/24/17 1301 6' (1.829 m)     Head Circumference --      Peak Flow --      Pain Score 07/24/17 1300 5     Pain Loc --      Pain Edu? --      Excl. in [place]? --    No data found.  Updated Vital  Signs BP (!) 165/91 (BP Location: Right Arm)   Pulse 82   Temp 98 F (36.7 C) (Oral)   Ht 6' (1.829 m)   Wt 283 lb (128.4 kg)   SpO2 97%   BMI 38.38 kg/m   Visual Acuity Right Eye Distance:   Left Eye Distance:   Bilateral Distance:    Right Eye Near:   Left Eye Near:    Bilateral  Near:     Physical Exam  Constitutional: He is oriented to person, place, and time. He appears well-developed and well-nourished. No distress.  HENT:  Head: Normocephalic and atraumatic.  Eyes: EOM are normal.  Neck: Normal range of motion.  Cardiovascular: Normal rate.  Pulmonary/Chest: Effort normal.  Musculoskeletal: Normal range of motion. He exhibits tenderness. He exhibits no edema.  No midline spinal tenderness. Tenderness to Left lower lumbar muscles and Left groin.  Antalgic gait. Uses cane for assistance.  Neurological: He is alert and oriented to person, place, and time.  Skin: Skin is warm and dry. He is not diaphoretic.  Psychiatric: He has a normal mood and affect. His behavior is normal.  Nursing note and vitals reviewed.    UC Treatments / Results  Labs (all labs ordered are listed, but only abnormal results are displayed) Labs Reviewed - No data to display  EKG None  Radiology Dg Hip Unilat W Or Wo Pelvis 2-3 Views Left  Result Date: 07/24/2017 CLINICAL DATA:  Left hip giving out, causing falls, increasing left hip pain EXAM: DG HIP (WITH OR WITHOUT PELVIS) 2-3V LEFT COMPARISON:  None. FINDINGS: There is moderate degenerative joint disease the left hip for age with considerable loss of joint space and sclerosis with mild spurring. No acute fracture is seen. The right total hip components are in good position on the images obtained. The pelvic rami are intact. The SI joints appear corticated. Hardware for fusion of the lower lumbar spine is noted. The left sacral screw does have lucency surrounding is suggesting possible motion. IMPRESSION: 1. Moderate degenerative joint  disease the left hip. No acute abnormality. 2. Total right hip replacement. 3. The left sacral screw has lucency surrounding is suggesting motion. Electronically Signed   By: Ivar Drape M.D.   On: 07/24/2017 13:39    Procedures Procedures (including critical care time)  Medications Ordered in UC Medications - No data to display  Initial Impression / Assessment and Plan / UC Course  I have reviewed the triage vital signs and the nursing notes.  Pertinent labs & imaging results that were available during my care of the patient were reviewed by me and considered in my medical decision making (see chart for details).     Discussed imaging with pt and Dr. Dianah Field.  Dr. Dianah Field also evaluated the pt and performed a hip joint injection on Left hip in UC. See consult note.   Pt to f/u with Dr. Dianah Field in 1 month.   Final Clinical Impressions(s) / UC Diagnoses   Final diagnoses:  Left hip pain  Osteoarthritis of left hip, unspecified osteoarthritis type   Discharge Instructions   None    ED Prescriptions    None     Controlled Substance Prescriptions [place] Controlled Substance Registry consulted? Not Applicable   Tyrell Antonio 07/24/17 1524

## 2017-07-24 NOTE — ED Triage Notes (Signed)
Yesterday went to the grocery store and left hip collapsed, and fell in the parking lot.

## 2017-07-24 NOTE — Assessment & Plan Note (Addendum)
Left hip joint injection as above, return to see me in 3 to 4 weeks, if relief of pain then we can simply watch his sacral screw, if persistent buttock pain we will need to refer back to neurosurgery.

## 2017-07-26 ENCOUNTER — Telehealth: Payer: Self-pay

## 2017-07-26 NOTE — Telephone Encounter (Signed)
Patient advised of recommendations.  

## 2017-07-26 NOTE — Telephone Encounter (Signed)
We talked about it in the urgent care visit, he has hip osteoarthritis, and yes he may use Celebrex, 200 mg 1 to 2 pills daily.

## 2017-07-26 NOTE — Telephone Encounter (Signed)
Travis Huff called and left a message asking if Dr Dianah Field would review his hip x-ray. He also wants to know if he could restart celebrex for the pain. He was seen in the urgent care.

## 2017-07-30 ENCOUNTER — Other Ambulatory Visit: Payer: Self-pay | Admitting: Sports Medicine

## 2017-07-30 DIAGNOSIS — I10 Essential (primary) hypertension: Secondary | ICD-10-CM

## 2017-07-30 MED FILL — CYCLOBENZAPRINE HCL 10 MG T: 10 | 90 days supply | Qty: 180 | Fill #0

## 2017-07-30 MED FILL — LISINOPRIL-HCTZ 20-25 MG TA: 20-25 | 90 days supply | Qty: 90 | Fill #0

## 2017-07-30 MED FILL — DULoxetine HCL 60 MG CPEP: 60 | 30 days supply | Qty: 30 | Fill #1

## 2017-07-31 DIAGNOSIS — M25552 Pain in left hip: Secondary | ICD-10-CM | POA: Diagnosis not present

## 2017-07-31 DIAGNOSIS — M1612 Unilateral primary osteoarthritis, left hip: Secondary | ICD-10-CM | POA: Diagnosis not present

## 2017-08-01 DIAGNOSIS — M419 Scoliosis, unspecified: Secondary | ICD-10-CM | POA: Diagnosis not present

## 2017-08-01 DIAGNOSIS — G629 Polyneuropathy, unspecified: Secondary | ICD-10-CM | POA: Diagnosis not present

## 2017-08-01 DIAGNOSIS — M545 Low back pain: Secondary | ICD-10-CM | POA: Diagnosis not present

## 2017-08-02 MED FILL — HYDROmorphone HCL 2 MG TABS: 2 | 10 days supply | Qty: 60 | Fill #0

## 2017-08-06 ENCOUNTER — Other Ambulatory Visit (HOSPITAL_COMMUNITY): Payer: Self-pay | Admitting: Neurosurgery

## 2017-08-06 DIAGNOSIS — M5441 Lumbago with sciatica, right side: Secondary | ICD-10-CM

## 2017-08-07 ENCOUNTER — Ambulatory Visit (HOSPITAL_BASED_OUTPATIENT_CLINIC_OR_DEPARTMENT_OTHER)
Admission: RE | Admit: 2017-08-07 | Discharge: 2017-08-07 | Disposition: A | Discharge status: Home / Self Care | Payer: 59 | Source: Ambulatory Visit | Attending: Neurosurgery | Admitting: Neurosurgery

## 2017-08-07 DIAGNOSIS — M48061 Spinal stenosis, lumbar region without neurogenic claudication: Secondary | ICD-10-CM | POA: Diagnosis not present

## 2017-08-07 DIAGNOSIS — M5441 Lumbago with sciatica, right side: Secondary | ICD-10-CM

## 2017-08-07 DIAGNOSIS — R296 Repeated falls: Secondary | ICD-10-CM | POA: Diagnosis not present

## 2017-08-07 DIAGNOSIS — M545 Low back pain: Secondary | ICD-10-CM | POA: Diagnosis not present

## 2017-08-07 DIAGNOSIS — I700 Atherosclerosis of aorta: Secondary | ICD-10-CM | POA: Diagnosis not present

## 2017-08-08 ENCOUNTER — Other Ambulatory Visit: Payer: Self-pay | Admitting: Neurosurgery

## 2017-08-08 DIAGNOSIS — M5416 Radiculopathy, lumbar region: Secondary | ICD-10-CM

## 2017-08-13 ENCOUNTER — Ambulatory Visit: Payer: 59 | Admitting: Sports Medicine

## 2017-08-16 ENCOUNTER — Ambulatory Visit
Admission: RE | Admit: 2017-08-16 | Discharge: 2017-08-16 | Disposition: A | Discharge status: Home / Self Care | Payer: 59 | Source: Ambulatory Visit | Attending: Neurosurgery | Admitting: Neurosurgery

## 2017-08-16 DIAGNOSIS — M545 Low back pain: Secondary | ICD-10-CM | POA: Diagnosis not present

## 2017-08-16 DIAGNOSIS — M5416 Radiculopathy, lumbar region: Secondary | ICD-10-CM

## 2017-08-16 MED ORDER — IOPAMIDOL (ISOVUE-M 200) INJECTION 41%
1.0000 mL | Freq: Once | INTRAMUSCULAR | Status: AC
  Administered 2017-08-16: 1 mL via EPIDURAL

## 2017-08-16 MED ORDER — METHYLPREDNISOLONE ACETATE 40 MG/ML INJ SUSP (RADIOLOG
120.0000 mg | Freq: Once | INTRAMUSCULAR | Status: AC
  Administered 2017-08-16: 120 mg via EPIDURAL

## 2017-08-16 NOTE — Discharge Instructions (Signed)

## 2017-08-21 ENCOUNTER — Other Ambulatory Visit: Payer: 59

## 2017-09-05 MED FILL — PREGABALIN 75 MG CAPS: 75 | 30 days supply | Qty: 60 | Fill #0

## 2017-09-10 ENCOUNTER — Ambulatory Visit (INDEPENDENT_AMBULATORY_CARE_PROVIDER_SITE_OTHER): Payer: 59 | Admitting: Physician Assistant

## 2017-09-10 ENCOUNTER — Encounter: Payer: Self-pay | Admitting: Physician Assistant

## 2017-09-10 VITALS — BP 165/107 | HR 71 | Temp 97.9°F | Wt 286.0 lb

## 2017-09-10 DIAGNOSIS — H9192 Unspecified hearing loss, left ear: Secondary | ICD-10-CM | POA: Diagnosis not present

## 2017-09-10 DIAGNOSIS — R42 Dizziness and giddiness: Secondary | ICD-10-CM

## 2017-09-10 DIAGNOSIS — R251 Tremor, unspecified: Secondary | ICD-10-CM

## 2017-09-10 NOTE — Patient Instructions (Addendum)
Make your neurologist aware of your vertigo and tremor Perform Epley maneuver on your bed at home. Recommend doing this on your left side in particular Follow-up with PT for vestibular rehab  How to Perform the Epley Maneuver The Epley maneuver is an exercise that relieves symptoms of vertigo. Vertigo is the feeling that you or your surroundings are moving when they are not. When you feel vertigo, you may feel like the room is spinning and have trouble walking. Dizziness is a little different than vertigo. When you are dizzy, you may feel unsteady or light-headed. You can do this maneuver at home whenever you have symptoms of vertigo. You can do it up to 3 times a day until your symptoms go away. Even though the Epley maneuver may relieve your vertigo for a few weeks, it is possible that your symptoms will return. This maneuver relieves vertigo, but it does not relieve dizziness. What are the risks? If it is done correctly, the Epley maneuver is considered safe. Sometimes it can lead to dizziness or nausea that goes away after a short time. If you develop other symptoms, such as changes in vision, weakness, or numbness, stop doing the maneuver and call your health care provider. How to perform the Epley maneuver 1. Sit on the edge of a bed or table with your back straight and your legs extended or hanging over the edge of the bed or table. 2. Turn your head halfway toward the affected ear or side. 3. Lie backward quickly with your head turned until you are lying flat on your back. You may want to position a pillow under your shoulders. 4. Hold this position for 30 seconds. You may experience an attack of vertigo. This is normal. 5. Turn your head to the opposite direction until your unaffected ear is facing the floor. 6. Hold this position for 30 seconds. You may experience an attack of vertigo. This is normal. Hold this position until the vertigo stops. 7. Turn your whole body to the same side as  your head. Hold for another 30 seconds. 8. Sit back up. You can repeat this exercise up to 3 times a day. Follow these instructions at home:  After doing the Epley maneuver, you can return to your normal activities.  Ask your health care provider if there is anything you should do at home to prevent vertigo. He or she may recommend that you: ? Keep your head raised (elevated) with two or more pillows while you sleep. ? Do not sleep on the side of your affected ear. ? Get up slowly from bed. ? Avoid sudden movements during the day. ? Avoid extreme head movement, like looking up or bending over. Contact a health care provider if:  Your vertigo gets worse.  You have other symptoms, including: ? Nausea. ? Vomiting. ? Headache. Get help right away if:  You have vision changes.  You have a severe or worsening headache or neck pain.  You cannot stop vomiting.  You have new numbness or weakness in any part of your body. Summary  Vertigo is the feeling that you or your surroundings are moving when they are not.  The Epley maneuver is an exercise that relieves symptoms of vertigo.  If the Epley maneuver is done correctly, it is considered safe. You can do it up to 3 times a day. This information is not intended to replace advice given to you by your health care provider. Make sure you discuss any questions you have  with your health care provider. Document Released: 12/24/2012 Document Revised: 11/09/2015 Document Reviewed: 11/09/2015 Elsevier Interactive Patient Education  2017 [employer].

## 2017-09-10 NOTE — Progress Notes (Signed)
HPI:                                                                Travis Huff is a 57 y.o. male who presents to [facility] today for dizziness  For the last 6 days he has had intermittent dizziness associated with left-sided ear fullness and diminished hearing Reports he has woken up in the middle of the night on several occasions with dizziness that he describes as "feeling like I am being ejected from bed." He has also had dizziness with position changes in bed and forward head movements. Episodes are brief, lasting 30 seconds and resolve spontaneously No tinnitus, no otalgia No vision change, nausea, vomiting, headache, focal weakness, lightheadedness or syncope Reports he has chronic balance issues due to his spinal stenosis and has completed PT for this. No new gait disturbance or falls. Reports history of vertigo 5 years ago  Past Medical History:  Diagnosis Date  . Arthritis   . Asthma   . GERD (gastroesophageal reflux disease)    uses tums  . Hypertension   . Viral pericarditis    Past Surgical History:  Procedure Laterality Date  . ABDOMINAL EXPOSURE N/A 06/23/2016   Procedure: ABDOMINAL EXPOSURE;  Surgeon: Rosetta Posner, MD;  Location: [facility];  Service: Vascular;  Laterality: N/A;  . ANTERIOR LATERAL LUMBAR FUSION 4 LEVELS Right 06/23/2016   Procedure: Right  Lumbar two-three Lumbar three-four Lumbar four-five Anterolateral lumbar interbody fusion;  Surgeon: Erline Levine, MD;  Location: [place];  Service: Neurosurgery;  Laterality: Right;  . ANTERIOR LUMBAR FUSION N/A 06/23/2016   Procedure: Lumbar five -sacral one  Anterior lumbar interbody fusion with Dr. Sherren Mocha Early for approach;  Surgeon: Erline Levine, MD;  Location: [facility];  Service: Neurosurgery;  Laterality: N/A;  . APPLICATION OF INTRAOPERATIVE CT SCAN N/A 06/26/2016   Procedure: APPLICATION OF INTRAOPERATIVE CT SCAN;  Surgeon: Erline Levine, MD;  Location: [place];   Service: Neurosurgery;  Laterality: N/A;  . CARDIOVASCULAR STRESS TEST     Negative in May 2014  . COLONOSCOPY    . HERNIA REPAIR    . JOINT REPLACEMENT    . KNEE SURGERY    . POSTERIOR LUMBAR FUSION 4 LEVEL N/A 06/26/2016   Procedure: Thoracic ten to Pelvis fixation with AIRO;  Surgeon: Erline Levine, MD;  Location: [place];  Service: Neurosurgery;  Laterality: N/A;  . TENDON REPAIR     right hand  . TOTAL HIP ARTHROPLASTY Right 05/25/2014   Procedure: RIGHT TOTAL HIP ARTHROPLASTY ANTERIOR APPROACH;  Surgeon: Rod Can, MD;  Location: [facility];  Service: Orthopedics;  Laterality: Right;  . TOTAL KNEE ARTHROPLASTY Bilateral    Social History   Tobacco Use  . Smoking status: Former Smoker    Packs/day: 1.00    Years: 25.00    Pack years: 25.00    Types: Cigarettes    Last attempt to quit: 2000    Years since quitting: 19.7  . Smokeless tobacco: Current User    Types: Snuff  Substance Use Topics  . Alcohol use: Yes    Alcohol/week: 14.0 standard drinks    Types: 14 Cans of beer per week    Comment: 2 beers QD   family  history includes Healthy in his daughter. He was adopted.    ROS: negative except as noted in the HPI  Medications: Current Outpatient Medications  Medication Sig Dispense Refill  . BYSTOLIC 10 MG tablet TAKE 1 TABLET (10 MG TOTAL) BY MOUTH DAILY. 90 tablet 3  . calcium carbonate (TUMS - DOSED IN MG ELEMENTAL CALCIUM) 500 MG chewable tablet Chew 3-4 tablets by mouth at bedtime as needed (stomach cramps).    . cyclobenzaprine (FLEXERIL) 10 MG tablet Take 2 tablets (20 mg total) by mouth at bedtime. 180 tablet 3  . DULoxetine (CYMBALTA) 60 MG capsule TAKE 1 CAPSULE (60 MG TOTAL) BY MOUTH DAILY. 30 capsule 3  . DULoxetine 40 MG CPEP Take 40 mg by mouth daily. 30 capsule 3  . lisinopril-hydrochlorothiazide (PRINZIDE,ZESTORETIC) 20-25 MG tablet TAKE 1 TABLET BY MOUTH DAILY. 90 tablet 3  . sildenafil (VIAGRA) 100 MG tablet TAKE 1 TABLET BY MOUTH AS NEEDED FOR  ERECTILE DYSFUNCTION *MAX DOSE IS 1 TABLET PER DAY* 10 tablet 11   No current facility-administered medications for this visit.    Allergies  Allergen Reactions  . Dilaudid [Hydromorphone Hcl] Itching and Other (See Comments)    Couldn't sleep  . Morphine And Related Itching       Objective:  BP (!) 165/107   Pulse 71   Temp 97.9 F (36.6 C) (Oral)   Wt 286 lb (129.7 kg)   BMI 38.79 kg/m  Gen: well-groomed, not ill-appearing, no acute distress, obese male HEENT: head normocephalic, atraumatic; conjunctiva and cornea clear, oropharynx clear, moist mucus membranes; neck supple, no meningeal signs Pulm: Normal work of breathing, normal phonation Neuro:  cranial nerves II-XII intact, no nystagmus with eye movements or HINTS, unable to perform [employer] due to back pain, normal finger-to-nose, normal heel-to-shin,  normal rapid alternating movements, normal tone, bilateral resting tremor,  MSK: , antalgic gait, ambulating with a cane Mental Status: alert and oriented x 3, speech articulate, and thought processes clear and goal-directed   Orthostatic VS for the past 24 hrs (Last 3 readings):  BP- Lying Pulse- Lying BP- Sitting Pulse- Sitting BP- Standing at 0 minutes Pulse- Standing at 0 minutes  09/10/17 1624 144/90 74 (!) 150/93 74 (!) 142/92 76     No results found for this or any previous visit (from the past 72 hour(s)). No results found.    Assessment and Plan: 57 y.o. male with   .Diagnoses and all orders for this visit:  Vertigo -     Ambulatory referral to Physical Therapy  Tremor  High frequency hearing loss, left   - Reassuring neuro exam. No orthostasis. There is left-sided hearing loss at 4000 Hz, unclear if this represents a change in baseline or is chronic. Vertiginous symptoms are positional and brief. I think BPPV is the most likely diagnosis. Instructed on how to perform Epley maneuver. Referring to vestibular rehab.  - Tremor was noted. Patient  states this has been present for several years, seems to be worsening. Recommended he inform his neurologist.  Patient education and anticipatory guidance given Patient agrees with treatment plan Follow-up as needed if symptoms worsen or fail to improve  Darlyne Russian PA-C

## 2017-09-11 MED FILL — BYSTOLIC 10 MG TABLET: 10 | 90 days supply | Qty: 90 | Fill #3

## 2017-09-11 MED FILL — DULoxetine HCL 60 MG CPEP: 60 | 30 days supply | Qty: 30 | Fill #2

## 2017-09-12 DIAGNOSIS — M545 Low back pain: Secondary | ICD-10-CM | POA: Diagnosis not present

## 2017-09-12 DIAGNOSIS — G629 Polyneuropathy, unspecified: Secondary | ICD-10-CM | POA: Diagnosis not present

## 2017-09-12 DIAGNOSIS — M419 Scoliosis, unspecified: Secondary | ICD-10-CM | POA: Diagnosis not present

## 2017-09-14 ENCOUNTER — Ambulatory Visit: Payer: 59 | Admitting: Physical Therapy

## 2017-09-14 ENCOUNTER — Encounter: Payer: Self-pay | Admitting: Physical Therapy

## 2017-09-14 DIAGNOSIS — R42 Dizziness and giddiness: Secondary | ICD-10-CM

## 2017-09-14 DIAGNOSIS — H8113 Benign paroxysmal vertigo, bilateral: Secondary | ICD-10-CM | POA: Diagnosis not present

## 2017-09-14 NOTE — Patient Instructions (Signed)
Access Code: [email or web address]  URL: [email or web address]  Date: 09/14/2017  Prepared by: Faustino Congress   Patient Education  BPPV

## 2017-09-14 NOTE — Therapy (Signed)
[facility] [place], [place], [address] Phone: [phone]   Fax:  [phone]  Physical Therapy Evaluation  Patient Details  Name: Travis Huff MRN: [id number] Date of Birth: 1960-11-21 Referring Provider: Trixie Dredge, [place]   Encounter Date: 09/14/2017  PT End of Session - 09/14/17 1149    Visit Number  1    Number of Visits  6    Date for PT Re-Evaluation  10/26/17    Authorization Type  [place] [employer]    PT Start Time  1116    PT Stop Time  1145    PT Time Calculation (min)  29 min    Activity Tolerance  Patient tolerated treatment well    Behavior During Therapy  [facility] for tasks assessed/performed       Past Medical History:  Diagnosis Date  . Arthritis   . Asthma   . GERD (gastroesophageal reflux disease)    uses tums  . Hypertension   . Viral pericarditis     Past Surgical History:  Procedure Laterality Date  . ABDOMINAL EXPOSURE N/A 06/23/2016   Procedure: ABDOMINAL EXPOSURE;  Surgeon: Rosetta Posner, MD;  Location: [place];  Service: Vascular;  Laterality: N/A;  . ANTERIOR LATERAL LUMBAR FUSION 4 LEVELS Right 06/23/2016   Procedure: Right  Lumbar two-three Lumbar three-four Lumbar four-five Anterolateral lumbar interbody fusion;  Surgeon: Erline Levine, MD;  Location: [facility];  Service: Neurosurgery;  Laterality: Right;  . ANTERIOR LUMBAR FUSION N/A 06/23/2016   Procedure: Lumbar five -sacral one  Anterior lumbar interbody fusion with Dr. Sherren Mocha Early for approach;  Surgeon: Erline Levine, MD;  Location: [place];  Service: Neurosurgery;  Laterality: N/A;  . APPLICATION OF INTRAOPERATIVE CT SCAN N/A 06/26/2016   Procedure: APPLICATION OF INTRAOPERATIVE CT SCAN;  Surgeon: Erline Levine, MD;  Location: [facility];  Service: Neurosurgery;  Laterality: N/A;  . CARDIOVASCULAR STRESS TEST     Negative in May 2014  . COLONOSCOPY    . HERNIA REPAIR    . JOINT REPLACEMENT    . KNEE SURGERY     . POSTERIOR LUMBAR FUSION 4 LEVEL N/A 06/26/2016   Procedure: Thoracic ten to Pelvis fixation with AIRO;  Surgeon: Erline Levine, MD;  Location: [place];  Service: Neurosurgery;  Laterality: N/A;  . TENDON REPAIR     right hand  . TOTAL HIP ARTHROPLASTY Right 05/25/2014   Procedure: RIGHT TOTAL HIP ARTHROPLASTY ANTERIOR APPROACH;  Surgeon: Rod Can, MD;  Location: [facility];  Service: Orthopedics;  Laterality: Right;  . TOTAL KNEE ARTHROPLASTY Bilateral     There were no vitals filed for this visit.   Subjective Assessment - 09/14/17 1119    Subjective  Pt is a 57 y/o male who presents to OPPT for vertigo.  Pt reports symptoms began 2 years ago with spinning sensation, which resolved and returned last week.  Pt states continued episodes of vertigo with rolling and lying down.    Patient Stated Goals  stop dizziness    Currently in Pain?  No/denies         [facility] PT Assessment - 09/14/17 1121      Assessment   Medical Diagnosis  vertigo    Referring Provider  Trixie Dredge, PA-C    Onset Date/Surgical Date  --   1-2 weeks ago   Next MD Visit  PRN      Precautions   Precautions  None      Restrictions  Weight Bearing Restrictions  No      Balance Screen   Has the patient fallen in the past 6 months  Yes    How many times?  3 - due to neuropathy    Has the patient had a decrease in activity level because of a fear of falling?   Yes    Is the patient reluctant to leave their home because of a fear of falling?   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, moderate lifting    Leisure  work in yard,  travel      Ambulation/Gait   Gait Comments  amb with wide BOS and SPC due to neuropathy; at baseline           Vestibular Assessment - 09/14/17 1124      Vestibular Assessment   General Observation  no dizziness at rest      Symptom Behavior   Type of Dizziness  Spinning    Frequency of Dizziness  lying down or rolling over    Duration of Dizziness  15 sec    Aggravating Factors  Lying supine;Rolling to right;Rolling to left    Relieving Factors  Head stationary   waits for spinning to stop     Occulomotor Exam   Occulomotor Alignment  Normal    Spontaneous  Absent    Gaze-induced  Absent    Smooth Pursuits  Intact    Saccades  Intact      Vestibulo-Occular Reflex   VOR 1 Head Only (x 1 viewing)  WNL    Comment  HIT (-) bil      Positional Testing   Dix-Hallpike  Dix-Hallpike Right;Dix-Hallpike Left    Horizontal Canal Testing  Horizontal Canal Right;Horizontal Canal Left      Dix-Hallpike Right   Dix-Hallpike Right Duration  8 sec    Dix-Hallpike Right Symptoms  Right nystagmus   geotropic     Dix-Hallpike Left   Dix-Hallpike Left Duration  8 sec    Dix-Hallpike Left Symptoms  Left nystagmus   geotropic     Horizontal Canal Right   Horizontal Canal Right Duration  8 sec    Horizontal Canal Right Symptoms  Geotrophic      Horizontal Canal Left   Horizontal Canal Left Duration  5 sec    Horizontal Canal Left Symptoms  Geotrophic          Objective measurements completed on examination: See above findings.       Vestibular Treatment/Exercise - 09/14/17 1148      Vestibular Treatment/Exercise   Vestibular Treatment Provided  Canalith Repositioning    Canalith Repositioning  Canal Roll Right      Canal Roll Right   Number of Reps   1    Overall Response   Symptoms Resolved    Response Details   no sympmtoms on Rt or Lt following horizontal roll            PT Education - 09/14/17 1149    Education Details  BPPV    Person(s) Educated   Patient    Methods  Explanation;Handout    Comprehension  Verbalized understanding          PT Long Term Goals - 09/14/17 1154      PT LONG TERM GOAL #1   Title  demonstrate negative positional testing    Status  New    Target Date  10/26/17      PT LONG TERM GOAL #2   Title  independent with HEP if indicated    Status  New    Target Date  10/26/17      PT LONG TERM GOAL #3   Title  n/a      PT LONG TERM GOAL #4   Title  n/a             Plan - 09/14/17 1149    Clinical Impression Statement  Pt is a 57 y/o male who presents to OPPT for vertigo x 2 weeks. Clinical finding consistent with horizontal canal BPPV, worse on Rt with positive symptoms initially on Lt.  Treated with Rt horizontal roll today with resolve of bil nystagmus and symptoms.  Dix-Hallpike positive for geotropic nystagmus which could indicate possible alight anatomical malalignment.  Will benefit from PT to address deficits.    History and Personal Factors relevant to plan of care:  OA, asthma, HTN, neuropathy, ALIF, Rt THA, bil TKA    Clinical Presentation  Stable    Clinical Decision Making  Low    Rehab Potential  Good    PT Frequency  1x / week    PT Duration  6 weeks    PT Treatment/Interventions  ADLs/Self Care Home Management;Canalith Repostioning;Cryotherapy;Electrical Stimulation;Moist Heat;Therapeutic exercise;Therapeutic activities;Functional mobility training;Stair training;Gait training;Balance training;Neuromuscular re-education;Patient/family education;Vestibular    PT Next Visit Plan  reassess canals and tx as indicated, give habituation PRN    PT Home Exercise Plan  Access Code: [email or web address]     Consulted and Agree with Plan of Care  Patient       Patient will benefit from skilled therapeutic intervention in order to improve the following deficits and impairments:  Abnormal gait, Dizziness  Visit Diagnosis: BPPV (benign paroxysmal positional vertigo), bilateral - Plan: PT plan of care  cert/re-cert  Dizziness and giddiness - Plan: PT plan of care cert/re-cert     Problem List Patient Active Problem List   Diagnosis Date Noted  . High frequency hearing loss, left 09/10/2017  . Tremor 09/10/2017  . Vertigo 09/10/2017  . Mixed hyperlipidemia 04/18/2017  . Elevated CK 04/18/2017  . Open dislocation of left second PIP of the foot 11/29/2016  . Scoliosis of thoracolumbar spine 06/23/2016  . Headache 06/20/2016  . Anxiety, generalized 05/11/2016  . Primary osteoarthritis of right hip 05/25/2014  . Benign essential hypertension 03/26/2014  . Spinal stenosis, lumbar region, with neurogenic claudication 12/21/2013  . Primary osteoarthritis of left hip 07/07/2013  . Morbid obesity ([place]) 08/01/2012  . Precordial pain 05/24/2012  . S/P total knee arthroplasty 01/26/2012  . Erectile dysfunction 01/26/2012  . Annual physical exam 01/26/2012     Laureen Abrahams, PT, DPT 09/14/17 11:56 AM    [facility] [address] [place] [place], [place], [address] Phone: [phone]   Fax:  [phone]  Name: Travis Huff MRN: [id number] Date of Birth: 06/14/1960

## 2017-09-18 ENCOUNTER — Encounter: Payer: 59 | Admitting: Physical Therapy

## 2017-09-20 ENCOUNTER — Other Ambulatory Visit: Payer: Self-pay | Admitting: Sports Medicine

## 2017-09-20 DIAGNOSIS — M545 Low back pain: Secondary | ICD-10-CM | POA: Diagnosis not present

## 2017-09-20 DIAGNOSIS — M419 Scoliosis, unspecified: Secondary | ICD-10-CM | POA: Diagnosis not present

## 2017-09-20 MED FILL — CELECOXIB 200 MG CAP: 200 | 90 days supply | Qty: 180 | Fill #0

## 2017-09-27 ENCOUNTER — Encounter: Payer: 59 | Admitting: Physical Therapy

## 2017-10-10 MED FILL — DULoxetine HCL 60 MG CPEP: 60 | 30 days supply | Qty: 30 | Fill #3

## 2017-10-10 MED FILL — GABAPENTIN 300 MG CAPSULE: 300 | 30 days supply | Qty: 90 | Fill #1

## 2017-10-12 MED FILL — HYDROmorphone HCL 2 MG TABS: 2 | 10 days supply | Qty: 60 | Fill #0

## 2017-10-25 ENCOUNTER — Encounter: Payer: Self-pay | Admitting: Sports Medicine

## 2017-10-25 ENCOUNTER — Ambulatory Visit (INDEPENDENT_AMBULATORY_CARE_PROVIDER_SITE_OTHER): Payer: 59 | Admitting: Sports Medicine

## 2017-10-25 ENCOUNTER — Ambulatory Visit (INDEPENDENT_AMBULATORY_CARE_PROVIDER_SITE_OTHER): Payer: 59

## 2017-10-25 DIAGNOSIS — M4802 Spinal stenosis, cervical region: Secondary | ICD-10-CM | POA: Diagnosis not present

## 2017-10-25 DIAGNOSIS — R252 Cramp and spasm: Secondary | ICD-10-CM | POA: Insufficient documentation

## 2017-10-25 DIAGNOSIS — M50322 Other cervical disc degeneration at C5-C6 level: Secondary | ICD-10-CM | POA: Diagnosis not present

## 2017-10-25 DIAGNOSIS — M1612 Unilateral primary osteoarthritis, left hip: Secondary | ICD-10-CM

## 2017-10-25 DIAGNOSIS — F411 Generalized anxiety disorder: Secondary | ICD-10-CM | POA: Diagnosis not present

## 2017-10-25 MED ORDER — ATENOLOL 25 MG PO TABS
25.0000 mg | ORAL_TABLET | Freq: Every day | ORAL | 3 refills | Status: DC
Start: 2017-10-25 — End: 2017-11-21

## 2017-10-25 MED FILL — ATENOLOL 25 MG TABLET: 25 | 90 days supply | Qty: 90 | Fill #0

## 2017-10-25 NOTE — Progress Notes (Signed)
Subjective:    CC: Hip pain  HPI: This is a pleasant 57 year old male, left hip has been hurting for the past couple of weeks.  Moderate, persistent, localized in the groin, worse with weightbearing.  Previous injection was 3 months ago.  In addition he complains of cramps in both legs, he is status post multilevel thoracolumbar fusion.  Understands he needs to follow-up with his neurosurgeon for this.  Hand cramping: With neck pain.  No overt paresthesias into the arms or hands.  No progressive weakness, no trauma, constitutional symptoms.  I reviewed the past medical history, family history, social history, surgical history, and allergies today and no changes were needed.  Please see the problem list section below in epic for further details.  Past Medical History: Past Medical History:  Diagnosis Date  . Arthritis   . Asthma   . GERD (gastroesophageal reflux disease)    uses tums  . Hypertension   . Viral pericarditis    Past Surgical History: Past Surgical History:  Procedure Laterality Date  . ABDOMINAL EXPOSURE N/A 06/23/2016   Procedure: ABDOMINAL EXPOSURE;  Surgeon: Rosetta Posner, MD;  Location: [facility];  Service: Vascular;  Laterality: N/A;  . ANTERIOR LATERAL LUMBAR FUSION 4 LEVELS Right 06/23/2016   Procedure: Right  Lumbar two-three Lumbar three-four Lumbar four-five Anterolateral lumbar interbody fusion;  Surgeon: Erline Levine, MD;  Location: [facility];  Service: Neurosurgery;  Laterality: Right;  . ANTERIOR LUMBAR FUSION N/A 06/23/2016   Procedure: Lumbar five -sacral one  Anterior lumbar interbody fusion with Dr. Sherren Mocha Early for approach;  Surgeon: Erline Levine, MD;  Location: [facility];  Service: Neurosurgery;  Laterality: N/A;  . APPLICATION OF INTRAOPERATIVE CT SCAN N/A 06/26/2016   Procedure: APPLICATION OF INTRAOPERATIVE CT SCAN;  Surgeon: Erline Levine, MD;  Location: [facility];  Service: Neurosurgery;  Laterality: N/A;  . CARDIOVASCULAR STRESS TEST     Negative in May 2014    . COLONOSCOPY    . HERNIA REPAIR    . JOINT REPLACEMENT    . KNEE SURGERY    . POSTERIOR LUMBAR FUSION 4 LEVEL N/A 06/26/2016   Procedure: Thoracic ten to Pelvis fixation with AIRO;  Surgeon: Erline Levine, MD;  Location: [facility];  Service: Neurosurgery;  Laterality: N/A;  . TENDON REPAIR     right hand  . TOTAL HIP ARTHROPLASTY Right 05/25/2014   Procedure: RIGHT TOTAL HIP ARTHROPLASTY ANTERIOR APPROACH;  Surgeon: Rod Can, MD;  Location: [facility];  Service: Orthopedics;  Laterality: Right;  . TOTAL KNEE ARTHROPLASTY Bilateral    Social History: Social History   Socioeconomic History  . Marital status: Married    Spouse name: Not on file  . Number of children: 2  . Years of education: 3  . Highest education level: Not on file  Occupational History  . Occupation: Public relations account executive  Social Needs  . Financial resource strain: Not on file  . Food insecurity:    Worry: Not on file    Inability: Not on file  . Transportation needs:    Medical: Not on file    Non-medical: Not on file  Tobacco Use  . Smoking status: Former Smoker    Packs/day: 1.00    Years: 25.00    Pack years: 25.00    Types: Cigarettes    Last attempt to quit: 2000    Years since quitting: 19.8  . Smokeless tobacco: Current User    Types: Snuff  Substance and Sexual Activity  . Alcohol use:  Yes    Alcohol/week: 14.0 standard drinks    Types: 14 Cans of beer per week    Comment: 2 beers QD  . Drug use: No  . Sexual activity: Not on file  Lifestyle  . Physical activity:    Days per week: Not on file    Minutes per session: Not on file  . Stress: Not on file  Relationships  . Social connections:    Talks on phone: Not on file    Gets together: Not on file    Attends religious service: Not on file    Active member of club or organization: Not on file    Attends meetings of clubs or organizations: Not on file    Relationship status: Not on file  Other Topics Concern  . Not on file  Social History  Narrative   Lives with wife in a one story home.  Has 2 children.  Works as a Public relations account executive.  Education: high school.    Family History: Family History  Adopted: Yes  Problem Relation Age of Onset  . Healthy Daughter    Allergies: Allergies  Allergen Reactions  . Dilaudid [Hydromorphone Hcl] Itching and Other (See Comments)    Couldn't sleep  . Morphine And Related Itching   Medications: See med rec.  Review of Systems: No fevers, chills, night sweats, weight loss, chest pain, or shortness of breath.   Objective:    General: Well Developed, well nourished, and in no acute distress.  Neuro: Alert and oriented x3, extra-ocular muscles intact, sensation grossly intact.  HEENT: Normocephalic, atraumatic, pupils equal round reactive to light, neck supple, no masses, no lymphadenopathy, thyroid nonpalpable.  Skin: Warm and dry, no rashes. Cardiac: Regular rate and rhythm, no murmurs rubs or gallops, no lower extremity edema.  Respiratory: Clear to auscultation bilaterally. Not using accessory muscles, speaking in full sentences.  Procedure: Real-time Ultrasound Guided Injection of left hip joint Device: GE Logiq E  Verbal informed consent obtained.  Time-out conducted.  Noted no overlying erythema, induration, or other signs of local infection.  Skin prepped in a sterile fashion.  Local anesthesia: Topical Ethyl chloride.  With sterile technique and under real time ultrasound guidance: 22-gauge spinal needle advanced to the femoral head/neck junction, contacted bone and injected 1 cc kenalog 40, 2 cc lidocaine, 2 cc bupivacaine. Completed without difficulty  Pain immediately resolved suggesting accurate placement of the medication.  Advised to call if fevers/chills, erythema, induration, drainage, or persistent bleeding.  Images permanently stored and available for review in the ultrasound unit.  Impression: Technically successful ultrasound guided injection.  Impression and  Recommendations:    Primary osteoarthritis of left hip Injection as above, previous injection was 3 months ago.  Anxiety, generalized Continue Cymbalta, patient is unaware of what dose he is on. I am going to add low-dose atenolol 25 mg as he is having significant panic with adrenergic symptoms such as sweating and tachycardia.  Cramping of hands I think some of this is likely coming from his cervical spine. He will continue to work with his neurologist and neurosurgeon with regards to his lumbar spine. Adding cervical spine x-rays, cervical rehabilitation exercises, return in 1 month. ___________________________________________ Gwen Her. Dianah Field, M.D., ABFM., CAQSM. Primary Care and Sports Medicine [facility]  Adjunct Professor of [place] [employer]

## 2017-10-25 NOTE — Assessment & Plan Note (Signed)
Continue Cymbalta, patient is unaware of what dose he is on. I am going to add low-dose atenolol 25 mg as he is having significant panic with adrenergic symptoms such as sweating and tachycardia.

## 2017-10-25 NOTE — Assessment & Plan Note (Signed)
I think some of this is likely coming from his cervical spine. He will continue to work with his neurologist and neurosurgeon with regards to his lumbar spine. Adding cervical spine x-rays, cervical rehabilitation exercises, return in 1 month.

## 2017-10-25 NOTE — Assessment & Plan Note (Signed)
Injection as above, previous injection was 3 months ago.

## 2017-10-26 ENCOUNTER — Telehealth: Payer: Self-pay | Admitting: Sports Medicine

## 2017-10-26 NOTE — Telephone Encounter (Signed)
Patient calls this morning and his wife wanted him to call and let you know he is taking Lisinopril and Bystolic for his Blood Pressure and its good and just wanted you to know that before he starts the Atenolol you gave him for nerves? Please advise

## 2017-10-26 NOTE — Telephone Encounter (Signed)
Pt notified of MD instructions. Verbalized understanding and will let know if works. KG LPN

## 2017-10-26 NOTE — Telephone Encounter (Signed)
Yes, it is a very low dose of atenolol, but he will certainly need to watch his heart rate and blood pressure when taking it.  If it does work for the sweating and panic episodes then we may just need to decrease the dose of Bystolic to 5 mg.

## 2017-10-31 DIAGNOSIS — Z6838 Body mass index (BMI) 38.0-38.9, adult: Secondary | ICD-10-CM | POA: Diagnosis not present

## 2017-10-31 DIAGNOSIS — M419 Scoliosis, unspecified: Secondary | ICD-10-CM | POA: Diagnosis not present

## 2017-10-31 DIAGNOSIS — M545 Low back pain: Secondary | ICD-10-CM | POA: Diagnosis not present

## 2017-10-31 DIAGNOSIS — M5416 Radiculopathy, lumbar region: Secondary | ICD-10-CM | POA: Diagnosis not present

## 2017-10-31 DIAGNOSIS — I10 Essential (primary) hypertension: Secondary | ICD-10-CM | POA: Diagnosis not present

## 2017-10-31 MED FILL — CYCLOBENZAPRINE HCL 10 MG T: 10 | 90 days supply | Qty: 180 | Fill #1

## 2017-11-07 ENCOUNTER — Telehealth: Payer: Self-pay | Admitting: Neurology

## 2017-11-07 ENCOUNTER — Telehealth: Payer: Self-pay

## 2017-11-07 NOTE — Telephone Encounter (Signed)
Travis Huff please follow up.

## 2017-11-07 NOTE — Telephone Encounter (Signed)
Patient called and left a vm needing a letter from the Doctor. Please call him back at [phone]. Thanks!

## 2017-11-07 NOTE — Telephone Encounter (Signed)
Travis Huff called and left a message asking for a letter in summary of all his health problems. He is going to speak with a disability lawyer next week. On the recommendations of Dr Boykin [facility]. Please advise.

## 2017-11-08 NOTE — Telephone Encounter (Signed)
Maybe start by just printing out his problem list.

## 2017-11-08 NOTE — Telephone Encounter (Signed)
Please advise 

## 2017-11-08 NOTE — Telephone Encounter (Signed)
Patient called back regarding needing a letter for his [place]. He has letters from Dr. [name] and his PCP. He is needing a letter that explains his Neuropathy and why he is disabled from the neuropathy? Thanks

## 2017-11-08 NOTE — Telephone Encounter (Signed)
Letter up front. Patient is aware.

## 2017-11-08 NOTE — Telephone Encounter (Signed)
Patient can request my office notes through medical records.

## 2017-11-09 NOTE — Telephone Encounter (Signed)
Travis Huff called back and would like Dr Dianah Field to write a letter stating that you believe he needs to go out on disability.

## 2017-11-12 ENCOUNTER — Telehealth: Payer: Self-pay | Admitting: Neurology

## 2017-11-12 NOTE — Telephone Encounter (Signed)
Letter in box. 

## 2017-11-12 NOTE — Telephone Encounter (Signed)
Patient called and left a vm about Dr.Aquino writing a letter for his lawyer stating the facts of his EMG back in May for disability. He was calling to check the status of this. Please call him back at [phone]. Thanks!

## 2017-11-13 ENCOUNTER — Other Ambulatory Visit: Payer: Self-pay

## 2017-11-13 ENCOUNTER — Emergency Department (INDEPENDENT_AMBULATORY_CARE_PROVIDER_SITE_OTHER): Payer: 59

## 2017-11-13 ENCOUNTER — Emergency Department
Admission: EM | Admit: 2017-11-13 | Discharge: 2017-11-13 | Disposition: A | Discharge status: Home / Self Care | Payer: 59 | Source: Home / Self Care | Attending: Family Medicine | Admitting: Family Medicine

## 2017-11-13 DIAGNOSIS — S6992XA Unspecified injury of left wrist, hand and finger(s), initial encounter: Secondary | ICD-10-CM

## 2017-11-13 DIAGNOSIS — W010XXA Fall on same level from slipping, tripping and stumbling without subsequent striking against object, initial encounter: Secondary | ICD-10-CM

## 2017-11-13 DIAGNOSIS — M25532 Pain in left wrist: Secondary | ICD-10-CM | POA: Diagnosis not present

## 2017-11-13 DIAGNOSIS — M19042 Primary osteoarthritis, left hand: Secondary | ICD-10-CM

## 2017-11-13 DIAGNOSIS — W19XXXA Unspecified fall, initial encounter: Secondary | ICD-10-CM

## 2017-11-13 DIAGNOSIS — M79642 Pain in left hand: Secondary | ICD-10-CM | POA: Diagnosis not present

## 2017-11-13 DIAGNOSIS — M7989 Other specified soft tissue disorders: Secondary | ICD-10-CM | POA: Diagnosis not present

## 2017-11-13 NOTE — Telephone Encounter (Signed)
Left message instructing patient to call medical records.  Numbers given.

## 2017-11-13 NOTE — ED Triage Notes (Signed)
Late yesterday afternoon, pt was pushing trash can out to the curb, hit a rock, and the can fell over.  Pt landed on left hand, and has pain in the wrist, and thumb.

## 2017-11-13 NOTE — Discharge Instructions (Signed)
°  You may wear the a hand/wrist splint for comfort.  It is recommended that you keep your appointment with Sports Medicine tomorrow for further evaluation and treatment of your hand and wrist pain.

## 2017-11-13 NOTE — Telephone Encounter (Signed)
See previous note

## 2017-11-13 NOTE — ED Provider Notes (Signed)
[facility]    CSN: [id number] Arrival date & time: 11/13/17  1739     History   Chief Complaint Chief Complaint  Patient presents with  . Hand Pain    HPI Travis Huff is a 57 y.o. male.   HPI  Travis Huff is a 57 y.o. male presenting to UC with c/o worsening Left hand and wrist pain and swelling that started yesterday after trip and fall while taking out his garbage cans. Pt reports falling out outstretched hand. No other injuries. Pain is aching and throbbing, decreased ROM due to the pain. No prior fracture to Left hand or wrist.    Past Medical History:  Diagnosis Date  . Arthritis   . Asthma   . GERD (gastroesophageal reflux disease)    uses tums  . Hypertension   . Viral pericarditis     Patient Active Problem List   Diagnosis Date Noted  . Cramping of hands 10/25/2017  . High frequency hearing loss, left 09/10/2017  . Tremor 09/10/2017  . Vertigo 09/10/2017  . Mixed hyperlipidemia 04/18/2017  . Elevated CK 04/18/2017  . Open dislocation of left second PIP of the foot 11/29/2016  . Scoliosis of thoracolumbar spine 06/23/2016  . Headache 06/20/2016  . Anxiety, generalized 05/11/2016  . Primary osteoarthritis of right hip 05/25/2014  . Benign essential hypertension 03/26/2014  . Spinal stenosis, lumbar region, with neurogenic claudication 12/21/2013  . Primary osteoarthritis of left hip 07/07/2013  . Morbid obesity ([place]) 08/01/2012  . Precordial pain 05/24/2012  . S/P total knee arthroplasty 01/26/2012  . Erectile dysfunction 01/26/2012  . Annual physical exam 01/26/2012    Past Surgical History:  Procedure Laterality Date  . ABDOMINAL EXPOSURE N/A 06/23/2016   Procedure: ABDOMINAL EXPOSURE;  Surgeon: Rosetta Posner, MD;  Location: [facility];  Service: Vascular;  Laterality: N/A;  . ANTERIOR LATERAL LUMBAR FUSION 4 LEVELS Right 06/23/2016   Procedure: Right  Lumbar two-three Lumbar three-four Lumbar four-five Anterolateral lumbar  interbody fusion;  Surgeon: Erline Levine, MD;  Location: [facility];  Service: Neurosurgery;  Laterality: Right;  . ANTERIOR LUMBAR FUSION N/A 06/23/2016   Procedure: Lumbar five -sacral one  Anterior lumbar interbody fusion with Dr. Sherren Mocha Early for approach;  Surgeon: Erline Levine, MD;  Location: [facility];  Service: Neurosurgery;  Laterality: N/A;  . APPLICATION OF INTRAOPERATIVE CT SCAN N/A 06/26/2016   Procedure: APPLICATION OF INTRAOPERATIVE CT SCAN;  Surgeon: Erline Levine, MD;  Location: [facility];  Service: Neurosurgery;  Laterality: N/A;  . CARDIOVASCULAR STRESS TEST     Negative in May 2014  . COLONOSCOPY    . HERNIA REPAIR    . JOINT REPLACEMENT    . KNEE SURGERY    . POSTERIOR LUMBAR FUSION 4 LEVEL N/A 06/26/2016   Procedure: Thoracic ten to Pelvis fixation with AIRO;  Surgeon: Erline Levine, MD;  Location: [facility];  Service: Neurosurgery;  Laterality: N/A;  . TENDON REPAIR     right hand  . TOTAL HIP ARTHROPLASTY Right 05/25/2014   Procedure: RIGHT TOTAL HIP ARTHROPLASTY ANTERIOR APPROACH;  Surgeon: Rod Can, MD;  Location: [facility];  Service: Orthopedics;  Laterality: Right;  . TOTAL KNEE ARTHROPLASTY Bilateral        Home Medications    Prior to Admission medications   Medication Sig Start Date End Date Taking? Authorizing Provider  atenolol (TENORMIN) 25 MG tablet Take 1 tablet (25 mg total) by mouth daily. 10/25/17   Silverio Decamp, MD  BYSTOLIC  10 MG tablet TAKE 1 TABLET (10 MG TOTAL) BY MOUTH DAILY. 09/26/16   Silverio Decamp, MD  calcium carbonate (TUMS - DOSED IN MG ELEMENTAL CALCIUM) 500 MG chewable tablet Chew 3-4 tablets by mouth at bedtime as needed (stomach cramps).    [provider]  celecoxib (CELEBREX) 200 MG capsule TAKE 1 TO 2 CAPSULES BY MOUTH DAILY AS NEEDED FOR PAIN. 09/20/17   Silverio Decamp, MD  cyclobenzaprine (FLEXERIL) 10 MG tablet Take 2 tablets (20 mg total) by mouth at bedtime. 06/15/17   Silverio Decamp, MD  DULoxetine  (CYMBALTA) 60 MG capsule TAKE 1 CAPSULE (60 MG TOTAL) BY MOUTH DAILY. 06/18/17   Silverio Decamp, MD  lisinopril-hydrochlorothiazide (PRINZIDE,ZESTORETIC) 20-25 MG tablet TAKE 1 TABLET BY MOUTH DAILY. 07/30/17   Silverio Decamp, MD  sildenafil (VIAGRA) 100 MG tablet TAKE 1 TABLET BY MOUTH AS NEEDED FOR ERECTILE DYSFUNCTION *MAX DOSE IS 1 TABLET PER DAY* 04/18/17   Silverio Decamp, MD    Family History Family History  Adopted: Yes  Problem Relation Age of Onset  . Healthy Daughter     Social History Social History   Tobacco Use  . Smoking status: Former Smoker    Packs/day: 1.00    Years: 25.00    Pack years: 25.00    Types: Cigarettes    Last attempt to quit: 2000    Years since quitting: 19.8  . Smokeless tobacco: Current User    Types: Snuff  Substance Use Topics  . Alcohol use: Yes    Alcohol/week: 14.0 standard drinks    Types: 14 Cans of beer per week    Comment: 2 beers QD  . Drug use: No     Allergies   Dilaudid [hydromorphone hcl] and Morphine and related   Review of Systems Review of Systems  Musculoskeletal: Positive for arthralgias and joint swelling.  Skin: Negative for color change and wound.  Neurological: Positive for weakness (Left hand due to pain ). Negative for numbness.     Physical Exam Triage Vital Signs ED Triage Vitals  Enc Vitals Group     BP 11/13/17 1758 (!) 168/106     Pulse Rate 11/13/17 1758 82     Resp 11/13/17 1758 18     Temp 11/13/17 1758 97.9 F (36.6 C)     Temp Source 11/13/17 1758 Oral     SpO2 11/13/17 1758 98 %     Weight 11/13/17 1759 297 lb (134.7 kg)     Height 11/13/17 1759 6' (1.829 m)     Head Circumference --      Peak Flow --      Pain Score 11/13/17 1758 8     Pain Loc --      Pain Edu? --      Excl. in [place]? --    No data found.  Updated Vital Signs BP (!) 149/96 (BP Location: Right Arm)   Pulse 82   Temp 97.9 F (36.6 C) (Oral)   Resp 18   Ht 6' (1.829 m)   Wt 297 lb (134.7  kg)   SpO2 98%   BMI 40.28 kg/m   Visual Acuity Right Eye Distance:   Left Eye Distance:   Bilateral Distance:    Right Eye Near:   Left Eye Near:    Bilateral Near:     Physical Exam  Constitutional: He is oriented to person, place, and time. He appears well-developed and well-nourished.  HENT:  Head: Normocephalic  and atraumatic.  Eyes: EOM are normal.  Neck: Normal range of motion.  Cardiovascular: Normal rate.  Pulses:      Radial pulses are 2+ on the left side.  Pulmonary/Chest: Effort normal.  Musculoskeletal: He exhibits edema and tenderness.  Left hand and wrist: mild to moderate edema, worse along ulnar dorsal aspect of wrist with tenderness. Tenderness to proximal thumb. Limited ROM wrist and hand due to pain and joint stiffness.  Neurological: He is alert and oriented to person, place, and time.  Skin: Skin is warm and dry. Capillary refill takes less than 2 seconds.  Left hand and wrist: skin in tact. No ecchymosis or erythema.  Psychiatric: He has a normal mood and affect. His behavior is normal.  Nursing note and vitals reviewed.    UC Treatments / Results  Labs (all labs ordered are listed, but only abnormal results are displayed) Labs Reviewed - No data to display  EKG None  Radiology Dg Wrist Complete Left  Result Date: 11/13/2017 CLINICAL DATA:  Left wrist pain after fall yesterday. EXAM: LEFT WRIST - COMPLETE 3+ VIEW COMPARISON:  None. FINDINGS: Osteoarthritis of the included first CMC, radiocarpal and distal radioulnar joints. No acute fracture joint dislocations. Soft tissue swelling is seen along the radial aspect of the included distal forearm, wrist and hand. Remodeled appearance of the distal radius at the distal radioulnar joint due to ulnar impaction from ulnar minus variance. Slightly remodeled appearance of the base of the lunate and expected location of the ulnar styloid. IMPRESSION: 1. No acute fracture or joint dislocations. Heterotopic  ossifications likely degenerative in etiology are noted at the base of the thumb metacarpal. 2. Soft tissue swelling along the radial aspect of the included distal forearm, wrist and hand. 3. Remodeled appearance of the distal radius at the distal radioulnar joint due to ulnar impaction from ulnar minus variance. Electronically Signed   By: Ashley Royalty M.D.   On: 11/13/2017 18:38   Dg Hand Complete Left  Result Date: 11/13/2017 CLINICAL DATA:  Left hand pain after fall yesterday. Swelling of the hand at the thumb and wrist. EXAM: LEFT HAND - COMPLETE 3+ VIEW COMPARISON:  None. FINDINGS: Mild-to-moderate soft tissue swelling is noted of the included distal forearm, wrist and hand special along the radial aspect. Heterotopic ossifications are noted about the first [facility] joint likely due remote in degenerative in etiology given their well corticated rounded appearance. No acute displaced appearing fracture is identified. There is osteoarthritis of the included first [facility] joint with uniform joint space narrowing of the first through third MCP articulations. Additionally there is ulnar minus variance with stigmata of ulnar impaction and remodeling of the distal radius at the distal radioulnar joint. IMPRESSION: 1. Soft tissue swelling of the included distal forearm, wrist and hand along the radial aspect. No acute osseous abnormality. 2. Osteoarthritis of the first [facility] joint with scattered well corticated ossifications about the first [facility] joint, likely due to degenerative in etiology. 3. Ulnar minus variance with stigmata of ulnar impaction and remodeling of the distal radius at the distal radioulnar joint. Electronically Signed   By: Ashley Royalty M.D.   On: 11/13/2017 18:36    Procedures Procedures (including critical care time)  Medications Ordered in UC Medications - No data to display  Initial Impression / Assessment and Plan / UC Course  I have reviewed the triage vital signs and the nursing  notes.  Pertinent labs & imaging results that were available during my care of the  patient were reviewed by me and considered in my medical decision making (see chart for details).     No acute findings, severe arthritis noted Ace wrap applied for comfort as thumb spica splint too tight/uncomfortable to fit.  Pt has appointment with Dr. Georgina Snell, Sports Medicine, scheduled for 9:10AM tomorrow morning. Encouraged to keep appointment.   Final Clinical Impressions(s) / UC Diagnoses   Final diagnoses:  Injury of left hand, initial encounter  Injury of left wrist, initial encounter  Fall from slip, trip, or stumble, initial encounter  Osteoarthritis of left hand, unspecified osteoarthritis type     Discharge Instructions      You may wear the a hand/wrist splint for comfort.  It is recommended that you keep your appointment with Sports Medicine tomorrow for further evaluation and treatment of your hand and wrist pain.     ED Prescriptions    None     Controlled Substance Prescriptions [place] Controlled Substance Registry consulted? Not Applicable   Tyrell Antonio 11/13/17 0786

## 2017-11-13 NOTE — Telephone Encounter (Signed)
Patient advised.

## 2017-11-14 ENCOUNTER — Ambulatory Visit (INDEPENDENT_AMBULATORY_CARE_PROVIDER_SITE_OTHER): Payer: 59 | Admitting: Family Medicine

## 2017-11-14 ENCOUNTER — Encounter: Payer: Self-pay | Admitting: Family Medicine

## 2017-11-14 VITALS — BP 158/102 | HR 91 | Ht 72.0 in | Wt 299.0 lb

## 2017-11-14 DIAGNOSIS — S60222A Contusion of left hand, initial encounter: Secondary | ICD-10-CM

## 2017-11-14 DIAGNOSIS — Z23 Encounter for immunization: Secondary | ICD-10-CM

## 2017-11-14 NOTE — Patient Instructions (Signed)
Thank you for coming in today.  Recheck in 1 week with Dr T or myself.  If you have a problem let me know  Return sooner if needed.    Contusion A contusion is a deep bruise. Contusions happen when an injury causes bleeding under the skin. Symptoms of bruising include pain, swelling, and discolored skin. The skin may turn blue, purple, or yellow. Follow these instructions at home:  Rest the injured area.  If told, put ice on the injured area. ? Put ice in a plastic bag. ? Place a towel between your skin and the bag. ? Leave the ice on for 20 minutes, 2-3 times per day.  If told, put light pressure (compression) on the injured area using an elastic bandage. Make sure the bandage is not too tight. Remove it and put it back on as told by your doctor.  If possible, raise (elevate) the injured area above the level of your heart while you are sitting or lying down.  Take over-the-counter and prescription medicines only as told by your doctor. Contact a doctor if:  Your symptoms do not get better after several days of treatment.  Your symptoms get worse.  You have trouble moving the injured area. Get help right away if:  You have very bad pain.  You have a loss of feeling (numbness) in a hand or foot.  Your hand or foot turns pale or cold. This information is not intended to replace advice given to you by your health care provider. Make sure you discuss any questions you have with your health care provider. Document Released: 06/07/2007 Document Revised: 05/27/2015 Document Reviewed: 05/06/2014 Elsevier Interactive Patient Education  2018 [employer].

## 2017-11-14 NOTE — Progress Notes (Signed)
Travis Huff is a 57 y.o. male who presents to [facility] today for left hand and wrist pain.  Garrett tripped and fell Monday, October 11 landing on his outstretched left wrist.  He developed pain and swelling and was seen in urgent care yesterday.  X-rays that time did not show fractures of the hand or wrist but did show significant posttraumatic degenerative changes in his first [facility] and diffusely through the wrist.  He has been taking over-the-counter medicines for pain as well as his chronic Dilaudid that he takes for chronic pain medication.  He notes this does not control his pain well.  He notes significant swelling and discomfort.  No radiating pain weakness numbness or tingling or pain distally.    ROS:  As above  Exam:  BP (!) 158/102   Pulse 91   Ht 6' (1.829 m)   Wt 299 lb (135.6 kg)   BMI 40.55 kg/m  General: Well Developed, well nourished, and in no acute distress.  Neuro/Psych: Alert and oriented x3, extra-ocular muscles intact, able to move all 4 extremities, sensation grossly intact. Skin: Warm and dry, no rashes noted.  Respiratory: Not using accessory muscles, speaking in full sentences, trachea midline.  Cardiovascular: Pulses palpable, no extremity edema. Abdomen: Does not appear distended. MSK:  Left hand and wrist significant swelling especially at the thenar area.  No ecchymosis. Tender to palpation diffusely to the wrist but especially at the first [facility]. And motion is decreased due to swelling and pain. Capillary refill is intact distally.  Sensation is intact distally. Pulses are intact in the wrist.  Strength is intact but painful.    Lab and Radiology Results No results found for this or any previous visit (from the past 72 hour(s)). Dg Wrist Complete Left  Result Date: 11/13/2017 CLINICAL DATA:  Left wrist pain after fall yesterday. EXAM: LEFT WRIST - COMPLETE 3+ VIEW COMPARISON:  None. FINDINGS:  Osteoarthritis of the included first [facility], radiocarpal and distal radioulnar joints. No acute fracture joint dislocations. Soft tissue swelling is seen along the radial aspect of the included distal forearm, wrist and hand. Remodeled appearance of the distal radius at the distal radioulnar joint due to ulnar impaction from ulnar minus variance. Slightly remodeled appearance of the base of the lunate and expected location of the ulnar styloid. IMPRESSION: 1. No acute fracture or joint dislocations. Heterotopic ossifications likely degenerative in etiology are noted at the base of the thumb metacarpal. 2. Soft tissue swelling along the radial aspect of the included distal forearm, wrist and hand. 3. Remodeled appearance of the distal radius at the distal radioulnar joint due to ulnar impaction from ulnar minus variance. Electronically Signed   By: Ashley Royalty M.D.   On: 11/13/2017 18:38   Dg Hand Complete Left  Result Date: 11/13/2017 CLINICAL DATA:  Left hand pain after fall yesterday. Swelling of the hand at the thumb and wrist. EXAM: LEFT HAND - COMPLETE 3+ VIEW COMPARISON:  None. FINDINGS: Mild-to-moderate soft tissue swelling is noted of the included distal forearm, wrist and hand special along the radial aspect. Heterotopic ossifications are noted about the first [facility] joint likely due remote in degenerative in etiology given their well corticated rounded appearance. No acute displaced appearing fracture is identified. There is osteoarthritis of the included first [facility] joint with uniform joint space narrowing of the first through third MCP articulations. Additionally there is ulnar minus variance with stigmata of ulnar impaction and remodeling of the  distal radius at the distal radioulnar joint. IMPRESSION: 1. Soft tissue swelling of the included distal forearm, wrist and hand along the radial aspect. No acute osseous abnormality. 2. Osteoarthritis of the first [facility] joint with scattered well corticated  ossifications about the first [facility] joint, likely due to degenerative in etiology. 3. Ulnar minus variance with stigmata of ulnar impaction and remodeling of the distal radius at the distal radioulnar joint. Electronically Signed   By: Ashley Royalty M.D.   On: 11/13/2017 18:36    I personally (independently) visualized and performed the interpretation of the images attached in this note.    Assessment and Plan: 57 y.o. male with left hand and wrist pain and swelling after fall.  Possible radiographic occult fracture or simple significant exacerbation of existing DJD.  Patient was given a thumb spica splint and advised to follow-up in 1 week with myself or his PCP for recheck x-ray and possible injection at that time.  Flu vaccine given today prior to discharge.  Orders Placed This Encounter  Procedures  . Flu Vaccine QUAD 6+ mos PF IM (Fluarix Quad PF)   No orders of the defined types were placed in this encounter.   Historical information moved to improve visibility of documentation.  Past Medical History:  Diagnosis Date  . Arthritis   . Asthma   . GERD (gastroesophageal reflux disease)    uses tums  . Hypertension   . Viral pericarditis    Past Surgical History:  Procedure Laterality Date  . ABDOMINAL EXPOSURE N/A 06/23/2016   Procedure: ABDOMINAL EXPOSURE;  Surgeon: Rosetta Posner, MD;  Location: [place];  Service: Vascular;  Laterality: N/A;  . ANTERIOR LATERAL LUMBAR FUSION 4 LEVELS Right 06/23/2016   Procedure: Right  Lumbar two-three Lumbar three-four Lumbar four-five Anterolateral lumbar interbody fusion;  Surgeon: Erline Levine, MD;  Location: [place];  Service: Neurosurgery;  Laterality: Right;  . ANTERIOR LUMBAR FUSION N/A 06/23/2016   Procedure: Lumbar five -sacral one  Anterior lumbar interbody fusion with Dr. Sherren Mocha Early for approach;  Surgeon: Erline Levine, MD;  Location: [place];  Service: Neurosurgery;  Laterality: N/A;  . APPLICATION OF INTRAOPERATIVE CT SCAN N/A 06/26/2016     Procedure: APPLICATION OF INTRAOPERATIVE CT SCAN;  Surgeon: Erline Levine, MD;  Location: [place];  Service: Neurosurgery;  Laterality: N/A;  . CARDIOVASCULAR STRESS TEST     Negative in May 2014  . COLONOSCOPY    . HERNIA REPAIR    . JOINT REPLACEMENT    . KNEE SURGERY    . POSTERIOR LUMBAR FUSION 4 LEVEL N/A 06/26/2016   Procedure: Thoracic ten to Pelvis fixation with AIRO;  Surgeon: Erline Levine, MD;  Location: [place];  Service: Neurosurgery;  Laterality: N/A;  . TENDON REPAIR     right hand  . TOTAL HIP ARTHROPLASTY Right 05/25/2014   Procedure: RIGHT TOTAL HIP ARTHROPLASTY ANTERIOR APPROACH;  Surgeon: Rod Can, MD;  Location: [place];  Service: Orthopedics;  Laterality: Right;  . TOTAL KNEE ARTHROPLASTY Bilateral    Social History   Tobacco Use  . Smoking status: Former Smoker    Packs/day: 1.00    Years: 25.00    Pack years: 25.00    Types: Cigarettes    Last attempt to quit: 2000    Years since quitting: 19.8  . Smokeless tobacco: Current User    Types: Snuff  Substance Use Topics  . Alcohol use: Yes    Alcohol/week: 14.0 standard drinks    Types: 14 Cans  of beer per week    Comment: 2 beers QD   family history includes Healthy in his daughter. He was adopted.  Medications: Current Outpatient Medications  Medication Sig Dispense Refill  . atenolol (TENORMIN) 25 MG tablet Take 1 tablet (25 mg total) by mouth daily. 30 tablet 3  . BYSTOLIC 10 MG tablet TAKE 1 TABLET (10 MG TOTAL) BY MOUTH DAILY. 90 tablet 3  . calcium carbonate (TUMS - DOSED IN MG ELEMENTAL CALCIUM) 500 MG chewable tablet Chew 3-4 tablets by mouth at bedtime as needed (stomach cramps).    . celecoxib (CELEBREX) 200 MG capsule TAKE 1 TO 2 CAPSULES BY MOUTH DAILY AS NEEDED FOR PAIN. 180 capsule 3  . cyclobenzaprine (FLEXERIL) 10 MG tablet Take 2 tablets (20 mg total) by mouth at bedtime. 180 tablet 3  . DULoxetine (CYMBALTA) 60 MG capsule TAKE 1 CAPSULE (60 MG TOTAL) BY MOUTH DAILY. 30 capsule 3  .  lisinopril-hydrochlorothiazide (PRINZIDE,ZESTORETIC) 20-25 MG tablet TAKE 1 TABLET BY MOUTH DAILY. 90 tablet 3  . sildenafil (VIAGRA) 100 MG tablet TAKE 1 TABLET BY MOUTH AS NEEDED FOR ERECTILE DYSFUNCTION *MAX DOSE IS 1 TABLET PER DAY* 10 tablet 11   No current facility-administered medications for this visit.    Allergies  Allergen Reactions  . Dilaudid [Hydromorphone Hcl] Itching and Other (See Comments)    Couldn't sleep  . Morphine And Related Itching      Discussed warning signs or symptoms. Please see discharge instructions. Patient expresses understanding.

## 2017-11-20 ENCOUNTER — Other Ambulatory Visit: Payer: Self-pay | Admitting: Sports Medicine

## 2017-11-20 DIAGNOSIS — F411 Generalized anxiety disorder: Secondary | ICD-10-CM

## 2017-11-20 MED FILL — DULoxetine HCL 60 MG CPEP: 60 | 30 days supply | Qty: 30 | Fill #0

## 2017-11-21 ENCOUNTER — Ambulatory Visit (INDEPENDENT_AMBULATORY_CARE_PROVIDER_SITE_OTHER): Payer: 59 | Admitting: Sports Medicine

## 2017-11-21 ENCOUNTER — Encounter: Payer: Self-pay | Admitting: Sports Medicine

## 2017-11-21 DIAGNOSIS — M1812 Unilateral primary osteoarthritis of first carpometacarpal joint, left hand: Secondary | ICD-10-CM

## 2017-11-21 DIAGNOSIS — F411 Generalized anxiety disorder: Secondary | ICD-10-CM | POA: Diagnosis not present

## 2017-11-21 MED ORDER — ATENOLOL 50 MG PO TABS: 50.0000 mg | ORAL_TABLET | Freq: Every day | ORAL | 3 refills | Status: DC

## 2017-11-21 MED FILL — LISINOPRIL-HCTZ 20-25 MG TA: 20-25 | 90 days supply | Qty: 90 | Fill #1

## 2017-11-21 MED FILL — ATENOLOL 50 MG TABLET: 50 | 90 days supply | Qty: 90 | Fill #0

## 2017-11-21 MED FILL — HYDROmorphone HCL 2 MG TABS: 2 | 10 days supply | Qty: 60 | Fill #0

## 2017-11-21 NOTE — Assessment & Plan Note (Signed)
Continue atenolol, increasing to 50 mg daily. Discontinue Bystolic. Panic, as well as adrenergic symptoms such as sweating and tachycardia should improve as we up taper the atenolol.

## 2017-11-21 NOTE — Progress Notes (Signed)
Subjective:    CC: Hand pain  HPI: Trigger returns, he is a pleasant 57 year old male, he has left hand pain at the thumb base.  X-rays showed severe osteoarthritis.  Desires interventional treatment today.  Anxiety and swelling: With significant adrenergic symptoms, we added atenolol low-dose to his Bystolic, this really did not help symptoms that much.  I reviewed the past medical history, family history, social history, surgical history, and allergies today and no changes were needed.  Please see the problem list section below in epic for further details.  Past Medical History: Past Medical History:  Diagnosis Date  . Arthritis   . Asthma   . GERD (gastroesophageal reflux disease)    uses tums  . Hypertension   . Viral pericarditis    Past Surgical History: Past Surgical History:  Procedure Laterality Date  . ABDOMINAL EXPOSURE N/A 06/23/2016   Procedure: ABDOMINAL EXPOSURE;  Surgeon: Rosetta Posner, MD;  Location: [place];  Service: Vascular;  Laterality: N/A;  . ANTERIOR LATERAL LUMBAR FUSION 4 LEVELS Right 06/23/2016   Procedure: Right  Lumbar two-three Lumbar three-four Lumbar four-five Anterolateral lumbar interbody fusion;  Surgeon: Erline Levine, MD;  Location: [place];  Service: Neurosurgery;  Laterality: Right;  . ANTERIOR LUMBAR FUSION N/A 06/23/2016   Procedure: Lumbar five -sacral one  Anterior lumbar interbody fusion with Dr. Sherren Mocha Early for approach;  Surgeon: Erline Levine, MD;  Location: [place];  Service: Neurosurgery;  Laterality: N/A;  . APPLICATION OF INTRAOPERATIVE CT SCAN N/A 06/26/2016   Procedure: APPLICATION OF INTRAOPERATIVE CT SCAN;  Surgeon: Erline Levine, MD;  Location: [place];  Service: Neurosurgery;  Laterality: N/A;  . CARDIOVASCULAR STRESS TEST     Negative in May 2014  . COLONOSCOPY    . HERNIA REPAIR    . JOINT REPLACEMENT    . KNEE SURGERY    . POSTERIOR LUMBAR FUSION 4 LEVEL N/A 06/26/2016   Procedure: Thoracic ten to Pelvis fixation with AIRO;   Surgeon: Erline Levine, MD;  Location: [place];  Service: Neurosurgery;  Laterality: N/A;  . TENDON REPAIR     right hand  . TOTAL HIP ARTHROPLASTY Right 05/25/2014   Procedure: RIGHT TOTAL HIP ARTHROPLASTY ANTERIOR APPROACH;  Surgeon: Rod Can, MD;  Location: [place];  Service: Orthopedics;  Laterality: Right;  . TOTAL KNEE ARTHROPLASTY Bilateral    Social History: Social History   Socioeconomic History  . Marital status: Married    Spouse name: Not on file  . Number of children: 2  . Years of education: 37  . Highest education level: Not on file  Occupational History  . Occupation: Public relations account executive  Social Needs  . Financial resource strain: Not on file  . Food insecurity:    Worry: Not on file    Inability: Not on file  . Transportation needs:    Medical: Not on file    Non-medical: Not on file  Tobacco Use  . Smoking status: Former Smoker    Packs/day: 1.00    Years: 25.00    Pack years: 25.00    Types: Cigarettes    Last attempt to quit: 2000    Years since quitting: 19.8  . Smokeless tobacco: Current User    Types: Snuff  Substance and Sexual Activity  . Alcohol use: Yes    Alcohol/week: 14.0 standard drinks    Types: 14 Cans of beer per week    Comment: 2 beers QD  . Drug use: No  . Sexual activity: Not on  file  Lifestyle  . Physical activity:    Days per week: Not on file    Minutes per session: Not on file  . Stress: Not on file  Relationships  . Social connections:    Talks on phone: Not on file    Gets together: Not on file    Attends religious service: Not on file    Active member of club or organization: Not on file    Attends meetings of clubs or organizations: Not on file    Relationship status: Not on file  Other Topics Concern  . Not on file  Social History Narrative   Lives with wife in a one story home.  Has 2 children.  Works as a Public relations account executive.  Education: high school.    Family History: Family History  Adopted: Yes  Problem  Relation Age of Onset  . Healthy Daughter    Allergies: Allergies  Allergen Reactions  . Dilaudid [Hydromorphone Hcl] Itching and Other (See Comments)    Couldn't sleep  . Morphine And Related Itching   Medications: See med rec.  Review of Systems: No fevers, chills, night sweats, weight loss, chest pain, or shortness of breath.   Objective:    General: Well Developed, well nourished, and in no acute distress.  Neuro: Alert and oriented x3, extra-ocular muscles intact, sensation grossly intact.  HEENT: Normocephalic, atraumatic, pupils equal round reactive to light, neck supple, no masses, no lymphadenopathy, thyroid nonpalpable.  Skin: Warm and dry, no rashes. Cardiac: Regular rate and rhythm, no murmurs rubs or gallops, no lower extremity edema.  Respiratory: Clear to auscultation bilaterally. Not using accessory muscles, speaking in full sentences.  Procedure: Real-time Ultrasound Guided Injection of left first [facility] joint Device: GE Logiq E  Verbal informed consent obtained.  Time-out conducted.  Noted no overlying erythema, induration, or other signs of local infection.  Skin prepped in a sterile fashion.  Local anesthesia: Topical Ethyl chloride.  With sterile technique and under real time ultrasound guidance: 1 cc Kenalog 40, 1 cc lidocaine injected easily Completed without difficulty  Pain immediately resolved suggesting accurate placement of the medication.  Advised to call if fevers/chills, erythema, induration, drainage, or persistent bleeding.  Images permanently stored and available for review in the ultrasound unit.  Impression: Technically successful ultrasound guided injection.  Impression and Recommendations:    Primary osteoarthritis of first carpometacarpal joint of left hand Injection as above, return in 1 month.  Anxiety, generalized Continue atenolol, increasing to 50 mg daily. Discontinue Bystolic. Panic, as well as adrenergic symptoms such as  sweating and tachycardia should improve as we up taper the atenolol. ___________________________________________ Gwen Her. Dianah Field, M.D., ABFM., CAQSM. Primary Care and Sports Medicine  [facility]  Adjunct Professor of [employer] [facility] [employer]

## 2017-11-21 NOTE — Assessment & Plan Note (Signed)
Injection as above, return in 1 month.

## 2017-11-26 ENCOUNTER — Ambulatory Visit: Payer: 59 | Admitting: Sports Medicine

## 2017-11-26 ENCOUNTER — Ambulatory Visit (HOSPITAL_BASED_OUTPATIENT_CLINIC_OR_DEPARTMENT_OTHER)
Admission: RE | Admit: 2017-11-26 | Discharge: 2017-11-26 | Disposition: A | Discharge status: Home / Self Care | Payer: 59 | Source: Ambulatory Visit | Attending: Sports Medicine | Admitting: Sports Medicine

## 2017-11-26 ENCOUNTER — Telehealth: Payer: Self-pay | Admitting: Sports Medicine

## 2017-11-26 DIAGNOSIS — I82401 Acute embolism and thrombosis of unspecified deep veins of right lower extremity: Secondary | ICD-10-CM | POA: Diagnosis not present

## 2017-11-26 DIAGNOSIS — M7989 Other specified soft tissue disorders: Secondary | ICD-10-CM | POA: Diagnosis present

## 2017-11-26 DIAGNOSIS — I82811 Embolism and thrombosis of superficial veins of right lower extremities: Secondary | ICD-10-CM | POA: Diagnosis not present

## 2017-11-26 DIAGNOSIS — M7121 Synovial cyst of popliteal space [Baker], right knee: Secondary | ICD-10-CM | POA: Insufficient documentation

## 2017-11-26 DIAGNOSIS — I82461 Acute embolism and thrombosis of right calf muscular vein: Secondary | ICD-10-CM | POA: Insufficient documentation

## 2017-11-26 DIAGNOSIS — I82409 Acute embolism and thrombosis of unspecified deep veins of unspecified lower extremity: Secondary | ICD-10-CM | POA: Insufficient documentation

## 2017-11-26 MED ORDER — APIXABAN 5 MG PO TABS: ORAL_TABLET | ORAL | 6 refills | Status: DC

## 2017-11-26 NOTE — Telephone Encounter (Addendum)
Does have DVT in the right gastrocnemius veins, adding Eliquis, he should start tonight.  Discontinue Celebrex for now.

## 2017-11-26 NOTE — Assessment & Plan Note (Addendum)
Stat DVT ultrasound, I will see him tomorrow for suspicion of lymphangitis.  Starting Eliquis. Treatment will be likely lifelong, due to previous DVT/PE. Week we will discuss this further at the follow-up office visit.

## 2017-11-26 NOTE — Telephone Encounter (Signed)
Ordering stat DVT ultrasound.

## 2017-11-26 NOTE — Telephone Encounter (Signed)
Pt called clinic complaining of right lower extremity (leg/ankle/foot). Does note some "streaking" going up the leg. Denies any fever, but states he has had some chills just hasn't checked his temp. No thermometer available. Pt has hx of DVT. Spoke with PCP, wants Pt to get DVT US today then schedule appt for tomorrow.  appointment made for 11/27/17 at 1530.

## 2017-11-27 ENCOUNTER — Encounter: Payer: Self-pay | Admitting: Sports Medicine

## 2017-11-27 ENCOUNTER — Ambulatory Visit: Payer: 59 | Admitting: Sports Medicine

## 2017-11-27 NOTE — Telephone Encounter (Signed)
Results were reviewed with Pt by PCP. Pt also has appt today.

## 2017-11-27 NOTE — Telephone Encounter (Signed)
Pt advised.

## 2017-11-27 NOTE — Telephone Encounter (Signed)
Pt left msg for triage stating he has started Eliquis but is concerned about blood clots. Wanting to know if Dr T thinks he needs any kind of imaging done on his chest to check for clots? Please advise

## 2017-11-27 NOTE — Telephone Encounter (Signed)
He does have a blood clot, it is in his leg.  Chest imaging is not needed, it would not change the plan.  If there is a pulmonary embolus, symptomatic or asymptomatic the treatment is Eliquis at the same dose.

## 2017-11-27 NOTE — Telephone Encounter (Signed)
This encounter was created in error - please disregard.

## 2017-12-04 ENCOUNTER — Ambulatory Visit (INDEPENDENT_AMBULATORY_CARE_PROVIDER_SITE_OTHER): Payer: 59 | Admitting: Sports Medicine

## 2017-12-04 ENCOUNTER — Encounter: Payer: Self-pay | Admitting: Sports Medicine

## 2017-12-04 DIAGNOSIS — I82461 Acute embolism and thrombosis of right calf muscular vein: Secondary | ICD-10-CM

## 2017-12-04 DIAGNOSIS — F411 Generalized anxiety disorder: Secondary | ICD-10-CM | POA: Diagnosis not present

## 2017-12-04 DIAGNOSIS — I739 Peripheral vascular disease, unspecified: Secondary | ICD-10-CM

## 2017-12-04 DIAGNOSIS — I10 Essential (primary) hypertension: Secondary | ICD-10-CM

## 2017-12-04 DIAGNOSIS — L989 Disorder of the skin and subcutaneous tissue, unspecified: Secondary | ICD-10-CM

## 2017-12-04 DIAGNOSIS — R234 Changes in skin texture: Secondary | ICD-10-CM | POA: Insufficient documentation

## 2017-12-04 MED ORDER — CLOBETASOL PROPIONATE 0.05 % EX OINT: 1.0000 "application " | TOPICAL_OINTMENT | Freq: Two times a day (BID) | CUTANEOUS | 11 refills | Status: DC

## 2017-12-04 MED ORDER — AMBULATORY NON FORMULARY MEDICATION: 0 refills | Status: DC

## 2017-12-04 MED ORDER — ATENOLOL 100 MG PO TABS: 100.0000 mg | ORAL_TABLET | Freq: Every day | ORAL | 3 refills | Status: DC

## 2017-12-04 MED FILL — CLOBETASOL PROP 0.05% OINT: 0.05 | 30 days supply | Qty: 60 | Fill #0

## 2017-12-04 MED FILL — ATENOLOL 100 MG TAB: 100 | 90 days supply | Qty: 90 | Fill #0

## 2017-12-04 NOTE — Assessment & Plan Note (Signed)
On both heels, adding topical clobetasol.

## 2017-12-04 NOTE — Assessment & Plan Note (Signed)
Neurogenic versus vascular, adding ABIs and if positive we will do an MRI angiography from the aorta down to the dorsalis pedis and posterior tibial arteries

## 2017-12-04 NOTE — Assessment & Plan Note (Signed)
With sweating and excessive adrenergic symptoms, we can block this as well as his hypertension with atenolol, increasing dose to 100 mg daily. Return to see me next week.

## 2017-12-04 NOTE — Assessment & Plan Note (Signed)
Continue Eliquis, adding lower extremity compression hose. He does have a history of DVT/PE so anticoagulation will be lifelong. He also has a saphenous SVT which they will treat with massage and heat.

## 2017-12-04 NOTE — Patient Instructions (Signed)
Phlebitis Phlebitis is soreness and swelling (inflammation) of a vein. This can occur in your arms, legs, or torso (trunk), as well as deeper inside your body. Phlebitis is usually not serious when it occurs close to the surface of the body. However, it can cause serious problems when it occurs in a vein deeper inside the body. What are the causes? Phlebitis can be triggered by various things, including:  Reduced blood flow through your veins. This can happen with: ? Bed rest over a long period. ? Long-distance travel. ? Injury. ? Surgery. ? Being overweight (obese) or pregnant.  Having an IV tube put in the vein and getting certain medicines through the vein.  Cancer and cancer treatment.  Use of illegal drugs taken through the vein.  Inflammatory diseases.  Inherited (genetic) diseases that increase the risk of blood clots.  Hormone therapy, such as birth control pills.  What are the signs or symptoms?  Red, tender, swollen, and painful area on your skin. Usually, the area will be long and narrow.  Firmness along the center of the affected area. This can indicate that a blood clot has formed.  Low-grade fever. How is this diagnosed? A health care provider can usually diagnose phlebitis by examining the affected area and asking about your symptoms. To check for infection or blood clots, your health care provider may order blood tests or an ultrasound exam of the area. Blood tests and your family history may also indicate if you have an underlying genetic disease that causes blood clots. Occasionally, a piece of tissue is taken from the body (biopsy sample) if an unusual cause of phlebitis is suspected. How is this treated? Treatment will vary depending on the severity of the condition and the area of the body affected. Treatment may include:  Use of a warm compress or heating pad.  Use of compression stockings or bandages.  Anti-inflammatory medicines.  Removal of any IV  tube that may be causing the problem.  Medicines that kill germs (antibiotics) if an infection is present.  Blood-thinning medicines if a blood clot is suspected or present.  In rare cases, surgery may be needed to remove damaged sections of vein.  Follow these instructions at home:  Only take over-the-counter or prescription medicines as directed by your health care provider. Take all medicines exactly as prescribed.  Raise (elevate) the affected area above the level of your heart as directed by your health care provider.  Apply a warm compress or heating pad to the affected area as directed by your health care provider. Do not sleep with the heating pad.  Use compression stockings or bandages as directed. These will speed healing and prevent the condition from coming back.  If you are on blood thinners: ? Get follow-up blood tests as directed by your health care provider. ? Check with your health care provider before using any new medicines. ? Carry a medical alert card or wear your medical alert jewelry to show that you are on blood thinners.  For phlebitis in the legs: ? Avoid prolonged standing or bed rest. ? Keep your legs moving. Raise your legs when sitting or lying.  Do not smoke.  Women, particularly those over the age of 35, should consider the risks and benefits of taking the contraceptive pill. This kind of hormone treatment can increase your risk for blood clots.  Follow up with your health care provider as directed. Contact a health care provider if:  You have unusual bruising or any   bleeding problems.  Your swelling or pain in the affected area is not improving.  You are on anti-inflammatory medicine, and you develop belly (abdominal) pain. Get help right away if:  You have a sudden onset of chest pain or difficulty breathing.  You have a fever or persistent symptoms for more than 2-3 days.  You have a fever and your symptoms suddenly get worse. This  information is not intended to replace advice given to you by your health care provider. Make sure you discuss any questions you have with your health care provider. Document Released: 12/13/2000 Document Revised: 05/27/2015 Document Reviewed: 08/26/2012 Elsevier Interactive Patient Education  2017 [employer]  

## 2017-12-04 NOTE — Progress Notes (Signed)
Subjective:    CC: Follow-up  HPI: Right leg swelling: Ultrasound showed gastrocnemius DVT as well as saphenous superficial venous thrombosis, we started Eliquis immediately, he does still have some swelling and pain likely in association with the superficial venous thrombosis, he and his wife had some questions.  No chest pain, no shortness of breath, he does have a history of DVT PE and understands that anticoagulation will be lifelong.  Anxiety: Slight improvement with atenolol, he does have significant sweatiness, shaking.  Hypertension: No headaches, visual changes, chest pain, persistently elevated blood pressure today.  Leg cramping: Initially suspected to be due to neurogenic claudication but we have not evaluated him for vascular claudication.  I reviewed the past medical history, family history, social history, surgical history, and allergies today and no changes were needed.  Please see the problem list section below in epic for further details.  Past Medical History: Past Medical History:  Diagnosis Date  . Arthritis   . Asthma   . GERD (gastroesophageal reflux disease)    uses tums  . Hypertension   . Viral pericarditis    Past Surgical History: Past Surgical History:  Procedure Laterality Date  . ABDOMINAL EXPOSURE N/A 06/23/2016   Procedure: ABDOMINAL EXPOSURE;  Surgeon: Rosetta Posner, MD;  Location: [place];  Service: Vascular;  Laterality: N/A;  . ANTERIOR LATERAL LUMBAR FUSION 4 LEVELS Right 06/23/2016   Procedure: Right  Lumbar two-three Lumbar three-four Lumbar four-five Anterolateral lumbar interbody fusion;  Surgeon: Erline Levine, MD;  Location: [place];  Service: Neurosurgery;  Laterality: Right;  . ANTERIOR LUMBAR FUSION N/A 06/23/2016   Procedure: Lumbar five -sacral one  Anterior lumbar interbody fusion with Dr. Sherren Mocha Early for approach;  Surgeon: Erline Levine, MD;  Location: [place];  Service: Neurosurgery;  Laterality: N/A;  . APPLICATION OF INTRAOPERATIVE CT  SCAN N/A 06/26/2016   Procedure: APPLICATION OF INTRAOPERATIVE CT SCAN;  Surgeon: Erline Levine, MD;  Location: [place];  Service: Neurosurgery;  Laterality: N/A;  . CARDIOVASCULAR STRESS TEST     Negative in May 2014  . COLONOSCOPY    . HERNIA REPAIR    . JOINT REPLACEMENT    . KNEE SURGERY    . POSTERIOR LUMBAR FUSION 4 LEVEL N/A 06/26/2016   Procedure: Thoracic ten to Pelvis fixation with AIRO;  Surgeon: Erline Levine, MD;  Location: [place];  Service: Neurosurgery;  Laterality: N/A;  . TENDON REPAIR     right hand  . TOTAL HIP ARTHROPLASTY Right 05/25/2014   Procedure: RIGHT TOTAL HIP ARTHROPLASTY ANTERIOR APPROACH;  Surgeon: Rod Can, MD;  Location: [place];  Service: Orthopedics;  Laterality: Right;  . TOTAL KNEE ARTHROPLASTY Bilateral    Social History: Social History   Socioeconomic History  . Marital status: Married    Spouse name: Not on file  . Number of children: 2  . Years of education: 62  . Highest education level: Not on file  Occupational History  . Occupation: Public relations account executive  Social Needs  . Financial resource strain: Not on file  . Food insecurity:    Worry: Not on file    Inability: Not on file  . Transportation needs:    Medical: Not on file    Non-medical: Not on file  Tobacco Use  . Smoking status: Former Smoker    Packs/day: 1.00    Years: 25.00    Pack years: 25.00    Types: Cigarettes    Last attempt to quit: 2000    Years since  quitting: 19.9  . Smokeless tobacco: Current User    Types: Snuff  Substance and Sexual Activity  . Alcohol use: Yes    Alcohol/week: 14.0 standard drinks    Types: 14 Cans of beer per week    Comment: 2 beers QD  . Drug use: No  . Sexual activity: Not on file  Lifestyle  . Physical activity:    Days per week: Not on file    Minutes per session: Not on file  . Stress: Not on file  Relationships  . Social connections:    Talks on phone: Not on file    Gets together: Not on file    Attends religious service:  Not on file    Active member of club or organization: Not on file    Attends meetings of clubs or organizations: Not on file    Relationship status: Not on file  Other Topics Concern  . Not on file  Social History Narrative   Lives with wife in a one story home.  Has 2 children.  Works as a Public relations account executive.  Education: high school.    Family History: Family History  Adopted: Yes  Problem Relation Age of Onset  . Healthy Daughter    Allergies: Allergies  Allergen Reactions  . Dilaudid [Hydromorphone Hcl] Itching and Other (See Comments)    Couldn't sleep  . Morphine And Related Itching   Medications: See med rec.  Review of Systems: No fevers, chills, night sweats, weight loss, chest pain, or shortness of breath.   Objective:    General: Well Developed, well nourished, and in no acute distress.  Neuro: Alert and oriented x3, extra-ocular muscles intact, sensation grossly intact.  HEENT: Normocephalic, atraumatic, pupils equal round reactive to light, neck supple, no masses, no lymphadenopathy, thyroid nonpalpable.  Skin: Warm and dry, no rashes. Cardiac: Regular rate and rhythm, no murmurs rubs or gallops, no lower extremity edema.  Respiratory: Clear to auscultation bilaterally. Not using accessory muscles, speaking in full sentences. Right foot: Palpable thrombosed superficial vein, likely great saphenous, minimal surrounding erythema, no calf tenderness.  Palpable dorsalis pedis and posterior tibial pulses bilaterally.  Significant cracking of skin on the heels.  Impression and Recommendations:    Acute deep vein thrombosis (DVT) of calf muscle vein of right lower extremity Continue Eliquis, adding lower extremity compression hose. He does have a history of DVT/PE so anticoagulation will be lifelong. He also has a saphenous SVT which they will treat with massage and heat.  Intermittent claudication (HCC) Neurogenic versus vascular, adding ABIs and if positive we will do  an MRI angiography from the aorta down to the dorsalis pedis and posterior tibial arteries  Benign essential hypertension With sweating and excessive adrenergic symptoms, we can block this as well as his hypertension with atenolol, increasing dose to 100 mg daily. Return to see me next week.  Cracking skin On both heels, adding topical clobetasol. ___________________________________________ Gwen Her. Dianah Field, M.D., ABFM., CAQSM. Primary Care and Sports Medicine [facility]  Adjunct Professor of [place] [employer] [facility] [employer]

## 2017-12-06 ENCOUNTER — Ambulatory Visit: Payer: 59 | Admitting: Sports Medicine

## 2017-12-10 ENCOUNTER — Other Ambulatory Visit: Payer: Self-pay

## 2017-12-10 DIAGNOSIS — I82442 Acute embolism and thrombosis of left tibial vein: Secondary | ICD-10-CM

## 2017-12-11 ENCOUNTER — Ambulatory Visit (INDEPENDENT_AMBULATORY_CARE_PROVIDER_SITE_OTHER): Payer: 59 | Admitting: Sports Medicine

## 2017-12-11 ENCOUNTER — Encounter: Payer: Self-pay | Admitting: Sports Medicine

## 2017-12-11 DIAGNOSIS — I82461 Acute embolism and thrombosis of right calf muscular vein: Secondary | ICD-10-CM

## 2017-12-11 DIAGNOSIS — R251 Tremor, unspecified: Secondary | ICD-10-CM | POA: Diagnosis not present

## 2017-12-11 DIAGNOSIS — I10 Essential (primary) hypertension: Secondary | ICD-10-CM | POA: Diagnosis not present

## 2017-12-11 DIAGNOSIS — F411 Generalized anxiety disorder: Secondary | ICD-10-CM

## 2017-12-11 DIAGNOSIS — M7989 Other specified soft tissue disorders: Secondary | ICD-10-CM | POA: Diagnosis not present

## 2017-12-11 MED ORDER — PRIMIDONE 50 MG PO TABS: ORAL_TABLET | ORAL | 3 refills | Status: DC

## 2017-12-11 MED ORDER — ATENOLOL 100 MG PO TABS: 100.0000 mg | ORAL_TABLET | Freq: Every day | ORAL | 3 refills | Status: DC

## 2017-12-11 MED ORDER — APIXABAN 5 MG PO TABS: 5.0000 mg | ORAL_TABLET | Freq: Two times a day (BID) | ORAL | 3 refills | Status: DC

## 2017-12-11 MED FILL — ELIQUIS 5 MG TABLET: 5 | 30 days supply | Qty: 60 | Fill #0

## 2017-12-11 MED FILL — PRIMIDONE 50 MG TABLET: 50 | 30 days supply | Qty: 90 | Fill #0

## 2017-12-11 NOTE — Patient Instructions (Signed)
Essential Tremor A tremor is trembling or shaking that you cannot control. Most tremors affect the hands or arms. Tremors can also affect the head, vocal cords, face, and other parts of the body. Essential tremor is a tremor without a known cause. What are the causes? Essential tremor has no known cause. What increases the risk? You may be at greater risk of essential tremor if:  You have a family member with essential tremor.  You are age 56 or older.  You take certain medicines.  What are the signs or symptoms? The main sign of a tremor is uncontrolled and unintentional rhythmic shaking of a body part.  You may have difficulty eating with a spoon or fork.  You may have difficulty writing.  You may nod your head up and down or side to side.  You may have a quivering voice.  Your tremors:  May get worse over time.  May come and go.  May be more noticeable on one side of your body.  May get worse due to stress, fatigue, caffeine, and extreme heat or cold.  How is this diagnosed? Your health care provider can diagnose essential tremor based on your symptoms, medical history, and a physical examination. There is no single test to diagnose an essential tremor. However, your health care provider may perform a variety of tests to rule out other conditions. Tests may include:  Blood and urine tests.  Imaging studies of your brain, such as: ? CT scan. ? MRI.  A test that measures involuntary muscle movement (electromyogram).  How is this treated? Your tremors may go away without treatment. Mild tremors may not need treatment if they do not affect your day-to-day life. Severe tremors may need to be treated using one or a combination of the following options:  Medicines. This may include medicine that is injected.  Lifestyle changes.  Physical therapy.  Follow these instructions at home:  Take medicines only as directed by your health care provider.  Limit alcohol  intake to no more than 1 drink per day for nonpregnant women and 2 drinks per day for men. One drink equals 12 oz of beer, 5 oz of wine, or 1 oz of hard liquor.  Do not use any tobacco products, including cigarettes, chewing tobacco, or electronic cigarettes. If you need help quitting, ask your health care provider.  Take medicines only as directed by your health care provider.  Avoid extreme heat or cold.  Limit the amount of caffeine you consumeas directed by your health care provider.  Try to get eight hours of sleep each night.  Find ways to manage your stress, such as meditation or yoga.  Keep all follow-up visits as directed by your health care provider. This is important. This includes any physical therapy visits. Contact a health care provider if:  You experience any changes in the location or intensity of your tremors.  You start having a tremor after starting a new medicine.  You have tremor with other symptoms such as: ? Numbness. ? Tingling. ? Pain. ? Weakness.  Your tremor gets worse.  Your tremor interferes with your daily life. This information is not intended to replace advice given to you by your health care provider. Make sure you discuss any questions you have with your health care provider. Document Released: 01/09/2014 Document Revised: 05/27/2015 Document Reviewed: 06/16/2013 Elsevier Interactive Patient Education  2018 Elsevier Inc.  

## 2017-12-11 NOTE — Assessment & Plan Note (Signed)
Not any better with high-dose atenolol. He does get diffuse hyperhidrosis, tremor in his hands and feet, very unsteady gait. I am going to add primidone. I would also like him to get an opinion from 1 of our neurologists regarding his gait and his tremor. I do think most of his gait is related to his neuropathy from his multiple thoracolumbar compressive pathologies.

## 2017-12-11 NOTE — Progress Notes (Signed)
Subjective:    CC: Follow-up  HPI: Hypertension: For improved with 100 of atenolol daily.  Claudication: Neurogenic versus vascular, suspect neurogenic, ABI coming up to rule out vascular.  Tremor: Did not improve much with the increase to 100 mg of atenolol.  I reviewed the past medical history, family history, social history, surgical history, and allergies today and no changes were needed.  Please see the problem list section below in epic for further details.  Past Medical History: Past Medical History:  Diagnosis Date  . Arthritis   . Asthma   . GERD (gastroesophageal reflux disease)    uses tums  . Hypertension   . Viral pericarditis    Past Surgical History: Past Surgical History:  Procedure Laterality Date  . ABDOMINAL EXPOSURE N/A 06/23/2016   Procedure: ABDOMINAL EXPOSURE;  Surgeon: Rosetta Posner, MD;  Location: [place];  Service: Vascular;  Laterality: N/A;  . ANTERIOR LATERAL LUMBAR FUSION 4 LEVELS Right 06/23/2016   Procedure: Right  Lumbar two-three Lumbar three-four Lumbar four-five Anterolateral lumbar interbody fusion;  Surgeon: Erline Levine, MD;  Location: [place];  Service: Neurosurgery;  Laterality: Right;  . ANTERIOR LUMBAR FUSION N/A 06/23/2016   Procedure: Lumbar five -sacral one  Anterior lumbar interbody fusion with Dr. Sherren Mocha Early for approach;  Surgeon: Erline Levine, MD;  Location: [place];  Service: Neurosurgery;  Laterality: N/A;  . APPLICATION OF INTRAOPERATIVE CT SCAN N/A 06/26/2016   Procedure: APPLICATION OF INTRAOPERATIVE CT SCAN;  Surgeon: Erline Levine, MD;  Location: [place];  Service: Neurosurgery;  Laterality: N/A;  . CARDIOVASCULAR STRESS TEST     Negative in May 2014  . COLONOSCOPY    . HERNIA REPAIR    . JOINT REPLACEMENT    . KNEE SURGERY    . POSTERIOR LUMBAR FUSION 4 LEVEL N/A 06/26/2016   Procedure: Thoracic ten to Pelvis fixation with AIRO;  Surgeon: Erline Levine, MD;  Location: [place];  Service: Neurosurgery;  Laterality: N/A;  . TENDON  REPAIR     right hand  . TOTAL HIP ARTHROPLASTY Right 05/25/2014   Procedure: RIGHT TOTAL HIP ARTHROPLASTY ANTERIOR APPROACH;  Surgeon: Rod Can, MD;  Location: [place];  Service: Orthopedics;  Laterality: Right;  . TOTAL KNEE ARTHROPLASTY Bilateral    Social History: Social History   Socioeconomic History  . Marital status: Married    Spouse name: Not on file  . Number of children: 2  . Years of education: 15  . Highest education level: Not on file  Occupational History  . Occupation: Public relations account executive  Social Needs  . Financial resource strain: Not on file  . Food insecurity:    Worry: Not on file    Inability: Not on file  . Transportation needs:    Medical: Not on file    Non-medical: Not on file  Tobacco Use  . Smoking status: Former Smoker    Packs/day: 1.00    Years: 25.00    Pack years: 25.00    Types: Cigarettes    Last attempt to quit: 2000    Years since quitting: 19.9  . Smokeless tobacco: Current User    Types: Snuff  Substance and Sexual Activity  . Alcohol use: Yes    Alcohol/week: 14.0 standard drinks    Types: 14 Cans of beer per week    Comment: 2 beers QD  . Drug use: No  . Sexual activity: Not on file  Lifestyle  . Physical activity:    Days per week: Not on  file    Minutes per session: Not on file  . Stress: Not on file  Relationships  . Social connections:    Talks on phone: Not on file    Gets together: Not on file    Attends religious service: Not on file    Active member of club or organization: Not on file    Attends meetings of clubs or organizations: Not on file    Relationship status: Not on file  Other Topics Concern  . Not on file  Social History Narrative   Lives with wife in a one story home.  Has 2 children.  Works as a Public relations account executive.  Education: high school.    Family History: Family History  Adopted: Yes  Problem Relation Age of Onset  . Healthy Daughter    Allergies: Allergies  Allergen Reactions  . Dilaudid  [Hydromorphone Hcl] Itching and Other (See Comments)    Couldn't sleep  . Morphine And Related Itching   Medications: See med rec.  Review of Systems: No fevers, chills, night sweats, weight loss, chest pain, or shortness of breath.   Objective:    General: Well Developed, well nourished, and in no acute distress.  Neuro: Alert and oriented x3, extra-ocular muscles intact, sensation grossly intact.  HEENT: Normocephalic, atraumatic, pupils equal round reactive to light, neck supple, no masses, no lymphadenopathy, thyroid nonpalpable.  Skin: Warm and dry, no rashes. Cardiac: Regular rate and rhythm, no murmurs rubs or gallops, no lower extremity edema.  Respiratory: Clear to auscultation bilaterally. Not using accessory muscles, speaking in full sentences.  Impression and Recommendations:    Tremor Not any better with high-dose atenolol. He does get diffuse hyperhidrosis, tremor in his hands and feet, very unsteady gait. I am going to add primidone. I would also like him to get an opinion from 1 of our neurologists regarding his gait and his tremor. I do think most of his gait is related to his neuropathy from his multiple thoracolumbar compressive pathologies. ___________________________________________ Gwen Her. Dianah Field, M.D., ABFM., CAQSM. Primary Care and Sports Medicine [place] [facility]  Adjunct Professor of [place] of [facility] of Medicine

## 2017-12-13 ENCOUNTER — Telehealth: Payer: Self-pay | Admitting: Sports Medicine

## 2017-12-13 MED ORDER — PREDNISONE 50 MG PO TABS: ORAL_TABLET | ORAL | 0 refills | Status: DC

## 2017-12-13 MED FILL — predniSONE 50 MG TABS: 50 | 5 days supply | Qty: 5 | Fill #0

## 2017-12-13 NOTE — Telephone Encounter (Signed)
He can come off of Eliquis for 2 days before the injection.  Restart immediately after the shot.

## 2017-12-13 NOTE — Telephone Encounter (Signed)
Patient notified med sent

## 2017-12-13 NOTE — Telephone Encounter (Signed)
Sure, 50 mg was called in.

## 2017-12-13 NOTE — Telephone Encounter (Signed)
He hasn't even gotten a call from that office yet to get it scheduled. He is asking if you will give a burst of steroid to help with the pain in the back.

## 2017-12-13 NOTE — Telephone Encounter (Signed)
Patient calls and states his back pain is killing him and can't come off Eliquis before able to have injections for the referral you place. Wanted to know if you would call in a steroid to help reduce inflammation until he gets a call when his appointment will be. Please advise. Must print E-scribe is down and not working so will need to print and fax. [facility]

## 2017-12-14 ENCOUNTER — Ambulatory Visit (HOSPITAL_BASED_OUTPATIENT_CLINIC_OR_DEPARTMENT_OTHER)
Admission: RE | Admit: 2017-12-14 | Discharge: 2017-12-14 | Disposition: A | Discharge status: Home / Self Care | Payer: 59 | Source: Ambulatory Visit | Attending: Sports Medicine | Admitting: Sports Medicine

## 2017-12-14 DIAGNOSIS — I739 Peripheral vascular disease, unspecified: Secondary | ICD-10-CM | POA: Insufficient documentation

## 2017-12-14 NOTE — Progress Notes (Signed)
ABI  Lower Arterial Doppler Evaluation   Right: Resting right ankle-brachial index is within normal range. No evidence of significant right lower extremity arterial disease. The right toe-brachial index is normal.   Left: Resting left ankle-brachial index is within normal range. No evidence of significant left lower extremity arterial disease. The left toe-brachial index is normal.     12/14/17 Cardell Peach RDCS, RVT

## 2017-12-17 ENCOUNTER — Telehealth: Payer: Self-pay

## 2017-12-17 MED ORDER — PREDNISONE 10 MG (48) PO TBPK: ORAL_TABLET | Freq: Every day | ORAL | 0 refills | Status: DC

## 2017-12-17 MED FILL — predniSONE 10 MG TABS: 10 | 12 days supply | Qty: 48 | Fill #0

## 2017-12-17 NOTE — Telephone Encounter (Signed)
Patient advised.

## 2017-12-17 NOTE — Telephone Encounter (Signed)
Calling in a prednisone taper

## 2017-12-17 NOTE — Telephone Encounter (Signed)
Travis Huff called and states he is still having back pain and wants more prednisone. Please advise.

## 2017-12-18 ENCOUNTER — Telehealth: Payer: Self-pay | Admitting: Sports Medicine

## 2017-12-18 DIAGNOSIS — Z981 Arthrodesis status: Secondary | ICD-10-CM

## 2017-12-18 DIAGNOSIS — M4325 Fusion of spine, thoracolumbar region: Secondary | ICD-10-CM

## 2017-12-18 NOTE — Telephone Encounter (Signed)
History of multilevel thoracolumbar fusion, increasing back pain now with urinary incontinence, adding a stat thoracolumbar myelogram.  Travis Huff really needs to discuss this further with his neurosurgeon.

## 2017-12-18 NOTE — Telephone Encounter (Signed)
Imaging advised.

## 2017-12-19 NOTE — Telephone Encounter (Signed)
Patient called and left a message stating the neurosurgeon needs him to stop the Eluquis 3 days prior to surgery. He states Dr Dianah Field needs to call the neurosurgeon to give the ok to stop.

## 2017-12-19 NOTE — Telephone Encounter (Signed)
Patient called back and state there is no way he can do the myelogram. He states he is not able to lay still for 24 hours after the procedure due to pain.

## 2017-12-19 NOTE — Progress Notes (Signed)
Spoke with patient's wife to screen him for the lumbar myelogram ordered by Dr. Dianah Field.  After I told her that her husband would need to be on bedrest for 24 hours, she emphatically said he could not/would not be able to do that due to his pain.  She states he is up and down from his recliner "all the time," and would not be able to tolerate being flat and being still for 24 hours.  I told her that was fine; I was not about to force him to go through this.  I did ask that they contact Dr. Darene Lamer to let him know about all of this, so that he could figure out what to do from here.  Brita Romp, RN

## 2017-12-19 NOTE — Telephone Encounter (Signed)
He needs to stop the Eliquis 2 days prior to his CT myelogram injection.  This is NOT an epidural type injection.  I am not aware of any other surgery he needs to have.  The patient just needs to know to stop his medication.  Has he been scheduled for his CT myelogram?

## 2017-12-21 ENCOUNTER — Telehealth: Payer: Self-pay

## 2017-12-21 DIAGNOSIS — I82461 Acute embolism and thrombosis of right calf muscular vein: Secondary | ICD-10-CM

## 2017-12-21 NOTE — Telephone Encounter (Signed)
Travis Huff called and states he would like Dr Dianah Field to call Dr Early to have him do a follow up ultra sound. The Neurologist suggested it to be done again.

## 2017-12-21 NOTE — Telephone Encounter (Signed)
A followup DVT ultrasound?  We can do that ourselves downstairs.  I'm happy to order it.  The results won't mean anything though but if that's what they want then I'm happy to order it.

## 2017-12-21 NOTE — Assessment & Plan Note (Signed)
Patient requesting 2 week followup ultrasound, I advised it likely wouldn't change what we do, he is on lifelong anticoagulation.  Ordering RLE [place] per patient request.

## 2017-12-21 NOTE — Telephone Encounter (Signed)
Yes, he would like to go ahead and have it done.

## 2017-12-21 NOTE — Telephone Encounter (Signed)
Orders placed.

## 2017-12-21 NOTE — Telephone Encounter (Signed)
Patient advised.

## 2017-12-24 MED FILL — DULoxetine HCL 60 MG CPEP: 60 | 30 days supply | Qty: 30 | Fill #1

## 2017-12-27 ENCOUNTER — Emergency Department (HOSPITAL_COMMUNITY)
Admission: EM | Admit: 2017-12-27 | Discharge: 2017-12-27 | Disposition: A | Discharge status: Home / Self Care | Payer: 59 | Attending: Emergency Medicine | Admitting: Emergency Medicine

## 2017-12-27 ENCOUNTER — Other Ambulatory Visit: Payer: Self-pay | Admitting: Vascular Surgery

## 2017-12-27 ENCOUNTER — Encounter (HOSPITAL_COMMUNITY): Payer: Self-pay

## 2017-12-27 ENCOUNTER — Other Ambulatory Visit: Payer: Self-pay

## 2017-12-27 DIAGNOSIS — M5432 Sciatica, left side: Secondary | ICD-10-CM | POA: Insufficient documentation

## 2017-12-27 DIAGNOSIS — R52 Pain, unspecified: Secondary | ICD-10-CM | POA: Diagnosis not present

## 2017-12-27 DIAGNOSIS — M5489 Other dorsalgia: Secondary | ICD-10-CM | POA: Diagnosis not present

## 2017-12-27 DIAGNOSIS — Z96653 Presence of artificial knee joint, bilateral: Secondary | ICD-10-CM | POA: Diagnosis not present

## 2017-12-27 DIAGNOSIS — M5442 Lumbago with sciatica, left side: Secondary | ICD-10-CM | POA: Diagnosis not present

## 2017-12-27 DIAGNOSIS — I959 Hypotension, unspecified: Secondary | ICD-10-CM | POA: Diagnosis not present

## 2017-12-27 DIAGNOSIS — M545 Low back pain: Secondary | ICD-10-CM | POA: Diagnosis not present

## 2017-12-27 DIAGNOSIS — Z7901 Long term (current) use of anticoagulants: Secondary | ICD-10-CM | POA: Diagnosis not present

## 2017-12-27 DIAGNOSIS — Z79899 Other long term (current) drug therapy: Secondary | ICD-10-CM | POA: Diagnosis not present

## 2017-12-27 DIAGNOSIS — I10 Essential (primary) hypertension: Secondary | ICD-10-CM | POA: Diagnosis not present

## 2017-12-27 DIAGNOSIS — R269 Unspecified abnormalities of gait and mobility: Secondary | ICD-10-CM | POA: Diagnosis not present

## 2017-12-27 DIAGNOSIS — Z87891 Personal history of nicotine dependence: Secondary | ICD-10-CM | POA: Diagnosis not present

## 2017-12-27 DIAGNOSIS — M79605 Pain in left leg: Secondary | ICD-10-CM | POA: Diagnosis not present

## 2017-12-27 DIAGNOSIS — I824Z1 Acute embolism and thrombosis of unspecified deep veins of right distal lower extremity: Secondary | ICD-10-CM

## 2017-12-27 MED ORDER — DIAZEPAM 5 MG PO TABS
5.0000 mg | ORAL_TABLET | Freq: Once | ORAL | Status: AC
  Administered 2017-12-27: 5 mg via ORAL
  Filled 2017-12-27: qty 1

## 2017-12-27 MED ORDER — HYDROMORPHONE HCL 1 MG/ML IJ SOLN
1.0000 mg | Freq: Once | INTRAMUSCULAR | Status: AC
  Administered 2017-12-27: 1 mg via INTRAMUSCULAR
  Filled 2017-12-27: qty 1

## 2017-12-27 MED ORDER — KETOROLAC TROMETHAMINE 60 MG/2ML IM SOLN
15.0000 mg | Freq: Once | INTRAMUSCULAR | Status: AC
  Administered 2017-12-27: 15 mg via INTRAMUSCULAR
  Filled 2017-12-27: qty 2

## 2017-12-27 MED ORDER — OXYCODONE HCL 10 MG PO TABS: 10.0000 mg | ORAL_TABLET | Freq: Every evening | ORAL | 0 refills | Status: DC | PRN

## 2017-12-27 MED ORDER — ACETAMINOPHEN 500 MG PO TABS
1000.0000 mg | ORAL_TABLET | Freq: Once | ORAL | Status: AC
  Administered 2017-12-27: 1000 mg via ORAL
  Filled 2017-12-27: qty 2

## 2017-12-27 MED ORDER — HYDROMORPHONE HCL 4 MG PO TABS: 4.0000 mg | ORAL_TABLET | ORAL | 0 refills | Status: DC | PRN

## 2017-12-27 MED ORDER — DIAZEPAM 5 MG PO TABS: 5.0000 mg | ORAL_TABLET | Freq: Every evening | ORAL | 0 refills | Status: DC | PRN

## 2017-12-27 MED FILL — oxyCODONE HCL 10 MG TABS: 10 | 7 days supply | Qty: 7 | Fill #0

## 2017-12-27 MED FILL — HYDROmorphone HCL 4 MG TABS: 4 | 4 days supply | Qty: 25 | Fill #0

## 2017-12-27 MED FILL — diazePAM 5 MG TABS: 5 | 7 days supply | Qty: 7 | Fill #0

## 2017-12-27 NOTE — Discharge Instructions (Signed)
Return to the ED for leg numbness, weakness, or difficulty with peeing or pooping.

## 2017-12-27 NOTE — ED Triage Notes (Signed)
Pt arrived via [facility] c/o lower left back pain x2 weeks 9/10

## 2017-12-27 NOTE — ED Provider Notes (Signed)
[facility] EMERGENCY DEPARTMENT Provider Note   CSN: [id number] Arrival date & time: 12/27/17  1204     History   Chief Complaint Chief Complaint  Patient presents with  . Back Pain    HPI Travis Huff is a 57 y.o. male.  57 yo M with a chief complaint of left-sided low back pain that radiates down the leg.  This been a chronic issue for him.  He has had multiple spinal surgeries in the past on by Dr. [name].  Worsening over the past couple months or so.  That called the office today with worsening pain and so were given an appointment at 230.  The wife felt that they were unable to make it to the appointment due to gait instability and so they came to the ED for evaluation.  He has been having pain with bearing weight to the left leg and so has been avoiding and is made him somewhat unstable.  Last fell at the beginning of December.  They had seen their family doctor with worsening pain and he had recommended a myelogram which they were unable to get done because they did not feel he could lay flat for the procedure.  He has recently been diagnosed with a right lower extremity DVT and his physician that does spinal injections will not perform the procedure until he is cleared from DVT.  The patient had some incontinence at the beginning of the month that resolved, they thought it was due to his urgency to try and get to the bathroom and his limited mobility made him urinate prior to getting there.  This seems to have resolved.  Deny new weakness numbness or tingling.  Denies loss of balance denies loss of peritoneal sensation.  Denies fevers or chills.  Denies recent trauma.  The wife is requesting that he get a repeat DVT study as well as an MRI of the spine because it is difficult for him to make it to places as an outpatient with his back pain.  The history is provided by the patient.  Back Pain   Pertinent negatives include no chest pain, no fever, no headaches  and no abdominal pain.  Illness  This is a new problem. The current episode started more than 1 week ago. The problem occurs constantly. The problem has not changed since onset.Pertinent negatives include no chest pain, no abdominal pain, no headaches and no shortness of breath. The symptoms are aggravated by bending and walking. Nothing relieves the symptoms. He has tried nothing for the symptoms. The treatment provided no relief.    Past Medical History:  Diagnosis Date  . Arthritis   . Asthma   . GERD (gastroesophageal reflux disease)    uses tums  . Hypertension   . Viral pericarditis     Patient Active Problem List   Diagnosis Date Noted  . Intermittent claudication ([place]) 12/04/2017  . Cracking skin 12/04/2017  . Acute deep vein thrombosis (DVT) of calf muscle vein of right lower extremity 11/26/2017  . Primary osteoarthritis of first carpometacarpal joint of left hand 11/21/2017  . Cramping of hands 10/25/2017  . High frequency hearing loss, left 09/10/2017  . Tremor 09/10/2017  . Vertigo 09/10/2017  . Pain of left hip joint 07/31/2017  . Degeneration of thoracic intervertebral disc 05/30/2017  . Mixed hyperlipidemia 04/18/2017  . Elevated CK 04/18/2017  . Open dislocation of left second PIP of the foot 11/29/2016  . Scoliosis of thoracolumbar spine 06/23/2016  .  Headache 06/20/2016  . Anxiety, generalized 05/11/2016  . Primary osteoarthritis of right hip 05/25/2014  . Benign essential hypertension 03/26/2014  . Spinal stenosis, lumbar region, with neurogenic claudication 12/21/2013  . Primary osteoarthritis of left hip 07/07/2013  . Morbid obesity ([place]) 08/01/2012  . Precordial pain 05/24/2012  . S/P total knee arthroplasty 01/26/2012  . Erectile dysfunction 01/26/2012  . Annual physical exam 01/26/2012    Past Surgical History:  Procedure Laterality Date  . ABDOMINAL EXPOSURE N/A 06/23/2016   Procedure: ABDOMINAL EXPOSURE;  Surgeon: Rosetta Posner, MD;  Location:  [facility];  Service: Vascular;  Laterality: N/A;  . ANTERIOR LATERAL LUMBAR FUSION 4 LEVELS Right 06/23/2016   Procedure: Right  Lumbar two-three Lumbar three-four Lumbar four-five Anterolateral lumbar interbody fusion;  Surgeon: Erline Levine, MD;  Location: [facility];  Service: Neurosurgery;  Laterality: Right;  . ANTERIOR LUMBAR FUSION N/A 06/23/2016   Procedure: Lumbar five -sacral one  Anterior lumbar interbody fusion with Dr. Sherren Mocha Early for approach;  Surgeon: Erline Levine, MD;  Location: [facility];  Service: Neurosurgery;  Laterality: N/A;  . APPLICATION OF INTRAOPERATIVE CT SCAN N/A 06/26/2016   Procedure: APPLICATION OF INTRAOPERATIVE CT SCAN;  Surgeon: Erline Levine, MD;  Location: [facility];  Service: Neurosurgery;  Laterality: N/A;  . CARDIOVASCULAR STRESS TEST     Negative in May 2014  . COLONOSCOPY    . HERNIA REPAIR    . JOINT REPLACEMENT    . KNEE SURGERY    . POSTERIOR LUMBAR FUSION 4 LEVEL N/A 06/26/2016   Procedure: Thoracic ten to Pelvis fixation with AIRO;  Surgeon: Erline Levine, MD;  Location: [facility];  Service: Neurosurgery;  Laterality: N/A;  . TENDON REPAIR     right hand  . TOTAL HIP ARTHROPLASTY Right 05/25/2014   Procedure: RIGHT TOTAL HIP ARTHROPLASTY ANTERIOR APPROACH;  Surgeon: Rod Can, MD;  Location: [facility];  Service: Orthopedics;  Laterality: Right;  . TOTAL KNEE ARTHROPLASTY Bilateral         Home Medications    Prior to Admission medications   Medication Sig Start Date End Date Taking? Authorizing Provider  AMBULATORY NON FORMULARY MEDICATION Knee-high, medium compression, graduated compression stockings. Apply to lower extremities. Medium circ, long length 12/04/17   Silverio Decamp, MD  apixaban (ELIQUIS) 5 MG TABS tablet Take 1 tablet (5 mg total) by mouth 2 (two) times daily. 12/11/17   Silverio Decamp, MD  atenolol (TENORMIN) 100 MG tablet Take 1 tablet (100 mg total) by mouth daily. 12/11/17   Silverio Decamp, MD  calcium carbonate (TUMS  - DOSED IN MG ELEMENTAL CALCIUM) 500 MG chewable tablet Chew 3-4 tablets by mouth at bedtime as needed (stomach cramps).    [provider]  clobetasol ointment (TEMOVATE) 7.16 % Apply 1 application topically 2 (two) times daily. and cover with cotton socks 12/04/17   Silverio Decamp, MD  cyclobenzaprine (FLEXERIL) 10 MG tablet Take 2 tablets (20 mg total) by mouth at bedtime. 06/15/17   Silverio Decamp, MD  DULoxetine (CYMBALTA) 60 MG capsule TAKE 1 CAPSULE (60 MG TOTAL) BY MOUTH DAILY. 11/20/17   Silverio Decamp, MD  HYDROmorphone (DILAUDID) 2 MG tablet  11/21/17   [provider]  HYDROmorphone (DILAUDID) 4 MG tablet Take 1 tablet (4 mg total) by mouth every 4 (four) hours as needed for severe pain. 12/27/17   Deno Etienne, DO  lisinopril-hydrochlorothiazide (PRINZIDE,ZESTORETIC) 20-25 MG tablet TAKE 1 TABLET BY MOUTH DAILY. 07/30/17   Silverio Decamp,  MD  Oxycodone HCl 10 MG TABS Take 1 tablet (10 mg total) by mouth at bedtime as needed for severe pain. 12/27/17   Deno Etienne, DO  predniSONE (STERAPRED UNI-PAK 48 TAB) 10 MG (48) TBPK tablet Take by mouth daily. Use as directed for taper 12/17/17   Silverio Decamp, MD  primidone (MYSOLINE) 50 MG tablet 1 tab p.o. nightly for 1 week, may increase by 50 mg weekly until tremor controlled 12/11/17   Silverio Decamp, MD  sildenafil (VIAGRA) 100 MG tablet TAKE 1 TABLET BY MOUTH AS NEEDED FOR ERECTILE DYSFUNCTION *MAX DOSE IS 1 TABLET PER DAY* 04/18/17   Silverio Decamp, MD    Family History Family History  Adopted: Yes  Problem Relation Age of Onset  . Healthy Daughter     Social History Social History   Tobacco Use  . Smoking status: Former Smoker    Packs/day: 1.00    Years: 25.00    Pack years: 25.00    Types: Cigarettes    Last attempt to quit: 2000    Years since quitting: 19.9  . Smokeless tobacco: Current User    Types: Snuff  Substance Use Topics  . Alcohol use: Yes     Alcohol/week: 14.0 standard drinks    Types: 14 Cans of beer per week    Comment: 2 beers QD  . Drug use: No     Allergies   Morphine and related   Review of Systems Review of Systems  Constitutional: Negative for chills and fever.  HENT: Negative for congestion and facial swelling.   Eyes: Negative for discharge and visual disturbance.  Respiratory: Negative for shortness of breath.   Cardiovascular: Negative for chest pain and palpitations.  Gastrointestinal: Negative for abdominal pain, diarrhea and vomiting.  Musculoskeletal: Positive for back pain. Negative for arthralgias and myalgias.  Skin: Negative for color change and rash.  Neurological: Negative for tremors, syncope and headaches.  Psychiatric/Behavioral: Negative for confusion and dysphoric mood.     Physical Exam Updated Vital Signs BP (!) 160/89 (BP Location: Right Arm)   Pulse 61   Temp 98.8 F (37.1 C) (Oral)   Resp 20   Ht 6' (1.829 m)   Wt 134.7 kg   SpO2 99%   BMI 40.27 kg/m   Physical Exam Vitals signs and nursing note reviewed.  Constitutional:      Appearance: He is well-developed.  HENT:     Head: Normocephalic and atraumatic.  Eyes:     Pupils: Pupils are equal, round, and reactive to light.  Neck:     Musculoskeletal: Normal range of motion and neck supple.     Vascular: No JVD.  Cardiovascular:     Rate and Rhythm: Normal rate and regular rhythm.     Heart sounds: No murmur. No friction rub. No gallop.   Pulmonary:     Effort: No respiratory distress.     Breath sounds: No wheezing.  Abdominal:     General: There is no distension.     Tenderness: There is no guarding or rebound.  Musculoskeletal: Normal range of motion.        General: Tenderness present.     Right lower leg: Edema (1+) present.     Comments: Mild pain about the left SI joint area.  Pulse motor and sensation is intact bilaterally.  Trace edema with some erythema to the right lower extremity.  Skin:     Coloration: Skin is not pale.  Findings: No rash.  Neurological:     Mental Status: He is alert and oriented to person, place, and time.  Psychiatric:        Behavior: Behavior normal.      ED Treatments / Results  Labs (all labs ordered are listed, but only abnormal results are displayed) Labs Reviewed - No data to display  EKG None  Radiology No results found.  Procedures Procedures (including critical care time)  Medications Ordered in ED Medications  HYDROmorphone (DILAUDID) injection 1 mg (1 mg Intramuscular Given 12/27/17 1237)  acetaminophen (TYLENOL) tablet 1,000 mg (1,000 mg Oral Given 12/27/17 1239)  diazepam (VALIUM) tablet 5 mg (5 mg Oral Given 12/27/17 1238)  ketorolac (TORADOL) injection 15 mg (15 mg Intramuscular Given 12/27/17 1238)     Initial Impression / Assessment and Plan / ED Course  I have reviewed the triage vital signs and the nursing notes.  Pertinent labs & imaging results that were available during my care of the patient were reviewed by me and considered in my medical decision making (see chart for details).     57 yo M with a chief complaint of left-sided low back pain that radiates down the leg.  This been a chronic issue for him but worsening over the past couple months or so.  He was supposed to get a myelogram done but was unwilling to have that performed as an outpatient.  He was supposed to have a follow-up appointment with his neurosurgeon this afternoon but they felt the pain was too severe and came to the ED.  Denies loss of bowel or bladder denies loss of perirectal sensation.  Has chronic neuropathy to bilateral lower extremities but no change in weakness or numbness to the legs.  My exam without clonus reflexes are normal pulse motor and sensation is intact to bilateral lower extremities.  Family is demanding a neurosurgical consult before they leave I will discuss the case with them.  Dr. [name] evaluated the patient at bedside.   Did not feel that acute imaging was warranted but did feel that the patient needed continued outpatient pain management.  He will try to arrange for the patient to get a spinal injection.  In the meanwhile he requested that I prescribe the patient and his home regimen for pain which is 4 mg of Dilaudid every 4-6 hours.  Also taking a 10 mg Roxicodone tablet at night to help him sleep.  Written for prescription.  We will have him follow-up in the office.  Return precautions given.  3:12 PM:  I have discussed the diagnosis/risks/treatment options with the patient and family and believe the pt to be eligible for discharge home to follow-up with Neurosurgery. We also discussed returning to the ED immediately if new or worsening sx occur. We discussed the sx which are most concerning (e.g., sudden worsening pain, fever, inability to tolerate by mouth) that necessitate immediate return. Medications administered to the patient during their visit and any new prescriptions provided to the patient are listed below.  Medications given during this visit Medications  HYDROmorphone (DILAUDID) injection 1 mg (1 mg Intramuscular Given 12/27/17 1237)  acetaminophen (TYLENOL) tablet 1,000 mg (1,000 mg Oral Given 12/27/17 1239)  diazepam (VALIUM) tablet 5 mg (5 mg Oral Given 12/27/17 1238)  ketorolac (TORADOL) injection 15 mg (15 mg Intramuscular Given 12/27/17 1238)      The patient appears reasonably screen and/or stabilized for discharge and I doubt any other medical condition or other [facility] requiring further  screening, evaluation, or treatment in the ED at this time prior to discharge.    Final Clinical Impressions(s) / ED Diagnoses   Final diagnoses:  Sciatica of left side    ED Discharge Orders         Ordered    HYDROmorphone (DILAUDID) 4 MG tablet  Every 4 hours PRN     12/27/17 1511    Oxycodone HCl 10 MG TABS  At bedtime PRN     12/27/17 1511           Deno Etienne, DO 12/27/17 1513

## 2017-12-27 NOTE — ED Notes (Signed)
Patient verbalizes understanding of discharge instructions. Opportunity for questioning and answers were provided. 

## 2017-12-27 NOTE — Consult Note (Signed)
Reason for Consult:severe left buttock pain and falling in setting of DVT. Referring Physician:[name] is an 56 y.o. male.  HPI: 57 yo M with a chief complaint of left-sided low back pain that radiates down the leg.  This been a chronic issue for him.  He has had scoliosis corrective surgery.   Worsening over the past couple months or so.  That called the office today with worsening pain and so were given an appointment at 230.  The wife felt that they were unable to make it to the appointment due to gait instability and so they came to the ED for evaluation.  He has been having pain with bearing weight to the left leg and so has been avoiding and is made him somewhat unstable.  Last fell at the beginning of December.  They had seen their family doctor with worsening pain and he had recommended a myelogram which they were unable to get done because they did not feel he could lay flat for the procedure.  He has recently been diagnosed with a right lower extremity DVT and his physician that does spinal injections will not perform the procedure until he is cleared from DVT.  The patient had some incontinence at the beginning of the month that resolved, they thought it was due to his urgency to try and get to the bathroom and his limited mobility made him urinate prior to getting there.  This seems to have resolved.  Deny new weakness numbness or tingling.  Denies loss of balance denies loss of peritoneal sensation.  Denies fevers or chills.  Denies recent trauma.  The wife is requesting that he get a repeat DVT study as well as an MRI of the spine because it is difficult for him to make it to places as an outpatient with his back pain.  The history is provided by the patient.   Past Medical History:  Diagnosis Date  . Arthritis   . Asthma   . GERD (gastroesophageal reflux disease)    uses tums  . Hypertension   . Viral pericarditis     Past Surgical History:  Procedure  Laterality Date  . ABDOMINAL EXPOSURE N/A 06/23/2016   Procedure: ABDOMINAL EXPOSURE;  Surgeon: Rosetta Posner, MD;  Location: [facility];  Service: Vascular;  Laterality: N/A;  . ANTERIOR LATERAL LUMBAR FUSION 4 LEVELS Right 06/23/2016   Procedure: Right  Lumbar two-three Lumbar three-four Lumbar four-five Anterolateral lumbar interbody fusion;  Surgeon: Erline Levine, MD;  Location: [facility];  Service: Neurosurgery;  Laterality: Right;  . ANTERIOR LUMBAR FUSION N/A 06/23/2016   Procedure: Lumbar five -sacral one  Anterior lumbar interbody fusion with Dr. Sherren Mocha Early for approach;  Surgeon: Erline Levine, MD;  Location: [facility];  Service: Neurosurgery;  Laterality: N/A;  . APPLICATION OF INTRAOPERATIVE CT SCAN N/A 06/26/2016   Procedure: APPLICATION OF INTRAOPERATIVE CT SCAN;  Surgeon: Erline Levine, MD;  Location: [facility];  Service: Neurosurgery;  Laterality: N/A;  . CARDIOVASCULAR STRESS TEST     Negative in May 2014  . COLONOSCOPY    . HERNIA REPAIR    . JOINT REPLACEMENT    . KNEE SURGERY    . POSTERIOR LUMBAR FUSION 4 LEVEL N/A 06/26/2016   Procedure: Thoracic ten to Pelvis fixation with AIRO;  Surgeon: Erline Levine, MD;  Location: [facility];  Service: Neurosurgery;  Laterality: N/A;  . TENDON REPAIR     right hand  . TOTAL HIP ARTHROPLASTY Right 05/25/2014  Procedure: RIGHT TOTAL HIP ARTHROPLASTY ANTERIOR APPROACH;  Surgeon: Rod Can, MD;  Location: [facility];  Service: Orthopedics;  Laterality: Right;  . TOTAL KNEE ARTHROPLASTY Bilateral     Family History  Adopted: Yes  Problem Relation Age of Onset  . Healthy Daughter     Social History:  reports that he quit smoking about 19 years ago. His smoking use included cigarettes. He has a 25.00 pack-year smoking history. His smokeless tobacco use includes snuff. He reports current alcohol use of about 14.0 standard drinks of alcohol per week. He reports that he does not use drugs.  Allergies:  Allergies  Allergen Reactions  . Morphine And Related  Itching    Medications: I have reviewed the patient's current medications.  No results found for this or any previous visit (from the past 48 hour(s)).  No results found.  Review of Systems - Negative except per HPI    Blood pressure (!) 150/85, pulse (!) 59, temperature 98.8 F (37.1 C), temperature source Oral, resp. rate 20, height 6' (1.829 m), weight 134.7 kg, SpO2 99 %. Physical Exam  Constitutional: He is oriented to person, place, and time. He appears well-developed and well-nourished.  HENT:  Head: Normocephalic and atraumatic.  Eyes: Pupils are equal, round, and reactive to light. EOM are normal.  Neurological: He is alert and oriented to person, place, and time. He has normal strength and normal reflexes. A sensory deficit is present. No cranial nerve deficit. GCS eye subscore is 4. GCS verbal subscore is 5. GCS motor subscore is 6.  Bilateral lower extremity numbness to knees secondary to neuropathy  Pain over left buttock to palpation.  + SLR for left buttock pain.  No apparent weakness on exam.  Able to move side-to-side in bed.      Assessment/Plan: Patient with DVT on Eliquis, did well with injection in past, but not able to do so at present with DVT and need for anticoagulation.  Not a surgical candidate at present.  Will D/W Dr. Maryjean Ka about doing injection earlier.  To see Dr. Donnetta Hutching next week and can hopefully have Doppler test of lower extremity repeated to assess status of DVT.  OK to D/C home with dilaudid in daytime and oxycodone at night.  F/U with me next week.    Travis Shoals, MD 12/27/2017, 3:26 PM

## 2018-01-01 ENCOUNTER — Ambulatory Visit (HOSPITAL_COMMUNITY)
Admission: RE | Admit: 2018-01-01 | Discharge: 2018-01-01 | Disposition: A | Discharge status: Home / Self Care | Payer: 59 | Source: Ambulatory Visit | Attending: Vascular Surgery | Admitting: Vascular Surgery

## 2018-01-01 ENCOUNTER — Ambulatory Visit: Payer: 59 | Admitting: Vascular Surgery

## 2018-01-01 ENCOUNTER — Other Ambulatory Visit: Payer: Self-pay

## 2018-01-01 ENCOUNTER — Encounter: Payer: Self-pay | Admitting: Vascular Surgery

## 2018-01-01 VITALS — BP 103/64 | HR 75 | Temp 97.8°F | Resp 20 | Ht 72.0 in | Wt 296.0 lb

## 2018-01-01 DIAGNOSIS — I82449 Acute embolism and thrombosis of unspecified tibial vein: Secondary | ICD-10-CM

## 2018-01-01 DIAGNOSIS — I824Z1 Acute embolism and thrombosis of unspecified deep veins of right distal lower extremity: Secondary | ICD-10-CM | POA: Diagnosis present

## 2018-01-01 NOTE — Progress Notes (Signed)
Vascular and Vein Specialist of [place]  Patient name: Travis Huff MRN: [id number] DOB: [birth year] Sex: male  REASON FOR VISIT: Gus recent diagnosis of right gastrocnemius DVT  HPI: Travis Huff is a 57 y.o. male here today for follow-up.  Well-known to me from prior anterior exposure for lumbar fusion with Dr. [name] in 2018.  Family was noted to have discomfort in his calf and sound showed a left posterior tibial vein DVT.  Is never had any evidence of DVT in his right leg.  He is had new progressive back and left leg pain and has had some relief with spinal injections.  He recently was noted to have some discoloration in his feet and for this reason had a repeat venous duplex on November 26, 2017.  This showed evidence of DVT in the right gastrocnemius vein.  He did have some superficial venous clot as well.  He had not had any prior right leg documentation.  Past Medical History:  Diagnosis Date  . Arthritis   . Asthma   . Back pain   . GERD (gastroesophageal reflux disease)    uses tums  . Hypertension   . Viral pericarditis     Family History  Adopted: Yes  Problem Relation Age of Onset  . Healthy Daughter     SOCIAL HISTORY: Social History   Tobacco Use  . Smoking status: Former Smoker    Packs/day: 1.00    Years: 25.00    Pack years: 25.00    Types: Cigarettes    Last attempt to quit: 2000    Years since quitting: 20.0  . Smokeless tobacco: Current User    Types: Snuff  Substance Use Topics  . Alcohol use: Yes    Alcohol/week: 14.0 standard drinks    Types: 14 Cans of beer per week    Comment: 2 beers QD    Allergies  Allergen Reactions  . Morphine And Related Itching    Current Outpatient Medications  Medication Sig Dispense Refill  . AMBULATORY NON FORMULARY MEDICATION Knee-high, medium compression, graduated compression stockings. Apply to lower extremities. Medium circ, long length 1 each 0  . apixaban  (ELIQUIS) 5 MG TABS tablet Take 1 tablet (5 mg total) by mouth 2 (two) times daily. 180 tablet 3  . atenolol (TENORMIN) 100 MG tablet Take 1 tablet (100 mg total) by mouth daily. 30 tablet 3  . calcium carbonate (TUMS - DOSED IN MG ELEMENTAL CALCIUM) 500 MG chewable tablet Chew 3-4 tablets by mouth at bedtime as needed (stomach cramps).    . clobetasol ointment (TEMOVATE) 6.16 % Apply 1 application topically 2 (two) times daily. and cover with cotton socks 60 g 11  . cyclobenzaprine (FLEXERIL) 10 MG tablet Take 2 tablets (20 mg total) by mouth at bedtime. 180 tablet 3  . diazepam (VALIUM) 5 MG tablet Take 1 tablet (5 mg total) by mouth at bedtime as needed for muscle spasms. 7 tablet 0  . DULoxetine (CYMBALTA) 60 MG capsule TAKE 1 CAPSULE (60 MG TOTAL) BY MOUTH DAILY. 30 capsule 3  . HYDROmorphone (DILAUDID) 4 MG tablet Take 1 tablet (4 mg total) by mouth every 4 (four) hours as needed for severe pain. 25 tablet 0  . lisinopril-hydrochlorothiazide (PRINZIDE,ZESTORETIC) 20-25 MG tablet TAKE 1 TABLET BY MOUTH DAILY. 90 tablet 3  . Oxycodone HCl 10 MG TABS Take 1 tablet (10 mg total) by mouth at bedtime as needed for severe pain. 7 tablet 0  . sildenafil (VIAGRA)  100 MG tablet TAKE 1 TABLET BY MOUTH AS NEEDED FOR ERECTILE DYSFUNCTION *MAX DOSE IS 1 TABLET PER DAY* 10 tablet 11  . primidone (MYSOLINE) 50 MG tablet 1 tab p.o. nightly for 1 week, may increase by 50 mg weekly until tremor controlled (Patient not taking: Reported on 01/01/2018) 90 tablet 3   No current facility-administered medications for this visit.     REVIEW OF SYSTEMS:  [X]  denotes positive finding, [ ]  denotes negative finding Cardiac  Comments:  Chest pain or chest pressure:    Shortness of breath upon exertion:    Short of breath when lying flat:    Irregular heart rhythm:        Vascular    Pain in calf, thigh, or hip brought on by ambulation:    Pain in feet at night that wakes you up from your sleep:     Blood clot in  your veins: x   Leg swelling:  x         PHYSICAL EXAM: Vitals:   01/01/18 1503  BP: 103/64  Pulse: 75  Resp: 20  Temp: 97.8 F (36.6 C)  SpO2: 99%  Weight: 296 lb (134.3 kg)  Height: 6' (1.829 m)    GENERAL: The patient is a well-nourished male, in no acute distress. The vital signs are documented above. CARDIOVASCULAR: Palpable radial and palpable dorsalis pedis pulses bilaterally PULMONARY: There is good air exchange  MUSCULOSKELETAL: There are no major deformities or cyanosis. NEUROLOGIC: No focal weakness or paresthesias are detected. SKIN: There are no ulcers or rashes noted.  Does have some cyanosis and blanching which appears to be related to vasospasm to the skin bilaterally. PSYCHIATRIC: The patient has a normal affect.  DATA:  Had a repeat venous duplex today which showed clot in the right gastrocnemius vein and some clot in the left small saphenous vein.  MEDICAL ISSUES: Had a very long discussion with the patient and his wife present.  It is unclear as to whether this is acute clot in his vein.  He did not have any evidence of clot in July 2018 on the right leg.  The ultrasound that he obtained was really for cyanosis in his feet which would not be related to his right gastrocnemius clot.  He has been placed back on oral anticoagulation and I certainly would agree with this.  His main issue now is his severe back discomfort.  I feel that it is completely safe to discontinue his anticoagulation for several days prior to any sort of back injection required and then resuming it following the injection.  I would recommend eventual work-up with hematology to determine if he has a hypercoagulable state.  His physical exam and noninvasive studies from 12/14/2017 revealed normal arterial flow.  I discussed this with Dr. [name] by telephone and will plan for injections on 01/07/2018.  He will see [place] again on an as-needed basis    Rosetta Posner, MD [facility] Vascular and Vein  Specialists of [facility] Tel [phone] Pager [phone]

## 2018-01-02 DIAGNOSIS — I499 Cardiac arrhythmia, unspecified: Secondary | ICD-10-CM

## 2018-01-02 HISTORY — DX: Cardiac arrhythmia, unspecified: I49.9

## 2018-01-07 ENCOUNTER — Other Ambulatory Visit: Payer: Self-pay | Admitting: Sports Medicine

## 2018-01-07 ENCOUNTER — Telehealth: Payer: Self-pay

## 2018-01-07 DIAGNOSIS — M545 Low back pain: Secondary | ICD-10-CM | POA: Diagnosis not present

## 2018-01-07 DIAGNOSIS — F411 Generalized anxiety disorder: Secondary | ICD-10-CM

## 2018-01-07 MED ORDER — CYCLOBENZAPRINE HCL 10 MG PO TABS: 20.0000 mg | ORAL_TABLET | Freq: Two times a day (BID) | ORAL | 3 refills | Status: DC

## 2018-01-07 NOTE — Telephone Encounter (Signed)
We can do hip injections even on Eliquis.  It is spinal injections that you need to hold Eliquis for.  So the answer is yes.

## 2018-01-07 NOTE — Telephone Encounter (Signed)
Pt advised.

## 2018-01-07 NOTE — Telephone Encounter (Signed)
Pt called stating he has been off of Eliquis for 3 days and has spinal injection today with Dr. Maryjean Ka.   Wants to know is, since he is off his Eliquis, he can have fluid drawn off his hip and get a hip injection at his appt tomorrow before restarting Eliquis.   Please advise

## 2018-01-08 ENCOUNTER — Ambulatory Visit: Payer: 59 | Admitting: Sports Medicine

## 2018-01-08 ENCOUNTER — Ambulatory Visit (INDEPENDENT_AMBULATORY_CARE_PROVIDER_SITE_OTHER): Payer: 59

## 2018-01-08 DIAGNOSIS — L03115 Cellulitis of right lower limb: Secondary | ICD-10-CM | POA: Insufficient documentation

## 2018-01-08 DIAGNOSIS — Z96641 Presence of right artificial hip joint: Secondary | ICD-10-CM

## 2018-01-08 DIAGNOSIS — M1612 Unilateral primary osteoarthritis, left hip: Secondary | ICD-10-CM | POA: Diagnosis not present

## 2018-01-08 DIAGNOSIS — Z471 Aftercare following joint replacement surgery: Secondary | ICD-10-CM | POA: Diagnosis not present

## 2018-01-08 DIAGNOSIS — M25552 Pain in left hip: Secondary | ICD-10-CM | POA: Diagnosis not present

## 2018-01-08 DIAGNOSIS — M25452 Effusion, left hip: Secondary | ICD-10-CM | POA: Diagnosis not present

## 2018-01-08 DIAGNOSIS — M48062 Spinal stenosis, lumbar region with neurogenic claudication: Secondary | ICD-10-CM

## 2018-01-08 DIAGNOSIS — I82461 Acute embolism and thrombosis of right calf muscular vein: Secondary | ICD-10-CM | POA: Diagnosis not present

## 2018-01-08 MED ORDER — HYDROMORPHONE HCL 4 MG PO TABS: 4.0000 mg | ORAL_TABLET | Freq: Four times a day (QID) | ORAL | 0 refills | Status: DC | PRN

## 2018-01-08 MED ORDER — DOXYCYCLINE HYCLATE 100 MG PO TABS: 100.0000 mg | ORAL_TABLET | Freq: Two times a day (BID) | ORAL | 0 refills | Status: AC

## 2018-01-08 MED ORDER — OXYCODONE HCL ER 20 MG PO T12A: 20.0000 mg | EXTENDED_RELEASE_TABLET | Freq: Two times a day (BID) | ORAL | 0 refills | Status: DC

## 2018-01-08 MED FILL — HYDROmorphone HCL 4 MG TABS: 4 | 5 days supply | Qty: 20 | Fill #0

## 2018-01-08 MED FILL — DOXYCYCLINE HYCLATE 100 MG: 100 | 7 days supply | Qty: 14 | Fill #0

## 2018-01-08 MED FILL — OxyCONTIN 20 MG T12A: 20 | 30 days supply | Qty: 60 | Fill #0

## 2018-01-08 NOTE — Assessment & Plan Note (Signed)
Right hip is doing well post arthroplasty.

## 2018-01-08 NOTE — Progress Notes (Signed)
Subjective:    CC: Severe left hip pain  HPI: This is a pleasant 58 year old male, he has a history of right hip osteolysis, this occurred rapidly and progressively.  Ultimately ended up with hip arthroplasty doing well.  Now he has severe pain in his left hip, 6 months ago his x-ray looked okay with mild osteoarthritis.  Now he can barely walk, severe pain in the hip and groin.  No trauma.  In addition he has had severe back pain, recently had a lumbar epidural yesterday, so we do not expected to work yet.  He has been seeing his neurosurgeon for this as well.  He has chronic pain, currently taking Dilaudid 4 mg 3 times daily and oxycodone 10 mg in the evening.  This has increased considerably from his baseline pain medication level.  I reviewed the past medical history, family history, social history, surgical history, and allergies today and no changes were needed.  Please see the problem list section below in epic for further details.  Past Medical History: Past Medical History:  Diagnosis Date  . Arthritis   . Asthma   . Back pain   . GERD (gastroesophageal reflux disease)    uses tums  . Hypertension   . Viral pericarditis    Past Surgical History: Past Surgical History:  Procedure Laterality Date  . ABDOMINAL EXPOSURE N/A 06/23/2016   Procedure: ABDOMINAL EXPOSURE;  Surgeon: Rosetta Posner, MD;  Location: [place];  Service: Vascular;  Laterality: N/A;  . ANTERIOR LATERAL LUMBAR FUSION 4 LEVELS Right 06/23/2016   Procedure: Right  Lumbar two-three Lumbar three-four Lumbar four-five Anterolateral lumbar interbody fusion;  Surgeon: Erline Levine, MD;  Location: [place];  Service: Neurosurgery;  Laterality: Right;  . ANTERIOR LUMBAR FUSION N/A 06/23/2016   Procedure: Lumbar five -sacral one  Anterior lumbar interbody fusion with Dr. Sherren Mocha Early for approach;  Surgeon: Erline Levine, MD;  Location: [place];  Service: Neurosurgery;  Laterality: N/A;  . APPLICATION OF INTRAOPERATIVE CT SCAN N/A  06/26/2016   Procedure: APPLICATION OF INTRAOPERATIVE CT SCAN;  Surgeon: Erline Levine, MD;  Location: [place];  Service: Neurosurgery;  Laterality: N/A;  . CARDIOVASCULAR STRESS TEST     Negative in May 2014  . COLONOSCOPY    . HERNIA REPAIR    . JOINT REPLACEMENT    . KNEE SURGERY    . POSTERIOR LUMBAR FUSION 4 LEVEL N/A 06/26/2016   Procedure: Thoracic ten to Pelvis fixation with AIRO;  Surgeon: Erline Levine, MD;  Location: [place];  Service: Neurosurgery;  Laterality: N/A;  . TENDON REPAIR     right hand  . TOTAL HIP ARTHROPLASTY Right 05/25/2014   Procedure: RIGHT TOTAL HIP ARTHROPLASTY ANTERIOR APPROACH;  Surgeon: Rod Can, MD;  Location: [place];  Service: Orthopedics;  Laterality: Right;  . TOTAL KNEE ARTHROPLASTY Bilateral    Social History: Social History   Socioeconomic History  . Marital status: Married    Spouse name: Not on file  . Number of children: 2  . Years of education: 40  . Highest education level: Not on file  Occupational History  . Occupation: Public relations account executive  Social Needs  . Financial resource strain: Not on file  . Food insecurity:    Worry: Not on file    Inability: Not on file  . Transportation needs:    Medical: Not on file    Non-medical: Not on file  Tobacco Use  . Smoking status: Former Smoker    Packs/day: 1.00  Years: 25.00    Pack years: 25.00    Types: Cigarettes    Last attempt to quit: 2000    Years since quitting: 20.0  . Smokeless tobacco: Current User    Types: Snuff  Substance and Sexual Activity  . Alcohol use: Yes    Alcohol/week: 14.0 standard drinks    Types: 14 Cans of beer per week    Comment: 2 beers QD  . Drug use: No  . Sexual activity: Not on file  Lifestyle  . Physical activity:    Days per week: Not on file    Minutes per session: Not on file  . Stress: Not on file  Relationships  . Social connections:    Talks on phone: Not on file    Gets together: Not on file    Attends religious service: Not on file     Active member of club or organization: Not on file    Attends meetings of clubs or organizations: Not on file    Relationship status: Not on file  Other Topics Concern  . Not on file  Social History Narrative   Lives with wife in a one story home.  Has 2 children.  Works as a Public relations account executive.  Education: high school.    Family History: Family History  Adopted: Yes  Problem Relation Age of Onset  . Healthy Daughter    Allergies: Allergies  Allergen Reactions  . Morphine And Related Itching   Medications: See med rec.  Review of Systems: No fevers, chills, night sweats, weight loss, chest pain, or shortness of breath.   Objective:    General: Well Developed, well nourished, and in no acute distress.  Neuro: Alert and oriented x3, extra-ocular muscles intact, sensation grossly intact.  HEENT: Normocephalic, atraumatic, pupils equal round reactive to light, neck supple, no masses, no lymphadenopathy, thyroid nonpalpable.  Skin: Warm and dry, no rashes. Cardiac: Regular rate and rhythm, no murmurs rubs or gallops, no lower extremity edema.  Respiratory: Clear to auscultation bilaterally. Not using accessory muscles, speaking in full sentences. Left hip: Pain with internal rotation but surprisingly good movement.  Procedure: Real-time Ultrasound Guided Injection of left hip joint Device: GE Logiq E  Verbal informed consent obtained.  Time-out conducted.  Noted no overlying erythema, induration, or other signs of local infection.  Skin prepped in a sterile fashion.  Local anesthesia: Topical Ethyl chloride.  With sterile technique and under real time ultrasound guidance: Noted effusion and a fragmented appearance of the femoral head, for therapeutic purposes I did inject 1 cc Kenalog 40, 2 cc lidocaine, 2 cc bupivacaine. Completed without difficulty  Pain immediately resolved suggesting accurate placement of the medication.  Advised to call if fevers/chills, erythema,  induration, drainage, or persistent bleeding.  Images permanently stored and available for review in the ultrasound unit.  Impression: Technically successful ultrasound guided injection.    Impression and Recommendations:    Acute deep vein thrombosis (DVT) of calf muscle vein of right lower extremity Currently on Eliquis for a recent DVT, this is his second DVT so he will need lifelong anticoagulation. Adding a hypercoagulable panel per patient request and a second opinion from hematology.  History of total right hip arthroplasty Right hip is doing well post arthroplasty.  Primary osteoarthritis of left hip Left hip joint injection today with good relief, previous injection was 2-1/2 months ago.   His x-rays and CT do show rapidly progressive osteolysis of the left hip, he will need a hip  replacement, I spoke to his wife and they will proceed nonurgently to Dr. Lyla Glassing. When he does come back I would like to get a bone densitometry test.  Spinal stenosis, lumbar region, with neurogenic claudication Recently had an epidural yesterday. At this point this is failed back surgery syndrome, he does need chronic medical pain management. Using 4 mg of Dilaudid 3 times per day and 10 mg of oxycodone in the evening. Equally analgesic dose of OxyContin for 12 mg of Dilaudid and 10 mg of oxycodone daily is 40 total milligrams.  I did advise him that we only do 1 month of chronic pain management and he would need to find a pain provider afterwards. Have also asked him to ensure that we do not have too many cooks in the kitchen, and keep his spine complaints with his neurosurgeon.  Cellulitis of foot, right With some cracked skin on the heel. Continue topical betazole, avoid open wounds. Adding doxycycline for 7 days.  I spent 40 minutes with this patient, greater than 50% was face-to-face time counseling regarding the above diagnoses, this was separate from the time spent performing the above  procedure, specifically we discussed progressive hip osteoarthritis as well as pain management strategies. ___________________________________________ Gwen Her. Dianah Field, M.D., ABFM., CAQSM. Primary Care and Sports Medicine [facility]  Adjunct Professor of [place] [employer] [facility] [employer]

## 2018-01-08 NOTE — Assessment & Plan Note (Addendum)
Recently had an epidural yesterday. At this point this is failed back surgery syndrome, he does need chronic medical pain management. Using 4 mg of Dilaudid 3 times per day and 10 mg of oxycodone in the evening. Equally analgesic dose of OxyContin for 12 mg of Dilaudid and 10 mg of oxycodone daily is 40 total milligrams.  I did advise him that we only do 1 month of chronic pain management and he would need to find a pain provider afterwards. Have also asked him to ensure that we do not have too many cooks in the kitchen, and keep his spine complaints with his neurosurgeon.

## 2018-01-08 NOTE — Assessment & Plan Note (Signed)
Currently on Eliquis for a recent DVT, this is his second DVT so he will need lifelong anticoagulation. Adding a hypercoagulable panel per patient request and a second opinion from hematology.

## 2018-01-08 NOTE — Assessment & Plan Note (Addendum)
Left hip joint injection today with good relief, previous injection was 2-1/2 months ago.   His x-rays and CT do show rapidly progressive osteolysis of the left hip, he will need a hip replacement, I spoke to his wife and they will proceed nonurgently to Dr. Lyla Glassing. When he does come back I would like to get a bone densitometry test.

## 2018-01-08 NOTE — Assessment & Plan Note (Signed)
With some cracked skin on the heel. Continue topical betazole, avoid open wounds. Adding doxycycline for 7 days.

## 2018-01-10 ENCOUNTER — Encounter: Payer: Self-pay | Admitting: Sports Medicine

## 2018-01-10 DIAGNOSIS — R7983 Abnormal findings of blood amino-acid level: Secondary | ICD-10-CM | POA: Insufficient documentation

## 2018-01-10 DIAGNOSIS — E7211 Homocystinuria: Secondary | ICD-10-CM | POA: Insufficient documentation

## 2018-01-11 DIAGNOSIS — M1612 Unilateral primary osteoarthritis, left hip: Secondary | ICD-10-CM | POA: Diagnosis not present

## 2018-01-11 LAB — METHYLMALONIC ACID, SERUM: Methylmalonic Acid, Quant: 322 nmol/L — ABNORMAL HIGH (ref 87–318)

## 2018-01-12 LAB — PROTIME-INR
INR: 1
Prothrombin Time: 10 s (ref 9.0–11.5)

## 2018-01-12 LAB — FACTOR 5 ASSAY: COAG FACTOR V ACTIVITY: 103 % normal (ref 65–150)

## 2018-01-12 LAB — PROTEIN C, TOTAL AND FUNCTIONAL PANEL
PROTEIN C, ANTIGEN: 128 % (ref 70–140)
Protein C Activity: 195 % — ABNORMAL HIGH (ref 70–180)

## 2018-01-12 LAB — HOMOCYSTEINE: Homocysteine: 15.4 umol/L — ABNORMAL HIGH (ref <11.4)

## 2018-01-12 LAB — PROTHROMBIN GENE MUTATION

## 2018-01-12 LAB — APTT: aPTT: 30 s (ref 22–34)

## 2018-01-12 LAB — PROTEIN S, TOTAL AND FUNCTIONAL PANEL
PROTEIN S ANTIGEN, TOTAL: 120 % normal (ref 70–140)
Protein S Activity: 122 % (ref 70–150)

## 2018-01-12 LAB — ANTITHROMBIN III: AntiThromb III Func: 127 % activity — ABNORMAL HIGH (ref 80–120)

## 2018-01-28 MED FILL — CYCLOBENZAPRINE HCL 10 MG T: 10 | 90 days supply | Qty: 360 | Fill #0

## 2018-01-28 MED FILL — DULOXETINE HCL 60 MG CPEP: 60 | 30 days supply | Qty: 30 | Fill #2

## 2018-01-31 ENCOUNTER — Ambulatory Visit: Payer: 59 | Admitting: Sports Medicine

## 2018-01-31 ENCOUNTER — Encounter: Payer: Self-pay | Admitting: Sports Medicine

## 2018-01-31 DIAGNOSIS — L859 Epidermal thickening, unspecified: Secondary | ICD-10-CM

## 2018-01-31 NOTE — Assessment & Plan Note (Signed)
Aggressive trimming. Wife will continue trimming. Return in 2 weeks for this.

## 2018-01-31 NOTE — Progress Notes (Signed)
Subjective:    CC: Multiple issues  HPI: Travis Huff returns, he has rapidly progressive left hip osteoarthritis, with osteolysis of the femoral head, possibly due to avascular necrosis.  He is scheduled in the spring for total hip arthroplasty.  We converted his daily dose of continuous release oxycodone and Dilaudid into continuous release OxyContin with a few immediate release Dilaudid, and he is doing very well.  He has some splitting on the heels of his feet.  He had a DVT, and with his femoral head pathology we did a hypercoagulable work-up, it was mostly negative, homocysteine levels were a bit high.  I reviewed the past medical history, family history, social history, surgical history, and allergies today and no changes were needed.  Please see the problem list section below in epic for further details.  Past Medical History: Past Medical History:  Diagnosis Date  . Arthritis   . Asthma   . Back pain   . GERD (gastroesophageal reflux disease)    uses tums  . Hypertension   . Viral pericarditis    Past Surgical History: Past Surgical History:  Procedure Laterality Date  . ABDOMINAL EXPOSURE N/A 06/23/2016   Procedure: ABDOMINAL EXPOSURE;  Surgeon: Rosetta Posner, MD;  Location: [place];  Service: Vascular;  Laterality: N/A;  . ANTERIOR LATERAL LUMBAR FUSION 4 LEVELS Right 06/23/2016   Procedure: Right  Lumbar two-three Lumbar three-four Lumbar four-five Anterolateral lumbar interbody fusion;  Surgeon: Erline Levine, MD;  Location: [place];  Service: Neurosurgery;  Laterality: Right;  . ANTERIOR LUMBAR FUSION N/A 06/23/2016   Procedure: Lumbar five -sacral one  Anterior lumbar interbody fusion with Dr. Sherren Mocha Early for approach;  Surgeon: Erline Levine, MD;  Location: [place];  Service: Neurosurgery;  Laterality: N/A;  . APPLICATION OF INTRAOPERATIVE CT SCAN N/A 06/26/2016   Procedure: APPLICATION OF INTRAOPERATIVE CT SCAN;  Surgeon: Erline Levine, MD;  Location: [place];  Service: Neurosurgery;   Laterality: N/A;  . CARDIOVASCULAR STRESS TEST     Negative in May 2014  . COLONOSCOPY    . HERNIA REPAIR    . JOINT REPLACEMENT    . KNEE SURGERY    . POSTERIOR LUMBAR FUSION 4 LEVEL N/A 06/26/2016   Procedure: Thoracic ten to Pelvis fixation with AIRO;  Surgeon: Erline Levine, MD;  Location: [place];  Service: Neurosurgery;  Laterality: N/A;  . TENDON REPAIR     right hand  . TOTAL HIP ARTHROPLASTY Right 05/25/2014   Procedure: RIGHT TOTAL HIP ARTHROPLASTY ANTERIOR APPROACH;  Surgeon: Rod Can, MD;  Location: [place];  Service: Orthopedics;  Laterality: Right;  . TOTAL KNEE ARTHROPLASTY Bilateral    Social History: Social History   Socioeconomic History  . Marital status: Married    Spouse name: Not on file  . Number of children: 2  . Years of education: 21  . Highest education level: Not on file  Occupational History  . Occupation: Public relations account executive  Social Needs  . Financial resource strain: Not on file  . Food insecurity:    Worry: Not on file    Inability: Not on file  . Transportation needs:    Medical: Not on file    Non-medical: Not on file  Tobacco Use  . Smoking status: Former Smoker    Packs/day: 1.00    Years: 25.00    Pack years: 25.00    Types: Cigarettes    Last attempt to quit: 2000    Years since quitting: 20.0  . Smokeless tobacco: Current  User    Types: Snuff  Substance and Sexual Activity  . Alcohol use: Yes    Alcohol/week: 14.0 standard drinks    Types: 14 Cans of beer per week    Comment: 2 beers QD  . Drug use: No  . Sexual activity: Not on file  Lifestyle  . Physical activity:    Days per week: Not on file    Minutes per session: Not on file  . Stress: Not on file  Relationships  . Social connections:    Talks on phone: Not on file    Gets together: Not on file    Attends religious service: Not on file    Active member of club or organization: Not on file    Attends meetings of clubs or organizations: Not on file    Relationship  status: Not on file  Other Topics Concern  . Not on file  Social History Narrative   Lives with wife in a one story home.  Has 2 children.  Works as a Public relations account executive.  Education: high school.    Family History: Family History  Adopted: Yes  Problem Relation Age of Onset  . Healthy Daughter    Allergies: Allergies  Allergen Reactions  . Morphine And Related Itching   Medications: See med rec.  Review of Systems: No fevers, chills, night sweats, weight loss, chest pain, or shortness of breath.   Objective:    General: Well Developed, well nourished, and in no acute distress.  Neuro: Alert and oriented x3, extra-ocular muscles intact, sensation grossly intact.  HEENT: Normocephalic, atraumatic, pupils equal round reactive to light, neck supple, no masses, no lymphadenopathy, thyroid nonpalpable.  Skin: Warm and dry, no rashes.  There is significant splitting on the soles of his feet.  Nontender, no discharge. Cardiac: Regular rate and rhythm, no murmurs rubs or gallops, no lower extremity edema.  Respiratory: Clear to auscultation bilaterally. Not using accessory muscles, speaking in full sentences.  Procedure: Aggressive trimming of hyperkeratotic skin on the sole of his heels, 6 lesions. Risks, benefits, and alternatives explained and consent obtained. Time out conducted. Surface prepped with chloraprep. Area prepped in a sterile fashion. Excision performed with: Using a #15 blade I aggressively trimmed the hyperkeratotic and splitting skin on the sole of his heels down to a flat smooth surface.  Impression and Recommendations:    Hyperkeratosis of sole Aggressive trimming. Wife will continue trimming. Return in 2 weeks for this.  I spent 40 minutes with this patient, greater than 50% was face-to-face time counseling regarding the above diagnoses, specifically counseling regarding his femoral head osteolysis, and the possible role of elevated homocystine levels.  This was  specifically separate from the time spent trimming the hyperkeratotic skin on the sole of his heels. ___________________________________________ Gwen Her. Dianah Field, M.D., ABFM., CAQSM. Primary Care and Sports Medicine [facility]  Adjunct Professor of [place] [employer] [facility] [employer]

## 2018-02-05 ENCOUNTER — Ambulatory Visit: Payer: 59 | Admitting: Hematology

## 2018-02-05 ENCOUNTER — Other Ambulatory Visit: Payer: 59

## 2018-02-06 ENCOUNTER — Other Ambulatory Visit: Payer: Self-pay | Admitting: Sports Medicine

## 2018-02-06 DIAGNOSIS — M48062 Spinal stenosis, lumbar region with neurogenic claudication: Secondary | ICD-10-CM

## 2018-02-06 MED FILL — OxyCONTIN 20 MG T12A: 20 | 30 days supply | Qty: 60 | Fill #0

## 2018-02-07 ENCOUNTER — Other Ambulatory Visit: Payer: Self-pay | Admitting: Hematology

## 2018-02-07 ENCOUNTER — Inpatient Hospital Stay: Payer: 59 | Admitting: Hematology

## 2018-02-07 ENCOUNTER — Telehealth: Payer: Self-pay | Admitting: Hematology

## 2018-02-07 ENCOUNTER — Inpatient Hospital Stay: Payer: 59

## 2018-02-07 DIAGNOSIS — I82461 Acute embolism and thrombosis of right calf muscular vein: Secondary | ICD-10-CM

## 2018-02-07 MED FILL — diazePAM 5 MG TABS: 5 | 20 days supply | Qty: 60 | Fill #0

## 2018-02-07 NOTE — Telephone Encounter (Signed)
Called and spoke with patient regarding r/s his appt from 2/6 MD SICK.  I advised him that I would be back in touch once I was able to open Dr Lorette Ang schedule for 2/11 per him.

## 2018-02-08 ENCOUNTER — Telehealth: Payer: Self-pay | Admitting: Hematology

## 2018-02-08 NOTE — Telephone Encounter (Signed)
Received call from patient stating someone called him to resch his new patient appt for 2/6. advised patient that we needed to open MD sch and he wanted to sch appt for 2/11 at 1 pm to accommodate wife wanting to be here. She works 3rd [employer].

## 2018-02-11 NOTE — Progress Notes (Addendum)
[place] NOTE  Patient Care Team: Silverio Decamp, MD as PCP - General (Family Medicine) Erline Levine, MD as Consulting Physician (Neurosurgery)  HEME/ONC OVERVIEW: 1. Recurrent DVT's and PTE -07/2016: doppler of LLE showed DVT in the left posterior tibial vein; CTA chest showed acute PTE in the segmental and subsegmental pulmonary arteries in the lower lobes  -07/2016 - 01/2017: Xarelto -11/2017: doppler of B/L LE showed age-indeterminate DVT involving the R gastrocnemius, SVT involving the R small saphenous vein, and age-indeterminate SVT involving left small saphenous vein; prothrombin gene testing, Factor V Leiden, protein S and C levels, unremarkable   TREATMENT SUMMARY:  07/2016 - 01/2017: Xarelto x 6 months  11/2017 - present: Eliquis   PERTINENT NON-HEM/ONC PROBLEMS: 1. Diffuse osteoarthritis s/p right hip replacement and spinal surgeries, on chronic opioid medications   ASSESSMENT & PLAN:   Recurrent DVT's and PTE -I reviewed the patient's records in detail, including PCP and vascular surgery clinic notes, imaging studies and lab results -In summary, patient was diagnosed with acute DVT in the left posterior tibial vein as well as PTE in the segmental and subsegmental pulmonary arteries in the lower lobes in 07/2016 , for which he was started on Xarelto; he remained on anticoagulation until 01/2017; however, in 12/2017, patient returned with right lower extremity discoloration, and Doppler showed age-indeterminate DVT involving the right gastrocnemius, superficial venous thrombosis involving the right small saphenous vein as well as age indeterminant superficial venous thrombosis involving the left saphenous vein; patient has been on Eliquis since 11/2017 -I reviewed with the patient about the plan for care for recurrent DVT's -This last episode of blood clot appeared to be unprovoked. His PCP had ordered some hypercoagulable work-up, including  prothrombin gene, factor V Leiden, Protein C and S levels, which were unremarkable; In the absence of major contraindications, the goal of anticoagulation therapy is lifelong. While the antiphospholipid syndrome has not been ruled out, it would NOT change the management, and therefore I have deferred the testing for APLS. -Given that the patient is tolerating Eliquis well, I recommend continuing Eliquis for at least a total of 6 months, after which we can consider reducing the dose by half for secondary ppx -While it is not routinely done, CT CAP may be considered in the setting of unexplained DVT and PTE's to assess for occult malignancy, especially given the patient's increased cancer risk due to chronic tobacco use; the pros and cons were discussed, including possible false positive findings; after lengthy discussion, the patient declined CT scan at this time -I recommend the patient to use elastic compression stockings at 20-30 mmHg to reduce risks of chronic thrombophlebitis. -Finally, I reinforced the importance of preventive strategies such as avoiding hormonal supplement, avoiding cigarette smoking, keeping up-to-date with screening programs for early cancer detection, frequent ambulation for long distance travel and aggressive DVT prophylaxis in all surgical settings. -Should he need any interruption of the anticoagulation for elective procedures in the future, feel free to contact me regarding peri-operative management.  Intermittent lower extremity skin discoloration, rash -Patient's spouse is very concerned about intermittent lower extremity skin discoloration and purplish rash -Ddx unclear; patient has had multiple orthopedic surgeries, which can impede blood flow -In addition, the purplish rash appears to be over areas of pressure, which suggests vascular injury or superficial thrombophlebitis -I discussed at length with the patient's spouse that DVT's tend to affect larger veins in the  lower extremities and are not generally associated with focal areas of  skin discoloration in the feet; furthermore, the hypercoagulable conditions mentioned above are usually associated with VENOUS thrombosis, not arterial, and venous doppler would not be able to assess for small arterial vessel disease -ABI relatively unremarkable in 12/2017  -However, she has done extensive reading online and was concerned about possible connective tissue disorders and other autoimmune disorders, so I have placed referral to rheumatology for further evaluation  -I will defer further management to PCP   Health counseling -I spent some time counseling the patient on the importance of age-appropriate cancer screening, especially given the unprovoked superficial and deep vein thrombosis -He has hx of colonic polyps, and is overdue for colonoscopy -In addition, he quit smoking over 20 years ago, but has extensive tobacco use, so he would benefit from lung cancer screening -I strongly encouraged the patient to follow-up with the primary care physician for the aforementioned cancer screening, as well as any other age-appropriate cancer screening as indicated  Orders Placed This Encounter  Procedures  . CBC with Differential (Cancer Center Only)    Standing Status:   Future    Standing Expiration Date:   03/19/2019  . CMP ([facility] only)    Standing Status:   Future    Standing Expiration Date:   03/19/2019  . Ambulatory referral to Rheumatology    Referral Priority:   Routine    Referral Type:   Consultation    Referral Reason:   Specialty Services Required    Requested Specialty:   Rheumatology    Number of Visits Requested:   1    A total of more than 60 minutes were spent face-to-face with the patient during this encounter and over half of that time was spent on counseling and coordination of care as outlined above.    All questions were answered. The patient knows to call the clinic with any problems,  questions or concerns.  Return in 3-4 months for labs and clinic follow-up.   Tish Men, MD 02/12/2018 3:08 PM   CHIEF COMPLAINTS/PURPOSE OF CONSULTATION:  "I have had better days"  HISTORY OF PRESENTING ILLNESS:  Travis Huff 58 y.o. male is here because of recurrent RUL DVTs.  Patient reports that he had a history of LLE DVT and PTE in 07/2016, after he underwent knee replacement.  He was treated with Xarelto for 11-month, and anticoagulation was discontinued in 01/2017.  Around the time of Thanksgiving in 2019, his spouse noticed some discoloration of his right lower extremity, which led to Doppler of the right lower extremity that showed an age indeterminate DVT involving the right gastrocnemius, SVT involving the right saphenous vein, and age indeterminant SVT involving the left small saphenous vein.  Patient was started on Eliquis in 11/2017, and has been tolerating well so far without significant side effects.  He reports chronic swelling in bilateral feet and pain in the hip, and he is scheduled to have a hip replacement in 04/2018.  His spouse expressed some concern about intermittent reddish discoloration of the lower extremities, as well as 1 focal area of purplish discoloration over the dorsal surface of the foot bilaterally.  Patient denies any tenderness or abnormal bleeding from those areas.  He otherwise denies any fever, chill, weight change, night sweats, chest pain, dyspnea, abdominal pain, nausea, vomiting, diarrhea, or abnormal bleeding/bruising.  MEDICAL HISTORY:  Past Medical History:  Diagnosis Date  . Arthritis   . Asthma   . Back pain   . GERD (gastroesophageal reflux disease)  uses tums  . Hypertension   . Viral pericarditis     SURGICAL HISTORY: Past Surgical History:  Procedure Laterality Date  . ABDOMINAL EXPOSURE N/A 06/23/2016   Procedure: ABDOMINAL EXPOSURE;  Surgeon: Rosetta Posner, MD;  Location: [place];  Service: Vascular;  Laterality: N/A;  .  ANTERIOR LATERAL LUMBAR FUSION 4 LEVELS Right 06/23/2016   Procedure: Right  Lumbar two-three Lumbar three-four Lumbar four-five Anterolateral lumbar interbody fusion;  Surgeon: Erline Levine, MD;  Location: [place];  Service: Neurosurgery;  Laterality: Right;  . ANTERIOR LUMBAR FUSION N/A 06/23/2016   Procedure: Lumbar five -sacral one  Anterior lumbar interbody fusion with Dr. Sherren Mocha Early for approach;  Surgeon: Erline Levine, MD;  Location: [place];  Service: Neurosurgery;  Laterality: N/A;  . APPLICATION OF INTRAOPERATIVE CT SCAN N/A 06/26/2016   Procedure: APPLICATION OF INTRAOPERATIVE CT SCAN;  Surgeon: Erline Levine, MD;  Location: [place];  Service: Neurosurgery;  Laterality: N/A;  . CARDIOVASCULAR STRESS TEST     Negative in May 2014  . COLONOSCOPY    . HERNIA REPAIR    . JOINT REPLACEMENT    . KNEE SURGERY    . POSTERIOR LUMBAR FUSION 4 LEVEL N/A 06/26/2016   Procedure: Thoracic ten to Pelvis fixation with AIRO;  Surgeon: Erline Levine, MD;  Location: [place];  Service: Neurosurgery;  Laterality: N/A;  . TENDON REPAIR     right hand  . TOTAL HIP ARTHROPLASTY Right 05/25/2014   Procedure: RIGHT TOTAL HIP ARTHROPLASTY ANTERIOR APPROACH;  Surgeon: Rod Can, MD;  Location: [place];  Service: Orthopedics;  Laterality: Right;  . TOTAL KNEE ARTHROPLASTY Bilateral     SOCIAL HISTORY: Social History   Socioeconomic History  . Marital status: Married    Spouse name: Not on file  . Number of children: 2  . Years of education: 9  . Highest education level: Not on file  Occupational History  . Occupation: Public relations account executive  Social Needs  . Financial resource strain: Not on file  . Food insecurity:    Worry: Not on file    Inability: Not on file  . Transportation needs:    Medical: Not on file    Non-medical: Not on file  Tobacco Use  . Smoking status: Former Smoker    Packs/day: 1.00    Years: 25.00    Pack years: 25.00    Types: Cigarettes    Last attempt to quit: 2000    Years since  quitting: 20.1  . Smokeless tobacco: Current User    Types: Snuff  Substance and Sexual Activity  . Alcohol use: Yes    Alcohol/week: 14.0 standard drinks    Types: 14 Cans of beer per week    Comment: 2 beers QD  . Drug use: No  . Sexual activity: Not on file  Lifestyle  . Physical activity:    Days per week: Not on file    Minutes per session: Not on file  . Stress: Not on file  Relationships  . Social connections:    Talks on phone: Not on file    Gets together: Not on file    Attends religious service: Not on file    Active member of club or organization: Not on file    Attends meetings of clubs or organizations: Not on file    Relationship status: Not on file  . Intimate partner violence:    Fear of current or ex partner: Not on file    Emotionally abused: Not  on file    Physically abused: Not on file    Forced sexual activity: Not on file  Other Topics Concern  . Not on file  Social History Narrative   Lives with wife in a one story home.  Has 2 children.  Works as a Public relations account executive.  Education: high school.     FAMILY HISTORY: Family History  Adopted: Yes  Problem Relation Age of Onset  . Healthy Daughter     ALLERGIES:  is allergic to morphine and related.  MEDICATIONS:  Current Outpatient Medications  Medication Sig Dispense Refill  . AMBULATORY NON FORMULARY MEDICATION Knee-high, medium compression, graduated compression stockings. Apply to lower extremities. Medium circ, long length 1 each 0  . apixaban (ELIQUIS) 5 MG TABS tablet Take 1 tablet (5 mg total) by mouth 2 (two) times daily. 180 tablet 3  . atenolol (TENORMIN) 100 MG tablet Take 1 tablet (100 mg total) by mouth daily. 30 tablet 3  . calcium carbonate (TUMS - DOSED IN MG ELEMENTAL CALCIUM) 500 MG chewable tablet Chew 3-4 tablets by mouth at bedtime as needed (stomach cramps).    . clobetasol ointment (TEMOVATE) 4.23 % Apply 1 application topically 2 (two) times daily. and cover with cotton socks  60 g 11  . cyclobenzaprine (FLEXERIL) 10 MG tablet Take 2 tablets (20 mg total) by mouth 2 (two) times daily. 360 tablet 3  . diazepam (VALIUM) 5 MG tablet Take 1 tablet (5 mg total) by mouth at bedtime as needed for muscle spasms. 7 tablet 0  . DULoxetine (CYMBALTA) 60 MG capsule TAKE 1 CAPSULE (60 MG TOTAL) BY MOUTH DAILY. 30 capsule 3  . HYDROmorphone (DILAUDID) 4 MG tablet Take 1 tablet (4 mg total) by mouth every 6 (six) hours as needed for severe pain. 20 tablet 0  . lisinopril-hydrochlorothiazide (PRINZIDE,ZESTORETIC) 20-25 MG tablet TAKE 1 TABLET BY MOUTH DAILY. 90 tablet 3  . OXYCONTIN 20 MG 12 hr tablet TAKE 1 TABLET BY MOUTH EVERY 12 HOURS 60 tablet 0  . sildenafil (VIAGRA) 100 MG tablet TAKE 1 TABLET BY MOUTH AS NEEDED FOR ERECTILE DYSFUNCTION *MAX DOSE IS 1 TABLET PER DAY* 10 tablet 11   No current facility-administered medications for this visit.     REVIEW OF SYSTEMS:   Constitutional: ( - ) fevers, ( - )  chills , ( - ) night sweats Eyes: ( - ) blurriness of vision, ( - ) double vision, ( - ) watery eyes Ears, nose, mouth, throat, and face: ( - ) mucositis, ( - ) sore throat Respiratory: ( - ) cough, ( - ) dyspnea, ( - ) wheezes Cardiovascular: ( - ) palpitation, ( - ) chest discomfort, ( + ) lower extremity swelling Gastrointestinal:  ( - ) nausea, ( - ) heartburn, ( - ) change in bowel habits Skin: ( - ) abnormal skin rashes Lymphatics: ( - ) new lymphadenopathy, ( - ) easy bruising Neurological: ( - ) numbness, ( - ) tingling, ( - ) new weaknesses Behavioral/Psych: ( - ) mood change, ( - ) new changes  All other systems were reviewed with the patient and are negative.  PHYSICAL EXAMINATION: ECOG PERFORMANCE STATUS: 2 - Symptomatic, <50% confined to bed  Vitals:   02/12/18 1318  BP: 121/82  Pulse: 66  Resp: 18  Temp: 98.9 F (37.2 C)  SpO2: 97%   Filed Weights   02/12/18 1318  Weight: 296 lb 1.9 oz (134.3 kg)    GENERAL: alert, no distress, very  obese  sitting in a wheelchair  SKIN: one focal area of purplish discoloration over the left dorsal foot, suggestive of bruise from recent trauma; dry skin with flaking in bilateral lower extremities  EYES: conjunctiva are pink and non-injected, sclera clear OROPHARYNX: no exudate, no erythema; lips, buccal mucosa, and tongue normal  NECK: supple, non-tender LUNGS: clear to auscultation with normal breathing effort HEART: regular rate & rhythm, no murmurs, no lower extremity edema ABDOMEN: soft, non-tender, non-distended, normal bowel sounds Musculoskeletal: no cyanosis of digits and no clubbing  PSYCH: alert & oriented x 3, fluent speech  LABORATORY DATA:  I have reviewed the data as listed Lab Results  Component Value Date   WBC 7.9 02/12/2018   HGB 15.1 02/12/2018   HCT 45.5 02/12/2018   MCV 95.0 02/12/2018   PLT 254 02/12/2018   Lab Results  Component Value Date   NA 138 02/12/2018   K 4.3 02/12/2018   CL 100 02/12/2018   CO2 30 02/12/2018    RADIOGRAPHIC STUDIES: I have personally reviewed the radiological images as listed and agreed with the findings in the report. No results found.

## 2018-02-12 ENCOUNTER — Other Ambulatory Visit: Payer: Self-pay

## 2018-02-12 ENCOUNTER — Inpatient Hospital Stay (HOSPITAL_BASED_OUTPATIENT_CLINIC_OR_DEPARTMENT_OTHER): Payer: 59 | Admitting: Hematology

## 2018-02-12 ENCOUNTER — Encounter: Payer: Self-pay | Admitting: Hematology

## 2018-02-12 ENCOUNTER — Inpatient Hospital Stay: Payer: 59 | Attending: Hematology

## 2018-02-12 VITALS — BP 121/82 | HR 66 | Temp 98.9°F | Resp 18 | Ht 72.0 in | Wt 296.1 lb

## 2018-02-12 DIAGNOSIS — M1990 Unspecified osteoarthritis, unspecified site: Secondary | ICD-10-CM

## 2018-02-12 DIAGNOSIS — Z86010 Personal history of colonic polyps: Secondary | ICD-10-CM

## 2018-02-12 DIAGNOSIS — M25559 Pain in unspecified hip: Secondary | ICD-10-CM | POA: Insufficient documentation

## 2018-02-12 DIAGNOSIS — I82409 Acute embolism and thrombosis of unspecified deep veins of unspecified lower extremity: Secondary | ICD-10-CM | POA: Insufficient documentation

## 2018-02-12 DIAGNOSIS — Z7901 Long term (current) use of anticoagulants: Secondary | ICD-10-CM | POA: Insufficient documentation

## 2018-02-12 DIAGNOSIS — L989 Disorder of the skin and subcutaneous tissue, unspecified: Secondary | ICD-10-CM

## 2018-02-12 DIAGNOSIS — J45909 Unspecified asthma, uncomplicated: Secondary | ICD-10-CM | POA: Diagnosis not present

## 2018-02-12 DIAGNOSIS — Z87891 Personal history of nicotine dependence: Secondary | ICD-10-CM | POA: Insufficient documentation

## 2018-02-12 DIAGNOSIS — Z86718 Personal history of other venous thrombosis and embolism: Secondary | ICD-10-CM | POA: Diagnosis not present

## 2018-02-12 DIAGNOSIS — I10 Essential (primary) hypertension: Secondary | ICD-10-CM | POA: Diagnosis not present

## 2018-02-12 DIAGNOSIS — R21 Rash and other nonspecific skin eruption: Secondary | ICD-10-CM | POA: Diagnosis not present

## 2018-02-12 DIAGNOSIS — M549 Dorsalgia, unspecified: Secondary | ICD-10-CM | POA: Diagnosis not present

## 2018-02-12 DIAGNOSIS — K219 Gastro-esophageal reflux disease without esophagitis: Secondary | ICD-10-CM | POA: Insufficient documentation

## 2018-02-12 DIAGNOSIS — Z719 Counseling, unspecified: Secondary | ICD-10-CM | POA: Insufficient documentation

## 2018-02-12 DIAGNOSIS — Z79899 Other long term (current) drug therapy: Secondary | ICD-10-CM | POA: Diagnosis not present

## 2018-02-12 DIAGNOSIS — Z86711 Personal history of pulmonary embolism: Secondary | ICD-10-CM | POA: Diagnosis not present

## 2018-02-12 DIAGNOSIS — I82461 Acute embolism and thrombosis of right calf muscular vein: Secondary | ICD-10-CM

## 2018-02-12 LAB — CBC WITH DIFFERENTIAL (CANCER CENTER ONLY)
Abs Immature Granulocytes: 0.05 10*3/uL (ref 0.00–0.07)
Basophils Absolute: 0.1 10*3/uL (ref 0.0–0.1)
Basophils Relative: 1 %
Eosinophils Absolute: 0 10*3/uL (ref 0.0–0.5)
Eosinophils Relative: 1 %
HCT: 45.5 % (ref 39.0–52.0)
Hemoglobin: 15.1 g/dL (ref 13.0–17.0)
Immature Granulocytes: 1 %
Lymphocytes Relative: 12 %
Lymphs Abs: 1 10*3/uL (ref 0.7–4.0)
MCH: 31.5 pg (ref 26.0–34.0)
MCHC: 33.2 g/dL (ref 30.0–36.0)
MCV: 95 fL (ref 80.0–100.0)
Monocytes Absolute: 0.8 10*3/uL (ref 0.1–1.0)
Monocytes Relative: 11 %
Neutro Abs: 6 10*3/uL (ref 1.7–7.7)
Neutrophils Relative %: 74 %
Platelet Count: 254 10*3/uL (ref 150–400)
RBC: 4.79 MIL/uL (ref 4.22–5.81)
RDW: 13.3 % (ref 11.5–15.5)
WBC Count: 7.9 10*3/uL (ref 4.0–10.5)
nRBC: 0 % (ref 0.0–0.2)

## 2018-02-12 LAB — CMP (CANCER CENTER ONLY)
ALT: 19 U/L (ref 0–44)
AST: 22 U/L (ref 15–41)
Albumin: 4.4 g/dL (ref 3.5–5.0)
Alkaline Phosphatase: 88 U/L (ref 38–126)
Anion gap: 8 (ref 5–15)
BUN: 16 mg/dL (ref 6–20)
CO2: 30 mmol/L (ref 22–32)
Calcium: 10.3 mg/dL (ref 8.9–10.3)
Chloride: 100 mmol/L (ref 98–111)
Creatinine: 1.1 mg/dL (ref 0.61–1.24)
GFR, Est AFR Am: >60 mL/min (ref >60)
GFR, Estimated: >60 mL/min (ref >60)
Glucose, Bld: 134 mg/dL — ABNORMAL HIGH (ref 70–99)
Potassium: 4.3 mmol/L (ref 3.5–5.1)
Sodium: 138 mmol/L (ref 135–145)
Total Bilirubin: 0.5 mg/dL (ref 0.3–1.2)
Total Protein: 6.9 g/dL (ref 6.5–8.1)

## 2018-02-12 NOTE — Addendum Note (Signed)
Addended byTish Men on: 02/12/2018 03:08 PM   Modules accepted: Orders

## 2018-02-13 ENCOUNTER — Telehealth: Payer: Self-pay | Admitting: *Deleted

## 2018-02-13 NOTE — Telephone Encounter (Signed)
Medical records faxed to [facility]; [place] [phone]

## 2018-02-14 ENCOUNTER — Ambulatory Visit: Payer: 59 | Admitting: Sports Medicine

## 2018-02-14 ENCOUNTER — Ambulatory Visit: Payer: 59 | Admitting: Neurology

## 2018-02-14 DIAGNOSIS — L859 Epidermal thickening, unspecified: Secondary | ICD-10-CM | POA: Diagnosis not present

## 2018-02-14 DIAGNOSIS — M1612 Unilateral primary osteoarthritis, left hip: Secondary | ICD-10-CM

## 2018-02-14 DIAGNOSIS — M1812 Unilateral primary osteoarthritis of first carpometacarpal joint, left hand: Secondary | ICD-10-CM | POA: Diagnosis not present

## 2018-02-14 MED ORDER — TERBINAFINE HCL 250 MG PO TABS: 250.0000 mg | ORAL_TABLET | Freq: Every day | ORAL | 1 refills | Status: AC

## 2018-02-14 MED ORDER — CLOTRIMAZOLE-BETAMETHASONE 1-0.05 % EX CREA: 1.0000 "application " | TOPICAL_CREAM | Freq: Two times a day (BID) | CUTANEOUS | 0 refills | Status: DC

## 2018-02-14 MED FILL — CLOTRIMAZOLE-BETAMETHASONE: 1-0.05 | 14 days supply | Qty: 45 | Fill #0

## 2018-02-14 MED FILL — TERBINAFINE HCL 250 MG TAB: 250 | 90 days supply | Qty: 90 | Fill #0

## 2018-02-14 NOTE — Progress Notes (Addendum)
Subjective:    CC: Follow-up  HPI: Cayle returns, he has hyperkeratotic thickened skin on his soles, it is starting to slip.  He also has serpiginous, sometimes erythematous, sometimes papular rash on both of his feet, top and bottom with thickened yellow toenails.  I did an aggressive hyperkeratotic skin trimming at the last visit.  His left heel is doing extremely well, he still has some hyperkeratotic plaque on the right with persistent splitting of the skin.  In addition has been having some more pain in his left hand, he has end-stage wrist osteoarthritis, degenerative changes of the DRUJ, as well as thumb basal joint degenerative changes.  Today's thumb basal joint is more painful.  He had an injection some time ago without much improvement.  I reviewed the past medical history, family history, social history, surgical history, and allergies today and no changes were needed.  Please see the problem list section below in epic for further details.  Past Medical History: Past Medical History:  Diagnosis Date  . Arthritis   . Asthma   . Back pain   . GERD (gastroesophageal reflux disease)    uses tums  . Hypertension   . Viral pericarditis    Past Surgical History: Past Surgical History:  Procedure Laterality Date  . ABDOMINAL EXPOSURE N/A 06/23/2016   Procedure: ABDOMINAL EXPOSURE;  Surgeon: Rosetta Posner, MD;  Location: [place];  Service: Vascular;  Laterality: N/A;  . ANTERIOR LATERAL LUMBAR FUSION 4 LEVELS Right 06/23/2016   Procedure: Right  Lumbar two-three Lumbar three-four Lumbar four-five Anterolateral lumbar interbody fusion;  Surgeon: Erline Levine, MD;  Location: [place];  Service: Neurosurgery;  Laterality: Right;  . ANTERIOR LUMBAR FUSION N/A 06/23/2016   Procedure: Lumbar five -sacral one  Anterior lumbar interbody fusion with Dr. Sherren Mocha Early for approach;  Surgeon: Erline Levine, MD;  Location: [place];  Service: Neurosurgery;  Laterality: N/A;  . APPLICATION OF  INTRAOPERATIVE CT SCAN N/A 06/26/2016   Procedure: APPLICATION OF INTRAOPERATIVE CT SCAN;  Surgeon: Erline Levine, MD;  Location: [place];  Service: Neurosurgery;  Laterality: N/A;  . CARDIOVASCULAR STRESS TEST     Negative in May 2014  . COLONOSCOPY    . HERNIA REPAIR    . JOINT REPLACEMENT    . KNEE SURGERY    . POSTERIOR LUMBAR FUSION 4 LEVEL N/A 06/26/2016   Procedure: Thoracic ten to Pelvis fixation with AIRO;  Surgeon: Erline Levine, MD;  Location: [place];  Service: Neurosurgery;  Laterality: N/A;  . TENDON REPAIR     right hand  . TOTAL HIP ARTHROPLASTY Right 05/25/2014   Procedure: RIGHT TOTAL HIP ARTHROPLASTY ANTERIOR APPROACH;  Surgeon: Rod Can, MD;  Location: [place];  Service: Orthopedics;  Laterality: Right;  . TOTAL KNEE ARTHROPLASTY Bilateral    Social History: Social History   Socioeconomic History  . Marital status: Married    Spouse name: Not on file  . Number of children: 2  . Years of education: 59  . Highest education level: Not on file  Occupational History  . Occupation: Public relations account executive  Social Needs  . Financial resource strain: Not on file  . Food insecurity:    Worry: Not on file    Inability: Not on file  . Transportation needs:    Medical: Not on file    Non-medical: Not on file  Tobacco Use  . Smoking status: Former Smoker    Packs/day: 1.00    Years: 25.00    Pack years: 25.00  Types: Cigarettes    Last attempt to quit: 2000    Years since quitting: 20.1  . Smokeless tobacco: Current User    Types: Snuff  Substance and Sexual Activity  . Alcohol use: Yes    Alcohol/week: 14.0 standard drinks    Types: 14 Cans of beer per week    Comment: 2 beers QD  . Drug use: No  . Sexual activity: Not on file  Lifestyle  . Physical activity:    Days per week: Not on file    Minutes per session: Not on file  . Stress: Not on file  Relationships  . Social connections:    Talks on phone: Not on file    Gets together: Not on file    Attends  religious service: Not on file    Active member of club or organization: Not on file    Attends meetings of clubs or organizations: Not on file    Relationship status: Not on file  Other Topics Concern  . Not on file  Social History Narrative   Lives with wife in a one story home.  Has 2 children.  Works as a Public relations account executive.  Education: high school.    Family History: Family History  Adopted: Yes  Problem Relation Age of Onset  . Healthy Daughter    Allergies: Allergies  Allergen Reactions  . Morphine And Related Itching   Medications: See med rec.  Review of Systems: No fevers, chills, night sweats, weight loss, chest pain, or shortness of breath.   Objective:    General: Well Developed, well nourished, and in no acute distress.  Neuro: Alert and oriented x3, extra-ocular muscles intact, sensation grossly intact.  HEENT: Normocephalic, atraumatic, pupils equal round reactive to light, neck supple, no masses, no lymphadenopathy, thyroid nonpalpable.  Skin: Warm and dry, no rashes.  He continues to have thickened skin on the soles of his feet.  Cracking on the sole of the right foot, left sole looks much better after aggressive trimming of the last visit. Cardiac: Regular rate and rhythm, no murmurs rubs or gallops, no lower extremity edema.  Respiratory: Clear to auscultation bilaterally. Not using accessory muscles, speaking in full sentences. Left hand: Swollen thenar eminence, tender to palpation of the thumb basal joint, as well as at the radiocarpal joint.  Procedure: Aggressive trimming of hyperkeratotic skin on the sole of his right heel, 4 lesions. Risks, benefits, and alternatives explained and consent obtained. Time out conducted. Surface prepped with chloraprep. Area prepped in a sterile fashion. Excision performed with: Using a #10 blade I aggressively trimmed the hyperkeratotic and splitting skin on the sole of his heel down to a flat smooth surface.  Impression  and Recommendations:    Hyperkeratosis of sole Aggressive trimming of skin on the right sole, there is still a large split, this was trimmed down to healthy tissue, trying to avoid bleeding tissue as much as possible. Left foot skin looks normal, wife does need to be a little more aggressive with standing down his thick skin on the soles. I am convinced that he does have some onychomycosis and tinea pedis. Adding Lamisil for 6 months, as well as topical Lotrisone. We will recheck his LFTs after 1 month of therapy.  Primary osteoarthritis of left hip Left hip arthroplasty coming up, I did fill out his surgical clearance today. He has greater than 4 metabolic equivalents of cardiac tolerance, he has done well in the past with right hip arthroplasty, bilateral knee  arthroplasties and extensive lumbosacral fusion. He is on Eliquis for a DVT, he will stop the Eliquis 2 days before his surgery and restart postop day 1.  Primary osteoarthritis of first carpometacarpal joint of left hand No further injections, thumb spica for the next 2 weeks.  ___________________________________________ Gwen Her. Dianah Field, M.D., ABFM., CAQSM. Primary Care and Sports Medicine  [facility]  Adjunct Professor of [employer]

## 2018-02-14 NOTE — Assessment & Plan Note (Signed)
Left hip arthroplasty coming up, I did fill out his surgical clearance today. He has greater than 4 metabolic equivalents of cardiac tolerance, he has done well in the past with right hip arthroplasty, bilateral knee arthroplasties and extensive lumbosacral fusion. He is on Eliquis for a DVT, he will stop the Eliquis 2 days before his surgery and restart postop day 1.

## 2018-02-14 NOTE — Assessment & Plan Note (Signed)
No further injections, thumb spica for the next 2 weeks.

## 2018-02-14 NOTE — Assessment & Plan Note (Addendum)
Aggressive trimming of skin on the right sole, there is still a large split, this was trimmed down to healthy tissue, trying to avoid bleeding tissue as much as possible. Left foot skin looks normal, wife does need to be a little more aggressive with standing down his thick skin on the soles. I am convinced that he does have some onychomycosis and tinea pedis. Adding Lamisil for 6 months, as well as topical Lotrisone. We will recheck his LFTs after 1 month of therapy.

## 2018-02-21 MED FILL — LISINOPRIL-HCTZ 20-25 MG TA: 20-25 | 90 days supply | Qty: 90 | Fill #2

## 2018-02-27 MED FILL — DULOXETINE HCL 60 MG CPEP: 60 | 30 days supply | Qty: 30 | Fill #3

## 2018-03-06 ENCOUNTER — Other Ambulatory Visit: Payer: Self-pay | Admitting: Sports Medicine

## 2018-03-06 DIAGNOSIS — M48062 Spinal stenosis, lumbar region with neurogenic claudication: Secondary | ICD-10-CM

## 2018-03-07 MED FILL — OxyCONTIN 20 MG T12A: 20 | 30 days supply | Qty: 60 | Fill #0

## 2018-03-12 MED FILL — ELIQUIS 5 MG TABLET: 5 | 30 days supply | Qty: 60 | Fill #1

## 2018-03-14 ENCOUNTER — Ambulatory Visit: Payer: 59 | Admitting: Sports Medicine

## 2018-03-15 ENCOUNTER — Ambulatory Visit: Payer: Self-pay | Admitting: Orthopedic Surgery

## 2018-03-15 DIAGNOSIS — M1612 Unilateral primary osteoarthritis, left hip: Secondary | ICD-10-CM | POA: Diagnosis not present

## 2018-03-15 DIAGNOSIS — M25552 Pain in left hip: Secondary | ICD-10-CM | POA: Diagnosis not present

## 2018-03-15 NOTE — H&P (View-Only) (Signed)
TOTAL HIP ADMISSION H&P  Patient is admitted for left total hip arthroplasty.  Subjective:  Chief Complaint: left hip pain  HPI: Travis Huff, 58 y.o. male, has a history of pain and functional disability in the left hip(s) due to arthritis and patient has failed non-surgical conservative treatments for greater than 12 weeks to include NSAID's and/or analgesics, corticosteriod injections, flexibility and strengthening excercises, supervised PT with diminished ADL's post treatment, use of assistive devices, weight reduction as appropriate and activity modification.  Onset of symptoms was abrupt starting 1 years ago with rapidlly worsening course since that time.The patient noted no past surgery on the left hip(s).  Patient currently rates pain in the right hip at 10 out of 10 with activity. Patient has night pain, worsening of pain with activity and weight bearing, trendelenberg gait, pain that interfers with activities of daily living, pain with passive range of motion and crepitus. Patient has evidence of subchondral cysts, subchondral sclerosis, periarticular osteophytes, joint subluxation and joint space narrowing by imaging studies. This condition presents safety issues increasing the risk of falls. This patient has had avascular necrosis of the hip.  There is no current active infection.  Patient Active Problem List   Diagnosis Date Noted  . Recurrent deep vein thrombosis (DVT) ([place]) 02/12/2018  . Health counseling 02/12/2018  . Hyperkeratosis of sole 01/31/2018  . 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 Cellulitis of foot, right 01/08/2018  . Intermittent claudication ([place]) 12/04/2017  . Cracking skin 12/04/2017  . Acute deep vein thrombosis (DVT) of calf muscle vein of right lower extremity 11/26/2017  . Primary osteoarthritis of first carpometacarpal joint of left hand 11/21/2017  . Cramping of hands 10/25/2017  . High frequency hearing loss, left 09/10/2017  . Tremor 09/10/2017  .  Vertigo 09/10/2017  . Pain of left hip joint 07/31/2017  . Degeneration of thoracic intervertebral disc 05/30/2017  . Mixed hyperlipidemia 04/18/2017  . Elevated CK 04/18/2017  . Open dislocation of left second PIP of the foot 11/29/2016  . Scoliosis of thoracolumbar spine 06/23/2016  . Headache 06/20/2016  . Anxiety, generalized 05/11/2016  . History of total right hip arthroplasty 05/25/2014  . Benign essential hypertension 03/26/2014  . Spinal stenosis, lumbar region, with neurogenic claudication 12/21/2013  . Primary osteoarthritis of left hip 07/07/2013  . Morbid obesity ([place]) 08/01/2012  . Precordial pain 05/24/2012  . S/P total knee arthroplasty 01/26/2012  . Erectile dysfunction 01/26/2012  . Annual physical exam 01/26/2012   Past Medical History:  Diagnosis Date  . Arthritis   . Asthma   . Back pain   . GERD (gastroesophageal reflux disease)    uses tums  . Hypertension   . Viral pericarditis     Past Surgical History:  Procedure Laterality Date  . ABDOMINAL EXPOSURE N/A 06/23/2016   Procedure: ABDOMINAL EXPOSURE;  Surgeon: Rosetta Posner, MD;  Location: [facility];  Service: Vascular;  Laterality: N/A;  . ANTERIOR LATERAL LUMBAR FUSION 4 LEVELS Right 06/23/2016   Procedure: Right  Lumbar two-three Lumbar three-four Lumbar four-five Anterolateral lumbar interbody fusion;  Surgeon: Erline Levine, MD;  Location: [facility];  Service: Neurosurgery;  Laterality: Right;  . ANTERIOR LUMBAR FUSION N/A 06/23/2016   Procedure: Lumbar five -sacral one  Anterior lumbar interbody fusion with Dr. Sherren Mocha Early for approach;  Surgeon: Erline Levine, MD;  Location: [facility];  Service: Neurosurgery;  Laterality: N/A;  . APPLICATION OF INTRAOPERATIVE CT SCAN N/A 06/26/2016   Procedure: APPLICATION OF INTRAOPERATIVE CT SCAN;  Surgeon: [name],  Broadus John, MD;  Location: [place];  Service: Neurosurgery;  Laterality: N/A;  . CARDIOVASCULAR STRESS TEST     Negative in May 2014  . COLONOSCOPY    . HERNIA REPAIR     . JOINT REPLACEMENT    . KNEE SURGERY    . POSTERIOR LUMBAR FUSION 4 LEVEL N/A 06/26/2016   Procedure: Thoracic ten to Pelvis fixation with AIRO;  Surgeon: Erline Levine, MD;  Location: [facility];  Service: Neurosurgery;  Laterality: N/A;  . TENDON REPAIR     right hand  . TOTAL HIP ARTHROPLASTY Right 05/25/2014   Procedure: RIGHT TOTAL HIP ARTHROPLASTY ANTERIOR APPROACH;  Surgeon: Rod Can, MD;  Location: [facility];  Service: Orthopedics;  Laterality: Right;  . TOTAL KNEE ARTHROPLASTY Bilateral     Current Outpatient Medications  Medication Sig Dispense Refill Last Dose  . AMBULATORY NON FORMULARY MEDICATION Knee-high, medium compression, graduated compression stockings. Apply to lower extremities. Medium circ, long length 1 each 0 Taking  . apixaban (ELIQUIS) 5 MG TABS tablet Take 1 tablet (5 mg total) by mouth 2 (two) times daily. 180 tablet 3 Taking  . atenolol (TENORMIN) 100 MG tablet Take 1 tablet (100 mg total) by mouth daily. 30 tablet 3 Taking  . calcium carbonate (TUMS - DOSED IN MG ELEMENTAL CALCIUM) 500 MG chewable tablet Chew 3-4 tablets by mouth at bedtime as needed (stomach cramps).   Taking  . clobetasol ointment (TEMOVATE) 1.61 % Apply 1 application topically 2 (two) times daily. and cover with cotton socks 60 g 11 Taking  . clotrimazole-betamethasone (LOTRISONE) cream Apply 1 application topically 2 (two) times daily. 45 g 0   . cyclobenzaprine (FLEXERIL) 10 MG tablet Take 2 tablets (20 mg total) by mouth 2 (two) times daily. 360 tablet 3 Taking  . diazepam (VALIUM) 5 MG tablet Take 1 tablet (5 mg total) by mouth at bedtime as needed for muscle spasms. 7 tablet 0 Taking  . DULoxetine (CYMBALTA) 60 MG capsule TAKE 1 CAPSULE (60 MG TOTAL) BY MOUTH DAILY. 30 capsule 3 Taking  . HYDROmorphone (DILAUDID) 4 MG tablet Take 1 tablet (4 mg total) by mouth every 6 (six) hours as needed for severe pain. 20 tablet 0 Taking  . lisinopril-hydrochlorothiazide (PRINZIDE,ZESTORETIC) 20-25 MG  tablet TAKE 1 TABLET BY MOUTH DAILY. 90 tablet 3 Taking  . OXYCONTIN 20 MG 12 hr tablet TAKE 1 TABLET BY MOUTH EVERY 12 HOURS 60 tablet 0   . sildenafil (VIAGRA) 100 MG tablet TAKE 1 TABLET BY MOUTH AS NEEDED FOR ERECTILE DYSFUNCTION *MAX DOSE IS 1 TABLET PER DAY* 10 tablet 11 Taking  . terbinafine (LAMISIL) 250 MG tablet Take 1 tablet (250 mg total) by mouth daily. 90 tablet 1    No current facility-administered medications for this visit.    Allergies  Allergen Reactions  . Morphine And Related Itching    Social History   Tobacco Use  . Smoking status: Former Smoker    Packs/day: 1.00    Years: 25.00    Pack years: 25.00    Types: Cigarettes    Last attempt to quit: 2000    Years since quitting: 20.2  . Smokeless tobacco: Current User    Types: Snuff  Substance Use Topics  . Alcohol use: Yes    Alcohol/week: 14.0 standard drinks    Types: 14 Cans of beer per week    Comment: 2 beers QD    Family History  Adopted: Yes  Problem Relation Age of Onset  . Healthy Daughter  Review of Systems  Constitutional: Negative.   HENT: Negative.   Eyes: Negative.   Respiratory: Negative.   Cardiovascular: Negative.   Gastrointestinal: Negative.   Genitourinary: Negative.   Musculoskeletal: Positive for back pain and joint pain.  Skin: Negative.   Neurological: Positive for weakness.  Endo/Heme/Allergies: Negative.   Psychiatric/Behavioral: Negative.     Objective:  Physical Exam  Vitals reviewed. Constitutional: He is oriented to person, place, and time. He appears well-developed and well-nourished.  HENT:  Head: Normocephalic and atraumatic.  Eyes: Pupils are equal, round, and reactive to light. Conjunctivae and EOM are normal.  Neck: Normal range of motion. Neck supple.  Cardiovascular: Normal rate, regular rhythm and intact distal pulses.  Respiratory: Effort normal. No respiratory distress.  GI: Soft. He exhibits no distension.  Genitourinary:    Genitourinary  Comments: deferred   Musculoskeletal:     Left hip: He exhibits decreased range of motion, decreased strength and bony tenderness.  Neurological: He is alert and oriented to person, place, and time. He has normal reflexes.  Skin: Skin is warm.  Psychiatric: He has a normal mood and affect. His behavior is normal. Judgment and thought content normal.    Vital signs in last 24 hours: @VSRANGES @  Labs:   Estimated body mass index is 40.16 kg/m as calculated from the following:   Height as of 02/12/18: 6' (1.829 m).   Weight as of 02/12/18: 134.3 kg.   Imaging Review Plain radiographs demonstrate severe degenerative joint disease of the left hip(s). The bone quality appears to be poor for age and reported activity level.      Assessment/Plan:  End stage arthritis, left hip(s)  The patient history, physical examination, clinical judgement of the provider and imaging studies are consistent with end stage degenerative joint disease of the left hip(s) and total hip arthroplasty is deemed medically necessary. The treatment options including medical management, injection therapy, arthroscopy and arthroplasty were discussed at length. The risks and benefits of total hip arthroplasty were presented and reviewed. The risks due to aseptic loosening, infection, stiffness, dislocation/subluxation,  thromboembolic complications and other imponderables were discussed.  The patient acknowledged the explanation, agreed to proceed with the plan and consent was signed. Patient is being admitted for inpatient treatment for surgery, pain control, PT, OT, prophylactic antibiotics, VTE prophylaxis, progressive ambulation and ADL's and discharge planning.The patient is planning to be discharged home with HEP. H/o DVT and PE. (+) Eliquis.   Anticipated LOS equal to or greater than 2 midnights due to - Age 63 and older with one or more of the following:  - Obesity  - Expected need for hospital services (PT,  OT, Nursing) required for safe  discharge  - Anticipated need for postoperative skilled nursing care or inpatient rehab  - Active co-morbidities: None OR   - Unanticipated findings during/Post Surgery: None  - Patient is a high risk of re-admission due to: None

## 2018-03-15 NOTE — H&P (Signed)
TOTAL HIP ADMISSION H&P  Patient is admitted for left total hip arthroplasty.  Subjective:  Chief Complaint: left hip pain  HPI: Travis Huff, 58 y.o. male, has a history of pain and functional disability in the left hip(s) due to arthritis and patient has failed non-surgical conservative treatments for greater than 12 weeks to include NSAID's and/or analgesics, corticosteriod injections, flexibility and strengthening excercises, supervised PT with diminished ADL's post treatment, use of assistive devices, weight reduction as appropriate and activity modification.  Onset of symptoms was abrupt starting 1 years ago with rapidlly worsening course since that time.The patient noted no past surgery on the left hip(s).  Patient currently rates pain in the right hip at 10 out of 10 with activity. Patient has night pain, worsening of pain with activity and weight bearing, trendelenberg gait, pain that interfers with activities of daily living, pain with passive range of motion and crepitus. Patient has evidence of subchondral cysts, subchondral sclerosis, periarticular osteophytes, joint subluxation and joint space narrowing by imaging studies. This condition presents safety issues increasing the risk of falls. This patient has had avascular necrosis of the hip.  There is no current active infection.  Patient Active Problem List   Diagnosis Date Noted  . Recurrent deep vein thrombosis (DVT) ([place]) 02/12/2018  . Health counseling 02/12/2018  . Hyperkeratosis of sole 01/31/2018  . Hyperhomocystinemia ([place]) 01/10/2018  . Cellulitis of foot, right 01/08/2018  . Intermittent claudication ([place]) 12/04/2017  . Cracking skin 12/04/2017  . Acute deep vein thrombosis (DVT) of calf muscle vein of right lower extremity 11/26/2017  . Primary osteoarthritis of first carpometacarpal joint of left hand 11/21/2017  . Cramping of hands 10/25/2017  . High frequency hearing loss, left 09/10/2017  . Tremor 09/10/2017  .  Vertigo 09/10/2017  . Pain of left hip joint 07/31/2017  . Degeneration of thoracic intervertebral disc 05/30/2017  . Mixed hyperlipidemia 04/18/2017  . Elevated CK 04/18/2017  . Open dislocation of left second PIP of the foot 11/29/2016  . Scoliosis of thoracolumbar spine 06/23/2016  . Headache 06/20/2016  . Anxiety, generalized 05/11/2016  . History of total right hip arthroplasty 05/25/2014  . Benign essential hypertension 03/26/2014  . Spinal stenosis, lumbar region, with neurogenic claudication 12/21/2013  . Primary osteoarthritis of left hip 07/07/2013  . Morbid obesity ([place]) 08/01/2012  . Precordial pain 05/24/2012  . S/P total knee arthroplasty 01/26/2012  . Erectile dysfunction 01/26/2012  . Annual physical exam 01/26/2012   Past Medical History:  Diagnosis Date  . Arthritis   . Asthma   . Back pain   . GERD (gastroesophageal reflux disease)    uses tums  . Hypertension   . Viral pericarditis     Past Surgical History:  Procedure Laterality Date  . ABDOMINAL EXPOSURE N/A 06/23/2016   Procedure: ABDOMINAL EXPOSURE;  Surgeon: Rosetta Posner, MD;  Location: [facility];  Service: Vascular;  Laterality: N/A;  . ANTERIOR LATERAL LUMBAR FUSION 4 LEVELS Right 06/23/2016   Procedure: Right  Lumbar two-three Lumbar three-four Lumbar four-five Anterolateral lumbar interbody fusion;  Surgeon: Erline Levine, MD;  Location: [facility];  Service: Neurosurgery;  Laterality: Right;  . ANTERIOR LUMBAR FUSION N/A 06/23/2016   Procedure: Lumbar five -sacral one  Anterior lumbar interbody fusion with Dr. Sherren Mocha Early for approach;  Surgeon: Erline Levine, MD;  Location: [facility];  Service: Neurosurgery;  Laterality: N/A;  . APPLICATION OF INTRAOPERATIVE CT SCAN N/A 06/26/2016   Procedure: APPLICATION OF INTRAOPERATIVE CT SCAN;  Surgeon: [name],  Broadus John, MD;  Location: [facility];  Service: Neurosurgery;  Laterality: N/A;  . CARDIOVASCULAR STRESS TEST     Negative in May 2014  . COLONOSCOPY    . HERNIA REPAIR     . JOINT REPLACEMENT    . KNEE SURGERY    . POSTERIOR LUMBAR FUSION 4 LEVEL N/A 06/26/2016   Procedure: Thoracic ten to Pelvis fixation with AIRO;  Surgeon: Erline Levine, MD;  Location: [facility];  Service: Neurosurgery;  Laterality: N/A;  . TENDON REPAIR     right hand  . TOTAL HIP ARTHROPLASTY Right 05/25/2014   Procedure: RIGHT TOTAL HIP ARTHROPLASTY ANTERIOR APPROACH;  Surgeon: Rod Can, MD;  Location: [facility];  Service: Orthopedics;  Laterality: Right;  . TOTAL KNEE ARTHROPLASTY Bilateral     Current Outpatient Medications  Medication Sig Dispense Refill Last Dose  . AMBULATORY NON FORMULARY MEDICATION Knee-high, medium compression, graduated compression stockings. Apply to lower extremities. Medium circ, long length 1 each 0 Taking  . apixaban (ELIQUIS) 5 MG TABS tablet Take 1 tablet (5 mg total) by mouth 2 (two) times daily. 180 tablet 3 Taking  . atenolol (TENORMIN) 100 MG tablet Take 1 tablet (100 mg total) by mouth daily. 30 tablet 3 Taking  . calcium carbonate (TUMS - DOSED IN MG ELEMENTAL CALCIUM) 500 MG chewable tablet Chew 3-4 tablets by mouth at bedtime as needed (stomach cramps).   Taking  . clobetasol ointment (TEMOVATE) 3.82 % Apply 1 application topically 2 (two) times daily. and cover with cotton socks 60 g 11 Taking  . clotrimazole-betamethasone (LOTRISONE) cream Apply 1 application topically 2 (two) times daily. 45 g 0   . cyclobenzaprine (FLEXERIL) 10 MG tablet Take 2 tablets (20 mg total) by mouth 2 (two) times daily. 360 tablet 3 Taking  . diazepam (VALIUM) 5 MG tablet Take 1 tablet (5 mg total) by mouth at bedtime as needed for muscle spasms. 7 tablet 0 Taking  . DULoxetine (CYMBALTA) 60 MG capsule TAKE 1 CAPSULE (60 MG TOTAL) BY MOUTH DAILY. 30 capsule 3 Taking  . HYDROmorphone (DILAUDID) 4 MG tablet Take 1 tablet (4 mg total) by mouth every 6 (six) hours as needed for severe pain. 20 tablet 0 Taking  . lisinopril-hydrochlorothiazide (PRINZIDE,ZESTORETIC) 20-25 MG  tablet TAKE 1 TABLET BY MOUTH DAILY. 90 tablet 3 Taking  . OXYCONTIN 20 MG 12 hr tablet TAKE 1 TABLET BY MOUTH EVERY 12 HOURS 60 tablet 0   . sildenafil (VIAGRA) 100 MG tablet TAKE 1 TABLET BY MOUTH AS NEEDED FOR ERECTILE DYSFUNCTION *MAX DOSE IS 1 TABLET PER DAY* 10 tablet 11 Taking  . terbinafine (LAMISIL) 250 MG tablet Take 1 tablet (250 mg total) by mouth daily. 90 tablet 1    No current facility-administered medications for this visit.    Allergies  Allergen Reactions  . Morphine And Related Itching    Social History   Tobacco Use  . Smoking status: Former Smoker    Packs/day: 1.00    Years: 25.00    Pack years: 25.00    Types: Cigarettes    Last attempt to quit: 2000    Years since quitting: 20.2  . Smokeless tobacco: Current User    Types: Snuff  Substance Use Topics  . Alcohol use: Yes    Alcohol/week: 14.0 standard drinks    Types: 14 Cans of beer per week    Comment: 2 beers QD    Family History  Adopted: Yes  Problem Relation Age of Onset  . Healthy Daughter  Review of Systems  Constitutional: Negative.   HENT: Negative.   Eyes: Negative.   Respiratory: Negative.   Cardiovascular: Negative.   Gastrointestinal: Negative.   Genitourinary: Negative.   Musculoskeletal: Positive for back pain and joint pain.  Skin: Negative.   Neurological: Positive for weakness.  Endo/Heme/Allergies: Negative.   Psychiatric/Behavioral: Negative.     Objective:  Physical Exam  Vitals reviewed. Constitutional: He is oriented to person, place, and time. He appears well-developed and well-nourished.  HENT:  Head: Normocephalic and atraumatic.  Eyes: Pupils are equal, round, and reactive to light. Conjunctivae and EOM are normal.  Neck: Normal range of motion. Neck supple.  Cardiovascular: Normal rate, regular rhythm and intact distal pulses.  Respiratory: Effort normal. No respiratory distress.  GI: Soft. He exhibits no distension.  Genitourinary:    Genitourinary  Comments: deferred   Musculoskeletal:     Left hip: He exhibits decreased range of motion, decreased strength and bony tenderness.  Neurological: He is alert and oriented to person, place, and time. He has normal reflexes.  Skin: Skin is warm.  Psychiatric: He has a normal mood and affect. His behavior is normal. Judgment and thought content normal.    Vital signs in last 24 hours: @VSRANGES @  Labs:   Estimated body mass index is 40.16 kg/m as calculated from the following:   Height as of 02/12/18: 6' (1.829 m).   Weight as of 02/12/18: 134.3 kg.   Imaging Review Plain radiographs demonstrate severe degenerative joint disease of the left hip(s). The bone quality appears to be poor for age and reported activity level.      Assessment/Plan:  End stage arthritis, left hip(s)  The patient history, physical examination, clinical judgement of the provider and imaging studies are consistent with end stage degenerative joint disease of the left hip(s) and total hip arthroplasty is deemed medically necessary. The treatment options including medical management, injection therapy, arthroscopy and arthroplasty were discussed at length. The risks and benefits of total hip arthroplasty were presented and reviewed. The risks due to aseptic loosening, infection, stiffness, dislocation/subluxation,  thromboembolic complications and other imponderables were discussed.  The patient acknowledged the explanation, agreed to proceed with the plan and consent was signed. Patient is being admitted for inpatient treatment for surgery, pain control, PT, OT, prophylactic antibiotics, VTE prophylaxis, progressive ambulation and ADL's and discharge planning.The patient is planning to be discharged home with HEP. H/o DVT and PE. (+) Eliquis.   Anticipated LOS equal to or greater than 2 midnights due to - Age 89 and older with one or more of the following:  - Obesity  - Expected need for hospital services (PT,  OT, Nursing) required for safe  discharge  - Anticipated need for postoperative skilled nursing care or inpatient rehab  - Active co-morbidities: None OR   - Unanticipated findings during/Post Surgery: None  - Patient is a high risk of re-admission due to: None

## 2018-03-18 ENCOUNTER — Other Ambulatory Visit (HOSPITAL_COMMUNITY): Payer: Self-pay | Admitting: Orthopedic Surgery

## 2018-03-18 DIAGNOSIS — M25552 Pain in left hip: Secondary | ICD-10-CM

## 2018-03-18 NOTE — Progress Notes (Signed)
NEED ORDERS IN Epic FOR 4-1 SURGERY

## 2018-03-19 ENCOUNTER — Ambulatory Visit: Payer: Self-pay | Admitting: Orthopedic Surgery

## 2018-03-19 ENCOUNTER — Telehealth (HOSPITAL_COMMUNITY): Payer: Self-pay | Admitting: Radiology

## 2018-03-19 NOTE — Telephone Encounter (Signed)
Tried to call patient and wife to scheduled IVC filter placement on all phone numbers available on 03/18/18 @ 1600. No answer and no VM. Will try again today JM

## 2018-03-21 ENCOUNTER — Other Ambulatory Visit: Payer: Self-pay | Admitting: Sports Medicine

## 2018-03-21 DIAGNOSIS — M48062 Spinal stenosis, lumbar region with neurogenic claudication: Secondary | ICD-10-CM

## 2018-03-21 DIAGNOSIS — M1612 Unilateral primary osteoarthritis, left hip: Secondary | ICD-10-CM

## 2018-03-21 MED FILL — HYDROmorphone HCL 4 MG TABS: 4 | 30 days supply | Qty: 120 | Fill #0

## 2018-03-21 NOTE — Assessment & Plan Note (Signed)
Unfortunately left hip arthroplasty was canceled due to the current COVID-19 outbreak. He has essentially no femoral head and I do think that this procedure is NOT elective, it is urgently necessary. I am going to refill his Dilaudid for use up to 4 pills daily for a month supply. He will stop Eliquis 2 days before surgery and restart postop day 1.

## 2018-03-22 NOTE — Patient Instructions (Addendum)
GEZA BERANEK  03/22/2018   Your procedure is scheduled on: 03-27-18    Report to [facility] Main  Entrance    Report to [place] at 5:30 AM    Call this number if you have problems the morning of surgery [phone]    Remember: Do not eat food or drink liquids :After Midnight.    BRUSH YOUR TEETH MORNING OF SURGERY AND RINSE YOUR MOUTH OUT, NO CHEWING GUM CANDY OR MINTS.     Take these medicines the morning of surgery with A SIP OF WATER: Atenolol (Tenormin), and OxyContin if needed                                You may not have any metal on your body including hair pins and              piercings  Do not wear jewelry, cologne, lotions, powders or deodorant             Men may shave face and neck.   Do not bring valuables to the hospital. [place].  Contacts, dentures or bridgework may not be worn into surgery.  Leave suitcase in the car. After surgery it may be brought to your room.     Patients discharged the day of surgery will not be allowed to drive home. IF YOU ARE HAVING SURGERY AND GOING HOME THE SAME DAY, YOU MUST HAVE AN ADULT TO DRIVE YOU HOME AND BE WITH YOU FOR 24 HOURS. YOU MAY GO HOME BY TAXI OR UBER OR ORTHERWISE, BUT AN ADULT MUST ACCOMPANY YOU HOME AND STAY WITH YOU FOR 24 HOURS.    Special Instructions: N/A              Please read over the following fact sheets you were given: _____________________________________________________________________             [facility] - Preparing for Surgery Before surgery, you can play an important role.  Because skin is not sterile, your skin needs to be as free of germs as possible.  You can reduce the number of germs on your skin by washing with CHG (chlorahexidine gluconate) soap before surgery.  CHG is an antiseptic cleaner which kills germs and bonds with the skin to continue killing germs even after washing. Please DO NOT use if you  have an allergy to CHG or antibacterial soaps.  If your skin becomes reddened/irritated stop using the CHG and inform your nurse when you arrive at Short Stay. Do not shave (including legs and underarms) for at least 48 hours prior to the first CHG shower.  You may shave your face/neck. Please follow these instructions carefully:  1.  Shower with CHG Soap the night before surgery and the  morning of Surgery.  2.  If you choose to wash your hair, wash your hair first as usual with your  normal  shampoo.  3.  After you shampoo, rinse your hair and body thoroughly to remove the  shampoo.                           4.  Use CHG as you would any other liquid soap.  You can apply chg directly  to the skin and wash                       Gently with a scrungie or clean washcloth.  5.  Apply the CHG Soap to your body ONLY FROM THE NECK DOWN.   Do not use on face/ open                           Wound or open sores. Avoid contact with eyes, ears mouth and genitals (private parts).                       Wash face,  Genitals (private parts) with your normal soap.             6.  Wash thoroughly, paying special attention to the area where your surgery  will be performed.  7.  Thoroughly rinse your body with warm water from the neck down.  8.  DO NOT shower/wash with your normal soap after using and rinsing off  the CHG Soap.                9.  Pat yourself dry with a clean towel.            10.  Wear clean pajamas.            11.  Place clean sheets on your bed the night of your first shower and do not  sleep with pets. Day of Surgery : Do not apply any lotions/deodorants the morning of surgery.  Please wear clean clothes to the hospital/surgery center.  FAILURE TO FOLLOW THESE INSTRUCTIONS MAY RESULT IN THE CANCELLATION OF YOUR SURGERY PATIENT SIGNATURE_________________________________  NURSE  SIGNATURE__________________________________  ________________________________________________________________________   Adam Phenix  An incentive spirometer is a tool that can help keep your lungs clear and active. This tool measures how well you are filling your lungs with each breath. Taking long deep breaths may help reverse or decrease the chance of developing breathing (pulmonary) problems (especially infection) following:  A long period of time when you are unable to move or be active. BEFORE THE PROCEDURE   If the spirometer includes an indicator to show your best effort, your nurse or respiratory therapist will set it to a desired goal.  If possible, sit up straight or lean slightly forward. Try not to slouch.  Hold the incentive spirometer in an upright position. INSTRUCTIONS FOR USE  1. Sit on the edge of your bed if possible, or sit up as far as you can in bed or on a chair. 2. Hold the incentive spirometer in an upright position. 3. Breathe out normally. 4. Place the mouthpiece in your mouth and seal your lips tightly around it. 5. Breathe in slowly and as deeply as possible, raising the piston or the ball toward the top of the column. 6. Hold your breath for 3-5 seconds or for as long as possible. Allow the piston or ball to fall to the bottom of the column. 7. Remove the mouthpiece from your mouth and breathe out normally. 8. Rest for a few seconds and repeat Steps 1 through 7 at least 10 times every 1-2 hours when you are awake. Take your time and take a few normal breaths between deep breaths. 9. The spirometer may include an indicator to show your best effort. Use the indicator as a goal to work toward  during each repetition. 10. After each set of 10 deep breaths, practice coughing to be sure your lungs are clear. If you have an incision (the cut made at the time of surgery), support your incision when coughing by placing a pillow or rolled up towels firmly  against it. Once you are able to get out of bed, walk around indoors and cough well. You may stop using the incentive spirometer when instructed by your caregiver.  RISKS AND COMPLICATIONS  Take your time so you do not get dizzy or light-headed.  If you are in pain, you may need to take or ask for pain medication before doing incentive spirometry. It is harder to take a deep breath if you are having pain. AFTER USE  Rest and breathe slowly and easily.  It can be helpful to keep track of a log of your progress. Your caregiver can provide you with a simple table to help with this. If you are using the spirometer at home, follow these instructions: [place] IF:   You are having difficultly using the spirometer.  You have trouble using the spirometer as often as instructed.  Your pain medication is not giving enough relief while using the spirometer.  You develop fever of 100.5 F (38.1 C) or higher. SEEK IMMEDIATE MEDICAL CARE IF:   You cough up bloody sputum that had not been present before.  You develop fever of 102 F (38.9 C) or greater.  You develop worsening pain at or near the incision site. MAKE SURE YOU:   Understand these instructions.  Will watch your condition.  Will get help right away if you are not doing well or get worse. Document Released: 05/01/2006 Document Revised: 03/13/2011 Document Reviewed: 07/02/2006 Exit[employer] Patient Information 2014 ExitCare, [place].   ________________________________________________________________________  WHAT IS A BLOOD TRANSFUSION? Blood Transfusion Information  A transfusion is the replacement of blood or some of its parts. Blood is made up of multiple cells which provide different functions.  Red blood cells carry oxygen and are used for blood loss replacement.  White blood cells fight against infection.  Platelets control bleeding.  Plasma helps clot blood.  Other blood products are available for  specialized needs, such as hemophilia or other clotting disorders. BEFORE THE TRANSFUSION  Who gives blood for transfusions?   Healthy volunteers who are fully evaluated to make sure their blood is safe. This is blood bank blood. Transfusion therapy is the safest it has ever been in the practice of medicine. Before blood is taken from a donor, a complete history is taken to make sure that person has no history of diseases nor engages in risky social behavior (examples are intravenous drug use or sexual activity with multiple partners). The donor's travel history is screened to minimize risk of transmitting infections, such as malaria. The donated blood is tested for signs of infectious diseases, such as HIV and hepatitis. The blood is then tested to be sure it is compatible with you in order to minimize the chance of a transfusion reaction. If you or a relative donates blood, this is often done in anticipation of surgery and is not appropriate for emergency situations. It takes many days to process the donated blood. RISKS AND COMPLICATIONS Although transfusion therapy is very safe and saves many lives, the main dangers of transfusion include:   Getting an infectious disease.  Developing a transfusion reaction. This is an allergic reaction to something in the blood you were given. Every precaution is taken to  prevent this. The decision to have a blood transfusion has been considered carefully by your caregiver before blood is given. Blood is not given unless the benefits outweigh the risks. AFTER THE TRANSFUSION  Right after receiving a blood transfusion, you will usually feel much better and more energetic. This is especially true if your red blood cells have gotten low (anemic). The transfusion raises the level of the red blood cells which carry oxygen, and this usually causes an energy increase.  The nurse administering the transfusion will monitor you carefully for complications. HOME CARE  INSTRUCTIONS  No special instructions are needed after a transfusion. You may find your energy is better. Speak with your caregiver about any limitations on activity for underlying diseases you may have. SEEK MEDICAL CARE IF:   Your condition is not improving after your transfusion.  You develop redness or irritation at the intravenous (IV) site. SEEK IMMEDIATE MEDICAL CARE IF:  Any of the following symptoms occur over the next 12 hours:  Shaking chills.  You have a temperature by mouth above 102 F (38.9 C), not controlled by medicine.  Chest, back, or muscle pain.  People around you feel you are not acting correctly or are confused.  Shortness of breath or difficulty breathing.  Dizziness and fainting.  You get a rash or develop hives.  You have a decrease in urine output.  Your urine turns a dark color or changes to pink, red, or brown. Any of the following symptoms occur over the next 10 days:  You have a temperature by mouth above 102 F (38.9 C), not controlled by medicine.  Shortness of breath.  Weakness after normal activity.  The white part of the eye turns yellow (jaundice).  You have a decrease in the amount of urine or are urinating less often.  Your urine turns a dark color or changes to pink, red, or brown. Document Released: 12/17/1999 Document Revised: 03/13/2011 Document Reviewed: 08/05/2007 [facility] Patient Information 2014 [place], [place].  _______________________________________________________________________

## 2018-03-22 NOTE — Anesthesia Preprocedure Evaluation (Deleted)
Anesthesia Evaluation  General Assessment Comment:Complication of anesthesia  ketamine hallucinations   History of Anesthesia Complications (+) history of anesthetic complications  Airway        Dental   Pulmonary former smoker,           Cardiovascular hypertension,      Neuro/Psych    GI/Hepatic   Endo/Other    Renal/GU      Musculoskeletal   Abdominal   Peds  Hematology   Anesthesia Other Findings   Reproductive/Obstetrics                                                             Anesthesia Evaluation  Patient identified by MRN, date of birth, ID band Patient awake    Reviewed: Allergy & Precautions, NPO status , Patient's Chart, lab work & pertinent test results  Airway Mallampati: II  TM Distance: >3 FB Neck ROM: Full    Dental  (+) Teeth Intact, Dental Advisory Given   Pulmonary former smoker,    breath sounds clear to auscultation       Cardiovascular hypertension,  Rhythm:Regular Rate:Normal     Neuro/Psych    GI/Hepatic   Endo/Other    Renal/GU      Musculoskeletal   Abdominal (+) + obese,   Peds  Hematology   Anesthesia Other Findings   Reproductive/Obstetrics                             Anesthesia Physical Anesthesia Plan  ASA: III  Anesthesia Plan: General   Post-op Pain Management:    Induction: Intravenous  PONV Risk Score and Plan: Ondansetron and Dexamethasone  Airway Management Planned: Oral ETT  Additional Equipment:   Intra-op Plan:   Post-operative Plan: Possible Post-op intubation/ventilation  Informed Consent: I have reviewed the patients History and Physical, chart, labs and discussed the procedure including the risks, benefits and alternatives for the proposed anesthesia with the patient or authorized representative who has indicated his/her understanding and acceptance.   Dental  advisory given  Plan Discussed with: CRNA and Anesthesiologist  Anesthesia Plan Comments:         Anesthesia Quick Evaluation  Anesthesia Physical Anesthesia Plan  ASA:   Anesthesia Plan:    Post-op Pain Management:    Induction:   PONV Risk Score and Plan:   Airway Management Planned:   Additional Equipment:   Intra-op Plan:   Post-operative Plan:   Informed Consent:   Plan Discussed with:   Anesthesia Plan Comments: (See PAT note 03/25/18, Konrad Felix, PA-C)       Anesthesia Quick Evaluation

## 2018-03-22 NOTE — Progress Notes (Signed)
Anesthesia Chart Review   Case:  [id number] Date/Time:  03/27/18 0715   Procedure:  TOTAL HIP ARTHROPLASTY ANTERIOR APPROACH- DA complex (Left )   Anesthesia type:  Spinal   Pre-op diagnosis:  Avascular necrosis left hip with collapse   Location:  [facility] ROOM 05 / [facility]   Surgeon:  Rod Can, MD      DISCUSSION: 58 yo former smoker (25 pack years, quit 01/02/98) with h/o HTN, GERD, asthma, recurrent lower extremity DVT (on Eliquis, followed by vascular surgery), avascular necrosis left hip with collapse scheduled for above procedure 03/27/18 with Dr. Rod Can.   Pt seen by Dr. Sherren Mocha Early with vascular and vein specialists due to DVT.  Pt to continue on oral anticoagulation and follow up as needed.    Evaluated by hem/onc, Dr. Tish Men 02/12/18, recurrence of DVT unexplained.  Pt to continue with lifelong Eliquis with reduction in dose in the future.   Per PCP, Dr. Aundria Mems, last OV note 02/14/18, "He has greater than 4 metabolic equivalents of cardiac tolerance, he has done well in the past with right hip arthroplasty, bilateral knee arthroplasties and extensive lumbosacral fusion.  He is on Eliquis for a DVT, he will stop the Eliquis 2 days before his surgery and restart postop day 1."  Per phone conversation 03/21/18 Dr. Dianah Field states, "He has essentially no femoral head and I do think that this procedure is NOT elective, it is urgently necessary."  Order for IVC filter in Epic with a note that states unable to schedule due to patient not returning phone messages/calling.  Called Dr. Sid Falcon scheduler to determine if IVC filter needs to be placed prior to proceeding with THA.  Unsure who ordered IVC filter.   VS: There were no vitals taken for this visit.  PROVIDERS: Silverio Decamp, MD is PCP   Curt Jews, MD with Vascular Surgery  Tish Men, MD with Oncology  LABS: Labs DOS, same day workup (all labs ordered are listed, but only abnormal results are  displayed)  Labs Reviewed - No data to display   IMAGES: 01/08/2018 CT HIP LEFT IMPRESSION: 1. Severe fragmentation osteolysis of the left femoral head with multiple bony fragments in the joint space. Large left hip joint effusion. This represents severe progressive change compared with most recent x-ray dated 07/24/2017. Differential considerations include septic arthritis versus rapidly progressive osteoarthritis.  Vascular US Lower Extremity 01/01/18 Summary: Right: Findings consistent with age indeterminate deep vein thrombosis involving the right gastrocnemius. Findings consistent with age indeterminate superficial vein thrombosis involving the right small saphenous vein. Calf veins suboptimally visualized. Left: Findings consistent with age indeterminate superficial vein thrombosis involving the left small sahenous vein. Calf veins suboptimally visualized.  EKG: 06/09/2016  Rate 58 bpm Sinus bradycardia  Low voltage QRS Cannot rule out inferior infarct, age undetermined Abnormal ECG RSR prime No significant change since last tracing  CV: Echo 05/23/12 Study Conclusions  Left ventricle: The cavity size was normal. There was mild concentric hypertrophy. Systolic function was normal. The estimated ejection fraction was in the range of 60% to 65%. Wall motion was normal; there were no regional wall motion abnormalities. Features are consistent with a pseudonormal left ventricular filling pattern, with concomitant abnormal relaxation and increased filling pressure (grade 2 diastolic dysfunction).       Past Medical History:  Diagnosis Date  . Arthritis   . Asthma   . Back pain   . GERD (gastroesophageal reflux disease)    uses tums  .  Hypertension   . Viral pericarditis     Past Surgical History:  Procedure Laterality Date  . ABDOMINAL EXPOSURE N/A 06/23/2016   Procedure: ABDOMINAL EXPOSURE;  Surgeon: Rosetta Posner, MD;  Location: [place];  Service: Vascular;   Laterality: N/A;  . ANTERIOR LATERAL LUMBAR FUSION 4 LEVELS Right 06/23/2016   Procedure: Right  Lumbar two-three Lumbar three-four Lumbar four-five Anterolateral lumbar interbody fusion;  Surgeon: Erline Levine, MD;  Location: [place];  Service: Neurosurgery;  Laterality: Right;  . ANTERIOR LUMBAR FUSION N/A 06/23/2016   Procedure: Lumbar five -sacral one  Anterior lumbar interbody fusion with Dr. Sherren Mocha Early for approach;  Surgeon: Erline Levine, MD;  Location: [place];  Service: Neurosurgery;  Laterality: N/A;  . APPLICATION OF INTRAOPERATIVE CT SCAN N/A 06/26/2016   Procedure: APPLICATION OF INTRAOPERATIVE CT SCAN;  Surgeon: Erline Levine, MD;  Location: [place];  Service: Neurosurgery;  Laterality: N/A;  . CARDIOVASCULAR STRESS TEST     Negative in May 2014  . COLONOSCOPY    . HERNIA REPAIR    . JOINT REPLACEMENT    . KNEE SURGERY    . POSTERIOR LUMBAR FUSION 4 LEVEL N/A 06/26/2016   Procedure: Thoracic ten to Pelvis fixation with AIRO;  Surgeon: Erline Levine, MD;  Location: [place];  Service: Neurosurgery;  Laterality: N/A;  . TENDON REPAIR     right hand  . TOTAL HIP ARTHROPLASTY Right 05/25/2014   Procedure: RIGHT TOTAL HIP ARTHROPLASTY ANTERIOR APPROACH;  Surgeon: Rod Can, MD;  Location: [place];  Service: Orthopedics;  Laterality: Right;  . TOTAL KNEE ARTHROPLASTY Bilateral     MEDICATIONS: No current facility-administered medications for this encounter.    [name] apixaban (ELIQUIS) 5 MG TABS tablet  . atenolol (TENORMIN) 100 MG tablet  . calcium carbonate (TUMS - DOSED IN MG ELEMENTAL CALCIUM) 500 MG chewable tablet  . cyclobenzaprine (FLEXERIL) 10 MG tablet  . diazepam (VALIUM) 5 MG tablet  . DULoxetine (CYMBALTA) 60 MG capsule  . HYDROmorphone (DILAUDID) 4 MG tablet  . lisinopril-hydrochlorothiazide (PRINZIDE,ZESTORETIC) 20-25 MG tablet  . OXYCONTIN 20 MG 12 hr tablet  . sildenafil (VIAGRA) 100 MG tablet  . terbinafine (LAMISIL) 250 MG tablet  . AMBULATORY NON FORMULARY  MEDICATION  . clobetasol ointment (TEMOVATE) 0.05 %  . clotrimazole-betamethasone (LOTRISONE) cream    Maia Plan [facility] Pre-Surgical Testing [phone] 03/22/18 11:39 AM

## 2018-03-25 ENCOUNTER — Telehealth: Payer: Self-pay | Admitting: Student

## 2018-03-25 ENCOUNTER — Other Ambulatory Visit: Payer: Self-pay

## 2018-03-25 ENCOUNTER — Other Ambulatory Visit: Payer: Self-pay | Admitting: Student

## 2018-03-25 ENCOUNTER — Encounter (INDEPENDENT_AMBULATORY_CARE_PROVIDER_SITE_OTHER): Payer: Self-pay

## 2018-03-25 ENCOUNTER — Encounter (HOSPITAL_COMMUNITY): Payer: Self-pay

## 2018-03-25 ENCOUNTER — Encounter (HOSPITAL_COMMUNITY)
Admission: RE | Admit: 2018-03-25 | Discharge: 2018-03-25 | Disposition: A | Discharge status: Home / Self Care | Payer: 59 | Source: Ambulatory Visit | Attending: Orthopedic Surgery | Admitting: Orthopedic Surgery

## 2018-03-25 DIAGNOSIS — M87052 Idiopathic aseptic necrosis of left femur: Secondary | ICD-10-CM | POA: Insufficient documentation

## 2018-03-25 DIAGNOSIS — I10 Essential (primary) hypertension: Secondary | ICD-10-CM

## 2018-03-25 DIAGNOSIS — R9431 Abnormal electrocardiogram [ECG] [EKG]: Secondary | ICD-10-CM

## 2018-03-25 DIAGNOSIS — M25752 Osteophyte, left hip: Secondary | ICD-10-CM | POA: Diagnosis not present

## 2018-03-25 DIAGNOSIS — I444 Left anterior fascicular block: Secondary | ICD-10-CM

## 2018-03-25 DIAGNOSIS — Z01818 Encounter for other preprocedural examination: Secondary | ICD-10-CM | POA: Insufficient documentation

## 2018-03-25 DIAGNOSIS — I4510 Unspecified right bundle-branch block: Secondary | ICD-10-CM

## 2018-03-25 DIAGNOSIS — Z4589 Encounter for adjustment and management of other implanted devices: Secondary | ICD-10-CM | POA: Diagnosis not present

## 2018-03-25 DIAGNOSIS — I2699 Other pulmonary embolism without acute cor pulmonale: Secondary | ICD-10-CM | POA: Diagnosis not present

## 2018-03-25 DIAGNOSIS — Z96641 Presence of right artificial hip joint: Secondary | ICD-10-CM | POA: Diagnosis not present

## 2018-03-25 DIAGNOSIS — M1612 Unilateral primary osteoarthritis, left hip: Secondary | ICD-10-CM | POA: Diagnosis not present

## 2018-03-25 DIAGNOSIS — R001 Bradycardia, unspecified: Secondary | ICD-10-CM | POA: Insufficient documentation

## 2018-03-25 DIAGNOSIS — M87852 Other osteonecrosis, left femur: Secondary | ICD-10-CM | POA: Diagnosis not present

## 2018-03-25 DIAGNOSIS — Z6841 Body Mass Index (BMI) 40.0 and over, adult: Secondary | ICD-10-CM | POA: Diagnosis not present

## 2018-03-25 DIAGNOSIS — J45909 Unspecified asthma, uncomplicated: Secondary | ICD-10-CM | POA: Diagnosis not present

## 2018-03-25 DIAGNOSIS — I739 Peripheral vascular disease, unspecified: Secondary | ICD-10-CM | POA: Diagnosis not present

## 2018-03-25 DIAGNOSIS — I452 Bifascicular block: Secondary | ICD-10-CM | POA: Diagnosis not present

## 2018-03-25 DIAGNOSIS — K219 Gastro-esophageal reflux disease without esophagitis: Secondary | ICD-10-CM | POA: Diagnosis not present

## 2018-03-25 DIAGNOSIS — I82813 Embolism and thrombosis of superficial veins of lower extremities, bilateral: Secondary | ICD-10-CM | POA: Diagnosis not present

## 2018-03-25 HISTORY — DX: Idiopathic aseptic necrosis of left femur: M87.052

## 2018-03-25 HISTORY — DX: Adverse effect of unspecified anesthetic, initial encounter: T41.45XA

## 2018-03-25 HISTORY — DX: Other complications of anesthesia, initial encounter: T88.59XA

## 2018-03-25 HISTORY — DX: Acute embolism and thrombosis of unspecified vein: I82.90

## 2018-03-25 LAB — CBC
HCT: 46.1 % (ref 39.0–52.0)
Hemoglobin: 14.9 g/dL (ref 13.0–17.0)
MCH: 31.4 pg (ref 26.0–34.0)
MCHC: 32.3 g/dL (ref 30.0–36.0)
MCV: 97.3 fL (ref 80.0–100.0)
Platelets: 242 10*3/uL (ref 150–400)
RBC: 4.74 MIL/uL (ref 4.22–5.81)
RDW: 13.1 % (ref 11.5–15.5)
WBC: 7.9 10*3/uL (ref 4.0–10.5)
nRBC: 0 % (ref 0.0–0.2)

## 2018-03-25 LAB — BASIC METABOLIC PANEL
Anion gap: 10 (ref 5–15)
BUN: 16 mg/dL (ref 6–20)
CO2: 28 mmol/L (ref 22–32)
Calcium: 9.5 mg/dL (ref 8.9–10.3)
Chloride: 102 mmol/L (ref 98–111)
Creatinine, Ser: 0.86 mg/dL (ref 0.61–1.24)
GFR calc Af Amer: >60 mL/min (ref >60)
GFR calc non Af Amer: >60 mL/min (ref >60)
Glucose, Bld: 118 mg/dL — ABNORMAL HIGH (ref 70–99)
Potassium: 3.9 mmol/L (ref 3.5–5.1)
Sodium: 140 mmol/L (ref 135–145)

## 2018-03-25 LAB — SURGICAL PCR SCREEN
MRSA, PCR: NEGATIVE
Staphylococcus aureus: NEGATIVE

## 2018-03-25 NOTE — Progress Notes (Signed)
Anesthesia Chart Review   Case:  [id number] Date/Time:  03/27/18 0715   Procedure:  TOTAL HIP ARTHROPLASTY ANTERIOR APPROACH- DA complex (Left )   Anesthesia type:  Spinal   Pre-op diagnosis:  Avascular necrosis left hip with collapse   Location:  [facility] ROOM 05 / [facility]   Surgeon:  Rod Can, MD      DISCUSSION:58 yo former smoker (25 pack years, quit 01/02/98) with h/o HTN, GERD, asthma, recurrent lower extremity DVT (on Eliquis, followed by vascular surgery), avascular necrosis left hip with collapse scheduled for above procedure 03/27/18 with Dr. Rod Can.   Pt seen by Dr. Sherren Mocha Early with vascular and vein specialists due to DVT.  Pt to continue on oral anticoagulation and follow up as needed.    Evaluated by hem/onc, Dr. Tish Men 02/12/18, recurrence of DVT unexplained.  Pt to continue with lifelong Eliquis with reduction in dose in the future.   Per PCP, Dr. Aundria Mems, last OV note 02/14/18, "He has greater than 4 metabolic equivalents of cardiac tolerance, he has done well in the past with right hip arthroplasty, bilateral knee arthroplasties and extensive lumbosacral fusion.  He is on Eliquis for a DVT, he will stop the Eliquis 2 days before his surgery and restart postop day 1."  Per phone conversation 03/21/18 Dr. Dianah Field states, "He has essentially no femoral head and I do think that this procedure isNOTelective, it is urgentlynecessary."  Order for IVC filter in Epic with a note that states unable to schedule due to patient not returning phone messages/calling.  Called Dr. Sid Falcon scheduler to determine if IVC filter needs to be placed prior to proceeding with THA.  Unsure who ordered IVC filter.   Addendum 03/25/18:  Discussed IVC filter with Dr. Sid Falcon scheduler.  He is now scheduled to have IVC filter placed tomorrow.    EKG at PAT visit 02/24/18 which shows incomplete right bundle branch block, left anterior fascicular block with is unchanged  since last tracing.  Pt asymptomatic.  Pt's wife is concerned regarding the findings and states she wants to have a friend review before proceeding with surgery. Discussed with Dr. Sid Falcon scheduler.    Pt ok to proceed with planned procedure barring acute status change.  VS: BP (!) 157/96 (BP Location: Left Arm)   Pulse 66   Temp 36.8 C (Oral)   Resp 18   Ht 6' (1.829 m)   SpO2 95%   BMI 40.16 kg/m   PROVIDERS: Silverio Decamp, MD is PCP    LABS: Labs reviewed: Acceptable for surgery. (all labs ordered are listed, but only abnormal results are displayed)  Labs Reviewed  BASIC METABOLIC PANEL - Abnormal; Notable for the following components:      Result Value   Glucose, Bld 118 (*)    All other components within normal limits  SURGICAL PCR SCREEN  CBC  TYPE AND SCREEN     IMAGES: CT Hip Left 01/08/2018 IMPRESSION: 1. Severe fragmentation osteolysis of the left femoral head with multiple bony fragments in the joint space. Large left hip joint effusion. This represents severe progressive change compared with most recent x-ray dated 07/24/2017. Differential considerations include septic arthritis versus rapidly progressive osteoarthritis.  EKG: 03/25/18 Rate 58 bpm Sinus bradycardia Low voltage QRS Incomplete right bundle branch block Left anterior fascicular block  Abnormal ECG No significant change since last tracing  CV: Echo 05/23/2012 Study Conclusions  Left ventricle: The cavity size was normal. There was mild concentric hypertrophy. Systolic  function was normal. The estimated ejection fraction was in the range of 60% to 65%. Wall motion was normal; there were no regional wall motion abnormalities. Features are consistent with a pseudonormal left ventricular filling pattern, with concomitant abnormal relaxation and increased filling pressure (grade 2 diastolic dysfunction).  Past Medical History:  Diagnosis Date  . Arthritis   . Asthma     as child  . Avascular necrosis of bone of left hip ([place])   . Back pain   . Blood clot in vein 06/2016   x 2 no bleeding disorders found  . Complication of anesthesia    ketamine hallucinations  . GERD (gastroesophageal reflux disease)   . Hypertension   . Viral pericarditis 20 yrs ago    Past Surgical History:  Procedure Laterality Date  . ABDOMINAL EXPOSURE N/A 06/23/2016   Procedure: ABDOMINAL EXPOSURE;  Surgeon: Rosetta Posner, MD;  Location: [place];  Service: Vascular;  Laterality: N/A;  . ANTERIOR LATERAL LUMBAR FUSION 4 LEVELS Right 06/23/2016   Procedure: Right  Lumbar two-three Lumbar three-four Lumbar four-five Anterolateral lumbar interbody fusion;  Surgeon: Erline Levine, MD;  Location: [place];  Service: Neurosurgery;  Laterality: Right;  . ANTERIOR LUMBAR FUSION N/A 06/23/2016   Procedure: Lumbar five -sacral one  Anterior lumbar interbody fusion with Dr. Sherren Mocha Early for approach;  Surgeon: Erline Levine, MD;  Location: [place];  Service: Neurosurgery;  Laterality: N/A;  . APPLICATION OF INTRAOPERATIVE CT SCAN N/A 06/26/2016   Procedure: APPLICATION OF INTRAOPERATIVE CT SCAN;  Surgeon: Erline Levine, MD;  Location: [place];  Service: Neurosurgery;  Laterality: N/A;  . CARDIOVASCULAR STRESS TEST     Negative in May 2014  . COLONOSCOPY    . HERNIA REPAIR     umbilical  . JOINT REPLACEMENT    . POSTERIOR LUMBAR FUSION 4 LEVEL N/A 06/26/2016   Procedure: Thoracic ten to Pelvis fixation with AIRO;  Surgeon: Erline Levine, MD;  Location: [place];  Service: Neurosurgery;  Laterality: N/A;  . TENDON REPAIR     right hand  . TOTAL HIP ARTHROPLASTY Right 05/25/2014   Procedure: RIGHT TOTAL HIP ARTHROPLASTY ANTERIOR APPROACH;  Surgeon: Rod Can, MD;  Location: [place];  Service: Orthopedics;  Laterality: Right;  . TOTAL KNEE ARTHROPLASTY Bilateral     MEDICATIONS: . apixaban (ELIQUIS) 5 MG TABS tablet  . AMBULATORY NON FORMULARY MEDICATION  . atenolol (TENORMIN) 100 MG tablet  . calcium  carbonate (TUMS - DOSED IN MG ELEMENTAL CALCIUM) 500 MG chewable tablet  . clobetasol ointment (TEMOVATE) 0.05 %  . clotrimazole-betamethasone (LOTRISONE) cream  . cyclobenzaprine (FLEXERIL) 10 MG tablet  . diazepam (VALIUM) 5 MG tablet  . DULoxetine (CYMBALTA) 60 MG capsule  . HYDROmorphone (DILAUDID) 4 MG tablet  . lisinopril-hydrochlorothiazide (PRINZIDE,ZESTORETIC) 20-25 MG tablet  . OXYCONTIN 20 MG 12 hr tablet  . sildenafil (VIAGRA) 100 MG tablet  . terbinafine (LAMISIL) 250 MG tablet   No current facility-administered medications for this encounter.     Maia Plan [facility] Pre-Surgical Testing [phone] 03/25/18 2:31 PM

## 2018-03-25 NOTE — Progress Notes (Signed)
Spoke with patient and made aware anesthesia pa jessica zanetto ok with ekg from 03-25-2018 for 03-27-2018 surgery. No cardiac clearance needed.

## 2018-03-25 NOTE — Progress Notes (Signed)
ERROR

## 2018-03-25 NOTE — Progress Notes (Signed)
Medical clearance note dr Rosalio Loud 02-14-2018 epic

## 2018-03-26 ENCOUNTER — Encounter (HOSPITAL_COMMUNITY): Payer: Self-pay

## 2018-03-26 ENCOUNTER — Ambulatory Visit: Payer: Self-pay | Admitting: Orthopedic Surgery

## 2018-03-26 ENCOUNTER — Ambulatory Visit (HOSPITAL_COMMUNITY)
Admission: RE | Admit: 2018-03-26 | Discharge: 2018-03-26 | Disposition: A | Discharge status: Home / Self Care | Payer: 59 | Source: Ambulatory Visit | Attending: Orthopedic Surgery | Admitting: Orthopedic Surgery

## 2018-03-26 DIAGNOSIS — I10 Essential (primary) hypertension: Secondary | ICD-10-CM

## 2018-03-26 DIAGNOSIS — I2699 Other pulmonary embolism without acute cor pulmonale: Secondary | ICD-10-CM | POA: Diagnosis not present

## 2018-03-26 DIAGNOSIS — Z7901 Long term (current) use of anticoagulants: Secondary | ICD-10-CM | POA: Insufficient documentation

## 2018-03-26 DIAGNOSIS — I82812 Embolism and thrombosis of superficial veins of left lower extremities: Secondary | ICD-10-CM | POA: Insufficient documentation

## 2018-03-26 DIAGNOSIS — I739 Peripheral vascular disease, unspecified: Secondary | ICD-10-CM | POA: Diagnosis not present

## 2018-03-26 DIAGNOSIS — Z79899 Other long term (current) drug therapy: Secondary | ICD-10-CM | POA: Insufficient documentation

## 2018-03-26 DIAGNOSIS — M8788 Other osteonecrosis, other site: Secondary | ICD-10-CM | POA: Insufficient documentation

## 2018-03-26 DIAGNOSIS — I82461 Acute embolism and thrombosis of right calf muscular vein: Secondary | ICD-10-CM | POA: Insufficient documentation

## 2018-03-26 DIAGNOSIS — K219 Gastro-esophageal reflux disease without esophagitis: Secondary | ICD-10-CM | POA: Insufficient documentation

## 2018-03-26 DIAGNOSIS — I82813 Embolism and thrombosis of superficial veins of lower extremities, bilateral: Secondary | ICD-10-CM | POA: Diagnosis not present

## 2018-03-26 DIAGNOSIS — M25552 Pain in left hip: Secondary | ICD-10-CM

## 2018-03-26 DIAGNOSIS — Z87891 Personal history of nicotine dependence: Secondary | ICD-10-CM

## 2018-03-26 DIAGNOSIS — Z4589 Encounter for adjustment and management of other implanted devices: Secondary | ICD-10-CM | POA: Diagnosis not present

## 2018-03-26 HISTORY — PX: IR IVC FILTER PLMT / S&I /IMG GUID/MOD SED: IMG701

## 2018-03-26 LAB — PROTIME-INR
INR: 1 (ref 0.8–1.2)
Prothrombin Time: 12.9 seconds (ref 11.4–15.2)

## 2018-03-26 LAB — APTT: aPTT: 33 seconds (ref 24–36)

## 2018-03-26 MED ORDER — FENTANYL CITRATE (PF) 100 MCG/2ML IJ SOLN
INTRAMUSCULAR | Status: AC
  Filled 2018-03-26: qty 2

## 2018-03-26 MED ORDER — IOHEXOL 300 MG/ML  SOLN
100.0000 mL | Freq: Once | INTRAMUSCULAR | Status: AC | PRN
  Administered 2018-03-26: 55 mL via INTRAVENOUS

## 2018-03-26 MED ORDER — LIDOCAINE HCL 1 % IJ SOLN
INTRAMUSCULAR | Status: AC | PRN
  Administered 2018-03-26: 10 mL

## 2018-03-26 MED ORDER — SODIUM CHLORIDE 0.9 % IV SOLN
INTRAVENOUS | Status: AC | PRN
  Administered 2018-03-26: 10 mL/h via INTRAVENOUS

## 2018-03-26 MED ORDER — MIDAZOLAM HCL 2 MG/2ML IJ SOLN
INTRAMUSCULAR | Status: AC
  Filled 2018-03-26: qty 2

## 2018-03-26 MED ORDER — MIDAZOLAM HCL 2 MG/2ML IJ SOLN
INTRAMUSCULAR | Status: AC | PRN
  Administered 2018-03-26: 1 mg via INTRAVENOUS
  Administered 2018-03-26 (×2): 0.5 mg via INTRAVENOUS

## 2018-03-26 MED ORDER — SODIUM CHLORIDE 0.9 % IV SOLN: INTRAVENOUS | Status: DC

## 2018-03-26 MED ORDER — LIDOCAINE HCL 1 % IJ SOLN
INTRAMUSCULAR | Status: AC
  Filled 2018-03-26: qty 20

## 2018-03-26 MED ORDER — DEXTROSE 5 % IV SOLN
3.0000 g | INTRAVENOUS | Status: AC
  Administered 2018-03-27: 3 g via INTRAVENOUS
  Filled 2018-03-26: qty 3

## 2018-03-26 MED ORDER — FENTANYL CITRATE (PF) 100 MCG/2ML IJ SOLN
INTRAMUSCULAR | Status: AC | PRN
  Administered 2018-03-26: 50 ug via INTRAVENOUS
  Administered 2018-03-26 (×2): 25 ug via INTRAVENOUS

## 2018-03-26 NOTE — Procedures (Signed)
Pre procedural Dx: History of Pulmonary Embolism, impending hip surgery Post Procedural Dx: Same  Successful placement of an infrarenal IVC filter via the right IJ vein.  EBL: Trace  Complications: None immediate  Ronny Bacon, MD Pager #: [phone]

## 2018-03-26 NOTE — H&P (Signed)
Chief Complaint: Patient was seen in consultation today for retrievable inferior vena cava filter placement at the request of [place]  Referring Physician(s): Swinteck,Brian  Supervising Physician: Sandi Mariscal  Patient Status: [facility] - Out-pt  History of Present Illness: Travis Huff is a 58 y.o. male   Known Hx DVT since 2018 Was placed on anticoagulant then off DVT did recur 11/2017 Again 01/01/18 new pain and swelling in Low extremities  12/2017:  Summary: Right: Findings consistent with age indeterminate deep vein thrombosis involving the right gastrocnemius. Findings consistent with age indeterminate superficial vein thrombosis involving the right small saphenous vein. Calf veins suboptimally visualized. Left: Findings consistent with age indeterminate superficial vein thrombosis involving the left small sahenous vein. Calf veins suboptimally visualized.  Now on life long anticoagulation On Eliquis daily LD 2 days ago  Scheduled for left hip surgery with Dr Lyla Glassing tomorrow Avascular necrosis  Will resume Eliquis day after surgery per notes  Past Medical History:  Diagnosis Date  . Arthritis   . Asthma    as child  . Avascular necrosis of bone of left hip ([place])   . Back pain   . Blood clot in vein 06/2016   x 2 no bleeding disorders found  . Complication of anesthesia    ketamine hallucinations  . GERD (gastroesophageal reflux disease)   . Hypertension   . Viral pericarditis 20 yrs ago    Past Surgical History:  Procedure Laterality Date  . ABDOMINAL EXPOSURE N/A 06/23/2016   Procedure: ABDOMINAL EXPOSURE;  Surgeon: Rosetta Posner, MD;  Location: [place];  Service: Vascular;  Laterality: N/A;  . ANTERIOR LATERAL LUMBAR FUSION 4 LEVELS Right 06/23/2016   Procedure: Right  Lumbar two-three Lumbar three-four Lumbar four-five Anterolateral lumbar interbody fusion;  Surgeon: Erline Levine, MD;  Location: [place];  Service: Neurosurgery;  Laterality: Right;   . ANTERIOR LUMBAR FUSION N/A 06/23/2016   Procedure: Lumbar five -sacral one  Anterior lumbar interbody fusion with Dr. Sherren Mocha Early for approach;  Surgeon: Erline Levine, MD;  Location: [place];  Service: Neurosurgery;  Laterality: N/A;  . APPLICATION OF INTRAOPERATIVE CT SCAN N/A 06/26/2016   Procedure: APPLICATION OF INTRAOPERATIVE CT SCAN;  Surgeon: Erline Levine, MD;  Location: [place];  Service: Neurosurgery;  Laterality: N/A;  . CARDIOVASCULAR STRESS TEST     Negative in May 2014  . COLONOSCOPY    . HERNIA REPAIR     umbilical  . JOINT REPLACEMENT    . POSTERIOR LUMBAR FUSION 4 LEVEL N/A 06/26/2016   Procedure: Thoracic ten to Pelvis fixation with AIRO;  Surgeon: Erline Levine, MD;  Location: [place];  Service: Neurosurgery;  Laterality: N/A;  . TENDON REPAIR     right hand  . TOTAL HIP ARTHROPLASTY Right 05/25/2014   Procedure: RIGHT TOTAL HIP ARTHROPLASTY ANTERIOR APPROACH;  Surgeon: Rod Can, MD;  Location: [place];  Service: Orthopedics;  Laterality: Right;  . TOTAL KNEE ARTHROPLASTY Bilateral     Allergies: Ketamine and Morphine and related  Medications: Prior to Admission medications   Medication Sig Start Date End Date Taking? Authorizing Provider  AMBULATORY NON FORMULARY MEDICATION Knee-high, medium compression, graduated compression stockings. Apply to lower extremities. Medium circ, long length 12/04/17  Yes Silverio Decamp, MD  apixaban (ELIQUIS) 5 MG TABS tablet Take 1 tablet (5 mg total) by mouth 2 (two) times daily. 12/11/17  Yes Silverio Decamp, MD  atenolol (TENORMIN) 100 MG tablet Take 1 tablet (100 mg total) by mouth daily. 12/11/17  Yes Silverio Decamp, MD  calcium carbonate (TUMS - DOSED IN MG ELEMENTAL CALCIUM) 500 MG chewable tablet Chew 3-4 tablets by mouth 2 (two) times daily as needed (stomach cramps).    Yes [provider]  clobetasol ointment (TEMOVATE) 7.74 % Apply 1 application topically 2 (two) times daily. and cover with  cotton socks 12/04/17  Yes Silverio Decamp, MD  clotrimazole-betamethasone (LOTRISONE) cream Apply 1 application topically 2 (two) times daily. 02/14/18  Yes Silverio Decamp, MD  diazepam (VALIUM) 5 MG tablet Take 1 tablet (5 mg total) by mouth at bedtime as needed for muscle spasms. Patient taking differently: Take 5 mg by mouth at bedtime.  12/27/17  Yes Deno Etienne, DO  DULoxetine (CYMBALTA) 60 MG capsule TAKE 1 CAPSULE (60 MG TOTAL) BY MOUTH DAILY. Patient taking differently: Take 60 mg by mouth at bedtime.  11/20/17  Yes Silverio Decamp, MD  HYDROmorphone (DILAUDID) 4 MG tablet TAKE 1 TABLET BY MOUTH EVERY 6 HOURS AS NEEDED FOR SEVERE PAIN Patient taking differently: Take 4 mg by mouth every 6 (six) hours as needed (breakthrough pain.).  03/21/18  Yes Silverio Decamp, MD  lisinopril-hydrochlorothiazide (PRINZIDE,ZESTORETIC) 20-25 MG tablet TAKE 1 TABLET BY MOUTH DAILY. 07/30/17  Yes Silverio Decamp, MD  OXYCONTIN 20 MG 12 hr tablet TAKE 1 TABLET BY MOUTH EVERY 12 HOURS Patient taking differently: Take 20 mg by mouth every 12 (twelve) hours.  03/07/18  Yes Silverio Decamp, MD  sildenafil (VIAGRA) 100 MG tablet TAKE 1 TABLET BY MOUTH AS NEEDED FOR ERECTILE DYSFUNCTION *MAX DOSE IS 1 TABLET PER DAY* Patient taking differently: Take 100 mg by mouth daily as needed for erectile dysfunction.  04/18/17  Yes Silverio Decamp, MD  terbinafine (LAMISIL) 250 MG tablet Take 1 tablet (250 mg total) by mouth daily. 02/14/18 05/09/18 Yes Silverio Decamp, MD  cyclobenzaprine (FLEXERIL) 10 MG tablet Take 2 tablets (20 mg total) by mouth 2 (two) times daily. Patient not taking: Reported on 03/25/2018 01/07/18   Silverio Decamp, MD     Family History  Adopted: Yes  Problem Relation Age of Onset  . Healthy Daughter     Social History   Socioeconomic History  . Marital status: Married    Spouse name: Not on file  . Number of children: 2  . Years of  education: 57  . Highest education level: Not on file  Occupational History  . Occupation: Public relations account executive  Social Needs  . Financial resource strain: Not on file  . Food insecurity:    Worry: Not on file    Inability: Not on file  . Transportation needs:    Medical: Not on file    Non-medical: Not on file  Tobacco Use  . Smoking status: Former Smoker    Packs/day: 1.00    Years: 25.00    Pack years: 25.00    Types: Cigarettes    Last attempt to quit: 2000    Years since quitting: 20.2  . Smokeless tobacco: Current User    Types: Snuff  Substance and Sexual Activity  . Alcohol use: Yes    Alcohol/week: 14.0 standard drinks    Types: 14 Cans of beer per week    Comment: 2 beers QD  . Drug use: No  . Sexual activity: Not on file  Lifestyle  . Physical activity:    Days per week: Not on file    Minutes per session: Not on file  . Stress: Not on  file  Relationships  . Social connections:    Talks on phone: Not on file    Gets together: Not on file    Attends religious service: Not on file    Active member of club or organization: Not on file    Attends meetings of clubs or organizations: Not on file    Relationship status: Not on file  Other Topics Concern  . Not on file  Social History Narrative   Lives with wife in a one story home.  Has 2 children.  Works as a Public relations account executive.  Education: high school.     Review of Systems: A 12 point ROS discussed and pertinent positives are indicated in the HPI above.  All other systems are negative.  Review of Systems  Constitutional: Negative for activity change, fatigue and fever.  Respiratory: Negative for cough and shortness of breath.   Gastrointestinal: Negative for abdominal pain.  Musculoskeletal: Positive for gait problem.  Neurological: Negative for weakness.  Psychiatric/Behavioral: Negative for behavioral problems and confusion.    Vital Signs: BP (!) 130/91   Pulse (!) 50   Temp 98 F (36.7 C) (Oral)   Resp  18   Ht 6' (1.829 m)   Wt 292 lb (132.5 kg)   SpO2 98%   BMI 39.60 kg/m   Physical Exam Vitals signs reviewed.  Cardiovascular:     Rate and Rhythm: Normal rate and regular rhythm.     Heart sounds: Normal heart sounds.  Pulmonary:     Breath sounds: Normal breath sounds.  Abdominal:     Palpations: Abdomen is soft.     Tenderness: There is no abdominal tenderness.  Musculoskeletal: Normal range of motion.  Skin:    General: Skin is warm and dry.  Neurological:     Mental Status: He is alert and oriented to person, place, and time.  Psychiatric:        Mood and Affect: Mood normal.        Behavior: Behavior normal.        Thought Content: Thought content normal.        Judgment: Judgment normal.     Imaging: No results found.  Labs:  CBC: Recent Labs    04/17/17 1405 02/12/18 1258 03/25/18 0930  WBC 9.8 7.9 7.9  HGB 16.6 15.1 14.9  HCT 48.4 45.5 46.1  PLT 236 254 242    COAGS: Recent Labs    01/08/18 1437 03/26/18 0736  INR 1.0 1.0  APTT 30 33    BMP: Recent Labs    04/17/17 1405 02/12/18 1258 03/25/18 0930  NA 136 138 140  K 4.1 4.3 3.9  CL 99 100 102  CO2 25 30 28   GLUCOSE 92 134* 118*  BUN 18 16 16   CALCIUM 10.1 10.3 9.5  CREATININE 1.06 1.10 0.86  GFRNONAA  --  >60 >60  GFRAA  --  >60 >60    LIVER FUNCTION TESTS: Recent Labs    04/17/17 1405 02/12/18 1258  BILITOT 0.9 0.5  AST 39* 22  ALT 30 19  ALKPHOS  --  88  PROT 7.4  7.7 6.9  ALBUMIN  --  4.4    TUMOR MARKERS: No results for input(s): AFPTM, CEA, CA199, CHROMGRNA in the last 8760 hours.  Assessment and Plan:  Left hip surgery tomorrow Avascular necrosis Known B DVT and Hx of same On lifelong anticoagulation  Off Eliquis now for hip surgery Scheduled for retrievable inferior vena cava filter placement Risks  and benefits discussed with the patient including, but not limited to bleeding, infection, contrast induced renal failure, filter fracture or migration  which can lead to emergency surgery or even death, strut penetration with damage or irritation to adjacent structures and caval thrombosis.  All of the patient's questions were answered, patient is agreeable to proceed. Consent signed and in chart.   Thank you for this interesting consult.  I greatly enjoyed meeting Travis Huff and look forward to participating in their care.  A copy of this report was sent to the requesting provider on this date.  Electronically Signed: Lavonia Drafts, PA-C 03/26/2018, 8:28 AM   I spent a total of  30 Minutes   in face to face in clinical consultation, greater than 50% of which was counseling/coordinating care for retrievable inferior vena cava filter placement

## 2018-03-26 NOTE — Anesthesia Preprocedure Evaluation (Addendum)
Anesthesia Evaluation  Patient identified by MRN, date of birth, ID band Patient awake  General Assessment Comment:Complication of anesthesia  ketamine hallucinations   Reviewed: Allergy & Precautions, H&P , NPO status , Patient's Chart, lab work & pertinent test results, reviewed documented beta blocker date and time   History of Anesthesia Complications (+) history of anesthetic complications  Airway Mallampati: I  TM Distance: >3 FB Neck ROM: full    Dental no notable dental hx.    Pulmonary neg pulmonary ROS, former smoker,    Pulmonary exam normal breath sounds clear to auscultation       Cardiovascular Exercise Tolerance: Good hypertension, Pt. on medications and Pt. on home beta blockers negative cardio ROS   Rhythm:regular Rate:Normal     Neuro/Psych negative neurological ROS  negative psych ROS   GI/Hepatic GERD  Medicated,(+)     substance abuse  ,   Endo/Other  negative endocrine ROS  Renal/GU negative Renal ROS  negative genitourinary   Musculoskeletal  (+) Arthritis , Osteoarthritis,    Abdominal   Peds  Hematology negative hematology ROS (+)   Anesthesia Other Findings   Reproductive/Obstetrics negative OB ROS                            Anesthesia Physical Anesthesia Plan  ASA: III  Anesthesia Plan: General   Post-op Pain Management:    Induction: Intravenous  PONV Risk Score and Plan: 1 and Treatment may vary due to age or medical condition and Ondansetron  Airway Management Planned: LMA and Oral ETT  Additional Equipment:   Intra-op Plan:   Post-operative Plan: Extubation in OR  Informed Consent: I have reviewed the patients History and Physical, chart, labs and discussed the procedure including the risks, benefits and alternatives for the proposed anesthesia with the patient or authorized representative who has indicated his/her understanding and  acceptance.     Dental Advisory Given  Plan Discussed with: CRNA, Anesthesiologist and Surgeon  Anesthesia Plan Comments: (  )       Anesthesia Quick Evaluation

## 2018-03-26 NOTE — Discharge Instructions (Signed)
Moderate Conscious Sedation, Adult, Care After These instructions provide you with information about caring for yourself after your procedure. Your health care provider may also give you more specific instructions. Your treatment has been planned according to current medical practices, but problems sometimes occur. Call your health care provider if you have any problems or questions after your procedure. What can I expect after the procedure? After your procedure, it is common:  To feel sleepy for several hours.  To feel clumsy and have poor balance for several hours.  To have poor judgment for several hours.  To vomit if you eat too soon. Follow these instructions at home: For at least 24 hours after the procedure:   Do not: ? Participate in activities where you could fall or become injured. ? Drive. ? Use heavy machinery. ? Drink alcohol. ? Take sleeping pills or medicines that cause drowsiness. ? Make important decisions or sign legal documents. ? Take care of children on your own.  Rest. Eating and drinking  Follow the diet recommended by your health care provider.  If you vomit: ? Drink water, juice, or soup when you can drink without vomiting. ? Make sure you have little or no nausea before eating solid foods. General instructions  Have a responsible adult stay with you until you are awake and alert.  Take over-the-counter and prescription medicines only as told by your health care provider.  If you smoke, do not smoke without supervision.  Keep all follow-up visits as told by your health care provider. This is important. Contact a health care provider if:  You keep feeling nauseous or you keep vomiting.  You feel light-headed.  You develop a rash.  You have a fever. Get help right away if:  You have trouble breathing. This information is not intended to replace advice given to you by your health care provider. Make sure you discuss any questions you have  with your health care provider. Document Released: 10/09/2012 Document Revised: 05/24/2015 Document Reviewed: 04/10/2015 Elsevier Interactive Patient Education  2019 [employer]. Inferior Vena Cava Filter Insertion, Care After This sheet gives you information about how to care for yourself after your procedure. Your health care provider may also give you more specific instructions. If you have problems or questions, contact your health care provider. What can I expect after the procedure? After your procedure, it is common to have:  Mild pain in the area where the filter was inserted.  Mild bruising in the area where the filter was inserted. Follow these instructions at home: Insertion site care   Follow instructions from your health care provider about how to take care of the site where a catheter was inserted at your neck or groin (insertion site). Make sure you: ? Wash your hands with soap and water before you change your bandage (dressing). If soap and water are not available, use hand sanitizer. ? Change your dressing as told by your health care provider.  Check your insertion site every day for signs of infection. Check for: ? More redness, swelling, or pain. ? More fluid or blood. ? Warmth. ? Pus or a bad smell.  Keep the insertion site clean and dry.  Do not shower, bathe, use a hot tub, or let the dressing get wet until your health care provider approves. General instructions  Take over-the-counter and prescription medicines only as told by your health care provider.  Avoid heavy lifting or hard activities for 48 hours after the procedure or as told  by your health care provider.  Do not drive for 24 hours if you were given a medicine to help you relax (sedative).  Do not drive or use heavy machinery while taking prescription pain medicine.  Do not go back to school or work until your health care provider approves.  Keep all follow-up visits as told by your health  care provider. This is important. Contact a health care provider if:  You have more redness, swelling, or pain around your insertion site.  You have more fluid or blood coming from your insertion site.  Your insertion site feels warm to the touch.  You have pus or a bad smell coming from your insertion site.  You have a fever.  You are dizzy.  You have nausea and vomiting.  You develop a rash. Get help right away if:  You develop chest pain, a cough, or difficulty breathing.  You develop shortness of breath, feel faint, or pass out.  You cough up blood.  You have severe pain in your abdomen.  You develop swelling and discoloration or pain in your legs.  Your legs become pale and cold or blue.  You develop weakness, difficulty moving your arms or legs, or balance problems.  You develop problems with speech or vision. These symptoms may represent a serious problem that is an emergency. Do not wait to see if the symptoms will go away. Get medical help right away. Call your local emergency services (911 in the [place] Do not drive yourself to the hospital. Summary  After your insertion procedure, it is common to have mild pain and bruising.  Do not shower, bathe, use a hot tub, or let the dressing get wet until your health care provider approves.  Every day, check for signs of infection where a catheter was inserted at your neck or groin (insertion site). This information is not intended to replace advice given to you by your health care provider. Make sure you discuss any questions you have with your health care provider. Document Released: 10/09/2012 Document Revised: 11/10/2015 Document Reviewed: 11/10/2015 Elsevier Interactive Patient Education  2019 [employer].

## 2018-03-26 NOTE — Sedation Documentation (Signed)
Called to give report. Nurse unavailable. Will call back 

## 2018-03-27 ENCOUNTER — Other Ambulatory Visit: Payer: Self-pay

## 2018-03-27 ENCOUNTER — Encounter (HOSPITAL_COMMUNITY)
Admission: RE | Disposition: A | Discharge status: Home / Self Care | Payer: Self-pay | Source: Home / Self Care | Attending: Orthopedic Surgery

## 2018-03-27 ENCOUNTER — Inpatient Hospital Stay (HOSPITAL_COMMUNITY): Payer: 59

## 2018-03-27 ENCOUNTER — Inpatient Hospital Stay (HOSPITAL_COMMUNITY): Payer: 59 | Admitting: Physician Assistant

## 2018-03-27 ENCOUNTER — Telehealth (HOSPITAL_COMMUNITY): Payer: Self-pay | Admitting: *Deleted

## 2018-03-27 ENCOUNTER — Inpatient Hospital Stay (HOSPITAL_COMMUNITY)
Admission: RE | Admit: 2018-03-27 | Discharge: 2018-03-28 | DRG: 470 | Disposition: A | Discharge status: Home / Self Care | Payer: 59 | Attending: Orthopedic Surgery | Admitting: Orthopedic Surgery

## 2018-03-27 ENCOUNTER — Encounter (HOSPITAL_COMMUNITY): Payer: Self-pay | Admitting: Emergency Medicine

## 2018-03-27 DIAGNOSIS — I10 Essential (primary) hypertension: Secondary | ICD-10-CM | POA: Diagnosis not present

## 2018-03-27 DIAGNOSIS — Z981 Arthrodesis status: Secondary | ICD-10-CM | POA: Diagnosis not present

## 2018-03-27 DIAGNOSIS — J45909 Unspecified asthma, uncomplicated: Secondary | ICD-10-CM | POA: Diagnosis present

## 2018-03-27 DIAGNOSIS — Z884 Allergy status to anesthetic agent status: Secondary | ICD-10-CM | POA: Diagnosis not present

## 2018-03-27 DIAGNOSIS — Z86711 Personal history of pulmonary embolism: Secondary | ICD-10-CM | POA: Diagnosis not present

## 2018-03-27 DIAGNOSIS — Z96653 Presence of artificial knee joint, bilateral: Secondary | ICD-10-CM | POA: Diagnosis present

## 2018-03-27 DIAGNOSIS — Z6841 Body Mass Index (BMI) 40.0 and over, adult: Secondary | ICD-10-CM | POA: Diagnosis not present

## 2018-03-27 DIAGNOSIS — Z9181 History of falling: Secondary | ICD-10-CM

## 2018-03-27 DIAGNOSIS — M87852 Other osteonecrosis, left femur: Secondary | ICD-10-CM | POA: Diagnosis not present

## 2018-03-27 DIAGNOSIS — I82461 Acute embolism and thrombosis of right calf muscular vein: Secondary | ICD-10-CM

## 2018-03-27 DIAGNOSIS — K219 Gastro-esophageal reflux disease without esophagitis: Secondary | ICD-10-CM | POA: Diagnosis present

## 2018-03-27 DIAGNOSIS — Z96641 Presence of right artificial hip joint: Secondary | ICD-10-CM | POA: Diagnosis present

## 2018-03-27 DIAGNOSIS — M25559 Pain in unspecified hip: Secondary | ICD-10-CM

## 2018-03-27 DIAGNOSIS — Z79899 Other long term (current) drug therapy: Secondary | ICD-10-CM | POA: Diagnosis not present

## 2018-03-27 DIAGNOSIS — Z96643 Presence of artificial hip joint, bilateral: Secondary | ICD-10-CM | POA: Diagnosis present

## 2018-03-27 DIAGNOSIS — I452 Bifascicular block: Secondary | ICD-10-CM | POA: Diagnosis present

## 2018-03-27 DIAGNOSIS — M1612 Unilateral primary osteoarthritis, left hip: Secondary | ICD-10-CM | POA: Diagnosis present

## 2018-03-27 DIAGNOSIS — Z09 Encounter for follow-up examination after completed treatment for conditions other than malignant neoplasm: Secondary | ICD-10-CM

## 2018-03-27 DIAGNOSIS — Z7901 Long term (current) use of anticoagulants: Secondary | ICD-10-CM

## 2018-03-27 DIAGNOSIS — Z96642 Presence of left artificial hip joint: Secondary | ICD-10-CM | POA: Diagnosis not present

## 2018-03-27 DIAGNOSIS — M87052 Idiopathic aseptic necrosis of left femur: Secondary | ICD-10-CM | POA: Diagnosis not present

## 2018-03-27 DIAGNOSIS — M25752 Osteophyte, left hip: Secondary | ICD-10-CM | POA: Diagnosis present

## 2018-03-27 DIAGNOSIS — Z87891 Personal history of nicotine dependence: Secondary | ICD-10-CM | POA: Diagnosis not present

## 2018-03-27 DIAGNOSIS — M7989 Other specified soft tissue disorders: Secondary | ICD-10-CM

## 2018-03-27 DIAGNOSIS — Z885 Allergy status to narcotic agent status: Secondary | ICD-10-CM

## 2018-03-27 DIAGNOSIS — Z86718 Personal history of other venous thrombosis and embolism: Secondary | ICD-10-CM

## 2018-03-27 DIAGNOSIS — M87059 Idiopathic aseptic necrosis of unspecified femur: Secondary | ICD-10-CM | POA: Insufficient documentation

## 2018-03-27 HISTORY — PX: TOTAL HIP ARTHROPLASTY: SHX124

## 2018-03-27 LAB — PREPARE RBC (CROSSMATCH)

## 2018-03-27 SURGERY — ARTHROPLASTY, HIP, TOTAL, ANTERIOR APPROACH
Anesthesia: Monitor Anesthesia Care | Site: Hip | Laterality: Left

## 2018-03-27 MED ORDER — ISOPROPYL ALCOHOL 70 % SOLN
Status: DC | PRN
  Administered 2018-03-27: 1 via TOPICAL

## 2018-03-27 MED ORDER — SODIUM CHLORIDE 0.9 % IV SOLN: INTRAVENOUS | Status: DC

## 2018-03-27 MED ORDER — FENTANYL CITRATE (PF) 100 MCG/2ML IJ SOLN
INTRAMUSCULAR | Status: DC | PRN
  Administered 2018-03-27 (×2): 50 ug via INTRAVENOUS
  Administered 2018-03-27: 100 ug via INTRAVENOUS
  Administered 2018-03-27 (×2): 25 ug via INTRAVENOUS

## 2018-03-27 MED ORDER — DEXMEDETOMIDINE HCL IN NACL 200 MCG/50ML IV SOLN
INTRAVENOUS | Status: AC
  Filled 2018-03-27: qty 50

## 2018-03-27 MED ORDER — SUGAMMADEX SODIUM 500 MG/5ML IV SOLN
INTRAVENOUS | Status: DC | PRN
  Administered 2018-03-27: 350 mg via INTRAVENOUS

## 2018-03-27 MED ORDER — DEXMEDETOMIDINE HCL IN NACL 200 MCG/50ML IV SOLN
INTRAVENOUS | Status: DC | PRN
  Administered 2018-03-27: 8 ug via INTRAVENOUS
  Administered 2018-03-27 (×4): 4 ug via INTRAVENOUS
  Administered 2018-03-27: 8 ug via INTRAVENOUS

## 2018-03-27 MED ORDER — CHLORHEXIDINE GLUCONATE 4 % EX LIQD: 60.0000 mL | Freq: Once | CUTANEOUS | Status: DC

## 2018-03-27 MED ORDER — CEFAZOLIN SODIUM-DEXTROSE 2-4 GM/100ML-% IV SOLN
2.0000 g | Freq: Four times a day (QID) | INTRAVENOUS | Status: AC
  Administered 2018-03-27 (×2): 2 g via INTRAVENOUS
  Filled 2018-03-27 (×2): qty 100

## 2018-03-27 MED ORDER — POVIDONE-IODINE 10 % EX SWAB
2.0000 "application " | Freq: Once | CUTANEOUS | Status: AC
  Administered 2018-03-27: 2 via TOPICAL

## 2018-03-27 MED ORDER — ACETAMINOPHEN 325 MG PO TABS: 325.0000 mg | ORAL_TABLET | ORAL | Status: DC | PRN

## 2018-03-27 MED ORDER — PHENOL 1.4 % MT LIQD: 1.0000 | OROMUCOSAL | Status: DC | PRN

## 2018-03-27 MED ORDER — ALBUMIN HUMAN 5 % IV SOLN
INTRAVENOUS | Status: AC
  Filled 2018-03-27: qty 500

## 2018-03-27 MED ORDER — HYDROMORPHONE HCL 1 MG/ML IJ SOLN: 0.5000 mg | INTRAMUSCULAR | Status: DC | PRN

## 2018-03-27 MED ORDER — PROPOFOL 10 MG/ML IV BOLUS
INTRAVENOUS | Status: DC | PRN
  Administered 2018-03-27: 150 mg via INTRAVENOUS

## 2018-03-27 MED ORDER — BUPIVACAINE-EPINEPHRINE 0.25% -1:200000 IJ SOLN
INTRAMUSCULAR | Status: DC | PRN
  Administered 2018-03-27: 30 mL

## 2018-03-27 MED ORDER — MIDAZOLAM HCL 2 MG/2ML IJ SOLN
INTRAMUSCULAR | Status: AC
  Filled 2018-03-27: qty 2

## 2018-03-27 MED ORDER — HYDROMORPHONE HCL 2 MG PO TABS
2.0000 mg | ORAL_TABLET | ORAL | Status: DC | PRN
  Filled 2018-03-27: qty 2

## 2018-03-27 MED ORDER — LISINOPRIL-HYDROCHLOROTHIAZIDE 20-25 MG PO TABS: 1.0000 | ORAL_TABLET | Freq: Every day | ORAL | Status: DC

## 2018-03-27 MED ORDER — DEXAMETHASONE SODIUM PHOSPHATE 10 MG/ML IJ SOLN
10.0000 mg | Freq: Once | INTRAMUSCULAR | Status: AC
  Administered 2018-03-28: 10 mg via INTRAVENOUS
  Filled 2018-03-27: qty 1

## 2018-03-27 MED ORDER — HYDROCHLOROTHIAZIDE 25 MG PO TABS
25.0000 mg | ORAL_TABLET | Freq: Every day | ORAL | Status: DC
  Administered 2018-03-28: 25 mg via ORAL
  Filled 2018-03-27: qty 1

## 2018-03-27 MED ORDER — ROCURONIUM BROMIDE 10 MG/ML (PF) SYRINGE
PREFILLED_SYRINGE | INTRAVENOUS | Status: AC
  Filled 2018-03-27: qty 10

## 2018-03-27 MED ORDER — OXYCODONE HCL 5 MG PO TABS: 5.0000 mg | ORAL_TABLET | Freq: Once | ORAL | Status: DC | PRN

## 2018-03-27 MED ORDER — POLYETHYLENE GLYCOL 3350 17 G PO PACK: 17.0000 g | PACK | Freq: Every day | ORAL | Status: DC | PRN

## 2018-03-27 MED ORDER — DIAZEPAM 5 MG PO TABS
5.0000 mg | ORAL_TABLET | Freq: Every day | ORAL | Status: DC
  Administered 2018-03-27: 5 mg via ORAL
  Filled 2018-03-27: qty 1

## 2018-03-27 MED ORDER — METOCLOPRAMIDE HCL 5 MG/ML IJ SOLN: 5.0000 mg | Freq: Three times a day (TID) | INTRAMUSCULAR | Status: DC | PRN

## 2018-03-27 MED ORDER — ONDANSETRON HCL 4 MG/2ML IJ SOLN: 4.0000 mg | Freq: Four times a day (QID) | INTRAMUSCULAR | Status: DC | PRN

## 2018-03-27 MED ORDER — METOCLOPRAMIDE HCL 5 MG PO TABS: 5.0000 mg | ORAL_TABLET | Freq: Three times a day (TID) | ORAL | Status: DC | PRN

## 2018-03-27 MED ORDER — SODIUM CHLORIDE 0.9% IV SOLUTION: Freq: Once | INTRAVENOUS | Status: DC

## 2018-03-27 MED ORDER — SODIUM CHLORIDE (PF) 0.9 % IJ SOLN
INTRAMUSCULAR | Status: AC
  Filled 2018-03-27: qty 50

## 2018-03-27 MED ORDER — ONDANSETRON HCL 4 MG PO TABS: 4.0000 mg | ORAL_TABLET | Freq: Four times a day (QID) | ORAL | Status: DC | PRN

## 2018-03-27 MED ORDER — KETOROLAC TROMETHAMINE 15 MG/ML IJ SOLN
15.0000 mg | Freq: Four times a day (QID) | INTRAMUSCULAR | Status: DC
  Administered 2018-03-27 – 2018-03-28 (×3): 15 mg via INTRAVENOUS
  Filled 2018-03-27 (×3): qty 1

## 2018-03-27 MED ORDER — ISOPROPYL ALCOHOL 70 % SOLN
Status: AC
  Filled 2018-03-27: qty 480

## 2018-03-27 MED ORDER — FENTANYL CITRATE (PF) 250 MCG/5ML IJ SOLN
INTRAMUSCULAR | Status: AC
  Filled 2018-03-27: qty 5

## 2018-03-27 MED ORDER — DULOXETINE HCL 60 MG PO CPEP
60.0000 mg | ORAL_CAPSULE | Freq: Every day | ORAL | Status: DC
  Administered 2018-03-27: 60 mg via ORAL
  Filled 2018-03-27: qty 1

## 2018-03-27 MED ORDER — ACETAMINOPHEN 160 MG/5ML PO SOLN: 325.0000 mg | ORAL | Status: DC | PRN

## 2018-03-27 MED ORDER — KETOROLAC TROMETHAMINE 30 MG/ML IJ SOLN
INTRAMUSCULAR | Status: AC
  Filled 2018-03-27: qty 1

## 2018-03-27 MED ORDER — SODIUM CHLORIDE 0.9 % IV SOLN
INTRAVENOUS | Status: DC
  Administered 2018-03-27 – 2018-03-28 (×2): via INTRAVENOUS

## 2018-03-27 MED ORDER — OXYCODONE HCL ER 20 MG PO T12A
20.0000 mg | EXTENDED_RELEASE_TABLET | Freq: Two times a day (BID) | ORAL | Status: DC
  Administered 2018-03-27 – 2018-03-28 (×3): 20 mg via ORAL
  Filled 2018-03-27 (×3): qty 1

## 2018-03-27 MED ORDER — ONDANSETRON HCL 4 MG/2ML IJ SOLN: 4.0000 mg | Freq: Once | INTRAMUSCULAR | Status: DC | PRN

## 2018-03-27 MED ORDER — GLYCOPYRROLATE PF 0.2 MG/ML IJ SOSY
PREFILLED_SYRINGE | INTRAMUSCULAR | Status: DC | PRN
  Administered 2018-03-27: .2 mg via INTRAVENOUS

## 2018-03-27 MED ORDER — PHENYLEPHRINE HCL 10 MG/ML IJ SOLN
INTRAMUSCULAR | Status: AC
  Filled 2018-03-27: qty 1

## 2018-03-27 MED ORDER — MIDAZOLAM HCL 5 MG/5ML IJ SOLN
INTRAMUSCULAR | Status: DC | PRN
  Administered 2018-03-27: 2 mg via INTRAVENOUS

## 2018-03-27 MED ORDER — METHOCARBAMOL 500 MG PO TABS: 500.0000 mg | ORAL_TABLET | Freq: Four times a day (QID) | ORAL | Status: DC | PRN

## 2018-03-27 MED ORDER — OXYCODONE HCL 5 MG/5ML PO SOLN: 5.0000 mg | Freq: Once | ORAL | Status: DC | PRN

## 2018-03-27 MED ORDER — KETOROLAC TROMETHAMINE 30 MG/ML IJ SOLN
INTRAMUSCULAR | Status: DC | PRN
  Administered 2018-03-27: 30 mg

## 2018-03-27 MED ORDER — 0.9 % SODIUM CHLORIDE (POUR BTL) OPTIME
TOPICAL | Status: DC | PRN
  Administered 2018-03-27: 1000 mL

## 2018-03-27 MED ORDER — ACETAMINOPHEN 10 MG/ML IV SOLN
1000.0000 mg | INTRAVENOUS | Status: AC
  Administered 2018-03-27: 1000 mg via INTRAVENOUS
  Filled 2018-03-27: qty 100

## 2018-03-27 MED ORDER — DIPHENHYDRAMINE HCL 12.5 MG/5ML PO ELIX: 12.5000 mg | ORAL_SOLUTION | ORAL | Status: DC | PRN

## 2018-03-27 MED ORDER — LISINOPRIL 20 MG PO TABS
20.0000 mg | ORAL_TABLET | Freq: Every day | ORAL | Status: DC
  Administered 2018-03-28: 20 mg via ORAL
  Filled 2018-03-27: qty 1

## 2018-03-27 MED ORDER — ALUM & MAG HYDROXIDE-SIMETH 200-200-20 MG/5ML PO SUSP: 30.0000 mL | ORAL | Status: DC | PRN

## 2018-03-27 MED ORDER — FENTANYL CITRATE (PF) 100 MCG/2ML IJ SOLN: 25.0000 ug | INTRAMUSCULAR | Status: DC | PRN

## 2018-03-27 MED ORDER — ALBUMIN HUMAN 5 % IV SOLN
INTRAVENOUS | Status: DC | PRN
  Administered 2018-03-27 (×2): via INTRAVENOUS

## 2018-03-27 MED ORDER — METHOCARBAMOL 500 MG IVPB - SIMPLE MED
500.0000 mg | Freq: Four times a day (QID) | INTRAVENOUS | Status: DC | PRN
  Filled 2018-03-27: qty 50

## 2018-03-27 MED ORDER — SODIUM CHLORIDE 0.9 % IV SOLN
INTRAVENOUS | Status: DC | PRN
  Administered 2018-03-27: 20 ug/min via INTRAVENOUS

## 2018-03-27 MED ORDER — DEXAMETHASONE SODIUM PHOSPHATE 10 MG/ML IJ SOLN
INTRAMUSCULAR | Status: DC | PRN
  Administered 2018-03-27: 10 mg via INTRAVENOUS

## 2018-03-27 MED ORDER — SENNA 8.6 MG PO TABS
1.0000 | ORAL_TABLET | Freq: Two times a day (BID) | ORAL | Status: DC
  Administered 2018-03-27 – 2018-03-28 (×2): 8.6 mg via ORAL
  Filled 2018-03-27 (×2): qty 1

## 2018-03-27 MED ORDER — WATER FOR IRRIGATION, STERILE IR SOLN
Status: DC | PRN
  Administered 2018-03-27: 2000 mL

## 2018-03-27 MED ORDER — ROCURONIUM BROMIDE 10 MG/ML (PF) SYRINGE
PREFILLED_SYRINGE | INTRAVENOUS | Status: DC | PRN
  Administered 2018-03-27: 70 mg via INTRAVENOUS
  Administered 2018-03-27: 20 mg via INTRAVENOUS

## 2018-03-27 MED ORDER — ONDANSETRON HCL 4 MG/2ML IJ SOLN
INTRAMUSCULAR | Status: DC | PRN
  Administered 2018-03-27: 4 mg via INTRAVENOUS

## 2018-03-27 MED ORDER — LIDOCAINE 2% (20 MG/ML) 5 ML SYRINGE
INTRAMUSCULAR | Status: AC
  Filled 2018-03-27: qty 5

## 2018-03-27 MED ORDER — LACTATED RINGERS IV SOLN
INTRAVENOUS | Status: DC
  Administered 2018-03-27 (×4): via INTRAVENOUS

## 2018-03-27 MED ORDER — MEPERIDINE HCL 50 MG/ML IJ SOLN: 6.2500 mg | INTRAMUSCULAR | Status: DC | PRN

## 2018-03-27 MED ORDER — DEXAMETHASONE SODIUM PHOSPHATE 10 MG/ML IJ SOLN
INTRAMUSCULAR | Status: AC
  Filled 2018-03-27: qty 2

## 2018-03-27 MED ORDER — PROPOFOL 10 MG/ML IV BOLUS
INTRAVENOUS | Status: AC
  Filled 2018-03-27: qty 40

## 2018-03-27 MED ORDER — APIXABAN 2.5 MG PO TABS
2.5000 mg | ORAL_TABLET | Freq: Two times a day (BID) | ORAL | Status: DC
  Administered 2018-03-28: 2.5 mg via ORAL
  Filled 2018-03-27: qty 1

## 2018-03-27 MED ORDER — ATENOLOL 50 MG PO TABS
100.0000 mg | ORAL_TABLET | Freq: Every day | ORAL | Status: DC
  Administered 2018-03-28: 100 mg via ORAL
  Filled 2018-03-27: qty 2

## 2018-03-27 MED ORDER — LIDOCAINE 2% (20 MG/ML) 5 ML SYRINGE
INTRAMUSCULAR | Status: DC | PRN
  Administered 2018-03-27: 20 mg via INTRAVENOUS
  Administered 2018-03-27: 30 mg via INTRAVENOUS
  Administered 2018-03-27: 50 mg via INTRAVENOUS

## 2018-03-27 MED ORDER — SODIUM CHLORIDE 0.9 % IR SOLN
Status: DC | PRN
  Administered 2018-03-27: 1000 mL
  Administered 2018-03-27: 3000 mL

## 2018-03-27 MED ORDER — SUGAMMADEX SODIUM 500 MG/5ML IV SOLN: INTRAVENOUS | Status: DC | PRN

## 2018-03-27 MED ORDER — DOCUSATE SODIUM 100 MG PO CAPS
100.0000 mg | ORAL_CAPSULE | Freq: Two times a day (BID) | ORAL | Status: DC
  Administered 2018-03-27 – 2018-03-28 (×2): 100 mg via ORAL
  Filled 2018-03-27 (×2): qty 1

## 2018-03-27 MED ORDER — SUGAMMADEX SODIUM 500 MG/5ML IV SOLN
INTRAVENOUS | Status: AC
  Filled 2018-03-27: qty 5

## 2018-03-27 MED ORDER — ACETAMINOPHEN 325 MG PO TABS: 325.0000 mg | ORAL_TABLET | Freq: Four times a day (QID) | ORAL | Status: DC | PRN

## 2018-03-27 MED ORDER — TRANEXAMIC ACID-NACL 1000-0.7 MG/100ML-% IV SOLN
1000.0000 mg | INTRAVENOUS | Status: AC
  Administered 2018-03-27: 1000 mg via INTRAVENOUS
  Filled 2018-03-27: qty 100

## 2018-03-27 MED ORDER — BUPIVACAINE-EPINEPHRINE (PF) 0.25% -1:200000 IJ SOLN
INTRAMUSCULAR | Status: AC
  Filled 2018-03-27: qty 30

## 2018-03-27 MED ORDER — MENTHOL 3 MG MT LOZG: 1.0000 | LOZENGE | OROMUCOSAL | Status: DC | PRN

## 2018-03-27 MED ORDER — ONDANSETRON HCL 4 MG/2ML IJ SOLN
INTRAMUSCULAR | Status: AC
  Filled 2018-03-27: qty 4

## 2018-03-27 MED ORDER — SODIUM CHLORIDE (PF) 0.9 % IJ SOLN
INTRAMUSCULAR | Status: DC | PRN
  Administered 2018-03-27: 30 mL

## 2018-03-27 SURGICAL SUPPLY — 57 items
BAG DECANTER FOR FLEXI CONT (MISCELLANEOUS) IMPLANT
BAG ZIPLOCK 12X15 (MISCELLANEOUS) IMPLANT
BLADE SURG SZ10 CARB STEEL (BLADE) ×6 IMPLANT
CHLORAPREP W/TINT 26ML (MISCELLANEOUS) ×3 IMPLANT
CLOTH BEACON ORANGE TIMEOUT ST (SAFETY) ×3 IMPLANT
COVER PERINEAL POST (MISCELLANEOUS) ×3 IMPLANT
COVER SURGICAL LIGHT HANDLE (MISCELLANEOUS) ×3 IMPLANT
COVER WAND RF STERILE (DRAPES) IMPLANT
CUP ACETAB 58MM ×3 IMPLANT
DECANTER SPIKE VIAL GLASS SM (MISCELLANEOUS) ×3 IMPLANT
DERMABOND ADVANCED (GAUZE/BANDAGES/DRESSINGS) ×4
DERMABOND ADVANCED .7 DNX12 (GAUZE/BANDAGES/DRESSINGS) ×2 IMPLANT
DRAPE IMP U-DRAPE 54X76 (DRAPES) ×3 IMPLANT
DRAPE SHEET LG 3/4 BI-LAMINATE (DRAPES) ×9 IMPLANT
DRAPE STERI IOBAN 125X83 (DRAPES) ×3 IMPLANT
DRAPE U-SHAPE 47X51 STRL (DRAPES) ×6 IMPLANT
DRSG AQUACEL AG ADV 3.5X10 (GAUZE/BANDAGES/DRESSINGS) ×3 IMPLANT
ELECT BLADE TIP CTD 4 INCH (ELECTRODE) ×3 IMPLANT
ELECT PENCIL ROCKER SW 15FT (MISCELLANEOUS) ×3 IMPLANT
ELECT REM PT RETURN 15FT ADLT (MISCELLANEOUS) ×3 IMPLANT
GAUZE SPONGE 4X4 12PLY STRL (GAUZE/BANDAGES/DRESSINGS) ×3 IMPLANT
GLOVE BIO SURGEON STRL SZ8.5 (GLOVE) ×6 IMPLANT
GLOVE BIOGEL PI IND STRL 8.5 (GLOVE) ×1 IMPLANT
GLOVE BIOGEL PI INDICATOR 8.5 (GLOVE) ×2
GOWN SPEC L3 XXLG W/TWL (GOWN DISPOSABLE) ×3 IMPLANT
HANDPIECE INTERPULSE COAX TIP (DISPOSABLE) ×2
HEAD CERAMIC DELTA 36 PLUS 1.5 (Hips) ×3 IMPLANT
HOLDER FOLEY CATH W/STRAP (MISCELLANEOUS) ×3 IMPLANT
HOOD PEEL AWAY FLYTE STAYCOOL (MISCELLANEOUS) ×12 IMPLANT
KIT TURNOVER KIT A (KITS) IMPLANT
LINER NEUTRAL 52X36X58N (Liner) ×3 IMPLANT
MANIFOLD NEPTUNE II (INSTRUMENTS) ×3 IMPLANT
MARKER SKIN DUAL TIP RULER LAB (MISCELLANEOUS) ×3 IMPLANT
NDL SAFETY ECLIPSE 18X1.5 (NEEDLE) ×1 IMPLANT
NEEDLE HYPO 18GX1.5 SHARP (NEEDLE) ×2
NEEDLE SPNL 18GX3.5 QUINCKE PK (NEEDLE) ×3 IMPLANT
PACK ANTERIOR HIP CUSTOM (KITS) ×3 IMPLANT
SAW OSC TIP CART 19.5X105X1.3 (SAW) ×3 IMPLANT
SCREW 6.5MMX35MM (Screw) ×3 IMPLANT
SEALER BIPOLAR AQUA 6.0 (INSTRUMENTS) ×3 IMPLANT
SET HNDPC FAN SPRY TIP SCT (DISPOSABLE) ×1 IMPLANT
STEM TRI LOC BPS SZ7 W GRIPTON (Hips) ×1 IMPLANT
SUT ETHIBOND NAB CT1 #1 30IN (SUTURE) ×6 IMPLANT
SUT MNCRL AB 3-0 PS2 18 (SUTURE) ×3 IMPLANT
SUT MNCRL AB 4-0 PS2 18 (SUTURE) ×3 IMPLANT
SUT MON AB 2-0 CT1 36 (SUTURE) ×6 IMPLANT
SUT STRATAFIX PDO 1 14 VIOLET (SUTURE) ×2
SUT STRATFX PDO 1 14 VIOLET (SUTURE) ×1
SUT VIC AB 2-0 CT1 27 (SUTURE) ×2
SUT VIC AB 2-0 CT1 TAPERPNT 27 (SUTURE) ×1 IMPLANT
SUTURE STRATFX PDO 1 14 VIOLET (SUTURE) ×1 IMPLANT
SYR 3ML LL SCALE MARK (SYRINGE) ×6 IMPLANT
SYR 50ML LL SCALE MARK (SYRINGE) ×3 IMPLANT
TRAY FOLEY MTR SLVR 16FR STAT (SET/KITS/TRAYS/PACK) IMPLANT
TRI LOC BPS SZ 7 W GRIPTON (Hips) ×3 IMPLANT
WATER STERILE IRR 1000ML POUR (IV SOLUTION) ×3 IMPLANT
YANKAUER SUCT BULB TIP 10FT TU (MISCELLANEOUS) ×3 IMPLANT

## 2018-03-27 NOTE — Discharge Instructions (Signed)
°Dr. Anelise Staron °Joint Replacement Specialist °[facility] °[address] °[place], [place] [address] °[phone] ° ° °TOTAL HIP REPLACEMENT POSTOPERATIVE DIRECTIONS ° ° ° °Hip Rehabilitation, Guidelines Following Surgery  ° °WEIGHT BEARING °Weight bearing as tolerated with assist device (walker, cane, etc) as directed, use it as long as suggested by your surgeon or therapist, typically at least 4-6 weeks. ° °The results of a hip operation are greatly improved after range of motion and muscle strengthening exercises. Follow all safety measures which are given to protect your hip. If any of these exercises cause increased pain or swelling in your joint, decrease the amount until you are comfortable again. Then slowly increase the exercises. Call your caregiver if you have problems or questions.  ° °HOME CARE INSTRUCTIONS  °Most of the following instructions are designed to prevent the dislocation of your new hip.  °Remove items at home which could result in a fall. This includes throw rugs or furniture in walking pathways.  °Continue medications as instructed at time of discharge. °· You may have some home medications which will be placed on hold until you complete the course of blood thinner medication. °· You may start showering once you are discharged home. Do not remove your dressing. °Do not put on socks or shoes without following the instructions of your caregivers.   °Sit on chairs with arms. Use the chair arms to help push yourself up when arising.  °Arrange for the use of a toilet seat elevator so you are not sitting low.  °· Walk with walker as instructed.  °You may resume a sexual relationship in one month or when given the OK by your caregiver.  °Use walker as long as suggested by your caregivers.  °You may put full weight on your legs and walk as much as is comfortable. °Avoid periods of inactivity such as sitting longer than an hour when not asleep. This helps prevent  blood clots.  °You may return to work once you are cleared by your surgeon.  °Do not drive a car for 6 weeks or until released by your surgeon.  °Do not drive while taking narcotics.  °Wear elastic stockings for two weeks following surgery during the day but you may remove then at night.  °Make sure you keep all of your appointments after your operation with all of your doctors and caregivers. You should call the office at the above phone number and make an appointment for approximately two weeks after the date of your surgery. °Please pick up a stool softener and laxative for home use as long as you are requiring pain medications. °· ICE to the affected hip every three hours for 30 minutes at a time and then as needed for pain and swelling. Continue to use ice on the hip for pain and swelling from surgery. You may notice swelling that will progress down to the foot and ankle.  This is normal after surgery.  Elevate the leg when you are not up walking on it.   °It is important for you to complete the blood thinner medication as prescribed by your doctor. °· Continue to use the breathing machine which will help keep your temperature down.  It is common for your temperature to cycle up and down following surgery, especially at night when you are not up moving around and exerting yourself.  The breathing machine keeps your lungs expanded and your temperature down. ° °RANGE OF MOTION AND STRENGTHENING EXERCISES  °These exercises are   designed to help you keep full movement of your hip joint. Follow your caregiver's or physical therapist's instructions. Perform all exercises about fifteen times, three times per day or as directed. Exercise both hips, even if you have had only one joint replacement. These exercises can be done on a training (exercise) mat, on the floor, on a table or on a bed. Use whatever works the best and is most comfortable for you. Use music or television while you are exercising so that the exercises  are a pleasant break in your day. This will make your life better with the exercises acting as a break in routine you can look forward to.  °Lying on your back, slowly slide your foot toward your buttocks, raising your knee up off the floor. Then slowly slide your foot back down until your leg is straight again.  °Lying on your back spread your legs as far apart as you can without causing discomfort.  °Lying on your side, raise your upper leg and foot straight up from the floor as far as is comfortable. Slowly lower the leg and repeat.  °Lying on your back, tighten up the muscle in the front of your thigh (quadriceps muscles). You can do this by keeping your leg straight and trying to raise your heel off the floor. This helps strengthen the largest muscle supporting your knee.  °Lying on your back, tighten up the muscles of your buttocks both with the legs straight and with the knee bent at a comfortable angle while keeping your heel on the floor.  ° °SKILLED REHAB INSTRUCTIONS: °If the patient is transferred to a skilled rehab facility following release from the hospital, a list of the current medications will be sent to the facility for the patient to continue.  When discharged from the skilled rehab facility, please have the facility set up the patient's Home Health Physical Therapy prior to being released. Also, the skilled facility will be responsible for providing the patient with their medications at time of release from the facility to include their pain medication and their blood thinner medication. If the patient is still at the rehab facility at time of the two week follow up appointment, the skilled rehab facility will also need to assist the patient in arranging follow up appointment in our office and any transportation needs. ° °MAKE SURE YOU:  °Understand these instructions.  °Will watch your condition.  °Will get help right away if you are not doing well or get worse. ° °Pick up stool softner and  laxative for home use following surgery while on pain medications. °Do not remove your dressing. °The dressing is waterproof--it is OK to take showers. °Continue to use ice for pain and swelling after surgery. °Do not use any lotions or creams on the incision until instructed by your surgeon. °Total Hip Protocol. ° ° °

## 2018-03-27 NOTE — Evaluation (Signed)
Physical Therapy Evaluation Patient Details Name: Travis Huff MRN: [id number] DOB: 01-27-1960 Today's Date: 03/27/2018   History of Present Illness  58 yo male s/p L DA-THA due to AVN on 03/27/18. PMH includes DVT, lumbar fusion L2-L5 & L5-S1, tremor, scoliosis, anxiety, obesity, bilateral TKR, R THA.     Clinical Impression  Pt presents with L hip pain, difficulty performing bed mobility/transfers, LLE post-surgical weakness, and decreased activity tolerance due to L hip pain. Pt to benefit from acute PT to address deficits. Pt ambulated hallway distance with RW with min guard assist, verbal cuing for form and safety provided throughout. Pt educated on ankle pumps (20/hour) to perform this afternoon/evening to increase circulation, to pt's tolerance and limited by pain. PT to progress mobility as tolerated, and will continue to follow acutely.        Follow Up Recommendations Follow surgeon's recommendation for DC plan and follow-up therapies;Supervision for mobility/OOB    Equipment Recommendations  None recommended by PT    Recommendations for Other Services       Precautions / Restrictions Precautions Precautions: Fall Restrictions Weight Bearing Restrictions: No Other Position/Activity Restrictions: WBAT       Mobility  Bed Mobility Overal bed mobility: Needs Assistance Bed Mobility: Supine to Sit     Supine to sit: Min assist;HOB elevated     General bed mobility comments: Min assist for LLE lifting and translation to EOB, increased time and effort to perform. Pt with use of bedrails. Pt reporting lightheadedness upon first sitting up, improved with sustained sitting.   Transfers Overall transfer level: Needs assistance Equipment used: Rolling walker (2 wheeled) Transfers: Sit to/from Stand Sit to Stand: Min assist;From elevated surface         General transfer comment: Min assist for power up, steadying upon standing. Verbal cuing for pushing up from bed  with UEs during power up.   Ambulation/Gait Ambulation/Gait assistance: Min guard Gait Distance (Feet): 50 Feet Assistive device: Rolling walker (2 wheeled) Gait Pattern/deviations: Step-to pattern;Decreased weight shift to left;Decreased step length - left;Trunk flexed;Antalgic Gait velocity: decr   General Gait Details: Min guard for safety. Verbal cuing for placement in RW, sequencing with step-to gait as pt attempting step-through but was unsteady with this, turning with RW, and upright posture. Pt with shaking of limbs, pt reports his legs "feel weak:[name]  Stairs            Wheelchair Mobility    Modified Rankin (Stroke Patients Only)       Balance Overall balance assessment: Mild deficits observed, not formally tested                                           Pertinent Vitals/Pain Pain Assessment: 0-10 Pain Score: 7  Pain Location: L hip  Pain Descriptors / Indicators: Sore Pain Intervention(s): Limited activity within patient's tolerance;RN gave pain meds during session;Monitored during session;Repositioned;Ice applied    Home Living Family/patient expects to be discharged to:: Private residence Living Arrangements: Spouse/significant other Available Help at Discharge: Family;Available PRN/intermittently Type of Home: House Home Access: Stairs to enter Entrance Stairs-Rails: Right;Left;Can reach both Entrance Stairs-Number of Steps: 4 Home Layout: One level Home Equipment: Walker - 2 wheels;Bedside commode      Prior Function Level of Independence: Independent with assistive device(s)   Gait / Transfers Assistance Needed: Pt reports using RW for past 4 months due  to severe L hip pain. Pt reports wife helped him on and off couch, as pt slept on the couch since November 2019.     Comments: Pt has not worked since October 2019     Hand Dominance   Dominant Hand: Right    Extremity/Trunk Assessment   Upper Extremity Assessment Upper  Extremity Assessment: Overall WFL for tasks assessed    Lower Extremity Assessment Lower Extremity Assessment: Overall WFL for tasks assessed;LLE deficits/detail LLE Deficits / Details: suspected post-surgical weakness; able to perform ankle pumps, quad sets, heel slide with assist LLE Sensation: WNL    Cervical / Trunk Assessment Cervical / Trunk Assessment: Normal  Communication   Communication: No difficulties  Cognition Arousal/Alertness: Awake/alert Behavior During Therapy: WFL for tasks assessed/performed Overall Cognitive Status: Within Functional Limits for tasks assessed                                        General Comments      Exercises     Assessment/Plan    PT Assessment Patient needs continued PT services  PT Problem List Decreased strength;Decreased mobility;Decreased range of motion;Decreased activity tolerance;Decreased balance;Decreased knowledge of use of DME;Pain       PT Treatment Interventions DME instruction;Functional mobility training;Balance training;Patient/family education;Gait training;Therapeutic activities;Stair training;Therapeutic exercise    PT Goals (Current goals can be found in the Care Plan section)  Acute Rehab PT Goals Patient Stated Goal: walk pain free, return to PLOF within 6 weeks PT Goal Formulation: With patient Time For Goal Achievement: 04/03/18 Potential to Achieve Goals: Good    Frequency 7X/week   Barriers to discharge        Co-evaluation               AM-PAC PT "6 Clicks" Mobility  Outcome Measure Help needed turning from your back to your side while in a flat bed without using bedrails?: A Little Help needed moving from lying on your back to sitting on the side of a flat bed without using bedrails?: A Little Help needed moving to and from a bed to a chair (including a wheelchair)?: A Little Help needed standing up from a chair using your arms (e.g., wheelchair or bedside chair)?: A  Little Help needed to walk in hospital room?: A Little Help needed climbing 3-5 steps with a railing? : A Lot 6 Click Score: 17    End of Session Equipment Utilized During Treatment: Gait belt;Oxygen(O2 reapplied after session) Activity Tolerance: Patient tolerated treatment well;Patient limited by fatigue Patient left: in chair;with chair alarm set;with family/visitor present;with SCD's reapplied(chair alarm beeping due to low battery, NT notified and replacing batteries) Nurse Communication: Mobility status PT Visit Diagnosis: Other abnormalities of gait and mobility (R26.89);Difficulty in walking, not elsewhere classified (R26.2)    Time: 7654-6503 PT Time Calculation (min) (ACUTE ONLY): 35 min   Charges:   PT Evaluation $PT Eval Low Complexity: 1 Low PT Treatments $Gait Training: 8-22 mins        Julien Girt, PT Acute Rehabilitation Services Pager [phone]  Office [phone]  Johnanna Bakke D Elonda Husky 03/27/2018, 5:25 PM

## 2018-03-27 NOTE — Transfer of Care (Signed)
Immediate Anesthesia Transfer of Care Note  Patient: Travis Huff  Procedure(s) Performed: Procedure(s): TOTAL HIP ARTHROPLASTY ANTERIOR APPROACH- DA complex (Left)  Patient Location: PACU  Anesthesia Type:General  Level of Consciousness:  sedated, patient cooperative and responds to stimulation  Airway & Oxygen Therapy:Patient Spontanous Breathing and Patient connected to face mask oxgen  Post-op Assessment:  Report given to PACU RN and Post -op Vital signs reviewed and stable  Post vital signs:  Reviewed and stable  Last Vitals:  Vitals:   03/27/18 0615  BP: (!) 130/93  Pulse: (!) 57  Resp: 14  Temp: 36.8 C  SpO2: 36%    Complications: No apparent anesthesia complications

## 2018-03-27 NOTE — Anesthesia Procedure Notes (Signed)
Procedure Name: Intubation Date/Time: 03/27/2018 7:40 AM Performed by: Lavina Hamman, CRNA Pre-anesthesia Checklist: Patient identified, Emergency Drugs available, Suction available, Patient being monitored and Timeout performed Patient Re-evaluated:Patient Re-evaluated prior to induction Oxygen Delivery Method: Circle system utilized Preoxygenation: Pre-oxygenation with 100% oxygen Induction Type: IV induction Ventilation: Mask ventilation without difficulty Laryngoscope Size: Mac and 4 Grade View: Grade I Tube type: Oral Tube size: 7.5 mm Number of attempts: 1 Airway Equipment and Method: Stylet Placement Confirmation: ETT inserted through vocal cords under direct vision,  positive ETCO2,  CO2 detector and breath sounds checked- equal and bilateral Secured at: 23 cm Tube secured with: Tape Dental Injury: Teeth and Oropharynx as per pre-operative assessment  Comments: ATOI

## 2018-03-27 NOTE — Anesthesia Postprocedure Evaluation (Signed)
Anesthesia Post Note  Patient: Travis Huff  Procedure(s) Performed: TOTAL HIP ARTHROPLASTY ANTERIOR APPROACH- DA complex (Left Hip)     Patient location during evaluation: PACU Anesthesia Type: MAC Level of consciousness: awake and alert Pain management: pain level controlled Vital Signs Assessment: post-procedure vital signs reviewed and stable Respiratory status: spontaneous breathing, nonlabored ventilation, respiratory function stable and patient connected to nasal cannula oxygen Cardiovascular status: stable and blood pressure returned to baseline Postop Assessment: no apparent nausea or vomiting Anesthetic complications: no    Last Vitals:  Vitals:   03/27/18 1525 03/27/18 1705  BP: 121/70 113/71  Pulse: 68 75  Resp: 16 16  Temp: 37.2 C 37.2 C  SpO2: 94% 97%    Last Pain:  Vitals:   03/27/18 1705  TempSrc: Oral  PainSc:                  Thomos Domine

## 2018-03-27 NOTE — Anesthesia Postprocedure Evaluation (Signed)
Anesthesia Post Note  Patient: ALLENMICHAEL MCPARTLIN  Procedure(s) Performed: TOTAL HIP ARTHROPLASTY ANTERIOR APPROACH- DA complex (Left Hip)     Patient location during evaluation: PACU Anesthesia Type: MAC and Spinal Level of consciousness: oriented and awake and alert Pain management: pain level controlled Vital Signs Assessment: post-procedure vital signs reviewed and stable Respiratory status: spontaneous breathing, respiratory function stable and patient connected to nasal cannula oxygen Cardiovascular status: blood pressure returned to baseline and stable Postop Assessment: no headache, no backache and no apparent nausea or vomiting Anesthetic complications: no    Last Vitals:  Vitals:   03/27/18 1525 03/27/18 1705  BP: 121/70 113/71  Pulse: 68 75  Resp: 16 16  Temp: 37.2 C 37.2 C  SpO2: 94% 97%    Last Pain:  Vitals:   03/27/18 1705  TempSrc: Oral  PainSc:                  Tyshon Fanning

## 2018-03-27 NOTE — Op Note (Signed)
OPERATIVE REPORT  SURGEON: Rod Can, MD   ASSISTANT: Nehemiah Massed, PA-C.  PREOPERATIVE DIAGNOSIS: Left hip avascular necrosis.   POSTOPERATIVE DIAGNOSIS: Left hip avascular necrosis.   PROCEDURE: Left total hip arthroplasty, anterior approach.   IMPLANTS: DePuy Tri Lock stem, size 7, hi offset. DePuy Pinnacle Cup, size 58 mm. DePuy Altrx liner, size 36 by 58 mm, neutral. DePuy Biolox ceramic head ball, size 36 + 1.5 mm. 6.5 mm cancellous bone screw x1.  ANESTHESIA:  General  ESTIMATED BLOOD LOSS:-1100 mL    ANTIBIOTICS: 3 g Ancef.  DRAINS: None.  COMPLICATIONS: None.   CONDITION: PACU - hemodynamically stable.   BRIEF CLINICAL NOTE: Travis Huff is a 58 y.o. male with a long-standing history of Left hip avascular necrosis with collapse and hip dislocation. After failing conservative management, the patient was indicated for total hip arthroplasty. The risks, benefits, and alternatives to the procedure were explained, and the patient elected to proceed.  PROCEDURE IN DETAIL: Surgical site was marked by myself in the pre-op holding area. Once inside the operating room, spinal anesthesia was obtained, and a foley catheter was inserted. The patient was then positioned on the Hana table. All bony prominences were well padded. The hip was prepped and draped in the normal sterile surgical fashion. A time-out was called verifying side and site of surgery. The patient received IV antibiotics within 60 minutes of beginning the procedure.  The direct anterior approach to the hip was performed through the Hueter interval. Lateral femoral circumflex vessels were treated with the Auqumantys. The anterior capsule was exposed and an inverted T capsulotomy was made.The capsule was adherent to the femoral neck and head remnant. The femoral neck cut was made to the level of the templated cut. A corkscrew was placed into the head and the head was removed. The femoral head was  found to be completely collapsed. The head was passed to the back table and was measured.  Acetabular exposure was achieved. The acetabulum was filled with scar tissue and bone/cartilage fragments, which were excised. The pulvinar and labrum were excised. Sequential reaming of the acetabulum was then performed up to a size 57 mm reamer, taking care to keep the center of rotation low and lateral. A 58 mm cup was then opened and impacted into place at approximately 40 degrees of abduction and 20 degrees of anteversion. I elected to augment the already acceptable fixation with a single 6.5 mm cancellous bone screw. The final polyethylene liner was impacted into place and acetabular osteophytes were removed.   I then gained femoral exposure taking care to protect the abductors and greater trochanter. This was performed using standard external rotation, extension, and adduction. The capsule was peeled off the inner aspect of the greater trochanter, taking care to preserve the short external rotators. A cookie cutter was used to enter the femoral canal, and then the femoral canal finder was placed. Sequential broaching was performed up to a size 7. Calcar planer was used on the femoral neck remnant. I placed a hi offset neck and a trial head ball. The hip was reduced. Leg lengths and offset were checked fluoroscopically. The hip was dislocated and trial components were removed. The final implants were placed, and the hip was reduced.  Fluoroscopy was used to confirm component position and leg lengths. At 90 degrees of external rotation and full extension, the hip was stable to an anterior directed force.  The wound was copiously irrigated with normal saline using pulse lavage. Marcaine solution  was injected into the periarticular soft tissue. The wound was closed in layers using #1 Vicryl and V-Loc for the fascia, 2-0 Vicryl for the subcutaneous fat, 2-0 Monocryl for the deep dermal layer, 3-0 running  Monocryl subcuticular stitch, and Dermabond for the skin. Once the glue was fully dried, an Aquacell Ag dressing was applied. The patient was transported to the recovery room in stable condition. Sponge, needle, and instrument counts were correct at the end of the case x2. The patient tolerated the procedure well and there were no known complications.  Please note that a surgical assistant was a medical necessity for this procedure to perform it in a safe and expeditious manner. Assistant was necessary to provide appropriate retraction of vital neurovascular structures, to prevent femoral fracture, and to allow for anatomic placement of the prosthesis.

## 2018-03-27 NOTE — Interval H&P Note (Signed)
History and Physical Interval Note:  03/27/2018 7:18 AM  Travis Huff  has presented today for surgery, with the diagnosis of Avascular necrosis left hip with collapse.  The various methods of treatment have been discussed with the patient and family. After consideration of risks, benefits and other options for treatment, the patient has consented to  Procedure(s): TOTAL HIP ARTHROPLASTY ANTERIOR APPROACH- DA complex (Left) as a surgical intervention.  The patient's history has been reviewed, patient examined, no change in status, stable for surgery.  I have reviewed the patient's chart and labs.  Questions were answered to the patient's satisfaction.     Hilton Cork Sapna Padron

## 2018-03-28 ENCOUNTER — Encounter (HOSPITAL_COMMUNITY): Payer: Self-pay | Admitting: Orthopedic Surgery

## 2018-03-28 ENCOUNTER — Other Ambulatory Visit: Payer: Self-pay | Admitting: Sports Medicine

## 2018-03-28 DIAGNOSIS — F411 Generalized anxiety disorder: Secondary | ICD-10-CM

## 2018-03-28 LAB — CBC
HCT: 33.2 % — ABNORMAL LOW (ref 39.0–52.0)
Hemoglobin: 10.3 g/dL — ABNORMAL LOW (ref 13.0–17.0)
MCH: 31 pg (ref 26.0–34.0)
MCHC: 31 g/dL (ref 30.0–36.0)
MCV: 100 fL (ref 80.0–100.0)
Platelets: 183 10*3/uL (ref 150–400)
RBC: 3.32 MIL/uL — ABNORMAL LOW (ref 4.22–5.81)
RDW: 12.9 % (ref 11.5–15.5)
WBC: 12.4 10*3/uL — ABNORMAL HIGH (ref 4.0–10.5)
nRBC: 0 % (ref 0.0–0.2)

## 2018-03-28 LAB — TYPE AND SCREEN
ABO/RH(D): O NEG
Antibody Screen: NEGATIVE
Unit division: 0
Unit division: 0
Weak D: POSITIVE

## 2018-03-28 LAB — BPAM RBC
Blood Product Expiration Date: 202003312359
Blood Product Expiration Date: 202004232359
Unit Type and Rh: 9500
Unit Type and Rh: 9500

## 2018-03-28 LAB — BASIC METABOLIC PANEL
Anion gap: 7 (ref 5–15)
BUN: 17 mg/dL (ref 6–20)
CO2: 25 mmol/L (ref 22–32)
Calcium: 8.1 mg/dL — ABNORMAL LOW (ref 8.9–10.3)
Chloride: 103 mmol/L (ref 98–111)
Creatinine, Ser: 0.95 mg/dL (ref 0.61–1.24)
GFR calc Af Amer: >60 mL/min (ref >60)
GFR calc non Af Amer: >60 mL/min (ref >60)
Glucose, Bld: 125 mg/dL — ABNORMAL HIGH (ref 70–99)
Potassium: 3.9 mmol/L (ref 3.5–5.1)
Sodium: 135 mmol/L (ref 135–145)

## 2018-03-28 MED ORDER — SENNA 8.6 MG PO TABS: 2.0000 | ORAL_TABLET | Freq: Every day | ORAL | 1 refills | Status: DC

## 2018-03-28 MED ORDER — APIXABAN 5 MG PO TABS: 5.0000 mg | ORAL_TABLET | Freq: Two times a day (BID) | ORAL | 3 refills | Status: DC

## 2018-03-28 MED ORDER — DOCUSATE SODIUM 100 MG PO CAPS: 100.0000 mg | ORAL_CAPSULE | Freq: Two times a day (BID) | ORAL | 1 refills | Status: DC

## 2018-03-28 MED ORDER — ONDANSETRON HCL 4 MG PO TABS: 4.0000 mg | ORAL_TABLET | Freq: Four times a day (QID) | ORAL | 0 refills | Status: DC | PRN

## 2018-03-28 MED ORDER — HYDROMORPHONE HCL 2 MG PO TABS: 2.0000 mg | ORAL_TABLET | ORAL | 0 refills | Status: DC | PRN

## 2018-03-28 MED ORDER — APIXABAN 2.5 MG PO TABS: 2.5000 mg | ORAL_TABLET | Freq: Two times a day (BID) | ORAL | 0 refills | Status: DC

## 2018-03-28 MED FILL — DOK 100 MG SOFTGEL: 100 | 50 days supply | Qty: 100 | Fill #0

## 2018-03-28 MED FILL — HYDROmorphone HCL 2 MG TABS: 2 | 3 days supply | Qty: 30 | Fill #0

## 2018-03-28 MED FILL — DULOXETINE HCL 60 MG CPEP: 60 | 90 days supply | Qty: 90 | Fill #0

## 2018-03-28 MED FILL — SENNA 8.6 MG TABS: 8.6 | 50 days supply | Qty: 100 | Fill #0

## 2018-03-28 MED FILL — ELIQUIS 2.5 MG TABLET: 2.5 | 2 days supply | Qty: 4 | Fill #0

## 2018-03-28 MED FILL — ONDANSETRON HCL 4 MG TABLET: 4 | 5 days supply | Qty: 20 | Fill #0

## 2018-03-28 NOTE — Progress Notes (Signed)
  mild to moderate Subjective:  Patient reports pain as mild.  Denies N/V/CP/SOB. Patient states he feels great, has less pain than preop, and he wants to go home.  Objective:   VITALS:   Vitals:   03/27/18 1705 03/27/18 2238 03/28/18 0200 03/28/18 0559  BP: 113/71 113/66 124/63 121/60  Pulse: 75 72 72 72  Resp: 16 14  16   Temp: 98.9 F (37.2 C) 98.7 F (37.1 C) 98.7 F (37.1 C) 98.2 F (36.8 C)  TempSrc: Oral Oral Oral Oral  SpO2: 97% 98% 100% 98%  Weight:      Height:        NAD ABD soft Sensation intact distally Intact pulses distally Dorsiflexion/Plantar flexion intact Incision: scant drainage Compartment soft   Lab Results  Component Value Date   WBC 12.4 (H) 03/28/2018   HGB 10.3 (L) 03/28/2018   HCT 33.2 (L) 03/28/2018   MCV 100.0 03/28/2018   PLT 183 03/28/2018   BMET    Component Value Date/Time   NA 135 03/28/2018 0337   K 3.9 03/28/2018 0337   CL 103 03/28/2018 0337   CO2 25 03/28/2018 0337   GLUCOSE 125 (H) 03/28/2018 0337   BUN 17 03/28/2018 0337   CREATININE 0.95 03/28/2018 0337   CREATININE 1.10 02/12/2018 1258   CREATININE 1.06 04/17/2017 1405   CALCIUM 8.1 (L) 03/28/2018 0337   GFRNONAA >60 03/28/2018 0337   GFRNONAA >60 02/12/2018 1258   GFRAA >60 03/28/2018 0337   GFRAA >60 02/12/2018 1258     Assessment/Plan: 1 Day Post-Op   Principal Problem:   Avascular necrosis of bone of left hip (HCC) Active Problems:   Avascular necrosis of hip, left (HCC)   WBAT with walker DVT ppx: Eliquis, SCDs, TEDS PO pain control PT/OT Dispo: D/C home with HEP, will Rx low dose eliquis for 48 hrs, then resume full dose, will retain IVC filter for 6 weeks   Hilton Cork Ala Kratz 03/28/2018, 9:18 AM   Rod Can, MD Cell: [phone] [facility]  [place] [address], [place], [place] [address] Phone: [phone] [email or web address] Facebook  [employer]

## 2018-03-28 NOTE — Progress Notes (Signed)
Physical Therapy Treatment Patient Details Name: Travis Huff MRN: [id number] DOB: 11/25/1960 Today's Date: 03/28/2018    History of Present Illness 58 yo male s/p L DA-THA due to AVN on 03/27/18. PMH includes DVT, lumbar fusion L2-L5 & L5-S1, tremor, scoliosis, anxiety, obesity, bilateral TKR, R THA.     PT Comments    POD # 1 am session Pt dressed and eager to D/C to home.  Assisted OOB to amb in hallway, practice stairs then Then returned to room to perform some TE's following HEP handout.  Instructed on proper tech, freq as well as use of ICE.   [name] Addressed all mobility questions, discussed appropriate activity, educated on use of ICE.  Pt ready for D/C to home.   Follow Up Recommendations  Follow surgeon's recommendation for DC plan and follow-up therapies;Supervision for mobility/OOB     Equipment Recommendations  None recommended by PT    Recommendations for Other Services       Precautions / Restrictions Precautions Precautions: Fall Restrictions Weight Bearing Restrictions: No Other Position/Activity Restrictions: WBAT     Mobility  Bed Mobility Overal bed mobility: Needs Assistance Bed Mobility: Supine to Sit              Transfers Overall transfer level: Needs assistance Equipment used: Rolling walker (2 wheeled) Transfers: Sit to/from Stand Sit to Stand: Min assist;From elevated surface            Ambulation/Gait Ambulation/Gait assistance: Min guard Gait Distance (Feet): 55 Feet Assistive device: Rolling walker (2 wheeled) Gait Pattern/deviations: Step-to pattern;Decreased weight shift to left;Decreased step length - left;Trunk flexed;Antalgic         Stairs Stairs: Yes Stairs assistance: Supervision;Min guard Stair Management: Two rails;Step to pattern;Forwards Number of Stairs: 2     Wheelchair Mobility    Modified Rankin (Stroke Patients Only)       Balance                                             Cognition Arousal/Alertness: Awake/alert Behavior During Therapy: WFL for tasks assessed/performed Overall Cognitive Status: Within Functional Limits for tasks assessed                                        Exercises   Total Hip Replacement TE's 10 reps ankle pumps 10 reps knee presses 10 reps heel slides 10 reps SAQ's 10 reps ABD Followed by ICE     General Comments        Pertinent Vitals/Pain Pain Assessment: 0-10 Pain Score: 3  Pain Location: L hip  Pain Descriptors / Indicators: Tender;Tingling Pain Intervention(s): Monitored during session;Repositioned    Home Living                      Prior Function            PT Goals (current goals can now be found in the care plan section) Progress towards PT goals: Progressing toward goals    Frequency    7X/week      PT Plan Current plan remains appropriate    Co-evaluation              AM-PAC PT "6 Clicks" Mobility   Outcome Measure  Help needed turning from your  back to your side while in a flat bed without using bedrails?: A Little Help needed moving from lying on your back to sitting on the side of a flat bed without using bedrails?: A Little Help needed moving to and from a bed to a chair (including a wheelchair)?: A Little Help needed standing up from a chair using your arms (e.g., wheelchair or bedside chair)?: A Little Help needed to walk in hospital room?: A Little Help needed climbing 3-5 steps with a railing? : A Little 6 Click Score: 18    End of Session Equipment Utilized During Treatment: Gait belt Activity Tolerance: Patient tolerated treatment well;Patient limited by fatigue Patient left: in chair;with chair alarm set;with family/visitor present;with SCD's reapplied Nurse Communication: (pt ready for D/C to home) PT Visit Diagnosis: Other abnormalities of gait and mobility (R26.89);Difficulty in walking, not elsewhere classified (R26.2)     Time:  3779-3968 PT Time Calculation (min) (ACUTE ONLY): 38 min  Charges:  $Gait Training: 8-22 mins $Therapeutic Exercise: 8-22 mins $Therapeutic Activity: 8-22 mins                     {Diamantina Edinger  PTA Acute  Rehabilitation Services Pager      [phone] Office      [phone]

## 2018-03-28 NOTE — Progress Notes (Signed)
Reviewed discharge instructions and medications. Patient verbalizes understanding and has no further questions.

## 2018-03-28 NOTE — Discharge Summary (Signed)
Physician Discharge Summary  Patient ID: Travis Huff MRN: [id number] DOB/AGE: April 24, 1960 58 y.o.  Admit date: 03/27/2018 Discharge date: 03/28/2018  Admission Diagnoses:  Avascular necrosis of bone of left hip [facility])  Discharge Diagnoses:  Principal Problem:   Avascular necrosis of bone of left hip ([place]) Active Problems:   Avascular necrosis of hip, left [facility])   Past Medical History:  Diagnosis Date  . Arthritis   . Asthma    as child  . Avascular necrosis of bone of left hip ([place])   . Back pain   . Blood clot in vein 06/2016   x 2 no bleeding disorders found  . Complication of anesthesia    ketamine hallucinations  . GERD (gastroesophageal reflux disease)   . Hypertension   . Viral pericarditis 20 yrs ago    Surgeries: Procedure(s): TOTAL HIP ARTHROPLASTY ANTERIOR APPROACH- DA complex on 03/27/2018   Consultants (if any):   Discharged Condition: Improved  Hospital Course: Travis Huff is an 58 y.o. male who was admitted 03/27/2018 with a diagnosis of Avascular necrosis of bone of left hip ([place]) and went to the operating room on 03/27/2018 and underwent the above named procedures.    Preoperative IVC filter was placed by IR.  He was given perioperative antibiotics:  Anti-infectives (From admission, onward)   Start     Dose/Rate Route Frequency Ordered Stop   03/27/18 1400  ceFAZolin (ANCEF) IVPB 2g/100 mL premix     2 g 200 mL/hr over 30 Minutes Intravenous Every 6 hours 03/27/18 1240 03/27/18 2209   03/27/18 0600  ceFAZolin (ANCEF) 3 g in dextrose 5 % 50 mL IVPB     3 g 100 mL/hr over 30 Minutes Intravenous 30 min pre-op 03/26/18 1150 03/27/18 0743    .  He was given sequential compression devices, early ambulation, and apixaban 2.5 mg PO BID for 2 days --> then resume full dose for DVT prophylaxis.  He benefited maximally from the hospital stay and there were no complications.    Recent vital signs:  Vitals:   03/28/18 0946 03/28/18 1303  BP: 126/72  112/72  Pulse: 88 73  Resp: 16 16  Temp: 99.1 F (37.3 C) 99.3 F (37.4 C)  SpO2: 97% 98%    Recent laboratory studies:  Lab Results  Component Value Date   HGB 10.3 (L) 03/28/2018   HGB 14.9 03/25/2018   HGB 15.1 02/12/2018   Lab Results  Component Value Date   WBC 12.4 (H) 03/28/2018   PLT 183 03/28/2018   Lab Results  Component Value Date   INR 1.0 03/26/2018   Lab Results  Component Value Date   NA 135 03/28/2018   K 3.9 03/28/2018   CL 103 03/28/2018   CO2 25 03/28/2018   BUN 17 03/28/2018   CREATININE 0.95 03/28/2018   GLUCOSE 125 (H) 03/28/2018    Discharge Medications:   Allergies as of 03/28/2018      Reactions   Ketamine    hallucinations   Morphine And Related Itching      Medication List    STOP taking these medications   cyclobenzaprine 10 MG tablet Commonly known as:  FLEXERIL     TAKE these medications   AMBULATORY NON FORMULARY MEDICATION Knee-high, medium compression, graduated compression stockings. Apply to lower extremities. Medium circ, long length   apixaban 2.5 MG Tabs tablet Commonly known as:  ELIQUIS Take 1 tablet (2.5 mg total) by mouth every 12 (twelve) hours. After completing 2.5  mg PO BID for 2 days, then resume 5 mg PO BID What changed:  You were already taking a medication with the same name, and this prescription was added. Make sure you understand how and when to take each.   apixaban 5 MG Tabs tablet Commonly known as:  Eliquis Take 1 tablet (5 mg total) by mouth 2 (two) times daily. Start taking on:  March 30, 2018 What changed:  These instructions start on March 30, 2018. If you are unsure what to do until then, ask your doctor or other care provider.   atenolol 100 MG tablet Commonly known as:  TENORMIN Take 1 tablet (100 mg total) by mouth daily.   calcium carbonate 500 MG chewable tablet Commonly known as:  TUMS - dosed in mg elemental calcium Chew 3-4 tablets by mouth 2 (two) times daily as needed  (stomach cramps).   clobetasol ointment 0.05 % Commonly known as:  TEMOVATE Apply 1 application topically 2 (two) times daily. and cover with cotton socks   clotrimazole-betamethasone cream Commonly known as:  LOTRISONE Apply 1 application topically 2 (two) times daily.   diazepam 5 MG tablet Commonly known as:  VALIUM Take 1 tablet (5 mg total) by mouth at bedtime as needed for muscle spasms. What changed:  when to take this   docusate sodium 100 MG capsule Commonly known as:  COLACE Take 1 capsule (100 mg total) by mouth 2 (two) times daily.   DULoxetine 60 MG capsule Commonly known as:  CYMBALTA Take 1 capsule (60 mg total) by mouth at bedtime.   HYDROmorphone 2 MG tablet Commonly known as:  DILAUDID Take 1-2 tablets (2-4 mg total) by mouth every 4 (four) hours as needed for severe pain. What changed:    medication strength  See the new instructions.   lisinopril-hydrochlorothiazide 20-25 MG tablet Commonly known as:  PRINZIDE,ZESTORETIC TAKE 1 TABLET BY MOUTH DAILY.   ondansetron 4 MG tablet Commonly known as:  ZOFRAN Take 1 tablet (4 mg total) by mouth every 6 (six) hours as needed for nausea.   OxyCONTIN 20 mg 12 hr tablet Generic drug:  oxyCODONE TAKE 1 TABLET BY MOUTH EVERY 12 HOURS What changed:  how much to take   senna 8.6 MG Tabs tablet Commonly known as:  SENOKOT Take 2 tablets (17.2 mg total) by mouth at bedtime.   sildenafil 100 MG tablet Commonly known as:  VIAGRA TAKE 1 TABLET BY MOUTH AS NEEDED FOR ERECTILE DYSFUNCTION *MAX DOSE IS 1 TABLET PER DAY* What changed:  See the new instructions.   terbinafine 250 MG tablet Commonly known as:  LAMISIL Take 1 tablet (250 mg total) by mouth daily.       Diagnostic Studies: Ir Ivc Filter Plmt / S&i /img Guid/mod Sed  Result Date: 03/26/2018 INDICATION: History of pulmonary embolism and recurrent lower extremity DVT, requiring lifelong anticoagulation, currently scheduled for left hip surgery and  as such, request made for placement of a IVC filter for temporary caval interruption purposes. EXAM: ULTRASOUND GUIDANCE FOR VASCULAR ACCESS IVC CATHETERIZATION AND VENOGRAM IVC FILTER INSERTION COMPARISON:  Chest CT-07/21/2016 MEDICATIONS: None. ANESTHESIA/SEDATION: Fentanyl 100 mcg IV; Versed 2 mg IV Sedation Time: 10 minutes; The patient was continuously monitored during the procedure by the interventional radiology nurse under my direct supervision. CONTRAST:  55 mL OMNIPAQUE IOHEXOL 300 MG/ML  SOLN FLUOROSCOPY TIME:  1 minute (413 mGy) COMPLICATIONS: None immediate PROCEDURE: Informed consent was obtained from the patient following explanation of the procedure, risks, benefits and alternatives.  The patient understands, agrees and consents for the procedure. All questions were addressed. A time out was performed prior to the initiation of the procedure. Maximal barrier sterile technique utilized including caps, mask, sterile gowns, sterile gloves, large sterile drape, hand hygiene, and Betadine prep. Under sterile condition and local anesthesia, right internal jugular venous access was performed with ultrasound. An ultrasound image was saved and sent to PACS. Over a guidewire, the IVC filter delivery sheath and inner dilator were advanced into the IVC just above the IVC bifurcation. Contrast injection was performed for an IVC venogram. Through the delivery sheath, a retrievable Denali IVC filter was deployed below the level of the renal veins and above the IVC bifurcation. Limited post deployment venacavagram was performed. The delivery sheath was removed and hemostasis was obtained with manual compression. A dressing was placed. The patient tolerated the procedure well without immediate post procedural complication. FINDINGS: The IVC is patent. No evidence of thrombus, stenosis, or occlusion. No variant venous anatomy. Successful placement of the IVC filter below the level of the renal veins. IMPRESSION:  Successful ultrasound and fluoroscopically guided placement of an infrarenal retrievable IVC filter via right jugular approach. PLAN: This IVC filter is potentially retrievable. The patient will be assessed for filter retrieval by Interventional Radiology in approximately 8-12 weeks. Further recommendations regarding filter retrieval, continued surveillance or declaration of device permanence, will be made at that time. Electronically Signed   By: Sandi Mariscal M.D.   On: 03/26/2018 10:05   Dg Pelvis Portable  Result Date: 03/27/2018 CLINICAL DATA:  Left hip replacement. EXAM: PORTABLE PELVIS 1-2 VIEWS COMPARISON:  Intraoperative radiograph 03/27/2018 FINDINGS: Post left hip arthroplasty with normal alignment of the orthopedic hardware. No fracture is seen. Prior right hip arthroplasty and partially visualized lower lumbosacral spine fusion. IMPRESSION: Post left hip arthroplasty without evidence of immediate complications. Electronically Signed   By: Fidela Salisbury M.D.   On: 03/27/2018 11:42   Dg C-arm 1-60 Min-no Report  Result Date: 03/27/2018 Fluoroscopy was utilized by the requesting physician.  No radiographic interpretation.   Dg Hip Operative Unilat W Or W/o Pelvis Left  Result Date: 03/27/2018 CLINICAL DATA:  Left hip replacement EXAM: OPERATIVE LEFT HIP WITH PELVIS COMPARISON:  01/08/2018 FLUOROSCOPY TIME:  Radiation Exposure Index (as provided by the fluoroscopic device): 3.02 mGy If the device does not provide the exposure index: Fluoroscopy Time:  24 seconds Number of Acquired Images:  3 FINDINGS: Initial image again demonstrates significant remodeling of the left femoral head and widening of the hip joint. Subsequent hip prosthesis is noted in satisfactory position. No acute abnormality noted. IMPRESSION: Right hip replacement as described. Electronically Signed   By: Inez Catalina M.D.   On: 03/27/2018 10:12    Disposition: Discharge disposition: 01-Home or Self  Care       Discharge Instructions    Call MD / Call 911   Complete by:  As directed    If you experience chest pain or shortness of breath, CALL 911 and be transported to the hospital emergency room.  If you develope a fever above 101 F, pus (white drainage) or increased drainage or redness at the wound, or calf pain, call your surgeon's office.   Constipation Prevention   Complete by:  As directed    Drink plenty of fluids.  Prune juice may be helpful.  You may use a stool softener, such as Colace (over the counter) 100 mg twice a day.  Use MiraLax (over the counter) for constipation  as needed.   Diet - low sodium heart healthy   Complete by:  As directed    Driving restrictions   Complete by:  As directed    No driving for 6 weeks   Increase activity slowly as tolerated   Complete by:  As directed    Lifting restrictions   Complete by:  As directed    No lifting for 6 weeks   TED hose   Complete by:  As directed    Use stockings (TED hose) for 2 weeks on both leg(s).  You may remove them at night for sleeping.      Follow-up Information    Tammey Deeg, Aaron Edelman, MD. Schedule an appointment as soon as possible for a visit in 2 weeks.   Specialty:  Orthopedic Surgery Why:  For wound re-check Contact information: [address] [employer] [place] [address] [phone]            Signed: Hilton Cork Akeyla Molden 03/28/2018, 3:51 PM

## 2018-03-29 ENCOUNTER — Ambulatory Visit: Payer: 59 | Admitting: Rheumatology

## 2018-03-29 ENCOUNTER — Encounter (HOSPITAL_COMMUNITY): Payer: Self-pay | Admitting: Orthopedic Surgery

## 2018-03-29 ENCOUNTER — Other Ambulatory Visit: Payer: Self-pay | Admitting: *Deleted

## 2018-03-29 NOTE — Addendum Note (Signed)
Addendum  created 03/29/18 1219 by Janeece Riggers, MD   Clinical Note Signed

## 2018-03-29 NOTE — Patient Outreach (Signed)
[facility]) Care Management  03/29/2018  Travis Huff [birth year] [id number]    Transition of care telephone call  Referral received:  03/29/2018 Initial outreach:  03/29/2018 Surgery/procedure date:   03/27/2018 Insurance: [facility]    Initial unsuccessful telephone call to patient's preferred number in order to complete transition of care assessment; no answer, left HIPAA compliant voicemail message requesting return call.   Objective: Per the electronic medical record, Travis Huff  was hospitalized at [facility] from 03/27/2018-3/26-2020 for Avascular necrosis of bone left hip . Comorbidities include:HTN, DVT, Scoliosis of thoraco-lumbar spine, degeneration of thoracic intervertebral disc and s/p total knee arthroplasty.  He was discharged to home on 03/28/2018 without the need for home health services or durable medical equipment per the discharge summary.    Plan: If no return call from patient by the end of business day today, this RNCM will route unsuccessful outreach letter with [facility] [employer] pamphlet and 24 hour Nurse Advice Line Magnet to [facility] [employer] clinical pool to be mailed to patient's home address. This RNCM will attempt another outreach within 4 business days.   Raina Mina, RN Care Management Coordinator [place] Office [phone]

## 2018-03-29 NOTE — Anesthesia Postprocedure Evaluation (Signed)
Anesthesia Post Note  Patient: Travis Huff  Procedure(s) Performed: TOTAL HIP ARTHROPLASTY ANTERIOR APPROACH- DA complex (Left Hip)     Patient location during evaluation: PACU Anesthesia Type: General Level of consciousness: awake and alert Pain management: pain level controlled Vital Signs Assessment: post-procedure vital signs reviewed and stable Respiratory status: spontaneous breathing, nonlabored ventilation, respiratory function stable and patient connected to nasal cannula oxygen Cardiovascular status: blood pressure returned to baseline and stable Postop Assessment: no apparent nausea or vomiting Anesthetic complications: no    Last Vitals:  Vitals:   03/28/18 0946 03/28/18 1303  BP: 126/72 112/72  Pulse: 88 73  Resp: 16 16  Temp: 37.3 C 37.4 C  SpO2: 97% 98%    Last Pain:  Vitals:   03/28/18 1303  TempSrc: Oral  PainSc:                  Jaritza Duignan

## 2018-04-01 ENCOUNTER — Encounter (HOSPITAL_COMMUNITY): Payer: Self-pay | Admitting: Orthopedic Surgery

## 2018-04-03 ENCOUNTER — Inpatient Hospital Stay: Admit: 2018-04-03 | Payer: 59 | Admitting: Orthopedic Surgery

## 2018-04-03 ENCOUNTER — Other Ambulatory Visit: Payer: Self-pay | Admitting: *Deleted

## 2018-04-03 SURGERY — ARTHROPLASTY, HIP, TOTAL, ANTERIOR APPROACH
Anesthesia: Spinal | Laterality: Left

## 2018-04-03 NOTE — Patient Outreach (Signed)
[facility]) Care Management  04/03/2018  Travis Huff November 05, 1960 [id number]   Transition of care telephone call  Referral received: 03/29/2018 Initial outreach:  03/29/2018 Surgery/procedure date:  03/27/2018 Insurance: [facility]    Initial unsuccessful telephone call to patient's preferred number in order to complete transition of care assessment; no answer, left HIPAA compliant voicemail message requesting return call.   Objective: Per the electronic medical record, Travis Huff  was hospitalized at [facility] from 03/27/2018-03/28/2018 for a Vascular necrosis of bone left hip. He was discharged to home on 03/28/2018 without the need for home health services or durable medical equipment per the discharge summary however ordered to wear compression stockings to the operative site.   Plan: This RNCM will attempt another outreach within 4 business days.  Raina Mina, RN Care Management Coordinator [place] Office [phone]

## 2018-04-03 NOTE — Patient Outreach (Addendum)
[facility]) Care Management  04/03/2018  Travis Huff 19-Dec-1960 [id number]    Transition of care call (pt/caregiver returned call and assessment was completed with no needs)  Referral received:  03/29/2018 Initial outreach:   03/29/2018 Surgery/Operation: 03/27/2018 Insurance: [place]   Subjective: Initial successful telephone call to patient's preferred number in order to complete transition of care assessment; 2 HIPAA identifiers verified. Explained purpose of call and completed transition of care assessment.  States he is doing well, denies post op problems, states surgical pain well managed with prescribed medications, tolerating  diet , denies bowel or bladder problems.  Spouse is assisting with his recovery.      Objective:  Mr. Leiker was hospitalized at [facility] from 03/27/2018-03/28/2018 for Avascular necrosis of bone left hip. Comorbidities include: HTN, DVT, osteoarthritis of left hip and s/p total knee arthroplasty. He was discharged to home on 03/28/2018 without the need for home health services or DME however instructed to wear compression stockings.   Assessment:  Patient voices good understanding of all discharge instructions.  See transition of care flowsheet for assessment details.   Plan:  Reviewed [place] [place] Active Health Management 2020 Wellness Requirements of: Completing the computerized Health Assessment and the Health Action Step with Active Health Management [facility]) by September 03 2018 AND have an annual physical between January 02, 2017 and July 03, 2018.  No ongoing care management needs identified so will close case to [place] Management care management services and route successful outreach letter with [place] Management pamphlet and 24 Hour Nurse Line Magnet to [place] Management clinical pool to be mailed to patient's home address.   Raina Mina, RN Care  Management Coordinator [place] Office [phone]

## 2018-04-09 ENCOUNTER — Ambulatory Visit: Payer: Self-pay | Admitting: *Deleted

## 2018-04-11 DIAGNOSIS — Z471 Aftercare following joint replacement surgery: Secondary | ICD-10-CM | POA: Diagnosis not present

## 2018-04-11 DIAGNOSIS — Z96642 Presence of left artificial hip joint: Secondary | ICD-10-CM | POA: Diagnosis not present

## 2018-04-17 ENCOUNTER — Other Ambulatory Visit: Payer: Self-pay | Admitting: Sports Medicine

## 2018-04-17 DIAGNOSIS — M48062 Spinal stenosis, lumbar region with neurogenic claudication: Secondary | ICD-10-CM

## 2018-04-17 MED FILL — ELIQUIS 5 MG TABLET: 5 | 30 days supply | Qty: 60 | Fill #2

## 2018-04-17 MED FILL — ATENOLOL 100 MG TAB: 100 | 30 days supply | Qty: 30 | Fill #1

## 2018-04-17 MED FILL — OxyCONTIN 20 MG T12A: 20 | 30 days supply | Qty: 60 | Fill #0

## 2018-05-14 DIAGNOSIS — Z96642 Presence of left artificial hip joint: Secondary | ICD-10-CM | POA: Diagnosis not present

## 2018-05-14 DIAGNOSIS — Z471 Aftercare following joint replacement surgery: Secondary | ICD-10-CM | POA: Diagnosis not present

## 2018-05-20 ENCOUNTER — Other Ambulatory Visit: Payer: Self-pay | Admitting: Sports Medicine

## 2018-05-20 DIAGNOSIS — I10 Essential (primary) hypertension: Secondary | ICD-10-CM

## 2018-05-20 DIAGNOSIS — F411 Generalized anxiety disorder: Secondary | ICD-10-CM

## 2018-05-20 MED FILL — TERBINAFINE HCL 250 MG TAB: 250 | 90 days supply | Qty: 90 | Fill #1

## 2018-05-20 MED FILL — LISINOPRIL-HCTZ 20-25 MG TA: 20-25 | 90 days supply | Qty: 90 | Fill #3

## 2018-05-20 MED FILL — ATENOLOL 100 MG TAB: 100 | 90 days supply | Qty: 90 | Fill #0

## 2018-05-28 ENCOUNTER — Inpatient Hospital Stay: Payer: 59 | Admitting: Hematology

## 2018-05-28 ENCOUNTER — Inpatient Hospital Stay: Payer: 59

## 2018-06-05 MED FILL — PRIMIDONE 50 MG TABLET: 50 | 30 days supply | Qty: 90 | Fill #1

## 2018-06-10 ENCOUNTER — Other Ambulatory Visit: Payer: Self-pay | Admitting: Sports Medicine

## 2018-06-10 DIAGNOSIS — I10 Essential (primary) hypertension: Secondary | ICD-10-CM

## 2018-06-17 DIAGNOSIS — M25512 Pain in left shoulder: Secondary | ICD-10-CM | POA: Diagnosis not present

## 2018-06-17 DIAGNOSIS — M12812 Other specific arthropathies, not elsewhere classified, left shoulder: Secondary | ICD-10-CM | POA: Insufficient documentation

## 2018-06-17 DIAGNOSIS — M25511 Pain in right shoulder: Secondary | ICD-10-CM | POA: Diagnosis not present

## 2018-06-17 DIAGNOSIS — M25812 Other specified joint disorders, left shoulder: Secondary | ICD-10-CM | POA: Diagnosis not present

## 2018-06-17 DIAGNOSIS — M19011 Primary osteoarthritis, right shoulder: Secondary | ICD-10-CM | POA: Insufficient documentation

## 2018-06-19 ENCOUNTER — Ambulatory Visit (INDEPENDENT_AMBULATORY_CARE_PROVIDER_SITE_OTHER): Payer: 59 | Admitting: Sports Medicine

## 2018-06-19 ENCOUNTER — Encounter: Payer: Self-pay | Admitting: Sports Medicine

## 2018-06-19 VITALS — BP 117/79 | HR 71 | Ht 72.0 in | Wt 315.0 lb

## 2018-06-19 DIAGNOSIS — Z0000 Encounter for general adult medical examination without abnormal findings: Secondary | ICD-10-CM

## 2018-06-19 DIAGNOSIS — B351 Tinea unguium: Secondary | ICD-10-CM | POA: Insufficient documentation

## 2018-06-19 DIAGNOSIS — F5101 Primary insomnia: Secondary | ICD-10-CM

## 2018-06-19 DIAGNOSIS — I10 Essential (primary) hypertension: Secondary | ICD-10-CM | POA: Diagnosis not present

## 2018-06-19 DIAGNOSIS — G4700 Insomnia, unspecified: Secondary | ICD-10-CM | POA: Insufficient documentation

## 2018-06-19 DIAGNOSIS — E782 Mixed hyperlipidemia: Secondary | ICD-10-CM | POA: Diagnosis not present

## 2018-06-19 DIAGNOSIS — Z96643 Presence of artificial hip joint, bilateral: Secondary | ICD-10-CM | POA: Diagnosis not present

## 2018-06-19 DIAGNOSIS — I82461 Acute embolism and thrombosis of right calf muscular vein: Secondary | ICD-10-CM

## 2018-06-19 DIAGNOSIS — I872 Venous insufficiency (chronic) (peripheral): Secondary | ICD-10-CM | POA: Diagnosis not present

## 2018-06-19 DIAGNOSIS — Z23 Encounter for immunization: Secondary | ICD-10-CM

## 2018-06-19 DIAGNOSIS — Z125 Encounter for screening for malignant neoplasm of prostate: Secondary | ICD-10-CM | POA: Diagnosis not present

## 2018-06-19 MED ORDER — APIXABAN 2.5 MG PO TABS: 2.5000 mg | ORAL_TABLET | Freq: Two times a day (BID) | ORAL | 3 refills | Status: DC

## 2018-06-19 MED ORDER — ZOLPIDEM TARTRATE 5 MG PO TABS: 5.0000 mg | ORAL_TABLET | Freq: Every evening | ORAL | 3 refills | Status: DC | PRN

## 2018-06-19 MED ORDER — AMBULATORY NON FORMULARY MEDICATION: 0 refills | Status: DC

## 2018-06-19 MED FILL — ELIQUIS 2.5 MG TABLET: 2.5 | 90 days supply | Qty: 180 | Fill #0

## 2018-06-19 MED FILL — ZOLPIDEM TARTRATE 5 MG TAB: 5 | 30 days supply | Qty: 30 | Fill #0

## 2018-06-19 NOTE — Assessment & Plan Note (Signed)
Doing well 

## 2018-06-19 NOTE — Assessment & Plan Note (Signed)
Adequate sleep hygiene with increased sleep latency. Adding low-dose Ambien.

## 2018-06-19 NOTE — Progress Notes (Signed)
Subjective:    CC: Annual physical exam  HPI:  Travis Huff is a pleasant 58 year old male here for his physical, he has several complaints.  Hip avascular necrosis: He is now post bilateral hip arthroplasty doing well.  Hypertension: Well-controlled.  Hyperlipidemia: Due to recheck lipids.  Preventive measures: Due for labs, Shingrix vaccine.  Insomnia: Tends to go to bed somewhat late, his sleep regimen and hygiene is good, does not tend to drink beverages or look at bright screens before bedtime, he will lay in the bed approximately 1 hour with a 1 hour sleep latency before falling asleep, at that point he is able to stay asleep and wakes well rested.  He did try some of his wife's Ambien, and this improved his sleep latency and would like some of his own.  DVT: This is a second, he will be on lifelong anticoagulation, he has finished 6 months of full dose Eliquis, agreeable to drop down to 2.5 mg twice daily.  He has not been consistent with wearing his compression hose, he would be agreeable to try zippered compression stockings as these would be easier to get on.  Onychomycosis: Partial improvement after 3 months of oral Lamisil.  I reviewed the past medical history, family history, social history, surgical history, and allergies today and no changes were needed.  Please see the problem list section below in epic for further details.  Past Medical History: Past Medical History:  Diagnosis Date  . Arthritis   . Asthma    as child  . Avascular necrosis of bone of left hip ([facility])   . Back pain   . Blood clot in vein 06/2016   x 2 no bleeding disorders found  . Complication of anesthesia    ketamine hallucinations  . GERD (gastroesophageal reflux disease)   . Hypertension   . Viral pericarditis 20 yrs ago   Past Surgical History: Past Surgical History:  Procedure Laterality Date  . ABDOMINAL EXPOSURE N/A 06/23/2016   Procedure: ABDOMINAL EXPOSURE;  Surgeon: Rosetta Posner, MD;   Location: [place];  Service: Vascular;  Laterality: N/A;  . ANTERIOR LATERAL LUMBAR FUSION 4 LEVELS Right 06/23/2016   Procedure: Right  Lumbar two-three Lumbar three-four Lumbar four-five Anterolateral lumbar interbody fusion;  Surgeon: Erline Levine, MD;  Location: [place];  Service: Neurosurgery;  Laterality: Right;  . ANTERIOR LUMBAR FUSION N/A 06/23/2016   Procedure: Lumbar five -sacral one  Anterior lumbar interbody fusion with Dr. Sherren Mocha Early for approach;  Surgeon: Erline Levine, MD;  Location: [place];  Service: Neurosurgery;  Laterality: N/A;  . APPLICATION OF INTRAOPERATIVE CT SCAN N/A 06/26/2016   Procedure: APPLICATION OF INTRAOPERATIVE CT SCAN;  Surgeon: Erline Levine, MD;  Location: [place];  Service: Neurosurgery;  Laterality: N/A;  . CARDIOVASCULAR STRESS TEST     Negative in May 2014  . COLONOSCOPY    . HERNIA REPAIR     umbilical  . IR IVC FILTER PLMT / S&I Burke Keels GUID/MOD SED  03/26/2018  . JOINT REPLACEMENT    . POSTERIOR LUMBAR FUSION 4 LEVEL N/A 06/26/2016   Procedure: Thoracic ten to Pelvis fixation with AIRO;  Surgeon: Erline Levine, MD;  Location: [place];  Service: Neurosurgery;  Laterality: N/A;  . TENDON REPAIR     right hand  . TOTAL HIP ARTHROPLASTY Right 05/25/2014   Procedure: RIGHT TOTAL HIP ARTHROPLASTY ANTERIOR APPROACH;  Surgeon: Rod Can, MD;  Location: [place];  Service: Orthopedics;  Laterality: Right;  . TOTAL HIP ARTHROPLASTY Left 03/27/2018  Procedure: TOTAL HIP ARTHROPLASTY ANTERIOR APPROACH- DA complex;  Surgeon: Rod Can, MD;  Location: [facility];  Service: Orthopedics;  Laterality: Left;  . TOTAL KNEE ARTHROPLASTY Bilateral    Social History: Social History   Socioeconomic History  . Marital status: Married    Spouse name: Not on file  . Number of children: 2  . Years of education: 68  . Highest education level: Not on file  Occupational History  . Occupation: Public relations account executive  Social Needs  . Financial resource strain: Not on file  . Food  insecurity    Worry: Not on file    Inability: Not on file  . Transportation needs    Medical: Not on file    Non-medical: Not on file  Tobacco Use  . Smoking status: Former Smoker    Packs/day: 1.00    Years: 25.00    Pack years: 25.00    Types: Cigarettes    Quit date: 2000    Years since quitting: 20.4  . Smokeless tobacco: Current User    Types: Snuff  Substance and Sexual Activity  . Alcohol use: Yes    Alcohol/week: 14.0 standard drinks    Types: 14 Cans of beer per week    Comment: 2 beers QD  . Drug use: No  . Sexual activity: Not on file  Lifestyle  . Physical activity    Days per week: Not on file    Minutes per session: Not on file  . Stress: Not on file  Relationships  . Social Herbalist on phone: Not on file    Gets together: Not on file    Attends religious service: Not on file    Active member of club or organization: Not on file    Attends meetings of clubs or organizations: Not on file    Relationship status: Not on file  Other Topics Concern  . Not on file  Social History Narrative   Lives with wife in a one story home.  Has 2 children.  Works as a Public relations account executive.  Education: high school.    Family History: Family History  Adopted: Yes  Problem Relation Age of Onset  . Healthy Daughter    Allergies: Allergies  Allergen Reactions  . Ketamine     hallucinations  . Morphine And Related Itching   Medications: See med rec.  Review of Systems: No headache, visual changes, nausea, vomiting, diarrhea, constipation, dizziness, abdominal pain, skin rash, fevers, chills, night sweats, swollen lymph nodes, weight loss, chest pain, body aches, joint swelling, muscle aches, shortness of breath, mood changes, visual or auditory hallucinations.  Objective:    General: Well Developed, well nourished, and in no acute distress.  Neuro: Alert and oriented x3, extra-ocular muscles intact, sensation grossly intact. Cranial nerves II through XII are  intact, motor, sensory, and coordinative functions are all intact. HEENT: Normocephalic, atraumatic, pupils equal round reactive to light, neck supple, no masses, no lymphadenopathy, thyroid nonpalpable. Oropharynx, nasopharynx, external ear canals are unremarkable. Skin: Warm and dry, no rashes noted.  Cardiac: Regular rate and rhythm, no murmurs rubs or gallops.  Respiratory: Clear to auscultation bilaterally. Not using accessory muscles, speaking in full sentences.  Abdominal: Soft, nontender, nondistended, positive bowel sounds, no masses, no organomegaly.  Musculoskeletal: Shoulder, elbow, wrist, hip, knee, ankle stable, and with full range of motion.  Impression and Recommendations:    The patient was counselled, risk factors were discussed, anticipatory guidance given.  Annual physical exam Annual  physical as above, checking routine labs. Shingrix today, repeat in 2 months.  Acute deep vein thrombosis (DVT) of calf muscle vein of right lower extremity Has finished 6 months of treatment dose, dropping to 2.5 mg of Eliquis twice daily  History of bilateral hip arthroplasty Doing well.  Benign essential hypertension Blood pressure has improved.   No changes.  Onychomycosis Needs to continue the full 6 to 9 months of Lamisil.  Venous stasis dermatitis of both lower extremities Has difficulty with compression hose. Adding bilateral zippered compression stockings.  Insomnia Adequate sleep hygiene with increased sleep latency. Adding low-dose Ambien.   ___________________________________________ Gwen Her. Dianah Field, M.D., ABFM., CAQSM. Primary Care and Sports Medicine [place] [facility]  Adjunct Professor of [place] [employer] [facility] [employer]

## 2018-06-19 NOTE — Assessment & Plan Note (Signed)
Has finished 6 months of treatment dose, dropping to 2.5 mg of Eliquis twice daily

## 2018-06-19 NOTE — Assessment & Plan Note (Signed)
Has difficulty with compression hose. Adding bilateral zippered compression stockings.

## 2018-06-19 NOTE — Assessment & Plan Note (Signed)
Needs to continue the full 6 to 9 months of Lamisil.

## 2018-06-19 NOTE — Assessment & Plan Note (Signed)
Blood pressure has improved.   No changes.

## 2018-06-19 NOTE — Assessment & Plan Note (Signed)
Annual physical as above, checking routine labs. Shingrix today, repeat in 2 months.

## 2018-06-20 ENCOUNTER — Emergency Department (HOSPITAL_COMMUNITY): Payer: 59

## 2018-06-20 ENCOUNTER — Ambulatory Visit (INDEPENDENT_AMBULATORY_CARE_PROVIDER_SITE_OTHER): Payer: 59 | Admitting: Physician Assistant

## 2018-06-20 ENCOUNTER — Encounter (HOSPITAL_COMMUNITY): Payer: Self-pay | Admitting: Emergency Medicine

## 2018-06-20 ENCOUNTER — Inpatient Hospital Stay (HOSPITAL_COMMUNITY)
Admission: EM | Admit: 2018-06-20 | Discharge: 2018-06-22 | DRG: 280 | Disposition: A | Discharge status: Home / Self Care | Payer: 59 | Attending: Cardiology | Admitting: Cardiology

## 2018-06-20 ENCOUNTER — Telehealth: Payer: Self-pay | Admitting: Internal Medicine

## 2018-06-20 ENCOUNTER — Other Ambulatory Visit: Payer: Self-pay

## 2018-06-20 ENCOUNTER — Other Ambulatory Visit: Payer: Self-pay | Admitting: Neurology

## 2018-06-20 ENCOUNTER — Inpatient Hospital Stay (HOSPITAL_COMMUNITY): Payer: 59

## 2018-06-20 ENCOUNTER — Encounter: Payer: Self-pay | Admitting: Physician Assistant

## 2018-06-20 ENCOUNTER — Other Ambulatory Visit: Payer: Self-pay | Admitting: Sports Medicine

## 2018-06-20 VITALS — BP 142/91 | HR 98 | Temp 98.3°F | Ht 72.0 in | Wt 315.0 lb

## 2018-06-20 DIAGNOSIS — G8929 Other chronic pain: Secondary | ICD-10-CM | POA: Diagnosis present

## 2018-06-20 DIAGNOSIS — R000 Tachycardia, unspecified: Secondary | ICD-10-CM

## 2018-06-20 DIAGNOSIS — Z7901 Long term (current) use of anticoagulants: Secondary | ICD-10-CM

## 2018-06-20 DIAGNOSIS — L749 Eccrine sweat disorder, unspecified: Secondary | ICD-10-CM

## 2018-06-20 DIAGNOSIS — Z96643 Presence of artificial hip joint, bilateral: Secondary | ICD-10-CM | POA: Diagnosis present

## 2018-06-20 DIAGNOSIS — Z86711 Personal history of pulmonary embolism: Secondary | ICD-10-CM

## 2018-06-20 DIAGNOSIS — J45909 Unspecified asthma, uncomplicated: Secondary | ICD-10-CM | POA: Diagnosis present

## 2018-06-20 DIAGNOSIS — R079 Chest pain, unspecified: Secondary | ICD-10-CM | POA: Diagnosis not present

## 2018-06-20 DIAGNOSIS — F419 Anxiety disorder, unspecified: Secondary | ICD-10-CM | POA: Diagnosis present

## 2018-06-20 DIAGNOSIS — R55 Syncope and collapse: Secondary | ICD-10-CM | POA: Diagnosis not present

## 2018-06-20 DIAGNOSIS — I10 Essential (primary) hypertension: Secondary | ICD-10-CM | POA: Diagnosis present

## 2018-06-20 DIAGNOSIS — E66813 Obesity, class 3: Secondary | ICD-10-CM | POA: Diagnosis present

## 2018-06-20 DIAGNOSIS — R0602 Shortness of breath: Secondary | ICD-10-CM

## 2018-06-20 DIAGNOSIS — I82461 Acute embolism and thrombosis of right calf muscular vein: Secondary | ICD-10-CM

## 2018-06-20 DIAGNOSIS — R7989 Other specified abnormal findings of blood chemistry: Secondary | ICD-10-CM | POA: Diagnosis not present

## 2018-06-20 DIAGNOSIS — Z6841 Body Mass Index (BMI) 40.0 and over, adult: Secondary | ICD-10-CM

## 2018-06-20 DIAGNOSIS — K219 Gastro-esophageal reflux disease without esophagitis: Secondary | ICD-10-CM | POA: Diagnosis present

## 2018-06-20 DIAGNOSIS — Z86718 Personal history of other venous thrombosis and embolism: Secondary | ICD-10-CM | POA: Diagnosis not present

## 2018-06-20 DIAGNOSIS — Z981 Arthrodesis status: Secondary | ICD-10-CM | POA: Diagnosis not present

## 2018-06-20 DIAGNOSIS — I2510 Atherosclerotic heart disease of native coronary artery without angina pectoris: Secondary | ICD-10-CM | POA: Diagnosis present

## 2018-06-20 DIAGNOSIS — G4700 Insomnia, unspecified: Secondary | ICD-10-CM | POA: Diagnosis present

## 2018-06-20 DIAGNOSIS — Z87891 Personal history of nicotine dependence: Secondary | ICD-10-CM | POA: Diagnosis not present

## 2018-06-20 DIAGNOSIS — I214 Non-ST elevation (NSTEMI) myocardial infarction: Principal | ICD-10-CM | POA: Diagnosis present

## 2018-06-20 DIAGNOSIS — I371 Nonrheumatic pulmonary valve insufficiency: Secondary | ICD-10-CM | POA: Diagnosis not present

## 2018-06-20 DIAGNOSIS — Z95828 Presence of other vascular implants and grafts: Secondary | ICD-10-CM | POA: Diagnosis not present

## 2018-06-20 DIAGNOSIS — M419 Scoliosis, unspecified: Secondary | ICD-10-CM | POA: Diagnosis present

## 2018-06-20 DIAGNOSIS — Z96653 Presence of artificial knee joint, bilateral: Secondary | ICD-10-CM | POA: Diagnosis present

## 2018-06-20 DIAGNOSIS — I2542 Coronary artery dissection: Secondary | ICD-10-CM | POA: Diagnosis not present

## 2018-06-20 DIAGNOSIS — Z20828 Contact with and (suspected) exposure to other viral communicable diseases: Secondary | ICD-10-CM | POA: Diagnosis present

## 2018-06-20 DIAGNOSIS — R05 Cough: Secondary | ICD-10-CM | POA: Diagnosis not present

## 2018-06-20 DIAGNOSIS — Z885 Allergy status to narcotic agent status: Secondary | ICD-10-CM | POA: Diagnosis not present

## 2018-06-20 DIAGNOSIS — R0989 Other specified symptoms and signs involving the circulatory and respiratory systems: Secondary | ICD-10-CM | POA: Diagnosis not present

## 2018-06-20 DIAGNOSIS — I493 Ventricular premature depolarization: Secondary | ICD-10-CM | POA: Diagnosis present

## 2018-06-20 DIAGNOSIS — G629 Polyneuropathy, unspecified: Secondary | ICD-10-CM | POA: Diagnosis present

## 2018-06-20 DIAGNOSIS — R778 Other specified abnormalities of plasma proteins: Secondary | ICD-10-CM

## 2018-06-20 DIAGNOSIS — R110 Nausea: Secondary | ICD-10-CM | POA: Insufficient documentation

## 2018-06-20 DIAGNOSIS — I255 Ischemic cardiomyopathy: Secondary | ICD-10-CM

## 2018-06-20 DIAGNOSIS — M549 Dorsalgia, unspecified: Secondary | ICD-10-CM | POA: Diagnosis present

## 2018-06-20 DIAGNOSIS — E782 Mixed hyperlipidemia: Secondary | ICD-10-CM | POA: Diagnosis present

## 2018-06-20 LAB — CBC WITH DIFFERENTIAL/PLATELET
Abs Immature Granulocytes: 0.08 10*3/uL — ABNORMAL HIGH (ref 0.00–0.07)
Basophils Absolute: 0.1 10*3/uL (ref 0.0–0.1)
Basophils Relative: 1 %
Eosinophils Absolute: 0.1 10*3/uL (ref 0.0–0.5)
Eosinophils Relative: 1 %
HCT: 45.4 % (ref 39.0–52.0)
Hemoglobin: 14.1 g/dL (ref 13.0–17.0)
Immature Granulocytes: 1 %
Lymphocytes Relative: 14 %
Lymphs Abs: 1.8 10*3/uL (ref 0.7–4.0)
MCH: 28 pg (ref 26.0–34.0)
MCHC: 31.1 g/dL (ref 30.0–36.0)
MCV: 90.1 fL (ref 80.0–100.0)
Monocytes Absolute: 1.2 10*3/uL — ABNORMAL HIGH (ref 0.1–1.0)
Monocytes Relative: 10 %
Neutro Abs: 9.3 10*3/uL — ABNORMAL HIGH (ref 1.7–7.7)
Neutrophils Relative %: 73 %
Platelets: 310 10*3/uL (ref 150–400)
RBC: 5.04 MIL/uL (ref 4.22–5.81)
RDW: 15.3 % (ref 11.5–15.5)
WBC: 12.5 10*3/uL — ABNORMAL HIGH (ref 4.0–10.5)
nRBC: 0 % (ref 0.0–0.2)

## 2018-06-20 LAB — TROPONIN I: Troponin I: 1.41 ng/mL (ref <=0.0)

## 2018-06-20 MED ORDER — DULOXETINE HCL 60 MG PO CPEP
60.0000 mg | ORAL_CAPSULE | Freq: Every day | ORAL | Status: DC
  Administered 2018-06-21 (×2): 60 mg via ORAL
  Filled 2018-06-20 (×2): qty 1

## 2018-06-20 MED ORDER — CARVEDILOL 12.5 MG PO TABS: 12.5000 mg | ORAL_TABLET | Freq: Two times a day (BID) | ORAL | Status: DC

## 2018-06-20 MED ORDER — ZOLPIDEM TARTRATE 5 MG PO TABS
5.0000 mg | ORAL_TABLET | Freq: Every evening | ORAL | Status: DC | PRN
  Administered 2018-06-21: 5 mg via ORAL
  Filled 2018-06-20: qty 1

## 2018-06-20 MED ORDER — SENNA 8.6 MG PO TABS
2.0000 | ORAL_TABLET | Freq: Every day | ORAL | Status: DC
  Administered 2018-06-21 (×2): 17.2 mg via ORAL
  Filled 2018-06-20 (×2): qty 2

## 2018-06-20 MED ORDER — IOHEXOL 350 MG/ML SOLN
80.0000 mL | Freq: Once | INTRAVENOUS | Status: AC | PRN
  Administered 2018-06-20: 80 mL via INTRAVENOUS

## 2018-06-20 MED ORDER — LORAZEPAM 2 MG/ML IJ SOLN
1.0000 mg | Freq: Once | INTRAMUSCULAR | Status: AC
  Administered 2018-06-20: 1 mg via INTRAVENOUS
  Filled 2018-06-20: qty 1

## 2018-06-20 MED ORDER — ASPIRIN 325 MG PO TABS
325.0000 mg | ORAL_TABLET | Freq: Once | ORAL | Status: AC
  Administered 2018-06-20: 325 mg via ORAL
  Filled 2018-06-20: qty 1

## 2018-06-20 MED ORDER — NITROGLYCERIN 0.4 MG SL SUBL: 0.4000 mg | SUBLINGUAL_TABLET | SUBLINGUAL | Status: DC | PRN

## 2018-06-20 MED ORDER — HEPARIN (PORCINE) 25000 UT/250ML-% IV SOLN
1500.0000 [IU]/h | INTRAVENOUS | Status: DC
  Filled 2018-06-20: qty 250

## 2018-06-20 MED ORDER — DIAZEPAM 5 MG PO TABS
5.0000 mg | ORAL_TABLET | Freq: Every evening | ORAL | Status: DC | PRN
  Administered 2018-06-21: 5 mg via ORAL
  Filled 2018-06-20: qty 1

## 2018-06-20 MED ORDER — CALCIUM CARBONATE ANTACID 500 MG PO CHEW: 3.0000 | CHEWABLE_TABLET | Freq: Two times a day (BID) | ORAL | Status: DC | PRN

## 2018-06-20 MED ORDER — LISINOPRIL-HYDROCHLOROTHIAZIDE 20-25 MG PO TABS: 1.0000 | ORAL_TABLET | Freq: Every day | ORAL | Status: DC

## 2018-06-20 MED ORDER — ONDANSETRON HCL 4 MG/2ML IJ SOLN: 4.0000 mg | Freq: Four times a day (QID) | INTRAMUSCULAR | Status: DC | PRN

## 2018-06-20 MED ORDER — ATORVASTATIN CALCIUM 80 MG PO TABS
80.0000 mg | ORAL_TABLET | Freq: Every day | ORAL | Status: DC
  Administered 2018-06-21 (×2): 80 mg via ORAL
  Filled 2018-06-20 (×2): qty 1

## 2018-06-20 MED ORDER — ASPIRIN EC 81 MG PO TBEC
81.0000 mg | DELAYED_RELEASE_TABLET | Freq: Every day | ORAL | Status: DC
  Administered 2018-06-21 – 2018-06-22 (×2): 81 mg via ORAL
  Filled 2018-06-20 (×2): qty 1

## 2018-06-20 MED ORDER — ACETAMINOPHEN 325 MG PO TABS: 650.0000 mg | ORAL_TABLET | ORAL | Status: DC | PRN

## 2018-06-20 MED ORDER — DOCUSATE SODIUM 100 MG PO CAPS
100.0000 mg | ORAL_CAPSULE | Freq: Two times a day (BID) | ORAL | Status: DC
  Administered 2018-06-21 – 2018-06-22 (×4): 100 mg via ORAL
  Filled 2018-06-20 (×4): qty 1

## 2018-06-20 MED ORDER — METOPROLOL SUCCINATE ER 100 MG PO TB24: 100.0000 mg | ORAL_TABLET | Freq: Every day | ORAL | Status: DC

## 2018-06-20 MED ORDER — METOPROLOL SUCCINATE ER 100 MG PO TB24
100.0000 mg | ORAL_TABLET | Freq: Every day | ORAL | Status: DC
  Administered 2018-06-21 – 2018-06-22 (×2): 100 mg via ORAL
  Filled 2018-06-20 (×2): qty 1

## 2018-06-20 MED FILL — SILDENAFIL CITRATE 100 MG T: 100 | 30 days supply | Qty: 6 | Fill #0

## 2018-06-20 NOTE — Progress Notes (Signed)
ANTICOAGULATION CONSULT NOTE - Initial Consult  Pharmacy Consult for heparin Indication: chest pain/ACS  Allergies  Allergen Reactions  . Ketamine     hallucinations  . Morphine And Related Itching    Patient Measurements: Height: 6' (182.9 cm) Weight: 300 lb (136.1 kg) IBW/kg (Calculated) : 77.6 Heparin Dosing Weight: 108kg  Vital Signs: Temp: 98.3 F (36.8 C) (06/18 1553) Temp Source: Oral (06/18 1553) BP: 142/91 (06/18 1553) Pulse Rate: 98 (06/18 1553)  Labs: Recent Labs    06/19/18 1109 06/20/18 1644  HGB 13.5  --   HCT 41.3  --   PLT 303  --   CREATININE 0.97  --   TROPONINI  --  1.41*    Estimated Creatinine Clearance: 120 mL/min (by C-G formula based on SCr of 0.97 mg/dL).   Medical History: Past Medical History:  Diagnosis Date  . Arthritis   . Asthma    as child  . Avascular necrosis of bone of left hip ([place])   . Back pain   . Blood clot in vein 06/2016   x 2 no bleeding disorders found  . Complication of anesthesia    ketamine hallucinations  . GERD (gastroesophageal reflux disease)   . Hypertension   . Viral pericarditis 20 yrs ago    Medications:  Infusions:  . [START ON 06/21/2018] heparin      Assessment: 76 yom presented to the ED from his PCP office with an elevated troponin. To start IV heparin. However, he is on apixaban PTA with last dose 1.5 hours ago. H/H and platelets are WNL. No bleeding noted.   Will plan to start heparin approximately 12 hours after last apixaban dose and monitor aPTT's along with heparin levels due to apixaban's effect on heparin levels.   Goal of Therapy:  Heparin level 0.3-0.7 units/ml aPTT 66-102 seconds Monitor platelets by anticoagulation protocol: Yes   Plan:  Heparin gtt 1500 units/hr starting tomorrow at 0900 Check an 8 hour heparin level and aPTT Daily heparin level, aPTT and CBC  Travis Huff, Travis Huff 06/20/2018,10:19 PM

## 2018-06-20 NOTE — Telephone Encounter (Signed)
I received an urgent rhythm strip review request from [facility] family medicine Jade Breeback PA-C.  Mr. Travis Huff presented to family medicine clinic with presyncope and palpitations as well as significant diaphoresis.  An urgent telehealth appointment was made with my colleague Dr. Buford Dresser for 830 tomorrow morning by the family medicine office.  He was last seen in our clinic in 2014.  I was able to get in touch with Ms. Alden Hipp at 4:59 PM upon receiving the rhythm strips from triage.  Rhythm strips demonstrate sinus tachycardia, with rare PVC and PAC.  Baseline artifact makes ST deviation challenging to interpret, no definite ST deviation noted.  He is known to have a history of DVT.  He is on apixaban 5 mg twice daily and is on atenolol 100 mg daily with no change in his doses in the recent past.  He has been informed by his family medicine office of the red flag symptoms to present to the emergency department and he understands this per Ms. Breeback's report.  I will communicate with Dr. Harrell Gave regarding this conversation and the rhythm strips for her review as well.

## 2018-06-20 NOTE — Patient Instructions (Signed)
Virtual Visit with Cardiology Dr. Harrell Gave - 8:30 am. Their nurse will call you prior to visit with instructions.

## 2018-06-20 NOTE — Progress Notes (Signed)
Subjective:    Patient ID: Travis Huff, male    DOB: 07-Jul-1960, 58 y.o.   MRN: [id number]  HPI  Pt is a 58 yo male with HTN, recurrent DVT, HLD who comes into the clinic after a near syncopal episode this morning. He admits he has had similar "feelings" multiple times over the past 6 months but they seem to be happening more frequently. He went to bathroom this morning and started having "tunnel vision" and then fell to his knees and his body fell into the shower door. No LOC. He was very cold and clammy.  He got up and dry heaved a few times and then started to feel better. In past episodes he would start to cough. He is on metoprolol and eliquis. He reports he sweats a lot and his sweating today is not abnormal. He just had a CPE yesterday. His LDL was 123, TSH was 1.45, WBC was 12.1, hgb was 13.5. he denies any CP. He also has a IVC filter. Symptoms are not increased with exertion.   He does not know his family history due to being adopted.   . Active Ambulatory Problems    Diagnosis Date Noted  . S/P total knee arthroplasty 01/26/2012  . Erectile dysfunction 01/26/2012  . Annual physical exam 01/26/2012  . Morbid obesity ([place]) 08/01/2012  . Spinal stenosis, lumbar region, with neurogenic claudication 12/21/2013  . Benign essential hypertension 03/26/2014  . Anxiety, generalized 05/11/2016  . Scoliosis of thoracolumbar spine 06/23/2016  . Mixed hyperlipidemia 04/18/2017  . High frequency hearing loss, left 09/10/2017  . Primary osteoarthritis of first carpometacarpal joint of left hand 11/21/2017  . Acute deep vein thrombosis (DVT) of calf muscle vein of right lower extremity 11/26/2017  . Intermittent claudication ([place]) 12/04/2017  . Degeneration of thoracic intervertebral disc 05/30/2017  . 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 Recurrent deep vein thrombosis (DVT) ([place]) 02/12/2018  . Health counseling 02/12/2018  . History of bilateral hip arthroplasty 03/27/2018  .  Onychomycosis 06/19/2018  . Venous stasis dermatitis of both lower extremities 06/19/2018  . Insomnia 06/19/2018  . Near syncope 06/20/2018  . Tachycardia 06/20/2018  . Nausea 06/20/2018  . PVC (premature ventricular contraction) 06/20/2018  . History of DVT (deep vein thrombosis) 06/20/2018   Resolved Ambulatory Problems    Diagnosis Date Noted  . Acute pharyngitis 10/27/2008  . Influenza 12/19/2011  . Hypertension 01/26/2012  . Cough 03/21/2012  . Hematochezia 04/01/2012  . Precordial pain 05/24/2012  . Lumbar spondylosis 06/19/2013  . Primary osteoarthritis of left hip 07/07/2013  . Lumbar spine scoliosis 12/21/2013  . Preoperative clearance 05/11/2014  . History of total right hip arthroplasty 05/25/2014  . Headache 06/20/2016  . Open dislocation of left second PIP of the foot 11/29/2016  . Nondisplaced fracture of distal phalanx of left great toe, initial encounter for closed fracture 11/30/2016  . Elevated CK 04/18/2017  . Tremor 09/10/2017  . Vertigo 09/10/2017  . Cramping of hands 10/25/2017  . Cracking skin 12/04/2017  . Pain of left hip joint 07/31/2017  . Cellulitis of foot, right 01/08/2018  . Hyperkeratosis of sole 01/31/2018  . Avascular necrosis of bone of left hip ([place]) 03/27/2018   Past Medical History:  Diagnosis Date  . Arthritis   . Asthma   . Back pain   . Blood clot in vein 06/2016  . Complication of anesthesia   . GERD (gastroesophageal reflux disease)   . Viral pericarditis 20 yrs ago    Review of  Systems See HPI.     Objective:   Physical Exam Vitals signs reviewed.  Constitutional:      General: He is in acute distress.     Appearance: He is diaphoretic.  HENT:     Head: Normocephalic.  Neck:     Vascular: No carotid bruit.  Cardiovascular:     Rate and Rhythm: Regular rhythm. Tachycardia present.     Pulses: Normal pulses.     Heart sounds: No murmur.     Comments: At times he was tachycardic and then normal rate.  Pulmonary:      Effort: Pulmonary effort is normal.     Breath sounds: Normal breath sounds.  Neurological:     General: No focal deficit present.     Mental Status: He is alert and oriented to person, place, and time.  Psychiatric:        Mood and Affect: Mood normal.           Assessment & Plan:  Marland KitchenMarland KitchenTovia was seen today for tachycardia.  Diagnoses and all orders for this visit:  Tachycardia -     EKG 12-Lead -     Troponin I -     Ambulatory referral to Cardiology  Near syncope -     Troponin I -     Ambulatory referral to Cardiology  Coughing -     Troponin I -     Ambulatory referral to Cardiology  Nausea -     Troponin I -     Ambulatory referral to Cardiology   First EKG: not a great print out. Shows a PVC. No ST elevation or depression. No arrhthymias.   He became more symptomatic and rhythm strip showed sinus tachycardia but at times looked like a.flutter with occasional PVC. Called on call cardiologist: she agreed with sinus tachycardia and PVC but no atrial flutter. She agreed with plan.   Nothing too concerning seen on EKG. Pt needs stress test and echo and maybe heart monitor to fully evaluate. Continue on eliqus and atenolol.   Will get STAT troponin. Other labs from yesterday look great.   If near syncope happens again get to ED.   Made appt with cardiology first thing in the am at 8;30.   Marland KitchenMarland KitchenSpent 50 minutes with patient and greater than 50 percent of visit spent counseling patient regarding treatment plan.

## 2018-06-20 NOTE — ED Provider Notes (Signed)
[facility] EMERGENCY DEPARTMENT Provider Note   CSN: [id number] Arrival date & time: 06/20/18  2149     History   Chief Complaint No chief complaint on file.   HPI Travis Huff is a 58 y.o. male.     Travis Huff is a 58 y.o. male with a history of hypertension, asthma, GERD, chronic back pain, who presents to the emergency department with elevated troponin.  Patient was seen at his PCPs office today after he had a near syncopal episode this morning he got up felt lightheaded, developed tunnel vision and then fell to his knees but did not completely lose consciousness, did not hit his head.  He denies associated chest pain, but felt like his heart was racing when this occurred.  He reports several similar episodes in the last 6 months but today's was the most severe.  He denies any chest pain throughout the day, no pain in the arm neck or jaw.  He does report some shortness of breath that is worse with exertion.  No cough or fever.  He is diaphoretic on arrival, reports that he is commonly very sweaty but today seems worse.  He denies nausea, vomiting or abdominal pain.  No prior history of ACS, when patient was seen at PCPs office today cardiology was consulted and patient had reassuring EKG, he was set up for follow-up appointment with cardiology tomorrow morning but was called this evening with elevated troponin and told to proceed to the emergency department.  No further syncopal episodes since this morning.     Past Medical History:  Diagnosis Date   Arthritis    Asthma    as child   Avascular necrosis of bone of left hip (HCC)    Back pain    Blood clot in vein 06/2016   x 2 no bleeding disorders found   Complication of anesthesia    ketamine hallucinations   GERD (gastroesophageal reflux disease)    Hypertension    Viral pericarditis 20 yrs ago    Patient Active Problem List   Diagnosis Date Noted   Near syncope 06/20/2018    Tachycardia 06/20/2018   Nausea 06/20/2018   PVC (premature ventricular contraction) 06/20/2018   History of DVT (deep vein thrombosis) 06/20/2018   Onychomycosis 06/19/2018   Venous stasis dermatitis of both lower extremities 06/19/2018   Insomnia 06/19/2018   History of bilateral hip arthroplasty 03/27/2018   Recurrent deep vein thrombosis (DVT) ([place]) 02/12/2018   Health counseling 02/12/2018   Hyperhomocystinemia ([place]) 01/10/2018   Intermittent claudication ([place]) 12/04/2017   Acute deep vein thrombosis (DVT) of calf muscle vein of right lower extremity 11/26/2017   Primary osteoarthritis of first carpometacarpal joint of left hand 11/21/2017   High frequency hearing loss, left 09/10/2017   Degeneration of thoracic intervertebral disc 05/30/2017   Mixed hyperlipidemia 04/18/2017   Scoliosis of thoracolumbar spine 06/23/2016   Anxiety, generalized 05/11/2016   Benign essential hypertension 03/26/2014   Spinal stenosis, lumbar region, with neurogenic claudication 12/21/2013   Morbid obesity ([place]) 08/01/2012   S/P total knee arthroplasty 01/26/2012   Erectile dysfunction 01/26/2012   Annual physical exam 01/26/2012    Past Surgical History:  Procedure Laterality Date   ABDOMINAL EXPOSURE N/A 06/23/2016   Procedure: ABDOMINAL EXPOSURE;  Surgeon: Rosetta Posner, MD;  Location: [facility] OR;  Service: Vascular;  Laterality: N/A;   ANTERIOR LATERAL LUMBAR FUSION 4 LEVELS Right 06/23/2016   Procedure: Right  Lumbar two-three Lumbar three-four Lumbar  four-five Anterolateral lumbar interbody fusion;  Surgeon: Erline Levine, MD;  Location: [place];  Service: Neurosurgery;  Laterality: Right;   ANTERIOR LUMBAR FUSION N/A 06/23/2016   Procedure: Lumbar five -sacral one  Anterior lumbar interbody fusion with Dr. Sherren Mocha Early for approach;  Surgeon: Erline Levine, MD;  Location: [place];  Service: Neurosurgery;  Laterality: N/A;   APPLICATION OF INTRAOPERATIVE CT SCAN N/A 06/26/2016     Procedure: APPLICATION OF INTRAOPERATIVE CT SCAN;  Surgeon: Erline Levine, MD;  Location: [place];  Service: Neurosurgery;  Laterality: N/A;   CARDIOVASCULAR STRESS TEST     Negative in May 2014   COLONOSCOPY     HERNIA REPAIR     umbilical   IR IVC FILTER PLMT / S&I /IMG GUID/MOD SED  03/26/2018   JOINT REPLACEMENT     POSTERIOR LUMBAR FUSION 4 LEVEL N/A 06/26/2016   Procedure: Thoracic ten to Pelvis fixation with AIRO;  Surgeon: Erline Levine, MD;  Location: [place];  Service: Neurosurgery;  Laterality: N/A;   TENDON REPAIR     right hand   TOTAL HIP ARTHROPLASTY Right 05/25/2014   Procedure: RIGHT TOTAL HIP ARTHROPLASTY ANTERIOR APPROACH;  Surgeon: Rod Can, MD;  Location: [place];  Service: Orthopedics;  Laterality: Right;   TOTAL HIP ARTHROPLASTY Left 03/27/2018   Procedure: TOTAL HIP ARTHROPLASTY ANTERIOR APPROACH- DA complex;  Surgeon: Rod Can, MD;  [facility];  Service: Orthopedics;  Laterality: Left;   TOTAL KNEE ARTHROPLASTY Bilateral         Home Medications    Prior to Admission medications   Medication Sig Start Date End Date Taking? Authorizing Provider  AMBULATORY NON FORMULARY MEDICATION Knee-high, medium compression, graduated compression stockings. Apply to lower extremities. Medium circ, long length 12/04/17   Silverio Decamp, MD  AMBULATORY NON FORMULARY MEDICATION Knee-high, medium compression, graduated compression stockings. Apply to lower extremities. [email or web address], Zippered Compression Stockings, medium circ, long length 06/19/18   Silverio Decamp, MD  apixaban (ELIQUIS) 2.5 MG TABS tablet Take 1 tablet (2.5 mg total) by mouth 2 (two) times daily. 06/19/18   Silverio Decamp, MD  atenolol (TENORMIN) 100 MG tablet TAKE 1 TABLET (100 MG TOTAL) BY MOUTH DAILY. 05/20/18   Silverio Decamp, MD  calcium carbonate (TUMS - DOSED IN MG ELEMENTAL CALCIUM) 500 MG chewable tablet Chew 3-4 tablets by mouth 2 (two) times  daily as needed (stomach cramps).     [provider]  clobetasol ointment (TEMOVATE) 1.61 % Apply 1 application topically 2 (two) times daily. and cover with cotton socks 12/04/17   Silverio Decamp, MD  clotrimazole-betamethasone (LOTRISONE) cream Apply 1 application topically 2 (two) times daily. 02/14/18   Silverio Decamp, MD  diazepam (VALIUM) 5 MG tablet Take 1 tablet (5 mg total) by mouth at bedtime as needed for muscle spasms. Patient taking differently: Take 5 mg by mouth at bedtime.  12/27/17   Deno Etienne, DO  docusate sodium (COLACE) 100 MG capsule Take 1 capsule (100 mg total) by mouth 2 (two) times daily. 03/28/18   Swinteck, Aaron Edelman, MD  DULoxetine (CYMBALTA) 60 MG capsule Take 1 capsule (60 mg total) by mouth at bedtime. 03/28/18   Silverio Decamp, MD  HYDROmorphone (DILAUDID) 2 MG tablet Take 1-2 tablets (2-4 mg total) by mouth every 4 (four) hours as needed for severe pain. 03/28/18   Swinteck, Aaron Edelman, MD  lisinopril-hydrochlorothiazide (ZESTORETIC) 20-25 MG tablet TAKE 1 TABLET BY MOUTH DAILY. 06/10/18   Silverio Decamp, MD  ondansetron (ZOFRAN) 4 MG tablet Take 1 tablet (4 mg total) by mouth every 6 (six) hours as needed for nausea. 03/28/18   Swinteck, Aaron Edelman, MD  senna (SENOKOT) 8.6 MG TABS tablet Take 2 tablets (17.2 mg total) by mouth at bedtime. 03/28/18   Swinteck, Aaron Edelman, MD  sildenafil (VIAGRA) 100 MG tablet Take 1 tablet (100 mg total) by mouth daily as needed for erectile dysfunction. 06/20/18   Silverio Decamp, MD  zolpidem (AMBIEN) 5 MG tablet Take 1 tablet (5 mg total) by mouth at bedtime as needed for sleep. 06/19/18   Silverio Decamp, MD    Family History Family History  Adopted: Yes  Problem Relation Age of Onset   Healthy Daughter     Social History Social History   Tobacco Use   Smoking status: Former Smoker    Packs/day: 1.00    Years: 25.00    Pack years: 25.00    Types: Cigarettes    Quit date: 2000    Years  since quitting: 20.4   Smokeless tobacco: Current User    Types: Snuff  Substance Use Topics   Alcohol use: Yes    Alcohol/week: 14.0 standard drinks    Types: 14 Cans of beer per week    Comment: 2 beers QD   Drug use: No     Allergies   Ketamine and Morphine and related   Review of Systems Review of Systems  Constitutional: Positive for diaphoresis. Negative for chills and fever.  HENT: Negative.   Respiratory: Positive for shortness of breath. Negative for cough and chest tightness.   Cardiovascular: Positive for palpitations. Negative for chest pain and leg swelling.  Gastrointestinal: Negative for abdominal pain, diarrhea, nausea and vomiting.  Musculoskeletal: Negative for arthralgias and myalgias.  Skin: Negative for color change and rash.  Neurological: Positive for light-headedness. Negative for dizziness, syncope and headaches.  All other systems reviewed and are negative.    Physical Exam Updated Vital Signs BP 106/78 (BP Location: Right Arm)    Pulse 82    Temp 99.3 F (37.4 C) (Oral)    Resp 20    Ht 6' (1.829 m)    Wt (!) 143.2 kg    SpO2 97%    BMI 42.82 kg/m   Physical Exam Vitals signs and nursing note reviewed.  Constitutional:      General: He is not in acute distress.    Appearance: Normal appearance. He is well-developed. He is obese. He is diaphoretic.     Comments: On arrival patient is diaphoretic but sitting comfortably and in no acute distress.  HENT:     Head: Normocephalic and atraumatic.     Mouth/Throat:     Mouth: Mucous membranes are moist.     Pharynx: Oropharynx is clear.  Eyes:     General:        Right eye: No discharge.        Left eye: No discharge.     Pupils: Pupils are equal, round, and reactive to light.  Neck:     Musculoskeletal: Neck supple.     Comments: Trachea midline. Cardiovascular:     Rate and Rhythm: Normal rate and regular rhythm.     Pulses:          Radial pulses are 2+ on the right side and 2+ on the  left side.       Dorsalis pedis pulses are 2+ on the right side and 2+ on the left side.  Heart sounds: Normal heart sounds. No murmur. No friction rub. No gallop.   Pulmonary:     Effort: Pulmonary effort is normal. No respiratory distress.     Breath sounds: Normal breath sounds. No wheezing or rales.     Comments: Respirations equal and unlabored, patient able to speak in full sentences, lungs clear to auscultation bilaterally Abdominal:     General: Bowel sounds are normal. There is no distension.     Palpations: Abdomen is soft. There is no mass.     Tenderness: There is no abdominal tenderness. There is no guarding.     Comments: Abdomen soft, nondistended, nontender to palpation in all quadrants without guarding or peritoneal signs  Musculoskeletal:        General: No deformity.     Right lower leg: No edema.     Left lower leg: No edema.     Comments: Bilateral lower extremities without edema, warm and well-perfused.  Skin:    General: Skin is warm.     Capillary Refill: Capillary refill takes less than 2 seconds.  Neurological:     Mental Status: He is alert.     Coordination: Coordination normal.     Comments: Speech is clear, able to follow commands CN III-XII intact Normal strength in upper and lower extremities bilaterally including dorsiflexion and plantar flexion, strong and equal grip strength Sensation normal to light and sharp touch Moves extremities without ataxia, coordination intact       ED Treatments / Results  Labs (all labs ordered are listed, but only abnormal results are displayed) Labs Reviewed  CBC WITH DIFFERENTIAL/PLATELET - Abnormal; Notable for the following components:      Result Value   WBC 12.5 (*)    Neutro Abs 9.3 (*)    Monocytes Absolute 1.2 (*)    Abs Immature Granulocytes 0.08 (*)    All other components within normal limits  TROPONIN I - Abnormal; Notable for the following components:   Troponin I 1.03 (*)    All other  components within normal limits  BRAIN NATRIURETIC PEPTIDE - Abnormal; Notable for the following components:   B Natriuretic Peptide 676.6 (*)    All other components within normal limits  HEPARIN LEVEL (UNFRACTIONATED) - Abnormal; Notable for the following components:   Heparin Unfractionated 1.46 (*)    All other components within normal limits  SARS CORONAVIRUS 2  MRSA PCR SCREENING  BASIC METABOLIC PANEL  MAGNESIUM  APTT    EKG NSR, HR 85, borderline normal axis, normal intervals, borderline incomplete RBBB, no ST or T wave signs of acute ischemia, no signs of acute RV strain  Radiology Ct Angio Chest Pe W And/or Wo Contrast  Result Date: 06/20/2018 CLINICAL DATA:  Chest pain EXAM: CT ANGIOGRAPHY CHEST WITH CONTRAST TECHNIQUE: Multidetector CT imaging of the chest was performed using the standard protocol during bolus administration of intravenous contrast. Multiplanar CT image reconstructions and MIPs were obtained to evaluate the vascular anatomy. CONTRAST:  30mL OMNIPAQUE IOHEXOL 350 MG/ML SOLN COMPARISON:  07/21/2016 FINDINGS: Cardiovascular: Cardiomegaly. Aorta is normal caliber. No filling defects in the pulmonary arteries to suggest pulmonary emboli. There is partial anomalous pulmonary venous return in the left upper lobe with the left upper lobe pulmonary vein draining into the left innominate vein. Mediastinum/Nodes: No mediastinal, hilar, or axillary adenopathy. Trachea and esophagus are unremarkable. Thyroid unremarkable. Lungs/Pleura: Mild vascular congestion. No confluent airspace opacities or effusions. Minimal bibasilar atelectasis. Upper Abdomen: Imaging into the upper abdomen shows no  acute findings. Musculoskeletal: Chest wall soft tissues are unremarkable. No acute bony abnormality. Review of the MIP images confirms the above findings. IMPRESSION: Cardiomegaly, vascular congestion. No evidence of pulmonary embolus. Partial anomalous pulmonary venous return in the left  upper lobe. Electronically Signed   By: Rolm Baptise M.D.   On: 06/20/2018 23:40   Dg Chest Port 1 View  Result Date: 06/20/2018 CLINICAL DATA:  Short of breath EXAM: PORTABLE CHEST 1 VIEW COMPARISON:  CT chest 07/21/2016 FINDINGS: Mild cardiomegaly with vascular congestion. No acute consolidation or effusion. No pneumothorax. IMPRESSION: Cardiomegaly with mild central vascular congestion Electronically Signed   By: Donavan Foil M.D.   On: 06/20/2018 22:27    Procedures Procedures (including critical care time)  Medications Ordered in ED Medications  heparin ADULT infusion 100 units/mL (25000 units/248mL sodium chloride 0.45%) (1,500 Units/hr Intravenous Rate/Dose Verify 06/21/18 0100)  aspirin tablet 325 mg (325 mg Oral Given 06/20/18 2252)  LORazepam (ATIVAN) injection 1 mg (1 mg Intravenous Given 06/20/18 2254)  iohexol (OMNIPAQUE) 350 MG/ML injection 80 mL (80 mLs Intravenous Contrast Given 06/20/18 2326)     Initial Impression / Assessment and Plan / ED Course  I have reviewed the triage vital signs and the nursing notes.  Pertinent labs & imaging results that were available during my care of the patient were reviewed by me and considered in my medical decision making (see chart for details).  Patient presents to the ED after he was called by PCP with elevated troponin at 1.41.  Was seen in the office today after near syncopal event, denies chest pain but reports he has had some exertional shortness of breath.  On arrival patient is diaphoretic but vitals are stable and he is otherwise well-appearing.  Current EKG without ST depressions or elevations, normal sinus rhythm.  Patient does have history of multiple DVTs is currently on Eliquis but recently had dose reduced.  Will consult cardiology, repeat troponin, labs, BNP ordered.  Patient given aspirin.  Case discussed with Dr. Neena Rhymes with cardiology who recommends proceeding with CT angio to rule out PE as cause for symptoms, but  will plan for cardiology admission with likely cath in the morning.  Ativan for anxiety prior to CT.  Initially discussed and plan to heparinize patient, but pharmacy recommended that since patient just took his Eliquis dose that we hold off on heparin for 12 hours, later discovered that heparin dose was just recently reduced to 2.5 and patient missed morning dose, cardiology requesting heparin, discussed with pharmacist they will check a PTT level and if this is not elevated we will go ahead and start heparin.  Cardiology at bedside to admit, they will follow-up on lab results and CT PE study.  Final Clinical Impressions(s) / ED Diagnoses   Final diagnoses:  Elevated troponin  Near syncope  Shortness of breath    ED Discharge Orders    None       Jacqlyn Larsen, [place] 06/21/18 0141    Isla Pence, MD 06/21/18 [phone]

## 2018-06-20 NOTE — ED Notes (Signed)
ED Provider at bedside. Will return for IV access

## 2018-06-20 NOTE — ED Triage Notes (Signed)
Pt from home referred to by PCP d/t elevated troponin 1.41.  Pt. Denies CP/SOB/N/V. No pain at this time.

## 2018-06-20 NOTE — ED Notes (Signed)
Per floor, pt. Has not been approved will call back 5-10 minutes

## 2018-06-20 NOTE — ED Notes (Signed)
ED TO INPATIENT HANDOFF REPORT  ED Nurse Name and Phone #:  Haroon Shatto [phone]  S Name/Age/Gender Travis Huff 58 y.o. male Room/Bed: 028C/028C  Code Status   Code Status: Prior  Home/SNF/Other Home Patient oriented to: self, place, time and situation Is this baseline? Yes   Triage Complete: Triage complete  Chief Complaint troponin positive 1.41  Triage Note Pt from home referred to by PCP d/t elevated troponin 1.41.  Pt. Denies CP/SOB/N/V. No pain at this time.      Allergies Allergies  Allergen Reactions  . Ketamine     hallucinations  . Morphine And Related Itching    Level of Care/Admitting Diagnosis ED Disposition    ED Disposition Condition [facility] Area: [place] [[id number]]  Level of Care: Progressive [102]  Covid Evaluation: Screening Protocol (No Symptoms)  Diagnosis: NSTEMI (non-ST elevated myocardial infarction) [facility]) [332951]  Admitting Physician: Marcie Mowers [[id number]]  Attending Physician: Leonie Man [[id number]]  Estimated length of stay: past midnight tomorrow  Certification:: I certify this patient will need inpatient services for at least 2 midnights  PT Class (Do Not Modify): Inpatient [101]  PT Acc Code (Do Not Modify): Private [1]       B Medical/Surgery History Past Medical History:  Diagnosis Date  . Arthritis   . Asthma    as child  . Avascular necrosis of bone of left hip ([place])   . Back pain   . Blood clot in vein 06/2016   x 2 no bleeding disorders found  . Complication of anesthesia    ketamine hallucinations  . GERD (gastroesophageal reflux disease)   . Hypertension   . Viral pericarditis 20 yrs ago   Past Surgical History:  Procedure Laterality Date  . ABDOMINAL EXPOSURE N/A 06/23/2016   Procedure: ABDOMINAL EXPOSURE;  Surgeon: Rosetta Posner, MD;  Location: [place];  Service: Vascular;  Laterality: N/A;  . ANTERIOR LATERAL LUMBAR FUSION 4 LEVELS Right 06/23/2016   Procedure: Right  Lumbar two-three Lumbar three-four Lumbar four-five Anterolateral lumbar interbody fusion;  Surgeon: Erline Levine, MD;  Location: [place];  Service: Neurosurgery;  Laterality: Right;  . ANTERIOR LUMBAR FUSION N/A 06/23/2016   Procedure: Lumbar five -sacral one  Anterior lumbar interbody fusion with Dr. Sherren Mocha Early for approach;  Surgeon: Erline Levine, MD;  Location: [place];  Service: Neurosurgery;  Laterality: N/A;  . APPLICATION OF INTRAOPERATIVE CT SCAN N/A 06/26/2016   Procedure: APPLICATION OF INTRAOPERATIVE CT SCAN;  Surgeon: Erline Levine, MD;  Location: [place];  Service: Neurosurgery;  Laterality: N/A;  . CARDIOVASCULAR STRESS TEST     Negative in May 2014  . COLONOSCOPY    . HERNIA REPAIR     umbilical  . IR IVC FILTER PLMT / S&I [facility] Keels GUID/MOD SED  03/26/2018  . JOINT REPLACEMENT    . POSTERIOR LUMBAR FUSION 4 LEVEL N/A 06/26/2016   Procedure: Thoracic ten to Pelvis fixation with AIRO;  Surgeon: Erline Levine, MD;  Location: [place];  Service: Neurosurgery;  Laterality: N/A;  . TENDON REPAIR     right hand  . TOTAL HIP ARTHROPLASTY Right 05/25/2014   Procedure: RIGHT TOTAL HIP ARTHROPLASTY ANTERIOR APPROACH;  Surgeon: Rod Can, MD;  Location: [place];  Service: Orthopedics;  Laterality: Right;  . TOTAL HIP ARTHROPLASTY Left 03/27/2018   Procedure: TOTAL HIP ARTHROPLASTY ANTERIOR APPROACH- DA complex;  Surgeon: Rod Can, MD;  [facility];  Service: Orthopedics;  Laterality: Left;  .  TOTAL KNEE ARTHROPLASTY Bilateral      A IV Location/Drains/Wounds Patient Lines/Drains/Airways Status   Active Line/Drains/Airways    Name:   Placement date:   Placement time:   Site:   Days:   Incision (Closed) 03/26/18 Neck Right   03/26/18    0923     86   Incision (Closed) 03/27/18 Hip Left   03/27/18    0915     85          Intake/Output Last 24 hours No intake or output data in the 24 hours ending 06/20/18 2300  Labs/Imaging Results for orders placed or performed  in visit on 06/20/18 (from the past 48 hour(s))  Troponin I     Status: Abnormal   Collection Time: 06/20/18  4:44 PM  Result Value Ref Range   Troponin I 1.41 (HH) < OR = 0.0 ng/mL    Comment: Verified by repeat analysis. [name] . In accord with published recommendations, serial testing of troponin I at intervals of 2 to 4 hours for up to 12 to 24 hours is suggested in order to corroborate a single troponin I result. An elevated troponin alone is not sufficient to make the diagnosis of MI. . . For additional information, please refer to  http://education.quest[employer].com/faq/FAQ202  (This link is being provided for informational/ educational purposes only.)    Dg Chest Port 1 View  Result Date: 06/20/2018 CLINICAL DATA:  Short of breath EXAM: PORTABLE CHEST 1 VIEW COMPARISON:  CT chest 07/21/2016 FINDINGS: Mild cardiomegaly with vascular congestion. No acute consolidation or effusion. No pneumothorax. IMPRESSION: Cardiomegaly with mild central vascular congestion Electronically Signed   By: Donavan Foil M.D.   On: 06/20/2018 22:27    Pending Labs Unresulted Labs (From admission, onward)    Start     Ordered   06/22/18 0500  Heparin level (unfractionated)  Daily,   R     06/20/18 2218   06/22/18 0500  CBC  Daily,   R     06/20/18 2218   06/22/18 0500  APTT  Daily,   R     06/20/18 2218   06/21/18 1700  APTT  Once-Timed,   R     06/20/18 2218   06/21/18 1700  Heparin level (unfractionated)  Once-Timed,   R     06/20/18 2218   06/20/18 2204  SARS Coronavirus 2 (CEPHEID - Performed in [place] hospital lab), [place]  (Asymptomatic Patients Labs)  Once,   STAT    Question:  Rule Out  Answer:  Yes   06/20/18 2203   06/20/18 2203  CBC with Differential  Once,   STAT     06/20/18 2203   06/20/18 9924  Basic metabolic panel  Once,   STAT     06/20/18 2203   06/20/18 2203  Troponin I - Once  Once,   STAT     06/20/18 2203   06/20/18 2203  Brain natriuretic peptide  Once,    STAT     06/20/18 2203   06/20/18 2203  Magnesium  ONCE - STAT,   STAT     06/20/18 2203   Signed and Held  HIV antibody (Routine Testing)  Tomorrow morning,   R     Signed and Held   Signed and Held  TSH  Add-on,   R     Signed and Held   Signed and Held  Troponin I - Tomorrow AM 0500  Tomorrow morning,   STAT  Signed and Held   Signed and Held  Lipid panel  Add-on,   R     Signed and Held   Signed and Held  CBC  Tomorrow morning,   R     Signed and Held   Signed and Held  Basic metabolic panel  Tomorrow morning,   R     Signed and Held          Vitals/Pain Today's Vitals   06/20/18 2159 06/20/18 2230  BP:  102/84  Pulse:  73  Resp:  17  SpO2:  93%  Weight: 136.1 kg   Height: 6' (1.829 m)   PainSc: 0-No pain     Isolation Precautions No active isolations  Medications Medications  heparin ADULT infusion 100 units/mL (25000 units/215mL sodium chloride 0.45%) (has no administration in time range)  aspirin tablet 325 mg (325 mg Oral Given 06/20/18 2252)  LORazepam (ATIVAN) injection 1 mg (1 mg Intravenous Given 06/20/18 2254)    Mobility walks with device Low fall risk   Focused Assessments Cardiac Assessment Handoff:    Lab Results  Component Value Date   CRFVOHK 067 (H) 04/17/2017   TROPONINI 1.41 ([place]) 06/20/2018   No results found for: DDIMER Does the Patient currently have chest pain? No     R Recommendations: See Admitting Provider Note  Report given to:   Additional Notes:

## 2018-06-20 NOTE — Progress Notes (Signed)
Pt called. Spoke with wife. Told to go to ED for cardiac work up due to elevated troponin. Wife agreed.

## 2018-06-20 NOTE — H&P (Signed)
CARDIOLOGY H&P  HPI: Travis Huff is a 58 y.o. male w/ anxiety, HTN, obesity, and multiple PE's (on apixaban and s/p IVC filter prior to orthopedic surgery) who presents with NSTEMI.   Patient reports multiple year history of episodic palpitations and diaphoresis.  For the past several years this happens only a few times per month and resolves spontaneously, thus was minimally bothersome.  Over the past several days however his symptoms have increased in frequency and severity.  He states that he has had multiple episodes per day where he feels like "his heart is pounding fast and hard" for 10 to 15 minutes at a time.  He has tried resting during these episodes which does mildly help his symptoms.  Additionally he has shortness of breath and severe diaphoresis during these episodes.  The symptoms however resolve with resolution of the palpitations.  Notably the patient denies any symptoms of chest pain or chest pressure.  He does endorse symptoms of nausea and lightheadedness.  Given his increasing frequency and severity of symptoms as noted above, the patient saw his primary care doctor today for evaluation.  Lab studies were drawn and his troponin was found to be elevated at 1.41.  He was thus contacted and told to go to the emergency department for evaluation.  In the emergency department, the patient states his last episode of palpitations was at approximately 4 AM this morning.  He has had none since that time.  He denies any symptoms of chest pain or chest pressure currently.  He also denies any shortness of breath or palpitations currently.    The patient notes that he has a history of multiple PEs and was on rivaroxaban previously but was eventually switched to apixaban.  He notes that he missed his dose of apixaban this morning but took his evening dose tonight.  Per medication reconciliation, it appears as though the patient is taking 2.5 mg of apixaban twice daily.  I am not able to find  a reason for him to be on a reduced dose.  He does have a history of IVC filter placement which was performed in March of this year prior to undergoing orthopedic surgery.  Review of Systems:     Cardiac Review of Systems: {Y] = yes [ ]  = no  Chest Pain [    ]  Resting SOB [Y] Exertional SOB  [Y]  Orthopnea [  ]   Pedal Edema [Y]    Palpitations [Y] Syncope  [  ]   Presyncope [Y]  General Review of Systems: [Y] = yes [  ]=no Constitional: recent weight change [  ]; anorexia [  ]; fatigue [  ]; nausea [  ]; night sweats [  ]; fever [  ]; or chills [  ];                                                                     Dental: poor dentition[  ];   Eye : blurred vision [  ]; diplopia [   ]; vision changes [  ];  Amaurosis fugax[  ]; Resp: cough [  ];  wheezing[  ];  hemoptysis[  ]; shortness of breath[  ]; paroxysmal nocturnal dyspnea[  ]; dyspnea  on exertion[  ]; or orthopnea[  ];  GI:  gallstones[  ], vomiting[  ];  dysphagia[  ]; melena[  ];  hematochezia [  ]; heartburn[  ];   GU: kidney stones [  ]; hematuria[  ];   dysuria [  ];  nocturia[  ];               Skin: rash [  ], swelling[  ];, hair loss[  ];  peripheral edema[  ];  or itching[  ]; Musculosketetal: myalgias[  ];  joint swelling[  ];  joint erythema[  ];  joint pain[Y];  back pain[  ];  Heme/Lymph: bruising[  ];  bleeding[  ];  anemia[  ];  Neuro: TIA[  ];  headaches[  ];  stroke[  ];  vertigo[  ];  seizures[  ];   paresthesias[  ];  difficulty walking[  ];  Psych:depression[  ]; anxiety[Y];  Endocrine: diabetes[  ];  thyroid dysfunction[  ];  Other:  Past Medical History:  Diagnosis Date   Arthritis    Asthma    as child   Avascular necrosis of bone of left hip (HCC)    Back pain    Blood clot in vein 06/2016   x 2 no bleeding disorders found   Complication of anesthesia    ketamine hallucinations   GERD (gastroesophageal reflux disease)    Hypertension    Viral pericarditis 20 yrs ago    Prior to  Admission medications   Medication Sig Start Date End Date Taking? Authorizing Provider  AMBULATORY NON FORMULARY MEDICATION Knee-high, medium compression, graduated compression stockings. Apply to lower extremities. Medium circ, long length 12/04/17   Silverio Decamp, MD  AMBULATORY NON FORMULARY MEDICATION Knee-high, medium compression, graduated compression stockings. Apply to lower extremities. [email or web address], Zippered Compression Stockings, medium circ, long length 06/19/18   Silverio Decamp, MD  apixaban (ELIQUIS) 2.5 MG TABS tablet Take 1 tablet (2.5 mg total) by mouth 2 (two) times daily. 06/19/18   Silverio Decamp, MD  atenolol (TENORMIN) 100 MG tablet TAKE 1 TABLET (100 MG TOTAL) BY MOUTH DAILY. 05/20/18   Silverio Decamp, MD  calcium carbonate (TUMS - DOSED IN MG ELEMENTAL CALCIUM) 500 MG chewable tablet Chew 3-4 tablets by mouth 2 (two) times daily as needed (stomach cramps).     [provider]  clobetasol ointment (TEMOVATE) 1.88 % Apply 1 application topically 2 (two) times daily. and cover with cotton socks 12/04/17   Silverio Decamp, MD  clotrimazole-betamethasone (LOTRISONE) cream Apply 1 application topically 2 (two) times daily. 02/14/18   Silverio Decamp, MD  diazepam (VALIUM) 5 MG tablet Take 1 tablet (5 mg total) by mouth at bedtime as needed for muscle spasms. Patient taking differently: Take 5 mg by mouth at bedtime.  12/27/17   Deno Etienne, DO  docusate sodium (COLACE) 100 MG capsule Take 1 capsule (100 mg total) by mouth 2 (two) times daily. 03/28/18   Swinteck, Aaron Edelman, MD  DULoxetine (CYMBALTA) 60 MG capsule Take 1 capsule (60 mg total) by mouth at bedtime. 03/28/18   Silverio Decamp, MD  HYDROmorphone (DILAUDID) 2 MG tablet Take 1-2 tablets (2-4 mg total) by mouth every 4 (four) hours as needed for severe pain. 03/28/18   Swinteck, Aaron Edelman, MD  lisinopril-hydrochlorothiazide (ZESTORETIC) 20-25 MG tablet TAKE 1 TABLET BY  MOUTH DAILY. 06/10/18   Silverio Decamp, MD  ondansetron (ZOFRAN) 4 MG tablet Take 1 tablet (4 mg total) by  mouth every 6 (six) hours as needed for nausea. 03/28/18   Swinteck, Aaron Edelman, MD  senna (SENOKOT) 8.6 MG TABS tablet Take 2 tablets (17.2 mg total) by mouth at bedtime. 03/28/18   Swinteck, Aaron Edelman, MD  sildenafil (VIAGRA) 100 MG tablet Take 1 tablet (100 mg total) by mouth daily as needed for erectile dysfunction. 06/20/18   Silverio Decamp, MD  zolpidem (AMBIEN) 5 MG tablet Take 1 tablet (5 mg total) by mouth at bedtime as needed for sleep. 06/19/18   Silverio Decamp, MD    Allergies  Allergen Reactions   Ketamine     hallucinations   Morphine And Related Itching    Social History   Socioeconomic History   Marital status: Married    Spouse name: Not on file   Number of children: 2   Years of education: 12   Highest education level: Not on file  Occupational History   Occupation: Public relations account executive  Social Needs   Financial resource strain: Not on file   Food insecurity    Worry: Not on file    Inability: Not on file   Transportation needs    Medical: Not on file    Non-medical: Not on file  Tobacco Use   Smoking status: Former Smoker    Packs/day: 1.00    Years: 25.00    Pack years: 25.00    Types: Cigarettes    Quit date: 2000    Years since quitting: 20.4   Smokeless tobacco: Current User    Types: Snuff  Substance and Sexual Activity   Alcohol use: Yes    Alcohol/week: 14.0 standard drinks    Types: 14 Cans of beer per week    Comment: 2 beers QD   Drug use: No   Sexual activity: Not on file  Lifestyle   Physical activity    Days per week: Not on file    Minutes per session: Not on file   Stress: Not on file  Relationships   Social connections    Talks on phone: Not on file    Gets together: Not on file    Attends religious service: Not on file    Active member of club or organization: Not on file    Attends meetings of  clubs or organizations: Not on file    Relationship status: Not on file   Intimate partner violence    Fear of current or ex partner: Not on file    Emotionally abused: Not on file    Physically abused: Not on file    Forced sexual activity: Not on file  Other Topics Concern   Not on file  Social History Narrative   Lives with wife in a one story home.  Has 2 children.  Works as a Public relations account executive.  Education: high school.     Family History  Adopted: Yes  Problem Relation Age of Onset   Healthy Daughter     PHYSICAL EXAM: Vitals:   06/20/18 2230  BP: 102/84  Pulse: 73  SpO2: 93%   General:  Well appearing.  Extremely anxious and diaphoretic.  Obese. HEENT: normal Neck: supple. no JVD.  Cor: PMI nondisplaced.  Distant heart sounds.  Regular rate & rhythm. No rubs, gallops or murmurs. Lungs: clear Abdomen: soft, nontender, nondistended. No hepatosplenomegaly. No bruits or masses. Good bowel sounds. Extremities: no cyanosis, clubbing, rash, edema Neuro: alert & oriented x 3, cranial nerves grossly intact. moves all 4 extremities w/o difficulty. Affect pleasant.  ECG: NSR, HR 85, borderline normal axis, normal intervals, borderline incomplete RBBB, no ST or T wave signs of acute ischemia, no signs of acute RV strain  Results for orders placed or performed in visit on 06/20/18 (from the past 24 hour(s))  Troponin I     Status: Abnormal   Collection Time: 06/20/18  4:44 PM  Result Value Ref Range   Troponin I 1.41 (HH) < OR = 0.0 ng/mL   Dg Chest Port 1 View  Result Date: 06/20/2018 CLINICAL DATA:  Short of breath EXAM: PORTABLE CHEST 1 VIEW COMPARISON:  CT chest 07/21/2016 FINDINGS: Mild cardiomegaly with vascular congestion. No acute consolidation or effusion. No pneumothorax. IMPRESSION: Cardiomegaly with mild central vascular congestion Electronically Signed   By: Donavan Foil M.D.   On: 06/20/2018 22:27   ASSESSMENT: Travis Huff is a 58 y.o. male w/ anxiety, HTN,  obesity, and multiple PE's (on apixaban and s/p IVC filter prior to orthopedic surgery) who presents with NSTEMI.   The patient symptoms are somewhat atypical for acute coronary syndrome, however he does have a markedly elevated troponin and multiple risk factors for coronary artery disease.  Reassuringly, his ECG is largely normal.  Given his substrate, ACS should be considered highest on the differential diagnosis.  Alternatively, the patient does have a long history of multiple thromboembolic events for which he is on oral anticoagulation with apixaban.  Interestingly, he is taking apixaban 2.5 mg twice daily, which is an inappropriately low dose for him in the absence of contraindication to full dose anticoagulation.  I am unable to find any medical reason in his chart for reduced apixaban dosing.  Similarly, he certainly does not meet dosing criteria for dose reduction. A CT-PE performed in the emergency department has not yet been read, but it does not appear as though there is a large proximal PE that would have caused a troponin elevation to this degree.   PLAN/DISCUSSION: #) NSTEMI Diagnostics: - repeat troponin q6h x 2 - echo in AM - check lipids, A1c, TSH, BNP Therapeutics: - NPO for cath in AM - ASA 324mg  then 81mg  daily - d/c home atenolol, start metoprolol succinate100mg  daily (note dose reducion compared to atenolol, can likely increase prior to discharge) - heparin drip for ACS per pharmacy protocol - atorvastatin 80mg  QHS - cont home lisinopril/HCTZ - defer P2Y12 until after cath - cardiac rehab  #) History of PE: - hold apixaban for now - heparin as per above - aspirin for now, will need to determine duration and intensity of antiplatelet therapy in addition to anticoagulation therapy at discharge pending cath results - should look into reasoning behind patient being on reduced dose apixaban  Marcie Mowers, MD Cardiology Fellow, PGY-6

## 2018-06-20 NOTE — ED Notes (Signed)
Dr is in with pt at this  Moment. Will return for blood draw.

## 2018-06-21 ENCOUNTER — Inpatient Hospital Stay (HOSPITAL_COMMUNITY): Payer: 59

## 2018-06-21 ENCOUNTER — Telehealth: Payer: 59 | Admitting: Cardiology

## 2018-06-21 ENCOUNTER — Encounter (HOSPITAL_COMMUNITY)
Admission: EM | Disposition: A | Discharge status: Home / Self Care | Payer: Self-pay | Source: Home / Self Care | Attending: Cardiology

## 2018-06-21 DIAGNOSIS — I214 Non-ST elevation (NSTEMI) myocardial infarction: Principal | ICD-10-CM

## 2018-06-21 DIAGNOSIS — I371 Nonrheumatic pulmonary valve insufficiency: Secondary | ICD-10-CM

## 2018-06-21 DIAGNOSIS — I2510 Atherosclerotic heart disease of native coronary artery without angina pectoris: Secondary | ICD-10-CM

## 2018-06-21 HISTORY — PX: LEFT HEART CATH AND CORONARY ANGIOGRAPHY: CATH118249

## 2018-06-21 LAB — CBC
HCT: 42.6 % (ref 39.0–52.0)
Hemoglobin: 13.6 g/dL (ref 13.0–17.0)
MCH: 28 pg (ref 26.0–34.0)
MCHC: 31.9 g/dL (ref 30.0–36.0)
MCV: 87.7 fL (ref 80.0–100.0)
Platelets: 282 10*3/uL (ref 150–400)
RBC: 4.86 MIL/uL (ref 4.22–5.81)
RDW: 15.3 % (ref 11.5–15.5)
WBC: 9.5 10*3/uL (ref 4.0–10.5)
nRBC: 0 % (ref 0.0–0.2)

## 2018-06-21 LAB — BASIC METABOLIC PANEL
Anion gap: 12 (ref 5–15)
Anion gap: 11 (ref 5–15)
BUN: 15 mg/dL (ref 6–20)
BUN: 13 mg/dL (ref 6–20)
CO2: 25 mmol/L (ref 22–32)
CO2: 28 mmol/L (ref 22–32)
Calcium: 9.5 mg/dL (ref 8.9–10.3)
Calcium: 9.6 mg/dL (ref 8.9–10.3)
Chloride: 100 mmol/L (ref 98–111)
Chloride: 99 mmol/L (ref 98–111)
Creatinine, Ser: 0.99 mg/dL (ref 0.61–1.24)
Creatinine, Ser: 0.99 mg/dL (ref 0.61–1.24)
GFR calc Af Amer: >60 mL/min (ref >60)
GFR calc Af Amer: >60 mL/min (ref >60)
GFR calc non Af Amer: >60 mL/min (ref >60)
GFR calc non Af Amer: >60 mL/min (ref >60)
Glucose, Bld: 92 mg/dL (ref 70–99)
Glucose, Bld: 115 mg/dL — ABNORMAL HIGH (ref 70–99)
Potassium: 4.2 mmol/L (ref 3.5–5.1)
Potassium: 4.6 mmol/L (ref 3.5–5.1)
Sodium: 137 mmol/L (ref 135–145)
Sodium: 138 mmol/L (ref 135–145)

## 2018-06-21 LAB — ECHOCARDIOGRAM COMPLETE
Height: 72 in
Weight: 5051.18 oz

## 2018-06-21 LAB — APTT
aPTT: 32 seconds (ref 24–36)
aPTT: 47 seconds — ABNORMAL HIGH (ref 24–36)

## 2018-06-21 LAB — SARS CORONAVIRUS 2: SARS Coronavirus 2: NOT DETECTED

## 2018-06-21 LAB — TROPONIN I
Troponin I: 1.03 ng/mL (ref <0.03)
Troponin I: 0.65 ng/mL (ref <0.03)

## 2018-06-21 LAB — BRAIN NATRIURETIC PEPTIDE: B Natriuretic Peptide: 676.6 pg/mL — ABNORMAL HIGH (ref 0.0–100.0)

## 2018-06-21 LAB — MAGNESIUM: Magnesium: 2 mg/dL (ref 1.7–2.4)

## 2018-06-21 LAB — HEPARIN LEVEL (UNFRACTIONATED): Heparin Unfractionated: 1.46 IU/mL — ABNORMAL HIGH (ref 0.30–0.70)

## 2018-06-21 LAB — HIV ANTIBODY (ROUTINE TESTING W REFLEX): HIV Screen 4th Generation wRfx: NONREACTIVE

## 2018-06-21 LAB — MRSA PCR SCREENING: MRSA by PCR: NEGATIVE

## 2018-06-21 LAB — TSH: TSH: 4.369 u[IU]/mL (ref 0.350–4.500)

## 2018-06-21 SURGERY — LEFT HEART CATH AND CORONARY ANGIOGRAPHY
Anesthesia: LOCAL

## 2018-06-21 MED ORDER — HEPARIN (PORCINE) 25000 UT/250ML-% IV SOLN
1500.0000 [IU]/h | INTRAVENOUS | Status: DC
  Administered 2018-06-21: 1500 [IU]/h via INTRAVENOUS
  Filled 2018-06-21: qty 250

## 2018-06-21 MED ORDER — LISINOPRIL 20 MG PO TABS
20.0000 mg | ORAL_TABLET | Freq: Every day | ORAL | Status: DC
  Administered 2018-06-21 – 2018-06-22 (×2): 20 mg via ORAL
  Filled 2018-06-21 (×2): qty 1

## 2018-06-21 MED ORDER — MIDAZOLAM HCL 2 MG/2ML IJ SOLN
INTRAMUSCULAR | Status: AC
  Filled 2018-06-21: qty 2

## 2018-06-21 MED ORDER — HEPARIN (PORCINE) IN NACL 1000-0.9 UT/500ML-% IV SOLN
INTRAVENOUS | Status: DC | PRN
  Administered 2018-06-21 (×2): 500 mL

## 2018-06-21 MED ORDER — PERFLUTREN LIPID MICROSPHERE
1.0000 mL | INTRAVENOUS | Status: AC | PRN
  Administered 2018-06-21: 2 mL via INTRAVENOUS
  Filled 2018-06-21: qty 10

## 2018-06-21 MED ORDER — LIDOCAINE HCL (PF) 1 % IJ SOLN
INTRAMUSCULAR | Status: AC
  Filled 2018-06-21: qty 30

## 2018-06-21 MED ORDER — SODIUM CHLORIDE 0.9 % WEIGHT BASED INFUSION: 3.0000 mL/kg/h | INTRAVENOUS | Status: DC

## 2018-06-21 MED ORDER — SODIUM CHLORIDE 0.9 % WEIGHT BASED INFUSION
3.0000 mL/kg/h | INTRAVENOUS | Status: DC
  Administered 2018-06-21: 3 mL/kg/h via INTRAVENOUS

## 2018-06-21 MED ORDER — VERAPAMIL HCL 2.5 MG/ML IV SOLN
INTRAVENOUS | Status: AC
  Filled 2018-06-21: qty 2

## 2018-06-21 MED ORDER — SODIUM CHLORIDE 0.9 % WEIGHT BASED INFUSION: 1.0000 mL/kg/h | INTRAVENOUS | Status: DC

## 2018-06-21 MED ORDER — HYDRALAZINE HCL 20 MG/ML IJ SOLN: 10.0000 mg | INTRAMUSCULAR | Status: AC | PRN

## 2018-06-21 MED ORDER — LABETALOL HCL 5 MG/ML IV SOLN: 10.0000 mg | INTRAVENOUS | Status: AC | PRN

## 2018-06-21 MED ORDER — SODIUM CHLORIDE 0.9 % IV SOLN: 250.0000 mL | INTRAVENOUS | Status: DC | PRN

## 2018-06-21 MED ORDER — SODIUM CHLORIDE 0.9% FLUSH: 3.0000 mL | INTRAVENOUS | Status: DC | PRN

## 2018-06-21 MED ORDER — IOHEXOL 350 MG/ML SOLN
INTRAVENOUS | Status: DC | PRN
  Administered 2018-06-21: 80 mL via INTRACARDIAC

## 2018-06-21 MED ORDER — ENOXAPARIN SODIUM 40 MG/0.4ML ~~LOC~~ SOLN
40.0000 mg | SUBCUTANEOUS | Status: DC
  Administered 2018-06-21: 40 mg via SUBCUTANEOUS
  Filled 2018-06-21: qty 0.4

## 2018-06-21 MED ORDER — HEPARIN SODIUM (PORCINE) 1000 UNIT/ML IJ SOLN
INTRAMUSCULAR | Status: AC
  Filled 2018-06-21: qty 1

## 2018-06-21 MED ORDER — HYDROCHLOROTHIAZIDE 25 MG PO TABS
25.0000 mg | ORAL_TABLET | Freq: Every day | ORAL | Status: DC
  Administered 2018-06-21 – 2018-06-22 (×2): 25 mg via ORAL
  Filled 2018-06-21 (×2): qty 1

## 2018-06-21 MED ORDER — SODIUM CHLORIDE 0.9 % IV SOLN
INTRAVENOUS | Status: AC
  Administered 2018-06-21: 15:00:00 via INTRAVENOUS

## 2018-06-21 MED ORDER — VERAPAMIL HCL 2.5 MG/ML IV SOLN
INTRAVENOUS | Status: DC | PRN
  Administered 2018-06-21: 10 mL via INTRA_ARTERIAL

## 2018-06-21 MED ORDER — SODIUM CHLORIDE 0.9% FLUSH
3.0000 mL | Freq: Two times a day (BID) | INTRAVENOUS | Status: DC
  Administered 2018-06-21 – 2018-06-22 (×2): 3 mL via INTRAVENOUS

## 2018-06-21 MED ORDER — ASPIRIN 81 MG PO CHEW: 81.0000 mg | CHEWABLE_TABLET | ORAL | Status: DC

## 2018-06-21 MED ORDER — SODIUM CHLORIDE 0.9% FLUSH
3.0000 mL | Freq: Two times a day (BID) | INTRAVENOUS | Status: DC
  Administered 2018-06-21: 3 mL via INTRAVENOUS

## 2018-06-21 MED ORDER — MIDAZOLAM HCL 2 MG/2ML IJ SOLN
INTRAMUSCULAR | Status: DC | PRN
  Administered 2018-06-21: 2 mg via INTRAVENOUS
  Administered 2018-06-21: 1 mg via INTRAVENOUS

## 2018-06-21 MED ORDER — LIDOCAINE HCL (PF) 1 % IJ SOLN
INTRAMUSCULAR | Status: DC | PRN
  Administered 2018-06-21: 2 mL

## 2018-06-21 MED ORDER — FENTANYL CITRATE (PF) 100 MCG/2ML IJ SOLN
INTRAMUSCULAR | Status: DC | PRN
  Administered 2018-06-21 (×2): 25 ug via INTRAVENOUS

## 2018-06-21 MED ORDER — ACETAMINOPHEN 325 MG PO TABS: 650.0000 mg | ORAL_TABLET | ORAL | Status: DC | PRN

## 2018-06-21 MED ORDER — HEPARIN SODIUM (PORCINE) 1000 UNIT/ML IJ SOLN
INTRAMUSCULAR | Status: DC | PRN
  Administered 2018-06-21: 5000 [IU] via INTRAVENOUS

## 2018-06-21 MED ORDER — FENTANYL CITRATE (PF) 100 MCG/2ML IJ SOLN
INTRAMUSCULAR | Status: AC
  Filled 2018-06-21: qty 2

## 2018-06-21 MED ORDER — ONDANSETRON HCL 4 MG/2ML IJ SOLN: 4.0000 mg | Freq: Four times a day (QID) | INTRAMUSCULAR | Status: DC | PRN

## 2018-06-21 SURGICAL SUPPLY — 11 items
CATH 5FR JL3.5 JR4 ANG PIG MP (CATHETERS) ×2 IMPLANT
DEVICE RAD COMP TR BAND LRG (VASCULAR PRODUCTS) ×2 IMPLANT
GLIDESHEATH SLEND SS 6F .021 (SHEATH) ×2 IMPLANT
GUIDEWIRE INQWIRE 1.5J.035X260 (WIRE) ×1 IMPLANT
INQWIRE 1.5J .035X260CM (WIRE) ×2
KIT HEART LEFT (KITS) ×2 IMPLANT
PACK CARDIAC CATHETERIZATION (CUSTOM PROCEDURE TRAY) ×2 IMPLANT
SHEATH PROBE COVER 6X72 (BAG) ×2 IMPLANT
TRANSDUCER W/STOPCOCK (MISCELLANEOUS) ×2 IMPLANT
TUBING CIL FLEX 10 FLL-RA (TUBING) ×2 IMPLANT
WIRE HI TORQ VERSACORE-J 145CM (WIRE) ×2 IMPLANT

## 2018-06-21 NOTE — Progress Notes (Signed)
ANTICOAGULATION CONSULT NOTE - Initial Consult  Pharmacy Consult:  Heparin Indication: chest pain/ACS  Allergies  Allergen Reactions  . Ketamine     hallucinations  . Morphine And Related Itching    Patient Measurements: Height: 6' (182.9 cm) Weight: (!) 315 lb 11.2 oz (143.2 kg) IBW/kg (Calculated) : 77.6 Heparin Dosing Weight: 108kg  Vital Signs: Temp: 99.3 F (37.4 C) (06/18 2356) Temp Source: Oral (06/18 2356) BP: 90/64 (06/18 2356) Pulse Rate: 82 (06/18 2356)  Labs: Recent Labs    06/19/18 1109 06/20/18 1644 06/20/18 2254 06/20/18 2318  HGB 13.5  --  14.1  --   HCT 41.3  --  45.4  --   PLT 303  --  310  --   APTT  --   --   --  32  HEPARINUNFRC  --   --   --  1.46*  CREATININE 0.97  --  0.99  --   TROPONINI  --  1.41* 1.03*  --     Estimated Creatinine Clearance: 120.9 mL/min (by C-G formula based on SCr of 0.99 mg/dL).   Assessment: 77 yom presented to the ED from his PCP office with an elevated troponin. To start IV heparin.  He is on Eliquis PTA for history of PE with last dose on 6/18 PM.  The original plan was to start IV heparin 12 hours after the last Eliquis dose; however, the Eliquis dose was reduced to 2.5mg  BID for an unknown reason and patient also missed his morning dose, so Cardiology wants to start IV heparin sooner rather than later.  Baseline heparin level is elevated at 1.46, indicating that there is Eliquis present in patient's system. Baseline aPTT is low, so will proceed with starting IV heparin now per MD request.  No bleeding reported.  Goal of Therapy:  Heparin level 0.3-0.7 units/ml aPTT 66-102 seconds Monitor platelets by anticoagulation protocol: Yes   Plan:  Heparin gtt 1500 units/hr, no bolus Check an 8 hour aPTT Daily heparin level, aPTT and CBC  Rondo Spittler D. Mina Marble, PharmD, BCPS, [place] 06/21/2018, 12:44 AM

## 2018-06-21 NOTE — Progress Notes (Signed)
CRITICAL VALUE ALERT  Critical Value:  Troponin 1.03  Date & Time Notied:  06/21/18 at [place]  Provider Notified: Dr. Emilio Aspen  Orders Received/Actions taken: No new orders

## 2018-06-21 NOTE — Progress Notes (Addendum)
The patient has been seen in conjunction with Travis Huff, PAC. All aspects of care have been considered and discussed. The patient has been personally interviewed, examined, and all clinical data has been reviewed.   Increasingly frequent episodes of palpitation.  Complained to primary care about this and had troponin drawn which was elevated.  Hospitalized because of troponin.  No recurrent palpitations since admission.  Other relevant history includes recurrent DVT with one episode of PE.  Had vena caval umbrella placed prior to hip surgery.  Umbrella has not been removed.  Has been evaluated by hematology and has no thrombophilia that was identified however they have recommended continuing anticoagulation long-term.  Most recently on apixaban 5 mg twice daily with new recommendation by hematology for 2.5 mg twice daily.  Last dose of apixaban 5 mg was 18 hours ago.  The dose prior to that was missed.  Under the circumstances, recommend proceeding with coronary angiography with every attempt made to complete the procedure from the radial approach.  The procedure and risks were discussed with the patient in detail.  Discussed with Dr. Angelena Form  No significant arrhythmias identified to this point.  Will need long-term monitoring as outpatient to identify the arrhythmia if we are not likely enough to capture an episode while hospitalized.  Based upon his EKG, I am concerned he may have pulmonary hypertension.  If so, he should be considered for sleep testing to exclude obstructive sleep apnea.  His wife states that he has excessive daytime sleepiness.  The patient (wife on phone) was counseled to undergo left heart catheterization, coronary angiography, and possible percutaneous coronary intervention with stent implantation if indicated. The procedural risks and benefits were discussed in detail. The risks discussed include death, stroke, myocardial infarction, life-threatening bleeding, limb  ischemia, kidney injury, allergy, and possible emergency cardiac surgery. The risk of these significant complications were estimated to occur less than 1% of the time.  Understands that every attempt will be made to perform via the right radial approach.  Recent apixaban therapy will increase the risk of bleeding.  After discussion, the patient has agreed to proceed.    Progress Note  Patient Name: Travis Huff Date of Encounter: 06/21/2018  Primary Cardiologist: New  Subjective   No recurrent chest pain since admit. No dyspnea. On heparin.   Inpatient Medications    Scheduled Meds:  aspirin EC  81 mg Oral Daily   atorvastatin  80 mg Oral q1800   docusate sodium  100 mg Oral BID   DULoxetine  60 mg Oral QHS   lisinopril  20 mg Oral Daily   And   hydrochlorothiazide  25 mg Oral Daily   metoprolol succinate  100 mg Oral Daily   senna  2 tablet Oral QHS   Continuous Infusions:  heparin 1,500 Units/hr (06/21/18 0500)   PRN Meds: acetaminophen, calcium carbonate, diazepam, nitroGLYCERIN, ondansetron (ZOFRAN) IV, zolpidem   Vital Signs    Vitals:   06/20/18 2349 06/20/18 2356 06/21/18 0345 06/21/18 0744  BP:  90/64 (!) 123/98 118/86  Pulse:  82 69   Resp:  20 (!) 24   Temp:  99.3 F (37.4 C) 98.3 F (36.8 C) 98.3 F (36.8 C)  TempSrc:  Oral Oral Oral  SpO2: 97% 97% 97%   Weight:  (!) 143.2 kg    Height:  6' (1.829 m)      Intake/Output Summary (Last 24 hours) at 06/21/2018 0752 Last data filed at 06/21/2018 0500 Gross per 24  hour  Intake 311.09 ml  Output 725 ml  Net -413.91 ml   Last 3 Weights 06/20/2018 06/20/2018 06/20/2018  Weight (lbs) 315 lb 11.2 oz 300 lb 315 lb  Weight (kg) 143.2 kg 136.079 kg 142.883 kg      Telemetry    NSR at 70s - Personally Reviewed  ECG    No new tracing - Personally Reviewed  Physical Exam   GEN: Obese male in no acute distress.   Neck: No JVD Cardiac: RRR, no murmurs, rubs, or gallops.  Respiratory: Clear to  auscultation bilaterally. GI: Soft, nontender, non-distended  MS: No edema; No deformity. Neuro:  Nonfocal  Psych: Normal affect   Labs    Chemistry Recent Labs  Lab 06/19/18 1109 06/20/18 2254 06/21/18 0532  NA 141 137 138  K 4.6 4.2 4.6  CL 102 100 99  CO2 28 25 28   GLUCOSE 111* 92 115*  BUN 22 15 13   CREATININE 0.97 0.99 0.99  CALCIUM 9.6 9.5 9.6  PROT 6.9  --   --   AST 32  --   --   ALT 32  --   --   BILITOT 0.3  --   --   GFRNONAA 86 >60 >60  GFRAA 100 >60 >60  ANIONGAP  --  12 11     Hematology Recent Labs  Lab 06/19/18 1109 06/20/18 2254 06/21/18 0532  WBC 12.1* 12.5* 9.5  RBC 4.78 5.04 4.86  HGB 13.5 14.1 13.6  HCT 41.3 45.4 42.6  MCV 86.4 90.1 87.7  MCH 28.2 28.0 28.0  MCHC 32.7 31.1 31.9  RDW 14.2 15.3 15.3  PLT 303 310 282    Cardiac Enzymes Recent Labs  Lab 06/20/18 1644 06/20/18 2254 06/21/18 0532  TROPONINI 1.41* 1.03* 0.65*   No results for input(s): TROPIPOC in the last 168 hours.   BNP Recent Labs  Lab 06/20/18 2254  BNP 676.6*     DDimer No results for input(s): DDIMER in the last 168 hours.   Radiology    Ct Angio Chest Pe W And/or Wo Contrast  Result Date: 06/20/2018 CLINICAL DATA:  Chest pain EXAM: CT ANGIOGRAPHY CHEST WITH CONTRAST TECHNIQUE: Multidetector CT imaging of the chest was performed using the standard protocol during bolus administration of intravenous contrast. Multiplanar CT image reconstructions and MIPs were obtained to evaluate the vascular anatomy. CONTRAST:  78mL OMNIPAQUE IOHEXOL 350 MG/ML SOLN COMPARISON:  07/21/2016 FINDINGS: Cardiovascular: Cardiomegaly. Aorta is normal caliber. No filling defects in the pulmonary arteries to suggest pulmonary emboli. There is partial anomalous pulmonary venous return in the left upper lobe with the left upper lobe pulmonary vein draining into the left innominate vein. Mediastinum/Nodes: No mediastinal, hilar, or axillary adenopathy. Trachea and esophagus are  unremarkable. Thyroid unremarkable. Lungs/Pleura: Mild vascular congestion. No confluent airspace opacities or effusions. Minimal bibasilar atelectasis. Upper Abdomen: Imaging into the upper abdomen shows no acute findings. Musculoskeletal: Chest wall soft tissues are unremarkable. No acute bony abnormality. Review of the MIP images confirms the above findings. IMPRESSION: Cardiomegaly, vascular congestion. No evidence of pulmonary embolus. Partial anomalous pulmonary venous return in the left upper lobe. Electronically Signed   By: Rolm Baptise M.D.   On: 06/20/2018 23:40   Dg Chest Port 1 View  Result Date: 06/20/2018 CLINICAL DATA:  Short of breath EXAM: PORTABLE CHEST 1 VIEW COMPARISON:  CT chest 07/21/2016 FINDINGS: Mild cardiomegaly with vascular congestion. No acute consolidation or effusion. No pneumothorax. IMPRESSION: Cardiomegaly with mild central vascular congestion Electronically Signed  By: Donavan Foil M.D.   On: 06/20/2018 22:27   Cardiac Studies   Pending echo and cath  Patient Profile     WYNDHAM SANTILLI is a 58 y.o. male w/ anxiety, HTN, obesity, and multiple PE's (on apixaban and s/p IVC filter prior to orthopedic surgery) who presents with NSTEMI.   Assessment & Plan    1. NSTEMI - Symptoms could be atypical for ACS. Also had palpitations.  - CTA iof chest without PE. - Troponin trend of 1.41>>>1.03>>0.65.  - He missed his Eliquis 2.5mg  yesterday morning, but took last dose at 8:30pm 06/20/18. - Currently on heparin. Chest pain free since admit. ? His symptoms related to tachycardia.  - Keep NPO but will give clear liquid breakfast this morning. - Dr. Tamala Julian to see later today - Continue ASA, statin and BB  2. Hx of PE - Eliquis on hold as above  3. HTN - BP stable on current medications.   For questions or updates, please contact [place] Please consult [email or web address] for contact info under        SignedLeanor Kail, PA  06/21/2018, 7:52 AM

## 2018-06-21 NOTE — Progress Notes (Signed)
  Echocardiogram 2D Echocardiogram has been performed.  Matilde Bash 06/21/2018, 10:27 AM

## 2018-06-21 NOTE — H&P (View-Only) (Signed)
The patient has been seen in conjunction with Vin Bhagat, PAC. All aspects of care have been considered and discussed. The patient has been personally interviewed, examined, and all clinical data has been reviewed.   Increasingly frequent episodes of palpitation.  Complained to primary care about this and had troponin drawn which was elevated.  Hospitalized because of troponin.  No recurrent palpitations since admission.  Other relevant history includes recurrent DVT with one episode of PE.  Had vena caval umbrella placed prior to hip surgery.  Umbrella has not been removed.  Has been evaluated by hematology and has no thrombophilia that was identified however they have recommended continuing anticoagulation long-term.  Most recently on apixaban 5 mg twice daily with new recommendation by hematology for 2.5 mg twice daily.  Last dose of apixaban 5 mg was 18 hours ago.  The dose prior to that was missed.  Under the circumstances, recommend proceeding with coronary angiography with every attempt made to complete the procedure from the radial approach.  The procedure and risks were discussed with the patient in detail.  Discussed with Dr. Angelena Form  No significant arrhythmias identified to this point.  Will need long-term monitoring as outpatient to identify the arrhythmia if we are not likely enough to capture an episode while hospitalized.  Based upon his EKG, I am concerned he may have pulmonary hypertension.  If so, he should be considered for sleep testing to exclude obstructive sleep apnea.  His wife states that he has excessive daytime sleepiness.  The patient (wife on phone) was counseled to undergo left heart catheterization, coronary angiography, and possible percutaneous coronary intervention with stent implantation if indicated. The procedural risks and benefits were discussed in detail. The risks discussed include death, stroke, myocardial infarction, life-threatening bleeding, limb  ischemia, kidney injury, allergy, and possible emergency cardiac surgery. The risk of these significant complications were estimated to occur less than 1% of the time.  Understands that every attempt will be made to perform via the right radial approach.  Recent apixaban therapy will increase the risk of bleeding.  After discussion, the patient has agreed to proceed.    Progress Note  Patient Name: Travis Huff Date of Encounter: 06/21/2018  Primary Cardiologist: New  Subjective   No recurrent chest pain since admit. No dyspnea. On heparin.   Inpatient Medications    Scheduled Meds:  aspirin EC  81 mg Oral Daily   atorvastatin  80 mg Oral q1800   docusate sodium  100 mg Oral BID   DULoxetine  60 mg Oral QHS   lisinopril  20 mg Oral Daily   And   hydrochlorothiazide  25 mg Oral Daily   metoprolol succinate  100 mg Oral Daily   senna  2 tablet Oral QHS   Continuous Infusions:  heparin 1,500 Units/hr (06/21/18 0500)   PRN Meds: acetaminophen, calcium carbonate, diazepam, nitroGLYCERIN, ondansetron (ZOFRAN) IV, zolpidem   Vital Signs    Vitals:   06/20/18 2349 06/20/18 2356 06/21/18 0345 06/21/18 0744  BP:  90/64 (!) 123/98 118/86  Pulse:  82 69   Resp:  20 (!) 24   Temp:  99.3 F (37.4 C) 98.3 F (36.8 C) 98.3 F (36.8 C)  TempSrc:  Oral Oral Oral  SpO2: 97% 97% 97%   Weight:  (!) 143.2 kg    Height:  6' (1.829 m)      Intake/Output Summary (Last 24 hours) at 06/21/2018 0752 Last data filed at 06/21/2018 0500 Gross per 24  hour  Intake 311.09 ml  Output 725 ml  Net -413.91 ml   Last 3 Weights 06/20/2018 06/20/2018 06/20/2018  Weight (lbs) 315 lb 11.2 oz 300 lb 315 lb  Weight (kg) 143.2 kg 136.079 kg 142.883 kg      Telemetry    NSR at 70s - Personally Reviewed  ECG    No new tracing - Personally Reviewed  Physical Exam   GEN: Obese male in no acute distress.   Neck: No JVD Cardiac: RRR, no murmurs, rubs, or gallops.  Respiratory: Clear to  auscultation bilaterally. GI: Soft, nontender, non-distended  MS: No edema; No deformity. Neuro:  Nonfocal  Psych: Normal affect   Labs    Chemistry Recent Labs  Lab 06/19/18 1109 06/20/18 2254 06/21/18 0532  NA 141 137 138  K 4.6 4.2 4.6  CL 102 100 99  CO2 28 25 28   GLUCOSE 111* 92 115*  BUN 22 15 13   CREATININE 0.97 0.99 0.99  CALCIUM 9.6 9.5 9.6  PROT 6.9  --   --   AST 32  --   --   ALT 32  --   --   BILITOT 0.3  --   --   GFRNONAA 86 >60 >60  GFRAA 100 >60 >60  ANIONGAP  --  12 11     Hematology Recent Labs  Lab 06/19/18 1109 06/20/18 2254 06/21/18 0532  WBC 12.1* 12.5* 9.5  RBC 4.78 5.04 4.86  HGB 13.5 14.1 13.6  HCT 41.3 45.4 42.6  MCV 86.4 90.1 87.7  MCH 28.2 28.0 28.0  MCHC 32.7 31.1 31.9  RDW 14.2 15.3 15.3  PLT 303 310 282    Cardiac Enzymes Recent Labs  Lab 06/20/18 1644 06/20/18 2254 06/21/18 0532  TROPONINI 1.41* 1.03* 0.65*   No results for input(s): TROPIPOC in the last 168 hours.   BNP Recent Labs  Lab 06/20/18 2254  BNP 676.6*     DDimer No results for input(s): DDIMER in the last 168 hours.   Radiology    Ct Angio Chest Pe W And/or Wo Contrast  Result Date: 06/20/2018 CLINICAL DATA:  Chest pain EXAM: CT ANGIOGRAPHY CHEST WITH CONTRAST TECHNIQUE: Multidetector CT imaging of the chest was performed using the standard protocol during bolus administration of intravenous contrast. Multiplanar CT image reconstructions and MIPs were obtained to evaluate the vascular anatomy. CONTRAST:  74mL OMNIPAQUE IOHEXOL 350 MG/ML SOLN COMPARISON:  07/21/2016 FINDINGS: Cardiovascular: Cardiomegaly. Aorta is normal caliber. No filling defects in the pulmonary arteries to suggest pulmonary emboli. There is partial anomalous pulmonary venous return in the left upper lobe with the left upper lobe pulmonary vein draining into the left innominate vein. Mediastinum/Nodes: No mediastinal, hilar, or axillary adenopathy. Trachea and esophagus are  unremarkable. Thyroid unremarkable. Lungs/Pleura: Mild vascular congestion. No confluent airspace opacities or effusions. Minimal bibasilar atelectasis. Upper Abdomen: Imaging into the upper abdomen shows no acute findings. Musculoskeletal: Chest wall soft tissues are unremarkable. No acute bony abnormality. Review of the MIP images confirms the above findings. IMPRESSION: Cardiomegaly, vascular congestion. No evidence of pulmonary embolus. Partial anomalous pulmonary venous return in the left upper lobe. Electronically Signed   By: Rolm Baptise M.D.   On: 06/20/2018 23:40   Dg Chest Port 1 View  Result Date: 06/20/2018 CLINICAL DATA:  Short of breath EXAM: PORTABLE CHEST 1 VIEW COMPARISON:  CT chest 07/21/2016 FINDINGS: Mild cardiomegaly with vascular congestion. No acute consolidation or effusion. No pneumothorax. IMPRESSION: Cardiomegaly with mild central vascular congestion Electronically Signed  By: Donavan Foil M.D.   On: 06/20/2018 22:27   Cardiac Studies   Pending echo and cath  Patient Profile     Travis Huff is a 58 y.o. male w/ anxiety, HTN, obesity, and multiple PE's (on apixaban and s/p IVC filter prior to orthopedic surgery) who presents with NSTEMI.   Assessment & Plan    1. NSTEMI - Symptoms could be atypical for ACS. Also had palpitations.  - CTA iof chest without PE. - Troponin trend of 1.41>>>1.03>>0.65.  - He missed his Eliquis 2.5mg  yesterday morning, but took last dose at 8:30pm 06/20/18. - Currently on heparin. Chest pain free since admit. ? His symptoms related to tachycardia.  - Keep NPO but will give clear liquid breakfast this morning. - Dr. Tamala Julian to see later today - Continue ASA, statin and BB  2. Hx of PE - Eliquis on hold as above  3. HTN - BP stable on current medications.   For questions or updates, please contact [facility] Please consult [email or web address] for contact info under        SignedLeanor Kail, PA  06/21/2018, 7:52 AM

## 2018-06-21 NOTE — Progress Notes (Signed)
ANTICOAGULATION CONSULT NOTE - Follow-Up Consult  Pharmacy Consult:  Heparin Indication: chest pain/ACS  Allergies  Allergen Reactions  . Ketamine     hallucinations  . Morphine And Related Itching    Patient Measurements: Height: 6' (182.9 cm) Weight: (!) 315 lb 11.2 oz (143.2 kg) IBW/kg (Calculated) : 77.6 Heparin Dosing Weight: 108kg  Vital Signs: Temp: 98.3 F (36.8 C) (06/19 0744) Temp Source: Oral (06/19 0744) BP: 118/86 (06/19 0744) Pulse Rate: 69 (06/19 0345)  Labs: Recent Labs    06/19/18 1109 06/20/18 1644 06/20/18 2254 06/20/18 2318 06/21/18 0532 06/21/18 0727  HGB 13.5  --  14.1  --  13.6  --   HCT 41.3  --  45.4  --  42.6  --   PLT 303  --  310  --  282  --   APTT  --   --   --  32  --  47*  HEPARINUNFRC  --   --   --  1.46*  --   --   CREATININE 0.97  --  0.99  --  0.99  --   TROPONINI  --  1.41* 1.03*  --  0.65*  --     Estimated Creatinine Clearance: 120.9 mL/min (by C-G formula based on SCr of 0.99 mg/dL).   Assessment: 25 yom presented to the ED from his PCP office with an elevated troponin started on IV heparin. Dosing with aPTT given recent apixaban use (hx PE). Initial aPTT is subtherapeutic at 45 seconds, CBC is stable this morning, no S/Sx bleeding per RN.  Goal of Therapy:  Heparin level 0.3-0.7 units/ml aPTT 66-102 seconds Monitor platelets by anticoagulation protocol: Yes   Plan:  Increase heparin to 1800 units/hr Recheck aPTT in 8hr Daily aPTT, heparin level, CBC   Arrie Senate, PharmD, BCPS Clinical Pharmacist [phone] Please check AMION for all [place] numbers 06/21/2018

## 2018-06-21 NOTE — Interval H&P Note (Signed)
Cath Lab Visit (complete for each Cath Lab visit)  Clinical Evaluation Leading to the Procedure:   ACS: Yes.    Non-ACS:    Anginal Classification: CCS IV  Anti-ischemic medical therapy: Minimal Therapy (1 class of medications)  Non-Invasive Test Results: No non-invasive testing performed  Prior CABG: No previous CABG      History and Physical Interval Note:  06/21/2018 2:22 PM  Travis Huff  has presented today for surgery, with the diagnosis of non stemi.  The various methods of treatment have been discussed with the patient and family. After consideration of risks, benefits and other options for treatment, the patient has consented to  Procedure(s): LEFT HEART CATH AND CORONARY ANGIOGRAPHY (N/A) as a surgical intervention.  The patient's history has been reviewed, patient examined, no change in status, stable for surgery.  I have reviewed the patient's chart and labs.  Questions were answered to the patient's satisfaction.     Larae Grooms

## 2018-06-21 NOTE — Progress Notes (Signed)
Cardiac Catheterization Follow-up Note  Reviewed digital images with Dr. Irish Lack and Dr. Angelena Form.: Diagnostic Dominance: Right      The patient appears to have SCAD (spontaneous coronary artery dissection) in the distal to apical LAD with TIMI grade II flow.  Presentation is atypical for SCAD in that there was no chest pain only intermittent palpitations that have been increasing in frequency for greater than 2 weeks.  The episode that precipitated admission had accompanying near syncope as witnessed by his wife [name] who is a CCU nurse.  According to her the heart rate was fast and rapid with irregularity.  He was diaphoretic and weak.  The episode resolved spontaneously.  There was no complaint of chest pain.  Troponin I has gradual declined since admission: 1.41--> 1.03 --> 0.65.   2D Doppler Echocardiogram 06/21/2018:  1. Severe hypokinesis of the left ventricular, mid-apical anterolateral wall. (Inferobasal segment also appears hypokinetic to me)  2. The left ventricle has mild-moderately reduced systolic function, with an ejection fraction of 40-45%. The cavity size was mildly dilated. There is mild concentric left ventricular hypertrophy. Left ventricular diastolic Doppler parameters are  indeterminate. Left ventricular diffuse hypokinesis.  3. No evidence of mitral valve stenosis.  4. The tricuspid valve is grossly normal.  5. The aortic valve is grossly normal. No stenosis of the aortic valve.  6. The aortic root, aortic arch and ascending aorta are normal in size and structure.   Telemetry is not demonstrating any significant supraventricular or ventricular arrhythmias.  IMPRESSION: 1. SCAD 2. Palpitations, r/o ischemic arrhythmia 3. Labile BP elevation  RECOMMENDATION: 1. Hold Eliquis anticoagulation for 7 days, then resume.  Start ambulation in 12 to 24 hours.  Will use subcu Lovenox for DVT prophylaxis 2. Continue monitoring while in hospital at least an additional  36 hours to look for evidence of ischemic arrhythmia or some other hemodynamically compromising rhythm.  If no arrhythmia identified, at a minimum he will need to have a 30-day monitor applied as soon as possible. 3. Therapy of blood pressure as needed.  Plan to resume the patient's typical home regimen of beta-blocker and angiotensin-converting enzyme inhibitor.

## 2018-06-22 DIAGNOSIS — I2542 Coronary artery dissection: Secondary | ICD-10-CM

## 2018-06-22 DIAGNOSIS — I255 Ischemic cardiomyopathy: Secondary | ICD-10-CM

## 2018-06-22 LAB — CBC
HCT: 43.6 % (ref 39.0–52.0)
Hemoglobin: 14 g/dL (ref 13.0–17.0)
MCH: 27.9 pg (ref 26.0–34.0)
MCHC: 32.1 g/dL (ref 30.0–36.0)
MCV: 86.9 fL (ref 80.0–100.0)
Platelets: 300 10*3/uL (ref 150–400)
RBC: 5.02 MIL/uL (ref 4.22–5.81)
RDW: 15.6 % — ABNORMAL HIGH (ref 11.5–15.5)
WBC: 9.1 10*3/uL (ref 4.0–10.5)
nRBC: 0 % (ref 0.0–0.2)

## 2018-06-22 MED ORDER — NITROGLYCERIN 0.4 MG SL SUBL: 0.4000 mg | SUBLINGUAL_TABLET | SUBLINGUAL | 3 refills | Status: DC | PRN

## 2018-06-22 MED ORDER — ASPIRIN 81 MG PO TBEC: 81.0000 mg | DELAYED_RELEASE_TABLET | Freq: Every day | ORAL | 3 refills | Status: DC

## 2018-06-22 MED ORDER — LISINOPRIL 20 MG PO TABS: 20.0000 mg | ORAL_TABLET | Freq: Every day | ORAL | 3 refills | Status: DC

## 2018-06-22 MED ORDER — METOPROLOL SUCCINATE ER 100 MG PO TB24: 100.0000 mg | ORAL_TABLET | Freq: Every day | ORAL | 3 refills | Status: DC

## 2018-06-22 MED ORDER — ATORVASTATIN CALCIUM 80 MG PO TABS: 80.0000 mg | ORAL_TABLET | Freq: Every day | ORAL | 3 refills | Status: DC

## 2018-06-22 NOTE — Discharge Summary (Addendum)
Discharge Summary    Patient ID: Travis Huff MRN: [id number]; DOB: 15-Jun-1960  Admit date: 06/20/2018 Discharge date: 06/22/2018  Primary Care Provider: Silverio Decamp, MD  Primary Cardiologist: Sinclair Grooms, MD  Primary Electrophysiologist:  None   Discharge Diagnoses    Principal Problem:   NSTEMI (non-ST elevated myocardial infarction) [facility]) Active Problems:   Spontaneous dissection of coronary artery   Benign essential hypertension   Ischemic cardiomyopathy   Morbid obesity ([place])   Mixed hyperlipidemia   History of DVT (deep vein thrombosis)   Allergies Allergies  Allergen Reactions   Ketamine     hallucinations   Morphine And Related Itching    Diagnostic Studies/Procedures    LHC  Dist LAD lesion is 75% stenosed. 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  There is mild left ventricular systolic dysfunction.  The left ventricular ejection fraction is 35-45% by visual estimate.  There is no aortic valve stenosis. No atherosclerotic CAD.  SCAD at the apical LAD.  Hold anti coagulation for now.  Continue medical therapy.   Coronary Diagrams  Diagnostic Dominance: Right      TTE 06/21/2018  1. Severe hypokinesis of the left ventricular, mid-apical anterolateral wall.  2. The left ventricle has mild-moderately reduced systolic function, with an ejection fraction of 40-45%. The cavity size was mildly dilated. There is mild concentric left ventricular hypertrophy. Left ventricular diastolic Doppler parameters are  indeterminate. Left ventricular diffuse hypokinesis.  3. No evidence of mitral valve stenosis.  4. The tricuspid valve is grossly normal.  5. The aortic valve is grossly normal. No stenosis of the aortic valve.  6. The aortic root, aortic arch and ascending aorta are normal in size and structure. _____________   History of Present Illness     [name] is a 58 yo male with h/o anxiety, HTN, obesity, and  PE (on apixaban and s/p IVC filter prior to orthopedic surgery).  Other past medical history includes recurrent DVT with one episode of PE.  He had a vena caval umbrella placed prior to hip surgery.  The umbrella has not been removed.  He has been evaluated by hematology and has no thrombophilia identified and they have recommended continuing anticoagulation long-term.  Most recently, he has been on apixaban 5 mg twice daily with new recommendation by hematology for 2.5 mg twice daily.  He reportedly does not know his family history, due to being adopted.  On 06/20/2018, he reported to his PCP after he experienced a near syncopal episode earlier that morning and on his way to the restroom. He reportedly had been having episodes similar to the one on 6/18 for the last 6 months and at an increasing frequency. On 06/20/2018, he stood up to go to the restroom then felt lightheaded with tunnel vision. He fell to his knees but did not completely lose consciousness or hit his head.  He denied associated chest pain, but he did note his heart was racing. He also reported associated diaphoresis, nausea / dry heaving, and SOB / DOE, made worse with exertion.    He presented to his PCP's office that same day. Per EMR, he then became more symptomatic with palpitations and diaphoresis.  EKG reportedly showed sinus tachycardia with rare PVC/PAC.  An urgent [facility] telehealth visit was made by his PCP for the next morning (6/19) with Dr. Harrell Gave at 8:30 AM.  He was informed by his PCP that they would draw a troponin, and in the meantime  he should present to the emergency department if he experienced any chest pain or syncope. He was called later that evening with lab results of elevated troponin at 1.41 and told to proceed to the emergency department at that time.  Hospital Course     Consultants: None   He presented to [facility] that evening and per recommendation of his PCP due to elevated troponin  in the setting of near syncopal event earlier that day. In the ED, patient denied CP but was very diaphoretic on exam. He was seen by Cardiology and admitted to the hospital for NSTEMI. He continued to deny chest pain but did note some light headedness and decreased palpitations. CXR showed cardiomegaly with vascular congestion. CTA negative for PE and showed partial anomalous pulmonary venous return of LUL. EKG NSR 85bpm, IVCD, RAD, no acute ST/T wave changes. Repeat troponin was 1.03 and down-trending from his original troponin of 1.41. Other labs significant for BNP 676.6, Cr 0.99, BUN 15, K 4.2, Na 137, Mg 2.0, WBC 12.5, Hgb 14.1.   On 6/19, he had an echo that showed EF 40-45%. It was also noted he had severe hypokinesis of the LV and mid-apical anterolateral wall. Mild LV dilation and LVH were noted. Given these findings and his elevated troponin, recommendation was that he undergo left heart catheterization. The risks were discussed with the patient and wife (on the phone) in detail. It was noted that recent Eliquis therapy would increase the risk of therapy, as the patient noted that his last dose of apixaban 5 mg was 18 hours prior to his admission and that the dose prior to that was missed.  After discussion with the patient, the patient agreed to proceed.   On 6/19, he underwent LHC via the right radial artery which showed 75% stenosis of the distal LAD that appeared to be spontaneous coronary artery dissection at the apical LAD.  It was also noted there was mild left ventricular systolic dysfunction with LVEF estimated 35 to 45% by visual estimate.  No aortic valve stenosis was noted.  No atherosclerotic CAD was noted. He was kept for overnight monitoring following cardiac catheterization.   Today, 06/22/2018, the MD has seen and examined the patient today and feels patient is stable for discharge. He has ambulate well with cane and 1 assist due to slight unsteady gait with h/o neuropathy. No CP. R  radial cardiac cath site has been examined and without signs of swelling or infection. His medication plan has been discussed with him and notes indicating at discharge to hold anticoagulation with Eliquis for 7 days per MD and to avoid propagation of intramural hematoma. He will continue medical management with ASA 81mg  daily, Toprol XL 100mg  daily, Lisinopril 20mg  daily, and statin therapy with atorvastatin 80mg  daily. HCTZ was stopped as it ws thought that this may be exacerbating the vagal symptoms. It was also noted that, while no significant arrhythmias were identified during this admission, the patient will need long-term monitoring as an outpatient to identify any possible arrhythmia not captured while hospitalized. He should also consider an outpatient sleep study to evaluate him for possible sleep apnea, given changes viewed on EKG and wife's report of daytime's dizziness. Staff message has been sent to scheduling for follow-up with his primary cardiologist within 1 week of discharge. He is set for cardiac rehab.  _____________  Discharge Vitals Blood pressure 111/89, pulse 77, temperature 98.3 F (36.8 C), temperature source Oral, resp. rate 20, height 6' (1.829 m),  weight (!) 143.2 kg, SpO2 99 %.  Filed Weights   06/20/18 2159 06/20/18 2356  Weight: 136.1 kg (!) 143.2 kg    Labs & Radiologic Studies    CBC Recent Labs    06/20/18 2254 06/21/18 0532 06/22/18 0234  WBC 12.5* 9.5 9.1  NEUTROABS 9.3*  --   --   HGB 14.1 13.6 14.0  HCT 45.4 42.6 43.6  MCV 90.1 87.7 86.9  PLT 310 282 299   Basic Metabolic Panel Recent Labs    06/20/18 2254 06/21/18 0532  NA 137 138  K 4.2 4.6  CL 100 99  CO2 25 28  GLUCOSE 92 115*  BUN 15 13  CREATININE 0.99 0.99  CALCIUM 9.5 9.6  MG 2.0  --    Liver Function Tests No results for input(s): AST, ALT, ALKPHOS, BILITOT, PROT, ALBUMIN in the last 72 hours. No results for input(s): LIPASE, AMYLASE in the last 72 hours. Cardiac  Enzymes Recent Labs    06/20/18 1644 06/20/18 2254 06/21/18 0532  TROPONINI 1.41* 1.03* 0.65*   BNP Invalid input(s): POCBNP D-Dimer No results for input(s): DDIMER in the last 72 hours. Hemoglobin A1C No results for input(s): HGBA1C in the last 72 hours. Fasting Lipid Panel No results for input(s): CHOL, HDL, LDLCALC, TRIG, CHOLHDL, LDLDIRECT in the last 72 hours. Thyroid Function Tests Recent Labs    06/20/18 2349  TSH 4.369   _____________  Ct Angio Chest Pe W And/or Wo Contrast  Result Date: 06/20/2018 CLINICAL DATA:  Chest pain EXAM: CT ANGIOGRAPHY CHEST WITH CONTRAST TECHNIQUE: Multidetector CT imaging of the chest was performed using the standard protocol during bolus administration of intravenous contrast. Multiplanar CT image reconstructions and MIPs were obtained to evaluate the vascular anatomy. CONTRAST:  53mL OMNIPAQUE IOHEXOL 350 MG/ML SOLN COMPARISON:  07/21/2016 FINDINGS: Cardiovascular: Cardiomegaly. Aorta is normal caliber. No filling defects in the pulmonary arteries to suggest pulmonary emboli. There is partial anomalous pulmonary venous return in the left upper lobe with the left upper lobe pulmonary vein draining into the left innominate vein. Mediastinum/Nodes: No mediastinal, hilar, or axillary adenopathy. Trachea and esophagus are unremarkable. Thyroid unremarkable. Lungs/Pleura: Mild vascular congestion. No confluent airspace opacities or effusions. Minimal bibasilar atelectasis. Upper Abdomen: Imaging into the upper abdomen shows no acute findings. Musculoskeletal: Chest wall soft tissues are unremarkable. No acute bony abnormality. Review of the MIP images confirms the above findings. IMPRESSION: Cardiomegaly, vascular congestion. No evidence of pulmonary embolus. Partial anomalous pulmonary venous return in the left upper lobe. Electronically Signed   By: Rolm Baptise M.D.   On: 06/20/2018 23:40   Dg Chest Port 1 View  Result Date: 06/20/2018 CLINICAL DATA:   Short of breath EXAM: PORTABLE CHEST 1 VIEW COMPARISON:  CT chest 07/21/2016 FINDINGS: Mild cardiomegaly with vascular congestion. No acute consolidation or effusion. No pneumothorax. IMPRESSION: Cardiomegaly with mild central vascular congestion Electronically Signed   By: Donavan Foil M.D.   On: 06/20/2018 22:27   Disposition   Pt is being discharged home today in good condition.  Patient has been examined by MD as indicated in today's progress note.  Follow-up Plans & Appointments    Follow-up Information    Belva Crome, MD Follow up.   Specialty: Cardiology Why: You will receive a call for follow-up within the next two weeks.  Contact information: [address] [address] [phone]          Discharge Instructions    Amb Referral to  Cardiac Rehabilitation   Complete by: As directed    Diagnosis: NSTEMI   After initial evaluation and assessments completed: Virtual Based Care may be provided alone or in conjunction with Phase 2 Cardiac Rehab based on patient barriers.: Yes   Call MD for:  difficulty breathing, headache or visual disturbances   Complete by: As directed    Call MD for:  extreme fatigue   Complete by: As directed    Call MD for:  hives   Complete by: As directed    Call MD for:  persistant dizziness or light-headedness   Complete by: As directed    Call MD for:  persistant nausea and vomiting   Complete by: As directed    Call MD for:  redness, tenderness, or signs of infection (pain, swelling, redness, odor or green/yellow discharge around incision site)   Complete by: As directed    Call MD for:  severe uncontrolled pain   Complete by: As directed    Call MD for:  temperature >100.4   Complete by: As directed    Diet - low sodium heart healthy   Complete by: As directed    Increase activity slowly   Complete by: As directed       Discharge Medications   Allergies as of 06/22/2018      Reactions   Ketamine     hallucinations   Morphine And Related Itching      Medication List    STOP taking these medications   atenolol 100 MG tablet Commonly known as: TENORMIN   lisinopril-hydrochlorothiazide 20-25 MG tablet Commonly known as: ZESTORETIC     TAKE these medications   AMBULATORY NON FORMULARY MEDICATION Knee-high, medium compression, graduated compression stockings. Apply to lower extremities. Medium circ, long length   AMBULATORY NON FORMULARY MEDICATION Knee-high, medium compression, graduated compression stockings. Apply to lower extremities. [email or web address], Zippered Compression Stockings, medium circ, long length   apixaban 2.5 MG Tabs tablet Commonly known as: Eliquis Take 1 tablet (2.5 mg total) by mouth 2 (two) times daily. Notes to patient: Please hold this medication for the next 7 days. You may restart this medication 06/29/2018.   aspirin 81 MG EC tablet Take 1 tablet (81 mg total) by mouth daily. Start taking on: June 23, 2018   atorvastatin 80 MG tablet Commonly known as: LIPITOR Take 1 tablet (80 mg total) by mouth daily at 6 PM.   calcium carbonate 500 MG chewable tablet Commonly known as: TUMS - dosed in mg elemental calcium Chew 3-4 tablets by mouth 2 (two) times daily as needed (stomach cramps).   clobetasol ointment 0.05 % Commonly known as: TEMOVATE Apply 1 application topically 2 (two) times daily. and cover with cotton socks   clotrimazole-betamethasone cream Commonly known as: LOTRISONE Apply 1 application topically 2 (two) times daily.   diazepam 5 MG tablet Commonly known as: VALIUM Take 1 tablet (5 mg total) by mouth at bedtime as needed for muscle spasms.   docusate sodium 100 MG capsule Commonly known as: COLACE Take 1 capsule (100 mg total) by mouth 2 (two) times daily.   DULoxetine 60 MG capsule Commonly known as: CYMBALTA Take 1 capsule (60 mg total) by mouth at bedtime.   HYDROmorphone 2 MG tablet Commonly known as:  DILAUDID Take 1-2 tablets (2-4 mg total) by mouth every 4 (four) hours as needed for severe pain.   lisinopril 20 MG tablet Commonly known as: ZESTRIL Take 1 tablet (20 mg total) by mouth daily. Start taking  on: June 23, 2018   metoprolol succinate 100 MG 24 hr tablet Commonly known as: TOPROL-XL Take 1 tablet (100 mg total) by mouth daily. Take with or immediately following a meal. Start taking on: June 23, 2018   nitroGLYCERIN 0.4 MG SL tablet Commonly known as: NITROSTAT Place 1 tablet (0.4 mg total) under the tongue every 5 (five) minutes x 3 doses as needed for chest pain. If taking third dose, call 911.   ondansetron 4 MG tablet Commonly known as: ZOFRAN Take 1 tablet (4 mg total) by mouth every 6 (six) hours as needed for nausea.   polyethylene glycol 17 g packet Commonly known as: MIRALAX / GLYCOLAX Take 17 g by mouth daily as needed for mild constipation.   senna 8.6 MG Tabs tablet Commonly known as: SENOKOT Take 2 tablets (17.2 mg total) by mouth at bedtime.   sildenafil 100 MG tablet Commonly known as: VIAGRA Take 1 tablet (100 mg total) by mouth daily as needed for erectile dysfunction.   zolpidem 5 MG tablet Commonly known as: AMBIEN Take 1 tablet (5 mg total) by mouth at bedtime as needed for sleep.        Acute coronary syndrome (MI, NSTEMI, STEMI, etc) this admission?: Yes.     AHA/ACC Clinical Performance & Quality Measures: 1. Aspirin prescribed? - Yes 2. ADP Receptor Inhibitor (Plavix/Clopidogrel, Brilinta/Ticagrelor or Effient/Prasugrel) prescribed (includes medically managed patients)? - No - No PTA ASA, Eliquis 3. Beta Blocker prescribed? - Yes 4. High Intensity Statin (Lipitor 40-80mg  or Crestor 20-40mg ) prescribed? - Yes 5. EF assessed during THIS hospitalization? - Yes 6. For EF <40%, was ACEI/ARB prescribed? - Not Applicable (EF >/= 85%) 7. For EF <40%, Aldosterone Antagonist (Spironolactone or Eplerenone) prescribed? - Not Applicable (EF >/=  63%) 8. Cardiac Rehab Phase II ordered (Included Medically managed Patients)? - No - Cardiac Phase I ordered and to be completed per COVID-19 current precautions     Outstanding Labs/Studies   Staff message will include request for Zio monitor for 30 days. No additional labs needed at this time. Will follow-up with primary cardiologist within 1 week, which will be arranged by scheduling.   Duration of Discharge Encounter   Greater than 30 minutes including physician time.  Signed, Arvil Chaco, PA-C 06/22/2018, 1:44 PM    Personally seen and examined. Agree with above.   Primary Cardiologist: Sinclair Grooms, MD   Subjective   Feels great, no palpitations, ambulated well no chest pain.  Inpatient Medications    Scheduled Meds:  aspirin EC  81 mg Oral Daily   atorvastatin  80 mg Oral q1800   docusate sodium  100 mg Oral BID   DULoxetine  60 mg Oral QHS   enoxaparin (LOVENOX) injection  40 mg Subcutaneous Q24H   lisinopril  20 mg Oral Daily   And   hydrochlorothiazide  25 mg Oral Daily   metoprolol succinate  100 mg Oral Daily   senna  2 tablet Oral QHS   sodium chloride flush  3 mL Intravenous Q12H   sodium chloride flush  3 mL Intravenous Q12H   Continuous Infusions:  sodium chloride     PRN Meds: sodium chloride, acetaminophen, calcium carbonate, diazepam, nitroGLYCERIN, ondansetron (ZOFRAN) IV, sodium chloride flush, zolpidem   Vital Signs          Vitals:   06/22/18 0300 06/22/18 0400 06/22/18 0725 06/22/18 1017  BP: 116/75 119/82 110/74 111/89  Pulse:  68  77  Resp: 20 19  20  Temp:  97.8 F (36.6 C) 98.3 F (36.8 C) 98.3 F (36.8 C)  TempSrc:  Oral Oral Oral  SpO2:  99%  99%  Weight:      Height:        Intake/Output Summary (Last 24 hours) at 06/22/2018 1132 Last data filed at 06/22/2018 0725    Gross per 24 hour  Intake 1737.67 ml  Output 2125 ml  Net -387.33 ml   Last 3 Weights 06/20/2018  06/20/2018 06/20/2018  Weight (lbs) 315 lb 11.2 oz 300 lb 315 lb  Weight (kg) 143.2 kg 136.079 kg 142.883 kg      Telemetry    Normal sinus rhythm, no PVCs, no adverse arrhythmias- Personally Reviewed  ECG    Normal sinus rhythm- Personally Reviewed  Physical Exam   GEN:No acute distress.   Neck:No JVD Cardiac:RRR, no murmurs, rubs, or gallops.  Cardiac catheterization site normal. Respiratory:Clear to auscultation bilaterally. MW:UXLK, nontender, non-distended  MS:No edema; No deformity. Neuro:Nonfocal  Psych: Normal affect   Labs    Chemistry Last Labs        Recent Labs  Lab 06/19/18 1109 06/20/18 2254 06/21/18 0532  NA 141 137 138  K 4.6 4.2 4.6  CL 102 100 99  CO2 28 25 28   GLUCOSE 111* 92 115*  BUN 22 15 13   CREATININE 0.97 0.99 0.99  CALCIUM 9.6 9.5 9.6  PROT 6.9  --   --   AST 32  --   --   ALT 32  --   --   BILITOT 0.3  --   --   GFRNONAA 86 >60 >60  GFRAA 100 >60 >60  ANIONGAP  --  12 11       Hematology Last Labs        Recent Labs  Lab 06/20/18 2254 06/21/18 0532 06/22/18 0234  WBC 12.5* 9.5 9.1  RBC 5.04 4.86 5.02  HGB 14.1 13.6 14.0  HCT 45.4 42.6 43.6  MCV 90.1 87.7 86.9  MCH 28.0 28.0 27.9  MCHC 31.1 31.9 32.1  RDW 15.3 15.3 15.6*  PLT 310 282 300      Cardiac Enzymes Last Labs        Recent Labs  Lab 06/20/18 1644 06/20/18 2254 06/21/18 0532  TROPONINI 1.41* 1.03* 0.65*      Last Labs   No results for input(s): TROPIPOC in the last 168 hours.     BNP Last Labs      Recent Labs  Lab 06/20/18 2254  BNP 676.6*       DDimer  Last Labs   No results for input(s): DDIMER in the last 168 hours.     Radiology     Imaging Results (Last 48 hours)  Ct Angio Chest Pe W And/or Wo Contrast  Result Date: 06/20/2018 CLINICAL DATA:  Chest pain EXAM: CT ANGIOGRAPHY CHEST WITH CONTRAST TECHNIQUE: Multidetector CT imaging of the chest was performed using the standard protocol during bolus  administration of intravenous contrast. Multiplanar CT image reconstructions and MIPs were obtained to evaluate the vascular anatomy. CONTRAST:  1mL OMNIPAQUE IOHEXOL 350 MG/ML SOLN COMPARISON:  07/21/2016 FINDINGS: Cardiovascular: Cardiomegaly. Aorta is normal caliber. No filling defects in the pulmonary arteries to suggest pulmonary emboli. There is partial anomalous pulmonary venous return in the left upper lobe with the left upper lobe pulmonary vein draining into the left innominate vein. Mediastinum/Nodes: No mediastinal, hilar, or axillary adenopathy. Trachea and esophagus are unremarkable. Thyroid unremarkable. Lungs/Pleura: Mild vascular congestion.  No confluent airspace opacities or effusions. Minimal bibasilar atelectasis. Upper Abdomen: Imaging into the upper abdomen shows no acute findings. Musculoskeletal: Chest wall soft tissues are unremarkable. No acute bony abnormality. Review of the MIP images confirms the above findings. IMPRESSION: Cardiomegaly, vascular congestion. No evidence of pulmonary embolus. Partial anomalous pulmonary venous return in the left upper lobe. Electronically Signed   By: Rolm Baptise M.D.   On: 06/20/2018 23:40   Dg Chest Port 1 View  Result Date: 06/20/2018 CLINICAL DATA:  Short of breath EXAM: PORTABLE CHEST 1 VIEW COMPARISON:  CT chest 07/21/2016 FINDINGS: Mild cardiomegaly with vascular congestion. No acute consolidation or effusion. No pneumothorax. IMPRESSION: Cardiomegaly with mild central vascular congestion Electronically Signed   By: Donavan Foil M.D.   On: 06/20/2018 22:27     Cardiac Studies   Cardiac catheterization 06/21/2018:  Dist LAD lesion is 75% stenosed. 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  There is mild left ventricular systolic dysfunction.  The left ventricular ejection fraction is 35-45% by visual estimate.  There is no aortic valve stenosis.  No atherosclerotic CAD.   SCAD at the apical LAD.    Hold anti coagulation for now. Continue medical therapy.   Echocardiogram 06/21/2018:  1. Severe hypokinesis of the left ventricular, mid-apical anterolateral wall. 2. The left ventricle has mild-moderately reduced systolic function, with an ejection fraction of 40-45%. The cavity size was mildly dilated. There is mild concentric left ventricular hypertrophy. Left ventricular diastolic Doppler parameters are  indeterminate. Left ventricular diffuse hypokinesis. 3. No evidence of mitral valve stenosis. 4. The tricuspid valve is grossly normal. 5. The aortic valve is grossly normal. No stenosis of the aortic valve. 6. The aortic root, aortic arch and ascending aorta are normal in size and structure.    Patient Profile     58 y.o. 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  Troponin peak 1.4 -Supportive care, atorvastatin 80, aspirin 81 -No arrhythmias.  Ambulated well with cane and 1 assist.  Has neuropathy and slightly unsteady gait but did well.  No chest pain.  Appreciate cardiac rehab. -Personally reviewed telemetry and there were no adverse arrhythmias.  Ischemic cardiomyopathy -EF 40-45%.  Mild reduction in the setting of his scad. - Lisinopril 20, Toprol 100.  Appears fluid stable.  Chronic anticoagulation for recurrent DVT with one episode of PE, IVC filter in place prior to previous hip surgery. -Holding Eliquis for the next 7 days.  No evidence of arrhythmias.  Recommendation was to have a 30-day event monitor.  Had increasing frequency of palpitations.  These been going on for about 2 years.  Occasionally he will feel faint.  One time when standing to urinate at the bathroom he stated that he had to go to his knees.  One time when  walking to his car from work he felt a sensation of palpitation, felt it above his eyes he states.  Previously PVC has been diagnosed.  [name], his wife was a Marine scientist on 2 heart for 30 years, now works in the [employer].  - No clotting disorder from heme-onc.  Essential hypertension - I will stop his hydrochlorothiazide.  This is being used in combination with his lisinopril.  I wonder if some of the sensations that he is feeling are perhaps exacerbated by vagal type  episodes.  I do not want him to be overly dehydrated or have his blood pressure get overly low.  Blood pressure here has been low normal.  Morbid obesity  - continue weight loss strategy. Hard with hip and back.  Currently doing well.  Seems stable for discharge. We will go ahead and obtain a 30-day event monitor for him and 7-day follow-up transition of care.  Obviously if any worrisome symptoms develop, he knows to seek medical attention.  Spoke to his wife who was a long-term cardiac nurse on the phone.  For questions or updates, please contact [facility] Please consult [email or web address] for contact info under        Signed, Candee Furbish, MD

## 2018-06-22 NOTE — Discharge Instructions (Signed)
You have received care from [facility] during this hospital stay and we look forward to continuing to provide you with excellent care in our office settings after you've left the hospital.  In order to assure a smoother transition to home following your discharge from the hospital, we will likely have you see one of our nurse practitioners or physician assistants within a few weeks of discharge.  Our advanced practice providers work closely with your physician in order to address all of your heart's needs in a timely manner.  More information about all of our providers may be found here: [email or web address]   Please plan to bring all of your prescriptions to your follow-up appointment and don't hesitate to contact us with questions or concerns.  -------------------------------------- Keep incision clean and dry for 10 days. No driving for 2 days.  You can remove outer dressing tomorrow. Call the office for redness, drainage, swelling, or fever.  CATHETERIZATION: NO HEAVY LIFTING X 4 WEEKS. NO SEXUAL ACTIVITY X 4 WEEKS. NO SOAKING BATHS, HOT TUBS, POOLS, ETC., X 7 DAYS.  Please hold your Eliquis for 7 days. You may restart the Eliquis on 06/29/2018.   Radial Site Care Refer to this sheet in the next few weeks. These instructions provide you with information on caring for yourself after your procedure. Your caregiver may also give you more specific instructions. Your treatment has been planned according to current medical practices, but problems sometimes occur. Call your caregiver if you have any problems or questions after your procedure. HOME CARE INSTRUCTIONS  You may shower the day after the procedure. Remove the bandage (dressing) and gently wash the site with plain soap and water. Gently pat the site dry.   Do not apply powder or lotion to the site.   Do not submerge the affected site in water for 3  to 5 days.   Inspect the site at least twice daily.   Do not flex or bend the affected arm for 24 hours.   No lifting over 5 pounds (2.3 kg) for 5 days after your procedure.  What to expect:  Any bruising will usually fade within 1 to 2 weeks.   Blood that collects in the tissue (hematoma) may be painful to the touch. It should usually decrease in size and tenderness within 1 to 2 weeks.  SEEK IMMEDIATE MEDICAL CARE IF:  You have unusual pain at the radial site.   You have redness, warmth, swelling, or pain at the radial site.   You have drainage (other than a small amount of blood on the dressing).   You have chills.   You have a fever or persistent symptoms for more than 72 hours.   You have a fever and your symptoms suddenly get worse.   Your arm becomes pale, cool, tingly, or numb.   You have heavy bleeding from the site. Hold pressure on the site.       10 Habits of Highly Healthy [place] wants to help you get well and stay well.  Live a longer, healthier life by practicing healthy habits every day.  1.  Visit your primary care provider regularly. 2.  Make time for family and friends.  Healthy relationships are important. 3.  Take medications as directed by your provider. 4.  Maintain a healthy weight and a trim waistline. 5.  Eat healthy meals and snacks, rich in fruits, vegetables, whole grains, and lean proteins. 6.  Get moving  every day - aim for 150 minutes of moderate physical activity each week. 7.  Don't smoke. 8.  Avoid alcohol or drink in moderation. 9.  Manage stress through meditation or mindful relaxation. 10.  Get seven to nine hours of quality sleep each night.  Want more information on healthy habits?  To learn more about these and other healthy habits, visit SecuritiesCard.it.   IT IS IMPORTANT TO LET YOUR DOCTOR KNOW EARLY ON IF YOU ARE HAVING SYMPTOMS SO WE CAN HELP YOU!    Information on my medicine - ELIQUIS  (apixaban) Why was Eliquis prescribed for you? Eliquis was prescribed to treat blood clots that may have been found in the veins of your legs (deep vein thrombosis) or in your lungs (pulmonary embolism) and to reduce the risk of them occurring again.  What do You need to know about Eliquis ?  Eliquis may be taken with or without food.   Try to take the dose about the same time in the morning and in the evening. If you have difficulty swallowing the tablet whole please discuss with your pharmacist how to take the medication safely.  Take Eliquis exactly as prescribed and DO NOT stop taking Eliquis without talking to the doctor who prescribed the medication.  Stopping may increase your risk of developing a new blood clot.  Refill your prescription before you run out.  After discharge, you should have regular check-up appointments with your healthcare provider that is prescribing your Eliquis.    What do you do if you miss a dose? If a dose of ELIQUIS is not taken at the scheduled time, take it as soon as possible on the same day and twice-daily administration should be resumed. The dose should not be doubled to make up for a missed dose.  Important Safety Information A possible side effect of Eliquis is bleeding. You should call your healthcare provider right away if you experience any of the following: ? Bleeding from an injury or your nose that does not stop. ? Unusual colored urine (red or dark brown) or unusual colored stools (red or black). ? Unusual bruising for unknown reasons. ? A serious fall or if you hit your head (even if there is no bleeding).  Some medicines may interact with Eliquis and might increase your risk of bleeding or clotting while on Eliquis. To help avoid this, consult your healthcare provider or pharmacist prior to using any new prescription or non-prescription medications, including herbals, vitamins, non-steroidal anti-inflammatory drugs (NSAIDs) and  supplements.  This website has more information on Eliquis (apixaban): http://www.eliquis.com/eliquis/home

## 2018-06-22 NOTE — Progress Notes (Signed)
Progress Note  Patient Name: Travis Huff Date of Encounter: 06/22/2018  Primary Cardiologist: Sinclair Grooms, MD   Subjective   Feels great, no palpitations, ambulated well no chest pain.  Inpatient Medications    Scheduled Meds: . aspirin EC  81 mg Oral Daily  . atorvastatin  80 mg Oral q1800  . docusate sodium  100 mg Oral BID  . DULoxetine  60 mg Oral QHS  . enoxaparin (LOVENOX) injection  40 mg Subcutaneous Q24H  . lisinopril  20 mg Oral Daily   And  . hydrochlorothiazide  25 mg Oral Daily  . metoprolol succinate  100 mg Oral Daily  . senna  2 tablet Oral QHS  . sodium chloride flush  3 mL Intravenous Q12H  . sodium chloride flush  3 mL Intravenous Q12H   Continuous Infusions: . sodium chloride     PRN Meds: sodium chloride, acetaminophen, calcium carbonate, diazepam, nitroGLYCERIN, ondansetron (ZOFRAN) IV, sodium chloride flush, zolpidem   Vital Signs    Vitals:   06/22/18 0300 06/22/18 0400 06/22/18 0725 06/22/18 1017  BP: 116/75 119/82 110/74 111/89  Pulse:  68  77  Resp: 20 19  20   Temp:  97.8 F (36.6 C) 98.3 F (36.8 C) 98.3 F (36.8 C)  TempSrc:  Oral Oral Oral  SpO2:  99%  99%  Weight:      Height:        Intake/Output Summary (Last 24 hours) at 06/22/2018 1132 Last data filed at 06/22/2018 0725 Gross per 24 hour  Intake 1737.67 ml  Output 2125 ml  Net -387.33 ml   Last 3 Weights 06/20/2018 06/20/2018 06/20/2018  Weight (lbs) 315 lb 11.2 oz 300 lb 315 lb  Weight (kg) 143.2 kg 136.079 kg 142.883 kg      Telemetry    Normal sinus rhythm, no PVCs, no adverse arrhythmias- Personally Reviewed  ECG    Normal sinus rhythm- Personally Reviewed  Physical Exam   GEN: No acute distress.   Neck: No JVD Cardiac: RRR, no murmurs, rubs, or gallops.  Cardiac catheterization site normal. Respiratory: Clear to auscultation bilaterally. GI: Soft, nontender, non-distended  MS: No edema; No deformity. Neuro:  Nonfocal  Psych: Normal affect   Labs    Chemistry Recent Labs  Lab 06/19/18 1109 06/20/18 2254 06/21/18 0532  NA 141 137 138  K 4.6 4.2 4.6  CL 102 100 99  CO2 28 25 28   GLUCOSE 111* 92 115*  BUN 22 15 13   CREATININE 0.97 0.99 0.99  CALCIUM 9.6 9.5 9.6  PROT 6.9  --   --   AST 32  --   --   ALT 32  --   --   BILITOT 0.3  --   --   GFRNONAA 86 >60 >60  GFRAA 100 >60 >60  ANIONGAP  --  12 11     Hematology Recent Labs  Lab 06/20/18 2254 06/21/18 0532 06/22/18 0234  WBC 12.5* 9.5 9.1  RBC 5.04 4.86 5.02  HGB 14.1 13.6 14.0  HCT 45.4 42.6 43.6  MCV 90.1 87.7 86.9  MCH 28.0 28.0 27.9  MCHC 31.1 31.9 32.1  RDW 15.3 15.3 15.6*  PLT 310 282 300    Cardiac Enzymes Recent Labs  Lab 06/20/18 1644 06/20/18 2254 06/21/18 0532  TROPONINI 1.41* 1.03* 0.65*   No results for input(s): TROPIPOC in the last 168 hours.   BNP Recent Labs  Lab 06/20/18 2254  BNP 676.6*  DDimer No results for input(s): DDIMER in the last 168 hours.   Radiology    Ct Angio Chest Pe W And/or Wo Contrast  Result Date: 06/20/2018 CLINICAL DATA:  Chest pain EXAM: CT ANGIOGRAPHY CHEST WITH CONTRAST TECHNIQUE: Multidetector CT imaging of the chest was performed using the standard protocol during bolus administration of intravenous contrast. Multiplanar CT image reconstructions and MIPs were obtained to evaluate the vascular anatomy. CONTRAST:  45mL OMNIPAQUE IOHEXOL 350 MG/ML SOLN COMPARISON:  07/21/2016 FINDINGS: Cardiovascular: Cardiomegaly. Aorta is normal caliber. No filling defects in the pulmonary arteries to suggest pulmonary emboli. There is partial anomalous pulmonary venous return in the left upper lobe with the left upper lobe pulmonary vein draining into the left innominate vein. Mediastinum/Nodes: No mediastinal, hilar, or axillary adenopathy. Trachea and esophagus are unremarkable. Thyroid unremarkable. Lungs/Pleura: Mild vascular congestion. No confluent airspace opacities or effusions. Minimal bibasilar  atelectasis. Upper Abdomen: Imaging into the upper abdomen shows no acute findings. Musculoskeletal: Chest wall soft tissues are unremarkable. No acute bony abnormality. Review of the MIP images confirms the above findings. IMPRESSION: Cardiomegaly, vascular congestion. No evidence of pulmonary embolus. Partial anomalous pulmonary venous return in the left upper lobe. Electronically Signed   By: Rolm Baptise M.D.   On: 06/20/2018 23:40   Dg Chest Port 1 View  Result Date: 06/20/2018 CLINICAL DATA:  Short of breath EXAM: PORTABLE CHEST 1 VIEW COMPARISON:  CT chest 07/21/2016 FINDINGS: Mild cardiomegaly with vascular congestion. No acute consolidation or effusion. No pneumothorax. IMPRESSION: Cardiomegaly with mild central vascular congestion Electronically Signed   By: Donavan Foil M.D.   On: 06/20/2018 22:27    Cardiac Studies   Cardiac catheterization 06/21/2018:  Dist LAD lesion is 75% stenosed. 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  There is mild left ventricular systolic dysfunction.  The left ventricular ejection fraction is 35-45% by visual estimate.  There is no aortic valve stenosis.   No atherosclerotic CAD.   SCAD at the apical LAD.   Hold anti coagulation for now.  Continue medical therapy.   Echocardiogram 06/21/2018:   1. Severe hypokinesis of the left ventricular, mid-apical anterolateral wall.  2. The left ventricle has mild-moderately reduced systolic function, with an ejection fraction of 40-45%. The cavity size was mildly dilated. There is mild concentric left ventricular hypertrophy. Left ventricular diastolic Doppler parameters are  indeterminate. Left ventricular diffuse hypokinesis.  3. No evidence of mitral valve stenosis.  4. The tricuspid valve is grossly normal.  5. The aortic valve is grossly normal. No stenosis of the aortic valve.  6. The aortic root, aortic arch and ascending aorta are normal in size and structure.    Patient  Profile     58 y.o. 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  Troponin peak 1.4 -Supportive care, atorvastatin 80, aspirin 81 -No arrhythmias.  Ambulated well with cane and 1 assist.  Has neuropathy and slightly unsteady gait but did well.  No chest pain.  Appreciate cardiac rehab. -Personally reviewed telemetry and there were no adverse arrhythmias.  Ischemic cardiomyopathy -EF 40-45%.  Mild reduction in the setting of his scad. - Lisinopril 20, Toprol 100.  Appears fluid stable.  Chronic anticoagulation for recurrent DVT with one episode of PE, IVC filter in place prior to previous hip surgery. -Holding Eliquis for  the next 7 days.  No evidence of arrhythmias.  Recommendation was to have a 30-day event monitor.  Had increasing frequency of palpitations.  These been going on for about 2 years.  Occasionally he will feel faint.  One time when standing to urinate at the bathroom he stated that he had to go to his knees.  One time when walking to his car from work he felt a sensation of palpitation, felt it above his eyes he states.  Previously PVC has been diagnosed.  [name], his wife was a Marine scientist on 2 heart for 30 years, now works in the [employer].  - No clotting disorder from heme-onc.  Essential hypertension - I will stop his hydrochlorothiazide.  This is being used in combination with his lisinopril.  I wonder if some of the sensations that he is feeling are perhaps exacerbated by vagal type episodes.  I do not want him to be overly dehydrated or have his blood pressure get overly low.  Blood pressure here has been low normal.  Morbid obesity  - continue weight loss strategy. Hard with hip and back.  Currently doing well.  Seems stable for discharge. We will go  ahead and obtain a 30-day event monitor for him and 7-day follow-up transition of care.  Obviously if any worrisome symptoms develop, he knows to seek medical attention.  Spoke to his wife who was a long-term cardiac nurse on the phone.  For questions or updates, please contact [facility] Please consult [email or web address] for contact info under        Signed, Candee Furbish, MD  06/22/2018, 11:32 AM

## 2018-06-22 NOTE — Plan of Care (Signed)
  Problem: Education: Goal: Understanding of CV disease, CV risk reduction, and recovery process will improve Outcome: Adequate for Discharge Goal: Individualized Educational Video(s) Outcome: Adequate for Discharge   Problem: Education: Goal: Individualized Educational Video(s) Outcome: Adequate for Discharge   Problem: Activity: Goal: Ability to return to baseline activity level will improve Outcome: Adequate for Discharge   Problem: Cardiovascular: Goal: Ability to achieve and maintain adequate cardiovascular perfusion will improve Outcome: Adequate for Discharge Goal: Vascular access site(s) Level 0-1 will be maintained Outcome: Adequate for Discharge   Problem: Health Behavior/Discharge Planning: Goal: Ability to safely manage health-related needs after discharge will improve Outcome: Adequate for Discharge   Problem: Activity: Goal: Risk for activity intolerance will decrease Outcome: Adequate for Discharge   Problem: Nutrition: Goal: Adequate nutrition will be maintained Outcome: Adequate for Discharge   Problem: Coping: Goal: Level of anxiety will decrease Outcome: Adequate for Discharge   Problem: Elimination: Goal: Will not experience complications related to bowel motility Outcome: Adequate for Discharge Goal: Will not experience complications related to urinary retention Outcome: Adequate for Discharge   Problem: Pain Managment: Goal: General experience of comfort will improve Outcome: Adequate for Discharge   Problem: Safety: Goal: Ability to remain free from injury will improve Outcome: Adequate for Discharge

## 2018-06-22 NOTE — Plan of Care (Signed)

## 2018-06-22 NOTE — Progress Notes (Signed)
CARDIAC REHAB PHASE I   PRE:  Rate/Rhythm: 75 PVC  BP:  Sitting: 112/68        SaO2: 93% RA  MODE:  Ambulation: 140 ft   POST:  Rate/Rhythm: 103 PVc  BP:  Sitting: 115/87      SaO2: 97% RA  Pt ambulated 140 ft with cane and one assist. Pt has neuropathy and was unsteady with walk. Pt denied SOB or CP. Pt educated on risk factors, exercise guidelines, nutrition, MI booklet, wound care, restrictions, and CRP II. Will refer to [place] CRP II. Pt to chair with call bell and phone within reach.   [phone]  Jeralyn Bennett BS, ACSM CEP 06/22/2018  8:37 AM

## 2018-06-23 ENCOUNTER — Telehealth: Payer: Self-pay | Admitting: Cardiology

## 2018-06-23 DIAGNOSIS — R55 Syncope and collapse: Secondary | ICD-10-CM

## 2018-06-23 DIAGNOSIS — I493 Ventricular premature depolarization: Secondary | ICD-10-CM

## 2018-06-23 LAB — HEMOGLOBIN A1C
Hgb A1c MFr Bld: 5.6 % of total Hgb (ref <5.7)
Mean Plasma Glucose: 114 (calc)
eAG (mmol/L): 6.3 (calc)

## 2018-06-23 LAB — TESTOSTERONE, FREE & TOTAL
Free Testosterone: 46.3 pg/mL (ref 35.0–155.0)
Testosterone, Total, LC-MS-MS: 299 ng/dL (ref 250–1100)

## 2018-06-23 LAB — CBC
HCT: 41.3 % (ref 38.5–50.0)
Hemoglobin: 13.5 g/dL (ref 13.2–17.1)
MCH: 28.2 pg (ref 27.0–33.0)
MCHC: 32.7 g/dL (ref 32.0–36.0)
MCV: 86.4 fL (ref 80.0–100.0)
MPV: 10.6 fL (ref 7.5–12.5)
Platelets: 303 10*3/uL (ref 140–400)
RBC: 4.78 10*6/uL (ref 4.20–5.80)
RDW: 14.2 % (ref 11.0–15.0)
WBC: 12.1 10*3/uL — ABNORMAL HIGH (ref 3.8–10.8)

## 2018-06-23 LAB — COMPLETE METABOLIC PANEL WITH GFR
AG Ratio: 1.5 (calc) (ref 1.0–2.5)
ALT: 32 U/L (ref 9–46)
AST: 32 U/L (ref 10–35)
Albumin: 4.1 g/dL (ref 3.6–5.1)
Alkaline phosphatase (APISO): 117 U/L (ref 35–144)
BUN: 22 mg/dL (ref 7–25)
CO2: 28 mmol/L (ref 20–32)
Calcium: 9.6 mg/dL (ref 8.6–10.3)
Chloride: 102 mmol/L (ref 98–110)
Creat: 0.97 mg/dL (ref 0.70–1.33)
GFR, Est African American: 100 mL/min/{1.73_m2} (ref >=60)
GFR, Est Non African American: 86 mL/min/{1.73_m2} (ref >=60)
Globulin: 2.8 g/dL (calc) (ref 1.9–3.7)
Glucose, Bld: 111 mg/dL — ABNORMAL HIGH (ref 65–99)
Potassium: 4.6 mmol/L (ref 3.5–5.3)
Sodium: 141 mmol/L (ref 135–146)
Total Bilirubin: 0.3 mg/dL (ref 0.2–1.2)
Total Protein: 6.9 g/dL (ref 6.1–8.1)

## 2018-06-23 LAB — LIPID PANEL W/REFLEX DIRECT LDL
Cholesterol: 219 mg/dL — ABNORMAL HIGH (ref <200)
HDL: 75 mg/dL (ref >=40)
LDL Cholesterol (Calc): 126 mg/dL (calc) — ABNORMAL HIGH
Non-HDL Cholesterol (Calc): 144 mg/dL (calc) — ABNORMAL HIGH (ref <130)
Total CHOL/HDL Ratio: 2.9 (calc) (ref <5.0)
Triglycerides: 81 mg/dL (ref <150)

## 2018-06-23 LAB — PSA, TOTAL AND FREE
PSA, % Free: 17 % (calc) — ABNORMAL LOW (ref >25)
PSA, Free: 0.2 ng/mL
PSA, Total: 1.2 ng/mL (ref <=4.0)

## 2018-06-23 LAB — TSH: TSH: 1.45 mIU/L (ref 0.40–4.50)

## 2018-06-23 NOTE — Telephone Encounter (Signed)
    Primary Cardiologist: Dr. Tamala Julian  Brief Patient Profile: 58 y.o. 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 Was discharged home yesterday.   HPI: since returning home, he has had issues with labile BP, but today mostly low BP in the 88A systolic. He held his lisinopril and metoprolol today. HR controlled. He has felt dizzy and had experienced occasional palpitations but no syncope/ near syncope. No CP or dyspnea. Feels fine sitting, but upon standing he gets swimmy headed. Plans per d/c summery is for outpatient monitor, which is pending. He is speaking in clear complete sentences, in no acute distress. Speech and breathing unlabored. I also had conversation with his wife on the phone.   Recommendations: pt advised to continue to hold home BP meds and to use caution with positional changes. I encouraged bed rest the remainder of the day. If symptoms worsen or if any syncope, he will call back/ seek emergency medical attention.   F/u: I will route to Dr. Tamala Julian for notification and Triage to f/u with Pt in the AM w/ telephone call and offer clinic appt. Can see about getting him close f/u w/ APP in our [facility] tomorrow or Tuesday and set up with outpatient monitor.

## 2018-06-23 NOTE — Telephone Encounter (Signed)
Stop lisinopril. Continue to monitor BP.

## 2018-06-24 ENCOUNTER — Telehealth: Payer: Self-pay | Admitting: Interventional Cardiology

## 2018-06-24 ENCOUNTER — Encounter (HOSPITAL_COMMUNITY): Payer: Self-pay | Admitting: Interventional Cardiology

## 2018-06-24 ENCOUNTER — Other Ambulatory Visit: Payer: Self-pay | Admitting: *Deleted

## 2018-06-24 ENCOUNTER — Telehealth: Payer: Self-pay | Admitting: *Deleted

## 2018-06-24 NOTE — Telephone Encounter (Signed)
See previous phone note.  

## 2018-06-24 NOTE — Telephone Encounter (Signed)
Spoke with Dr. Tamala Julian and he said we also need a 30 day monitor placed.  Spoke with pt and his wife (per his permission) and went over recommendations.  Both verbalized understanding.

## 2018-06-24 NOTE — Telephone Encounter (Signed)
New Message   Patient calling in stating husband suppose to have heart monitor per Ellen Henri and wants to know if she wants her husband to come in and be seen.

## 2018-06-24 NOTE — Telephone Encounter (Signed)
[employer] to ship 30 day cardiac event monitor to the patients home.  Our [employer] representative has been notified to please expedite shipping.  [employer] will call patient today to confirm shipping address.  Upon receipt, patient to review instructions, apply monitor, and call [employer] at [phone] to send baseline recording.  [employer] will answer any questions regarding monitor instructions as that time.

## 2018-06-24 NOTE — Addendum Note (Signed)
Addended by: Loren Racer on: 06/24/2018 11:28 AM   Modules accepted: Orders

## 2018-06-24 NOTE — Patient Outreach (Addendum)
[facility]) Care Management  06/24/2018  Travis Huff 06-14-1960 [id number]   Transition of care call/case closure   Referral received: 06/21/18 Initial outreach: 06/24/18 Insurance: [place] Choice Plan   Subjective: Initial successful telephone call to patient's preferred number in order to complete transition of care assessment; 2 HIPAA identifiers verified. Explained purpose of call to patient. Patient gave verbal permission for this RNCM to speak with his wife, Travis Huff, in order to complete the transition of care assessment. Travis Huff says she has been a Airline pilot for many years and has taken care of many cardiac patients.  Travis Huff states Travis Huff is doing OK, she says they have not received a call from cardiac rehabilitation to arrange outpatient rehabilitation.  She says a cardiac monitor is being mailed to them and that it should arrive in the next 2-3 days. Travis Huff has been instructed to wear it for 30 days. Travis Huff says Travis Huff has not experienced any chest pain since hospital discharge but is still having spells that cause his blood pressure to elevate with palpitations. Travis Huff says she is not sure if she purchased the hospital indemnity benefit and is requesting the contact number for [employer]. Travis Huff also states she has some concerns about the care Travis Huff has received in the hospital emergency room in the last 6 months..    Objective:  Travis Huff was hospitalized at [facility] from 6/18-6/20/2020 for non ST elevated myocardial infarction due to spontaneous dissection of coronary artery causing 75% stenosis of left anterior descending artery per cardiac catheretization on 06/21/18.  Comorbidities include: history of DVT, HTN, ischemic cardiomyopathy, near syncope, viral pericarditis, GERD, cellulitis of right foot, generalized anxiety disorder, insomnia,. Mixed hyperlipidemia, obesity, spinal stenosis He was discharged to home on 06/22/18 without the need for home health  services or DME but with a referral to cardiac rehabilitation.   Assessment:  Patient's wife  voices good understanding of all discharge instructions.  See transition of care flowsheet for assessment details.   Plan:  Reviewed hospital discharge diagnosis of non ST elevated myocardial infection and treatment plan using hospital discharge instructions, assessing medication adherence, reviewing problems requiring provider notification, and discussing the importance of follow up with cardiologist as directed. Provided Travis Huff with contact number of [employer] and the Office Of Patient Experience.  Also advised her the staff from cardiac rehabilitation should be calling Travis Huff to arrange outpatient rehabilitation. Ensured she has the phone number for cardiac rehabilitation.  No ongoing care management needs identified so will close case to [facility] Management services and route successful outreach letter with [facility] Management pamphlet and 24 Hour Nurse Line Magnet to [name] clinical pool to be mailed to patient's home address.   Travis Ellison RN,CCM,CDE [facility] Management Coordinator Office Phone [phone] Office Fax [phone]

## 2018-06-24 NOTE — Telephone Encounter (Signed)
Patient contacted regarding discharge from [facility] on June 22, 2018.  Patient understands to follow up with provider Dr. Tamala Julian on July 10, 2018 at 1:20pm at [facility]. Patient understands discharge instructions? yes Patient understands medications and regiment? yes Patient understands to bring all medications to this visit? yes

## 2018-06-27 NOTE — Telephone Encounter (Signed)
[employer] contacted.  Shipping address has been confirmed but monitor has not yet been shipped.  Representative emailing shipping, 06/27/18 4:25 PM EST, to have sent out next day air.  Patient notified he should be receiving monitor tomorrow.

## 2018-06-27 NOTE — Telephone Encounter (Signed)
Patient calling to check on status, still hasn't received his monitor.

## 2018-06-28 ENCOUNTER — Ambulatory Visit (INDEPENDENT_AMBULATORY_CARE_PROVIDER_SITE_OTHER): Payer: 59

## 2018-06-28 DIAGNOSIS — I493 Ventricular premature depolarization: Secondary | ICD-10-CM

## 2018-06-28 DIAGNOSIS — R55 Syncope and collapse: Secondary | ICD-10-CM | POA: Diagnosis not present

## 2018-07-02 ENCOUNTER — Telehealth: Payer: Self-pay | Admitting: Physician Assistant

## 2018-07-02 ENCOUNTER — Encounter: Payer: Self-pay | Admitting: Interventional Cardiology

## 2018-07-02 NOTE — Telephone Encounter (Signed)
Called by Travis Huff w/ [employer] regarding the monitor.  He had an episode of Afib, HR 180 at times.  He has restarted his Eliquis and is on his Toprol XL.   I called the patient and spoke to both the patient and his wife.  A few minutes after 12:00, he was straining at bowel and had sudden onset of palpitations.  He felt a little weak and had some nausea at first, but that improved.  He had taken all of his medications as usual and was not having any other issues or difficulties.  He did not take any medications but laid down and the symptoms resolved.  His wife notes that he has been pale and tired the whole rest of the day.  He currently feels well and is not symptomatic.  I advised that if he had another episode of the rapid palpitations, he should monitor his symptoms very carefully.  Consider calling 911 if he feels lightheaded or dizzy, gets chest pain or shortness of breath.  They are in agreement with this as a plan.  Rosaria Ferries, PA-C 07/02/2018 6:59 PM Beeper [phone]

## 2018-07-03 MED FILL — DULOXETINE HCL 60 MG CPEP: 60 | 90 days supply | Qty: 90 | Fill #1

## 2018-07-03 NOTE — Telephone Encounter (Signed)
Strip received from yesterday am report of afib, rvr 180. Signed by Dr. Rayann Heman.  To be scanned into chart.

## 2018-07-04 ENCOUNTER — Encounter: Payer: Self-pay | Admitting: Interventional Cardiology

## 2018-07-04 DIAGNOSIS — I480 Paroxysmal atrial fibrillation: Secondary | ICD-10-CM | POA: Insufficient documentation

## 2018-07-04 NOTE — Telephone Encounter (Signed)
Noted. Let family/wife know we this this is what has been happening and that burden will determine the next step.

## 2018-07-09 NOTE — Progress Notes (Signed)
Cardiology Office Note:    Date:  07/10/2018   ID:  Travis Huff, DOB 11-17-1960, MRN [id number]  PCP:  Silverio Decamp, MD  Cardiologist:  Sinclair Grooms, MD   Referring MD: Silverio Decamp,*   Chief Complaint  Patient presents with   Coronary Artery Disease   Atrial Fibrillation    History of Present Illness:    Travis Huff is a 58 y.o. male with a hx of w/anxiety, HTN, obesity, multiple PE's (on apixaban and s/p IVC filter prior to orthopedic surgery--> filter to be removed), NSTEMI, probable CA dissection, PAF documented on OP monitor, and chronic combined diastolic and systolic HF due to ischemic cardiomyopathy (EF 40-45%).  Recent hospitalization for sudden rapid heart rate, weakness, and eventual syncope.  After hospitalization work-up identified sudden narrowing in the apical LAD that angiographically was consistent with coronary dissection of uncertain age.  Cath was done because the patient had relatively mild enzyme elevation.  Subsequently on telemetry monitor at home he has been identified to have atrial fib with RVR and recurrence of lightheaded/presyncopal symptoms as he had on the day of admission.  There is no prior history of atrial fibrillation although episodes of weakness and near syncope have occurred off and on for greater than 2 years.  Pounding heart rate is always associated.  He has never had chest discomfort.  He specifically denied chest discomfort on the admission to the hospital.  In fact, he was admitted to the hospital after his primary physician ordered a troponin following the syncopal episode.  The troponin level was done at least 12 hours after the episode of syncope.  He was asymptomatic when admitted and had no recurrent symptoms.   Past Medical History:  Diagnosis Date   Arthritis    Asthma    as child   Avascular necrosis of bone of left hip (HCC)    Back pain    Blood clot in vein 06/2016   x 2 no bleeding  disorders found   Complication of anesthesia    ketamine hallucinations   GERD (gastroesophageal reflux disease)    Hypertension    Viral pericarditis 20 yrs ago    Past Surgical History:  Procedure Laterality Date   ABDOMINAL EXPOSURE N/A 06/23/2016   Procedure: ABDOMINAL EXPOSURE;  Surgeon: Rosetta Posner, MD;  Location: [facility];  Service: Vascular;  Laterality: N/A;   ANTERIOR LATERAL LUMBAR FUSION 4 LEVELS Right 06/23/2016   Procedure: Right  Lumbar two-three Lumbar three-four Lumbar four-five Anterolateral lumbar interbody fusion;  Surgeon: Erline Levine, MD;  Location: [place];  Service: Neurosurgery;  Laterality: Right;   ANTERIOR LUMBAR FUSION N/A 06/23/2016   Procedure: Lumbar five -sacral one  Anterior lumbar interbody fusion with Dr. Sherren Mocha Early for approach;  Surgeon: Erline Levine, MD;  Location: [facility];  Service: Neurosurgery;  Laterality: N/A;   APPLICATION OF INTRAOPERATIVE CT SCAN N/A 06/26/2016   Procedure: APPLICATION OF INTRAOPERATIVE CT SCAN;  Surgeon: Erline Levine, MD;  Location: [facility];  Service: Neurosurgery;  Laterality: N/A;   CARDIOVASCULAR STRESS TEST     Negative in May 2014   COLONOSCOPY     HERNIA REPAIR     umbilical   IR IVC FILTER PLMT / S&I /IMG GUID/MOD SED  03/26/2018   JOINT REPLACEMENT     LEFT HEART CATH AND CORONARY ANGIOGRAPHY N/A 06/21/2018   Procedure: LEFT HEART CATH AND CORONARY ANGIOGRAPHY;  Surgeon: Jettie Booze, MD;  Location: [facility];  Service: Cardiovascular;  Laterality: N/A;   POSTERIOR LUMBAR FUSION 4 LEVEL N/A 06/26/2016   Procedure: Thoracic ten to Pelvis fixation with AIRO;  Surgeon: Erline Levine, MD;  Location: [facility];  Service: Neurosurgery;  Laterality: N/A;   TENDON REPAIR     right hand   TOTAL HIP ARTHROPLASTY Right 05/25/2014   Procedure: RIGHT TOTAL HIP ARTHROPLASTY ANTERIOR APPROACH;  Surgeon: Rod Can, MD;  Location: [facility];  Service: Orthopedics;  Laterality: Right;   TOTAL HIP ARTHROPLASTY  Left 03/27/2018   Procedure: TOTAL HIP ARTHROPLASTY ANTERIOR APPROACH- DA complex;  Surgeon: Rod Can, MD;  Location: [facility];  Service: Orthopedics;  Laterality: Left;   TOTAL KNEE ARTHROPLASTY Bilateral     Current Medications: Current Meds  Medication Sig   AMBULATORY NON FORMULARY MEDICATION Knee-high, medium compression, graduated compression stockings. Apply to lower extremities. [email or web address], Zippered Compression Stockings, medium circ, long length   apixaban (ELIQUIS) 2.5 MG TABS tablet Take 1 tablet (2.5 mg total) by mouth 2 (two) times daily.   aspirin EC 81 MG EC tablet Take 1 tablet (81 mg total) by mouth daily.   atorvastatin (LIPITOR) 80 MG tablet Take 1 tablet (80 mg total) by mouth daily at 6 PM.   calcium carbonate (TUMS - DOSED IN MG ELEMENTAL CALCIUM) 500 MG chewable tablet Chew 3-4 tablets by mouth 2 (two) times daily as needed (stomach cramps).    cyclobenzaprine (FLEXERIL) 10 MG tablet Take 10 mg by mouth 3 (three) times daily as needed for muscle spasms.   docusate sodium (COLACE) 100 MG capsule Take 1 capsule (100 mg total) by mouth 2 (two) times daily.   DULoxetine (CYMBALTA) 60 MG capsule Take 1 capsule (60 mg total) by mouth at bedtime.   HYDROmorphone (DILAUDID) 2 MG tablet Take 1-2 tablets (2-4 mg total) by mouth every 4 (four) hours as needed for severe pain.   lisinopril (ZESTRIL) 20 MG tablet Take 20 mg by mouth daily.   metoprolol succinate (TOPROL-XL) 100 MG 24 hr tablet Take 1 tablet (100 mg total) by mouth daily. Take with or immediately following a meal.   nitroGLYCERIN (NITROSTAT) 0.4 MG SL tablet Place 1 tablet (0.4 mg total) under the tongue every 5 (five) minutes x 3 doses as needed for chest pain. If taking third dose, call 911.   polyethylene glycol (MIRALAX / GLYCOLAX) 17 g packet Take 17 g by mouth daily as needed for mild constipation.   terbinafine (LAMISIL) 250 MG tablet Take 250 mg by mouth daily.   zolpidem  (AMBIEN) 5 MG tablet Take 1 tablet (5 mg total) by mouth at bedtime as needed for sleep.     Allergies:   Ketamine and Morphine and related   Social History   Socioeconomic History   Marital status: Married    Spouse name: Not on file   Number of children: 2   Years of education: 12   Highest education level: Not on file  Occupational History   Occupation: Public relations account executive  Social Needs   Financial resource strain: Not on file   Food insecurity    Worry: Not on file    Inability: Not on file   Transportation needs    Medical: Not on file    Non-medical: Not on file  Tobacco Use   Smoking status: Former Smoker    Packs/day: 1.00    Years: 25.00    Pack years: 25.00    Types: Cigarettes    Quit date: 2000  Years since quitting: 20.5   Smokeless tobacco: Current User    Types: Snuff  Substance and Sexual Activity   Alcohol use: Yes    Alcohol/week: 14.0 standard drinks    Types: 14 Cans of beer per week    Comment: 2 beers QD   Drug use: No   Sexual activity: Not on file  Lifestyle   Physical activity    Days per week: Not on file    Minutes per session: Not on file   Stress: Not on file  Relationships   Social connections    Talks on phone: Not on file    Gets together: Not on file    Attends religious service: Not on file    Active member of club or organization: Not on file    Attends meetings of clubs or organizations: Not on file    Relationship status: Not on file  Other Topics Concern   Not on file  Social History Narrative   Lives with wife in a one story home.  Has 2 children.  Works as a Public relations account executive.  Education: high school.      Family History: The patient's family history includes Healthy in his daughter. He was adopted.  ROS:   Please see the history of present illness.    Excessive daytime sleepiness and fatigue.  Chronic pain related to lumbar and cervical disc disease.  Also has articular problems including both knees  and hips.  The patient and wife complain of bilateral lower extremity cyanosis that is intermittent.  He does not have exertional leg discomfort.  All other systems reviewed and are negative.  EKGs/Labs/Other Studies Reviewed:    The following studies were reviewed today:  ECHOCARDIOGRAM JUNE 2020:  IMPRESSIONS    1. Severe hypokinesis of the left ventricular, mid-apical anterolateral wall.  2. The left ventricle has mild-moderately reduced systolic function, with an ejection fraction of 40-45%. The cavity size was mildly dilated. There is mild concentric left ventricular hypertrophy. Left ventricular diastolic Doppler parameters are  indeterminate. Left ventricular diffuse hypokinesis.  3. No evidence of mitral valve stenosis.  4. The tricuspid valve is grossly normal.  5. The aortic valve is grossly normal. No stenosis of the aortic valve.  6. The aortic root, aortic arch and ascending aorta are normal in size and structure.  [place] 07/02/2018:  Episode AF 6/30 at 180 bpm (preliminary report)  EKG:  EKG not repeated  Recent Labs: 06/19/2018: ALT 32 06/20/2018: B Natriuretic Peptide 676.6; Magnesium 2.0; TSH 4.369 06/21/2018: BUN 13; Creatinine, Ser 0.99; Potassium 4.6; Sodium 138 06/22/2018: Hemoglobin 14.0; Platelets 300  Recent Lipid Panel    Component Value Date/Time   CHOL 219 (H) 06/19/2018 1109   TRIG 81 06/19/2018 1109   HDL 75 06/19/2018 1109   CHOLHDL 2.9 06/19/2018 1109   VLDL 15 05/23/2012 0610   LDLCALC 126 (H) 06/19/2018 1109    Physical Exam:    VS:  BP 122/84    Pulse 80    Ht 6' (1.829 m)    Wt (!) 322 lb 6.4 oz (146.2 kg)    SpO2 99%    BMI 43.73 kg/m     Wt Readings from Last 3 Encounters:  07/10/18 (!) 322 lb 6.4 oz (146.2 kg)  06/20/18 (!) 315 lb 11.2 oz (143.2 kg)  06/20/18 (!) 315 lb (142.9 kg)     GEN: Morbidly obese mostly abdominal and truncal. No acute distress HEENT: Normal NECK: No JVD. LYMPHATICS: No  lymphadenopathy CARDIAC:  RRR.  No murmur, no gallop, no edema VASCULAR: 2+ radial pulses, no bruits RESPIRATORY:  Clear to auscultation without rales, wheezing or rhonchi  ABDOMEN: Soft, non-tender, non-distended, No pulsatile mass, MUSCULOSKELETAL: No deformity  SKIN: Warm and dry NEUROLOGIC:  Alert and oriented x 3 PSYCHIATRIC:  Normal affect   ASSESSMENT:    1. NSTEMI (non-ST elevated myocardial infarction) ([place])   2. Benign essential hypertension   3. Spontaneous dissection of coronary artery   4. PAF (paroxysmal atrial fibrillation) ([place])   5. Ischemic cardiomyopathy   6. Chronic anticoagulation    PLAN:    In order of problems listed above:  1. Etiology CA dissection LAD and/or type 2 injury from AF with RVR. No atherosclerosis noted on cath.  2D Doppler echocardiogram the end of September. 2. Increase metoprolol succinate to 150 mg/day 3. ? New coronary dissection versus chronic.  Other possibility to explain the apical LAD would be a coronary embolus.  We will see what happens the LV function and let that help as make a final diagnosis.  In other words, if persistent LV dysfunction is present, this would imply a prior apical infarction of unknown duration.  Did not have enough enzyme elevation during this hospital stay to be consistent with an apical ischemic infarction.  The enzyme elevation was more typical for demand ischemia 4. With excessive daytime sleepiness and fatigue, sleep apnea needs to be excluded.  He is so young, amiodarone is not a good initial option.  Sotalol and Tikosyn although only other choices and require rehospitalization.  Flecainide is not a good choice given the ischemic presentation.  EP consultation to consider treatment strategies for atrial fibrillation which are compromising from a hemodynamic standpoint.  Increasing metoprolol succinate to 150 mg/day. 5. Repeat 2D Doppler echocardiogram in September. 6. Discontinue aspirin  The natural history of atrial fibrillation was  discussed.  The inability to cure and the possibility of recurrences was clearly stated.  Management strategies including rhythm control (antiarrhythmic therapy), rate control (beta-blocker therapy or AV node blocking calcium channel blocker therapy), and ablation and/or pacemaker therapy were reviewed.  Stroke risk (as determined by CHADS VASC score >1) was discussed relative to the patient's individual profile.  The expected duration/permanence of anti-coagulation therapy was determined based on the individual risk score.  Coumadin versus NOAC therapy was reviewed, highlighting the lower bleeding risk and improved safety with NOAC therapy.  Identification of etiology/contributors to A. fib were discussed and therefore a sleep study is ordered given his body phenotype and excessive daytime sleepiness.  Greater than 50% of the time during this office visit was spent in education, counseling, and coordination of care related to underlying disease process and testing as outlined.  Medication Adjustments/Labs and Tests Ordered: Current medicines are reviewed at length with the patient today.  Concerns regarding medicines are outlined above.  No orders of the defined types were placed in this encounter.  No orders of the defined types were placed in this encounter.   There are no Patient Instructions on file for this visit.   Signed, Sinclair Grooms, MD  07/10/2018 1:35 PM    [place]

## 2018-07-10 ENCOUNTER — Other Ambulatory Visit: Payer: Self-pay

## 2018-07-10 ENCOUNTER — Encounter: Payer: Self-pay | Admitting: Interventional Cardiology

## 2018-07-10 ENCOUNTER — Ambulatory Visit: Payer: 59 | Admitting: Interventional Cardiology

## 2018-07-10 ENCOUNTER — Telehealth: Payer: Self-pay | Admitting: Interventional Cardiology

## 2018-07-10 VITALS — BP 122/84 | HR 80 | Ht 72.0 in | Wt 322.4 lb

## 2018-07-10 DIAGNOSIS — Z7901 Long term (current) use of anticoagulants: Secondary | ICD-10-CM | POA: Diagnosis not present

## 2018-07-10 DIAGNOSIS — I480 Paroxysmal atrial fibrillation: Secondary | ICD-10-CM | POA: Diagnosis not present

## 2018-07-10 DIAGNOSIS — I214 Non-ST elevation (NSTEMI) myocardial infarction: Secondary | ICD-10-CM

## 2018-07-10 DIAGNOSIS — I2542 Coronary artery dissection: Secondary | ICD-10-CM | POA: Diagnosis not present

## 2018-07-10 DIAGNOSIS — R5383 Other fatigue: Secondary | ICD-10-CM

## 2018-07-10 DIAGNOSIS — I255 Ischemic cardiomyopathy: Secondary | ICD-10-CM | POA: Diagnosis not present

## 2018-07-10 DIAGNOSIS — I10 Essential (primary) hypertension: Secondary | ICD-10-CM | POA: Diagnosis not present

## 2018-07-10 DIAGNOSIS — R400 Somnolence: Secondary | ICD-10-CM | POA: Diagnosis not present

## 2018-07-10 MED ORDER — METOPROLOL SUCCINATE ER 50 MG PO TB24: 50.0000 mg | ORAL_TABLET | Freq: Every day | ORAL | 3 refills | Status: DC

## 2018-07-10 MED FILL — METOPROLOL SUCCINATE ER 50: 50 | 90 days supply | Qty: 90 | Fill #0

## 2018-07-10 NOTE — Telephone Encounter (Signed)

## 2018-07-10 NOTE — Patient Instructions (Addendum)
Medication Instructions:  1) INCREASE Metoprolol Succinate to 150mg  once daily.  You will have to take two separate pills (100mg  tablet and 50mg  tablet). 2) DISCONTINUE  Aspirin  If you need a refill on your cardiac medications before your next appointment, please call your pharmacy.   Lab work: None If you have labs (blood work) drawn today and your tests are completely normal, you will receive your results only by: [name] MyChart Message (if you have MyChart) OR . A paper copy in the mail If you have any lab test that is abnormal or we need to change your treatment, we will call you to review the results.  Testing/Procedures: Your physician has requested that you have an echocardiogram at the end of August. Echocardiography is a painless test that uses sound waves to create images of your heart. It provides your doctor with information about the size and shape of your heart and how well your heart's chambers and valves are working. This procedure takes approximately one hour. There are no restrictions for this procedure.  Your physician has recommended that you have a sleep study. This test records several body functions during sleep, including: brain activity, eye movement, oxygen and carbon dioxide blood levels, heart rate and rhythm, breathing rate and rhythm, the flow of air through your mouth and nose, snoring, body muscle movements, and chest and belly movement.   Follow-Up: At [facility], you and your health needs are our priority.  As part of our continuing mission to provide you with exceptional heart care, we have created designated Provider Care Teams.  These Care Teams include your primary Cardiologist (physician) and Advanced Practice Providers (APPs -  Physician Assistants and Nurse Practitioners) who all work together to provide you with the care you need, when you need it. You will need a follow up appointment in 2 months (Can have 8/26 at 820A or 9/2 at 240p).  Please call our  office 2 months in advance to schedule this appointment.  You may see Sinclair Grooms, MD or one of the following Advanced Practice Providers on your designated Care Team:   Truitt Merle, NP Cecilie Kicks, NP . Kathyrn Drown, NP  Any Other Special Instructions Will Be Listed Below (If Applicable).  You have been referred to Dr. Caryl Comes with our Electrophysiology team.

## 2018-07-12 ENCOUNTER — Telehealth (HOSPITAL_COMMUNITY): Payer: Self-pay

## 2018-07-12 NOTE — Telephone Encounter (Signed)
Called patient to see if he is interested in the Cardiac Rehab Program. Patient expressed interest. Explained scheduling process and went over insurance, patient verbalized understanding. Will contact patient for scheduling once f/u has been completed.  °

## 2018-07-12 NOTE — Telephone Encounter (Signed)
Pt insurance is active and benefits verified through [facility]. Co-pay $0.00, DED $300.00/$300.00 met, out of pocket $7,900.00/$7,900.00 met, co-insurance 0%. No pre-authorization required. Jamyria/[facility], 07/12/2018 @ 255PM, XAJ#[id number]  Will contact patient to see if he is interested in the Cardiac Rehab Program. If interested, patient will need to complete follow up appt. Once completed, patient will be contacted for scheduling upon review by the RN Navigator.

## 2018-07-17 NOTE — Progress Notes (Signed)
Office Visit Note  Patient: Travis Huff             Date of Birth: [birth year]           MRN: [id number]             PCP: Silverio Decamp, MD Referring: Tish Men, MD Visit Date: 07/31/2018 Occupation: disability, Pressman at [employer]  Subjective:  Pain in multiple joints.   History of Present Illness: Travis Huff is a 58 y.o. male seen in consultation per request of Dr. Dianah Field.  According to patient he has had history of knee joint pain for several years.  At age 41 he had left total knee replacement followed by right total knee replacement within 6 months.  He states he has had disc disease of lumbar and thoracic spine for many years as well.  He had right total hip replacement followed by left total hip replacement.  The left total hip replacement was in March 2020.  He also had thoracic and lumbar spine fusion by Dr. [name] in 2017.  Patient states that he had right [facility] surgery about 20 years ago and he also has left CMC joint arthritis.  He has been diagnosed with osteoarthritis of bilateral shoulders and rotator cuff tear arthropathy.  He states he is taking Celebrex for many years without much relief.  He continues to have discomfort in all of his joints.  He states his hands and feet continue to hurt as well.  He is here to see me if he has any underlying autoimmune disorder contributing to his arthritis.  Activities of Daily Living:  Patient reports morning stiffness for 30-40 minutes.   Patient Denies nocturnal pain.  Difficulty dressing/grooming: Reports Difficulty climbing stairs: Reports Difficulty getting out of chair: Reports Difficulty using hands for taps, buttons, cutlery, and/or writing: Reports  Review of Systems  Constitutional: Positive for fatigue. Negative for night sweats.  HENT: Negative for mouth sores, mouth dryness and nose dryness.   Eyes: Negative for pain, redness, itching and dryness.  Respiratory: Negative for cough, shortness  of breath, wheezing and difficulty breathing.   Cardiovascular: Positive for palpitations. Negative for chest pain, hypertension, irregular heartbeat and swelling in legs/feet.  Gastrointestinal: Negative for abdominal pain, blood in stool, constipation and diarrhea.  Endocrine: Negative for increased urination.  Genitourinary: Negative for painful urination and pelvic pain.  Musculoskeletal: Positive for arthralgias, joint pain and morning stiffness. Negative for joint swelling, myalgias, muscle weakness, muscle tenderness and myalgias.  Skin: Negative for color change, rash, hair loss, nodules/bumps, redness, skin tightness, ulcers and sensitivity to sunlight.  Allergic/Immunologic: Negative for susceptible to infections.  Neurological: Positive for weakness. Negative for dizziness, fainting, light-headedness, headaches, memory loss and night sweats.  Hematological: Negative for bruising/bleeding tendency and swollen glands.  Psychiatric/Behavioral: Negative for depressed mood, confusion and sleep disturbance. The patient is nervous/anxious.     PMFS History:  Patient Active Problem List   Diagnosis Date Noted  . PAF (paroxysmal atrial fibrillation) ([place]) 07/04/2018  . Spontaneous dissection of coronary artery 06/22/2018  . Ischemic cardiomyopathy 06/22/2018  . Near syncope 06/20/2018  . Tachycardia 06/20/2018  . Nausea 06/20/2018  . PVC (premature ventricular contraction) 06/20/2018  . History of DVT (deep vein thrombosis) 06/20/2018  . NSTEMI (non-ST elevated myocardial infarction) ([place]) 06/20/2018  . Onychomycosis 06/19/2018  . Venous stasis dermatitis of both lower extremities 06/19/2018  . Insomnia 06/19/2018  . Osteoarthritis of right glenohumeral joint 06/17/2018  . Rotator cuff  arthropathy of left shoulder 06/17/2018  . History of bilateral hip arthroplasty 03/27/2018  . Avascular necrosis of bone of hip ([place]) 03/27/2018  . Recurrent deep vein thrombosis (DVT) ([place])  02/12/2018  . Health counseling 02/12/2018  . Hyperhomocystinemia ([place]) 01/10/2018  . Cellulitis of right foot 01/08/2018  . Intermittent claudication ([place]) 12/04/2017  . Fissure in skin 12/04/2017  . Acute deep vein thrombosis (DVT) of calf muscle vein of right lower extremity 11/26/2017  . Acute deep vein thrombosis of lower limb ([place]) 11/26/2017  . Primary osteoarthritis of first carpometacarpal joint of left hand 11/21/2017  . Hand cramps 10/25/2017  . High frequency hearing loss, left 09/10/2017  . Degeneration of thoracic intervertebral disc 05/30/2017  . Mixed hyperlipidemia 04/18/2017  . Increased creatine kinase level 04/18/2017  . Scoliosis of thoracolumbar spine 06/23/2016  . Anxiety, generalized 05/11/2016  . Generalized anxiety disorder 05/11/2016  . Benign essential hypertension 03/26/2014  . Spinal stenosis, lumbar region, with neurogenic claudication 12/21/2013  . Morbid obesity ([place]) 08/01/2012  . S/P total knee arthroplasty 01/26/2012  . Erectile dysfunction 01/26/2012  . Annual physical exam 01/26/2012  . Impotence 01/26/2012    Past Medical History:  Diagnosis Date  . Arthritis   . Asthma    as child  . Avascular necrosis of bone of left hip ([place])   . Back pain   . Blood clot in vein 06/2016   x 2 no bleeding disorders found  . Complication of anesthesia    ketamine hallucinations  . GERD (gastroesophageal reflux disease)   . Hypertension   . Viral pericarditis 20 yrs ago    Family History  Adopted: Yes  Problem Relation Age of Onset  . Healthy Daughter   . Healthy Daughter    Past Surgical History:  Procedure Laterality Date  . ABDOMINAL EXPOSURE N/A 06/23/2016   Procedure: ABDOMINAL EXPOSURE;  Surgeon: Rosetta Posner, MD;  Location: [place];  Service: Vascular;  Laterality: N/A;  . ANTERIOR LATERAL LUMBAR FUSION 4 LEVELS Right 06/23/2016   Procedure: Right  Lumbar two-three Lumbar three-four Lumbar four-five Anterolateral lumbar interbody fusion;   Surgeon: Erline Levine, MD;  Location: [place];  Service: Neurosurgery;  Laterality: Right;  . ANTERIOR LUMBAR FUSION N/A 06/23/2016   Procedure: Lumbar five -sacral one  Anterior lumbar interbody fusion with Dr. Sherren Mocha Early for approach;  Surgeon: Erline Levine, MD;  Location: [place];  Service: Neurosurgery;  Laterality: N/A;  . APPLICATION OF INTRAOPERATIVE CT SCAN N/A 06/26/2016   Procedure: APPLICATION OF INTRAOPERATIVE CT SCAN;  Surgeon: Erline Levine, MD;  Location: [place];  Service: Neurosurgery;  Laterality: N/A;  . CARDIOVASCULAR STRESS TEST     Negative in May 2014  . COLONOSCOPY    . HERNIA REPAIR     umbilical  . IR IVC FILTER PLMT / S&I Burke Keels GUID/MOD SED  03/26/2018  . JOINT REPLACEMENT    . LEFT HEART CATH AND CORONARY ANGIOGRAPHY N/A 06/21/2018   Procedure: LEFT HEART CATH AND CORONARY ANGIOGRAPHY;  Surgeon: Jettie Booze, MD;  Location: [place] CV LAB;  Service: Cardiovascular;  Laterality: N/A;  . POSTERIOR LUMBAR FUSION 4 LEVEL N/A 06/26/2016   Procedure: Thoracic ten to Pelvis fixation with AIRO;  Surgeon: Erline Levine, MD;  Location: [place];  Service: Neurosurgery;  Laterality: N/A;  . TENDON REPAIR     right hand  . TOTAL HIP ARTHROPLASTY Right 05/25/2014   Procedure: RIGHT TOTAL HIP ARTHROPLASTY ANTERIOR APPROACH;  Surgeon: Rod Can, MD;  [facility]  OR;  Service: Orthopedics;  Laterality: Right;  . TOTAL HIP ARTHROPLASTY Left 03/27/2018   Procedure: TOTAL HIP ARTHROPLASTY ANTERIOR APPROACH- DA complex;  Surgeon: Rod Can, MD;  [facility];  Service: Orthopedics;  Laterality: Left;  . TOTAL KNEE ARTHROPLASTY Bilateral    Social History   Social History Narrative   Lives with wife in a one story home.  Has 2 children.  Works as a Public relations account executive.  Education: high school.    Immunization History  Administered Date(s) Administered  . Influenza Whole 01/13/2011  . Influenza,inj,Quad PF,6+ Mos 10/23/2012, 11/14/2017  . Tdap 11/29/2016  . Zoster  Recombinat (Shingrix) 06/19/2018     Objective: Vital Signs: BP 124/74 (BP Location: Right Arm, Patient Position: Sitting, Cuff Size: Large)   Pulse 80   Resp 16   Ht 6' (1.829 m)   Wt (!) 324 lb (147 kg)   BMI 43.94 kg/m    Physical Exam Vitals signs and nursing note reviewed.  Constitutional:      Appearance: He is well-developed.  HENT:     Head: Normocephalic and atraumatic.  Eyes:     Conjunctiva/sclera: Conjunctivae normal.     Pupils: Pupils are equal, round, and reactive to light.  Neck:     Musculoskeletal: Normal range of motion and neck supple.  Cardiovascular:     Rate and Rhythm: Normal rate and regular rhythm.     Heart sounds: Normal heart sounds.  Pulmonary:     Effort: Pulmonary effort is normal.     Breath sounds: Normal breath sounds.  Abdominal:     General: Bowel sounds are normal.     Palpations: Abdomen is soft.  Musculoskeletal:     Right lower leg: Edema present.     Left lower leg: Edema present.  Skin:    General: Skin is warm and dry.     Capillary Refill: Capillary refill takes less than 2 seconds.     Comments: Varicose veins noted on lower extremities.  Neurological:     Mental Status: He is alert and oriented to person, place, and time.  Psychiatric:        Behavior: Behavior normal.      Musculoskeletal Exam: C-spine good range of motion.  Thoracic and lumbar spine limited range of motion due to fusion.  Shoulder joints and elbow joints with good range of motion.  He has postsurgical changes in the right [facility] joint.  Mild PIP and DIP thickening with no synovitis was noted.  Hip joints with good range of motion which are replaced.  His knee joints with good range of motion which are replaced.  He had no swelling over MTPs PIPs or DIPs.  CDAI Exam: CDAI Score: - Patient Global: -; Provider Global: - Swollen: -; Tender: - Joint Exam   No joint exam has been documented for this visit   There is currently no information documented on  the homunculus. Go to the Rheumatology activity and complete the homunculus joint exam.  Investigation: No additional findings.  Imaging: Xr Foot 2 Views Left  Result Date: 07/31/2018 First MTP PIP and DIP narrowing was noted.  Subluxation of some of the MTP joints was noted.  No erosive changes were noted.  Intertarsal joint space narrowing and dorsal spurring was noted.  Inferior and posterior calcaneal spur was noted.  Tibiotalar joint space narrowing was noted. Impression: These findings are consistent with osteoarthritis and possible inflammatory arthritis.  Xr Foot 2 Views Right  Result Date: 07/31/2018 First  MTP valgus deformity was noted.  Subluxation of second third and fourth MTP joints was noted.  No significant joint space narrowing was noted.  No erosive changes were noted.  PIP and DIP narrowing was noted.  Inferior and posterior calcaneal spurs were noted. Impression: These findings are consistent with osteoarthritis of the foot.  Xr Hand 2 View Left  Result Date: 07/31/2018 Narrowing of second and third MCP joint with subluxation of third MCP joint was noted.  PIP and DIP narrowing was noted.  Narrowing of CMC joint with a spurring was noted.  Radiocarpal joint space narrowing was noted.  Some cystic changes were noted in the carpal bones. Impression: These findings are consistent with osteoarthritis and possible inflammatory arthritis overlap.  Xr Hand 2 View Right  Result Date: 07/31/2018 Postsurgical change in the right [facility] joint was noted.  Subluxation of first, second and third MCP joint with narrowing was noted.  PIP and DIP narrowing was noted.  No intercarpal or radiocarpal joint space narrowing was noted.  No erosive changes were noted. Impression: These findings are consistent with osteoarthritis and raises suspicion of inflammatory arthritis.   Recent Labs: Lab Results  Component Value Date   WBC 9.1 06/22/2018   HGB 14.0 06/22/2018   PLT 300 06/22/2018   NA  138 06/21/2018   K 4.6 06/21/2018   CL 99 06/21/2018   CO2 28 06/21/2018   GLUCOSE 115 (H) 06/21/2018   BUN 13 06/21/2018   CREATININE 0.99 06/21/2018   BILITOT 0.3 06/19/2018   ALKPHOS 88 02/12/2018   AST 32 06/19/2018   ALT 32 06/19/2018   PROT 6.9 06/19/2018   ALBUMIN 4.4 02/12/2018   CALCIUM 9.6 06/21/2018   GFRAA >60 06/21/2018    Speciality Comments: No specialty comments available.  Procedures:  No procedures performed Allergies: Ketamine and Morphine and related   Assessment / Plan:     Visit Diagnoses: Pain in both hands -he complains of pain in bilateral hands.  The x-ray findings were consistent with osteoarthritis and possibility of inflammatory arthritis or the changes consistent with hypermobility.  I do not see synovitis on examination.  He has subluxation of his right [facility] joint which was replaced in the past per patient.  I will obtain labs today.  Plan: XR Hand 2 View Right, XR Hand 2 View Left, the x-rays were consistent with osteoarthritis although the narrowing and subluxation of the MCP joints raises concern of any underlying inflammatory process.  Pain in both feet -feet x-rays were also consistent with severe osteoarthritis and possible inflammatory component due to subluxation of the joints.  Although no synovitis was noted on the examination.  Plan: XR Foot 2 Views Right, XR Foot 2 Views Left, x-rays were consistent with osteoarthritis.  MTP subluxation was noted.  But no significant joint space narrowing was noted.  Osteoarthritis of right glenohumeral joint -patient gives history of arthritis in bilateral shoulders.  History of total knee replacement, bilateral - At age 28 -he still has difficulty walking.  History of bilateral hip arthroplasty -he had good range of motion today.  Avascular necrosis of bone of hip, unspecified laterality ([place])   Degeneration of thoracic intervertebral disc -patient had thoracic fusion by Dr. [name] according to patient.   Spinal stenosis, lumbar region, with neurogenic claudication - Status post thoracic and a spinal fusion in 2017 by Dr. [name] per patient    Other medical problems are listed as follows:  Recurrent deep vein thrombosis (DVT) ([place])  Venous stasis  dermatitis of both lower extremities  Onychomycosis   Mixed hyperlipidemia  Hyperhomocystinemia (HCC)  Generalized anxiety disorder  Benign essential hypertension   Ischemic cardiomyopathy   NSTEMI (non-ST elevated myocardial infarction) (HCC)  PAF (paroxysmal atrial fibrillation) (HCC)  PVC (premature ventricular contraction)   Spontaneous dissection of coronary artery   Orders: Orders Placed This Encounter  Procedures  . XR Hand 2 View Right  . XR Hand 2 View Left  . XR Foot 2 Views Right  . XR Foot 2 Views Left  . Rheumatoid factor  . Sedimentation rate  . Uric acid  . Cyclic citrul peptide antibody, IgG  . 14-3-3 eta Protein  . ANA   No orders of the defined types were placed in this encounter.   Face-to-face time spent with patient was 60 minutes. Greater than 50% of time was spent in counseling and coordination of care.  Follow-Up Instructions: Return for Osteoarthritis, DDD.   Bo Merino, MD  Note - This record has been created using Editor, commissioning.  Chart creation errors have been sought, but may not always  have been located. Such creation errors do not reflect on  the standard of medical care.

## 2018-07-22 MED FILL — METOPROLOL SUCCINATE ER 100: 100 | 90 days supply | Qty: 90 | Fill #0

## 2018-07-22 MED FILL — ATORVASTATIN 80 MG TABLET: 80 | 90 days supply | Qty: 90 | Fill #0

## 2018-07-25 ENCOUNTER — Encounter: Payer: Self-pay | Admitting: Sports Medicine

## 2018-07-25 MED FILL — LISINOPRIL 20 MG TABLET: 20 | 90 days supply | Qty: 90 | Fill #0

## 2018-07-26 ENCOUNTER — Encounter: Payer: Self-pay | Admitting: Sports Medicine

## 2018-07-26 ENCOUNTER — Ambulatory Visit (INDEPENDENT_AMBULATORY_CARE_PROVIDER_SITE_OTHER): Payer: 59 | Admitting: Sports Medicine

## 2018-07-26 DIAGNOSIS — I10 Essential (primary) hypertension: Secondary | ICD-10-CM

## 2018-07-26 MED ORDER — LISINOPRIL-HYDROCHLOROTHIAZIDE 20-25 MG PO TABS: 1.0000 | ORAL_TABLET | Freq: Every day | ORAL | 3 refills | Status: DC

## 2018-07-26 NOTE — Assessment & Plan Note (Signed)
Switched from lisinopril HCTZ back to lisinopril when in the hospital, blood pressures have increased. He does get headaches, posterior with his elevated blood pressures without focal neurologic symptoms, had a negative brain MRI in 2018. He has no nausea, photophobia, or photophobia to suggest that this is migrainous. I simply think we need to bring his blood pressure back under control. He will do lisinopril/HCTZ for 2 weeks and then do another virtual visit with me to reevaluate.

## 2018-07-26 NOTE — Progress Notes (Signed)
Virtual Visit via WebEx/MyChart   I connected with  Travis Huff  on 07/26/18 via WebEx/MyChart/Doximity Video and verified that I am speaking with the correct person using two identifiers.   I discussed the limitations, risks, security and privacy concerns of performing an evaluation and management service by WebEx/MyChart/Doximity Video, including the higher likelihood of inaccurate diagnosis and treatment, and the availability of in person appointments.  We also discussed the likely need of an additional face to face encounter for complete and high quality delivery of care.  I also discussed with the patient that there may be a patient responsible charge related to this service. The patient expressed understanding and wishes to proceed.  Provider location is either at home or medical facility. Patient location is at their home, different from provider location. People involved in care of the patient during this telehealth encounter were myself, my nurse/medical assistant, and my front office/scheduling team member.  Subjective:    CC: Headaches  HPI: This is a pleasant 58 year old male, he has a history of hypertension, previously under control with metoprolol and lisinopril/HCTZ.  As is usual after multiple hospitalizations he lost some weight, multiple surgeries, blood pressure subsequently dropped, he was switched to lisinopril alone, now as he has been home, postop, healthy, healing well, his blood pressure has increased again.  Since then he has had posterior headaches with radiation to the temples, occasionally throbbing without photophobia, phonophobia, no nausea, no focal neurologic symptoms, no trauma.  Headaches will last for hours at a time and he links them to episodes when his blood pressure is high, it is high today and he tells me they have run like this consistently.  I reviewed the past medical history, family history, social history, surgical history, and allergies today  and no changes were needed.  Please see the problem list section below in epic for further details.  Past Medical History: Past Medical History:  Diagnosis Date  . Arthritis   . Asthma    as child  . Avascular necrosis of bone of left hip ([place])   . Back pain   . Blood clot in vein 06/2016   x 2 no bleeding disorders found  . Complication of anesthesia    ketamine hallucinations  . GERD (gastroesophageal reflux disease)   . Hypertension   . Viral pericarditis 20 yrs ago   Past Surgical History: Past Surgical History:  Procedure Laterality Date  . ABDOMINAL EXPOSURE N/A 06/23/2016   Procedure: ABDOMINAL EXPOSURE;  Surgeon: Rosetta Posner, MD;  Location: [place];  Service: Vascular;  Laterality: N/A;  . ANTERIOR LATERAL LUMBAR FUSION 4 LEVELS Right 06/23/2016   Procedure: Right  Lumbar two-three Lumbar three-four Lumbar four-five Anterolateral lumbar interbody fusion;  Surgeon: Erline Levine, MD;  Location: [place];  Service: Neurosurgery;  Laterality: Right;  . ANTERIOR LUMBAR FUSION N/A 06/23/2016   Procedure: Lumbar five -sacral one  Anterior lumbar interbody fusion with Dr. Sherren Mocha Early for approach;  Surgeon: Erline Levine, MD;  Location: [place];  Service: Neurosurgery;  Laterality: N/A;  . APPLICATION OF INTRAOPERATIVE CT SCAN N/A 06/26/2016   Procedure: APPLICATION OF INTRAOPERATIVE CT SCAN;  Surgeon: Erline Levine, MD;  Location: [place];  Service: Neurosurgery;  Laterality: N/A;  . CARDIOVASCULAR STRESS TEST     Negative in May 2014  . COLONOSCOPY    . HERNIA REPAIR     umbilical  . IR IVC FILTER PLMT / S&I Burke Keels GUID/MOD SED  03/26/2018  . JOINT REPLACEMENT    .  LEFT HEART CATH AND CORONARY ANGIOGRAPHY N/A 06/21/2018   Procedure: LEFT HEART CATH AND CORONARY ANGIOGRAPHY;  Surgeon: Jettie Booze, MD;  Location: [place] [facility];  Service: Cardiovascular;  Laterality: N/A;  . POSTERIOR LUMBAR FUSION 4 LEVEL N/A 06/26/2016   Procedure: Thoracic ten to Pelvis fixation with AIRO;   Surgeon: Erline Levine, MD;  Location: [place];  Service: Neurosurgery;  Laterality: N/A;  . TENDON REPAIR     right hand  . TOTAL HIP ARTHROPLASTY Right 05/25/2014   Procedure: RIGHT TOTAL HIP ARTHROPLASTY ANTERIOR APPROACH;  Surgeon: Rod Can, MD;  Location: [place];  Service: Orthopedics;  Laterality: Right;  . TOTAL HIP ARTHROPLASTY Left 03/27/2018   Procedure: TOTAL HIP ARTHROPLASTY ANTERIOR APPROACH- DA complex;  Surgeon: Rod Can, MD;  [facility];  Service: Orthopedics;  Laterality: Left;  . TOTAL KNEE ARTHROPLASTY Bilateral    Social History: Social History   Socioeconomic History  . Marital status: Married    Spouse name: Not on file  . Number of children: 2  . Years of education: 56  . Highest education level: Not on file  Occupational History  . Occupation: Public relations account executive  Social Needs  . Financial resource strain: Not on file  . Food insecurity    Worry: Not on file    Inability: Not on file  . Transportation needs    Medical: Not on file    Non-medical: Not on file  Tobacco Use  . Smoking status: Former Smoker    Packs/day: 1.00    Years: 25.00    Pack years: 25.00    Types: Cigarettes    Quit date: 2000    Years since quitting: 20.5  . Smokeless tobacco: Current User    Types: Snuff  Substance and Sexual Activity  . Alcohol use: Yes    Alcohol/week: 14.0 standard drinks    Types: 14 Cans of beer per week    Comment: 2 beers QD  . Drug use: No  . Sexual activity: Not on file  Lifestyle  . Physical activity    Days per week: Not on file    Minutes per session: Not on file  . Stress: Not on file  Relationships  . Social Herbalist on phone: Not on file    Gets together: Not on file    Attends religious service: Not on file    Active member of club or organization: Not on file    Attends meetings of clubs or organizations: Not on file    Relationship status: Not on file  Other Topics Concern  . Not on file  Social History  Narrative   Lives with wife in a one story home.  Has 2 children.  Works as a Public relations account executive.  Education: high school.    Family History: Family History  Adopted: Yes  Problem Relation Age of Onset  . Healthy Daughter    Allergies: Allergies  Allergen Reactions  . Ketamine     hallucinations  . Morphine And Related Itching   Medications: See med rec.  Review of Systems: No fevers, chills, night sweats, weight loss, chest pain, or shortness of breath.   Objective:    General: Speaking full sentences, no audible heavy breathing.  Sounds alert and appropriately interactive.  Appears well.  Face symmetric.  Extraocular movements intact.  Pupils equal and round.  No nasal flaring or accessory muscle use visualized.  No other physical exam performed due to the non-physical nature  of this visit.  Impression and Recommendations:    Benign essential hypertension Switched from lisinopril HCTZ back to lisinopril when in the hospital, blood pressures have increased. He does get headaches, posterior with his elevated blood pressures without focal neurologic symptoms, had a negative brain MRI in 2018. He has no nausea, photophobia, or photophobia to suggest that this is migrainous. I simply think we need to bring his blood pressure back under control. He will do lisinopril/HCTZ for 2 weeks and then do another virtual visit with me to reevaluate.  I discussed the above assessment and treatment plan with the patient. The patient was provided an opportunity to ask questions and all were answered. The patient agreed with the plan and demonstrated an understanding of the instructions.   The patient was advised to call back or seek an in-person evaluation if the symptoms worsen or if the condition fails to improve as anticipated.   I provided 25 minutes of non-face-to-face time during this encounter, 15 minutes of additional time was needed to gather information, review chart, records,  communicate/coordinate with staff remotely, troubleshooting the multiple errors that we get every time when trying to do video calls through the electronic medical record, WebEx, and Doximity, restart the encounter multiple times due to instability of the software, as well as complete documentation.   ___________________________________________ Gwen Her. Dianah Field, M.D., ABFM., CAQSM. Primary Care and Sports Medicine [place] [facility]  Adjunct Professor of [employer] [facility] [employer]

## 2018-07-31 ENCOUNTER — Ambulatory Visit: Payer: Self-pay

## 2018-07-31 ENCOUNTER — Other Ambulatory Visit: Payer: Self-pay

## 2018-07-31 ENCOUNTER — Encounter: Payer: Self-pay | Admitting: Rheumatology

## 2018-07-31 ENCOUNTER — Ambulatory Visit (INDEPENDENT_AMBULATORY_CARE_PROVIDER_SITE_OTHER): Payer: 59

## 2018-07-31 ENCOUNTER — Ambulatory Visit: Payer: 59 | Admitting: Rheumatology

## 2018-07-31 VITALS — BP 124/74 | HR 80 | Resp 16 | Ht 72.0 in | Wt 324.0 lb

## 2018-07-31 DIAGNOSIS — I10 Essential (primary) hypertension: Secondary | ICD-10-CM

## 2018-07-31 DIAGNOSIS — M12812 Other specific arthropathies, not elsewhere classified, left shoulder: Secondary | ICD-10-CM | POA: Diagnosis not present

## 2018-07-31 DIAGNOSIS — R7983 Abnormal findings of blood amino-acid level: Secondary | ICD-10-CM

## 2018-07-31 DIAGNOSIS — M5134 Other intervertebral disc degeneration, thoracic region: Secondary | ICD-10-CM | POA: Diagnosis not present

## 2018-07-31 DIAGNOSIS — M79642 Pain in left hand: Secondary | ICD-10-CM | POA: Diagnosis not present

## 2018-07-31 DIAGNOSIS — M79671 Pain in right foot: Secondary | ICD-10-CM | POA: Diagnosis not present

## 2018-07-31 DIAGNOSIS — Z96643 Presence of artificial hip joint, bilateral: Secondary | ICD-10-CM

## 2018-07-31 DIAGNOSIS — I480 Paroxysmal atrial fibrillation: Secondary | ICD-10-CM

## 2018-07-31 DIAGNOSIS — M19011 Primary osteoarthritis, right shoulder: Secondary | ICD-10-CM | POA: Diagnosis not present

## 2018-07-31 DIAGNOSIS — M79672 Pain in left foot: Secondary | ICD-10-CM | POA: Diagnosis not present

## 2018-07-31 DIAGNOSIS — F411 Generalized anxiety disorder: Secondary | ICD-10-CM

## 2018-07-31 DIAGNOSIS — M87059 Idiopathic aseptic necrosis of unspecified femur: Secondary | ICD-10-CM | POA: Diagnosis not present

## 2018-07-31 DIAGNOSIS — E7211 Homocystinuria: Secondary | ICD-10-CM

## 2018-07-31 DIAGNOSIS — M48062 Spinal stenosis, lumbar region with neurogenic claudication: Secondary | ICD-10-CM

## 2018-07-31 DIAGNOSIS — I493 Ventricular premature depolarization: Secondary | ICD-10-CM

## 2018-07-31 DIAGNOSIS — I2542 Coronary artery dissection: Secondary | ICD-10-CM

## 2018-07-31 DIAGNOSIS — Z96653 Presence of artificial knee joint, bilateral: Secondary | ICD-10-CM

## 2018-07-31 DIAGNOSIS — M79641 Pain in right hand: Secondary | ICD-10-CM

## 2018-07-31 DIAGNOSIS — I214 Non-ST elevation (NSTEMI) myocardial infarction: Secondary | ICD-10-CM

## 2018-07-31 DIAGNOSIS — I255 Ischemic cardiomyopathy: Secondary | ICD-10-CM

## 2018-07-31 DIAGNOSIS — E782 Mixed hyperlipidemia: Secondary | ICD-10-CM

## 2018-07-31 DIAGNOSIS — I872 Venous insufficiency (chronic) (peripheral): Secondary | ICD-10-CM

## 2018-07-31 DIAGNOSIS — I82409 Acute embolism and thrombosis of unspecified deep veins of unspecified lower extremity: Secondary | ICD-10-CM

## 2018-07-31 DIAGNOSIS — B351 Tinea unguium: Secondary | ICD-10-CM

## 2018-08-01 MED FILL — LISINOPRIL-HCTZ 20-25 MG TA: 20-25 | 90 days supply | Qty: 90 | Fill #0

## 2018-08-01 MED FILL — ZOLPIDEM TARTRATE 5 MG TAB: 5 | 30 days supply | Qty: 30 | Fill #1

## 2018-08-04 LAB — SEDIMENTATION RATE: Sed Rate: 6 mm/h (ref 0–20)

## 2018-08-04 LAB — CYCLIC CITRUL PEPTIDE ANTIBODY, IGG: Cyclic Citrullin Peptide Ab: <16 UNITS

## 2018-08-04 LAB — 14-3-3 ETA PROTEIN: 14-3-3 eta Protein: <0.2 ng/mL (ref <0.2)

## 2018-08-04 LAB — URIC ACID: Uric Acid, Serum: 7.6 mg/dL (ref 4.0–8.0)

## 2018-08-04 LAB — RHEUMATOID FACTOR: Rheumatoid fact SerPl-aCnc: <14 IU/mL (ref <14)

## 2018-08-04 LAB — ANA: Anti Nuclear Antibody (ANA): NEGATIVE

## 2018-08-06 ENCOUNTER — Encounter: Payer: Self-pay | Admitting: Cardiology

## 2018-08-06 ENCOUNTER — Other Ambulatory Visit: Payer: Self-pay

## 2018-08-06 ENCOUNTER — Ambulatory Visit: Payer: 59 | Admitting: Cardiology

## 2018-08-06 VITALS — BP 120/74 | HR 58 | Ht 72.0 in | Wt 325.0 lb

## 2018-08-06 DIAGNOSIS — I480 Paroxysmal atrial fibrillation: Secondary | ICD-10-CM | POA: Diagnosis not present

## 2018-08-06 MED ORDER — APIXABAN 5 MG PO TABS: 5.0000 mg | ORAL_TABLET | Freq: Two times a day (BID) | ORAL | 6 refills | Status: DC

## 2018-08-06 MED FILL — ELIQUIS 5 MG TABLET: 5 | 30 days supply | Qty: 60 | Fill #0

## 2018-08-06 NOTE — Patient Instructions (Addendum)
Medication Instructions:  Your physician has recommended you make the following change in your medication:  1. INCREASE Eliquis to 5 mg twice a day  * If you need a refill on your cardiac medications before your next appointment, please call your pharmacy.   Labwork: None ordered  Testing/Procedures: None ordered  Follow-Up: You are scheduled for follow up with Dr. Curt Bears on 11/11/18 @ 2:00 pm in the [facility] office.  Thank you for choosing [facility]!!   [name], RN [phone]  Any Other Special Instructions Will Be Listed Below (If Applicable).  Cardiac Ablation  Cardiac ablation is a procedure to stop some heart tissue from causing problems. The heart has many electrical connections. Sometimes these connections make the heart beat very fast or irregularly. Removing some problem areas can improve the heart rhythm or make it normal. What happens before the procedure?  Follow instructions from your doctor about what you cannot eat or drink.  Ask your doctor about: ? Changing or stopping your normal medicines. This is important if you take diabetes medicines or blood thinners. ? Taking medicines such as aspirin and ibuprofen. These medicines can thin your blood. Do not take these medicines before your procedure if your doctor tells you not to.  Plan to have someone take you home.  If you will be going home right after the procedure, plan to have someone with you for 24 hours. What happens during the procedure?  To lower your risk of infection: ? Your health care team will wash or sanitize their hands. ? Your skin will be washed with soap. ? Hair may be removed from your neck or groin.  An IV tube will be put into one of your veins.  You will be given a medicine to help you relax (sedative).  Skin on your neck or groin will be numbed.  A cut (incision) will be made in your neck or groin.  A needle will be put through your cut and into a vein in your  neck or groin.  A tube (catheter) will be put into the needle. The tube will be moved to your heart. X-rays (fluoroscopy) will be used to help guide the tube.  Small devices (electrodes) on the tip of the tube will send out electrical currents.  Dye may be put through the tube. This helps your surgeon see your heart.  Electrical energy will be used to scar (ablate) some heart tissue. Your surgeon may use: ? Heat (radiofrequency energy). ? Laser energy. ? Extreme cold (cryoablation).  The tube will be taken out.  Pressure will be held on your cut. This helps stop bleeding.  A bandage (dressing) will be put on your cut. The procedure may vary. What happens after the procedure?  You will be monitored until your medicines have worn off.  Your cut will be watched for bleeding. You will need to lie still for a few hours.  Do not drive for 24 hours or as long as your doctor tells you. Summary  Cardiac ablation is a procedure to stop some heart tissue from causing problems.  Electrical energy will be used to scar (ablate) some heart tissue. This information is not intended to replace advice given to you by your health care provider. Make sure you discuss any questions you have with your health care provider. Document Released: 08/21/2012 Document Revised: 12/01/2016 Document Reviewed: 11/08/2015 Elsevier Patient Education  2020 [employer].

## 2018-08-06 NOTE — Progress Notes (Signed)
Electrophysiology Office Note   Date:  08/06/2018   ID:  Travis Huff [birth year], MRN [id number]  PCP:  Silverio Decamp, MD  Cardiologist: Tamala Julian Primary Electrophysiologist:  Ivet Guerrieri Meredith Leeds, MD    No chief complaint on file.    History of Present Illness: Travis Huff is a 58 y.o. male who is being seen today for the evaluation of atrial fibrillation at the request of Belva Crome, MD. Presenting today for electrophysiology evaluation.  He has a history significant for anxiety, hypertension, obesity, multiple PEs on Eliquis status post IVC filter, non-STEMI due to coronary artery dissection, PAF, and chronic combined systolic and diastolic heart failure due to ischemic cardiomyopathy.  He had a recent hospitalization for palpitations, weakness, and syncope.  He presented to his primary physician's office and was found to have an elevated troponin on labs.  His primary physician called him and advised him to go to the hospital.  Hospital work-up identified apical narrowing of the LAD which was due to coronary dissection.  He wore a cardiac monitor for evaluation of near syncope which found rapid atrial fibrillation.  His metoprolol dose was increased since that time.  Today, he denies symptoms of palpitations, chest pain, shortness of breath, orthopnea, PND, lower extremity edema, claudication, dizziness, presyncope, syncope, bleeding, or neurologic sequela. The patient is tolerating medications without difficulties.    Past Medical History:  Diagnosis Date  . Arthritis   . Asthma    as child  . Avascular necrosis of bone of left hip ([place])   . Back pain   . Blood clot in vein 06/2016   x 2 no bleeding disorders found  . Complication of anesthesia    ketamine hallucinations  . GERD (gastroesophageal reflux disease)   . Hypertension   . Viral pericarditis 20 yrs ago   Past Surgical History:  Procedure Laterality Date  . ABDOMINAL EXPOSURE N/A 06/23/2016   Procedure: ABDOMINAL EXPOSURE;  Surgeon: Rosetta Posner, MD;  Location: [place];  Service: Vascular;  Laterality: N/A;  . ANTERIOR LATERAL LUMBAR FUSION 4 LEVELS Right 06/23/2016   Procedure: Right  Lumbar two-three Lumbar three-four Lumbar four-five Anterolateral lumbar interbody fusion;  Surgeon: Erline Levine, MD;  Location: [place];  Service: Neurosurgery;  Laterality: Right;  . ANTERIOR LUMBAR FUSION N/A 06/23/2016   Procedure: Lumbar five -sacral one  Anterior lumbar interbody fusion with Dr. Sherren Mocha Early for approach;  Surgeon: Erline Levine, MD;  Location: [place];  Service: Neurosurgery;  Laterality: N/A;  . APPLICATION OF INTRAOPERATIVE CT SCAN N/A 06/26/2016   Procedure: APPLICATION OF INTRAOPERATIVE CT SCAN;  Surgeon: Erline Levine, MD;  Location: [place];  Service: Neurosurgery;  Laterality: N/A;  . CARDIOVASCULAR STRESS TEST     Negative in May 2014  . COLONOSCOPY    . HERNIA REPAIR     umbilical  . IR IVC FILTER PLMT / S&I Burke Keels GUID/MOD SED  03/26/2018  . JOINT REPLACEMENT    . LEFT HEART CATH AND CORONARY ANGIOGRAPHY N/A 06/21/2018   Procedure: LEFT HEART CATH AND CORONARY ANGIOGRAPHY;  Surgeon: Jettie Booze, MD;  Location: [facility] [facility];  Service: Cardiovascular;  Laterality: N/A;  . POSTERIOR LUMBAR FUSION 4 LEVEL N/A 06/26/2016   Procedure: Thoracic ten to Pelvis fixation with AIRO;  Surgeon: Erline Levine, MD;  Location: [place];  Service: Neurosurgery;  Laterality: N/A;  . TENDON REPAIR     right hand  . TOTAL HIP ARTHROPLASTY Right 05/25/2014  Procedure: RIGHT TOTAL HIP ARTHROPLASTY ANTERIOR APPROACH;  Surgeon: Rod Can, MD;  Location: [place];  Service: Orthopedics;  Laterality: Right;  . TOTAL HIP ARTHROPLASTY Left 03/27/2018   Procedure: TOTAL HIP ARTHROPLASTY ANTERIOR APPROACH- DA complex;  Surgeon: Rod Can, MD;  Location: [facility];  Service: Orthopedics;  Laterality: Left;  . TOTAL KNEE ARTHROPLASTY Bilateral      Current Outpatient Medications  Medication  Sig Dispense Refill  . AMBULATORY NON FORMULARY MEDICATION Knee-high, medium compression, graduated compression stockings. Apply to lower extremities. [email or web address], Zippered Compression Stockings, medium circ, long length 1 each 0  . atorvastatin (LIPITOR) 80 MG tablet Take 1 tablet (80 mg total) by mouth daily at 6 PM. 90 tablet 3  . calcium carbonate (TUMS - DOSED IN MG ELEMENTAL CALCIUM) 500 MG chewable tablet Chew 3-4 tablets by mouth 2 (two) times daily as needed (stomach cramps).     . cyclobenzaprine (FLEXERIL) 10 MG tablet Take 10 mg by mouth 3 (three) times daily as needed for muscle spasms.    [name] docusate sodium (COLACE) 100 MG capsule Take 1 capsule (100 mg total) by mouth 2 (two) times daily. 60 capsule 1  . DULoxetine (CYMBALTA) 60 MG capsule Take 1 capsule (60 mg total) by mouth at bedtime. 90 capsule 3  . lisinopril-hydrochlorothiazide (ZESTORETIC) 20-25 MG tablet Take 1 tablet by mouth daily. 90 tablet 3  . metoprolol succinate (TOPROL-XL) 100 MG 24 hr tablet Take 1 tablet (100 mg total) by mouth daily. Take with or immediately following a meal. 90 tablet 3  . metoprolol succinate (TOPROL-XL) 50 MG 24 hr tablet Take 1 tablet (50 mg total) by mouth daily. In addition to your 100mg  tablet. Take with or immediately following a meal. 90 tablet 3  . nitroGLYCERIN (NITROSTAT) 0.4 MG SL tablet Place 1 tablet (0.4 mg total) under the tongue every 5 (five) minutes x 3 doses as needed for chest pain. If taking third dose, call 911. 25 tablet 3  . polyethylene glycol (MIRALAX / GLYCOLAX) 17 g packet Take 17 g by mouth daily as needed for mild constipation.    . terbinafine (LAMISIL) 250 MG tablet Take 250 mg by mouth daily.    [name] zolpidem (AMBIEN) 5 MG tablet Take 1 tablet (5 mg total) by mouth at bedtime as needed for sleep. 30 tablet 3  . apixaban (ELIQUIS) 5 MG TABS tablet Take 1 tablet (5 mg total) by mouth 2 (two) times daily. 60 tablet 6   No current facility-administered  medications for this visit.     Allergies:   Ketamine and Morphine and related   Social History:  The patient  reports that he quit smoking about 20 years ago. His smoking use included cigarettes. He has a 25.00 pack-year smoking history. His smokeless tobacco use includes snuff. He reports current alcohol use of about 14.0 standard drinks of alcohol per week. He reports that he does not use drugs.   Family History:  The patient's family history includes Healthy in his daughter and daughter. He was adopted.    ROS:  Please see the history of present illness.   Otherwise, review of systems is positive for none.   All other systems are reviewed and negative.    PHYSICAL EXAM: VS:  BP 120/74   Pulse (!) 58   Ht 6' (1.829 m)   Wt (!) 325 lb (147.4 kg)   BMI 44.08 kg/m  , BMI Body mass index is 44.08 kg/m. GEN: Well nourished, well developed, in  no acute distress  HEENT: normal  Neck: no JVD, carotid bruits, or masses Cardiac: RRR; no murmurs, rubs, or gallops,no edema  Respiratory:  clear to auscultation bilaterally, normal work of breathing GI: soft, nontender, nondistended, + BS MS: no deformity or atrophy  Skin: warm and dry Neuro:  Strength and sensation are intact Psych: euthymic mood, full affect  EKG:  EKG is ordered today. Personal review of the ekg ordered shows sinus rhythm, incomplete right bundle branch block, rate 58  Recent Labs: 06/19/2018: ALT 32 06/20/2018: B Natriuretic Peptide 676.6; Magnesium 2.0; TSH 4.369 06/21/2018: BUN 13; Creatinine, Ser 0.99; Potassium 4.6; Sodium 138 06/22/2018: Hemoglobin 14.0; Platelets 300    Lipid Panel     Component Value Date/Time   CHOL 219 (H) 06/19/2018 1109   TRIG 81 06/19/2018 1109   HDL 75 06/19/2018 1109   CHOLHDL 2.9 06/19/2018 1109   VLDL 15 05/23/2012 0610   LDLCALC 126 (H) 06/19/2018 1109     Wt Readings from Last 3 Encounters:  08/06/18 (!) 325 lb (147.4 kg)  07/31/18 (!) 324 lb (147 kg)  07/10/18 (!) 322  lb 6.4 oz (146.2 kg)      Other studies Reviewed: Additional studies/ records that were reviewed today include: TTE 06/21/18  Review of the above records today demonstrates:   1. Severe hypokinesis of the left ventricular, mid-apical anterolateral wall.  2. The left ventricle has mild-moderately reduced systolic function, with an ejection fraction of 40-45%. The cavity size was mildly dilated. There is mild concentric left ventricular hypertrophy. Left ventricular diastolic Doppler parameters are  indeterminate. Left ventricular diffuse hypokinesis.  3. No evidence of mitral valve stenosis.  4. The tricuspid valve is grossly normal.  5. The aortic valve is grossly normal. No stenosis of the aortic valve.  6. The aortic root, aortic arch and ascending aorta are normal in size and structure.  LHC 06/21/18  Dist LAD lesion is 75% stenosed. 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  There is mild left ventricular systolic dysfunction.  The left ventricular ejection fraction is 35-45% by visual estimate.  There is no aortic valve stenosis.    ASSESSMENT AND PLAN:  1.  Paroxysmal atrial fibrillation: Currently on Eliquis.  He is currently feeling well and is in sinus rhythm.  His metoprolol dose was recently increased which is greatly reduced his palpitations.  Would continue with his current dose.  At this point, he would be amenable to ablation, but as he is feeling well, we Barba Solt continue with his current metoprolol.  We Marijo Quizon also increase his Eliquis as his stroke risk is elevated due to his coronary artery disease as well as his ischemic cardiomyopathy.  This patients CHA2DS2-VASc Score and unadjusted Ischemic Stroke Rate (% per year) is equal to 2.2 % stroke rate/year from a score of 2  Above score calculated as 1 point each if present [CHF, HTN, DM, Vascular=MI/PAD/Aortic Plaque, Age if 65-74, or Male] Above score calculated as 2 points each if present [Age > 75, or  Stroke/TIA/TE]  2.  Coronary artery disease: Has a history of LAD dissection.  No current chest pain.  Continue per primary cardiology  3.  Ischemic cardiomyopathy: Plan for repeat echo in September.    Current medicines are reviewed at length with the patient today.   The patient does not have concerns regarding his medicines.  The following changes were made today: Increase Eliquis to 5 mg  Labs/ tests ordered today include:  Orders Placed This  Encounter  Procedures  . EKG 12-Lead   Case discussed with referring cardiologist  Disposition:   FU with Lilianne Delair 3 months  Signed, Annelie Boak Meredith Leeds, MD  08/06/2018 2:20 PM     [facility] [address] [place] [address] [phone] (office) [phone] (fax)

## 2018-08-07 ENCOUNTER — Other Ambulatory Visit (HOSPITAL_COMMUNITY): Payer: 59

## 2018-08-09 ENCOUNTER — Encounter: Payer: Self-pay | Admitting: *Deleted

## 2018-08-09 MED FILL — ZOLPIDEM TARTRATE 5 MG TAB: 5 | 30 days supply | Qty: 30 | Fill #1

## 2018-08-14 ENCOUNTER — Telehealth: Payer: Self-pay

## 2018-08-14 NOTE — Telephone Encounter (Signed)
Gillian called and left a message for a return call. I called and left a message for a return call.

## 2018-08-15 ENCOUNTER — Encounter: Payer: Self-pay | Admitting: Sports Medicine

## 2018-08-15 DIAGNOSIS — M1812 Unilateral primary osteoarthritis of first carpometacarpal joint, left hand: Secondary | ICD-10-CM

## 2018-08-15 DIAGNOSIS — B351 Tinea unguium: Secondary | ICD-10-CM

## 2018-08-16 NOTE — Progress Notes (Signed)
Office Visit Note  Patient: Travis Huff             Date of Birth: 1960/03/19           MRN: [id number]             PCP: Silverio Decamp, MD Referring: Silverio Decamp,* Visit Date: 08/29/2018 Occupation: @GUAROCC @  Subjective:  Pain in multiple joints   History of Present Illness: Travis Huff is a 58 y.o. male with history of osteoarthritis and DDD.  He has chronic lower back pain but overall his lower back has been doing better.  He walks with a cane.  He has pain in both shoulder joints, worse in the right shoulder joint. He is apprehensive to schedule a shoulder replacement at this time.  He has tried cortisone injections in the past without significant relief. He reports both knee replacements and both hip replacements are doing well.  He denies any joint swelling. He takes hydromorphone for pain relief.    Activities of Daily Living:  Patient reports morning stiffness for 0 minutes.   Patient Reports nocturnal pain.  Difficulty dressing/grooming: Denies Difficulty climbing stairs: Reports Difficulty getting out of chair: Denies Difficulty using hands for taps, buttons, cutlery, and/or writing: Denies  Review of Systems  Constitutional: Positive for fatigue. Negative for night sweats.  HENT: Negative for mouth sores, mouth dryness and nose dryness.   Eyes: Negative for redness and dryness.  Respiratory: Negative for cough, hemoptysis, shortness of breath and difficulty breathing.   Cardiovascular: Negative for chest pain, palpitations, hypertension, irregular heartbeat and swelling in legs/feet.  Gastrointestinal: Negative for blood in stool, constipation and diarrhea.  Endocrine: Negative for increased urination.  Genitourinary: Negative for painful urination.  Musculoskeletal: Positive for arthralgias and joint pain. Negative for joint swelling, myalgias, muscle weakness, morning stiffness, muscle tenderness and myalgias.  Skin: Negative for color  change, rash, hair loss, nodules/bumps, skin tightness, ulcers and sensitivity to sunlight.  Allergic/Immunologic: Negative for susceptible to infections.  Neurological: Negative for dizziness, fainting, memory loss, night sweats and weakness.  Hematological: Negative for swollen glands.  Psychiatric/Behavioral: Negative for depressed mood and sleep disturbance. The patient is not nervous/anxious.     PMFS History:  Patient Active Problem List   Diagnosis Date Noted  . PAF (paroxysmal atrial fibrillation) ([place]) 07/04/2018  . Spontaneous dissection of coronary artery 06/22/2018  . Ischemic cardiomyopathy 06/22/2018  . Near syncope 06/20/2018  . Tachycardia 06/20/2018  . Nausea 06/20/2018  . PVC (premature ventricular contraction) 06/20/2018  . History of DVT (deep vein thrombosis) 06/20/2018  . NSTEMI (non-ST elevated myocardial infarction) ([place]) 06/20/2018  . Onychomycosis 06/19/2018  . Venous stasis dermatitis of both lower extremities 06/19/2018  . Insomnia 06/19/2018  . Osteoarthritis of right glenohumeral joint 06/17/2018  . Rotator cuff arthropathy of left shoulder 06/17/2018  . History of bilateral hip arthroplasty 03/27/2018  . Avascular necrosis of bone of hip ([place]) 03/27/2018  . Recurrent deep vein thrombosis (DVT) ([place]) 02/12/2018  . Health counseling 02/12/2018  . Hyperhomocystinemia ([place]) 01/10/2018  . Cellulitis of right foot 01/08/2018  . Intermittent claudication ([place]) 12/04/2017  . Fissure in skin 12/04/2017  . Acute deep vein thrombosis (DVT) of calf muscle vein of right lower extremity 11/26/2017  . Acute deep vein thrombosis of lower limb ([address]) 11/26/2017  . Primary osteoarthritis of first carpometacarpal joint of left hand 11/21/2017  . Hand cramps 10/25/2017  . High frequency hearing loss, left 09/10/2017  . Degeneration of thoracic  intervertebral disc 05/30/2017  . Mixed hyperlipidemia 04/18/2017  . Increased creatine kinase level 04/18/2017  . Scoliosis  of thoracolumbar spine 06/23/2016  . Anxiety, generalized 05/11/2016  . Generalized anxiety disorder 05/11/2016  . Benign essential hypertension 03/26/2014  . Spinal stenosis, lumbar region, with neurogenic claudication 12/21/2013  . Morbid obesity ([place]) 08/01/2012  . S/P total knee arthroplasty 01/26/2012  . Erectile dysfunction 01/26/2012  . Annual physical exam 01/26/2012  . Impotence 01/26/2012    Past Medical History:  Diagnosis Date  . Arthritis   . Asthma    as child  . Avascular necrosis of bone of left hip ([place])   . Back pain   . Blood clot in vein 06/2016   x 2 no bleeding disorders found  . Complication of anesthesia    ketamine hallucinations  . GERD (gastroesophageal reflux disease)   . Hypertension   . Viral pericarditis 20 yrs ago    Family History  Adopted: Yes  Problem Relation Age of Onset  . Healthy Daughter   . Healthy Daughter    Past Surgical History:  Procedure Laterality Date  . ABDOMINAL EXPOSURE N/A 06/23/2016   Procedure: ABDOMINAL EXPOSURE;  Surgeon: Rosetta Posner, MD;  Location: [facility];  Service: Vascular;  Laterality: N/A;  . ANTERIOR LATERAL LUMBAR FUSION 4 LEVELS Right 06/23/2016   Procedure: Right  Lumbar two-three Lumbar three-four Lumbar four-five Anterolateral lumbar interbody fusion;  Surgeon: Erline Levine, MD;  Location: [facility];  Service: Neurosurgery;  Laterality: Right;  . ANTERIOR LUMBAR FUSION N/A 06/23/2016   Procedure: Lumbar five -sacral one  Anterior lumbar interbody fusion with Dr. Sherren Mocha Early for approach;  Surgeon: Erline Levine, MD;  Location: [facility];  Service: Neurosurgery;  Laterality: N/A;  . APPLICATION OF INTRAOPERATIVE CT SCAN N/A 06/26/2016   Procedure: APPLICATION OF INTRAOPERATIVE CT SCAN;  Surgeon: Erline Levine, MD;  Location: [facility];  Service: Neurosurgery;  Laterality: N/A;  . CARDIOVASCULAR STRESS TEST     Negative in May 2014  . COLONOSCOPY    . HERNIA REPAIR     umbilical  . IR IVC FILTER PLMT / S&I Burke Keels GUID/MOD  SED  03/26/2018  . JOINT REPLACEMENT    . LEFT HEART CATH AND CORONARY ANGIOGRAPHY N/A 06/21/2018   Procedure: LEFT HEART CATH AND CORONARY ANGIOGRAPHY;  Surgeon: Jettie Booze, MD;  Location: [facility];  Service: Cardiovascular;  Laterality: N/A;  . POSTERIOR LUMBAR FUSION 4 LEVEL N/A 06/26/2016   Procedure: Thoracic ten to Pelvis fixation with AIRO;  Surgeon: Erline Levine, MD;  Location: [facility];  Service: Neurosurgery;  Laterality: N/A;  . TENDON REPAIR     right hand  . TOTAL HIP ARTHROPLASTY Right 05/25/2014   Procedure: RIGHT TOTAL HIP ARTHROPLASTY ANTERIOR APPROACH;  Surgeon: Rod Can, MD;  Location: [facility];  Service: Orthopedics;  Laterality: Right;  . TOTAL HIP ARTHROPLASTY Left 03/27/2018   Procedure: TOTAL HIP ARTHROPLASTY ANTERIOR APPROACH- DA complex;  Surgeon: Rod Can, MD;  Location: [facility];  Service: Orthopedics;  Laterality: Left;  . TOTAL KNEE ARTHROPLASTY Bilateral    Social History   Social History Narrative   Lives with wife in a one story home.  Has 2 children.  Works as a Public relations account executive.  Education: high school.    Immunization History  Administered Date(s) Administered  . Influenza Whole 01/13/2011  . Influenza,inj,Quad PF,6+ Mos 10/23/2012, 11/14/2017  . Tdap 11/29/2016  . Zoster Recombinat (Shingrix) 06/19/2018, 08/22/2018     Objective: Vital Signs:  BP (!) 147/92 (BP Location: Left Arm, Patient Position: Sitting, Cuff Size: Large)   Pulse 72   Resp 15   Ht 6' (1.829 m)   Wt (!) 326 lb (147.9 kg)   BMI 44.21 kg/m    Physical Exam Vitals signs and nursing note reviewed.  Constitutional:      Appearance: He is well-developed.  HENT:     Head: Normocephalic and atraumatic.  Eyes:     Conjunctiva/sclera: Conjunctivae normal.     Pupils: Pupils are equal, round, and reactive to light.  Neck:     Musculoskeletal: Normal range of motion and neck supple.  Cardiovascular:     Rate and Rhythm: Normal rate and regular rhythm.      Heart sounds: Normal heart sounds.  Pulmonary:     Effort: Pulmonary effort is normal.     Breath sounds: Normal breath sounds.  Abdominal:     General: Bowel sounds are normal.     Palpations: Abdomen is soft.  Skin:    General: Skin is warm and dry.     Capillary Refill: Capillary refill takes less than 2 seconds.  Neurological:     Mental Status: He is alert and oriented to person, place, and time.  Psychiatric:        Behavior: Behavior normal.      Musculoskeletal Exam: Shoulder joints have good ROM with discomfort bilaterally.  Elbow joints good ROM with no tenderness or inflammation.  Wrist joints, MCPs, PIPs, and DIPs good ROM with no synovitis.  Right CMC joint surgery and tendon repair.   Hip replacements have good ROM with no discomfort. Good ROM of bilateral knee replacements.  No warmth or effusion of knee joints.  Hammertoes present.    CDAI Exam: CDAI Score: - Patient Global: -; Provider Global: - Swollen: -; Tender: - Joint Exam   No joint exam has been documented for this visit   There is currently no information documented on the homunculus. Go to the Rheumatology activity and complete the homunculus joint exam.  Investigation: No additional findings.  Imaging: Xr Foot 2 Views Left  Result Date: 07/31/2018 First MTP PIP and DIP narrowing was noted.  Subluxation of some of the MTP joints was noted.  No erosive changes were noted.  Intertarsal joint space narrowing and dorsal spurring was noted.  Inferior and posterior calcaneal spur was noted.  Tibiotalar joint space narrowing was noted. Impression: These findings are consistent with osteoarthritis and possible inflammatory arthritis.  Xr Foot 2 Views Right  Result Date: 07/31/2018 First MTP valgus deformity was noted.  Subluxation of second third and fourth MTP joints was noted.  No significant joint space narrowing was noted.  No erosive changes were noted.  PIP and DIP narrowing was noted.  Inferior and  posterior calcaneal spurs were noted. Impression: These findings are consistent with osteoarthritis of the foot.  Xr Hand 2 View Left  Result Date: 07/31/2018 Narrowing of second and third MCP joint with subluxation of third MCP joint was noted.  PIP and DIP narrowing was noted.  Narrowing of CMC joint with a spurring was noted.  Radiocarpal joint space narrowing was noted.  Some cystic changes were noted in the carpal bones. Impression: These findings are consistent with osteoarthritis and possible inflammatory arthritis overlap.  Xr Hand 2 View Right  Result Date: 07/31/2018 Postsurgical change in the right [facility] joint was noted.  Subluxation of first, second and third MCP joint with narrowing was noted.  PIP and DIP  narrowing was noted.  No intercarpal or radiocarpal joint space narrowing was noted.  No erosive changes were noted. Impression: These findings are consistent with osteoarthritis and raises suspicion of inflammatory arthritis.   Recent Labs: Lab Results  Component Value Date   WBC 9.1 06/22/2018   HGB 14.0 06/22/2018   PLT 300 06/22/2018   NA 138 06/21/2018   K 4.6 06/21/2018   CL 99 06/21/2018   CO2 28 06/21/2018   GLUCOSE 115 (H) 06/21/2018   BUN 13 06/21/2018   CREATININE 0.99 06/21/2018   BILITOT 0.3 06/19/2018   ALKPHOS 88 02/12/2018   AST 32 06/19/2018   ALT 32 06/19/2018   PROT 6.9 06/19/2018   ALBUMIN 4.4 02/12/2018   CALCIUM 9.6 06/21/2018   GFRAA >60 06/21/2018  July 31, 2018 RF negative, anti-CCP negative, 14 3 3  eta negative, ANA negative, uric acid 7.6, ESR 6  Speciality Comments: No specialty comments available.  Procedures:  No procedures performed Allergies: Ketamine and Morphine and related   Assessment / Plan:     Visit Diagnoses: Primary osteoarthritis of both hands - Clinical and radiographic findings are consistent with osteoarthritis.  He has right CMC subluxation despite having surgery in the past: Results from 07/31/18 were reviewed with  the patient: ANA-, 14-3-3 eta protein negative, CCP-, RF-, sed rate WNL, uric acid WNL.  He has no synovitis on exam.   He has no tenderness or inflammation on exam.  He has PIP and DIP synovial thickening consistent with osteoarthritis.  Complete fist formation bilaterally.  Joint protection and muscle strengthening were discussed.  He was advised to notify us if he develops increased joint pain or joint swelling.  He will follow up in 5 months.   Osteoarthritis of right glenohumeral joint: He has good ROM with no discomfort.  No warmth or effusion noted.  He has chronic right shoulder joint pain.  He has had cortisone injections in the past, which provided temporary relief.  He is not ready to proceed with a right total shoulder replacement at this time.  He was given a handout of shoulder exercise to perform.    History of bilateral hip arthroplasty: Doing well.  He has good ROM with no discomfort.    History of total knee replacement, bilateral: He has good ROM with no discomfort.  No warmth or effusion noted.  He walks with a cane to assist with ambulation.    Primary osteoarthritis of both feet: He has PIP and DIP synovial thickening and hammertoes present.  He experiences pain in both feet at night.  We discussed the importance of wearing proper fitting shoes.   Degeneration of thoracic intervertebral disc - Status post thoracic fusion by Dr. [name].    Spinal stenosis, lumbar region, with neurogenic claudication: He has chronic lower back pain.  He walks with a cane to assist with ambulation.   Other medical conditions are listed as follows:   Recurrent deep vein thrombosis (DVT) (H[facility])  Venous stasis dermatitis of both lower extremities  Mixed hyperlipidemia  Hyperhomocystinemia ([facility])  Generalized anxiety disorder  Onychomycosis  Benign essential hypertension  Ischemic cardiomyopathy  PAF (paroxysmal atrial fibrillation) ([facility])  PVC (premature ventricular contraction)   Spontaneous dissection of coronary artery  Orders: No orders of the defined types were placed in this encounter.  No orders of the defined types were placed in this encounter.  Follow-Up Instructions: Return if symptoms worsen or fail to improve, for Osteoarthritis.   Ofilia Neas, PA-C  I examined and evaluated the patient with Hazel Sams PA.  Patient continues to have a lot of discomfort in multiple joints.  He states he has replaced knee joints and hip joints are going well.  He has bilateral hammertoes.  I have advised him to schedule an appointment with a podiatrist or orthopedic surgeon.  He will benefit from orthotis.  He continues to have a lot of discomfort in his right shoulder.  Although he had good range of motion.  Have encouraged him to do regular exercises.  All autoimmune labs were negative which were discussed at length.  The plan of care was discussed as noted above.  Bo Merino, MD  Note - This record has been created using Editor, commissioning.  Chart creation errors have been sought, but may not always  have been located. Such creation errors do not reflect on  the standard of medical care.

## 2018-08-16 NOTE — Telephone Encounter (Signed)
Referral pended, please review and sign if ok

## 2018-08-20 MED FILL — CYCLOBENZAPRINE HCL 10 MG T: 10 | 90 days supply | Qty: 360 | Fill #1

## 2018-08-22 ENCOUNTER — Ambulatory Visit (INDEPENDENT_AMBULATORY_CARE_PROVIDER_SITE_OTHER): Payer: 59 | Admitting: Sports Medicine

## 2018-08-22 ENCOUNTER — Other Ambulatory Visit: Payer: Self-pay

## 2018-08-22 VITALS — Temp 98.8°F

## 2018-08-22 DIAGNOSIS — Z23 Encounter for immunization: Secondary | ICD-10-CM | POA: Diagnosis not present

## 2018-08-22 NOTE — Progress Notes (Signed)
Shingrix given today

## 2018-08-28 ENCOUNTER — Ambulatory Visit (HOSPITAL_COMMUNITY): Payer: 59

## 2018-08-28 ENCOUNTER — Other Ambulatory Visit: Payer: Self-pay

## 2018-08-28 ENCOUNTER — Telehealth (HOSPITAL_COMMUNITY): Payer: Self-pay | Admitting: Pharmacist

## 2018-08-28 NOTE — Telephone Encounter (Signed)
Cardiac Rehab Medication Review by a Pharmacist  Does the patient  feel that his/her medications are working for him/her?  yes  Has the patient been experiencing any side effects to the medications prescribed?  no  Does the patient measure his/her own blood pressure or blood glucose at home?  yes   Average 130/80s  Does the patient have any problems obtaining medications due to transportation or finances?   no  Understanding of regimen: excellent Understanding of indications: excellent Potential of compliance: excellent   Lorel Monaco, PharmD PGY1 Ambulatory Care Resident [name] # [phone]

## 2018-08-29 ENCOUNTER — Encounter: Payer: Self-pay | Admitting: Rheumatology

## 2018-08-29 ENCOUNTER — Ambulatory Visit: Payer: 59 | Admitting: Rheumatology

## 2018-08-29 VITALS — BP 147/92 | HR 72 | Resp 15 | Ht 72.0 in | Wt 326.0 lb

## 2018-08-29 DIAGNOSIS — M48062 Spinal stenosis, lumbar region with neurogenic claudication: Secondary | ICD-10-CM

## 2018-08-29 DIAGNOSIS — I480 Paroxysmal atrial fibrillation: Secondary | ICD-10-CM

## 2018-08-29 DIAGNOSIS — M5134 Other intervertebral disc degeneration, thoracic region: Secondary | ICD-10-CM | POA: Diagnosis not present

## 2018-08-29 DIAGNOSIS — Z96653 Presence of artificial knee joint, bilateral: Secondary | ICD-10-CM

## 2018-08-29 DIAGNOSIS — I10 Essential (primary) hypertension: Secondary | ICD-10-CM

## 2018-08-29 DIAGNOSIS — Z96643 Presence of artificial hip joint, bilateral: Secondary | ICD-10-CM | POA: Diagnosis not present

## 2018-08-29 DIAGNOSIS — E782 Mixed hyperlipidemia: Secondary | ICD-10-CM

## 2018-08-29 DIAGNOSIS — M19071 Primary osteoarthritis, right ankle and foot: Secondary | ICD-10-CM | POA: Diagnosis not present

## 2018-08-29 DIAGNOSIS — I872 Venous insufficiency (chronic) (peripheral): Secondary | ICD-10-CM | POA: Diagnosis not present

## 2018-08-29 DIAGNOSIS — I255 Ischemic cardiomyopathy: Secondary | ICD-10-CM

## 2018-08-29 DIAGNOSIS — I82409 Acute embolism and thrombosis of unspecified deep veins of unspecified lower extremity: Secondary | ICD-10-CM | POA: Diagnosis not present

## 2018-08-29 DIAGNOSIS — M19011 Primary osteoarthritis, right shoulder: Secondary | ICD-10-CM | POA: Diagnosis not present

## 2018-08-29 DIAGNOSIS — E7211 Homocystinuria: Secondary | ICD-10-CM

## 2018-08-29 DIAGNOSIS — M19072 Primary osteoarthritis, left ankle and foot: Secondary | ICD-10-CM

## 2018-08-29 DIAGNOSIS — I493 Ventricular premature depolarization: Secondary | ICD-10-CM

## 2018-08-29 DIAGNOSIS — I2542 Coronary artery dissection: Secondary | ICD-10-CM

## 2018-08-29 DIAGNOSIS — F411 Generalized anxiety disorder: Secondary | ICD-10-CM

## 2018-08-29 DIAGNOSIS — B351 Tinea unguium: Secondary | ICD-10-CM

## 2018-08-29 DIAGNOSIS — M19042 Primary osteoarthritis, left hand: Secondary | ICD-10-CM

## 2018-08-29 DIAGNOSIS — M19041 Primary osteoarthritis, right hand: Secondary | ICD-10-CM | POA: Diagnosis not present

## 2018-08-29 NOTE — Patient Instructions (Signed)
Shoulder Exercises Ask your health care provider which exercises are safe for you. Do exercises exactly as told by your health care provider and adjust them as directed. It is normal to feel mild stretching, pulling, tightness, or discomfort as you do these exercises. Stop right away if you feel sudden pain or your pain gets worse. Do not begin these exercises until told by your health care provider. Stretching exercises External rotation and abduction This exercise is sometimes called corner stretch. This exercise rotates your arm outward (external rotation) and moves your arm out from your body (abduction). 1. Stand in a doorway with one of your feet slightly in front of the other. This is called a staggered stance. If you cannot reach your forearms to the door frame, stand facing a corner of a room. 2. Choose one of the following positions as told by your health care provider: ? Place your hands and forearms on the door frame above your head. ? Place your hands and forearms on the door frame at the height of your head. ? Place your hands on the door frame at the height of your elbows. 3. Slowly move your weight onto your front foot until you feel a stretch across your chest and in the front of your shoulders. Keep your head and chest upright and keep your abdominal muscles tight. 4. Hold for __________ seconds. 5. To release the stretch, shift your weight to your back foot. Repeat __________ times. Complete this exercise __________ times a day. Extension, standing 1. Stand and hold a broomstick, a cane, or a similar object behind your back. ? Your hands should be a little wider than shoulder width apart. ? Your palms should face away from your back. 2. Keeping your elbows straight and your shoulder muscles relaxed, move the stick away from your body until you feel a stretch in your shoulders (extension). ? Avoid shrugging your shoulders while you move the stick. Keep your shoulder blades tucked  down toward the middle of your back. 3. Hold for __________ seconds. 4. Slowly return to the starting position. Repeat __________ times. Complete this exercise __________ times a day. Range-of-motion exercises Pendulum  1. Stand near a wall or a surface that you can hold onto for balance. 2. Bend at the waist and let your left / right arm hang straight down. Use your other arm to support you. Keep your back straight and do not lock your knees. 3. Relax your left / right arm and shoulder muscles, and move your hips and your trunk so your left / right arm swings freely. Your arm should swing because of the motion of your body, not because you are using your arm or shoulder muscles. 4. Keep moving your hips and trunk so your arm swings in the following directions, as told by your health care provider: ? Side to side. ? Forward and backward. ? In clockwise and counterclockwise circles. 5. Continue each motion for __________ seconds, or for as long as told by your health care provider. 6. Slowly return to the starting position. Repeat __________ times. Complete this exercise __________ times a day. Shoulder flexion, standing  1. Stand and hold a broomstick, a cane, or a similar object. Place your hands a little more than shoulder width apart on the object. Your left / right hand should be palm up, and your other hand should be palm down. 2. Keep your elbow straight and your shoulder muscles relaxed. Push the stick up with your healthy arm to   raise your left / right arm in front of your body, and then over your head until you feel a stretch in your shoulder (flexion). ? Avoid shrugging your shoulder while you raise your arm. Keep your shoulder blade tucked down toward the middle of your back. 3. Hold for __________ seconds. 4. Slowly return to the starting position. Repeat __________ times. Complete this exercise __________ times a day. Shoulder abduction, standing 1. Stand and hold a broomstick,  a cane, or a similar object. Place your hands a little more than shoulder width apart on the object. Your left / right hand should be palm up, and your other hand should be palm down. 2. Keep your elbow straight and your shoulder muscles relaxed. Push the object across your body toward your left / right side. Raise your left / right arm to the side of your body (abduction) until you feel a stretch in your shoulder. ? Do not raise your arm above shoulder height unless your health care provider tells you to do that. ? If directed, raise your arm over your head. ? Avoid shrugging your shoulder while you raise your arm. Keep your shoulder blade tucked down toward the middle of your back. 3. Hold for __________ seconds. 4. Slowly return to the starting position. Repeat __________ times. Complete this exercise __________ times a day. Internal rotation  1. Place your left / right hand behind your back, palm up. 2. Use your other hand to dangle an exercise band, a towel, or a similar object over your shoulder. Grasp the band with your left / right hand so you are holding on to both ends. 3. Gently pull up on the band until you feel a stretch in the front of your left / right shoulder. The movement of your arm toward the center of your body is called internal rotation. ? Avoid shrugging your shoulder while you raise your arm. Keep your shoulder blade tucked down toward the middle of your back. 4. Hold for __________ seconds. 5. Release the stretch by letting go of the band and lowering your hands. Repeat __________ times. Complete this exercise __________ times a day. Strengthening exercises External rotation  1. Sit in a stable chair without armrests. 2. Secure an exercise band to a stable object at elbow height on your left / right side. 3. Place a soft object, such as a folded towel or a small pillow, between your left / right upper arm and your body to move your elbow about 4 inches (10 cm) away  from your side. 4. Hold the end of the exercise band so it is tight and there is no slack. 5. Keeping your elbow pressed against the soft object, slowly move your forearm out, away from your abdomen (external rotation). Keep your body steady so only your forearm moves. 6. Hold for __________ seconds. 7. Slowly return to the starting position. Repeat __________ times. Complete this exercise __________ times a day. Shoulder abduction  1. Sit in a stable chair without armrests, or stand up. 2. Hold a __________ weight in your left / right hand, or hold an exercise band with both hands. 3. Start with your arms straight down and your left / right palm facing in, toward your body. 4. Slowly lift your left / right hand out to your side (abduction). Do not lift your hand above shoulder height unless your health care provider tells you that this is safe. ? Keep your arms straight. ? Avoid shrugging your shoulder while you   do this movement. Keep your shoulder blade tucked down toward the middle of your back. 5. Hold for __________ seconds. 6. Slowly lower your arm, and return to the starting position. Repeat __________ times. Complete this exercise __________ times a day. Shoulder extension 1. Sit in a stable chair without armrests, or stand up. 2. Secure an exercise band to a stable object in front of you so it is at shoulder height. 3. Hold one end of the exercise band in each hand. Your palms should face each other. 4. Straighten your elbows and lift your hands up to shoulder height. 5. Step back, away from the secured end of the exercise band, until the band is tight and there is no slack. 6. Squeeze your shoulder blades together as you pull your hands down to the sides of your thighs (extension). Stop when your hands are straight down by your sides. Do not let your hands go behind your body. 7. Hold for __________ seconds. 8. Slowly return to the starting position. Repeat __________ times.  Complete this exercise __________ times a day. Shoulder row 1. Sit in a stable chair without armrests, or stand up. 2. Secure an exercise band to a stable object in front of you so it is at waist height. 3. Hold one end of the exercise band in each hand. Position your palms so that your thumbs are facing the ceiling (neutral position). 4. Bend each of your elbows to a 90-degree angle (right angle) and keep your upper arms at your sides. 5. Step back until the band is tight and there is no slack. 6. Slowly pull your elbows back behind you. 7. Hold for __________ seconds. 8. Slowly return to the starting position. Repeat __________ times. Complete this exercise __________ times a day. Shoulder press-ups  1. Sit in a stable chair that has armrests. Sit upright, with your feet flat on the floor. 2. Put your hands on the armrests so your elbows are bent and your fingers are pointing forward. Your hands should be about even with the sides of your body. 3. Push down on the armrests and use your arms to lift yourself off the chair. Straighten your elbows and lift yourself up as much as you comfortably can. ? Move your shoulder blades down, and avoid letting your shoulders move up toward your ears. ? Keep your feet on the ground. As you get stronger, your feet should support less of your body weight as you lift yourself up. 4. Hold for __________ seconds. 5. Slowly lower yourself back into the chair. Repeat __________ times. Complete this exercise __________ times a day. Wall push-ups  1. Stand so you are facing a stable wall. Your feet should be about one arm-length away from the wall. 2. Lean forward and place your palms on the wall at shoulder height. 3. Keep your feet flat on the floor as you bend your elbows and lean forward toward the wall. 4. Hold for __________ seconds. 5. Straighten your elbows to push yourself back to the starting position. Repeat __________ times. Complete this exercise  __________ times a day. This information is not intended to replace advice given to you by your health care provider. Make sure you discuss any questions you have with your health care provider. Document Released: 11/02/2004 Document Revised: 04/12/2018 Document Reviewed: 01/18/2018 [employer] Patient Education  2020 [employer]  

## 2018-08-30 ENCOUNTER — Telehealth: Payer: Self-pay | Admitting: Interventional Cardiology

## 2018-08-30 NOTE — Telephone Encounter (Signed)
Dr. Tamala Julian said let's see the pt after the echo.  Spoke with pt and rescheduled him for 10/04/2018.  Pt in agreement with plan.

## 2018-08-30 NOTE — Telephone Encounter (Signed)
  Patient wants to know if he can have his echo when he come for his appt on 09/04/18 or does he need to put off the appt with Dr Tamala Julian until after he has an echo done.

## 2018-09-02 ENCOUNTER — Other Ambulatory Visit: Payer: Self-pay | Admitting: Sports Medicine

## 2018-09-02 MED FILL — TERBINAFINE HCL 250 MG TAB: 250 | 90 days supply | Qty: 90 | Fill #0

## 2018-09-03 MED FILL — ZOLPIDEM TARTRATE 5 MG TAB: 5 | 30 days supply | Qty: 30 | Fill #2

## 2018-09-04 ENCOUNTER — Telehealth (HOSPITAL_COMMUNITY): Payer: Self-pay | Admitting: *Deleted

## 2018-09-04 ENCOUNTER — Ambulatory Visit: Payer: 59 | Admitting: Interventional Cardiology

## 2018-09-04 MED FILL — PRIMIDONE 50 MG TABLET: 50 | 30 days supply | Qty: 90 | Fill #2

## 2018-09-04 NOTE — Telephone Encounter (Signed)
Spoke with the patient. Completed health history over the phone. Confirmed orientation appointment for tomorrow.Barnet Pall, RN,BSN 09/04/2018 3:48 PM

## 2018-09-05 ENCOUNTER — Encounter (HOSPITAL_COMMUNITY): Payer: Self-pay

## 2018-09-05 ENCOUNTER — Other Ambulatory Visit: Payer: Self-pay

## 2018-09-05 ENCOUNTER — Encounter (HOSPITAL_COMMUNITY)
Admission: RE | Admit: 2018-09-05 | Discharge: 2018-09-05 | Disposition: A | Discharge status: Home / Self Care | Payer: 59 | Source: Ambulatory Visit | Attending: Interventional Cardiology | Admitting: Interventional Cardiology

## 2018-09-05 VITALS — BP 114/70 | HR 82 | Temp 99.0°F | Ht 71.0 in | Wt 326.3 lb

## 2018-09-05 DIAGNOSIS — I214 Non-ST elevation (NSTEMI) myocardial infarction: Secondary | ICD-10-CM | POA: Insufficient documentation

## 2018-09-05 HISTORY — DX: Cardiac arrhythmia, unspecified: I49.9

## 2018-09-05 HISTORY — DX: Atherosclerotic heart disease of native coronary artery without angina pectoris: I25.10

## 2018-09-05 NOTE — Progress Notes (Signed)
Cardiac Individual Treatment Plan  Patient Details  Name: Travis Huff MRN: [id number] Date of Birth: 02/22/1960 Referring Provider:     CARDIAC REHAB PHASE II ORIENTATION from 09/05/2018 in [place]  Referring Provider  Dr. Tamala Julian      Initial Encounter Date:    CARDIAC REHAB PHASE II ORIENTATION from 09/05/2018 in [place]  Date  09/05/18      Visit Diagnosis: NSTEMI (non-ST elevated myocardial infarction) ([place]) 06/20/18 SCAD of LAD  Patient's Home Medications on Admission:  Current Outpatient Medications:  .  apixaban (ELIQUIS) 5 MG TABS tablet, Take 1 tablet (5 mg total) by mouth 2 (two) times daily., Disp: 60 tablet, Rfl: 6 .  atorvastatin (LIPITOR) 80 MG tablet, Take 1 tablet (80 mg total) by mouth daily at 6 PM., Disp: 90 tablet, Rfl: 3 .  calcium carbonate (TUMS - DOSED IN MG ELEMENTAL CALCIUM) 500 MG chewable tablet, Chew 3-4 tablets by mouth 2 (two) times daily as needed (stomach cramps). , Disp: , Rfl:  .  cyclobenzaprine (FLEXERIL) 10 MG tablet, Take 10 mg by mouth 3 (three) times daily as needed for muscle spasms., Disp: , Rfl:  .  docusate sodium (COLACE) 100 MG capsule, Take 1 capsule (100 mg total) by mouth 2 (two) times daily., Disp: 60 capsule, Rfl: 1 .  DULoxetine (CYMBALTA) 60 MG capsule, Take 1 capsule (60 mg total) by mouth at bedtime., Disp: 90 capsule, Rfl: 3 .  lisinopril-hydrochlorothiazide (ZESTORETIC) 20-25 MG tablet, Take 1 tablet by mouth daily., Disp: 90 tablet, Rfl: 3 .  metoprolol succinate (TOPROL-XL) 100 MG 24 hr tablet, Take 1 tablet (100 mg total) by mouth daily. Take with or immediately following a meal., Disp: 90 tablet, Rfl: 3 .  metoprolol succinate (TOPROL-XL) 50 MG 24 hr tablet, Take 1 tablet (50 mg total) by mouth daily. In addition to your 100mg  tablet. Take with or immediately following a meal., Disp: 90 tablet, Rfl: 3 .  nitroGLYCERIN (NITROSTAT) 0.4 MG SL tablet, Place 1  tablet (0.4 mg total) under the tongue every 5 (five) minutes x 3 doses as needed for chest pain. If taking third dose, call 911., Disp: 25 tablet, Rfl: 3 .  oxyCODONE (OXYCONTIN) 20 mg 12 hr tablet, Take 20 mg by mouth 2 (two) times daily as needed., Disp: , Rfl:  .  polyethylene glycol (MIRALAX / GLYCOLAX) 17 g packet, Take 17 g by mouth daily as needed for mild constipation., Disp: , Rfl:  .  terbinafine (LAMISIL) 250 MG tablet, TAKE 1 TABLET (250 MG TOTAL) BY MOUTH DAILY., Disp: 90 tablet, Rfl: 1 .  zolpidem (AMBIEN) 5 MG tablet, Take 1 tablet (5 mg total) by mouth at bedtime as needed for sleep., Disp: 30 tablet, Rfl: 3  Past Medical History: Past Medical History:  Diagnosis Date  . Arrhythmia   . Arthritis   . Asthma    as child  . Avascular necrosis of bone of left hip ([place])   . Back pain   . Blood clot in vein 06/2016   x 2 no bleeding disorders found  . Complication of anesthesia    ketamine hallucinations  . Coronary artery disease   . GERD (gastroesophageal reflux disease)   . Hypertension   . Viral pericarditis 20 yrs ago    Tobacco Use: Social History   Tobacco Use  Smoking Status Former Smoker  . Packs/day: 1.00  . Years: 25.00  . Pack years: 25.00  .  Types: Cigarettes  . Quit date: 2000  . Years since quitting: 20.6  Smokeless Tobacco Current User  . Types: Snuff    Labs: Recent Review Flowsheet Data    Labs for ITP Cardiac and Pulmonary Rehab Latest Ref Rng & Units 12/14/2009 05/23/2012 04/17/2017 06/19/2018   Cholestrol <200 mg/dL - 187 229(H) 219(H)   LDLCALC mg/dL (calc) - 104(H) 140(H) 126(H)   HDL > OR = 40 mg/dL - 68 66 75   Trlycerides <150 mg/dL - 77 113 81   Hemoglobin A1c <5.7 % of total Hgb 5.5 (NOTE)                                                                       According to the ADA Clinical Practice Recommendations for 2011, when HbA1c is used as a screening test:   >=6.5%   Diagnostic of Diabetes Mellitus           (if abnormal  result is confirmed)  5.7-6.4%   Increased risk of developing Diabetes Mellitus  References:Diagnosis and Classification of Diabetes Mellitus,Diabetes [id number] 1):S62-S69 and Standards of Medical Care in         Diabetes - 2011,Diabetes Care,2011,34 (Suppl 1):S11-S61. - 5.9(H) 5.6      Capillary Blood Glucose: No results found for: GLUCAP   Exercise Target Goals: Exercise Program Goal: Individual exercise prescription set using results from initial 6 min walk test and THRR while considering  patient's activity barriers and safety.   Exercise Prescription Goal: Starting with aerobic activity 30 plus minutes a day, 3 days per week for initial exercise prescription. Provide home exercise prescription and guidelines that participant acknowledges understanding prior to discharge.  Activity Barriers & Risk Stratification: Activity Barriers & Cardiac Risk Stratification - 09/05/18 1124      Activity Barriers & Cardiac Risk Stratification   Activity Barriers  Neck/Spine Problems;Arthritis;Back Problems;Joint Problems;Balance Concerns;Left Hip Replacement;Right Hip Replacement;Deconditioning;History of Falls;Muscular Weakness;Assistive Device    Cardiac Risk Stratification  High       6 Minute Walk: 6 Minute Walk    Row Name 09/05/18 1123         6 Minute Walk   Phase  Initial     Distance  856 feet     Walk Time  6 minutes     # of Rest Breaks  0     MPH  1.6     METS  1.8     RPE  12     Perceived Dyspnea   0     VO2 Peak  6.6     Symptoms  No     Resting HR  82 bpm     Resting BP  114/70     Resting Oxygen Saturation   96 %     Exercise Oxygen Saturation  during 6 min walk  97 %     Max Ex. HR  109 bpm     Max Ex. BP  140/82     2 Minute Post BP  124/72        Oxygen Initial Assessment:   Oxygen Re-Evaluation:   Oxygen Discharge (Final Oxygen Re-Evaluation):   Initial Exercise Prescription: Initial Exercise Prescription - 09/05/18 1100      Date  of  Initial Exercise RX and Referring Provider   Date  09/05/18    Referring Provider  Dr. Tamala Julian    Expected Discharge Date  10/18/18      NuStep   Level  2    SPM  75    Minutes  30    METs  1.6      Prescription Details   Frequency (times per week)  2    Duration  Progress to 30 minutes of continuous aerobic without signs/symptoms of physical distress      Intensity   THRR 40-80% of Max Heartrate  65-131    Ratings of Perceived Exertion  11-13      Progression   Progression  Continue to progress workloads to maintain intensity without signs/symptoms of physical distress.      Resistance Training   Training Prescription  Yes    Weight  3 lbs.     Reps  10-15       Perform Capillary Blood Glucose checks as needed.  Exercise Prescription Changes:   Exercise Comments:   Exercise Goals and Review: Exercise Goals    Row Name 09/05/18 1127             Exercise Goals   Increase Physical Activity  Yes       Intervention  Provide advice, education, support and counseling about physical activity/exercise needs.;Develop an individualized exercise prescription for aerobic and resistive training based on initial evaluation findings, risk stratification, comorbidities and participant's personal goals.       Expected Outcomes  Short Term: Attend rehab on a regular basis to increase amount of physical activity.;Long Term: Add in home exercise to make exercise part of routine and to increase amount of physical activity.;Long Term: Exercising regularly at least 3-5 days a week.       Increase Strength and Stamina  Yes       Intervention  Provide advice, education, support and counseling about physical activity/exercise needs.;Develop an individualized exercise prescription for aerobic and resistive training based on initial evaluation findings, risk stratification, comorbidities and participant's personal goals.       Expected Outcomes  Short Term: Increase workloads from initial  exercise prescription for resistance, speed, and METs.;Short Term: Perform resistance training exercises routinely during rehab and add in resistance training at home;Long Term: Improve cardiorespiratory fitness, muscular endurance and strength as measured by increased METs and functional capacity (6MWT)       Able to understand and use rate of perceived exertion (RPE) scale  Yes       Intervention  Provide education and explanation on how to use RPE scale       Expected Outcomes  Short Term: Able to use RPE daily in rehab to express subjective intensity level;Long Term:  Able to use RPE to guide intensity level when exercising independently       Knowledge and understanding of Target Heart Rate Range (THRR)  Yes       Intervention  Provide education and explanation of THRR including how the numbers were predicted and where they are located for reference       Expected Outcomes  Short Term: Able to state/look up THRR;Long Term: Able to use THRR to govern intensity when exercising independently;Short Term: Able to use daily as guideline for intensity in rehab       Able to check pulse independently  Yes       Intervention  Provide education and demonstration on how to check pulse  in carotid and radial arteries.;Review the importance of being able to check your own pulse for safety during independent exercise       Expected Outcomes  Short Term: Able to explain why pulse checking is important during independent exercise;Long Term: Able to check pulse independently and accurately       Understanding of Exercise Prescription  Yes       Intervention  Provide education, explanation, and written materials on patient's individual exercise prescription       Expected Outcomes  Short Term: Able to explain program exercise prescription;Long Term: Able to explain home exercise prescription to exercise independently          Exercise Goals Re-Evaluation :    Discharge Exercise Prescription (Final Exercise  Prescription Changes):   Nutrition:  Target Goals: Understanding of nutrition guidelines, daily intake of sodium 1500mg , cholesterol 200mg , calories 30% from fat and 7% or less from saturated fats, daily to have 5 or more servings of fruits and vegetables.  Biometrics: Pre Biometrics - 09/05/18 1127      Pre Biometrics   Height  5\' 11"  (1.803 m)    Weight  (!) 148 kg    BMI (Calculated)  45.53    Triceps Skinfold  32 mm    % Body Fat  42.3 %    Grip Strength  40 kg    Flexibility  0 in    Single Leg Stand  0 seconds        Nutrition Therapy Plan and Nutrition Goals:   Nutrition Assessments:   Nutrition Goals Re-Evaluation:   Nutrition Goals Discharge (Final Nutrition Goals Re-Evaluation):   Psychosocial: Target Goals: Acknowledge presence or absence of significant depression and/or stress, maximize coping skills, provide positive support system. Participant is able to verbalize types and ability to use techniques and skills needed for reducing stress and depression.  Initial Review & Psychosocial Screening: Initial Psych Review & Screening - 09/05/18 1215      Initial Review   Current issues with  Current Anxiety/Panic;Current Stress Concerns      Family Dynamics   Good Support System?  Yes   Daran has his wife for support     Barriers   Psychosocial barriers to participate in program  The patient should benefit from training in stress management and relaxation.;Psychosocial barriers identified (see note)      Screening Interventions   Interventions  Encouraged to exercise;Provide feedback about the scores to participant    Expected Outcomes  Long Term Goal: Stressors or current issues are controlled or eliminated.;Short Term goal: Identification and review with participant of any Quality of Life or Depression concerns found by scoring the questionnaire.;Long Term goal: The participant improves quality of Life and PHQ9 Scores as seen by post scores and/or  verbalization of changes       Quality of Life Scores: Quality of Life - 09/05/18 1133      Quality of Life   Select  Quality of Life      Quality of Life Scores   Health/Function Pre  17.03 %    Socioeconomic Pre  24.93 %    Psych/Spiritual Pre  19.93 %    Family Pre  30 %    GLOBAL Pre  21.16 %      Scores of 19 and below usually indicate a poorer quality of life in these areas.  A difference of  2-3 points is a clinically meaningful difference.  A difference of 2-3 points in the total  score of the Quality of Life Index has been associated with significant improvement in overall quality of life, self-image, physical symptoms, and general health in studies assessing change in quality of life.  PHQ-9: Recent Review Flowsheet Data    Depression screen [facility] 2/9 09/05/2018 06/15/2017 05/11/2016   Decreased Interest 0 1 0   Down, Depressed, Hopeless 0 0 0   PHQ - 2 Score 0 1 0   Altered sleeping - 0 2   Tired, decreased energy - 1 3   Change in appetite - 0 0   Feeling bad or failure about yourself  - 0 0   Trouble concentrating - 0 0   Moving slowly or fidgety/restless - 0 3   Suicidal thoughts - 0 0   PHQ-9 Score - 2 8   Difficult doing work/chores - Somewhat difficult -     Interpretation of Total Score  Total Score Depression Severity:  1-4 = Minimal depression, 5-9 = Mild depression, 10-14 = Moderate depression, 15-19 = Moderately severe depression, 20-27 = Severe depression   Psychosocial Evaluation and Intervention:   Psychosocial Re-Evaluation:   Psychosocial Discharge (Final Psychosocial Re-Evaluation):   Vocational Rehabilitation: Provide vocational rehab assistance to qualifying candidates.   Vocational Rehab Evaluation & Intervention: Vocational Rehab - 09/05/18 1217      Initial Vocational Rehab Evaluation & Intervention   Assessment shows need for Vocational Rehabilitation  No       Education: Education Goals: Education classes will be provided on a  weekly basis, covering required topics. Participant will state understanding/return demonstration of topics presented.  Learning Barriers/Preferences: Learning Barriers/Preferences - 09/05/18 1128      Learning Barriers/Preferences   Learning Barriers  Exercise Concerns    Learning Preferences  None       Education Topics: Hypertension, Hypertension Reduction -Define heart disease and high blood pressure. Discus how high blood pressure affects the body and ways to reduce high blood pressure.   Exercise and Your Heart -Discuss why it is important to exercise, the FITT principles of exercise, normal and abnormal responses to exercise, and how to exercise safely.   Angina -Discuss definition of angina, causes of angina, treatment of angina, and how to decrease risk of having angina.   Cardiac Medications -Review what the following cardiac medications are used for, how they affect the body, and side effects that may occur when taking the medications.  Medications include Aspirin, Beta blockers, calcium channel blockers, ACE Inhibitors, angiotensin receptor blockers, diuretics, digoxin, and antihyperlipidemics.   Congestive Heart Failure -Discuss the definition of CHF, how to live with CHF, the signs and symptoms of CHF, and how keep track of weight and sodium intake.   Heart Disease and Intimacy -Discus the effect sexual activity has on the heart, how changes occur during intimacy as we age, and safety during sexual activity.   Smoking Cessation / COPD -Discuss different methods to quit smoking, the health benefits of quitting smoking, and the definition of COPD.   Nutrition I: Fats -Discuss the types of cholesterol, what cholesterol does to the heart, and how cholesterol levels can be controlled.   Nutrition II: Labels -Discuss the different components of food labels and how to read food label   Heart Parts/Heart Disease and PAD -Discuss the anatomy of the heart, the  pathway of blood circulation through the heart, and these are affected by heart disease.   Stress I: Signs and Symptoms -Discuss the causes of stress, how stress may lead to anxiety and  depression, and ways to limit stress.   Stress II: Relaxation -Discuss different types of relaxation techniques to limit stress.   Warning Signs of Stroke / TIA -Discuss definition of a stroke, what the signs and symptoms are of a stroke, and how to identify when someone is having stroke.   Knowledge Questionnaire Score: Knowledge Questionnaire Score - 09/05/18 1148      Knowledge Questionnaire Score   Pre Score  22/24       Core Components/Risk Factors/Patient Goals at Admission: Personal Goals and Risk Factors at Admission - 09/05/18 1128      Core Components/Risk Factors/Patient Goals on Admission    Weight Management  Yes;Obesity;Weight Maintenance;Weight Loss    Intervention  Weight Management: Develop a combined nutrition and exercise program designed to reach desired caloric intake, while maintaining appropriate intake of nutrient and fiber, sodium and fats, and appropriate energy expenditure required for the weight goal.;Weight Management: Provide education and appropriate resources to help participant work on and attain dietary goals.;Weight Management/Obesity: Establish reasonable short term and long term weight goals.;Obesity: Provide education and appropriate resources to help participant work on and attain dietary goals.    Admit Weight  326 lb 4.5 oz (148 kg)    Goal Weight: Long Term  246 lb (111.6 kg)    Expected Outcomes  Short Term: Continue to assess and modify interventions until short term weight is achieved;Long Term: Adherence to nutrition and physical activity/exercise program aimed toward attainment of established weight goal;Weight Maintenance: Understanding of the daily nutrition guidelines, which includes 25-35% calories from fat, 7% or less cal from saturated fats, less than  200mg  cholesterol, less than 1.5gm of sodium, & 5 or more servings of fruits and vegetables daily;Weight Loss: Understanding of general recommendations for a balanced deficit meal plan, which promotes 1-2 lb weight loss per week and includes a negative energy balance of [phone] kcal/d;Understanding recommendations for meals to include 15-35% energy as protein, 25-35% energy from fat, 35-60% energy from carbohydrates, less than 200mg  of dietary cholesterol, 20-35 gm of total fiber daily;Understanding of distribution of calorie intake throughout the day with the consumption of 4-5 meals/snacks    Tobacco Cessation  Yes    Expected Outcomes  Short Term: Will demonstrate readiness to quit, by selecting a quit date.;Long Term: Complete abstinence from all tobacco products for at least 12 months from quit date.;Short Term: Will quit all tobacco product use, adhering to prevention of relapse plan.    Hypertension  Yes    Intervention  Provide education on lifestyle modifcations including regular physical activity/exercise, weight management, moderate sodium restriction and increased consumption of fresh fruit, vegetables, and low fat dairy, alcohol moderation, and smoking cessation.;Monitor prescription use compliance.    Expected Outcomes  Short Term: Continued assessment and intervention until BP is < 140/76mm HG in hypertensive participants. < 130/72mm HG in hypertensive participants with diabetes, heart failure or chronic kidney disease.;Long Term: Maintenance of blood pressure at goal levels.       Core Components/Risk Factors/Patient Goals Review:    Core Components/Risk Factors/Patient Goals at Discharge (Final Review):    ITP Comments: ITP Comments    Row Name 09/05/18 1156           ITP Comments  Dr Fransico Him MD, Medical Director          Comments: Nicki Reaper attended orientation on 09/05/2018 to review rules and guidelines for program.  Completed 6 minute walk test, Intitial ITP, and  exercise prescription.  VSS. Telemetry-Sinus Rhythm  downward QRS.  Asymptomatic. Safety measures and social distancing in place per CDC guidelines.Patient used a rollator for his walk test. Sulieman will attend two days a week rather then 3. Patient is somewhat deconditioned.Will continue to monitor the patient throughout  the program.Pistol Kessenich Venetia Maxon, RN,BSN 09/05/2018 12:25 PM

## 2018-09-10 ENCOUNTER — Telehealth (HOSPITAL_COMMUNITY): Payer: Self-pay

## 2018-09-10 ENCOUNTER — Encounter: Payer: Self-pay | Admitting: Podiatry

## 2018-09-10 ENCOUNTER — Ambulatory Visit: Payer: 59

## 2018-09-10 ENCOUNTER — Encounter: Payer: 59 | Admitting: Podiatry

## 2018-09-10 DIAGNOSIS — G5793 Unspecified mononeuropathy of bilateral lower limbs: Secondary | ICD-10-CM

## 2018-09-10 NOTE — Progress Notes (Signed)
This encounter was created in error - please disregard.

## 2018-09-10 NOTE — Telephone Encounter (Signed)
Successful telephone encounter to Mr. Travis Huff in response to left message stating he recently sustained a fall at home and would not be able to start Cardiac Rehab this week. Mr. Levier reports "hip popping out of socket" x 3 since hip replacement in late March 2020. Most recent this weekend as he was bending over in his driveway. Denies major injuries but admits to scrapes, bruises, and mild pain in his hips and feet. He has a scheduled appointment with his orthopedist 09/17/2018 and plans to report to his first day of Cardiac Rehab exercise Wednesday 09/18/2018 if cleared for exercise. Velita Quirk E. Rollene Rotunda, RN BSN

## 2018-09-11 ENCOUNTER — Ambulatory Visit (HOSPITAL_COMMUNITY): Payer: 59

## 2018-09-11 ENCOUNTER — Telehealth (HOSPITAL_COMMUNITY): Payer: Self-pay | Admitting: Sports Medicine

## 2018-09-12 ENCOUNTER — Telehealth: Payer: Self-pay | Admitting: *Deleted

## 2018-09-12 NOTE — Telephone Encounter (Addendum)
Per Dr Tamala Julian: a sleep study is ordered given his body phenotype and excessive daytime sleepiness.  Split night sent to sleep pool.

## 2018-09-13 ENCOUNTER — Telehealth: Payer: Self-pay | Admitting: *Deleted

## 2018-09-13 ENCOUNTER — Ambulatory Visit (HOSPITAL_COMMUNITY): Payer: 59

## 2018-09-13 NOTE — Telephone Encounter (Signed)
Staff message sent to Travis Huff ok to schedule sleep study. 

## 2018-09-13 NOTE — Telephone Encounter (Signed)
-----   Message from Freada Bergeron, [place] sent at 09/12/2018  8:52 PM EDT ----- Regarding: PRECERT Split night for excessive daytime sleepiness.

## 2018-09-16 ENCOUNTER — Ambulatory Visit (INDEPENDENT_AMBULATORY_CARE_PROVIDER_SITE_OTHER): Payer: 59 | Admitting: Sports Medicine

## 2018-09-16 ENCOUNTER — Other Ambulatory Visit: Payer: Self-pay

## 2018-09-16 ENCOUNTER — Ambulatory Visit (INDEPENDENT_AMBULATORY_CARE_PROVIDER_SITE_OTHER): Payer: 59

## 2018-09-16 ENCOUNTER — Ambulatory Visit (HOSPITAL_COMMUNITY): Payer: 59

## 2018-09-16 ENCOUNTER — Encounter: Payer: Self-pay | Admitting: Sports Medicine

## 2018-09-16 DIAGNOSIS — R059 Cough, unspecified: Secondary | ICD-10-CM | POA: Insufficient documentation

## 2018-09-16 DIAGNOSIS — R05 Cough: Secondary | ICD-10-CM | POA: Diagnosis not present

## 2018-09-16 MED ORDER — ALBUTEROL SULFATE HFA 108 (90 BASE) MCG/ACT IN AERS: 2.0000 | INHALATION_SPRAY | Freq: Four times a day (QID) | RESPIRATORY_TRACT | 3 refills | Status: DC | PRN

## 2018-09-16 MED ORDER — AZITHROMYCIN 250 MG PO TABS: ORAL_TABLET | ORAL | 0 refills | Status: DC

## 2018-09-16 NOTE — Assessment & Plan Note (Signed)
Productive cough, no fevers, chills, muscle aches, body aches. No anosmia, no ageusia. Travis Huff's wife is a Marine scientist, he does have some significant rhonchi, inspiratory, expiratory wheezes, adding azithromycin for presumed community-acquired pneumonia, as albuterol. Chest x-ray today. Weight is down 3 pounds so I doubt this is a CHF exacerbation.   There was a bit of orthopnea. We can revisit this in 1 to 2 weeks.

## 2018-09-16 NOTE — Progress Notes (Signed)
Virtual Visit via WebEx/MyChart   I connected with  Travis Huff  on 09/16/18 via WebEx/MyChart/Doximity Video and verified that I am speaking with the correct person using two identifiers.   I discussed the limitations, risks, security and privacy concerns of performing an evaluation and management service by WebEx/MyChart/Doximity Video, including the higher likelihood of inaccurate diagnosis and treatment, and the availability of in person appointments.  We also discussed the likely need of an additional face to face encounter for complete and high quality delivery of care.  I also discussed with the patient that there may be a patient responsible charge related to this service. The patient expressed understanding and wishes to proceed.  Provider location is either at home or medical facility. Patient location is at their home, different from provider location. People involved in care of the patient during this telehealth encounter were myself, my nurse/medical assistant, and my front office/scheduling team member.  Subjective:    CC: Coughing  HPI: For the past several days this pleasant 58 year old male has developed increasing cough, no shortness of breath, no wheeze, he does have worsening of cough when laying flat at night.  No fevers, chills, no GI symptoms, no skin rash, no anosmia, no ageusia.  No muscle aches, body aches.  Symptoms are moderate, persistent.  His wife is a Marine scientist, she listened to his lungs and told me that she did note some rhonchi and expiratory, expiratory wheezes.  I reviewed the past medical history, family history, social history, surgical history, and allergies today and no changes were needed.  Please see the problem list section below in epic for further details.  Past Medical History: Past Medical History:  Diagnosis Date  . Arrhythmia   . Arthritis   . Asthma    as child  . Avascular necrosis of bone of left hip ([place])   . Back pain   . Blood  clot in vein 06/2016   x 2 no bleeding disorders found  . Complication of anesthesia    ketamine hallucinations  . Coronary artery disease   . GERD (gastroesophageal reflux disease)   . Hypertension   . Viral pericarditis 20 yrs ago   Past Surgical History: Past Surgical History:  Procedure Laterality Date  . ABDOMINAL EXPOSURE N/A 06/23/2016   Procedure: ABDOMINAL EXPOSURE;  Surgeon: Rosetta Posner, MD;  Location: [facility];  Service: Vascular;  Laterality: N/A;  . ANTERIOR LATERAL LUMBAR FUSION 4 LEVELS Right 06/23/2016   Procedure: Right  Lumbar two-three Lumbar three-four Lumbar four-five Anterolateral lumbar interbody fusion;  Surgeon: Erline Levine, MD;  Location: [facility];  Service: Neurosurgery;  Laterality: Right;  . ANTERIOR LUMBAR FUSION N/A 06/23/2016   Procedure: Lumbar five -sacral one  Anterior lumbar interbody fusion with Dr. Sherren Mocha Early for approach;  Surgeon: Erline Levine, MD;  Location: [facility];  Service: Neurosurgery;  Laterality: N/A;  . APPLICATION OF INTRAOPERATIVE CT SCAN N/A 06/26/2016   Procedure: APPLICATION OF INTRAOPERATIVE CT SCAN;  Surgeon: Erline Levine, MD;  Location: [facility];  Service: Neurosurgery;  Laterality: N/A;  . CARDIAC CATHETERIZATION    . CARDIOVASCULAR STRESS TEST     Negative in May 2014  . COLONOSCOPY    . HERNIA REPAIR     umbilical  . IR IVC FILTER PLMT / S&I [facility] Keels GUID/MOD SED  03/26/2018  . JOINT REPLACEMENT    . LEFT HEART CATH AND CORONARY ANGIOGRAPHY N/A 06/21/2018   Procedure: LEFT HEART CATH AND CORONARY ANGIOGRAPHY;  Surgeon: Irish Lack,  Charlann Lange, MD;  Location: [facility];  Service: Cardiovascular;  Laterality: N/A;  . POSTERIOR LUMBAR FUSION 4 LEVEL N/A 06/26/2016   Procedure: Thoracic ten to Pelvis fixation with AIRO;  Surgeon: Erline Levine, MD;  Location: [facility];  Service: Neurosurgery;  Laterality: N/A;  . TENDON REPAIR     right hand  . TOTAL HIP ARTHROPLASTY Right 05/25/2014   Procedure: RIGHT TOTAL HIP ARTHROPLASTY ANTERIOR  APPROACH;  Surgeon: Rod Can, MD;  Location: [facility];  Service: Orthopedics;  Laterality: Right;  . TOTAL HIP ARTHROPLASTY Left 03/27/2018   Procedure: TOTAL HIP ARTHROPLASTY ANTERIOR APPROACH- DA complex;  Surgeon: Rod Can, MD;  Location: [facility];  Service: Orthopedics;  Laterality: Left;  . TOTAL KNEE ARTHROPLASTY Bilateral    Social History: Social History   Socioeconomic History  . Marital status: Married    Spouse name: [name]  . Number of children: 2  . Years of education: 68  . Highest education level: Not on file  Occupational History  . Occupation: Public relations account executive  Social Needs  . Financial resource strain: Not on file  . Food insecurity    Worry: Never true    Inability: Never true  . Transportation needs    Medical: No    Non-medical: No  Tobacco Use  . Smoking status: Former Smoker    Packs/day: 1.00    Years: 25.00    Pack years: 25.00    Types: Cigarettes    Quit date: 2000    Years since quitting: 20.7  . Smokeless tobacco: Current User    Types: Snuff  Substance and Sexual Activity  . Alcohol use: Yes    Alcohol/week: 14.0 standard drinks    Types: 14 Cans of beer per week    Comment: 2 beers QD  . Drug use: No  . Sexual activity: Not on file  Lifestyle  . Physical activity    Days per week: 0 days    Minutes per session: 0 min  . Stress: To some extent  Relationships  . Social Herbalist on phone: Not on file    Gets together: Not on file    Attends religious service: Not on file    Active member of club or organization: Not on file    Attends meetings of clubs or organizations: Not on file    Relationship status: Not on file  Other Topics Concern  . Not on file  Social History Narrative   Lives with wife in a one story home.  Has 2 children.  Works as a Public relations account executive.  Education: high school.    Family History: Family History  Adopted: Yes  Problem Relation Age of Onset  . Healthy Daughter   . Healthy Daughter     Allergies: Allergies  Allergen Reactions  . Ketamine     hallucinations  . Morphine And Related Itching   Medications: See med rec.  Review of Systems: No fevers, chills, night sweats, weight loss, chest pain, or shortness of breath.   Objective:    General: Speaking full sentences, no audible heavy breathing.  Sounds alert and appropriately interactive.  Appears well.  Face symmetric.  Extraocular movements intact.  Pupils equal and round.  No nasal flaring or accessory muscle use visualized.  No other physical exam performed due to the non-physical nature of this visit.  Impression and Recommendations:    Cough Productive cough, no fevers, chills, muscle aches, body aches. No anosmia, no ageusia. Lorraine's  wife is a Marine scientist, he does have some significant rhonchi, inspiratory, expiratory wheezes, adding azithromycin for presumed community-acquired pneumonia, as albuterol. Chest x-ray today. Weight is down 3 pounds so I doubt this is a CHF exacerbation.   There was a bit of orthopnea. We can revisit this in 1 to 2 weeks.  I discussed the above assessment and treatment plan with the patient. The patient was provided an opportunity to ask questions and all were answered. The patient agreed with the plan and demonstrated an understanding of the instructions.   The patient was advised to call back or seek an in-person evaluation if the symptoms worsen or if the condition fails to improve as anticipated.   I provided 25 minutes of non-face-to-face time during this encounter, 15 minutes of additional time was needed to gather information, review chart, records, communicate/coordinate with staff remotely, troubleshooting the multiple errors that we get every time when trying to do video calls through the electronic medical record, WebEx, and Doximity, restart the encounter multiple times due to instability of the software, as well as complete documentation.    ___________________________________________ Gwen Her. Dianah Field, M.D., ABFM., CAQSM. Primary Care and Sports Medicine [facility]  Adjunct Professor of [place] of [employer]

## 2018-09-17 ENCOUNTER — Telehealth (HOSPITAL_COMMUNITY): Payer: Self-pay

## 2018-09-17 ENCOUNTER — Encounter: Payer: Self-pay | Admitting: Sports Medicine

## 2018-09-17 ENCOUNTER — Telehealth (HOSPITAL_COMMUNITY): Payer: Self-pay | Admitting: Sports Medicine

## 2018-09-17 NOTE — Telephone Encounter (Signed)
Unsuccessful telephone encounter to Mr. Travis Huff to follow up on documentation of PCP visit yesterday for dx cough and antibiotic prescription for possible pneumonia. Hipaa compliant VM message left requesting call back at [phone]. Amelianna Meller E. Rollene Rotunda, RN, BSN.

## 2018-09-17 NOTE — Telephone Encounter (Signed)
Scheduled patient for his sleep study on 09/27/18 and his covid test on 9/22 at 2:55 p but patient has declined his sleep study stating he has pneumonia in his lower right lung and he will call back when he is better.

## 2018-09-18 ENCOUNTER — Encounter: Payer: Self-pay | Admitting: Sports Medicine

## 2018-09-18 ENCOUNTER — Ambulatory Visit (HOSPITAL_COMMUNITY): Payer: 59

## 2018-09-18 ENCOUNTER — Telehealth (HOSPITAL_COMMUNITY): Payer: Self-pay

## 2018-09-18 NOTE — Telephone Encounter (Signed)
Late Entry for 09/17/2018 at 1630: Successful telephone encounter to Mr. Travis Huff in response to request from pt wife to return call. Per wife's message, patient would not be participating in CR this week secondary to not feeling well and a likely diagnosis of pneumonia. Patient confirmed during this encounter that he has been notified by PCP that CXR completed 09/16/2018 indicated pneumonia. Patient continues to have productive cough. Being treated with Zpack. Per patient, PCP states Covid 19 test not indicated secondary to patient being afebrile. Patient is educated on s/s of Covid 68 and encouraged to discuss Covid 31 testing with PCP if symptoms worsen or other symptoms develop. Patient will be placed on hold from CR for two weeks. CR Nursing will re-eval at that time to see if patient is appropriate to begin exercise as he also missed his orthopedic appointment today which is needed for activity clearance. Waynette Towers E. Rollene Rotunda, RN, BSN

## 2018-09-19 NOTE — Progress Notes (Signed)
Cardiac Individual Treatment Plan  Patient Details  Name: Travis Huff MRN: [id number] Date of Birth: May 25, 1960 Referring Provider:     CARDIAC REHAB PHASE II ORIENTATION from 09/05/2018 in [place]  Referring Provider  Dr. Tamala Julian      Initial Encounter Date:    CARDIAC REHAB PHASE II ORIENTATION from 09/05/2018 in [place]  Date  09/05/18      Visit Diagnosis: NSTEMI (non-ST elevated myocardial infarction) ([place]) 06/20/18 SCAD of LAD  Patient's Home Medications on Admission:  Current Outpatient Medications:  .  apixaban (ELIQUIS) 5 MG TABS tablet, Take 1 tablet (5 mg total) by mouth 2 (two) times daily., Disp: 60 tablet, Rfl: 6 .  atorvastatin (LIPITOR) 80 MG tablet, Take 1 tablet (80 mg total) by mouth daily at 6 PM., Disp: 90 tablet, Rfl: 3 .  calcium carbonate (TUMS - DOSED IN MG ELEMENTAL CALCIUM) 500 MG chewable tablet, Chew 3-4 tablets by mouth 2 (two) times daily as needed (stomach cramps). , Disp: , Rfl:  .  cyclobenzaprine (FLEXERIL) 10 MG tablet, Take 10 mg by mouth 3 (three) times daily as needed for muscle spasms., Disp: , Rfl:  .  docusate sodium (COLACE) 100 MG capsule, Take 1 capsule (100 mg total) by mouth 2 (two) times daily., Disp: 60 capsule, Rfl: 1 .  DULoxetine (CYMBALTA) 60 MG capsule, Take 1 capsule (60 mg total) by mouth at bedtime., Disp: 90 capsule, Rfl: 3 .  lisinopril-hydrochlorothiazide (ZESTORETIC) 20-25 MG tablet, Take 1 tablet by mouth daily., Disp: 90 tablet, Rfl: 3 .  metoprolol succinate (TOPROL-XL) 100 MG 24 hr tablet, Take 1 tablet (100 mg total) by mouth daily. Take with or immediately following a meal., Disp: 90 tablet, Rfl: 3 .  metoprolol succinate (TOPROL-XL) 50 MG 24 hr tablet, Take 1 tablet (50 mg total) by mouth daily. In addition to your 100mg  tablet. Take with or immediately following a meal., Disp: 90 tablet, Rfl: 3 .  nitroGLYCERIN (NITROSTAT) 0.4 MG SL tablet, Place 1  tablet (0.4 mg total) under the tongue every 5 (five) minutes x 3 doses as needed for chest pain. If taking third dose, call 911., Disp: 25 tablet, Rfl: 3 .  oxyCODONE (OXYCONTIN) 20 mg 12 hr tablet, Take 20 mg by mouth 2 (two) times daily as needed., Disp: , Rfl:  .  polyethylene glycol (MIRALAX / GLYCOLAX) 17 g packet, Take 17 g by mouth daily as needed for mild constipation., Disp: , Rfl:  .  terbinafine (LAMISIL) 250 MG tablet, TAKE 1 TABLET (250 MG TOTAL) BY MOUTH DAILY., Disp: 90 tablet, Rfl: 1 .  zolpidem (AMBIEN) 5 MG tablet, Take 1 tablet (5 mg total) by mouth at bedtime as needed for sleep., Disp: 30 tablet, Rfl: 3 .  albuterol (VENTOLIN HFA) 108 (90 Base) MCG/ACT inhaler, Inhale 2 puffs into the lungs every 6 (six) hours as needed for wheezing., Disp: 18 g, Rfl: 3 .  azithromycin (ZITHROMAX Z-PAK) 250 MG tablet, Take 2 tablets (500 mg) on  Day 1,  followed by 1 tablet (250 mg) once daily on Days 2 through 5., Disp: 6 tablet, Rfl: 0  Past Medical History: Past Medical History:  Diagnosis Date  . Arrhythmia   . Arthritis   . Asthma    as child  . Avascular necrosis of bone of left hip ([place])   . Back pain   . Blood clot in vein 06/2016   x 2 no bleeding disorders found  .  Complication of anesthesia    ketamine hallucinations  . Coronary artery disease   . GERD (gastroesophageal reflux disease)   . Hypertension   . Viral pericarditis 20 yrs ago    Tobacco Use: Social History   Tobacco Use  Smoking Status Former Smoker  . Packs/day: 1.00  . Years: 25.00  . Pack years: 25.00  . Types: Cigarettes  . Quit date: 2000  . Years since quitting: 20.7  Smokeless Tobacco Current User  . Types: Snuff    Labs: Recent Review Flowsheet Data    Labs for ITP Cardiac and Pulmonary Rehab Latest Ref Rng & Units 12/14/2009 05/23/2012 04/17/2017 06/19/2018   Cholestrol <200 mg/dL - 187 229(H) 219(H)   LDLCALC mg/dL (calc) - 104(H) 140(H) 126(H)   HDL > OR = 40 mg/dL - 68 66 75    Trlycerides <150 mg/dL - 77 113 81   Hemoglobin A1c <5.7 % of total Hgb 5.5 (NOTE)                                                                       According to the ADA Clinical Practice Recommendations for 2011, when HbA1c is used as a screening test:   >=6.5%   Diagnostic of Diabetes Mellitus           (if abnormal result is confirmed)  5.7-6.4%   Increased risk of developing Diabetes Mellitus  References:Diagnosis and Classification of Diabetes Mellitus,Diabetes [id number] 1):S62-S69 and Standards of Medical Care in         Diabetes - 2011,Diabetes Care,2011,34 (Suppl 1):S11-S61. - 5.9(H) 5.6      Capillary Blood Glucose: No results found for: GLUCAP   Exercise Target Goals: Exercise Program Goal: Individual exercise prescription set using results from initial 6 min walk test and THRR while considering  patient's activity barriers and safety.   Exercise Prescription Goal: Initial exercise prescription builds to 30-45 minutes a day of aerobic activity, 2-3 days per week.  Home exercise guidelines will be given to patient during program as part of exercise prescription that the participant will acknowledge.  Activity Barriers & Risk Stratification: Activity Barriers & Cardiac Risk Stratification - 09/05/18 1124      Activity Barriers & Cardiac Risk Stratification   Activity Barriers  Neck/Spine Problems;Arthritis;Back Problems;Joint Problems;Balance Concerns;Left Hip Replacement;Right Hip Replacement;Deconditioning;History of Falls;Muscular Weakness;Assistive Device    Cardiac Risk Stratification  High       6 Minute Walk: 6 Minute Walk    Row Name 09/05/18 1123         6 Minute Walk   Phase  Initial     Distance  856 feet     Walk Time  6 minutes     # of Rest Breaks  0     MPH  1.6     METS  1.8     RPE  12     Perceived Dyspnea   0     VO2 Peak  6.6     Symptoms  No     Resting HR  82 bpm     Resting BP  114/70     Resting Oxygen Saturation   96 %      Exercise Oxygen Saturation  during 6 min  walk  97 %     Max Ex. HR  109 bpm     Max Ex. BP  140/82     2 Minute Post BP  124/72        Oxygen Initial Assessment:   Oxygen Re-Evaluation:   Oxygen Discharge (Final Oxygen Re-Evaluation):   Initial Exercise Prescription: Initial Exercise Prescription - 09/05/18 1100      Date of Initial Exercise RX and Referring Provider   Date  09/05/18    Referring Provider  Dr. Tamala Julian    Expected Discharge Date  10/18/18      NuStep   Level  2    SPM  75    Minutes  30    METs  1.6      Prescription Details   Frequency (times per week)  2    Duration  Progress to 30 minutes of continuous aerobic without signs/symptoms of physical distress      Intensity   THRR 40-80% of Max Heartrate  65-131    Ratings of Perceived Exertion  11-13      Progression   Progression  Continue to progress workloads to maintain intensity without signs/symptoms of physical distress.      Resistance Training   Training Prescription  Yes    Weight  3 lbs.     Reps  10-15       Perform Capillary Blood Glucose checks as needed.  Exercise Prescription Changes:   Exercise Comments: Exercise Comments    Row Name 09/18/18 1638           Exercise Comments  Unable to review METs and goals. Pt out with pneumonia.          Exercise Goals and Review: Exercise Goals    Row Name 09/05/18 1127             Exercise Goals   Increase Physical Activity  Yes       Intervention  Provide advice, education, support and counseling about physical activity/exercise needs.;Develop an individualized exercise prescription for aerobic and resistive training based on initial evaluation findings, risk stratification, comorbidities and participant's personal goals.       Expected Outcomes  Short Term: Attend rehab on a regular basis to increase amount of physical activity.;Long Term: Add in home exercise to make exercise part of routine and to increase amount of  physical activity.;Long Term: Exercising regularly at least 3-5 days a week.       Increase Strength and Stamina  Yes       Intervention  Provide advice, education, support and counseling about physical activity/exercise needs.;Develop an individualized exercise prescription for aerobic and resistive training based on initial evaluation findings, risk stratification, comorbidities and participant's personal goals.       Expected Outcomes  Short Term: Increase workloads from initial exercise prescription for resistance, speed, and METs.;Short Term: Perform resistance training exercises routinely during rehab and add in resistance training at home;Long Term: Improve cardiorespiratory fitness, muscular endurance and strength as measured by increased METs and functional capacity (6MWT)       Able to understand and use rate of perceived exertion (RPE) scale  Yes       Intervention  Provide education and explanation on how to use RPE scale       Expected Outcomes  Short Term: Able to use RPE daily in rehab to express subjective intensity level;Long Term:  Able to use RPE to guide intensity level when exercising independently  Knowledge and understanding of Target Heart Rate Range (THRR)  Yes       Intervention  Provide education and explanation of THRR including how the numbers were predicted and where they are located for reference       Expected Outcomes  Short Term: Able to state/look up THRR;Long Term: Able to use THRR to govern intensity when exercising independently;Short Term: Able to use daily as guideline for intensity in rehab       Able to check pulse independently  Yes       Intervention  Provide education and demonstration on how to check pulse in carotid and radial arteries.;Review the importance of being able to check your own pulse for safety during independent exercise       Expected Outcomes  Short Term: Able to explain why pulse checking is important during independent exercise;Long  Term: Able to check pulse independently and accurately       Understanding of Exercise Prescription  Yes       Intervention  Provide education, explanation, and written materials on patient's individual exercise prescription       Expected Outcomes  Short Term: Able to explain program exercise prescription;Long Term: Able to explain home exercise prescription to exercise independently          Exercise Goals Re-Evaluation : Exercise Goals Re-Evaluation    Row Name 09/18/18 1637             Exercise Goal Re-Evaluation   Comments  Patient out with pneumonia, has not started exercise yet.          Discharge Exercise Prescription (Final Exercise Prescription Changes):   Nutrition:  Target Goals: Understanding of nutrition guidelines, daily intake of sodium 1500mg , cholesterol 200mg , calories 30% from fat and 7% or less from saturated fats, daily to have 5 or more servings of fruits and vegetables.  Biometrics: Pre Biometrics - 09/05/18 1127      Pre Biometrics   Height  5\' 11"  (1.803 m)    Weight  (!) 148 kg    BMI (Calculated)  45.53    Triceps Skinfold  32 mm    % Body Fat  42.3 %    Grip Strength  40 kg    Flexibility  0 in    Single Leg Stand  0 seconds        Nutrition Therapy Plan and Nutrition Goals:   Nutrition Assessments:   Nutrition Goals Re-Evaluation:   Nutrition Goals Re-Evaluation:   Nutrition Goals Discharge (Final Nutrition Goals Re-Evaluation):   Psychosocial: Target Goals: Acknowledge presence or absence of significant depression and/or stress, maximize coping skills, provide positive support system. Participant is able to verbalize types and ability to use techniques and skills needed for reducing stress and depression.  Initial Review & Psychosocial Screening: Initial Psych Review & Screening - 09/05/18 1215      Initial Review   Current issues with  Current Anxiety/Panic;Current Stress Concerns      Family Dynamics   Good Support  System?  Yes   Rikhil has his wife for support     Barriers   Psychosocial barriers to participate in program  The patient should benefit from training in stress management and relaxation.;Psychosocial barriers identified (see note)      Screening Interventions   Interventions  Encouraged to exercise;Provide feedback about the scores to participant    Expected Outcomes  Long Term Goal: Stressors or current issues are controlled or eliminated.;Short Term goal: Identification and review  with participant of any Quality of Life or Depression concerns found by scoring the questionnaire.;Long Term goal: The participant improves quality of Life and PHQ9 Scores as seen by post scores and/or verbalization of changes       Quality of Life Scores: Quality of Life - 09/05/18 1133      Quality of Life   Select  Quality of Life      Quality of Life Scores   Health/Function Pre  17.03 %    Socioeconomic Pre  24.93 %    Psych/Spiritual Pre  19.93 %    Family Pre  30 %    GLOBAL Pre  21.16 %      Scores of 19 and below usually indicate a poorer quality of life in these areas.  A difference of  2-3 points is a clinically meaningful difference.  A difference of 2-3 points in the total score of the Quality of Life Index has been associated with significant improvement in overall quality of life, self-image, physical symptoms, and general health in studies assessing change in quality of life.  PHQ-9: Recent Review Flowsheet Data    Depression screen [facility] 2/9 09/05/2018 06/15/2017 05/11/2016   Decreased Interest 0 1 0   Down, Depressed, Hopeless 0 0 0   PHQ - 2 Score 0 1 0   Altered sleeping - 0 2   Tired, decreased energy - 1 3   Change in appetite - 0 0   Feeling bad or failure about yourself  - 0 0   Trouble concentrating - 0 0   Moving slowly or fidgety/restless - 0 3   Suicidal thoughts - 0 0   PHQ-9 Score - 2 8   Difficult doing work/chores - Somewhat difficult -     Interpretation of Total  Score  Total Score Depression Severity:  1-4 = Minimal depression, 5-9 = Mild depression, 10-14 = Moderate depression, 15-19 = Moderately severe depression, 20-27 = Severe depression   Psychosocial Evaluation and Intervention:   Psychosocial Re-Evaluation:   Psychosocial Discharge (Final Psychosocial Re-Evaluation):   Vocational Rehabilitation: Provide vocational rehab assistance to qualifying candidates.   Vocational Rehab Evaluation & Intervention: Vocational Rehab - 09/05/18 1217      Initial Vocational Rehab Evaluation & Intervention   Assessment shows need for Vocational Rehabilitation  No       Education: Education Goals: Education classes will be provided on a weekly basis, covering required topics. Participant will state understanding/return demonstration of topics presented.  Learning Barriers/Preferences: Learning Barriers/Preferences - 09/05/18 1128      Learning Barriers/Preferences   Learning Barriers  Exercise Concerns    Learning Preferences  None       Education Topics: Count Your Pulse:  -Group instruction provided by verbal instruction, demonstration, patient participation and written materials to support subject.  Instructors address importance of being able to find your pulse and how to count your pulse when at home without a heart monitor.  Patients get hands on experience counting their pulse with staff help and individually.   Heart Attack, Angina, and Risk Factor Modification:  -Group instruction provided by verbal instruction, video, and written materials to support subject.  Instructors address signs and symptoms of angina and heart attacks.    Also discuss risk factors for heart disease and how to make changes to improve heart health risk factors.   Functional Fitness:  -Group instruction provided by verbal instruction, demonstration, patient participation, and written materials to support subject.  Instructors address safety  measures for doing  things around the house.  Discuss how to get up and down off the floor, how to pick things up properly, how to safely get out of a chair without assistance, and balance training.   Meditation and Mindfulness:  -Group instruction provided by verbal instruction, patient participation, and written materials to support subject.  Instructor addresses importance of mindfulness and meditation practice to help reduce stress and improve awareness.  Instructor also leads participants through a meditation exercise.    Stretching for Flexibility and Mobility:  -Group instruction provided by verbal instruction, patient participation, and written materials to support subject.  Instructors lead participants through series of stretches that are designed to increase flexibility thus improving mobility.  These stretches are additional exercise for major muscle groups that are typically performed during regular warm up and cool down.   Hands Only CPR:  -Group verbal, video, and participation provides a basic overview of AHA guidelines for community CPR. Role-play of emergencies allow participants the opportunity to practice calling for help and chest compression technique with discussion of AED use.   Hypertension: -Group verbal and written instruction that provides a basic overview of hypertension including the most recent diagnostic guidelines, risk factor reduction with self-care instructions and medication management.    Nutrition I class: Heart Healthy Eating:  -Group instruction provided by PowerPoint slides, verbal discussion, and written materials to support subject matter. The instructor gives an explanation and review of the Therapeutic Lifestyle Changes diet recommendations, which includes a discussion on lipid goals, dietary fat, sodium, fiber, plant stanol/sterol esters, sugar, and the components of a well-balanced, healthy diet.   Nutrition II class: Lifestyle Skills:  -Group instruction provided  by PowerPoint slides, verbal discussion, and written materials to support subject matter. The instructor gives an explanation and review of label reading, grocery shopping for heart health, heart healthy recipe modifications, and ways to make healthier choices when eating out.   Diabetes Question & Answer:  -Group instruction provided by PowerPoint slides, verbal discussion, and written materials to support subject matter. The instructor gives an explanation and review of diabetes co-morbidities, pre- and post-prandial blood glucose goals, pre-exercise blood glucose goals, signs, symptoms, and treatment of hypoglycemia and hyperglycemia, and foot care basics.   Diabetes Blitz:  -Group instruction provided by PowerPoint slides, verbal discussion, and written materials to support subject matter. The instructor gives an explanation and review of the physiology behind type 1 and type 2 diabetes, diabetes medications and rational behind using different medications, pre- and post-prandial blood glucose recommendations and Hemoglobin A1c goals, diabetes diet, and exercise including blood glucose guidelines for exercising safely.    Portion Distortion:  -Group instruction provided by PowerPoint slides, verbal discussion, written materials, and food models to support subject matter. The instructor gives an explanation of serving size versus portion size, changes in portions sizes over the last 20 years, and what consists of a serving from each food group.   Stress Management:  -Group instruction provided by verbal instruction, video, and written materials to support subject matter.  Instructors review role of stress in heart disease and how to cope with stress positively.     Exercising on Your Own:  -Group instruction provided by verbal instruction, power point, and written materials to support subject.  Instructors discuss benefits of exercise, components of exercise, frequency and intensity of  exercise, and end points for exercise.  Also discuss use of nitroglycerin and activating EMS.  Review options of places to exercise outside of rehab.  Review guidelines for sex with heart disease.   Cardiac Drugs I:  -Group instruction provided by verbal instruction and written materials to support subject.  Instructor reviews cardiac drug classes: antiplatelets, anticoagulants, beta blockers, and statins.  Instructor discusses reasons, side effects, and lifestyle considerations for each drug class.   Cardiac Drugs II:  -Group instruction provided by verbal instruction and written materials to support subject.  Instructor reviews cardiac drug classes: angiotensin converting enzyme inhibitors (ACE-I), angiotensin II receptor blockers (ARBs), nitrates, and calcium channel blockers.  Instructor discusses reasons, side effects, and lifestyle considerations for each drug class.   Anatomy and Physiology of the Circulatory System:  Group verbal and written instruction and models provide basic cardiac anatomy and physiology, with the coronary electrical and arterial systems. Review of: AMI, Angina, Valve disease, Heart Failure, Peripheral Artery Disease, Cardiac Arrhythmia, Pacemakers, and the ICD.   Other Education:  -Group or individual verbal, written, or video instructions that support the educational goals of the cardiac rehab program.   Holiday Eating Survival Tips:  -Group instruction provided by PowerPoint slides, verbal discussion, and written materials to support subject matter. The instructor gives patients tips, tricks, and techniques to help them not only survive but enjoy the holidays despite the onslaught of food that accompanies the holidays.   Knowledge Questionnaire Score: Knowledge Questionnaire Score - 09/05/18 1148      Knowledge Questionnaire Score   Pre Score  22/24       Core Components/Risk Factors/Patient Goals at Admission: Personal Goals and Risk Factors at  Admission - 09/05/18 1128      Core Components/Risk Factors/Patient Goals on Admission    Weight Management  Yes;Obesity;Weight Maintenance;Weight Loss    Intervention  Weight Management: Develop a combined nutrition and exercise program designed to reach desired caloric intake, while maintaining appropriate intake of nutrient and fiber, sodium and fats, and appropriate energy expenditure required for the weight goal.;Weight Management: Provide education and appropriate resources to help participant work on and attain dietary goals.;Weight Management/Obesity: Establish reasonable short term and long term weight goals.;Obesity: Provide education and appropriate resources to help participant work on and attain dietary goals.    Admit Weight  326 lb 4.5 oz (148 kg)    Goal Weight: Long Term  246 lb (111.6 kg)    Expected Outcomes  Short Term: Continue to assess and modify interventions until short term weight is achieved;Long Term: Adherence to nutrition and physical activity/exercise program aimed toward attainment of established weight goal;Weight Maintenance: Understanding of the daily nutrition guidelines, which includes 25-35% calories from fat, 7% or less cal from saturated fats, less than 200mg  cholesterol, less than 1.5gm of sodium, & 5 or more servings of fruits and vegetables daily;Weight Loss: Understanding of general recommendations for a balanced deficit meal plan, which promotes 1-2 lb weight loss per week and includes a negative energy balance of [phone] kcal/d;Understanding recommendations for meals to include 15-35% energy as protein, 25-35% energy from fat, 35-60% energy from carbohydrates, less than 200mg  of dietary cholesterol, 20-35 gm of total fiber daily;Understanding of distribution of calorie intake throughout the day with the consumption of 4-5 meals/snacks    Tobacco Cessation  Yes    Expected Outcomes  Short Term: Will demonstrate readiness to quit, by selecting a quit date.;Long  Term: Complete abstinence from all tobacco products for at least 12 months from quit date.;Short Term: Will quit all tobacco product use, adhering to prevention of relapse plan.    Hypertension  Yes  Intervention  Provide education on lifestyle modifcations including regular physical activity/exercise, weight management, moderate sodium restriction and increased consumption of fresh fruit, vegetables, and low fat dairy, alcohol moderation, and smoking cessation.;Monitor prescription use compliance.    Expected Outcomes  Short Term: Continued assessment and intervention until BP is < 140/16mm HG in hypertensive participants. < 130/19mm HG in hypertensive participants with diabetes, heart failure or chronic kidney disease.;Long Term: Maintenance of blood pressure at goal levels.       Core Components/Risk Factors/Patient Goals Review:    Core Components/Risk Factors/Patient Goals at Discharge (Final Review):    ITP Comments: ITP Comments    Row Name 09/05/18 1156 09/19/18 1243         ITP Comments  Dr Fransico Him MD, Medical Director  30 Day ITP Review.  Pt has yet to start exercise.  He fell at home and needs to see his orthopedic MD.  This appt was delayed d/t symptoms of cough.         Comments: See ITP Comments.

## 2018-09-20 ENCOUNTER — Encounter (HOSPITAL_COMMUNITY): Payer: Self-pay

## 2018-09-20 ENCOUNTER — Ambulatory Visit (HOSPITAL_COMMUNITY): Payer: 59

## 2018-09-20 DIAGNOSIS — I214 Non-ST elevation (NSTEMI) myocardial infarction: Secondary | ICD-10-CM

## 2018-09-20 NOTE — Progress Notes (Signed)
Cardiac Individual Treatment Plan  Patient Details  Name: Travis Huff MRN: [id number] Date of Birth: 1960/05/05 Referring Provider:     CARDIAC REHAB PHASE II ORIENTATION from 09/05/2018 in [place]  Referring Provider  Dr. Tamala Julian      Initial Encounter Date:    CARDIAC REHAB PHASE II ORIENTATION from 09/05/2018 in [place]  Date  09/05/18      Visit Diagnosis: NSTEMI (non-ST elevated myocardial infarction) ([place]) 06/20/18 SCAD of LAD  Patient's Home Medications on Admission:  Current Outpatient Medications:  .  albuterol (VENTOLIN HFA) 108 (90 Base) MCG/ACT inhaler, Inhale 2 puffs into the lungs every 6 (six) hours as needed for wheezing., Disp: 18 g, Rfl: 3 .  apixaban (ELIQUIS) 5 MG TABS tablet, Take 1 tablet (5 mg total) by mouth 2 (two) times daily., Disp: 60 tablet, Rfl: 6 .  atorvastatin (LIPITOR) 80 MG tablet, Take 1 tablet (80 mg total) by mouth daily at 6 PM., Disp: 90 tablet, Rfl: 3 .  azithromycin (ZITHROMAX Z-PAK) 250 MG tablet, Take 2 tablets (500 mg) on  Day 1,  followed by 1 tablet (250 mg) once daily on Days 2 through 5., Disp: 6 tablet, Rfl: 0 .  calcium carbonate (TUMS - DOSED IN MG ELEMENTAL CALCIUM) 500 MG chewable tablet, Chew 3-4 tablets by mouth 2 (two) times daily as needed (stomach cramps). , Disp: , Rfl:  .  cyclobenzaprine (FLEXERIL) 10 MG tablet, Take 10 mg by mouth 3 (three) times daily as needed for muscle spasms., Disp: , Rfl:  .  docusate sodium (COLACE) 100 MG capsule, Take 1 capsule (100 mg total) by mouth 2 (two) times daily., Disp: 60 capsule, Rfl: 1 .  DULoxetine (CYMBALTA) 60 MG capsule, Take 1 capsule (60 mg total) by mouth at bedtime., Disp: 90 capsule, Rfl: 3 .  lisinopril-hydrochlorothiazide (ZESTORETIC) 20-25 MG tablet, Take 1 tablet by mouth daily., Disp: 90 tablet, Rfl: 3 .  metoprolol succinate (TOPROL-XL) 100 MG 24 hr tablet, Take 1 tablet (100 mg total) by mouth daily. Take  with or immediately following a meal., Disp: 90 tablet, Rfl: 3 .  metoprolol succinate (TOPROL-XL) 50 MG 24 hr tablet, Take 1 tablet (50 mg total) by mouth daily. In addition to your 100mg  tablet. Take with or immediately following a meal., Disp: 90 tablet, Rfl: 3 .  nitroGLYCERIN (NITROSTAT) 0.4 MG SL tablet, Place 1 tablet (0.4 mg total) under the tongue every 5 (five) minutes x 3 doses as needed for chest pain. If taking third dose, call 911., Disp: 25 tablet, Rfl: 3 .  oxyCODONE (OXYCONTIN) 20 mg 12 hr tablet, Take 20 mg by mouth 2 (two) times daily as needed., Disp: , Rfl:  .  polyethylene glycol (MIRALAX / GLYCOLAX) 17 g packet, Take 17 g by mouth daily as needed for mild constipation., Disp: , Rfl:  .  terbinafine (LAMISIL) 250 MG tablet, TAKE 1 TABLET (250 MG TOTAL) BY MOUTH DAILY., Disp: 90 tablet, Rfl: 1 .  zolpidem (AMBIEN) 5 MG tablet, Take 1 tablet (5 mg total) by mouth at bedtime as needed for sleep., Disp: 30 tablet, Rfl: 3  Past Medical History: Past Medical History:  Diagnosis Date  . Arrhythmia   . Arthritis   . Asthma    as child  . Avascular necrosis of bone of left hip ([place])   . Back pain   . Blood clot in vein 06/2016   x 2 no bleeding disorders found  .  Complication of anesthesia    ketamine hallucinations  . Coronary artery disease   . GERD (gastroesophageal reflux disease)   . Hypertension   . Viral pericarditis 20 yrs ago    Tobacco Use: Social History   Tobacco Use  Smoking Status Former Smoker  . Packs/day: 1.00  . Years: 25.00  . Pack years: 25.00  . Types: Cigarettes  . Quit date: 2000  . Years since quitting: 20.7  Smokeless Tobacco Current User  . Types: Snuff    Labs: Recent Review Flowsheet Data    Labs for ITP Cardiac and Pulmonary Rehab Latest Ref Rng & Units 12/14/2009 05/23/2012 04/17/2017 06/19/2018   Cholestrol <200 mg/dL - 187 229(H) 219(H)   LDLCALC mg/dL (calc) - 104(H) 140(H) 126(H)   HDL > OR = 40 mg/dL - 68 66 75   Trlycerides  <150 mg/dL - 77 113 81   Hemoglobin A1c <5.7 % of total Hgb 5.5 (NOTE)                                                                       According to the ADA Clinical Practice Recommendations for 2011, when HbA1c is used as a screening test:   >=6.5%   Diagnostic of Diabetes Mellitus           (if abnormal result is confirmed)  5.7-6.4%   Increased risk of developing Diabetes Mellitus  References:Diagnosis and Classification of Diabetes Mellitus,Diabetes [id number] 1):S62-S69 and Standards of Medical Care in         Diabetes - 2011,Diabetes Care,2011,34 (Suppl 1):S11-S61. - 5.9(H) 5.6      Capillary Blood Glucose: No results found for: GLUCAP   Exercise Target Goals: Exercise Program Goal: Individual exercise prescription set using results from initial 6 min walk test and THRR while considering  patient's activity barriers and safety.   Exercise Prescription Goal: Initial exercise prescription builds to 30-45 minutes a day of aerobic activity, 2-3 days per week.  Home exercise guidelines will be given to patient during program as part of exercise prescription that the participant will acknowledge.  Activity Barriers & Risk Stratification: Activity Barriers & Cardiac Risk Stratification - 09/05/18 1124      Activity Barriers & Cardiac Risk Stratification   Activity Barriers  Neck/Spine Problems;Arthritis;Back Problems;Joint Problems;Balance Concerns;Left Hip Replacement;Right Hip Replacement;Deconditioning;History of Falls;Muscular Weakness;Assistive Device    Cardiac Risk Stratification  High       6 Minute Walk: 6 Minute Walk    Row Name 09/05/18 1123         6 Minute Walk   Phase  Initial     Distance  856 feet     Walk Time  6 minutes     # of Rest Breaks  0     MPH  1.6     METS  1.8     RPE  12     Perceived Dyspnea   0     VO2 Peak  6.6     Symptoms  No     Resting HR  82 bpm     Resting BP  114/70     Resting Oxygen Saturation   96 %     Exercise  Oxygen Saturation  during 6 min  walk  97 %     Max Ex. HR  109 bpm     Max Ex. BP  140/82     2 Minute Post BP  124/72        Oxygen Initial Assessment:   Oxygen Re-Evaluation:   Oxygen Discharge (Final Oxygen Re-Evaluation):   Initial Exercise Prescription: Initial Exercise Prescription - 09/05/18 1100      Date of Initial Exercise RX and Referring Provider   Date  09/05/18    Referring Provider  Dr. Tamala Julian    Expected Discharge Date  10/18/18      NuStep   Level  2    SPM  75    Minutes  30    METs  1.6      Prescription Details   Frequency (times per week)  2    Duration  Progress to 30 minutes of continuous aerobic without signs/symptoms of physical distress      Intensity   THRR 40-80% of Max Heartrate  65-131    Ratings of Perceived Exertion  11-13      Progression   Progression  Continue to progress workloads to maintain intensity without signs/symptoms of physical distress.      Resistance Training   Training Prescription  Yes    Weight  3 lbs.     Reps  10-15       Perform Capillary Blood Glucose checks as needed.  Exercise Prescription Changes:   Exercise Comments: Exercise Comments    Row Name 09/18/18 1638           Exercise Comments  Unable to review METs and goals. Pt out with pneumonia.          Exercise Goals and Review: Exercise Goals    Row Name 09/05/18 1127             Exercise Goals   Increase Physical Activity  Yes       Intervention  Provide advice, education, support and counseling about physical activity/exercise needs.;Develop an individualized exercise prescription for aerobic and resistive training based on initial evaluation findings, risk stratification, comorbidities and participant's personal goals.       Expected Outcomes  Short Term: Attend rehab on a regular basis to increase amount of physical activity.;Long Term: Add in home exercise to make exercise part of routine and to increase amount of physical  activity.;Long Term: Exercising regularly at least 3-5 days a week.       Increase Strength and Stamina  Yes       Intervention  Provide advice, education, support and counseling about physical activity/exercise needs.;Develop an individualized exercise prescription for aerobic and resistive training based on initial evaluation findings, risk stratification, comorbidities and participant's personal goals.       Expected Outcomes  Short Term: Increase workloads from initial exercise prescription for resistance, speed, and METs.;Short Term: Perform resistance training exercises routinely during rehab and add in resistance training at home;Long Term: Improve cardiorespiratory fitness, muscular endurance and strength as measured by increased METs and functional capacity (6MWT)       Able to understand and use rate of perceived exertion (RPE) scale  Yes       Intervention  Provide education and explanation on how to use RPE scale       Expected Outcomes  Short Term: Able to use RPE daily in rehab to express subjective intensity level;Long Term:  Able to use RPE to guide intensity level when exercising independently  Knowledge and understanding of Target Heart Rate Range (THRR)  Yes       Intervention  Provide education and explanation of THRR including how the numbers were predicted and where they are located for reference       Expected Outcomes  Short Term: Able to state/look up THRR;Long Term: Able to use THRR to govern intensity when exercising independently;Short Term: Able to use daily as guideline for intensity in rehab       Able to check pulse independently  Yes       Intervention  Provide education and demonstration on how to check pulse in carotid and radial arteries.;Review the importance of being able to check your own pulse for safety during independent exercise       Expected Outcomes  Short Term: Able to explain why pulse checking is important during independent exercise;Long Term: Able  to check pulse independently and accurately       Understanding of Exercise Prescription  Yes       Intervention  Provide education, explanation, and written materials on patient's individual exercise prescription       Expected Outcomes  Short Term: Able to explain program exercise prescription;Long Term: Able to explain home exercise prescription to exercise independently          Exercise Goals Re-Evaluation : Exercise Goals Re-Evaluation    Row Name 09/18/18 1637             Exercise Goal Re-Evaluation   Comments  Patient out with pneumonia, has not started exercise yet.          Discharge Exercise Prescription (Final Exercise Prescription Changes):   Nutrition:  Target Goals: Understanding of nutrition guidelines, daily intake of sodium 1500mg , cholesterol 200mg , calories 30% from fat and 7% or less from saturated fats, daily to have 5 or more servings of fruits and vegetables.  Biometrics: Pre Biometrics - 09/05/18 1127      Pre Biometrics   Height  5\' 11"  (1.803 m)    Weight  (!) 326 lb 4.5 oz (148 kg)    BMI (Calculated)  45.53    Triceps Skinfold  32 mm    % Body Fat  42.3 %    Grip Strength  40 kg    Flexibility  0 in    Single Leg Stand  0 seconds        Nutrition Therapy Plan and Nutrition Goals:   Nutrition Assessments:   Nutrition Goals Re-Evaluation:   Nutrition Goals Re-Evaluation:   Nutrition Goals Discharge (Final Nutrition Goals Re-Evaluation):   Psychosocial: Target Goals: Acknowledge presence or absence of significant depression and/or stress, maximize coping skills, provide positive support system. Participant is able to verbalize types and ability to use techniques and skills needed for reducing stress and depression.  Initial Review & Psychosocial Screening: Initial Psych Review & Screening - 09/05/18 1215      Initial Review   Current issues with  Current Anxiety/Panic;Current Stress Concerns      Family Dynamics   Good  Support System?  Yes   Travis Huff has his wife for support     Barriers   Psychosocial barriers to participate in program  The patient should benefit from training in stress management and relaxation.;Psychosocial barriers identified (see note)      Screening Interventions   Interventions  Encouraged to exercise;Provide feedback about the scores to participant    Expected Outcomes  Long Term Goal: Stressors or current issues are controlled or eliminated.;Short Term  goal: Identification and review with participant of any Quality of Life or Depression concerns found by scoring the questionnaire.;Long Term goal: The participant improves quality of Life and PHQ9 Scores as seen by post scores and/or verbalization of changes       Quality of Life Scores: Quality of Life - 09/05/18 1133      Quality of Life   Select  Quality of Life      Quality of Life Scores   Health/Function Pre  17.03 %    Socioeconomic Pre  24.93 %    Psych/Spiritual Pre  19.93 %    Family Pre  30 %    GLOBAL Pre  21.16 %      Scores of 19 and below usually indicate a poorer quality of life in these areas.  A difference of  2-3 points is a clinically meaningful difference.  A difference of 2-3 points in the total score of the Quality of Life Index has been associated with significant improvement in overall quality of life, self-image, physical symptoms, and general health in studies assessing change in quality of life.  PHQ-9: Recent Review Flowsheet Data    Depression screen [facility] 2/9 09/05/2018 06/15/2017 05/11/2016   Decreased Interest 0 1 0   Down, Depressed, Hopeless 0 0 0   PHQ - 2 Score 0 1 0   Altered sleeping - 0 2   Tired, decreased energy - 1 3   Change in appetite - 0 0   Feeling bad or failure about yourself  - 0 0   Trouble concentrating - 0 0   Moving slowly or fidgety/restless - 0 3   Suicidal thoughts - 0 0   PHQ-9 Score - 2 8   Difficult doing work/chores - Somewhat difficult -     Interpretation of  Total Score  Total Score Depression Severity:  1-4 = Minimal depression, 5-9 = Mild depression, 10-14 = Moderate depression, 15-19 = Moderately severe depression, 20-27 = Severe depression   Psychosocial Evaluation and Intervention:   Psychosocial Re-Evaluation:   Psychosocial Discharge (Final Psychosocial Re-Evaluation):   Vocational Rehabilitation: Provide vocational rehab assistance to qualifying candidates.   Vocational Rehab Evaluation & Intervention: Vocational Rehab - 09/05/18 1217      Initial Vocational Rehab Evaluation & Intervention   Assessment shows need for Vocational Rehabilitation  No       Education: Education Goals: Education classes will be provided on a weekly basis, covering required topics. Participant will state understanding/return demonstration of topics presented.  Learning Barriers/Preferences: Learning Barriers/Preferences - 09/05/18 1128      Learning Barriers/Preferences   Learning Barriers  Exercise Concerns    Learning Preferences  None       Education Topics: Count Your Pulse:  -Group instruction provided by verbal instruction, demonstration, patient participation and written materials to support subject.  Instructors address importance of being able to find your pulse and how to count your pulse when at home without a heart monitor.  Patients get hands on experience counting their pulse with staff help and individually.   Heart Attack, Angina, and Risk Factor Modification:  -Group instruction provided by verbal instruction, video, and written materials to support subject.  Instructors address signs and symptoms of angina and heart attacks.    Also discuss risk factors for heart disease and how to make changes to improve heart health risk factors.   Functional Fitness:  -Group instruction provided by verbal instruction, demonstration, patient participation, and written materials to support subject.  Instructors address safety measures for  doing things around the house.  Discuss how to get up and down off the floor, how to pick things up properly, how to safely get out of a chair without assistance, and balance training.   Meditation and Mindfulness:  -Group instruction provided by verbal instruction, patient participation, and written materials to support subject.  Instructor addresses importance of mindfulness and meditation practice to help reduce stress and improve awareness.  Instructor also leads participants through a meditation exercise.    Stretching for Flexibility and Mobility:  -Group instruction provided by verbal instruction, patient participation, and written materials to support subject.  Instructors lead participants through series of stretches that are designed to increase flexibility thus improving mobility.  These stretches are additional exercise for major muscle groups that are typically performed during regular warm up and cool down.   Hands Only CPR:  -Group verbal, video, and participation provides a basic overview of AHA guidelines for community CPR. Role-play of emergencies allow participants the opportunity to practice calling for help and chest compression technique with discussion of AED use.   Hypertension: -Group verbal and written instruction that provides a basic overview of hypertension including the most recent diagnostic guidelines, risk factor reduction with self-care instructions and medication management.    Nutrition I class: Heart Healthy Eating:  -Group instruction provided by PowerPoint slides, verbal discussion, and written materials to support subject matter. The instructor gives an explanation and review of the Therapeutic Lifestyle Changes diet recommendations, which includes a discussion on lipid goals, dietary fat, sodium, fiber, plant stanol/sterol esters, sugar, and the components of a well-balanced, healthy diet.   Nutrition II class: Lifestyle Skills:  -Group instruction  provided by PowerPoint slides, verbal discussion, and written materials to support subject matter. The instructor gives an explanation and review of label reading, grocery shopping for heart health, heart healthy recipe modifications, and ways to make healthier choices when eating out.   Diabetes Question & Answer:  -Group instruction provided by PowerPoint slides, verbal discussion, and written materials to support subject matter. The instructor gives an explanation and review of diabetes co-morbidities, pre- and post-prandial blood glucose goals, pre-exercise blood glucose goals, signs, symptoms, and treatment of hypoglycemia and hyperglycemia, and foot care basics.   Diabetes Blitz:  -Group instruction provided by PowerPoint slides, verbal discussion, and written materials to support subject matter. The instructor gives an explanation and review of the physiology behind type 1 and type 2 diabetes, diabetes medications and rational behind using different medications, pre- and post-prandial blood glucose recommendations and Hemoglobin A1c goals, diabetes diet, and exercise including blood glucose guidelines for exercising safely.    Portion Distortion:  -Group instruction provided by PowerPoint slides, verbal discussion, written materials, and food models to support subject matter. The instructor gives an explanation of serving size versus portion size, changes in portions sizes over the last 20 years, and what consists of a serving from each food group.   Stress Management:  -Group instruction provided by verbal instruction, video, and written materials to support subject matter.  Instructors review role of stress in heart disease and how to cope with stress positively.     Exercising on Your Own:  -Group instruction provided by verbal instruction, power point, and written materials to support subject.  Instructors discuss benefits of exercise, components of exercise, frequency and intensity of  exercise, and end points for exercise.  Also discuss use of nitroglycerin and activating EMS.  Review options of places to exercise  outside of rehab.  Review guidelines for sex with heart disease.   Cardiac Drugs I:  -Group instruction provided by verbal instruction and written materials to support subject.  Instructor reviews cardiac drug classes: antiplatelets, anticoagulants, beta blockers, and statins.  Instructor discusses reasons, side effects, and lifestyle considerations for each drug class.   Cardiac Drugs II:  -Group instruction provided by verbal instruction and written materials to support subject.  Instructor reviews cardiac drug classes: angiotensin converting enzyme inhibitors (ACE-I), angiotensin II receptor blockers (ARBs), nitrates, and calcium channel blockers.  Instructor discusses reasons, side effects, and lifestyle considerations for each drug class.   Anatomy and Physiology of the Circulatory System:  Group verbal and written instruction and models provide basic cardiac anatomy and physiology, with the coronary electrical and arterial systems. Review of: AMI, Angina, Valve disease, Heart Failure, Peripheral Artery Disease, Cardiac Arrhythmia, Pacemakers, and the ICD.   Other Education:  -Group or individual verbal, written, or video instructions that support the educational goals of the cardiac rehab program.   Holiday Eating Survival Tips:  -Group instruction provided by PowerPoint slides, verbal discussion, and written materials to support subject matter. The instructor gives patients tips, tricks, and techniques to help them not only survive but enjoy the holidays despite the onslaught of food that accompanies the holidays.   Knowledge Questionnaire Score: Knowledge Questionnaire Score - 09/05/18 1148      Knowledge Questionnaire Score   Pre Score  22/24       Core Components/Risk Factors/Patient Goals at Admission: Personal Goals and Risk Factors at  Admission - 09/05/18 1128      Core Components/Risk Factors/Patient Goals on Admission    Weight Management  Yes;Obesity;Weight Maintenance;Weight Loss    Intervention  Weight Management: Develop a combined nutrition and exercise program designed to reach desired caloric intake, while maintaining appropriate intake of nutrient and fiber, sodium and fats, and appropriate energy expenditure required for the weight goal.;Weight Management: Provide education and appropriate resources to help participant work on and attain dietary goals.;Weight Management/Obesity: Establish reasonable short term and long term weight goals.;Obesity: Provide education and appropriate resources to help participant work on and attain dietary goals.    Admit Weight  326 lb 4.5 oz (148 kg)    Goal Weight: Long Term  246 lb (111.6 kg)    Expected Outcomes  Short Term: Continue to assess and modify interventions until short term weight is achieved;Long Term: Adherence to nutrition and physical activity/exercise program aimed toward attainment of established weight goal;Weight Maintenance: Understanding of the daily nutrition guidelines, which includes 25-35% calories from fat, 7% or less cal from saturated fats, less than 200mg  cholesterol, less than 1.5gm of sodium, & 5 or more servings of fruits and vegetables daily;Weight Loss: Understanding of general recommendations for a balanced deficit meal plan, which promotes 1-2 lb weight loss per week and includes a negative energy balance of [phone] kcal/d;Understanding recommendations for meals to include 15-35% energy as protein, 25-35% energy from fat, 35-60% energy from carbohydrates, less than 200mg  of dietary cholesterol, 20-35 gm of total fiber daily;Understanding of distribution of calorie intake throughout the day with the consumption of 4-5 meals/snacks    Tobacco Cessation  Yes    Expected Outcomes  Short Term: Will demonstrate readiness to quit, by selecting a quit date.;Long  Term: Complete abstinence from all tobacco products for at least 12 months from quit date.;Short Term: Will quit all tobacco product use, adhering to prevention of relapse plan.    Hypertension  Yes    Intervention  Provide education on lifestyle modifcations including regular physical activity/exercise, weight management, moderate sodium restriction and increased consumption of fresh fruit, vegetables, and low fat dairy, alcohol moderation, and smoking cessation.;Monitor prescription use compliance.    Expected Outcomes  Short Term: Continued assessment and intervention until BP is < 140/13mm HG in hypertensive participants. < 130/75mm HG in hypertensive participants with diabetes, heart failure or chronic kidney disease.;Long Term: Maintenance of blood pressure at goal levels.       Core Components/Risk Factors/Patient Goals Review:    Core Components/Risk Factors/Patient Goals at Discharge (Final Review):    ITP Comments: ITP Comments    Row Name 09/05/18 1156 09/19/18 1243         ITP Comments  Dr Fransico Him MD, Medical Director  30 Day ITP Review.  Pt has yet to start exercise.  He fell at home and needs to see his orthopedic MD.  This appt was delayed d/t symptoms of cough.         Comments: See ITP comment

## 2018-09-23 ENCOUNTER — Telehealth (HOSPITAL_COMMUNITY): Payer: Self-pay | Admitting: Sports Medicine

## 2018-09-23 ENCOUNTER — Ambulatory Visit (HOSPITAL_COMMUNITY): Payer: 59

## 2018-09-24 ENCOUNTER — Other Ambulatory Visit (HOSPITAL_COMMUNITY): Payer: 59

## 2018-09-24 DIAGNOSIS — Z471 Aftercare following joint replacement surgery: Secondary | ICD-10-CM | POA: Diagnosis not present

## 2018-09-24 DIAGNOSIS — Z96642 Presence of left artificial hip joint: Secondary | ICD-10-CM | POA: Diagnosis not present

## 2018-09-25 ENCOUNTER — Ambulatory Visit (HOSPITAL_COMMUNITY): Payer: 59

## 2018-09-26 ENCOUNTER — Encounter: Payer: Self-pay | Admitting: Podiatry

## 2018-09-26 ENCOUNTER — Other Ambulatory Visit: Payer: Self-pay

## 2018-09-26 ENCOUNTER — Ambulatory Visit: Payer: 59 | Admitting: Podiatry

## 2018-09-26 VITALS — BP 138/81 | HR 72

## 2018-09-26 DIAGNOSIS — M2042 Other hammer toe(s) (acquired), left foot: Secondary | ICD-10-CM

## 2018-09-26 DIAGNOSIS — M2041 Other hammer toe(s) (acquired), right foot: Secondary | ICD-10-CM

## 2018-09-26 DIAGNOSIS — B351 Tinea unguium: Secondary | ICD-10-CM

## 2018-09-26 DIAGNOSIS — L97311 Non-pressure chronic ulcer of right ankle limited to breakdown of skin: Secondary | ICD-10-CM

## 2018-09-26 DIAGNOSIS — L603 Nail dystrophy: Secondary | ICD-10-CM

## 2018-09-26 DIAGNOSIS — G629 Polyneuropathy, unspecified: Secondary | ICD-10-CM | POA: Diagnosis not present

## 2018-09-26 MED ORDER — DOXYCYCLINE HYCLATE 100 MG PO TABS: 100.0000 mg | ORAL_TABLET | Freq: Two times a day (BID) | ORAL | 0 refills | Status: DC

## 2018-09-26 MED ORDER — MUPIROCIN 2 % EX OINT: 1.0000 "application " | TOPICAL_OINTMENT | Freq: Two times a day (BID) | CUTANEOUS | 2 refills | Status: DC

## 2018-09-26 MED FILL — MUPIROCIN 2% OINTMENT: 2 | 15 days supply | Qty: 22 | Fill #0

## 2018-09-26 MED FILL — DOXYCYCLINE HYCLATE 100 MG: 100 | 10 days supply | Qty: 20 | Fill #0

## 2018-09-26 NOTE — Progress Notes (Signed)
Subjective:   Patient ID: Travis Huff, male   DOB: 58 y.o.   MRN: [id number]   HPI 58 year old male presents the office today for multiple concerns.  He states that he has a wound on the side of the right ankle which is been ongoing for some time.  This started after he fell in the driveway with an abrasion.  He is concerned it is taking a long time to heal.  Currently denies any drainage or pus.  He is concerned that some redness..  Also concern for neuropathy.  He states this is coming from his back.  Skin numbness to his feet.  He is also been on Lamisil previously for nail fungus without any improvement.  No pain in the nails currently.  He also has hammertoes and is interested in orthotics.   Review of Systems  All other systems reviewed and are negative.       Objective:  Physical Exam  General: AAO x3, NAD  Dermatological: On the lateral aspect the right ankle is what appears to be a scab with a superficial wound is present underneath this and there is a small moderate drainage identified today.  Mild pain surrounding erythema no ascending cellulitis.  No fluctuation potation.  No malodor.  Nails are dystrophic, discolored with yellow-brown discoloration.  Vascular: Dorsalis Pedis artery and Posterior Tibial artery pedal pulses are 2/4 bilateral with immedate capillary fill time. There is no pain with calf compression, swelling, warmth, erythema.   Neruologic: Sensation decreased to Semmes-Weinstein monofilament  Musculoskeletal: Hammertoes are present.  Muscular strength 5/5 in all groups tested bilateral.  Gait: Unassisted, Nonantalgic.      Assessment:   Right lateral ankle ulceration with wound drainage; onychomycosis; hammertoes; neuropathy    Plan:  -Treatment options discussed including all alternatives, risks, and complications -Etiology of symptoms were discussed -Debrided nails x10 without any complications or bleeding as of this for culture to [facility] labs.   -Continue monitor neuropathy- stable without any acute worsening -In regards to the wound on the lateral ankle took a wound culture today given the wound drainage.  Prescribed doxycycline as well as mupirocin to apply topically daily.[name]  ABIs normal and able to palpate pulses. -We will check orthotic coverage  Return in about 1 week (around 10/03/2018).  Trula Slade DPM

## 2018-09-26 NOTE — Patient Instructions (Signed)
If was nice to meet you today. If you have any questions or any further concerns, please feel fee to give me a call. You can call our office at [phone] or please feel fee to send me a message through MyChart.   

## 2018-09-27 ENCOUNTER — Ambulatory Visit (HOSPITAL_COMMUNITY): Payer: 59

## 2018-09-27 ENCOUNTER — Encounter (HOSPITAL_BASED_OUTPATIENT_CLINIC_OR_DEPARTMENT_OTHER): Payer: 59 | Admitting: Cardiology

## 2018-09-29 LAB — WOUND CULTURE
MICRO NUMBER:: 918686
RESULT:: NO GROWTH
SPECIMEN QUALITY:: ADEQUATE

## 2018-09-30 ENCOUNTER — Ambulatory Visit (HOSPITAL_BASED_OUTPATIENT_CLINIC_OR_DEPARTMENT_OTHER): Payer: 59

## 2018-09-30 ENCOUNTER — Other Ambulatory Visit: Payer: Self-pay

## 2018-09-30 ENCOUNTER — Ambulatory Visit (HOSPITAL_COMMUNITY): Payer: 59

## 2018-09-30 DIAGNOSIS — I480 Paroxysmal atrial fibrillation: Secondary | ICD-10-CM | POA: Insufficient documentation

## 2018-09-30 DIAGNOSIS — M25552 Pain in left hip: Secondary | ICD-10-CM | POA: Diagnosis not present

## 2018-09-30 DIAGNOSIS — Z7901 Long term (current) use of anticoagulants: Secondary | ICD-10-CM | POA: Diagnosis not present

## 2018-09-30 DIAGNOSIS — Z96653 Presence of artificial knee joint, bilateral: Secondary | ICD-10-CM | POA: Diagnosis not present

## 2018-09-30 DIAGNOSIS — Z03818 Encounter for observation for suspected exposure to other biological agents ruled out: Secondary | ICD-10-CM | POA: Diagnosis not present

## 2018-09-30 DIAGNOSIS — Z96643 Presence of artificial hip joint, bilateral: Secondary | ICD-10-CM | POA: Diagnosis not present

## 2018-09-30 DIAGNOSIS — I2510 Atherosclerotic heart disease of native coronary artery without angina pectoris: Secondary | ICD-10-CM | POA: Diagnosis not present

## 2018-09-30 DIAGNOSIS — Z6841 Body Mass Index (BMI) 40.0 and over, adult: Secondary | ICD-10-CM | POA: Diagnosis not present

## 2018-09-30 DIAGNOSIS — Z87891 Personal history of nicotine dependence: Secondary | ICD-10-CM | POA: Diagnosis not present

## 2018-09-30 DIAGNOSIS — Z20828 Contact with and (suspected) exposure to other viral communicable diseases: Secondary | ICD-10-CM | POA: Diagnosis not present

## 2018-09-30 DIAGNOSIS — T84021A Dislocation of internal left hip prosthesis, initial encounter: Secondary | ICD-10-CM | POA: Diagnosis not present

## 2018-09-30 DIAGNOSIS — I110 Hypertensive heart disease with heart failure: Secondary | ICD-10-CM | POA: Diagnosis not present

## 2018-09-30 MED ORDER — PERFLUTREN LIPID MICROSPHERE
1.0000 mL | INTRAVENOUS | Status: AC | PRN
  Administered 2018-09-30: 3 mL via INTRAVENOUS

## 2018-09-30 MED FILL — DULOXETINE HCL 60 MG CPEP: 60 | 90 days supply | Qty: 90 | Fill #2

## 2018-09-30 MED FILL — ZOLPIDEM TARTRATE 5 MG TAB: 5 | 30 days supply | Qty: 30 | Fill #3

## 2018-09-30 MED FILL — ELIQUIS 5 MG TABLET: 5 | 30 days supply | Qty: 60 | Fill #1

## 2018-09-30 MED FILL — METOPROLOL SUCCINATE ER 50: 50 | 90 days supply | Qty: 90 | Fill #0

## 2018-10-01 ENCOUNTER — Telehealth (HOSPITAL_COMMUNITY): Payer: Self-pay

## 2018-10-01 NOTE — Telephone Encounter (Signed)
Unsuccessful telephone encounter to Mr. Travis Huff for follow up to recent dx of pneumonia and need for orthopedic clearance to begin cardiac rehab. HIPAA compliant VM message left requesting call back at [phone]. Damiya Sandefur E. Rollene Rotunda, RN

## 2018-10-01 NOTE — Telephone Encounter (Signed)
Returning patient call. Patient states he has completed his antibiotic recently prescribed for questionable pneumonia. Symptoms of URI completely resolved. He also states he has recently presented to [facility] for possible hip dislocation. Per patient, he was told it was not his hip dislocating that caused his recent fall. He states Xray was completed and prosthetic hip remains in place. RN CM has contacted orthopedist for exercise clearance/restrictions. Awaiting clearance at this time. Patient will tentatively begin CR exercise program on Monday 10/07/2018 pending documentation from ortho. Arleen Bar E. Rollene Rotunda, RN

## 2018-10-02 ENCOUNTER — Ambulatory Visit (HOSPITAL_COMMUNITY): Payer: 59

## 2018-10-03 ENCOUNTER — Emergency Department (HOSPITAL_COMMUNITY): Payer: 59

## 2018-10-03 ENCOUNTER — Inpatient Hospital Stay (HOSPITAL_COMMUNITY)
Admission: EM | Admit: 2018-10-03 | Discharge: 2018-10-04 | DRG: 560 | Disposition: A | Discharge status: Home / Self Care | Payer: 59 | Attending: Orthopedic Surgery | Admitting: Orthopedic Surgery

## 2018-10-03 ENCOUNTER — Other Ambulatory Visit: Payer: 59 | Admitting: Orthotics

## 2018-10-03 ENCOUNTER — Ambulatory Visit: Payer: 59 | Admitting: Podiatry

## 2018-10-03 ENCOUNTER — Encounter (HOSPITAL_COMMUNITY): Payer: Self-pay | Admitting: Emergency Medicine

## 2018-10-03 ENCOUNTER — Emergency Department (HOSPITAL_COMMUNITY): Payer: 59 | Admitting: Certified Registered Nurse Anesthetist

## 2018-10-03 ENCOUNTER — Other Ambulatory Visit: Payer: Self-pay

## 2018-10-03 ENCOUNTER — Encounter (HOSPITAL_COMMUNITY)
Admission: EM | Disposition: A | Discharge status: Home / Self Care | Payer: Self-pay | Source: Home / Self Care | Attending: Orthopedic Surgery

## 2018-10-03 DIAGNOSIS — E782 Mixed hyperlipidemia: Secondary | ICD-10-CM | POA: Diagnosis not present

## 2018-10-03 DIAGNOSIS — Z96649 Presence of unspecified artificial hip joint: Secondary | ICD-10-CM

## 2018-10-03 DIAGNOSIS — I252 Old myocardial infarction: Secondary | ICD-10-CM

## 2018-10-03 DIAGNOSIS — Z419 Encounter for procedure for purposes other than remedying health state, unspecified: Secondary | ICD-10-CM

## 2018-10-03 DIAGNOSIS — J45909 Unspecified asthma, uncomplicated: Secondary | ICD-10-CM | POA: Diagnosis present

## 2018-10-03 DIAGNOSIS — Z6841 Body Mass Index (BMI) 40.0 and over, adult: Secondary | ICD-10-CM | POA: Diagnosis not present

## 2018-10-03 DIAGNOSIS — K219 Gastro-esophageal reflux disease without esophagitis: Secondary | ICD-10-CM | POA: Diagnosis present

## 2018-10-03 DIAGNOSIS — I2510 Atherosclerotic heart disease of native coronary artery without angina pectoris: Secondary | ICD-10-CM | POA: Diagnosis present

## 2018-10-03 DIAGNOSIS — Z20828 Contact with and (suspected) exposure to other viral communicable diseases: Secondary | ICD-10-CM | POA: Diagnosis present

## 2018-10-03 DIAGNOSIS — Z7901 Long term (current) use of anticoagulants: Secondary | ICD-10-CM

## 2018-10-03 DIAGNOSIS — I480 Paroxysmal atrial fibrillation: Secondary | ICD-10-CM | POA: Diagnosis not present

## 2018-10-03 DIAGNOSIS — Z87891 Personal history of nicotine dependence: Secondary | ICD-10-CM

## 2018-10-03 DIAGNOSIS — S73005A Unspecified dislocation of left hip, initial encounter: Secondary | ICD-10-CM | POA: Diagnosis present

## 2018-10-03 DIAGNOSIS — Z96642 Presence of left artificial hip joint: Secondary | ICD-10-CM | POA: Diagnosis not present

## 2018-10-03 DIAGNOSIS — R61 Generalized hyperhidrosis: Secondary | ICD-10-CM | POA: Diagnosis not present

## 2018-10-03 DIAGNOSIS — Z7982 Long term (current) use of aspirin: Secondary | ICD-10-CM

## 2018-10-03 DIAGNOSIS — Y792 Prosthetic and other implants, materials and accessory orthopedic devices associated with adverse incidents: Secondary | ICD-10-CM | POA: Diagnosis present

## 2018-10-03 DIAGNOSIS — Z86711 Personal history of pulmonary embolism: Secondary | ICD-10-CM | POA: Diagnosis not present

## 2018-10-03 DIAGNOSIS — Z96653 Presence of artificial knee joint, bilateral: Secondary | ICD-10-CM | POA: Diagnosis present

## 2018-10-03 DIAGNOSIS — R52 Pain, unspecified: Secondary | ICD-10-CM | POA: Diagnosis not present

## 2018-10-03 DIAGNOSIS — I110 Hypertensive heart disease with heart failure: Secondary | ICD-10-CM | POA: Diagnosis present

## 2018-10-03 DIAGNOSIS — Z79899 Other long term (current) drug therapy: Secondary | ICD-10-CM | POA: Diagnosis not present

## 2018-10-03 DIAGNOSIS — Z03818 Encounter for observation for suspected exposure to other biological agents ruled out: Secondary | ICD-10-CM | POA: Diagnosis not present

## 2018-10-03 DIAGNOSIS — T84021A Dislocation of internal left hip prosthesis, initial encounter: Principal | ICD-10-CM | POA: Diagnosis present

## 2018-10-03 DIAGNOSIS — Z96643 Presence of artificial hip joint, bilateral: Secondary | ICD-10-CM | POA: Diagnosis present

## 2018-10-03 DIAGNOSIS — M25552 Pain in left hip: Secondary | ICD-10-CM | POA: Diagnosis not present

## 2018-10-03 DIAGNOSIS — I255 Ischemic cardiomyopathy: Secondary | ICD-10-CM | POA: Diagnosis present

## 2018-10-03 DIAGNOSIS — T84029A Dislocation of unspecified internal joint prosthesis, initial encounter: Secondary | ICD-10-CM

## 2018-10-03 DIAGNOSIS — I10 Essential (primary) hypertension: Secondary | ICD-10-CM | POA: Diagnosis not present

## 2018-10-03 HISTORY — DX: Unspecified dislocation of left hip, initial encounter: S73.005A

## 2018-10-03 HISTORY — PX: HIP CLOSED REDUCTION: SHX983

## 2018-10-03 LAB — SURGICAL PCR SCREEN
MRSA, PCR: NEGATIVE
Staphylococcus aureus: NEGATIVE

## 2018-10-03 LAB — SARS CORONAVIRUS 2 BY RT PCR (HOSPITAL ORDER, PERFORMED IN ~~LOC~~ HOSPITAL LAB): SARS Coronavirus 2: NEGATIVE

## 2018-10-03 SURGERY — CLOSED REDUCTION, HIP
Anesthesia: General | Site: Hip | Laterality: Left

## 2018-10-03 MED ORDER — ATORVASTATIN CALCIUM 80 MG PO TABS
80.0000 mg | ORAL_TABLET | Freq: Every day | ORAL | Status: DC
  Filled 2018-10-03: qty 1

## 2018-10-03 MED ORDER — ASPIRIN 81 MG PO CHEW
81.0000 mg | CHEWABLE_TABLET | Freq: Every day | ORAL | Status: DC
  Administered 2018-10-04: 11:00:00 81 mg via ORAL
  Filled 2018-10-03: qty 1

## 2018-10-03 MED ORDER — HYDROCHLOROTHIAZIDE 25 MG PO TABS
25.0000 mg | ORAL_TABLET | Freq: Every day | ORAL | Status: DC
  Administered 2018-10-04: 11:00:00 25 mg via ORAL
  Filled 2018-10-03: qty 1

## 2018-10-03 MED ORDER — LISINOPRIL 20 MG PO TABS
20.0000 mg | ORAL_TABLET | Freq: Every day | ORAL | Status: DC
  Administered 2018-10-04: 11:00:00 20 mg via ORAL
  Filled 2018-10-03: qty 1

## 2018-10-03 MED ORDER — DOCUSATE SODIUM 100 MG PO CAPS: 100.0000 mg | ORAL_CAPSULE | Freq: Two times a day (BID) | ORAL | Status: DC

## 2018-10-03 MED ORDER — DULOXETINE HCL 60 MG PO CPEP
60.0000 mg | ORAL_CAPSULE | Freq: Every day | ORAL | Status: DC
  Administered 2018-10-03: 60 mg via ORAL
  Filled 2018-10-03: qty 1

## 2018-10-03 MED ORDER — APIXABAN 5 MG PO TABS
5.0000 mg | ORAL_TABLET | Freq: Two times a day (BID) | ORAL | Status: DC
  Administered 2018-10-03 – 2018-10-04 (×2): 5 mg via ORAL
  Filled 2018-10-03 (×2): qty 1

## 2018-10-03 MED ORDER — ZOLPIDEM TARTRATE 5 MG PO TABS
5.0000 mg | ORAL_TABLET | Freq: Every evening | ORAL | Status: DC | PRN
  Administered 2018-10-03: 5 mg via ORAL
  Filled 2018-10-03: qty 1

## 2018-10-03 MED ORDER — OXYCODONE HCL ER 20 MG PO T12A: 20.0000 mg | EXTENDED_RELEASE_TABLET | Freq: Two times a day (BID) | ORAL | Status: DC | PRN

## 2018-10-03 MED ORDER — LACTATED RINGERS IV SOLN
INTRAVENOUS | Status: DC
  Administered 2018-10-03: 17:00:00 via INTRAVENOUS

## 2018-10-03 MED ORDER — METOPROLOL SUCCINATE ER 50 MG PO TB24
50.0000 mg | ORAL_TABLET | Freq: Every day | ORAL | Status: DC
  Administered 2018-10-04: 50 mg via ORAL
  Filled 2018-10-03: qty 1

## 2018-10-03 MED ORDER — FENTANYL CITRATE (PF) 100 MCG/2ML IJ SOLN
100.0000 ug | Freq: Once | INTRAMUSCULAR | Status: AC
  Administered 2018-10-03: 100 ug via INTRAVENOUS
  Filled 2018-10-03: qty 2

## 2018-10-03 MED ORDER — ACETAMINOPHEN 10 MG/ML IV SOLN: 1000.0000 mg | Freq: Once | INTRAVENOUS | Status: DC | PRN

## 2018-10-03 MED ORDER — CYCLOBENZAPRINE HCL 10 MG PO TABS: 10.0000 mg | ORAL_TABLET | Freq: Three times a day (TID) | ORAL | Status: DC | PRN

## 2018-10-03 MED ORDER — PROPOFOL 10 MG/ML IV BOLUS
INTRAVENOUS | Status: DC | PRN
  Administered 2018-10-03: 200 mg via INTRAVENOUS

## 2018-10-03 MED ORDER — HYDROMORPHONE HCL 2 MG PO TABS: 4.0000 mg | ORAL_TABLET | ORAL | Status: DC | PRN

## 2018-10-03 MED ORDER — ONDANSETRON HCL 4 MG/2ML IJ SOLN
INTRAMUSCULAR | Status: DC | PRN
  Administered 2018-10-03: 4 mg via INTRAVENOUS

## 2018-10-03 MED ORDER — DOCUSATE SODIUM 100 MG PO CAPS
100.0000 mg | ORAL_CAPSULE | Freq: Two times a day (BID) | ORAL | Status: DC
  Administered 2018-10-03 – 2018-10-04 (×2): 100 mg via ORAL
  Filled 2018-10-03 (×2): qty 1

## 2018-10-03 MED ORDER — TERBINAFINE HCL 250 MG PO TABS
250.0000 mg | ORAL_TABLET | Freq: Every day | ORAL | Status: DC
  Administered 2018-10-04: 11:00:00 250 mg via ORAL
  Filled 2018-10-03: qty 1

## 2018-10-03 MED ORDER — SUCCINYLCHOLINE CHLORIDE 20 MG/ML IJ SOLN
INTRAMUSCULAR | Status: DC | PRN
  Administered 2018-10-03: 100 mg via INTRAVENOUS

## 2018-10-03 MED ORDER — LISINOPRIL-HYDROCHLOROTHIAZIDE 20-25 MG PO TABS: 1.0000 | ORAL_TABLET | Freq: Every day | ORAL | Status: DC

## 2018-10-03 MED ORDER — HYDROMORPHONE HCL 1 MG/ML IJ SOLN: 0.2500 mg | INTRAMUSCULAR | Status: DC | PRN

## 2018-10-03 MED ORDER — METOCLOPRAMIDE HCL 5 MG PO TABS: 5.0000 mg | ORAL_TABLET | Freq: Three times a day (TID) | ORAL | Status: DC | PRN

## 2018-10-03 MED ORDER — LIDOCAINE HCL (CARDIAC) PF 100 MG/5ML IV SOSY
PREFILLED_SYRINGE | INTRAVENOUS | Status: DC | PRN
  Administered 2018-10-03: 100 mg via INTRAVENOUS

## 2018-10-03 MED ORDER — POLYETHYLENE GLYCOL 3350 17 G PO PACK: 17.0000 g | PACK | Freq: Every day | ORAL | Status: DC | PRN

## 2018-10-03 MED ORDER — PROMETHAZINE HCL 25 MG/ML IJ SOLN: 6.2500 mg | INTRAMUSCULAR | Status: DC | PRN

## 2018-10-03 MED ORDER — NITROGLYCERIN 0.4 MG SL SUBL: 0.4000 mg | SUBLINGUAL_TABLET | SUBLINGUAL | Status: DC | PRN

## 2018-10-03 MED ORDER — ONDANSETRON HCL 4 MG PO TABS: 4.0000 mg | ORAL_TABLET | Freq: Four times a day (QID) | ORAL | Status: DC | PRN

## 2018-10-03 MED ORDER — METOCLOPRAMIDE HCL 5 MG/ML IJ SOLN: 5.0000 mg | Freq: Three times a day (TID) | INTRAMUSCULAR | Status: DC | PRN

## 2018-10-03 MED ORDER — OXYCODONE HCL ER 20 MG PO T12A
20.0000 mg | EXTENDED_RELEASE_TABLET | Freq: Two times a day (BID) | ORAL | Status: DC | PRN
  Administered 2018-10-04: 02:00:00 20 mg via ORAL
  Filled 2018-10-03: qty 1

## 2018-10-03 MED ORDER — FENTANYL CITRATE (PF) 100 MCG/2ML IJ SOLN
100.0000 ug | Freq: Once | INTRAMUSCULAR | Status: AC
  Administered 2018-10-03: 16:00:00 100 ug via INTRAVENOUS
  Filled 2018-10-03: qty 2

## 2018-10-03 MED ORDER — METOPROLOL SUCCINATE ER 100 MG PO TB24
100.0000 mg | ORAL_TABLET | Freq: Every day | ORAL | Status: DC
  Administered 2018-10-04: 09:00:00 100 mg via ORAL
  Filled 2018-10-03: qty 1

## 2018-10-03 MED ORDER — ONDANSETRON HCL 4 MG/2ML IJ SOLN: 4.0000 mg | Freq: Four times a day (QID) | INTRAMUSCULAR | Status: DC | PRN

## 2018-10-03 SURGICAL SUPPLY — 1 items: PILLOW ABDUCTION MEDIUM (MISCELLANEOUS) ×3 IMPLANT

## 2018-10-03 NOTE — ED Notes (Signed)
Ortho and Respiratory have been notified about sedation procedure.

## 2018-10-03 NOTE — Anesthesia Procedure Notes (Signed)
Date/Time: 10/03/2018 5:55 PM Performed by: Cynda Familia, CRNA Oxygen Delivery Method: Simple face mask Placement Confirmation: positive ETCO2 and breath sounds checked- equal and bilateral Dental Injury: Teeth and Oropharynx as per pre-operative assessment

## 2018-10-03 NOTE — Discharge Instructions (Signed)
Posterior hip precautions Wear abduction at brace at all times

## 2018-10-03 NOTE — Anesthesia Preprocedure Evaluation (Addendum)
Anesthesia Evaluation  Patient identified by MRN, date of birth, ID band Patient awake    Reviewed: Allergy & Precautions, NPO status , Patient's Chart, lab work & pertinent test results, reviewed documented beta blocker date and time   History of Anesthesia Complications Negative for: history of anesthetic complications  Airway Mallampati: II  TM Distance: >3 FB Neck ROM: Full    Dental no notable dental hx.    Pulmonary former smoker,    Pulmonary exam normal        Cardiovascular hypertension, Pt. on medications and Pt. on home beta blockers + CAD (on Eliquis) and + Past MI  Normal cardiovascular exam     Neuro/Psych Anxiety negative neurological ROS     GI/Hepatic negative GI ROS, Neg liver ROS,   Endo/Other  Morbid obesity  Renal/GU negative Renal ROS  negative genitourinary   Musculoskeletal  (+) Arthritis ,   Abdominal   Peds  Hematology negative hematology ROS (+)   Anesthesia Other Findings Day of surgery medications reviewed with patient.  Reproductive/Obstetrics negative OB ROS                            Anesthesia Physical Anesthesia Plan  ASA: III  Anesthesia Plan: General   Post-op Pain Management:    Induction: Intravenous  PONV Risk Score and Plan: 3 and Treatment may vary due to age or medical condition, Ondansetron and Midazolam  Airway Management Planned: Oral ETT  Additional Equipment: None  Intra-op Plan:   Post-operative Plan: Extubation in OR  Informed Consent: I have reviewed the patients History and Physical, chart, labs and discussed the procedure including the risks, benefits and alternatives for the proposed anesthesia with the patient or authorized representative who has indicated his/her understanding and acceptance.     Dental advisory given  Plan Discussed with: CRNA  Anesthesia Plan Comments:        Anesthesia Quick  Evaluation

## 2018-10-03 NOTE — Anesthesia Procedure Notes (Signed)
Procedure Name: Intubation Date/Time: 10/03/2018 5:37 PM Performed by: Cynda Familia, CRNA Pre-anesthesia Checklist: Patient identified, Emergency Drugs available, Suction available and Patient being monitored Patient Re-evaluated:Patient Re-evaluated prior to induction Oxygen Delivery Method: Circle System Utilized Preoxygenation: Pre-oxygenation with 100% oxygen Induction Type: IV induction Ventilation: Mask ventilation without difficulty Tube type: Oral Number of attempts: 1 Airway Equipment and Method: Stylet,  Oral airway and Video-laryngoscopy Placement Confirmation: ETT inserted through vocal cords under direct vision,  positive ETCO2 and breath sounds checked- equal and bilateral Secured at: 23 cm Tube secured with: Tape Dental Injury: Teeth and Oropharynx as per pre-operative assessment  Comments: Smooth IV induction Howze-- intubation AM CRNA atraumatic via glidescope-- teeth and mouth as preop-- bilat BS

## 2018-10-03 NOTE — Anesthesia Postprocedure Evaluation (Signed)
Anesthesia Post Note  Patient: Travis Huff  Procedure(s) Performed: CLOSED REDUCTION HIP (Left Hip)     Patient location during evaluation: PACU Anesthesia Type: General Level of consciousness: awake and alert and oriented Pain management: pain level controlled Vital Signs Assessment: post-procedure vital signs reviewed and stable Respiratory status: spontaneous breathing, nonlabored ventilation and respiratory function stable Cardiovascular status: blood pressure returned to baseline Postop Assessment: no apparent nausea or vomiting Anesthetic complications: no    Last Vitals:  Vitals:   10/03/18 1710 10/03/18 1800  BP: (!) 120/97 (P) 127/70  Pulse: 65 (P) 75  Resp: 18 (P) 17  Temp: 37.1 C (P) 36.7 C  SpO2: 96% (P) 100%    Last Pain:  Vitals:   10/03/18 1710  TempSrc: Oral  PainSc:                  Brennan Bailey

## 2018-10-03 NOTE — Op Note (Signed)
OPERATIVE REPORT   10/03/2018  6:04 PM  PATIENT:  Travis Huff   SURGEON:  Bertram Savin, MD  ASSISTANT:  Staff.   PREOPERATIVE DIAGNOSIS:  Dislocated Left total hip arthroplasty.  POSTOPERATIVE DIAGNOSIS:  Same.  PROCEDURE: Closed reduction left hip.  ANESTHESIA:   GETA.  ANTIBIOTICS: None.  IMPLANTS: None.  SPECIMENS: None.  COMPLICATIONS: None.  DISPOSITION: Stable to PACU.  SURGICAL INDICATIONS:  [name] is a 59 y.o. 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  He says that his hip dislocated while he was seated and bending forward yesterday.  When the pain did not improve, he came to the emergency department today.  X-ray showed posterior dislocation of the left total hip replacement.  This is the first dislocation episode.  The emergency department staff deemed him too high risk for sedation in the emergency department.  Therefore orthopedic consultation was placed.  We discussed closed reduction in the operating room.  They understand that the biggest risk is recurrent instability.  PROCEDURE IN DETAIL: The patient was identified in the holding areas and 2 identifiers.  The surgical site was marked by myself.  He was taken to the operating room, and general anesthesia was induced on the stretcher.  Timeout was called, verifying site and site of surgery.  Antibiotics were not given, as none were indicated.    Using traction and rotation, I was able to reduce the hip.  The reduction was somewhat challenging.  Portable AP x-ray was used to confirm the reduction.  I did perform stability testing, and the hip did not impinge or dislocate.  The patient was then extubated and taken to the PACU in stable condition.  Sponge, needle, and instrument  counts were correct at the end of the case x2.  There were no known complications.  POSTOPERATIVE PLAN: Postoperatively, the patient be admitted to the hospital for overnight observation.  Due to his significant cardiac history, going to place him on telemetry overnight.  Plan to order hip abduction brace from [employer].  He may weight-bear as tolerated left lower extremity.  Posterior hip precautions.  Wear abduction brace at all times.  Mobilize out of bed tomorrow with physical therapy once the brace has arrived.  He may discharge home tomorrow.  He will need to return to the office in 2 weeks for repeat radiographs.

## 2018-10-03 NOTE — Transfer of Care (Signed)
Immediate Anesthesia Transfer of Care Note  Patient: Travis Huff  Procedure(s) Performed: CLOSED REDUCTION HIP (Left Hip)  Patient Location: PACU  Anesthesia Type:General  Level of Consciousness: awake and alert   Airway & Oxygen Therapy: Patient Spontanous Breathing and Patient connected to face mask oxygen  Post-op Assessment: Report given to RN and Post -op Vital signs reviewed and stable  Post vital signs: Reviewed and stable  Last Vitals:  Vitals Value Taken Time  BP 127/70 10/03/18 1800  Temp 36.7 C 10/03/18 1800  Pulse 75 10/03/18 1800  Resp 17 10/03/18 1800  SpO2 100 % 10/03/18 1800    Last Pain:  Vitals:   10/03/18 1800  TempSrc:   PainSc: 0-No pain         Complications: No apparent anesthesia complications

## 2018-10-03 NOTE — ED Provider Notes (Addendum)
[facility] DEPT Provider Note   CSN: [id number] Arrival date & time: 10/03/18  1056     History   Chief Complaint Chief Complaint  Patient presents with  . Hip Pain    HPI Travis Huff is a 58 y.o. male with history of paroxysmal atrial fibrillation, multiple PEs on Eliquis, non-STEMI following a carotid artery dissection, hypertension, obesity, chronic combined heart failure, ischemic cardiomyopathy and recent left hip arthroplasty by Dr. Lyla Glassing March 2020 brought to the ER by EMS for evaluation of sudden, severe constant left hip pain that began around 6:30 PM yesterday.  He was standing up from a sitting position when he felt the sudden pain.  He could not put weight on it and he sat back down.  He was able to walk with his walker and two-person assist to the recliner where he has been sitting since 6 PM yesterday.  This morning he did not feel like he could walk on it and called 911.  Has associated pain in soreness in his left low back and left buttock.  He denies any leg swelling, redness, warmth, tingling, loss of sensation.  Since his surgery states that he has been feeling his hip pop in and out at least 3 times prior.  He had follow-up with Dr. Lyla Glassing last week 29-month follow-up.  Received fentanyl in route but reports persistent severe pain.  No alleviating factors.     HPI  Past Medical History:  Diagnosis Date  . Arrhythmia   . Arthritis   . Asthma    as child  . Avascular necrosis of bone of left hip ([place])   . Back pain   . Blood clot in vein 06/2016   x 2 no bleeding disorders found  . Complication of anesthesia    ketamine hallucinations  . Coronary artery disease   . GERD (gastroesophageal reflux disease)   . Hypertension   . Viral pericarditis 20 yrs ago    Patient Active Problem List   Diagnosis Date Noted  . Cough 09/16/2018  . PAF (paroxysmal atrial fibrillation) ([place]) 07/04/2018  . Spontaneous dissection of  coronary artery 06/22/2018  . Ischemic cardiomyopathy 06/22/2018  . Near syncope 06/20/2018  . Tachycardia 06/20/2018  . Nausea 06/20/2018  . PVC (premature ventricular contraction) 06/20/2018  . History of DVT (deep vein thrombosis) 06/20/2018  . NSTEMI (non-ST elevated myocardial infarction) ([place]) 06/20/2018  . Onychomycosis 06/19/2018  . Venous stasis dermatitis of both lower extremities 06/19/2018  . Insomnia 06/19/2018  . Osteoarthritis of right glenohumeral joint 06/17/2018  . Rotator cuff arthropathy of left shoulder 06/17/2018  . History of bilateral hip arthroplasty 03/27/2018  . Avascular necrosis of bone of hip ([place]) 03/27/2018  . Recurrent deep vein thrombosis (DVT) ([place]) 02/12/2018  . Health counseling 02/12/2018  . Hyperhomocystinemia ([place]) 01/10/2018  . Cellulitis of right foot 01/08/2018  . Intermittent claudication ([place]) 12/04/2017  . Fissure in skin 12/04/2017  . Acute deep vein thrombosis (DVT) of calf muscle vein of right lower extremity ([place]) 11/26/2017  . Acute deep vein thrombosis of lower limb ([place]) 11/26/2017  . Primary osteoarthritis of first carpometacarpal joint of left hand 11/21/2017  . Hand cramps 10/25/2017  . High frequency hearing loss, left 09/10/2017  . Degeneration of thoracic intervertebral disc 05/30/2017  . Mixed hyperlipidemia 04/18/2017  . Increased creatine kinase level 04/18/2017  . Scoliosis of thoracolumbar spine 06/23/2016  . Anxiety, generalized 05/11/2016  . Generalized anxiety disorder 05/11/2016  . Benign essential hypertension  03/26/2014  . Spinal stenosis, lumbar region, with neurogenic claudication 12/21/2013  . Morbid obesity ([place]) 08/01/2012  . S/P total knee arthroplasty 01/26/2012  . Erectile dysfunction 01/26/2012  . Annual physical exam 01/26/2012  . Impotence 01/26/2012    Past Surgical History:  Procedure Laterality Date  . ABDOMINAL EXPOSURE N/A 06/23/2016   Procedure: ABDOMINAL EXPOSURE;  Surgeon: Rosetta Posner, MD;  Location: [facility];  Service: Vascular;  Laterality: N/A;  . ANTERIOR LATERAL LUMBAR FUSION 4 LEVELS Right 06/23/2016   Procedure: Right  Lumbar two-three Lumbar three-four Lumbar four-five Anterolateral lumbar interbody fusion;  Surgeon: Erline Levine, MD;  Location: [facility];  Service: Neurosurgery;  Laterality: Right;  . ANTERIOR LUMBAR FUSION N/A 06/23/2016   Procedure: Lumbar five -sacral one  Anterior lumbar interbody fusion with Dr. Sherren Mocha Early for approach;  Surgeon: Erline Levine, MD;  Location: [facility];  Service: Neurosurgery;  Laterality: N/A;  . APPLICATION OF INTRAOPERATIVE CT SCAN N/A 06/26/2016   Procedure: APPLICATION OF INTRAOPERATIVE CT SCAN;  Surgeon: Erline Levine, MD;  Location: [facility];  Service: Neurosurgery;  Laterality: N/A;  . CARDIAC CATHETERIZATION    . CARDIOVASCULAR STRESS TEST     Negative in May 2014  . COLONOSCOPY    . HERNIA REPAIR     umbilical  . IR IVC FILTER PLMT / S&I Burke Keels GUID/MOD SED  03/26/2018  . JOINT REPLACEMENT    . LEFT HEART CATH AND CORONARY ANGIOGRAPHY N/A 06/21/2018   Procedure: LEFT HEART CATH AND CORONARY ANGIOGRAPHY;  Surgeon: Jettie Booze, MD;  Location: [facility];  Service: Cardiovascular;  Laterality: N/A;  . POSTERIOR LUMBAR FUSION 4 LEVEL N/A 06/26/2016   Procedure: Thoracic ten to Pelvis fixation with AIRO;  Surgeon: Erline Levine, MD;  Location: [facility];  Service: Neurosurgery;  Laterality: N/A;  . TENDON REPAIR     right hand  . TOTAL HIP ARTHROPLASTY Right 05/25/2014   Procedure: RIGHT TOTAL HIP ARTHROPLASTY ANTERIOR APPROACH;  Surgeon: Rod Can, MD;  Location: [facility];  Service: Orthopedics;  Laterality: Right;  . TOTAL HIP ARTHROPLASTY Left 03/27/2018   Procedure: TOTAL HIP ARTHROPLASTY ANTERIOR APPROACH- DA complex;  Surgeon: Rod Can, MD;  Location: [facility];  Service: Orthopedics;  Laterality: Left;  . TOTAL KNEE ARTHROPLASTY Bilateral         Home Medications    Prior to Admission medications    Medication Sig Start Date End Date Taking? Authorizing Provider  acetaminophen (TYLENOL) 325 MG tablet Take 650 mg by mouth every 6 (six) hours as needed for mild pain or headache.   Yes [provider]  apixaban (ELIQUIS) 5 MG TABS tablet Take 1 tablet (5 mg total) by mouth 2 (two) times daily. 08/06/18  Yes Camnitz, Will Hassell Done, MD  aspirin 81 MG chewable tablet Chew 81 mg by mouth daily.   Yes [provider]  atorvastatin (LIPITOR) 80 MG tablet Take 1 tablet (80 mg total) by mouth daily at 6 PM. 06/22/18  Yes Mickle Plumb, Jacquelyn D, PA-C  cyclobenzaprine (FLEXERIL) 10 MG tablet Take 10 mg by mouth 3 (three) times daily as needed for muscle spasms.   Yes [provider]  docusate sodium (COLACE) 100 MG capsule Take 1 capsule (100 mg total) by mouth 2 (two) times daily. 03/28/18  Yes Swinteck, Aaron Edelman, MD  DULoxetine (CYMBALTA) 60 MG capsule Take 1 capsule (60 mg total) by mouth at bedtime. 03/28/18  Yes Silverio Decamp, MD  HYDROmorphone (DILAUDID) 4 MG tablet Take 4 mg  by mouth every 4 (four) hours as needed for moderate pain or severe pain.   Yes [provider]  lisinopril-hydrochlorothiazide (ZESTORETIC) 20-25 MG tablet Take 1 tablet by mouth daily. 07/26/18  Yes Silverio Decamp, MD  metoprolol succinate (TOPROL-XL) 100 MG 24 hr tablet Take 1 tablet (100 mg total) by mouth daily. Take with or immediately following a meal. 06/23/18  Yes Visser, Jacquelyn D, PA-C  metoprolol succinate (TOPROL-XL) 50 MG 24 hr tablet Take 1 tablet (50 mg total) by mouth daily. In addition to your 100mg  tablet. Take with or immediately following a meal. 07/10/18 10/08/18 Yes Belva Crome, MD  nitroGLYCERIN (NITROSTAT) 0.4 MG SL tablet Place 1 tablet (0.4 mg total) under the tongue every 5 (five) minutes x 3 doses as needed for chest pain. If taking third dose, call 911. 06/22/18  Yes Mickle Plumb, Jacquelyn D, PA-C  oxyCODONE (OXYCONTIN) 20 mg 12 hr tablet Take 20 mg by mouth 2 (two)  times daily as needed (pain).    Yes [provider]  polyethylene glycol (MIRALAX / GLYCOLAX) 17 g packet Take 17 g by mouth daily as needed for mild constipation.   Yes [provider]  terbinafine (LAMISIL) 250 MG tablet TAKE 1 TABLET (250 MG TOTAL) BY MOUTH DAILY. Patient taking differently: Take 250 mg by mouth daily.  09/02/18  Yes Silverio Decamp, MD  zolpidem (AMBIEN) 5 MG tablet Take 1 tablet (5 mg total) by mouth at bedtime as needed for sleep. 06/19/18  Yes Silverio Decamp, MD  albuterol (VENTOLIN HFA) 108 (90 Base) MCG/ACT inhaler Inhale 2 puffs into the lungs every 6 (six) hours as needed for wheezing. Patient not taking: Reported on 10/03/2018 09/16/18   Silverio Decamp, MD  azithromycin (ZITHROMAX Z-PAK) 250 MG tablet Take 2 tablets (500 mg) on  Day 1,  followed by 1 tablet (250 mg) once daily on Days 2 through 5. Patient not taking: Reported on 10/03/2018 09/16/18   Silverio Decamp, MD  doxycycline (VIBRA-TABS) 100 MG tablet Take 1 tablet (100 mg total) by mouth 2 (two) times daily. Patient not taking: Reported on 10/03/2018 09/26/18   Trula Slade, DPM  mupirocin ointment (BACTROBAN) 2 % Apply 1 application topically 2 (two) times daily. Patient not taking: Reported on 10/03/2018 09/26/18   Trula Slade, DPM    Family History Family History  Adopted: Yes  Problem Relation Age of Onset  . Healthy Daughter   . Healthy Daughter     Social History Social History   Tobacco Use  . Smoking status: Former Smoker    Packs/day: 1.00    Years: 25.00    Pack years: 25.00    Types: Cigarettes    Quit date: 2000    Years since quitting: 20.7  . Smokeless tobacco: Current User    Types: Snuff  Substance Use Topics  . Alcohol use: Yes    Alcohol/week: 14.0 standard drinks    Types: 14 Cans of beer per week    Comment: 2 beers QD  . Drug use: No     Allergies   Ketamine and Morphine and related   Review of Systems  Review of Systems  Musculoskeletal: Positive for arthralgias, back pain and gait problem.  Hematological: Bruises/bleeds easily.  All other systems reviewed and are negative.    Physical Exam Updated Vital Signs BP 112/85   Pulse 65   Temp 98.3 F (36.8 C) (Oral)   Resp 11   Ht 6' (1.829  m)   Wt (!) 146.5 kg   SpO2 100%   BMI 43.80 kg/m   Physical Exam Vitals signs and nursing note reviewed.  Constitutional:      General: He is not in acute distress.    Appearance: He is well-developed.     Comments: NAD.  HENT:     Head: Normocephalic and atraumatic.     Right Ear: External ear normal.     Left Ear: External ear normal.     Nose: Nose normal.  Eyes:     General: No scleral icterus.    Conjunctiva/sclera: Conjunctivae normal.  Neck:     Musculoskeletal: Normal range of motion and neck supple.  Cardiovascular:     Rate and Rhythm: Normal rate and regular rhythm.     Heart sounds: Normal heart sounds. No murmur.     Comments: 1+ DP pulses bilaterally  Pulmonary:     Effort: Pulmonary effort is normal.     Breath sounds: Normal breath sounds. No wheezing.  Musculoskeletal: Normal range of motion.        General: Deformity present.     Comments: Left leg is shortened. No reproducible tenderness to anterior/lateral left hip  Skin:    General: Skin is warm and dry.     Capillary Refill: Capillary refill takes less than 2 seconds.  Neurological:     Mental Status: He is alert and oriented to person, place, and time.     Comments: Sensation to light touch intact in lower extremities. Strength with ankle flexion/extension intact bilaterally   Psychiatric:        Behavior: Behavior normal.        Thought Content: Thought content normal.        Judgment: Judgment normal.      ED Treatments / Results  Labs (all labs ordered are listed, but only abnormal results are displayed) Labs Reviewed  SARS CORONAVIRUS 2 (HOSPITAL ORDER, [facility])     EKG None  Radiology Dg Hip Unilat W Or Wo Pelvis 2-3 Views Left  Result Date: 10/03/2018 CLINICAL DATA:  Pt sitting in chair at home, twisted and felt lt hip pop out, severe pain EXAM: DG HIP (WITH OR WITHOUT PELVIS) 2-3V LEFT COMPARISON:  None. FINDINGS: Left hip prosthesis is dislocated, femoral component lying above the acetabular component. No fracture. No evidence of loosening the orthopedic hardware. IMPRESSION: Dislocated left hip prosthesis. Electronically Signed   By: Lajean Manes M.D.   On: 10/03/2018 12:43    Procedures Procedures (including critical care time)  Medications Ordered in ED Medications  fentaNYL (SUBLIMAZE) injection 100 mcg (100 mcg Intravenous Given 10/03/18 1316)  fentaNYL (SUBLIMAZE) injection 100 mcg (100 mcg Intravenous Given 10/03/18 1627)     Initial Impression / Assessment and Plan / ED Course  I have reviewed the triage vital signs and the nursing notes.  Pertinent labs & imaging results that were available during my care of the patient were reviewed by me and considered in my medical decision making (see chart for details).  Clinical Course as of Oct 02 1698  Thu Oct 03, 2018  1301 Left hip prosthesis is dislocated, femoral component lying above the acetabular component. No fracture. No evidence of loosening the orthopedic hardware.    DG Hip Unilat W or Wo Pelvis 2-3 Views Left [CG]    Clinical Course User Index [CG] Kinnie Feil, PA-C   Xray reviewed by me and radiology. Left hip prothesis is posteriorly  dislocated.    EMR reviewed. Patient has several medical risk factors for complications of sedation in ER including obesity, ?coronary artery dissection, multiple PE on eliquis, PAF, combined HF EF 40%, ischemic CM.  His hip has been dislocated for 18 hours. Discussed with EDP. Considered high risk would benefit from OR reduction.  Pending ortho consult.   Spoke to Dr Lyla Glassing who will take patient to OR for reduction.   21:  Pt and wife upset because they want to see Dr Lyla Glassing before surgery.  They request we call him back. Ortho repaged.  1700: RN notified me patient went to OR.   Final Clinical Impressions(s) / ED Diagnoses   Final diagnoses:  Dislocation of hip joint prosthesis, initial encounter [facility])    ED Discharge Orders    None       Kinnie Feil, PA-C 10/03/18 1514    Kinnie Feil, PA-C 10/03/18 1700    Quintella Reichert, MD 10/08/18 1002

## 2018-10-03 NOTE — ED Triage Notes (Signed)
Patient arrived by EMS from home. PT c/o LFT sided hip pain. Pt was getting out of chair when extreme pain began.   Pt had LFT sided hip replacement March 2020.   EMS gave 200 mcg of Fentanyl.

## 2018-10-03 NOTE — Consult Note (Signed)
ORTHOPAEDIC CONSULTATION  REQUESTING PHYSICIAN: Quintella Reichert, MD  PCP:  Silverio Decamp, MD  Chief Complaint: Left hip pain  HPI: Travis Huff is a 58 y.o. male who complains of left hip pain after he sustained a dislocation yesterday while sitting at the table.  He had left hip pain and inability to weight-bear.  He came to the emergency department today due to continued left hip pain and inability to weight-bear.  X-rays showed a posterior dislocation of his left total hip replacement.  He is known to me for previous staged bilateral total hip replacement, with the left side being done on 03/27/2018.  He had avascular necrosis with collapse of nearly the entire femoral head and superior dislocation of the hip.  Orthopedic consultation was placed by the emergency department staff.  They felt the patient was too high risk for sedation and closed reduction in the emergency department.  Past Medical History:  Diagnosis Date  . Arrhythmia   . Arthritis   . Asthma    as child  . Avascular necrosis of bone of left hip ([place])   . Back pain   . Blood clot in vein 06/2016   x 2 no bleeding disorders found  . Complication of anesthesia    ketamine hallucinations  . Coronary artery disease   . GERD (gastroesophageal reflux disease)   . Hypertension   . Viral pericarditis 20 yrs ago   Past Surgical History:  Procedure Laterality Date  . ABDOMINAL EXPOSURE N/A 06/23/2016   Procedure: ABDOMINAL EXPOSURE;  Surgeon: Rosetta Posner, MD;  Location: [place];  Service: Vascular;  Laterality: N/A;  . ANTERIOR LATERAL LUMBAR FUSION 4 LEVELS Right 06/23/2016   Procedure: Right  Lumbar two-three Lumbar three-four Lumbar four-five Anterolateral lumbar interbody fusion;  Surgeon: Erline Levine, MD;  Location: [place];  Service: Neurosurgery;  Laterality: Right;  . ANTERIOR LUMBAR FUSION N/A 06/23/2016   Procedure: Lumbar five -sacral one  Anterior lumbar interbody fusion with Dr. Sherren Mocha Early for  approach;  Surgeon: Erline Levine, MD;  Location: [facility];  Service: Neurosurgery;  Laterality: N/A;  . APPLICATION OF INTRAOPERATIVE CT SCAN N/A 06/26/2016   Procedure: APPLICATION OF INTRAOPERATIVE CT SCAN;  Surgeon: Erline Levine, MD;  Location: [place];  Service: Neurosurgery;  Laterality: N/A;  . CARDIAC CATHETERIZATION    . CARDIOVASCULAR STRESS TEST     Negative in May 2014  . COLONOSCOPY    . HERNIA REPAIR     umbilical  . IR IVC FILTER PLMT / S&I Burke Keels GUID/MOD SED  03/26/2018  . JOINT REPLACEMENT    . LEFT HEART CATH AND CORONARY ANGIOGRAPHY N/A 06/21/2018   Procedure: LEFT HEART CATH AND CORONARY ANGIOGRAPHY;  Surgeon: Jettie Booze, MD;  Location: [facility];  Service: Cardiovascular;  Laterality: N/A;  . POSTERIOR LUMBAR FUSION 4 LEVEL N/A 06/26/2016   Procedure: Thoracic ten to Pelvis fixation with AIRO;  Surgeon: Erline Levine, MD;  Location: [facility];  Service: Neurosurgery;  Laterality: N/A;  . TENDON REPAIR     right hand  . TOTAL HIP ARTHROPLASTY Right 05/25/2014   Procedure: RIGHT TOTAL HIP ARTHROPLASTY ANTERIOR APPROACH;  Surgeon: Rod Can, MD;  Location: [facility] [facility];  Service: Orthopedics;  Laterality: Right;  . TOTAL HIP ARTHROPLASTY Left 03/27/2018   Procedure: TOTAL HIP ARTHROPLASTY ANTERIOR APPROACH- DA complex;  Surgeon: Rod Can, MD;  Location[facility];  Service: Orthopedics;  Laterality: Left;  . TOTAL KNEE ARTHROPLASTY Bilateral  Social History   Socioeconomic History  . Marital status: Married    Spouse name: [name]  . Number of children: 2  . Years of education: 54  . Highest education level: Not on file  Occupational History  . Occupation: Public relations account executive  Social Needs  . Financial resource strain: Not on file  . Food insecurity    Worry: Never true    Inability: Never true  . Transportation needs    Medical: No    Non-medical: No  Tobacco Use  . Smoking status: Former Smoker    Packs/day: 1.00    Years: 25.00    Pack years: 25.00     Types: Cigarettes    Quit date: 2000    Years since quitting: 20.7  . Smokeless tobacco: Current User    Types: Snuff  Substance and Sexual Activity  . Alcohol use: Yes    Alcohol/week: 14.0 standard drinks    Types: 14 Cans of beer per week    Comment: 2 beers QD  . Drug use: No  . Sexual activity: Not on file  Lifestyle  . Physical activity    Days per week: 0 days    Minutes per session: 0 min  . Stress: To some extent  Relationships  . Social Herbalist on phone: Not on file    Gets together: Not on file    Attends religious service: Not on file    Active member of club or organization: Not on file    Attends meetings of clubs or organizations: Not on file    Relationship status: Not on file  Other Topics Concern  . Not on file  Social History Narrative   Lives with wife in a one story home.  Has 2 children.  Works as a Public relations account executive.  Education: high school.    Family History  Adopted: Yes  Problem Relation Age of Onset  . Healthy Daughter   . Healthy Daughter    Allergies  Allergen Reactions  . Ketamine     hallucinations  . Morphine And Related Itching   Prior to Admission medications   Medication Sig Start Date End Date Taking? Authorizing Provider  acetaminophen (TYLENOL) 325 MG tablet Take 650 mg by mouth every 6 (six) hours as needed for mild pain or headache.   Yes [provider]  apixaban (ELIQUIS) 5 MG TABS tablet Take 1 tablet (5 mg total) by mouth 2 (two) times daily. 08/06/18  Yes Camnitz, Will Hassell Done, MD  aspirin 81 MG chewable tablet Chew 81 mg by mouth daily.   Yes [provider]  atorvastatin (LIPITOR) 80 MG tablet Take 1 tablet (80 mg total) by mouth daily at 6 PM. 06/22/18  Yes Mickle Plumb, Jacquelyn D, PA-C  cyclobenzaprine (FLEXERIL) 10 MG tablet Take 10 mg by mouth 3 (three) times daily as needed for muscle spasms.   Yes [provider]  docusate sodium (COLACE) 100 MG capsule Take 1 capsule (100 mg total)  by mouth 2 (two) times daily. 03/28/18  Yes Sye Schroepfer, Aaron Edelman, MD  DULoxetine (CYMBALTA) 60 MG capsule Take 1 capsule (60 mg total) by mouth at bedtime. 03/28/18  Yes Silverio Decamp, MD  HYDROmorphone (DILAUDID) 4 MG tablet Take 4 mg by mouth every 4 (four) hours as needed for moderate pain or severe pain.   Yes [provider]  lisinopril-hydrochlorothiazide (ZESTORETIC) 20-25 MG tablet Take 1 tablet by mouth daily. 07/26/18  Yes Silverio Decamp, MD  metoprolol succinate (  TOPROL-XL) 100 MG 24 hr tablet Take 1 tablet (100 mg total) by mouth daily. Take with or immediately following a meal. 06/23/18  Yes Visser, Jacquelyn D, PA-C  metoprolol succinate (TOPROL-XL) 50 MG 24 hr tablet Take 1 tablet (50 mg total) by mouth daily. In addition to your 100mg  tablet. Take with or immediately following a meal. 07/10/18 10/08/18 Yes Belva Crome, MD  nitroGLYCERIN (NITROSTAT) 0.4 MG SL tablet Place 1 tablet (0.4 mg total) under the tongue every 5 (five) minutes x 3 doses as needed for chest pain. If taking third dose, call 911. 06/22/18  Yes Mickle Plumb, Jacquelyn D, PA-C  oxyCODONE (OXYCONTIN) 20 mg 12 hr tablet Take 20 mg by mouth 2 (two) times daily as needed (pain).    Yes [provider]  polyethylene glycol (MIRALAX / GLYCOLAX) 17 g packet Take 17 g by mouth daily as needed for mild constipation.   Yes [provider]  terbinafine (LAMISIL) 250 MG tablet TAKE 1 TABLET (250 MG TOTAL) BY MOUTH DAILY. Patient taking differently: Take 250 mg by mouth daily.  09/02/18  Yes Silverio Decamp, MD  zolpidem (AMBIEN) 5 MG tablet Take 1 tablet (5 mg total) by mouth at bedtime as needed for sleep. 06/19/18  Yes Silverio Decamp, MD  albuterol (VENTOLIN HFA) 108 (90 Base) MCG/ACT inhaler Inhale 2 puffs into the lungs every 6 (six) hours as needed for wheezing. Patient not taking: Reported on 10/03/2018 09/16/18   Silverio Decamp, MD  azithromycin (ZITHROMAX Z-PAK) 250 MG tablet  Take 2 tablets (500 mg) on  Day 1,  followed by 1 tablet (250 mg) once daily on Days 2 through 5. Patient not taking: Reported on 10/03/2018 09/16/18   Silverio Decamp, MD  doxycycline (VIBRA-TABS) 100 MG tablet Take 1 tablet (100 mg total) by mouth 2 (two) times daily. Patient not taking: Reported on 10/03/2018 09/26/18   Trula Slade, DPM  mupirocin ointment (BACTROBAN) 2 % Apply 1 application topically 2 (two) times daily. Patient not taking: Reported on 10/03/2018 09/26/18   Trula Slade, DPM   Dg Hip Unilat W Or Wo Pelvis 2-3 Views Left  Result Date: 10/03/2018 CLINICAL DATA:  Pt sitting in chair at home, twisted and felt lt hip pop out, severe pain EXAM: DG HIP (WITH OR WITHOUT PELVIS) 2-3V LEFT COMPARISON:  None. FINDINGS: Left hip prosthesis is dislocated, femoral component lying above the acetabular component. No fracture. No evidence of loosening the orthopedic hardware. IMPRESSION: Dislocated left hip prosthesis. Electronically Signed   By: Lajean Manes M.D.   On: 10/03/2018 12:43    Positive ROS: All other systems have been reviewed and were otherwise negative with the exception of those mentioned in the HPI and as above.  Physical Exam: General: Alert, no acute distress Cardiovascular: No pedal edema Respiratory: No cyanosis, no use of accessory musculature GI: No organomegaly, abdomen is soft and non-tender Skin: No lesions in the area of chief complaint Neurologic: Sensation intact distally Psychiatric: Patient is competent for consent with normal mood and affect Lymphatic: No axillary or cervical lymphadenopathy  MUSCULOSKELETAL: Examination of the left hip reveals a healed anterior incision.  He is shortened and rotated.  Neurovascularly intact.  Assessment: First episode dislocation left total hip arthroplasty Eliquis usage. Multiple medical problems  Plan: I discussed the findings with the patient and his wife.  They are very upset over the ED course  today, and voiced their frustrations over several different issues.  We discussed that  after 6 months, having a first episode dislocation is unusual.  We reviewed that his component position on x-ray is excellent.  I am not sure how his hip could have dislocated while simply seated at a table.  We discussed close reduction in the emergency department.  His hip is been out for over 18 hours.  If closed reduction is not possible, then he will need to be admitted to the hospital.  We will have to wait the appropriate amount of time due to his Eliquis usage, and then proceed with an open reduction.  If close reduction is successful, then I would plan admit him for overnight observation and obtain a hip abduction brace.  All questions were solicited and answered.    Bertram Savin, MD Cell [phone]    10/03/2018 4:52 PM

## 2018-10-04 ENCOUNTER — Ambulatory Visit (HOSPITAL_COMMUNITY): Payer: 59

## 2018-10-04 ENCOUNTER — Encounter (HOSPITAL_COMMUNITY): Payer: Self-pay | Admitting: Orthopedic Surgery

## 2018-10-04 ENCOUNTER — Ambulatory Visit: Payer: 59 | Admitting: Interventional Cardiology

## 2018-10-04 MED ORDER — INFLUENZA VAC SPLIT QUAD 0.5 ML IM SUSY: 0.5000 mL | PREFILLED_SYRINGE | INTRAMUSCULAR | Status: DC

## 2018-10-04 NOTE — Evaluation (Signed)
Occupational Therapy Evaluation Patient Details Name: Travis Huff MRN: [id number] DOB: [birth year] Today's Date: 10/04/2018    History of Present Illness 58 year old man admitted for dislocation of L hip; s/p closed reduction.  PMH:  PEs, CAD, arrhythmias, and HTN   Clinical Impression   Pt was admitted for the above.  He has not had posterior THPS in the past. Reviewed these and ADL adaptations. Pt has a Secondary school teacher and long shoehorn at home. He usually wears slip on shoes without socks. He may need toilet aide as he used to lean forward in sitting for hygiene. He has a 3:1 from relative at home, but may need wide model due to hip abduction brace.      Follow Up Recommendations  Supervision/Assistance - 24 hour    Equipment Recommendations  3 in 1 bedside commode(wide; due to abduction brace)    Recommendations for Other Services       Precautions / Restrictions Precautions Precautions: Posterior Hip Precaution Comments: hip abductor brace at all times Restrictions Weight Bearing Restrictions: No Other Position/Activity Restrictions: WBAT      Mobility Bed Mobility Overal bed mobility: Needs Assistance Bed Mobility: Supine to Sit     Supine to sit: Min assist     General bed mobility comments: use of rail and partial roll  Transfers Overall transfer level: Needs assistance Equipment used: Rolling walker (2 wheeled) Transfers: Sit to/from Stand Sit to Stand: Min guard         General transfer comment: cues for UE and LE placement    Balance                                           ADL either performed or assessed with clinical judgement   ADL Overall ADL's : Needs assistance/impaired Eating/Feeding: Independent   Grooming: Set up   Upper Body Bathing: Set up   Lower Body Bathing: Maximal assistance   Upper Body Dressing : Set up   Lower Body Dressing: Maximal assistance   Toilet Transfer: Min guard;Ambulation;BSC;RW    Toileting- Water quality scientist and Hygiene: Min guard;Sit to/from stand         General ADL Comments: ambulated to bathroom and practiced toilet transfer with 3:1 over toilet. He has one of these at home; may need wider one due to hip abductor brace.  Educated on AE including toilet aide.  Educated on posterior THPs. Pt states that he has back issues; toileting will be difficult for him     Vision         Perception     Praxis      Pertinent Vitals/Pain Pain Assessment: Faces Faces Pain Scale: Hurts a little bit Pain Intervention(s): Limited activity within patient's tolerance;Monitored during session;Premedicated before session;Repositioned     Hand Dominance Right   Extremity/Trunk Assessment Upper Extremity Assessment Upper Extremity Assessment: Overall WFL for tasks assessed           Communication Communication Communication: No difficulties   Cognition Arousal/Alertness: Awake/alert Behavior During Therapy: WFL for tasks assessed/performed Overall Cognitive Status: Within Functional Limits for tasks assessed                                     General Comments       Exercises     Shoulder  Instructions      Home Living Family/patient expects to be discharged to:: Private residence Living Arrangements: Spouse/significant other Available Help at Discharge: Family;Available PRN/intermittently               Bathroom Shower/Tub: Walk-in shower   Bathroom Toilet: Handicapped height     Home Equipment: Environmental consultant - 2 wheels;Bedside commode          Prior Functioning/Environment Level of Independence: Independent with assistive device(s)        Comments: Pt's wife is an Therapist, sports        OT Problem List:        OT Treatment/Interventions:      OT Goals(Current goals can be found in the care plan section) Acute Rehab OT Goals OT Goal Formulation: All assessment and education complete, DC therapy  OT Frequency:     Barriers to  D/C:            Co-evaluation              AM-PAC OT "6 Clicks" Daily Activity     Outcome Measure Help from another person eating meals?: None Help from another person taking care of personal grooming?: A Little Help from another person toileting, which includes using toliet, bedpan, or urinal?: A Little Help from another person bathing (including washing, rinsing, drying)?: A Lot Help from another person to put on and taking off regular upper body clothing?: A Little Help from another person to put on and taking off regular lower body clothing?: A Lot 6 Click Score: 17   End of Session    Activity Tolerance: Patient tolerated treatment well Patient left: in chair;with call bell/phone within reach  OT Visit Diagnosis: Pain Pain - Right/Left: Left Pain - part of body: Hip                Time: [id number] OT Time Calculation (min): 36 min Charges:  OT General Charges $OT Visit: 1 Visit OT Evaluation $OT Eval Low Complexity: 1 Low OT Treatments $Self Care/Home Management : 8-22 mins  Travis Huff, OTR/L Acute Rehabilitation Services [phone] [facility] pager [phone] office 10/04/2018  Travis Huff 10/04/2018, 11:57 AM

## 2018-10-04 NOTE — Progress Notes (Signed)
Occupational Therapy Treatment Patient Details Name: Travis Huff MRN: [id number] DOB: 01/02/1961 Today's Date: 10/04/2018    History of present illness 58 year old man admitted for dislocation of L hip; s/p closed reduction.  PMH:  PEs, CAD, arrhythmias, and HTN   OT comments  Pt seen for family education; completed and no further OT needs identified   Follow Up Recommendations  Supervision/Assistance - 24 hour    Equipment Recommendations  (pt does not want wide 3:1 commode)    Recommendations for Other Services      Precautions / Restrictions Precautions Precautions: Posterior Hip Precaution Comments: hip abductor brace at all times Restrictions Other Position/Activity Restrictions: WBAT       Mobility Bed Mobility         Supine to sit: Min assist     General bed mobility comments: practiced left and right side  Transfers Overall transfer level: Needs assistance Equipment used: Rolling walker (2 wheeled) Transfers: Sit to/from Stand Sit to Stand: Min guard         General transfer comment: cues for UE and LE placement    Balance                                           ADL either performed or assessed with clinical judgement   ADL                                         General ADL Comments: I had originally stopped up to see if pt wanted a wider 3:1 commode for over toilet due to space constraints with brace. Pt states that it would not fit in his bathroom, and he did not wanto to use it as a  BSC.  He states that if [facility] doesn't work he will be able to get up from commode while following precautions.  Educated that he should not sit on a surface lower than the crease of his knee.  He is 6' tall.  Wife present and reviewed precautions with her as well as she wanted to see him stand and practice bed mobility.  Pt has limited space on his side.  Practiced from both left and right sides of the bed with min guard to min  A.  Min guard to stand.  Wife will assist with ADLs.  PT came up to help adjust abd brace; as it had moved slightly. Showed wife how to adjust straps as needed     Vision       Perception     Praxis      Cognition Arousal/Alertness: Awake/alert Behavior During Therapy: WFL for tasks assessed/performed Overall Cognitive Status: Within Functional Limits for tasks assessed                                          Exercises     Shoulder Instructions       General Comments      Pertinent Vitals/ Pain       Pain Assessment: No/denies pain Faces Pain Scale: Hurts little more(back, L hip) Pain Intervention(s): Limited activity within patient's tolerance;Monitored during session;Repositioned  Home Living Family/patient expects to be discharged to:: Private residence  Living Arrangements: Spouse/significant other Available Help at Discharge: Family;Available PRN/intermittently Type of Home: House Home Access: Stairs to enter [employer] of Steps: 4 Entrance Stairs-Rails: Right;Left;Can reach both Home Layout: One level     Bathroom Shower/Tub: Occupational psychologist: Handicapped height     Home Equipment: Environmental consultant - 2 wheels;Bedside commode          Prior Functioning/Environment Level of Independence: Independent with assistive device(s)        Comments: Pt's wife is an Therapist, sports   Frequency           Progress Toward Goals  OT Goals(current goals can now be found in the care plan section)  Progress towards OT goals: (goals not set intially: Add:  Family will verbalize understanding of precautions, assistance needed for mobility and adls--met )     Plan      Co-evaluation                 AM-PAC OT "6 Clicks" Daily Activity     Outcome Measure   Help from another person eating meals?: None Help from another person taking care of personal grooming?: A Little Help from another person toileting, which includes using  toliet, bedpan, or urinal?: A Little Help from another person bathing (including washing, rinsing, drying)?: A Lot Help from another person to put on and taking off regular upper body clothing?: A Little Help from another person to put on and taking off regular lower body clothing?: A Lot 6 Click Score: 17    End of Session    OT Visit Diagnosis: Pain Pain - Right/Left: Left Pain - part of body: Hip   Activity Tolerance Patient tolerated treatment well   Patient Left in chair;with call bell/phone within reach   Nurse Communication          Time: 8309-4076 OT Time Calculation (min): 23 min  Charges: OT General Charges $OT Visit: 1 Visit OT Treatments $Therapeutic Activity: 8-22 mins  Lesle Chris, OTR/L Acute Rehabilitation Services [phone] [facility] pager [phone] office 10/04/2018   Murphy Bundick 10/04/2018, 3:04 PM

## 2018-10-04 NOTE — TOC Transition Note (Signed)
Transition of Care [facility]) - CM/SW Discharge Note   Patient Details  Name: DAISEAN ALMASRI MRN: [id number] Date of Birth: [birth year]  [facility] CM/SW Contact:  Dessa Phi, RN Phone Number: 10/04/2018, 1:18 PM   Clinical Narrative:No orders. No CM needs.             Patient Goals and CMS Choice        Discharge Placement                       Discharge Plan and Services                                     Social Determinants of Health (SDOH) Interventions     Readmission Risk Interventions No flowsheet data found.

## 2018-10-04 NOTE — Progress Notes (Signed)
    Subjective:  Patient reports pain as mild.  Denies N/V/CP/SOB. Hip feels better. Abduction brace delivered this am.  Objective:   VITALS:   Vitals:   10/03/18 2238 10/04/18 0020 10/04/18 0445 10/04/18 0848  BP: 128/77 122/81 127/85 122/86  Pulse: 74 80 75 80  Resp: 16 16 18    Temp: 98.9 F (37.2 C) 98.6 F (37 C) 98.1 F (36.7 C)   TempSrc:  Oral    SpO2: 100% 100% 97%   Weight:      Height:        NAD ABD soft Sensation intact distally Intact pulses distally Dorsiflexion/Plantar flexion intact Abduction brace intact   Lab Results  Component Value Date   WBC 9.1 06/22/2018   HGB 14.0 06/22/2018   HCT 43.6 06/22/2018   MCV 86.9 06/22/2018   PLT 300 06/22/2018   BMET    Component Value Date/Time   NA 138 06/21/2018 0532   K 4.6 06/21/2018 0532   CL 99 06/21/2018 0532   CO2 28 06/21/2018 0532   GLUCOSE 115 (H) 06/21/2018 0532   BUN 13 06/21/2018 0532   CREATININE 0.99 06/21/2018 0532   CREATININE 0.97 06/19/2018 1109   CALCIUM 9.6 06/21/2018 0532   GFRNONAA >60 06/21/2018 0532   GFRNONAA 86 06/19/2018 1109   GFRAA >60 06/21/2018 0532   GFRAA 100 06/19/2018 1109     Assessment/Plan: 1 Day Post-Op   Active Problems:   Closed dislocation of left hip (HCC)   WBAT with walker DVT ppx: Aspirin, SCDs, TEDS PO pain control PT/OT Dispo: D/C home   Hilton Cork Danetta Prom 10/04/2018, 10:39 AM   Rod Can, MD Cell: [phone] [place] [facility]  [place] [address], [address], [place], [place] [address] Phone: [phone] [email or web address] Facebook  [employer]

## 2018-10-04 NOTE — Discharge Summary (Signed)
Physician Discharge Summary  Patient ID: Travis Huff MRN: [id number] DOB/AGE: [birth year] 58 y.o.  Admit date: 10/03/2018 Discharge date: 10/04/2018  Admission Diagnoses:  <principal problem not specified>  Discharge Diagnoses:  Active Problems:   Closed dislocation of left hip [facility])   Past Medical History:  Diagnosis Date  . Arrhythmia   . Arthritis   . Asthma    as child  . Avascular necrosis of bone of left hip ([place])   . Back pain   . Blood clot in vein 06/2016   x 2 no bleeding disorders found  . Complication of anesthesia    ketamine hallucinations  . Coronary artery disease   . GERD (gastroesophageal reflux disease)   . Hypertension   . Viral pericarditis 20 yrs ago    Surgeries: Procedure(s): CLOSED REDUCTION HIP on 10/03/2018   Consultants (if any):   Discharged Condition: Improved  Hospital Course: Travis Huff is an 58 y.o. male who was admitted 10/03/2018 with a diagnosis of <principal problem not specified> and went to the operating room on 10/03/2018 and underwent the above named procedures.    He was given perioperative antibiotics:  Anti-infectives (From admission, onward)   Start     Dose/Rate Route Frequency Ordered Stop   10/04/18 1000  terbinafine (LAMISIL) tablet 250 mg     250 mg Oral Daily 10/03/18 1925      .  He was given sequential compression devices, early ambulation, and ASA for DVT prophylaxis.  Hip abduction brace delivered.   He benefited maximally from the hospital stay and there were no complications.    Recent vital signs:  Vitals:   10/04/18 0445 10/04/18 0848  BP: 127/85 122/86  Pulse: 75 80  Resp: 18   Temp: 98.1 F (36.7 C)   SpO2: 97%     Recent laboratory studies:  Lab Results  Component Value Date   HGB 14.0 06/22/2018   HGB 13.6 06/21/2018   HGB 14.1 06/20/2018   Lab Results  Component Value Date   WBC 9.1 06/22/2018   PLT 300 06/22/2018   Lab Results  Component Value Date   INR 1.0 03/26/2018    Lab Results  Component Value Date   NA 138 06/21/2018   K 4.6 06/21/2018   CL 99 06/21/2018   CO2 28 06/21/2018   BUN 13 06/21/2018   CREATININE 0.99 06/21/2018   GLUCOSE 115 (H) 06/21/2018    Discharge Medications:   Allergies as of 10/04/2018      Reactions   Ketamine    hallucinations   Morphine And Related Itching      Medication List    STOP taking these medications   albuterol 108 (90 Base) MCG/ACT inhaler Commonly known as: VENTOLIN HFA   azithromycin 250 MG tablet Commonly known as: Zithromax Z-Pak   doxycycline 100 MG tablet Commonly known as: VIBRA-TABS   mupirocin ointment 2 % Commonly known as: BACTROBAN     TAKE these medications   acetaminophen 325 MG tablet Commonly known as: TYLENOL Take 650 mg by mouth every 6 (six) hours as needed for mild pain or headache.   apixaban 5 MG Tabs tablet Commonly known as: ELIQUIS Take 1 tablet (5 mg total) by mouth 2 (two) times daily.   aspirin 81 MG chewable tablet Chew 81 mg by mouth daily.   atorvastatin 80 MG tablet Commonly known as: LIPITOR Take 1 tablet (80 mg total) by mouth daily at 6 PM.   cyclobenzaprine 10 MG tablet  Commonly known as: FLEXERIL Take 10 mg by mouth 3 (three) times daily as needed for muscle spasms.   docusate sodium 100 MG capsule Commonly known as: COLACE Take 1 capsule (100 mg total) by mouth 2 (two) times daily.   DULoxetine 60 MG capsule Commonly known as: CYMBALTA Take 1 capsule (60 mg total) by mouth at bedtime.   HYDROmorphone 4 MG tablet Commonly known as: DILAUDID Take 4 mg by mouth every 4 (four) hours as needed for moderate pain or severe pain.   lisinopril-hydrochlorothiazide 20-25 MG tablet Commonly known as: ZESTORETIC Take 1 tablet by mouth daily.   metoprolol succinate 100 MG 24 hr tablet Commonly known as: TOPROL-XL Take 1 tablet (100 mg total) by mouth daily. Take with or immediately following a meal.   metoprolol succinate 50 MG 24 hr  tablet Commonly known as: TOPROL-XL Take 1 tablet (50 mg total) by mouth daily. In addition to your 100mg  tablet. Take with or immediately following a meal.   nitroGLYCERIN 0.4 MG SL tablet Commonly known as: NITROSTAT Place 1 tablet (0.4 mg total) under the tongue every 5 (five) minutes x 3 doses as needed for chest pain. If taking third dose, call 911.   oxyCODONE 20 mg 12 hr tablet Commonly known as: OXYCONTIN Take 20 mg by mouth 2 (two) times daily as needed (pain).   polyethylene glycol 17 g packet Commonly known as: MIRALAX / GLYCOLAX Take 17 g by mouth daily as needed for mild constipation.   terbinafine 250 MG tablet Commonly known as: LAMISIL TAKE 1 TABLET (250 MG TOTAL) BY MOUTH DAILY. What changed: See the new instructions.   zolpidem 5 MG tablet Commonly known as: AMBIEN Take 1 tablet (5 mg total) by mouth at bedtime as needed for sleep.       Diagnostic Studies: Dg Chest 2 View  Result Date: 09/17/2018 CLINICAL DATA:  58 year old male with cough. EXAM: CHEST - 2 VIEW COMPARISON:  Chest radiograph dated 06/20/2018 FINDINGS: Bibasilar dependent atelectatic changes. No focal consolidation, pleural effusion, or pneumothorax. Mild cardiomegaly. No acute osseous pathology. Partially visualized posterior lower thoracic and lumbar fixation hardware. IMPRESSION: No active cardiopulmonary disease. Electronically Signed   By: Anner Crete M.D.   On: 09/17/2018 08:40   Dg Hip Unilat With Pelvis 1v Left  Result Date: 10/03/2018 CLINICAL DATA:  Follow-up examination status post intraoperative closed reduction of left hip dislocation. EXAM: DG HIP (WITH OR WITHOUT PELVIS) 1V*L* COMPARISON:  Prior radiograph from earlier same day. FINDINGS: Previously seen dislocated left hip prosthesis now in normal anatomic alignment status post reduction. No visible fracture or hardware complication. Extensive postoperative changes noted within the visualized lumbosacral spine. IMPRESSION:  Successful reduction of previously seen dislocated left hip prosthesis. No visible fracture or hardware complication. Electronically Signed   By: Jeannine Boga M.D.   On: 10/03/2018 18:41   Dg Hip Unilat W Or Wo Pelvis 2-3 Views Left  Result Date: 10/03/2018 CLINICAL DATA:  Pt sitting in chair at home, twisted and felt lt hip pop out, severe pain EXAM: DG HIP (WITH OR WITHOUT PELVIS) 2-3V LEFT COMPARISON:  None. FINDINGS: Left hip prosthesis is dislocated, femoral component lying above the acetabular component. No fracture. No evidence of loosening the orthopedic hardware. IMPRESSION: Dislocated left hip prosthesis. Electronically Signed   By: Lajean Manes M.D.   On: 10/03/2018 12:43    Disposition: Discharge disposition: 01-Home or Self Care       Discharge Instructions    Call MD / Call  911   Complete by: As directed    If you experience chest pain or shortness of breath, CALL 911 and be transported to the hospital emergency room.  If you develope a fever above 101 F, pus (white drainage) or increased drainage or redness at the wound, or calf pain, call your surgeon's office.   Constipation Prevention   Complete by: As directed    Drink plenty of fluids.  Prune juice may be helpful.  You may use a stool softener, such as Colace (over the counter) 100 mg twice a day.  Use MiraLax (over the counter) for constipation as needed.   Diet - low sodium heart healthy   Complete by: As directed    Discharge instructions   Complete by: As directed    Posterior hip precautions Wear abduction brace at all times   Driving restrictions   Complete by: As directed    No driving for 6 weeks   Increase activity slowly as tolerated   Complete by: As directed    Lifting restrictions   Complete by: As directed    No lifting for 6 weeks      Follow-up Information    [place] Fant, Aaron Edelman, MD. Schedule an appointment as soon as possible for a visit in 2 weeks.   Specialty: Orthopedic  Surgery Why: hip x-rays Contact information: [address] [place] [address] [id number]            Signed: Hilton Cork Salvatore Shear 10/04/2018, 10:42 AM

## 2018-10-04 NOTE — Evaluation (Signed)
Physical Therapy One Time Evaluation Patient Details Name: Travis Huff MRN: [id number] DOB: 1960-04-06 Today's Date: 10/04/2018   History of Present Illness  58 year old man admitted for dislocation of L hip; s/p closed reduction.  PMH:  PEs, CAD, arrhythmias, and HTN  Clinical Impression  Patient evaluated by Physical Therapy with no further acute PT needs identified. All education has been completed and the patient has no further questions.  Reviewed precautions with pt and he is able to recall from session with OT this morning.  Pt ambulated in hallway and practiced safe stair technique.  Pt reports "rubbing" of brace on inner thighs so discussed wearing over soft loose clothes and checking skin often.  Answered pt's questions within scope of practice, and pt feels ready for d/c home today. See below for any follow-up Physical Therapy or equipment needs. PT is signing off. Thank you for this referral.     Follow Up Recommendations Follow surgeon's recommendation for DC plan and follow-up therapies    Equipment Recommendations  None recommended by PT    Recommendations for Other Services       Precautions / Restrictions Precautions Precautions: Posterior Hip Precaution Comments: hip abductor brace at all times Restrictions Weight Bearing Restrictions: No Other Position/Activity Restrictions: WBAT      Mobility  Bed Mobility Overal bed mobility: Needs Assistance Bed Mobility: Supine to Sit     Supine to sit: Min assist     General bed mobility comments: pt up in recliner on arrival  Transfers Overall transfer level: Needs assistance Equipment used: Rolling walker (2 wheeled) Transfers: Sit to/from Stand Sit to Stand: Min guard         General transfer comment: cues for UE and LE placement  Ambulation/Gait Ambulation/Gait assistance: Min guard Gait Distance (Feet): 200 Feet Assistive device: Rolling walker (2 wheeled) Gait Pattern/deviations: Decreased  stride length;Wide base of support;Step-through pattern     General Gait Details: wide BOS due to hip abduction brace, steady with RW  Stairs Stairs: Yes Stairs assistance: Min guard Stair Management: Step to pattern;Forwards;Two rails Number of Stairs: 3 General stair comments: verbal cues for sequence, performed twice, pt feels comfortable with steps  Wheelchair Mobility    Modified Rankin (Stroke Patients Only)       Balance                                             Pertinent Vitals/Pain Pain Assessment: No/denies pain Faces Pain Scale: Hurts a little bit Pain Intervention(s): Monitored during session;Repositioned    Home Living Family/patient expects to be discharged to:: Private residence Living Arrangements: Spouse/significant other Available Help at Discharge: Family;Available PRN/intermittently Type of Home: House Home Access: Stairs to enter Entrance Stairs-Rails: Right;Left;Can reach both Entrance Stairs-Number of Steps: 4 Home Layout: One level Home Equipment: Walker - 2 wheels;Bedside commode      Prior Function Level of Independence: Independent with assistive device(s)         Comments: Pt's wife is an Glass blower/designer Dominance   Dominant Hand: Right    Extremity/Trunk Assessment   Upper Extremity Assessment Upper Extremity Assessment: Overall WFL for tasks assessed    Lower Extremity Assessment Lower Extremity Assessment: LLE deficits/detail LLE Deficits / Details: hip abduction brace in place LLE: Unable to fully assess due to immobilization  Communication   Communication: No difficulties  Cognition Arousal/Alertness: Awake/alert Behavior During Therapy: WFL for tasks assessed/performed Overall Cognitive Status: Within Functional Limits for tasks assessed                                        General Comments      Exercises     Assessment/Plan    PT Assessment Patent does not need  any further PT services  PT Problem List         PT Treatment Interventions      PT Goals (Current goals can be found in the Care Plan section)  Acute Rehab PT Goals PT Goal Formulation: All assessment and education complete, DC therapy    Frequency     Barriers to discharge        Co-evaluation               AM-PAC PT "6 Clicks" Mobility  Outcome Measure Help needed turning from your back to your side while in a flat bed without using bedrails?: A Little Help needed moving from lying on your back to sitting on the side of a flat bed without using bedrails?: A Little Help needed moving to and from a bed to a chair (including a wheelchair)?: A Little Help needed standing up from a chair using your arms (e.g., wheelchair or bedside chair)?: A Little Help needed to walk in hospital room?: A Little Help needed climbing 3-5 steps with a railing? : A Little 6 Click Score: 18    End of Session   Activity Tolerance: Patient tolerated treatment well Patient left: with call bell/phone within reach;in chair   PT Visit Diagnosis: Other abnormalities of gait and mobility (R26.89)    Time: [id number] PT Time Calculation (min) (ACUTE ONLY): 25 min   Charges:   PT Evaluation $PT Eval Low Complexity: [place], PT, DPT Acute Rehabilitation Services Office: [phone] Pager: [phone]  Trena Platt 10/04/2018, 12:46 PM

## 2018-10-04 NOTE — H&P (Signed)
ORTHOPAEDIC CONSULTATION  REQUESTING PHYSICIAN: Quintella Reichert, MD  PCP:  Silverio Decamp, MD  Chief Complaint: Left hip pain  HPI: Travis Huff is a 58 y.o. male who complains of left hip pain after he sustained a dislocation yesterday while sitting at the table.  He had left hip pain and inability to weight-bear.  He came to the emergency department today due to continued left hip pain and inability to weight-bear.  X-rays showed a posterior dislocation of his left total hip replacement.  He is known to me for previous staged bilateral total hip replacement, with the left side being done on 03/27/2018.  He had avascular necrosis with collapse of nearly the entire femoral head and superior dislocation of the hip.  Orthopedic consultation was placed by the emergency department staff.  They felt the patient was too high risk for sedation and closed reduction in the emergency department.      Past Medical History:  Diagnosis Date  . Arrhythmia   . Arthritis   . Asthma    as child  . Avascular necrosis of bone of left hip ([place])   . Back pain   . Blood clot in vein 06/2016   x 2 no bleeding disorders found  . Complication of anesthesia    ketamine hallucinations  . Coronary artery disease   . GERD (gastroesophageal reflux disease)   . Hypertension   . Viral pericarditis 20 yrs ago        Past Surgical History:  Procedure Laterality Date  . ABDOMINAL EXPOSURE N/A 06/23/2016   Procedure: ABDOMINAL EXPOSURE;  Surgeon: Rosetta Posner, MD;  Location: [place];  Service: Vascular;  Laterality: N/A;  . ANTERIOR LATERAL LUMBAR FUSION 4 LEVELS Right 06/23/2016   Procedure: Right  Lumbar two-three Lumbar three-four Lumbar four-five Anterolateral lumbar interbody fusion;  Surgeon: Erline Levine, MD;  Location: [place];  Service: Neurosurgery;  Laterality: Right;  . ANTERIOR LUMBAR FUSION N/A 06/23/2016   Procedure: Lumbar five -sacral one  Anterior lumbar interbody  fusion with Dr. Sherren Mocha Early for approach;  Surgeon: Erline Levine, MD;  Location: [facility];  Service: Neurosurgery;  Laterality: N/A;  . APPLICATION OF INTRAOPERATIVE CT SCAN N/A 06/26/2016   Procedure: APPLICATION OF INTRAOPERATIVE CT SCAN;  Surgeon: Erline Levine, MD;  Location: [place];  Service: Neurosurgery;  Laterality: N/A;  . CARDIAC CATHETERIZATION    . CARDIOVASCULAR STRESS TEST     Negative in May 2014  . COLONOSCOPY    . HERNIA REPAIR     umbilical  . IR IVC FILTER PLMT / S&I Burke Keels GUID/MOD SED  03/26/2018  . JOINT REPLACEMENT    . LEFT HEART CATH AND CORONARY ANGIOGRAPHY N/A 06/21/2018   Procedure: LEFT HEART CATH AND CORONARY ANGIOGRAPHY;  Surgeon: Jettie Booze, MD;  Location: [facility];  Service: Cardiovascular;  Laterality: N/A;  . POSTERIOR LUMBAR FUSION 4 LEVEL N/A 06/26/2016   Procedure: Thoracic ten to Pelvis fixation with AIRO;  Surgeon: Erline Levine, MD;  Location: [place];  Service: Neurosurgery;  Laterality: N/A;  . TENDON REPAIR     right hand  . TOTAL HIP ARTHROPLASTY Right 05/25/2014   Procedure: RIGHT TOTAL HIP ARTHROPLASTY ANTERIOR APPROACH;  Surgeon: Rod Can, MD;  Location: [facility];  Service: Orthopedics;  Laterality: Right;  . TOTAL HIP ARTHROPLASTY Left 03/27/2018   Procedure: TOTAL HIP ARTHROPLASTY ANTERIOR APPROACH- DA complex;  Surgeon: Rod Can, MD;  Location: [facility];  Service: Orthopedics;  Laterality: Left;  .  TOTAL KNEE ARTHROPLASTY Bilateral    Social History   Socioeconomic History  . Marital status: Married    Spouse name: Travis Huff  . Number of children: 2  . Years of education: 54  . Highest education level: Not on file  Occupational History  . Occupation: Public relations account executive  Social Needs  . Financial resource strain: Not on file  . Food insecurity    Worry: Never true    Inability: Never true  . Transportation needs    Medical: No    Non-medical: No  Tobacco Use  . Smoking status: Former  Smoker    Packs/day: 1.00    Years: 25.00    Pack years: 25.00    Types: Cigarettes    Quit date: 2000    Years since quitting: 20.7  . Smokeless tobacco: Current User    Types: Snuff  Substance and Sexual Activity  . Alcohol use: Yes    Alcohol/week: 14.0 standard drinks    Types: 14 Cans of beer per week    Comment: 2 beers QD  . Drug use: No  . Sexual activity: Not on file  Lifestyle  . Physical activity    Days per week: 0 days    Minutes per session: 0 min  . Stress: To some extent  Relationships  . Social Herbalist on phone: Not on file    Gets together: Not on file    Attends religious service: Not on file    Active member of club or organization: Not on file    Attends meetings of clubs or organizations: Not on file    Relationship status: Not on file  Other Topics Concern  . Not on file  Social History Narrative   Lives with wife in a one story home.  Has 2 children.  Works as a Public relations account executive.  Education: high school.         Family History  Adopted: Yes  Problem Relation Age of Onset  . Healthy Daughter   . Healthy Daughter         Allergies  Allergen Reactions  . Ketamine     hallucinations  . Morphine And Related Itching          Prior to Admission medications   Medication Sig Start Date End Date Taking? Authorizing Provider  acetaminophen (TYLENOL) 325 MG tablet Take 650 mg by mouth every 6 (six) hours as needed for mild pain or headache.   Yes [provider]  apixaban (ELIQUIS) 5 MG TABS tablet Take 1 tablet (5 mg total) by mouth 2 (two) times daily. 08/06/18  Yes Camnitz, Will Hassell Done, MD  aspirin 81 MG chewable tablet Chew 81 mg by mouth daily.   Yes [provider]  atorvastatin (LIPITOR) 80 MG tablet Take 1 tablet (80 mg total) by mouth daily at 6 PM. 06/22/18  Yes Mickle Plumb, Jacquelyn D, PA-C  cyclobenzaprine (FLEXERIL) 10 MG tablet Take 10 mg by mouth 3 (three) times  daily as needed for muscle spasms.   Yes [provider]  docusate sodium (COLACE) 100 MG capsule Take 1 capsule (100 mg total) by mouth 2 (two) times daily. 03/28/18  Yes Akemi Overholser, Aaron Edelman, MD  DULoxetine (CYMBALTA) 60 MG capsule Take 1 capsule (60 mg total) by mouth at bedtime. 03/28/18  Yes Silverio Decamp, MD  HYDROmorphone (DILAUDID) 4 MG tablet Take 4 mg by mouth every 4 (four) hours as needed for moderate pain or severe pain.   Yes [provider]  lisinopril-hydrochlorothiazide (ZESTORETIC) 20-25 MG tablet Take 1 tablet by mouth daily. 07/26/18  Yes Silverio Decamp, MD  metoprolol succinate (TOPROL-XL) 100 MG 24 hr tablet Take 1 tablet (100 mg total) by mouth daily. Take with or immediately following a meal. 06/23/18  Yes Visser, Jacquelyn D, PA-C  metoprolol succinate (TOPROL-XL) 50 MG 24 hr tablet Take 1 tablet (50 mg total) by mouth daily. In addition to your 100mg  tablet. Take with or immediately following a meal. 07/10/18 10/08/18 Yes Belva Crome, MD  nitroGLYCERIN (NITROSTAT) 0.4 MG SL tablet Place 1 tablet (0.4 mg total) under the tongue every 5 (five) minutes x 3 doses as needed for chest pain. If taking third dose, call 911. 06/22/18  Yes Mickle Plumb, Jacquelyn D, PA-C  oxyCODONE (OXYCONTIN) 20 mg 12 hr tablet Take 20 mg by mouth 2 (two) times daily as needed (pain).    Yes [provider]  polyethylene glycol (MIRALAX / GLYCOLAX) 17 g packet Take 17 g by mouth daily as needed for mild constipation.   Yes [provider]  terbinafine (LAMISIL) 250 MG tablet TAKE 1 TABLET (250 MG TOTAL) BY MOUTH DAILY. Patient taking differently: Take 250 mg by mouth daily.  09/02/18  Yes Silverio Decamp, MD  zolpidem (AMBIEN) 5 MG tablet Take 1 tablet (5 mg total) by mouth at bedtime as needed for sleep. 06/19/18  Yes Silverio Decamp, MD  albuterol (VENTOLIN HFA) 108 (90 Base) MCG/ACT inhaler Inhale 2 puffs into the lungs every 6 (six)  hours as needed for wheezing. Patient not taking: Reported on 10/03/2018 09/16/18   Silverio Decamp, MD  azithromycin (ZITHROMAX Z-PAK) 250 MG tablet Take 2 tablets (500 mg) on  Day 1,  followed by 1 tablet (250 mg) once daily on Days 2 through 5. Patient not taking: Reported on 10/03/2018 09/16/18   Silverio Decamp, MD  doxycycline (VIBRA-TABS) 100 MG tablet Take 1 tablet (100 mg total) by mouth 2 (two) times daily. Patient not taking: Reported on 10/03/2018 09/26/18   Trula Slade, DPM  mupirocin ointment (BACTROBAN) 2 % Apply 1 application topically 2 (two) times daily. Patient not taking: Reported on 10/03/2018 09/26/18   Trula Slade, DPM    Imaging Results (Last 48 hours)  Dg Hip Unilat W Or Wo Pelvis 2-3 Views Left  Result Date: 10/03/2018 CLINICAL DATA:  Pt sitting in chair at home, twisted and felt lt hip pop out, severe pain EXAM: DG HIP (WITH OR WITHOUT PELVIS) 2-3V LEFT COMPARISON:  None. FINDINGS: Left hip prosthesis is dislocated, femoral component lying above the acetabular component. No fracture. No evidence of loosening the orthopedic hardware. IMPRESSION: Dislocated left hip prosthesis. Electronically Signed   By: Lajean Manes M.D.   On: 10/03/2018 12:43     Positive ROS: All other systems have been reviewed and were otherwise negative with the exception of those mentioned in the HPI and as above.  Physical Exam: General: Alert, no acute distress Cardiovascular: No pedal edema Respiratory: No cyanosis, no use of accessory musculature GI: No organomegaly, abdomen is soft and non-tender Skin: No lesions in the area of chief complaint Neurologic: Sensation intact distally Psychiatric: Patient is competent for consent with normal mood and affect Lymphatic: No axillary or cervical lymphadenopathy  MUSCULOSKELETAL: Examination of the left hip reveals a healed anterior incision.  He is shortened and rotated.  Neurovascularly intact.   Assessment: First episode dislocation left total hip arthroplasty Eliquis usage. Multiple medical problems  Plan:  I discussed the findings with the patient and his wife.  They are very upset over the ED course today, and voiced their frustrations over several different issues.  We discussed that after 6 months, having a first episode dislocation is unusual.  We reviewed that his component position on x-ray is excellent.  I am not sure how his hip could have dislocated while simply seated at a table.  We discussed close reduction in the emergency department.  His hip is been out for over 18 hours.  If closed reduction is not possible, then he will need to be admitted to the hospital.  We will have to wait the appropriate amount of time due to his Eliquis usage, and then proceed with an open reduction.  If close reduction is successful, then I would plan admit him for overnight observation and obtain a hip abduction brace.  All questions were solicited and answered.    Bertram Savin, MD Cell [phone]    10/03/2018 4:52 PM

## 2018-10-07 ENCOUNTER — Other Ambulatory Visit: Payer: Self-pay | Admitting: *Deleted

## 2018-10-07 ENCOUNTER — Ambulatory Visit (HOSPITAL_COMMUNITY): Payer: 59

## 2018-10-07 ENCOUNTER — Encounter: Payer: Self-pay | Admitting: *Deleted

## 2018-10-07 ENCOUNTER — Telehealth (HOSPITAL_COMMUNITY): Payer: Self-pay | Admitting: Sports Medicine

## 2018-10-07 NOTE — Patient Outreach (Addendum)
[facility]) Care Management  10/07/2018  ENMANUEL RAUM 11-Feb-1960 [id number]   Transition of care call/case closure   Referral received: 10/04/18 Initial outreach: 10/07/18 Insurance: [place] [employer] Plan   Subjective: Patient returned call in response to voice message left by this RNCM approximately 25 minutes ago.; 2 HIPAA identifiers verified. Explained purpose of call and completed transition of care assessment.  Mr. Schoof states he is doing well, denies pain or any post-operative problems, says he is wearing his left hip abduction brace at all times as directed. He says his wife will provide transportation since he is unable to drive for 6 weeks and has multiple provider appointments in the near future. He verifies that he called and cancelled his cardiac rehabilitation appointments per his surgeon's instruction. He denies any ongoing health issues that would benefit from a referral to one of the [place] chronic disease management programs.  He says he uses a Ambulance person.  He denies educational needs related to staying safe during the COVID 19 pandemic.     Objective: Per the electronic medical record, Mr. Carle Laque  was hospitalized at [facility] from 10/1-10/2 for closed reduction of left hip dislocation on 10/1 after the hip dislocated while he was sitting in a chair. He had a total left hip arthroplasty - anterior approach- on 03/27/18 to treat avascular necrosis of bone of the left hip Comorbidities include: non  ST elevation myocardial infarction on 06/20/18, history of DVT, HTN, ischemic cardiomyopathy, near syncope, viral pericarditis, GERD, cellulitis of right foot, generalized anxiety disorder, insomnia,. Mixed hyperlipidemia, obesity, spinal stenosis He was discharged to home on 10/04/18 without the need for home health services and with a left hip abduction brace.   Assessment:  Patient voices good understanding of all  discharge instructions.  See transition of care flowsheet for assessment details.   Plan:  Reviewed hospital discharge diagnosis of closed reduction of left hip dislocation and discharge treatment plan using hospital discharge instructions, assessing medication adherence, reviewing problems requiring provider notification, and discussing the importance of follow up with his surgeon as directed. No ongoing care management needs identified so will close case to [place] Management services and route successful outreach letter with [place] Management pamphlet and 24 Hour Nurse Line Magnet to [facility] Management clinical pool to be mailed to patient's home address. ( will cancel unsuccessful outreach letter to patient since he returned call and transition of care assessment was completed).    Barrington Ellison RN,CCM,CDE [place] [facility] Management Coordinator Office Phone [phone] Office Fax [phone]

## 2018-10-07 NOTE — Patient Outreach (Signed)
Was assigned to [name] on 10/04/2018, This case is closed TOC was. completed

## 2018-10-07 NOTE — Patient Outreach (Addendum)
[facility]) Care Management  10/07/2018  Travis Huff 05/08/1960 [id number]   Transition of care telephone call  Referral received: 10/04/18 Initial outreach: 10/07/18 Insurance: [place]  Initial unsuccessful telephone call to patient's preferred number in order to complete transition of care assessment; no answer, left HIPAA compliant voicemail message requesting return call.   Objective: Per the electronic medical record, Travis Huff  was hospitalized at [facility] from 10/1-10/2 for closed reduction of left hip dislocation on 10/1 after the hip dislocated while he was sitting in a chair. He had a total left hip arthroplasty - anterior approach- on 03/27/18 to treat avascular necrosis of bone of the left hip Comorbidities include: non  ST elevation myocardial infarction on 06/20/18, history of DVT, HTN, ischemic cardiomyopathy, near syncope, viral pericarditis, GERD, cellulitis of right foot, generalized anxiety disorder, insomnia,. Mixed hyperlipidemia, obesity, spinal stenosis He was discharged to home on 10/04/18 without the need for home health services. He was issued a left hip abduction brace and instructed to wear it all times.  Per chart review, patient called the cardiac rehabilitation clinic and cancelled his upcoming appointments for the next 6 weeks per his orthopedic surgeon's instructions.  Plan: This RNCM will route unsuccessful outreach letter with [place] Management pamphlet and 24 hour Nurse Advice Line Magnet to [facility] Management clinical pool to be mailed to patient's home address. This RNCM will attempt another outreach within 4 business days.  Barrington Ellison RN,CCM,CDE [facility] Management Coordinator Office Phone [phone] Office Fax [phone]

## 2018-10-08 ENCOUNTER — Telehealth (HOSPITAL_COMMUNITY): Payer: Self-pay

## 2018-10-08 ENCOUNTER — Encounter (HOSPITAL_COMMUNITY): Payer: Self-pay

## 2018-10-08 DIAGNOSIS — I214 Non-ST elevation (NSTEMI) myocardial infarction: Secondary | ICD-10-CM

## 2018-10-08 MED FILL — LISINOPRIL-HCTZ 20-25 MG TA: 20-25 | 90 days supply | Qty: 90 | Fill #0

## 2018-10-08 NOTE — Progress Notes (Signed)
Discharge Progress Report  Patient Details  Name: Travis Huff MRN: [id number] Date of Birth: 1960-05-18 Referring Provider:     CARDIAC REHAB PHASE II ORIENTATION from 09/05/2018 in [facility]  Referring Provider  Dr. Tamala Julian       Number of Visits: 1 (orientation appointment with 6 min walk)  Reason for Discharge:  Early Exit:  orthopedic restrictions secondary to dislocated left hip with recent closed reduction  Smoking History:  Social History   Tobacco Use  Smoking Status Former Smoker  . Packs/day: 1.00  . Years: 25.00  . Pack years: 25.00  . Types: Cigarettes  . Quit date: 2000  . Years since quitting: 20.7  Smokeless Tobacco Current User  . Types: Snuff    Diagnosis:  NSTEMI (non-ST elevated myocardial infarction) ([place]) 06/20/18 SCAD of LAD  ADL UCSD:   Initial Exercise Prescription: Initial Exercise Prescription - 09/05/18 1100      Date of Initial Exercise RX and Referring Provider   Date  09/05/18    Referring Provider  Dr. Tamala Julian    Expected Discharge Date  10/18/18      NuStep   Level  2    SPM  75    Minutes  30    METs  1.6      Prescription Details   Frequency (times per week)  2    Duration  Progress to 30 minutes of continuous aerobic without signs/symptoms of physical distress      Intensity   THRR 40-80% of Max Heartrate  65-131    Ratings of Perceived Exertion  11-13      Progression   Progression  Continue to progress workloads to maintain intensity without signs/symptoms of physical distress.      Resistance Training   Training Prescription  Yes    Weight  3 lbs.     Reps  10-15       Discharge Exercise Prescription (Final Exercise Prescription Changes):   Functional Capacity: 6 Minute Walk    Row Name 09/05/18 1123         6 Minute Walk   Phase  Initial     Distance  856 feet     Walk Time  6 minutes     # of Rest Breaks  0     MPH  1.6     METS  1.8     RPE  12     Perceived  Dyspnea   0     VO2 Peak  6.6     Symptoms  No     Resting HR  82 bpm     Resting BP  114/70     Resting Oxygen Saturation   96 %     Exercise Oxygen Saturation  during 6 min walk  97 %     Max Ex. HR  109 bpm     Max Ex. BP  140/82     2 Minute Post BP  124/72        Psychological, QOL, Others - Outcomes: PHQ 2/9: Depression screen [facility] 2/9 09/05/2018 06/15/2017 05/11/2016  Decreased Interest 0 1 0  Down, Depressed, Hopeless 0 0 0  PHQ - 2 Score 0 1 0  Altered sleeping - 0 2  Tired, decreased energy - 1 3  Change in appetite - 0 0  Feeling bad or failure about yourself  - 0 0  Trouble concentrating - 0 0  Moving slowly or fidgety/restless -  0 3  Suicidal thoughts - 0 0  PHQ-9 Score - 2 8  Difficult doing work/chores - Somewhat difficult -  Some recent data might be hidden    Quality of Life: Quality of Life - 09/05/18 1133      Quality of Life   Select  Quality of Life      Quality of Life Scores   Health/Function Pre  17.03 %    Socioeconomic Pre  24.93 %    Psych/Spiritual Pre  19.93 %    Family Pre  30 %    GLOBAL Pre  21.16 %       Personal Goals: Goals established at orientation with interventions provided to work toward goal. Personal Goals and Risk Factors at Admission - 09/05/18 1128      Core Components/Risk Factors/Patient Goals on Admission    Weight Management  Yes;Obesity;Weight Maintenance;Weight Loss    Intervention  Weight Management: Develop a combined nutrition and exercise program designed to reach desired caloric intake, while maintaining appropriate intake of nutrient and fiber, sodium and fats, and appropriate energy expenditure required for the weight goal.;Weight Management: Provide education and appropriate resources to help participant work on and attain dietary goals.;Weight Management/Obesity: Establish reasonable short term and long term weight goals.;Obesity: Provide education and appropriate resources to help participant work on and  attain dietary goals.    Admit Weight  326 lb 4.5 oz (148 kg)    Goal Weight: Long Term  246 lb (111.6 kg)    Expected Outcomes  Short Term: Continue to assess and modify interventions until short term weight is achieved;Long Term: Adherence to nutrition and physical activity/exercise program aimed toward attainment of established weight goal;Weight Maintenance: Understanding of the daily nutrition guidelines, which includes 25-35% calories from fat, 7% or less cal from saturated fats, less than 200mg  cholesterol, less than 1.5gm of sodium, & 5 or more servings of fruits and vegetables daily;Weight Loss: Understanding of general recommendations for a balanced deficit meal plan, which promotes 1-2 lb weight loss per week and includes a negative energy balance of [phone] kcal/d;Understanding recommendations for meals to include 15-35% energy as protein, 25-35% energy from fat, 35-60% energy from carbohydrates, less than 200mg  of dietary cholesterol, 20-35 gm of total fiber daily;Understanding of distribution of calorie intake throughout the day with the consumption of 4-5 meals/snacks    Tobacco Cessation  Yes    Expected Outcomes  Short Term: Will demonstrate readiness to quit, by selecting a quit date.;Long Term: Complete abstinence from all tobacco products for at least 12 months from quit date.;Short Term: Will quit all tobacco product use, adhering to prevention of relapse plan.    Hypertension  Yes    Intervention  Provide education on lifestyle modifcations including regular physical activity/exercise, weight management, moderate sodium restriction and increased consumption of fresh fruit, vegetables, and low fat dairy, alcohol moderation, and smoking cessation.;Monitor prescription use compliance.    Expected Outcomes  Short Term: Continued assessment and intervention until BP is < 140/34mm HG in hypertensive participants. < 130/66mm HG in hypertensive participants with diabetes, heart failure or  chronic kidney disease.;Long Term: Maintenance of blood pressure at goal levels.        Personal Goals Discharge:   Exercise Goals and Review: Exercise Goals    Row Name 09/05/18 1127             Exercise Goals   Increase Physical Activity  Yes       Intervention  Provide advice, education,  support and counseling about physical activity/exercise needs.;Develop an individualized exercise prescription for aerobic and resistive training based on initial evaluation findings, risk stratification, comorbidities and participant's personal goals.       Expected Outcomes  Short Term: Attend rehab on a regular basis to increase amount of physical activity.;Long Term: Add in home exercise to make exercise part of routine and to increase amount of physical activity.;Long Term: Exercising regularly at least 3-5 days a week.       Increase Strength and Stamina  Yes       Intervention  Provide advice, education, support and counseling about physical activity/exercise needs.;Develop an individualized exercise prescription for aerobic and resistive training based on initial evaluation findings, risk stratification, comorbidities and participant's personal goals.       Expected Outcomes  Short Term: Increase workloads from initial exercise prescription for resistance, speed, and METs.;Short Term: Perform resistance training exercises routinely during rehab and add in resistance training at home;Long Term: Improve cardiorespiratory fitness, muscular endurance and strength as measured by increased METs and functional capacity (6MWT)       Able to understand and use rate of perceived exertion (RPE) scale  Yes       Intervention  Provide education and explanation on how to use RPE scale       Expected Outcomes  Short Term: Able to use RPE daily in rehab to express subjective intensity level;Long Term:  Able to use RPE to guide intensity level when exercising independently       Knowledge and understanding of  Target Heart Rate Range (THRR)  Yes       Intervention  Provide education and explanation of THRR including how the numbers were predicted and where they are located for reference       Expected Outcomes  Short Term: Able to state/look up THRR;Long Term: Able to use THRR to govern intensity when exercising independently;Short Term: Able to use daily as guideline for intensity in rehab       Able to check pulse independently  Yes       Intervention  Provide education and demonstration on how to check pulse in carotid and radial arteries.;Review the importance of being able to check your own pulse for safety during independent exercise       Expected Outcomes  Short Term: Able to explain why pulse checking is important during independent exercise;Long Term: Able to check pulse independently and accurately       Understanding of Exercise Prescription  Yes       Intervention  Provide education, explanation, and written materials on patient's individual exercise prescription       Expected Outcomes  Short Term: Able to explain program exercise prescription;Long Term: Able to explain home exercise prescription to exercise independently          Exercise Goals Re-Evaluation: Exercise Goals Re-Evaluation    Row Name 09/18/18 1637             Exercise Goal Re-Evaluation   Comments  Patient out with pneumonia, has not started exercise yet.          Nutrition & Weight - Outcomes: Pre Biometrics - 09/05/18 1127      Pre Biometrics   Height  5\' 11"  (1.803 m)    Weight  (!) 326 lb 4.5 oz (148 kg)    BMI (Calculated)  45.53    Triceps Skinfold  32 mm    % Body Fat  42.3 %  Grip Strength  40 kg    Flexibility  0 in    Single Leg Stand  0 seconds        Nutrition:   Nutrition Discharge:   Education Questionnaire Score: Knowledge Questionnaire Score - 09/05/18 1148      Knowledge Questionnaire Score   Pre Score  22/24       Patient has been discharged from cardiac rehab. Spoke  with patient and wife via telephone. Patient will remain in abductor brace for next 6 weeks. Appointments cancelled. Will reconsider for CR in the future if eligible. Blakley Michna E. Rollene Rotunda, RN, BSN

## 2018-10-08 NOTE — Telephone Encounter (Signed)
Unsuccessful telephone encounter to Mr. Fawcett to follow up on his incoming call related to requesting discharge from cardiac rehab secondary to hip surgery. Patient will be discharged however requested patient return call if he had any questions or concerns related to his discharge. Johnella Crumm E. Rollene Rotunda, RN BSN

## 2018-10-09 ENCOUNTER — Ambulatory Visit (HOSPITAL_COMMUNITY): Payer: 59

## 2018-10-11 ENCOUNTER — Ambulatory Visit (HOSPITAL_COMMUNITY): Payer: 59

## 2018-10-11 ENCOUNTER — Ambulatory Visit (INDEPENDENT_AMBULATORY_CARE_PROVIDER_SITE_OTHER): Payer: 59 | Admitting: Sports Medicine

## 2018-10-11 DIAGNOSIS — M19011 Primary osteoarthritis, right shoulder: Secondary | ICD-10-CM

## 2018-10-11 DIAGNOSIS — S73005S Unspecified dislocation of left hip, sequela: Secondary | ICD-10-CM | POA: Diagnosis not present

## 2018-10-11 MED ORDER — OXYCODONE HCL ER 20 MG PO T12A: 20.0000 mg | EXTENDED_RELEASE_TABLET | Freq: Two times a day (BID) | ORAL | 0 refills | Status: DC | PRN

## 2018-10-11 MED ORDER — HYDROMORPHONE HCL 4 MG PO TABS: 4.0000 mg | ORAL_TABLET | ORAL | 0 refills | Status: DC | PRN

## 2018-10-11 MED FILL — HYDROmorphone HCL 4 MG TABS: 4 | 5 days supply | Qty: 30 | Fill #0

## 2018-10-11 NOTE — Assessment & Plan Note (Signed)
Did not respond to a steroid injection, end-stage bone-on-bone glenohumeral osteoarthritis. He is going to think about PRP injection, we would likely do 2 PRP injections 1 week apart. He does understand that he needs a shoulder replacement. I am going to refill some of his pain medication in the meantime.

## 2018-10-11 NOTE — Assessment & Plan Note (Addendum)
Dislocation of arthroplasty with reduction in the OR. It sounds as though he had some qualms with his care with Dr. Rondel Baton is asking for a second opinion, we will do 2-second opinions, one from Dr. Mayer Camel one from Dr. Maureen Ralphs per his request.

## 2018-10-11 NOTE — Progress Notes (Signed)
Virtual Visit via WebEx/MyChart   I connected with  Travis Huff  on 10/11/18 via WebEx/MyChart/Doximity Video and verified that I am speaking with the correct person using two identifiers.   I discussed the limitations, risks, security and privacy concerns of performing an evaluation and management service by WebEx/MyChart/Doximity Video, including the higher likelihood of inaccurate diagnosis and treatment, and the availability of in person appointments.  We also discussed the likely need of an additional face to face encounter for complete and high quality delivery of care.  I also discussed with the patient that there may be a patient responsible charge related to this service. The patient expressed understanding and wishes to proceed.  Provider location is either at home or medical facility. Patient location is at their home, different from provider location. People involved in care of the patient during this telehealth encounter were myself, my nurse/medical assistant, and my front office/scheduling team member.  Subjective:    CC: Follow-up from ER  HPI: Travis Huff is a pleasant 58 year old male, he recently suffered a dislocation of his hip prosthesis.  It was reduced in the operating room.  He has been on a walker since, unfortunately this is creating severe pain in his shoulder, he does have severe end-stage glenohumeral osteoarthritis.  I reviewed the past medical history, family history, social history, surgical history, and allergies today and no changes were needed.  Please see the problem list section below in epic for further details.  Past Medical History: Past Medical History:  Diagnosis Date  . Arrhythmia   . Arthritis   . Asthma    as child  . Avascular necrosis of bone of left hip ([place])   . Back pain   . Blood clot in vein 06/2016   x 2 no bleeding disorders found  . Complication of anesthesia    ketamine hallucinations  . Coronary artery disease   . Dislocated  hip, left, initial encounter ([place]) 10/03/2018  . GERD (gastroesophageal reflux disease)   . Hypertension   . Viral pericarditis 20 yrs ago   Past Surgical History: Past Surgical History:  Procedure Laterality Date  . ABDOMINAL EXPOSURE N/A 06/23/2016   Procedure: ABDOMINAL EXPOSURE;  Surgeon: Rosetta Posner, MD;  Location: [facility];  Service: Vascular;  Laterality: N/A;  . ANTERIOR LATERAL LUMBAR FUSION 4 LEVELS Right 06/23/2016   Procedure: Right  Lumbar two-three Lumbar three-four Lumbar four-five Anterolateral lumbar interbody fusion;  Surgeon: Erline Levine, MD;  Location: [facility];  Service: Neurosurgery;  Laterality: Right;  . ANTERIOR LUMBAR FUSION N/A 06/23/2016   Procedure: Lumbar five -sacral one  Anterior lumbar interbody fusion with Dr. Sherren Mocha Early for approach;  Surgeon: Erline Levine, MD;  Location: [facility];  Service: Neurosurgery;  Laterality: N/A;  . APPLICATION OF INTRAOPERATIVE CT SCAN N/A 06/26/2016   Procedure: APPLICATION OF INTRAOPERATIVE CT SCAN;  Surgeon: Erline Levine, MD;  Location: [facility];  Service: Neurosurgery;  Laterality: N/A;  . CARDIAC CATHETERIZATION    . CARDIOVASCULAR STRESS TEST     Negative in May 2014  . COLONOSCOPY    . HERNIA REPAIR     umbilical  . HIP CLOSED REDUCTION Left 10/03/2018   Procedure: CLOSED REDUCTION HIP;  Surgeon: Rod Can, MD;  Location: [facility];  Service: Orthopedics;  Laterality: Left;  . IR IVC FILTER PLMT / S&I Burke Keels GUID/MOD SED  03/26/2018  . JOINT REPLACEMENT    . LEFT HEART CATH AND CORONARY ANGIOGRAPHY N/A 06/21/2018   Procedure: LEFT HEART  CATH AND CORONARY ANGIOGRAPHY;  Surgeon: Jettie Booze, MD;  Location: [facility];  Service: Cardiovascular;  Laterality: N/A;  . POSTERIOR LUMBAR FUSION 4 LEVEL N/A 06/26/2016   Procedure: Thoracic ten to Pelvis fixation with AIRO;  Surgeon: Erline Levine, MD;  Location: [facility];  Service: Neurosurgery;  Laterality: N/A;  . TENDON REPAIR     right hand  . TOTAL HIP ARTHROPLASTY  Right 05/25/2014   Procedure: RIGHT TOTAL HIP ARTHROPLASTY ANTERIOR APPROACH;  Surgeon: Rod Can, MD;  Location: [facility];  Service: Orthopedics;  Laterality: Right;  . TOTAL HIP ARTHROPLASTY Left 03/27/2018   Procedure: TOTAL HIP ARTHROPLASTY ANTERIOR APPROACH- DA complex;  Surgeon: Rod Can, MD;  Location[facility];  Service: Orthopedics;  Laterality: Left;  . TOTAL KNEE ARTHROPLASTY Bilateral    Social History: Social History   Socioeconomic History  . Marital status: Married    Spouse name: [name]  . Number of children: 2  . Years of education: 27  . Highest education level: Not on file  Occupational History  . Occupation: Public relations account executive  Social Needs  . Financial resource strain: Not on file  . Food insecurity    Worry: Never true    Inability: Never true  . Transportation needs    Medical: No    Non-medical: No  Tobacco Use  . Smoking status: Former Smoker    Packs/day: 1.00    Years: 25.00    Pack years: 25.00    Types: Cigarettes    Quit date: 2000    Years since quitting: 20.7  . Smokeless tobacco: Current User    Types: Snuff  Substance and Sexual Activity  . Alcohol use: Yes    Alcohol/week: 14.0 standard drinks    Types: 14 Cans of beer per week    Comment: 2 beers QD  . Drug use: No  . Sexual activity: Not on file  Lifestyle  . Physical activity    Days per week: 0 days    Minutes per session: 0 min  . Stress: To some extent  Relationships  . Social Herbalist on phone: Not on file    Gets together: Not on file    Attends religious service: Not on file    Active member of club or organization: Not on file    Attends meetings of clubs or organizations: Not on file    Relationship status: Not on file  Other Topics Concern  . Not on file  Social History Narrative   Lives with wife in a one story home.  Has 2 children.  Works as a Public relations account executive.  Education: high school.    Family History: Family History  Adopted: Yes  Problem  Relation Age of Onset  . Healthy Daughter   . Healthy Daughter    Allergies: Allergies  Allergen Reactions  . Ketamine     hallucinations  . Morphine And Related Itching   Medications: See med rec.  Review of Systems: No fevers, chills, night sweats, weight loss, chest pain, or shortness of breath.   Objective:    General: Speaking full sentences, no audible heavy breathing.  Sounds alert and appropriately interactive.  Appears well.  Face symmetric.  Extraocular movements intact.  Pupils equal and round.  No nasal flaring or accessory muscle use visualized.  No other physical exam performed due to the non-physical nature of this visit.  Impression and Recommendations:    Osteoarthritis of right glenohumeral joint Did not respond  to a steroid injection, end-stage bone-on-bone glenohumeral osteoarthritis. He is going to think about PRP injection, we would likely do 2 PRP injections 1 week apart. He does understand that he needs a shoulder replacement. I am going to refill some of his pain medication in the meantime.  Closed dislocation of left hip (HCC) Dislocation of arthroplasty with reduction in the OR. It sounds as though he had some qualms with his care with Dr. Rondel Baton is asking for a second opinion, we will do 2-second opinions, one from Dr. Mayer Camel one from Dr. Maureen Ralphs per his request.   I discussed the above assessment and treatment plan with the patient. The patient was provided an opportunity to ask questions and all were answered. The patient agreed with the plan and demonstrated an understanding of the instructions.   The patient was advised to call back or seek an in-person evaluation if the symptoms worsen or if the condition fails to improve as anticipated.   I provided 25 minutes of non-face-to-face time during this encounter, 15 minutes of additional time was needed to gather information, review chart, records, communicate/coordinate with staff remotely,  troubleshooting the multiple errors that we get every time when trying to do video calls through the electronic medical record, WebEx, and Doximity, restart the encounter multiple times due to instability of the software, as well as complete documentation.   ___________________________________________ Gwen Her. Dianah Field, M.D., ABFM., CAQSM. Primary Care and Sports Medicine [facility]  Adjunct Professor of [place] [employer] [facility] of Medicine

## 2018-10-14 ENCOUNTER — Ambulatory Visit (HOSPITAL_COMMUNITY): Payer: 59

## 2018-10-14 ENCOUNTER — Encounter: Payer: Self-pay | Admitting: Sports Medicine

## 2018-10-14 DIAGNOSIS — F5101 Primary insomnia: Secondary | ICD-10-CM

## 2018-10-14 MED ORDER — ZOLPIDEM TARTRATE 10 MG PO TABS: 10.0000 mg | ORAL_TABLET | Freq: Every evening | ORAL | 1 refills | Status: DC | PRN

## 2018-10-14 MED FILL — METOPROLOL SUCCINATE ER 100: 100 | 90 days supply | Qty: 90 | Fill #1

## 2018-10-14 MED FILL — ATORVASTATIN 80 MG TABLET: 80 | 90 days supply | Qty: 90 | Fill #1

## 2018-10-15 ENCOUNTER — Encounter: Payer: Self-pay | Admitting: Sports Medicine

## 2018-10-15 MED FILL — ZOLPIDEM TARTRATE 10 MG TAB: 10 | 30 days supply | Qty: 30 | Fill #0

## 2018-10-15 NOTE — Telephone Encounter (Signed)
[facility], let them know that he has been on this before and it worked well.  He is already on an immediate release medication as well, Dilaudid.

## 2018-10-16 ENCOUNTER — Ambulatory Visit (HOSPITAL_COMMUNITY): Payer: 59

## 2018-10-16 NOTE — Telephone Encounter (Signed)
Called pharmacy - was advised because the Pt did not complete the Dilaudid therapy for 7 days the OxyContin will not go through insurance.   Pt was on Dilaudid back in March, but that is outside the approved trial window.  Pt will need to complete a new 7 day supply of Dilaudid if OxyContin is the next desired step.

## 2018-10-18 ENCOUNTER — Ambulatory Visit (HOSPITAL_COMMUNITY): Payer: 59

## 2018-10-18 DIAGNOSIS — S73005D Unspecified dislocation of left hip, subsequent encounter: Secondary | ICD-10-CM | POA: Diagnosis not present

## 2018-10-18 DIAGNOSIS — Z96642 Presence of left artificial hip joint: Secondary | ICD-10-CM | POA: Diagnosis not present

## 2018-10-21 ENCOUNTER — Ambulatory Visit (HOSPITAL_COMMUNITY): Payer: 59

## 2018-10-21 ENCOUNTER — Encounter: Payer: Self-pay | Admitting: Sports Medicine

## 2018-10-21 ENCOUNTER — Other Ambulatory Visit (HOSPITAL_COMMUNITY): Payer: 59

## 2018-10-21 DIAGNOSIS — H8110 Benign paroxysmal vertigo, unspecified ear: Secondary | ICD-10-CM | POA: Insufficient documentation

## 2018-10-21 DIAGNOSIS — H8113 Benign paroxysmal vertigo, bilateral: Secondary | ICD-10-CM

## 2018-10-21 NOTE — Telephone Encounter (Signed)
Probably Colletta Maryland with physical therapy, I think she also does vestibular rehab, I will place another referral.

## 2018-10-23 ENCOUNTER — Telehealth: Payer: Self-pay | Admitting: *Deleted

## 2018-10-23 ENCOUNTER — Ambulatory Visit (HOSPITAL_COMMUNITY): Payer: 59

## 2018-10-23 NOTE — Telephone Encounter (Signed)
Pt states he was calling about a medication someone said he had to be on for 6-9 months.

## 2018-10-23 NOTE — Telephone Encounter (Signed)
I informed pt of Dr. Leigh Aurora review of results and recommendation to complete the lamisil prescribed by the PCP, and reappoint in 6-9 months to check progress.

## 2018-10-23 NOTE — Telephone Encounter (Signed)
-----   Message from Trula Slade, DPM sent at 10/22/2018  7:46 AM EDT ----- + fungus- He is on lamisil by his PCP. He should finish the 90 course of that medication.

## 2018-10-23 NOTE — Telephone Encounter (Signed)
I called pt and he stated he was half asleep when I called. I told I reiterated the instructions of the morning and pt stated understanding.

## 2018-10-24 ENCOUNTER — Encounter (HOSPITAL_BASED_OUTPATIENT_CLINIC_OR_DEPARTMENT_OTHER): Payer: 59 | Admitting: Cardiology

## 2018-10-25 ENCOUNTER — Ambulatory Visit (HOSPITAL_COMMUNITY): Payer: 59

## 2018-10-28 ENCOUNTER — Ambulatory Visit (HOSPITAL_COMMUNITY): Payer: 59

## 2018-10-29 DIAGNOSIS — M25552 Pain in left hip: Secondary | ICD-10-CM | POA: Diagnosis not present

## 2018-10-30 ENCOUNTER — Ambulatory Visit (HOSPITAL_COMMUNITY): Payer: 59

## 2018-11-01 ENCOUNTER — Ambulatory Visit (HOSPITAL_COMMUNITY): Payer: 59

## 2018-11-04 ENCOUNTER — Ambulatory Visit (HOSPITAL_COMMUNITY): Payer: 59

## 2018-11-05 MED FILL — ELIQUIS 5 MG TABLET: 5 | 30 days supply | Qty: 60 | Fill #0

## 2018-11-06 ENCOUNTER — Ambulatory Visit (HOSPITAL_COMMUNITY): Payer: 59

## 2018-11-08 ENCOUNTER — Ambulatory Visit (HOSPITAL_COMMUNITY): Payer: 59

## 2018-11-11 ENCOUNTER — Ambulatory Visit (HOSPITAL_COMMUNITY): Payer: 59

## 2018-11-11 ENCOUNTER — Ambulatory Visit: Payer: 59 | Admitting: Cardiology

## 2018-11-13 ENCOUNTER — Other Ambulatory Visit: Payer: Self-pay

## 2018-11-13 ENCOUNTER — Ambulatory Visit: Payer: 59 | Admitting: Cardiology

## 2018-11-13 ENCOUNTER — Encounter: Payer: Self-pay | Admitting: Cardiology

## 2018-11-13 ENCOUNTER — Ambulatory Visit (HOSPITAL_COMMUNITY): Payer: 59

## 2018-11-13 VITALS — BP 128/84 | HR 84 | Ht 72.0 in | Wt 332.8 lb

## 2018-11-13 DIAGNOSIS — Z7901 Long term (current) use of anticoagulants: Secondary | ICD-10-CM

## 2018-11-13 DIAGNOSIS — I2510 Atherosclerotic heart disease of native coronary artery without angina pectoris: Secondary | ICD-10-CM

## 2018-11-13 DIAGNOSIS — I255 Ischemic cardiomyopathy: Secondary | ICD-10-CM

## 2018-11-13 DIAGNOSIS — E785 Hyperlipidemia, unspecified: Secondary | ICD-10-CM | POA: Diagnosis not present

## 2018-11-13 DIAGNOSIS — I480 Paroxysmal atrial fibrillation: Secondary | ICD-10-CM | POA: Diagnosis not present

## 2018-11-13 DIAGNOSIS — I214 Non-ST elevation (NSTEMI) myocardial infarction: Secondary | ICD-10-CM

## 2018-11-13 MED ORDER — METOPROLOL SUCCINATE ER 100 MG PO TB24: 200.0000 mg | ORAL_TABLET | Freq: Every day | ORAL | 3 refills | Status: DC

## 2018-11-13 NOTE — Patient Instructions (Addendum)
Medication Instructions:  INCREASE: Metoprolol to 200 mg every morning   *If you need a refill on your cardiac medications before your next appointment, please call your pharmacy*  Lab Work: None   If you have labs (blood work) drawn today and your tests are completely normal, you will receive your results only by: [name] MyChart Message (if you have MyChart) OR . A paper copy in the mail If you have any lab test that is abnormal or we need to change your treatment, we will call you to review the results.  Testing/Procedures: None   Follow-Up: You are scheduled to see Dr. Curt Bears on 12/09/2018 @ 3:00 PM  You are scheduled to see Dr. Tamala Julian on 02/21/2019 @ 3:40 PM  Other Instructions

## 2018-11-13 NOTE — Progress Notes (Signed)
Cardiology Office Note   Date:  11/13/2018   ID:  Travis Huff, DOB [birth year], MRN [id number]  PCP:  Silverio Decamp, MD  Cardiologist:  Dr. Tamala Julian EP Dr. Curt Bears    Chief Complaint  Patient presents with  . Cardiomyopathy      History of Present Illness: Travis Huff is a 58 y.o. male who presents for cardiomyopathy and a fib.    He has a history significant for anxiety, hypertension, obesity, multiple PEs on Eliquis status post IVC filter, non-STEMI due to coronary artery dissection, PAF, and chronic combined systolic and diastolic heart failure due to ischemic cardiomyopathy. (EF 40-45%).  He had a recent hospitalization for palpitations, weakness, and syncope.  He presented to his primary physician's office and was found to have an elevated troponin on labs.  His primary physician called him and advised him to go to the hospital.  Hospital work-up identified apical narrowing of the LAD which was due to coronary dissection.  He wore a cardiac monitor for evaluation of near syncope which found rapid atrial fibrillation.  His metoprolol dose was increased since that time. This patients CHA2DS2-VASc Score and unadjusted Ischemic Stroke Rate (% per year) is equal to 2.2 % stroke rate/year from a score of 2  Saw Dr. Curt Bears for a fib management and if recurs then ablation.   Repeat echo in Oct with EF back to normal.  65-70% This would imply that he had a dissection and it has healed with return of function to normal.   Recent dislocation of Lt hip with closed reduction of hip 10/03/18  Today no complaints, discussed IVC filter and that it is fine to remove since he is on anticoagulation and will be.  No chest pain or SOB.  Reviewed his echo with him.  He continues with bouts of a fib, 3-4 times per week, last 10 min or so.  No acute issues with the episodes.  But is aware.    .  .     Past Medical History:  Diagnosis Date  . Arrhythmia   . Arthritis   . Asthma     as child  . Avascular necrosis of bone of left hip ([place])   . Back pain   . Blood clot in vein 06/2016   x 2 no bleeding disorders found  . Complication of anesthesia    ketamine hallucinations  . Coronary artery disease   . Dislocated hip, left, initial encounter ([place]) 10/03/2018  . GERD (gastroesophageal reflux disease)   . Hypertension   . Viral pericarditis 20 yrs ago    Past Surgical History:  Procedure Laterality Date  . ABDOMINAL EXPOSURE N/A 06/23/2016   Procedure: ABDOMINAL EXPOSURE;  Surgeon: Rosetta Posner, MD;  Location: [facility];  Service: Vascular;  Laterality: N/A;  . ANTERIOR LATERAL LUMBAR FUSION 4 LEVELS Right 06/23/2016   Procedure: Right  Lumbar two-three Lumbar three-four Lumbar four-five Anterolateral lumbar interbody fusion;  Surgeon: Erline Levine, MD;  Location: [facility];  Service: Neurosurgery;  Laterality: Right;  . ANTERIOR LUMBAR FUSION N/A 06/23/2016   Procedure: Lumbar five -sacral one  Anterior lumbar interbody fusion with Dr. Sherren Mocha Early for approach;  Surgeon: Erline Levine, MD;  Location: [facility];  Service: Neurosurgery;  Laterality: N/A;  . APPLICATION OF INTRAOPERATIVE CT SCAN N/A 06/26/2016   Procedure: APPLICATION OF INTRAOPERATIVE CT SCAN;  Surgeon: Erline Levine, MD;  Location: [facility];  Service: Neurosurgery;  Laterality: N/A;  . CARDIAC CATHETERIZATION    .  CARDIOVASCULAR STRESS TEST     Negative in May 2014  . COLONOSCOPY    . HERNIA REPAIR     umbilical  . HIP CLOSED REDUCTION Left 10/03/2018   Procedure: CLOSED REDUCTION HIP;  Surgeon: Rod Can, MD;  Location: [facility];  Service: Orthopedics;  Laterality: Left;  . IR IVC FILTER PLMT / S&I Burke Keels GUID/MOD SED  03/26/2018  . JOINT REPLACEMENT    . LEFT HEART CATH AND CORONARY ANGIOGRAPHY N/A 06/21/2018   Procedure: LEFT HEART CATH AND CORONARY ANGIOGRAPHY;  Surgeon: Jettie Booze, MD;  Location: [facility];  Service: Cardiovascular;  Laterality: N/A;  . POSTERIOR LUMBAR FUSION 4 LEVEL  N/A 06/26/2016   Procedure: Thoracic ten to Pelvis fixation with AIRO;  Surgeon: Erline Levine, MD;  Location: [facility];  Service: Neurosurgery;  Laterality: N/A;  . TENDON REPAIR     right hand  . TOTAL HIP ARTHROPLASTY Right 05/25/2014   Procedure: RIGHT TOTAL HIP ARTHROPLASTY ANTERIOR APPROACH;  Surgeon: Rod Can, MD;  Location: [facility];  Service: Orthopedics;  Laterality: Right;  . TOTAL HIP ARTHROPLASTY Left 03/27/2018   Procedure: TOTAL HIP ARTHROPLASTY ANTERIOR APPROACH- DA complex;  Surgeon: Rod Can, MD;  Location: [facility];  Service: Orthopedics;  Laterality: Left;  . TOTAL KNEE ARTHROPLASTY Bilateral      Current Outpatient Medications  Medication Sig Dispense Refill  . acetaminophen (TYLENOL) 325 MG tablet Take 650 mg by mouth every 6 (six) hours as needed for mild pain or headache.    . albuterol (VENTOLIN HFA) 108 (90 Base) MCG/ACT inhaler Inhale 1-2 puffs into the lungs as needed for wheezing or shortness of breath.    [name] apixaban (ELIQUIS) 5 MG TABS tablet Take 1 tablet (5 mg total) by mouth 2 (two) times daily. 60 tablet 6  . aspirin 81 MG chewable tablet Chew 81 mg by mouth daily.    [name] atorvastatin (LIPITOR) 80 MG tablet Take 1 tablet (80 mg total) by mouth daily at 6 PM. 90 tablet 3  . cyclobenzaprine (FLEXERIL) 10 MG tablet Take 10 mg by mouth as needed for muscle spasms (he takes daily at bedtime).     [name] docusate sodium (COLACE) 100 MG capsule Take 1 capsule (100 mg total) by mouth 2 (two) times daily. 60 capsule 1  . DULoxetine (CYMBALTA) 60 MG capsule Take 1 capsule (60 mg total) by mouth at bedtime. 90 capsule 3  . HYDROmorphone (DILAUDID) 4 MG tablet Take 1 tablet (4 mg total) by mouth every 4 (four) hours as needed for moderate pain or severe pain. 30 tablet 0  . lisinopril-hydrochlorothiazide (ZESTORETIC) 20-25 MG tablet Take 1 tablet by mouth daily. 90 tablet 3  . metoprolol succinate (TOPROL-XL) 100 MG 24 hr tablet Take 1 tablet (100 mg total) by mouth daily.  Take with or immediately following a meal. 90 tablet 3  . mupirocin ointment (BACTROBAN) 2 % as needed.    . nitroGLYCERIN (NITROSTAT) 0.4 MG SL tablet Place 1 tablet (0.4 mg total) under the tongue every 5 (five) minutes x 3 doses as needed for chest pain. If taking third dose, call 911. 25 tablet 3  . oxyCODONE (OXYCONTIN) 20 mg 12 hr tablet Take 1 tablet (20 mg total) by mouth 2 (two) times daily as needed (pain). 14 tablet 0  . polyethylene glycol (MIRALAX / GLYCOLAX) 17 g packet Take 17 g by mouth daily as needed for mild constipation.    . terbinafine (LAMISIL) 250 MG tablet Take 250 mg  by mouth daily.    [name] zolpidem (AMBIEN) 10 MG tablet Take 1 tablet (10 mg total) by mouth at bedtime as needed for sleep. 30 tablet 1  . metoprolol succinate (TOPROL-XL) 50 MG 24 hr tablet Take 1 tablet (50 mg total) by mouth daily. In addition to your 100mg  tablet. Take with or immediately following a meal. 90 tablet 3   No current facility-administered medications for this visit.     Allergies:   Ketamine and Morphine and related    Social History:  The patient  reports that he quit smoking about 20 years ago. His smoking use included cigarettes. He has a 25.00 pack-year smoking history. His smokeless tobacco use includes snuff. He reports current alcohol use of about 14.0 standard drinks of alcohol per week. He reports that he does not use drugs.   Family History:  The patient's family history includes Healthy in his daughter and daughter. He was adopted.    ROS:  General:no colds or fevers, no weight changes wt increase with brace on leg. Skin:no rashes or ulcers HEENT:no blurred vision, no congestion CV:see HPI PUL:see HPI GI:no diarrhea constipation or melena, no indigestion GU:no hematuria, no dysuria MS:no joint pain, no claudication Neuro:no syncope, no lightheadedness Endo:no diabetes, no thyroid disease  Wt Readings from Last 3 Encounters:  11/13/18 (!) 332 lb 12.8 oz (151 kg)   10/03/18 (!) 322 lb 15.6 oz (146.5 kg)  09/16/18 (!) 323 lb (146.5 kg)     PHYSICAL EXAM: VS:  BP 128/84   Pulse 84   Ht 6' (1.829 m)   Wt (!) 332 lb 12.8 oz (151 kg)   SpO2 93%   BMI 45.14 kg/m  , BMI Body mass index is 45.14 kg/m. General:Pleasant affect, NAD Skin:Warm and dry, brisk capillary refill HEENT:normocephalic, sclera clear, mucus membranes moist Neck:supple, no JVD, no bruits  Heart:S1S2 RRR without murmur, gallup, rub or click Lungs:clear without rales, rhonchi, or wheezes [id number], non tender, + BS, do not palpate liver spleen or masses Ext:no lower ext edema, 2+ pedal pulses, 2+ radial pulses Neuro:alert and oriented, MAE, follows commands, + facial symmetry    EKG:  EKG is ordered today. The ekg ordered today demonstrates SR Rt superior axis deviation.  Possible RVH no acute changes except increase of HR.   Recent Labs: 06/19/2018: ALT 32 06/20/2018: B Natriuretic Peptide 676.6; Magnesium 2.0; TSH 4.369 06/21/2018: BUN 13; Creatinine, Ser 0.99; Potassium 4.6; Sodium 138 06/22/2018: Hemoglobin 14.0; Platelets 300    Lipid Panel    Component Value Date/Time   CHOL 219 (H) 06/19/2018 1109   TRIG 81 06/19/2018 1109   HDL 75 06/19/2018 1109   CHOLHDL 2.9 06/19/2018 1109   VLDL 15 05/23/2012 0610   LDLCALC 126 (H) 06/19/2018 1109       Other studies Reviewed: Additional studies/ records that were reviewed today include:  Echo 09/30/18 . IMPRESSIONS    1. Left ventricular ejection fraction, by visual estimation, is 65 to 70%. The left ventricle has normal function. Normal left ventricular size. There is mildly increased left ventricular hypertrophy.  2. Definity contrast agent was given IV to delineate the left ventricular endocardial borders.  3. Left ventricular diastolic Doppler parameters are indeterminate pattern of LV diastolic filling.  4. Global right ventricle has normal systolic function.The right ventricular size is normal. No increase in  right ventricular wall thickness.  5. The left ventricular function has improved from previous echo  FINDINGS  Left Ventricle: Left ventricular ejection fraction, by  visual estimation, is 65 to 70%. The left ventricle has normal function. Definity contrast agent was given IV to delineate the left ventricular endocardial borders. There is mildly increased left  ventricular hypertrophy. Normal left ventricular size. Spectral Doppler shows Left ventricular diastolic Doppler parameters are indeterminate pattern of LV diastolic filling.  Right Ventricle: The right ventricular size is normal. No increase in right ventricular wall thickness. Global RV systolic function is has normal systolic function.  Left Atrium: Left atrial size was normal in size.  Right Atrium: Right atrial size was normal in size  Pericardium: There is no evidence of pericardial effusion.  Mitral Valve: The mitral valve is normal in structure. No evidence of mitral valve regurgitation.  Tricuspid Valve: The tricuspid valve is normal in structure. Tricuspid valve regurgitation is trivial by color flow Doppler.  Aortic Valve: The aortic valve is normal in structure. Aortic valve regurgitation was not visualized by color flow Doppler.  Pulmonic Valve: The pulmonic valve was grossly normal. Pulmonic valve regurgitation is not visualized by color flow Doppler.  Aorta: The aortic root is normal in size and structure.  Venous: The inferior vena cava is normal in size with greater than 50% respiratory variability, suggesting right atrial pressure of 3 mmHg.  IAS/Shunts: No atrial level shunt detected by color flow Doppler.    cardiac cath 06/21/18  Dist LAD lesion is 75% stenosed. 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  There is mild left ventricular systolic dysfunction.  The left ventricular ejection fraction is 35-45% by visual estimate.  There is no aortic valve stenosis.   No  atherosclerotic CAD.   SCAD at the apical LAD.   Hold anti coagulation for now.  Continue medical therapy.   Diagnostic Dominance: Right    ASSESSMENT AND PLAN:  1.  NSTEMI, SCAD of LAD now stable. Will check with Dr. Tamala Julian if ASA can be stopped.   No angina. Follow up with dr. Tamala Julian in 3 months.  2.  PAF still with brief episodes HR today 84 and we did EKG and HR 78 no acute changes. Discussed with Dr. Tamala Julian and we will increase toprol to 200 mg daily.. though if BP low or lightheadedness he will call and we will decrease. Follow up with Dr. Curt Bears.  Continue eliquis.    3.  Cardiomyopathy has resolved with normal EF.  4.  HLD on lipitor.  Continue  Will check lipid and hepatic in next few weeks.   5.  IVC filter - discussed with Dr. Tamala Julian and ok to remove, he will be on anticoagulation.  He will discuss with ortho.        Current medicines are reviewed with the patient today.  The patient Has no concerns regarding medicines.  The following changes have been made:  See above Labs/ tests ordered today include:see above  Disposition:   FU:  see above  Signed, Cecilie Kicks, NP  11/13/2018 4:05 PM    [facility] [place], [address] [place], [place] Phone: [phone]; Fax: [phone]

## 2018-11-14 NOTE — Addendum Note (Signed)
Addended by: Mendel Ryder on: 11/14/2018 02:28 PM   Modules accepted: Orders

## 2018-11-15 ENCOUNTER — Ambulatory Visit (HOSPITAL_COMMUNITY): Payer: 59

## 2018-11-15 ENCOUNTER — Other Ambulatory Visit: Payer: Self-pay | Admitting: *Deleted

## 2018-11-15 ENCOUNTER — Encounter: Payer: Self-pay | Admitting: Sports Medicine

## 2018-11-15 DIAGNOSIS — E785 Hyperlipidemia, unspecified: Secondary | ICD-10-CM

## 2018-11-18 ENCOUNTER — Ambulatory Visit (HOSPITAL_COMMUNITY): Payer: 59

## 2018-11-18 MED FILL — ZOLPIDEM TARTRATE 10 MG TAB: 10 | 30 days supply | Qty: 30 | Fill #1

## 2018-11-20 ENCOUNTER — Encounter: Payer: Self-pay | Admitting: Sports Medicine

## 2018-11-20 ENCOUNTER — Other Ambulatory Visit: Payer: 59

## 2018-11-20 ENCOUNTER — Ambulatory Visit (HOSPITAL_COMMUNITY): Payer: 59

## 2018-11-20 DIAGNOSIS — M48062 Spinal stenosis, lumbar region with neurogenic claudication: Secondary | ICD-10-CM

## 2018-11-20 DIAGNOSIS — M19011 Primary osteoarthritis, right shoulder: Secondary | ICD-10-CM

## 2018-11-20 DIAGNOSIS — E785 Hyperlipidemia, unspecified: Secondary | ICD-10-CM | POA: Diagnosis not present

## 2018-11-20 MED ORDER — OXYCODONE HCL ER 20 MG PO T12A: 20.0000 mg | EXTENDED_RELEASE_TABLET | Freq: Two times a day (BID) | ORAL | 0 refills | Status: DC | PRN

## 2018-11-20 MED FILL — FLUARIX QUADRIVALENT 0.5 ML: 0.5 | 1 days supply | Qty: 1 | Fill #0

## 2018-11-21 LAB — HEPATIC FUNCTION PANEL
AG Ratio: 1.6 (calc) (ref 1.0–2.5)
ALT: 30 U/L (ref 9–46)
AST: 30 U/L (ref 10–35)
Albumin: 4.2 g/dL (ref 3.6–5.1)
Alkaline phosphatase (APISO): 127 U/L (ref 35–144)
Bilirubin, Direct: 0.1 mg/dL (ref 0.0–0.2)
Globulin: 2.6 g/dL (calc) (ref 1.9–3.7)
Indirect Bilirubin: 0.3 mg/dL (calc) (ref 0.2–1.2)
Total Bilirubin: 0.4 mg/dL (ref 0.2–1.2)
Total Protein: 6.8 g/dL (ref 6.1–8.1)

## 2018-11-21 LAB — LIPID PANEL
Cholesterol: 148 mg/dL (ref <200)
HDL: 53 mg/dL (ref >=40)
LDL Cholesterol (Calc): 76 mg/dL (calc)
Non-HDL Cholesterol (Calc): 95 mg/dL (calc) (ref <130)
Total CHOL/HDL Ratio: 2.8 (calc) (ref <5.0)
Triglycerides: 111 mg/dL (ref <150)

## 2018-11-21 NOTE — Addendum Note (Signed)
Addended by: Silverio Decamp on: 11/21/2018 08:39 AM   Modules accepted: Orders

## 2018-11-22 ENCOUNTER — Ambulatory Visit (HOSPITAL_COMMUNITY): Payer: 59

## 2018-12-03 MED FILL — TERBINAFINE HCL 250 MG TAB: 250 | 90 days supply | Qty: 90 | Fill #1

## 2018-12-05 MED FILL — METOPROLOL SUCCINATE ER 100: 100 | 90 days supply | Qty: 180 | Fill #0

## 2018-12-06 ENCOUNTER — Other Ambulatory Visit: Payer: Self-pay | Admitting: Sports Medicine

## 2018-12-06 MED ORDER — HYDROCODONE-ACETAMINOPHEN 5-325 MG PO TABS: 1.0000 | ORAL_TABLET | Freq: Every day | ORAL | 0 refills | Status: DC

## 2018-12-06 MED FILL — ELIQUIS 5 MG TABLET: 5 | 30 days supply | Qty: 60 | Fill #0

## 2018-12-06 MED FILL — HYDROCODON-APAP 5-325: 5-325 | 7 days supply | Qty: 7 | Fill #0

## 2018-12-06 NOTE — Telephone Encounter (Signed)
Insurance is requiring yet another short acting opiate, adding 7 days of hydrocodone, I am really not even going to require the patient to consume this.

## 2018-12-09 ENCOUNTER — Ambulatory Visit (INDEPENDENT_AMBULATORY_CARE_PROVIDER_SITE_OTHER): Payer: 59 | Admitting: Cardiology

## 2018-12-09 ENCOUNTER — Encounter: Payer: Self-pay | Admitting: Cardiology

## 2018-12-09 ENCOUNTER — Other Ambulatory Visit: Payer: Self-pay

## 2018-12-09 VITALS — BP 126/88 | HR 68 | Ht 72.0 in | Wt 333.0 lb

## 2018-12-09 DIAGNOSIS — I480 Paroxysmal atrial fibrillation: Secondary | ICD-10-CM | POA: Diagnosis not present

## 2018-12-09 NOTE — Progress Notes (Signed)
Electrophysiology Office Note   Date:  12/09/2018   ID:  Travis Huff, Travis Huff 1960/01/23, MRN [id number]  PCP:  Silverio Decamp, MD  Cardiologist: Tamala Julian Primary Electrophysiologist:  Amonte Brookover Meredith Leeds, MD    No chief complaint on file.    History of Present Illness: Travis Huff is a 58 y.o. male who is being seen today for the evaluation of atrial fibrillation at the request of Silverio Decamp Presenting today for electrophysiology evaluation.  He has a history significant for anxiety, hypertension, obesity, multiple PEs on Eliquis status post IVC filter, non-STEMI due to coronary artery dissection, PAF, and chronic combined systolic and diastolic heart failure due to ischemic cardiomyopathy.  He had a recent hospitalization for palpitations, weakness, and syncope.  He presented to his primary physician's office and was found to have an elevated troponin on labs.  His primary physician called him and advised him to go to the hospital.  Hospital work-up identified apical narrowing of the LAD which was due to coronary dissection.  He wore a cardiac monitor for evaluation of near syncope which found rapid atrial fibrillation.  His metoprolol dose was increased.  Today, denies symptoms of palpitations, chest pain, shortness of breath, orthopnea, PND, lower extremity edema, claudication, dizziness, presyncope, syncope, bleeding, or neurologic sequela. The patient is tolerating medications without difficulties.  Overall he is doing well.  He had an echo done in September 2020 that showed a normalization of his ejection fraction.  He is having 5 minutes of palpitations a few times a week, but otherwise notes no episodes of atrial fibrillation.   Past Medical History:  Diagnosis Date  . Arrhythmia   . Arthritis   . Asthma    as child  . Avascular necrosis of bone of left hip ([place])   . Back pain   . Blood clot in vein 06/2016   x 2 no bleeding disorders found  .  Complication of anesthesia    ketamine hallucinations  . Coronary artery disease   . Dislocated hip, left, initial encounter ([place]) 10/03/2018  . GERD (gastroesophageal reflux disease)   . Hypertension   . PAF (paroxysmal atrial fibrillation) ([place])   . Viral pericarditis 20 yrs ago   Past Surgical History:  Procedure Laterality Date  . ABDOMINAL EXPOSURE N/A 06/23/2016   Procedure: ABDOMINAL EXPOSURE;  Surgeon: Rosetta Posner, MD;  Location: [facility];  Service: Vascular;  Laterality: N/A;  . ANTERIOR LATERAL LUMBAR FUSION 4 LEVELS Right 06/23/2016   Procedure: Right  Lumbar two-three Lumbar three-four Lumbar four-five Anterolateral lumbar interbody fusion;  Surgeon: Erline Levine, MD;  Location: [facility];  Service: Neurosurgery;  Laterality: Right;  . ANTERIOR LUMBAR FUSION N/A 06/23/2016   Procedure: Lumbar five -sacral one  Anterior lumbar interbody fusion with Dr. Sherren Mocha Early for approach;  Surgeon: Erline Levine, MD;  Location: [facility];  Service: Neurosurgery;  Laterality: N/A;  . APPLICATION OF INTRAOPERATIVE CT SCAN N/A 06/26/2016   Procedure: APPLICATION OF INTRAOPERATIVE CT SCAN;  Surgeon: Erline Levine, MD;  Location: [facility];  Service: Neurosurgery;  Laterality: N/A;  . CARDIAC CATHETERIZATION    . CARDIOVASCULAR STRESS TEST     Negative in May 2014  . COLONOSCOPY    . HERNIA REPAIR     umbilical  . HIP CLOSED REDUCTION Left 10/03/2018   Procedure: CLOSED REDUCTION HIP;  Surgeon: Rod Can, MD;  Location: [facility];  Service: Orthopedics;  Laterality: Left;  . IR IVC FILTER PLMT / S&I Burke Keels  GUID/MOD SED  03/26/2018  . JOINT REPLACEMENT    . LEFT HEART CATH AND CORONARY ANGIOGRAPHY N/A 06/21/2018   Procedure: LEFT HEART CATH AND CORONARY ANGIOGRAPHY;  Surgeon: Jettie Booze, MD;  Location: [facility];  Service: Cardiovascular;  Laterality: N/A;  . POSTERIOR LUMBAR FUSION 4 LEVEL N/A 06/26/2016   Procedure: Thoracic ten to Pelvis fixation with AIRO;  Surgeon: Erline Levine, MD;   Location: [facility];  Service: Neurosurgery;  Laterality: N/A;  . TENDON REPAIR     right hand  . TOTAL HIP ARTHROPLASTY Right 05/25/2014   Procedure: RIGHT TOTAL HIP ARTHROPLASTY ANTERIOR APPROACH;  Surgeon: Rod Can, MD;  Location: [facility];  Service: Orthopedics;  Laterality: Right;  . TOTAL HIP ARTHROPLASTY Left 03/27/2018   Procedure: TOTAL HIP ARTHROPLASTY ANTERIOR APPROACH- DA complex;  Surgeon: Rod Can, MD;  Location: [facility];  Service: Orthopedics;  Laterality: Left;  . TOTAL KNEE ARTHROPLASTY Bilateral      Current Outpatient Medications  Medication Sig Dispense Refill  . acetaminophen (TYLENOL) 325 MG tablet Take 650 mg by mouth every 6 (six) hours as needed for mild pain or headache.    [name] apixaban (ELIQUIS) 5 MG TABS tablet Take 1 tablet (5 mg total) by mouth 2 (two) times daily. 60 tablet 6  . atorvastatin (LIPITOR) 80 MG tablet Take 1 tablet (80 mg total) by mouth daily at 6 PM. 90 tablet 3  . cyclobenzaprine (FLEXERIL) 10 MG tablet Take 10 mg by mouth as needed for muscle spasms (he takes daily at bedtime).     [name] docusate sodium (COLACE) 100 MG capsule Take 1 capsule (100 mg total) by mouth 2 (two) times daily. 60 capsule 1  . DULoxetine (CYMBALTA) 60 MG capsule Take 1 capsule (60 mg total) by mouth at bedtime. 90 capsule 3  . HYDROmorphone (DILAUDID) 4 MG tablet Take 1 tablet (4 mg total) by mouth every 4 (four) hours as needed for moderate pain or severe pain. 30 tablet 0  . lisinopril-hydrochlorothiazide (ZESTORETIC) 20-25 MG tablet Take 1 tablet by mouth daily. 90 tablet 3  . metoprolol succinate (TOPROL-XL) 100 MG 24 hr tablet Take 2 tablets (200 mg total) by mouth daily. Take 2 tablets by mouth in the morning 180 tablet 3  . mupirocin ointment (BACTROBAN) 2 % as needed.    . nitroGLYCERIN (NITROSTAT) 0.4 MG SL tablet Place 1 tablet (0.4 mg total) under the tongue every 5 (five) minutes x 3 doses as needed for chest pain. If taking third dose, call 911. 25 tablet 3  .  oxyCODONE (OXYCONTIN) 20 mg 12 hr tablet Take 1 tablet (20 mg total) by mouth 2 (two) times daily as needed (pain). 60 tablet 0  . polyethylene glycol (MIRALAX / GLYCOLAX) 17 g packet Take 17 g by mouth daily as needed for mild constipation.    . terbinafine (LAMISIL) 250 MG tablet Take 250 mg by mouth daily.    [name] zolpidem (AMBIEN) 10 MG tablet Take 1 tablet (10 mg total) by mouth at bedtime as needed for sleep. 30 tablet 1   No current facility-administered medications for this visit.     Allergies:   Ketamine and Morphine and related   Social History:  The patient  reports that he quit smoking about 20 years ago. His smoking use included cigarettes. He has a 25.00 pack-year smoking history. His smokeless tobacco use includes snuff. He reports current alcohol use of about 14.0 standard drinks of alcohol per week. He reports that  he does not use drugs.   Family History:  The patient's family history includes Healthy in his daughter and daughter. He was adopted.    ROS:  Please see the history of present illness.   Otherwise, review of systems is positive for none.   All other systems are reviewed and negative.   PHYSICAL EXAM: VS:  BP 126/88   Pulse 68   Ht 6' (1.829 m)   Wt (!) 333 lb (151 kg)   SpO2 99%   BMI 45.16 kg/m  , BMI Body mass index is 45.16 kg/m. GEN: Well nourished, well developed, in no acute distress  HEENT: normal  Neck: no JVD, carotid bruits, or masses Cardiac: RRR; no murmurs, rubs, or gallops,no edema  Respiratory:  clear to auscultation bilaterally, normal work of breathing GI: soft, nontender, nondistended, + BS MS: no deformity or atrophy  Skin: warm and dry Neuro:  Strength and sensation are intact Psych: euthymic mood, full affect  EKG:  EKG is ordered today. Personal review of the ekg ordered shows sinus rhythm, right superior axis  Recent Labs: 06/20/2018: B Natriuretic Peptide 676.6; Magnesium 2.0; TSH 4.369 06/21/2018: BUN 13; Creatinine, Ser  0.99; Potassium 4.6; Sodium 138 06/22/2018: Hemoglobin 14.0; Platelets 300 11/20/2018: ALT 30    Lipid Panel     Component Value Date/Time   CHOL 148 11/20/2018 1147   TRIG 111 11/20/2018 1147   HDL 53 11/20/2018 1147   CHOLHDL 2.8 11/20/2018 1147   VLDL 15 05/23/2012 0610   LDLCALC 76 11/20/2018 1147     Wt Readings from Last 3 Encounters:  12/09/18 (!) 333 lb (151 kg)  11/13/18 (!) 332 lb 12.8 oz (151 kg)  10/03/18 (!) 322 lb 15.6 oz (146.5 kg)      Other studies Reviewed: Additional studies/ records that were reviewed today include: TTE 09/30/2018 Review of the above records today demonstrates:   1. Left ventricular ejection fraction, by visual estimation, is 65 to 70%. The left ventricle has normal function. Normal left ventricular size. There is mildly increased left ventricular hypertrophy.  2. Definity contrast agent was given IV to delineate the left ventricular endocardial borders.  3. Left ventricular diastolic Doppler parameters are indeterminate pattern of LV diastolic filling.  4. Global right ventricle has normal systolic function.The right ventricular size is normal. No increase in right ventricular wall thickness.  5. The left ventricular function has improved from previous echo  [facility] 06/21/18  Dist LAD lesion is 75% stenosed. 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  There is mild left ventricular systolic dysfunction.  The left ventricular ejection fraction is 35-45% by visual estimate.  There is no aortic valve stenosis.    ASSESSMENT AND PLAN:  1.  Paroxysmal atrial fibrillation: Currently on Eliquis.  CHA2DS2-VASc of 2.  He is having minimal atrial fibrillation.  No changes at this time.  2.  Coronary artery disease: 3 of LAD dissection.  No current chest pain.  Continue per primary cardiology.  3.  Ischemic cardiomyopathy: Repeat echo shows normalization of his ejection fraction.  At this point, he does not need a defibrillator.   No changes.  Plan per primary cardiology.    Current medicines are reviewed at length with the patient today.   The patient does not have concerns regarding his medicines.  The following changes were made today: None  Labs/ tests ordered today include:  No orders of the defined types were placed in this encounter.   Disposition:   FU  with Trypp Heckmann 12 months  Signed, Novie Maggio Meredith Leeds, MD  12/09/2018 3:14 PM     [address] [place] [address] [phone] (office) [phone] (fax)

## 2018-12-09 NOTE — Patient Instructions (Signed)
Medication Instructions:  Your physician recommends that you continue on your current medications as directed. Please refer to the Current Medication list given to you today.  * If you need a refill on your cardiac medications before your next appointment, please call your pharmacy.   Labwork: None ordered  Testing/Procedures: None ordered  Follow-Up: Your physician wants you to follow-up in: 1 year with Dr. Camnitz.  You will receive a reminder letter in the mail two months in advance. If you don't receive a letter, please call our office to schedule the follow-up appointment.  Thank you for choosing [facility]!!   Sofie Schendel, RN [phone]        

## 2018-12-20 ENCOUNTER — Other Ambulatory Visit: Payer: Self-pay | Admitting: Sports Medicine

## 2018-12-20 DIAGNOSIS — F5101 Primary insomnia: Secondary | ICD-10-CM

## 2018-12-20 MED FILL — ZOLPIDEM TARTRATE 10 MG TAB: 10 | 30 days supply | Qty: 30 | Fill #0

## 2018-12-26 MED FILL — DULOXETINE HCL 60 MG CPEP: 60 | 90 days supply | Qty: 90 | Fill #3

## 2019-01-13 MED FILL — ELIQUIS 5 MG TABLET: 5 | 30 days supply | Qty: 60 | Fill #0

## 2019-01-13 MED FILL — LISINOPRIL-HCTZ 20-25 MG TA: 20-25 | 90 days supply | Qty: 90 | Fill #0

## 2019-01-20 MED FILL — ATORVASTATIN 80 MG TABLET: 80 | 90 days supply | Qty: 90 | Fill #2

## 2019-01-22 ENCOUNTER — Ambulatory Visit (INDEPENDENT_AMBULATORY_CARE_PROVIDER_SITE_OTHER): Payer: 59

## 2019-01-22 ENCOUNTER — Other Ambulatory Visit: Payer: Self-pay

## 2019-01-22 ENCOUNTER — Ambulatory Visit (INDEPENDENT_AMBULATORY_CARE_PROVIDER_SITE_OTHER): Payer: 59 | Admitting: Sports Medicine

## 2019-01-22 DIAGNOSIS — M11269 Other chondrocalcinosis, unspecified knee: Secondary | ICD-10-CM | POA: Diagnosis not present

## 2019-01-22 DIAGNOSIS — Z96653 Presence of artificial knee joint, bilateral: Secondary | ICD-10-CM

## 2019-01-22 DIAGNOSIS — Z96652 Presence of left artificial knee joint: Secondary | ICD-10-CM | POA: Diagnosis not present

## 2019-01-22 DIAGNOSIS — Z471 Aftercare following joint replacement surgery: Secondary | ICD-10-CM | POA: Diagnosis not present

## 2019-01-22 NOTE — Progress Notes (Addendum)
    Procedures performed today:    Procedure: Real-time Ultrasound Guided  aspiration of left knee Device: Samsung HS60  Verbal informed consent obtained.  Time-out conducted.  Noted no overlying erythema, induration, or other signs of local infection.  Skin prepped in a sterile fashion.  Local anesthesia: Topical Ethyl chloride.  With sterile technique and under real time ultrasound guidance:  Using a an 18-gauge needle aspirated approximately 38 cc of blood.   Completed without difficulty  Pain immediately resolved suggesting accurate placement of the medication.  Advised to call if fevers/chills, erythema, induration, drainage, or persistent bleeding.  Images permanently stored and available for review in the ultrasound unit.  Impression: Technically successful ultrasound guided injection.  Independent interpretation of tests performed by another provider:   None.  Impression and Recommendations:    History of bilateral knee arthroplasty Severe increasing pain on the left. No major trauma. Aspiration, crystal analysis and cultures. X-rays. He will need to touch base with Dr. Maureen Ralphs for further management.    ___________________________________________ Gwen Her. Dianah Field, M.D., ABFM., CAQSM. Primary Care and [name] Instructor of [employer] [facility] [employer]

## 2019-01-22 NOTE — Assessment & Plan Note (Addendum)
Severe increasing pain on the left. No major trauma. Aspiration, crystal analysis and cultures. X-rays. He will need to touch base with Dr. Maureen Ralphs for further management.  Noted calcium pyrophosphate crystals suggesting the diagnosis is pseudogout, we can use colchicine for flares.

## 2019-01-23 DIAGNOSIS — Z96652 Presence of left artificial knee joint: Secondary | ICD-10-CM | POA: Diagnosis not present

## 2019-01-23 DIAGNOSIS — M79661 Pain in right lower leg: Secondary | ICD-10-CM | POA: Diagnosis not present

## 2019-01-23 DIAGNOSIS — M79604 Pain in right leg: Secondary | ICD-10-CM | POA: Diagnosis not present

## 2019-01-23 DIAGNOSIS — M25562 Pain in left knee: Secondary | ICD-10-CM | POA: Diagnosis not present

## 2019-01-23 DIAGNOSIS — M79662 Pain in left lower leg: Secondary | ICD-10-CM | POA: Diagnosis not present

## 2019-01-23 DIAGNOSIS — M79605 Pain in left leg: Secondary | ICD-10-CM | POA: Diagnosis not present

## 2019-01-23 LAB — TIQ- MISLABELED
Test Ordered On Req: 4446
Test Ordered On Req: 4563

## 2019-01-24 ENCOUNTER — Other Ambulatory Visit: Payer: Self-pay

## 2019-01-24 ENCOUNTER — Other Ambulatory Visit (HOSPITAL_COMMUNITY): Payer: Self-pay | Admitting: Orthopedic Surgery

## 2019-01-24 ENCOUNTER — Ambulatory Visit (HOSPITAL_COMMUNITY)
Admission: RE | Admit: 2019-01-24 | Discharge: 2019-01-24 | Disposition: A | Discharge status: Home / Self Care | Payer: 59 | Source: Ambulatory Visit | Attending: Cardiology | Admitting: Cardiology

## 2019-01-24 DIAGNOSIS — M7989 Other specified soft tissue disorders: Secondary | ICD-10-CM

## 2019-01-24 DIAGNOSIS — M79662 Pain in left lower leg: Secondary | ICD-10-CM | POA: Insufficient documentation

## 2019-01-27 DIAGNOSIS — M11269 Other chondrocalcinosis, unspecified knee: Secondary | ICD-10-CM | POA: Insufficient documentation

## 2019-01-27 DIAGNOSIS — M25562 Pain in left knee: Secondary | ICD-10-CM | POA: Diagnosis not present

## 2019-01-27 LAB — SYNOVIAL FLUID, CRYSTAL

## 2019-01-27 LAB — PAT ID TIQ DOC: Test Affected: 4563

## 2019-01-27 NOTE — Assessment & Plan Note (Signed)
Noted calcium pyrophosphate crystals suggesting the diagnosis is pseudogout, we can use colchicine for flares.

## 2019-02-03 DIAGNOSIS — R2242 Localized swelling, mass and lump, left lower limb: Secondary | ICD-10-CM | POA: Diagnosis not present

## 2019-02-03 DIAGNOSIS — M79605 Pain in left leg: Secondary | ICD-10-CM | POA: Diagnosis not present

## 2019-02-04 DIAGNOSIS — Z471 Aftercare following joint replacement surgery: Secondary | ICD-10-CM | POA: Diagnosis not present

## 2019-02-04 DIAGNOSIS — Z96652 Presence of left artificial knee joint: Secondary | ICD-10-CM | POA: Diagnosis not present

## 2019-02-10 ENCOUNTER — Other Ambulatory Visit: Payer: Self-pay | Admitting: Sports Medicine

## 2019-02-10 MED FILL — CYCLOBENZAPRINE HCL 10 MG T: 10 | 23 days supply | Qty: 360 | Fill #0

## 2019-02-10 MED FILL — ELIQUIS 5 MG TABLET: 5 | 30 days supply | Qty: 60 | Fill #0

## 2019-02-20 NOTE — Progress Notes (Signed)
Cardiology Office Note:    Date:  02/21/2019   ID:  [name], DOB Jan 07, 1960, MRN [id number]  PCP:  Silverio Decamp, MD  Cardiologist:  Sinclair Grooms, MD   Referring MD: Silverio Decamp   Chief Complaint  Patient presents with  . Coronary Artery Disease  . Atrial Fibrillation    History of Present Illness:    DANNIEL HAZEL is a 59 y.o. male with a hx of  w/anxiety, HTN, obesity, multiple PE's (on apixaban and s/p IVC filter prior to orthopedic surgery--> filter to be removed), NSTEMI, probable CA dissection, PAF documented on OP monitor, and chronic combined diastolic and systolic HF due to ischemic cardiomyopathy (EF 40-45%).  Rogen is doing well.  He denies angina.  He has had brief recurrences of atrial fibrillation.  Atrial for of is frequently associated by lightheadedness and near fainting.  This is been rare.  It seems to be precipitated by physical activity.  He has had no recurrence of chest pain.  He has seen Dr. Curt Bears.  Discussion concerning ablation occurred.  Currently watchful waiting is being observed.  Past Medical History:  Diagnosis Date  . Arrhythmia   . Arthritis   . Asthma    as child  . Avascular necrosis of bone of left hip ([place])   . Back pain   . Blood clot in vein 06/2016   x 2 no bleeding disorders found  . Complication of anesthesia    ketamine hallucinations  . Coronary artery disease   . Dislocated hip, left, initial encounter ([place]) 10/03/2018  . GERD (gastroesophageal reflux disease)   . Hypertension   . PAF (paroxysmal atrial fibrillation) ([place])   . Viral pericarditis 20 yrs ago    Past Surgical History:  Procedure Laterality Date  . ABDOMINAL EXPOSURE N/A 06/23/2016   Procedure: ABDOMINAL EXPOSURE;  Surgeon: Rosetta Posner, MD;  Location: [facility];  Service: Vascular;  Laterality: N/A;  . ANTERIOR LATERAL LUMBAR FUSION 4 LEVELS Right 06/23/2016   Procedure: Right  Lumbar two-three Lumbar three-four Lumbar  four-five Anterolateral lumbar interbody fusion;  Surgeon: Erline Levine, MD;  Location: [facility];  Service: Neurosurgery;  Laterality: Right;  . ANTERIOR LUMBAR FUSION N/A 06/23/2016   Procedure: Lumbar five -sacral one  Anterior lumbar interbody fusion with Dr. Sherren Mocha Early for approach;  Surgeon: Erline Levine, MD;  Location: [facility];  Service: Neurosurgery;  Laterality: N/A;  . APPLICATION OF INTRAOPERATIVE CT SCAN N/A 06/26/2016   Procedure: APPLICATION OF INTRAOPERATIVE CT SCAN;  Surgeon: Erline Levine, MD;  Location: [facility];  Service: Neurosurgery;  Laterality: N/A;  . CARDIAC CATHETERIZATION    . CARDIOVASCULAR STRESS TEST     Negative in May 2014  . COLONOSCOPY    . HERNIA REPAIR     umbilical  . HIP CLOSED REDUCTION Left 10/03/2018   Procedure: CLOSED REDUCTION HIP;  Surgeon: Rod Can, MD;  Location: [facility];  Service: Orthopedics;  Laterality: Left;  . IR IVC FILTER PLMT / S&I Burke Keels GUID/MOD SED  03/26/2018  . JOINT REPLACEMENT    . LEFT HEART CATH AND CORONARY ANGIOGRAPHY N/A 06/21/2018   Procedure: LEFT HEART CATH AND CORONARY ANGIOGRAPHY;  Surgeon: Jettie Booze, MD;  Location: [facility];  Service: Cardiovascular;  Laterality: N/A;  . POSTERIOR LUMBAR FUSION 4 LEVEL N/A 06/26/2016   Procedure: Thoracic ten to Pelvis fixation with AIRO;  Surgeon: Erline Levine, MD;  Location: [facility];  Service: Neurosurgery;  Laterality: N/A;  .  TENDON REPAIR     right hand  . TOTAL HIP ARTHROPLASTY Right 05/25/2014   Procedure: RIGHT TOTAL HIP ARTHROPLASTY ANTERIOR APPROACH;  Surgeon: Rod Can, MD;  Location: [facility];  Service: Orthopedics;  Laterality: Right;  . TOTAL HIP ARTHROPLASTY Left 03/27/2018   Procedure: TOTAL HIP ARTHROPLASTY ANTERIOR APPROACH- DA complex;  Surgeon: Rod Can, MD;  Location: [facility];  Service: Orthopedics;  Laterality: Left;  . TOTAL KNEE ARTHROPLASTY Bilateral     Current Medications: Current Meds  Medication Sig  . acetaminophen (TYLENOL) 325 MG  tablet Take 650 mg by mouth every 6 (six) hours as needed for mild pain or headache.  [name] apixaban (ELIQUIS) 5 MG TABS tablet Take 1 tablet (5 mg total) by mouth 2 (two) times daily.  [name] atorvastatin (LIPITOR) 80 MG tablet Take 1 tablet (80 mg total) by mouth daily at 6 PM.  . cyclobenzaprine (FLEXERIL) 10 MG tablet TAKE 2 TABLETS (20 MG TOTAL) BY MOUTH 2 (TWO) TIMES DAILY.  [name] docusate sodium (COLACE) 100 MG capsule Take 1 capsule (100 mg total) by mouth 2 (two) times daily.  . DULoxetine (CYMBALTA) 60 MG capsule Take 1 capsule (60 mg total) by mouth at bedtime.  [name] HYDROmorphone (DILAUDID) 4 MG tablet Take 1 tablet (4 mg total) by mouth every 4 (four) hours as needed for moderate pain or severe pain.  [name] lisinopril-hydrochlorothiazide (ZESTORETIC) 20-25 MG tablet Take 1 tablet by mouth daily.  . metoprolol succinate (TOPROL-XL) 100 MG 24 hr tablet Take 2 tablets (200 mg total) by mouth daily. Take 2 tablets by mouth in the morning  . nitroGLYCERIN (NITROSTAT) 0.4 MG SL tablet Place 1 tablet (0.4 mg total) under the tongue every 5 (five) minutes x 3 doses as needed for chest pain. If taking third dose, call 911.  . polyethylene glycol (MIRALAX / GLYCOLAX) 17 g packet Take 17 g by mouth daily as needed for mild constipation.  . terbinafine (LAMISIL) 250 MG tablet Take 250 mg by mouth daily.  [name] zolpidem (AMBIEN) 10 MG tablet TAKE 1 TABLET (10 MG TOTAL) BY MOUTH AT BEDTIME AS NEEDED FOR SLEEP.     Allergies:   Ketamine and Morphine and related   Social History   Socioeconomic History  . Marital status: Married    Spouse name: [name]  . Number of children: 2  . Years of education: 52  . Highest education level: Not on file  Occupational History  . Occupation: Public relations account executive  Tobacco Use  . Smoking status: Former Smoker    Packs/day: 1.00    Years: 25.00    Pack years: 25.00    Types: Cigarettes    Quit date: 2000    Years since quitting: 21.1  . Smokeless tobacco: Current User    Types:  Snuff  Substance and Sexual Activity  . Alcohol use: Yes    Alcohol/week: 14.0 standard drinks    Types: 14 Cans of beer per week    Comment: 2 beers QD  . Drug use: No  . Sexual activity: Not on file  Other Topics Concern  . Not on file  Social History Narrative   Lives with wife in a one story home.  Has 2 children.  Works as a Public relations account executive.  Education: high school.    Social Determinants of Health   Financial Resource Strain:   . Difficulty of Paying Living Expenses: Not on file  Food Insecurity: No Food Insecurity  . Worried About Charity fundraiser in the  Last Year: Never true  . Ran Out of Food in the Last Year: Never true  Transportation Needs: No Transportation Needs  . Lack of Transportation (Medical): No  . Lack of Transportation (Non-Medical): No  Physical Activity: Inactive  . Days of Exercise per Week: 0 days  . Minutes of Exercise per Session: 0 min  Stress: Stress Concern Present  . Feeling of Stress : To some extent  Social Connections:   . Frequency of Communication with Friends and Family: Not on file  . Frequency of Social Gatherings with Friends and Family: Not on file  . Attends Religious Services: Not on file  . Active Member of Clubs or Organizations: Not on file  . Attends Archivist Meetings: Not on file  . Marital Status: Not on file     Family History: The patient's family history includes Healthy in his daughter and daughter. He was adopted.  ROS:   Please see the history of present illness.    He has had DVT.  He still has an Industrial/product designer in place.  He is on anticoagulation therapy, apixaban 5 mg twice daily.  All other systems reviewed and are negative.  EKGs/Labs/Other Studies Reviewed:    The following studies were reviewed today:  ECHOCARDIOGRAM 09/30/2018: IMPRESSIONS    1. Left ventricular ejection fraction, by visual estimation, is 65 to  70%. The left ventricle has normal function. Normal left ventricular size.  There  is mildly increased left ventricular hypertrophy.  2. Definity contrast agent was given IV to delineate the left ventricular  endocardial borders.  3. Left ventricular diastolic Doppler parameters are indeterminate  pattern of LV diastolic filling.  4. Global right ventricle has normal systolic function.The right  ventricular size is normal. No increase in right ventricular wall  thickness.  5. The left ventricular function has improved from previous echo   EKG:  EKG none  Recent Labs: 06/20/2018: B Natriuretic Peptide 676.6; Magnesium 2.0; TSH 4.369 06/21/2018: BUN 13; Creatinine, Ser 0.99; Potassium 4.6; Sodium 138 06/22/2018: Hemoglobin 14.0; Platelets 300 11/20/2018: ALT 30  Recent Lipid Panel    Component Value Date/Time   CHOL 148 11/20/2018 1147   TRIG 111 11/20/2018 1147   HDL 53 11/20/2018 1147   CHOLHDL 2.8 11/20/2018 1147   VLDL 15 05/23/2012 0610   LDLCALC 76 11/20/2018 1147    Physical Exam:    VS:  BP (!) 142/88   Pulse 80   Ht 6' (1.829 m)   Wt (!) 342 lb 9.6 oz (155.4 kg)   SpO2 95%   BMI 46.46 kg/m     Wt Readings from Last 3 Encounters:  02/21/19 (!) 342 lb 9.6 oz (155.4 kg)  12/09/18 (!) 333 lb (151 kg)  11/13/18 (!) 332 lb 12.8 oz (151 kg)     GEN: Obese. No acute distress HEENT: Normal NECK: No JVD. LYMPHATICS: No lymphadenopathy CARDIAC:  RRR without murmur, gallop, or edema. VASCULAR:  Normal Pulses. No bruits. RESPIRATORY:  Clear to auscultation without rales, wheezing or rhonchi  ABDOMEN: Soft, non-tender, non-distended, No pulsatile mass, MUSCULOSKELETAL: No deformity  SKIN: Warm and dry NEUROLOGIC:  Alert and oriented x 3 PSYCHIATRIC:  Normal affect   ASSESSMENT:    1. Paroxysmal atrial fibrillation (HCC)   2. Hyperlipidemia, unspecified hyperlipidemia type   3. Coronary artery disease involving native coronary artery of native heart without angina pectoris   4. Chronic anticoagulation   5. Intermittent claudication ([place])     6. Educated about COVID-19 virus  infection    PLAN:    In order of problems listed above:  1. He is tolerating infrequent brief episodes of atrial for ablation.  Not yet ready to commit ablation. 2. Risk factor modification with statin therapy is important. 3. I am suspicious that his coronary event was SCAD (this is rare in males).  With improvement in LV function, the syndrome is possible versus Takotsubo. 4. Continue apixaban 5. No complaints.  Risk factor modification. 6. Encourage the COVID-19 vaccine.  He is still on the fence.  He is practicing social distancing.   Medication Adjustments/Labs and Tests Ordered: Current medicines are reviewed at length with the patient today.  Concerns regarding medicines are outlined above.  No orders of the defined types were placed in this encounter.  No orders of the defined types were placed in this encounter.   Patient Instructions  Medication Instructions:  Your physician recommends that you continue on your current medications as directed. Please refer to the Current Medication list given to you today.  *If you need a refill on your cardiac medications before your next appointment, please call your pharmacy*  Lab Work: None If you have labs (blood work) drawn today and your tests are completely normal, you will receive your results only by: [name] MyChart Message (if you have MyChart) OR . A paper copy in the mail If you have any lab test that is abnormal or we need to change your treatment, we will call you to review the results.  Testing/Procedures: None  Follow-Up: At [facility], you and your health needs are our priority.  As part of our continuing mission to provide you with exceptional heart care, we have created designated Provider Care Teams.  These Care Teams include your primary Cardiologist (physician) and Advanced Practice Providers (APPs -  Physician Assistants and Nurse Practitioners) who all work together to provide  you with the care you need, when you need it.  Your next appointment:   6-9 month(s)  The format for your next appointment:   In Person  Provider:   You may see Sinclair Grooms, MD or one of the following Advanced Practice Providers on your designated Care Team:    Truitt Merle, NP  Cecilie Kicks, NP  Kathyrn Drown, NP   Other Instructions      Signed, Sinclair Grooms, MD  02/21/2019 4:17 PM    [place]

## 2019-02-21 ENCOUNTER — Ambulatory Visit: Payer: 59 | Admitting: Interventional Cardiology

## 2019-02-21 ENCOUNTER — Other Ambulatory Visit: Payer: Self-pay

## 2019-02-21 ENCOUNTER — Encounter: Payer: Self-pay | Admitting: Interventional Cardiology

## 2019-02-21 VITALS — BP 142/88 | HR 80 | Ht 72.0 in | Wt 342.6 lb

## 2019-02-21 DIAGNOSIS — E785 Hyperlipidemia, unspecified: Secondary | ICD-10-CM

## 2019-02-21 DIAGNOSIS — I480 Paroxysmal atrial fibrillation: Secondary | ICD-10-CM

## 2019-02-21 DIAGNOSIS — I2510 Atherosclerotic heart disease of native coronary artery without angina pectoris: Secondary | ICD-10-CM

## 2019-02-21 DIAGNOSIS — I739 Peripheral vascular disease, unspecified: Secondary | ICD-10-CM | POA: Diagnosis not present

## 2019-02-21 DIAGNOSIS — Z7901 Long term (current) use of anticoagulants: Secondary | ICD-10-CM | POA: Diagnosis not present

## 2019-02-21 DIAGNOSIS — Z7189 Other specified counseling: Secondary | ICD-10-CM

## 2019-02-21 NOTE — Patient Instructions (Signed)
Medication Instructions:  Your physician recommends that you continue on your current medications as directed. Please refer to the Current Medication list given to you today.  *If you need a refill on your cardiac medications before your next appointment, please call your pharmacy*  Lab Work: None If you have labs (blood work) drawn today and your tests are completely normal, you will receive your results only by: . MyChart Message (if you have MyChart) OR . A paper copy in the mail If you have any lab test that is abnormal or we need to change your treatment, we will call you to review the results.  Testing/Procedures: None  Follow-Up: At [facility], you and your health needs are our priority.  As part of our continuing mission to provide you with exceptional heart care, we have created designated Provider Care Teams.  These Care Teams include your primary Cardiologist (physician) and Advanced Practice Providers (APPs -  Physician Assistants and Nurse Practitioners) who all work together to provide you with the care you need, when you need it.  Your next appointment:   6-9 month(s)  The format for your next appointment:   In Person  Provider:   You may see Henry W Smith III, MD or one of the following Advanced Practice Providers on your designated Care Team:    Lori Gerhardt, NP  Laura Ingold, NP  Jill McDaniel, NP   Other Instructions   

## 2019-02-25 LAB — ANAEROBIC AND AEROBIC CULTURE
AER RESULT:: NO GROWTH
ANA RESULT:: NO GROWTH
MICRO NUMBER:: 10063684
MICRO NUMBER:: 10063685
SPECIMEN QUALITY:: ADEQUATE
SPECIMEN QUALITY:: ADEQUATE

## 2019-03-04 ENCOUNTER — Other Ambulatory Visit: Payer: Self-pay | Admitting: Sports Medicine

## 2019-03-04 MED FILL — TERBINAFINE HCL 250 MG TAB: 250 | 90 days supply | Qty: 90 | Fill #0

## 2019-03-06 MED FILL — ELIQUIS 5 MG TABLET: 5 | 30 days supply | Qty: 60 | Fill #1

## 2019-03-11 DIAGNOSIS — M25511 Pain in right shoulder: Secondary | ICD-10-CM | POA: Diagnosis not present

## 2019-03-11 DIAGNOSIS — M19012 Primary osteoarthritis, left shoulder: Secondary | ICD-10-CM | POA: Diagnosis not present

## 2019-03-11 DIAGNOSIS — M19011 Primary osteoarthritis, right shoulder: Secondary | ICD-10-CM | POA: Diagnosis not present

## 2019-03-12 MED FILL — METOPROLOL SUCCINATE ER 100: 100 | 90 days supply | Qty: 180 | Fill #1

## 2019-03-25 DIAGNOSIS — Z471 Aftercare following joint replacement surgery: Secondary | ICD-10-CM | POA: Diagnosis not present

## 2019-03-25 DIAGNOSIS — Z96642 Presence of left artificial hip joint: Secondary | ICD-10-CM | POA: Diagnosis not present

## 2019-03-27 ENCOUNTER — Other Ambulatory Visit: Payer: Self-pay | Admitting: Interventional Radiology

## 2019-03-27 DIAGNOSIS — Z95828 Presence of other vascular implants and grafts: Secondary | ICD-10-CM

## 2019-04-02 ENCOUNTER — Other Ambulatory Visit: Payer: Self-pay | Admitting: Sports Medicine

## 2019-04-02 ENCOUNTER — Other Ambulatory Visit: Payer: Self-pay | Admitting: Cardiology

## 2019-04-02 DIAGNOSIS — F411 Generalized anxiety disorder: Secondary | ICD-10-CM

## 2019-04-02 MED FILL — ELIQUIS 5 MG TABLET: 5 | 30 days supply | Qty: 60 | Fill #0

## 2019-04-02 MED FILL — DULoxetine HCL 60 MG CPEP: 60 | 90 days supply | Qty: 90 | Fill #0

## 2019-04-02 NOTE — Telephone Encounter (Signed)
Last OV 02/21/19 59 years old 155kg Scr 0.99 on 06/21/2018 Eliquis 5mg  BID sent to pharmacy

## 2019-04-16 ENCOUNTER — Encounter: Payer: Self-pay | Admitting: *Deleted

## 2019-04-16 ENCOUNTER — Ambulatory Visit
Admission: RE | Admit: 2019-04-16 | Discharge: 2019-04-16 | Disposition: A | Discharge status: Home / Self Care | Payer: 59 | Source: Ambulatory Visit | Attending: Interventional Radiology | Admitting: Interventional Radiology

## 2019-04-16 DIAGNOSIS — I8001 Phlebitis and thrombophlebitis of superficial vessels of right lower extremity: Secondary | ICD-10-CM | POA: Diagnosis not present

## 2019-04-16 DIAGNOSIS — Z95828 Presence of other vascular implants and grafts: Secondary | ICD-10-CM

## 2019-04-16 DIAGNOSIS — I82811 Embolism and thrombosis of superficial veins of right lower extremities: Secondary | ICD-10-CM | POA: Diagnosis not present

## 2019-04-16 DIAGNOSIS — Z7901 Long term (current) use of anticoagulants: Secondary | ICD-10-CM | POA: Diagnosis not present

## 2019-04-16 HISTORY — PX: IR RADIOLOGIST EVAL & MGMT: IMG5224

## 2019-04-16 NOTE — Consult Note (Signed)
Chief Complaint: IVC filter placement  Referring Physician(s): [place]  History of Present Illness: Travis Huff is a 59 y.o. male with complex past medical history significant for paroxysmal atrial fibrillation, pulmonary embolism, DVT, viral pericarditis and coronary artery disease who underwent placement of an IVC filter for temporary caval interruption purposes on 03/26/2018 prior to total hip replacement.  Patient is seen in consultation today following acquisition of bilateral lower extremity venous Doppler ultrasound for candidacy of IVC filter retrieval.  He is accompanied by his wife.  Patient remains compliant with his anticoagulation is currently without complaint.  Specifically, no change in his chronic lower extremity pain and edema.  No chest pain or shortness of breath.  No complications associated with his anticoagulation, specifically, no excessive bleeding or bruising.  Patient states that he has recovered from the hip replacement though is due to undergo a total shoulder replacement later this year.  Past Medical History:  Diagnosis Date   Arrhythmia    Arthritis    Asthma    as child   Avascular necrosis of bone of left hip (HCC)    Back pain    Blood clot in vein 06/2016   x 2 no bleeding disorders found   Complication of anesthesia    ketamine hallucinations   Coronary artery disease    Dislocated hip, left, initial encounter ([place]) 10/03/2018   GERD (gastroesophageal reflux disease)    Hypertension    PAF (paroxysmal atrial fibrillation) ([place])    Viral pericarditis 20 yrs ago    Past Surgical History:  Procedure Laterality Date   ABDOMINAL EXPOSURE N/A 06/23/2016   Procedure: ABDOMINAL EXPOSURE;  Surgeon: Rosetta Posner, MD;  Location: [facility];  Service: Vascular;  Laterality: N/A;   ANTERIOR LATERAL LUMBAR FUSION 4 LEVELS Right 06/23/2016   Procedure: Right  Lumbar two-three Lumbar three-four Lumbar four-five Anterolateral lumbar  interbody fusion;  Surgeon: Erline Levine, MD;  Location: [facility];  Service: Neurosurgery;  Laterality: Right;   ANTERIOR LUMBAR FUSION N/A 06/23/2016   Procedure: Lumbar five -sacral one  Anterior lumbar interbody fusion with Dr. Sherren Mocha Early for approach;  Surgeon: Erline Levine, MD;  Location: [facility];  Service: Neurosurgery;  Laterality: N/A;   APPLICATION OF INTRAOPERATIVE CT SCAN N/A 06/26/2016   Procedure: APPLICATION OF INTRAOPERATIVE CT SCAN;  Surgeon: Erline Levine, MD;  Location: [facility];  Service: Neurosurgery;  Laterality: N/A;   CARDIAC CATHETERIZATION     CARDIOVASCULAR STRESS TEST     Negative in May 2014   COLONOSCOPY     HERNIA REPAIR     umbilical   HIP CLOSED REDUCTION Left 10/03/2018   Procedure: CLOSED REDUCTION HIP;  Surgeon: Rod Can, MD;  Location: [facility];  Service: Orthopedics;  Laterality: Left;   IR IVC FILTER PLMT / S&I /IMG GUID/MOD SED  03/26/2018   JOINT REPLACEMENT     LEFT HEART CATH AND CORONARY ANGIOGRAPHY N/A 06/21/2018   Procedure: LEFT HEART CATH AND CORONARY ANGIOGRAPHY;  Surgeon: Jettie Booze, MD;  Location: [facility];  Service: Cardiovascular;  Laterality: N/A;   POSTERIOR LUMBAR FUSION 4 LEVEL N/A 06/26/2016   Procedure: Thoracic ten to Pelvis fixation with AIRO;  Surgeon: Erline Levine, MD;  Location: [facility];  Service: Neurosurgery;  Laterality: N/A;   TENDON REPAIR     right hand   TOTAL HIP ARTHROPLASTY Right 05/25/2014   Procedure: RIGHT TOTAL HIP ARTHROPLASTY ANTERIOR APPROACH;  Surgeon: Rod Can, MD;  Location: [facility];  Service: Orthopedics;  Laterality: Right;   TOTAL HIP ARTHROPLASTY Left 03/27/2018   Procedure: TOTAL HIP ARTHROPLASTY ANTERIOR APPROACH- DA complex;  Surgeon: Rod Can, MD;  Location: [facility];  Service: Orthopedics;  Laterality: Left;   TOTAL KNEE ARTHROPLASTY Bilateral     Allergies: Ketamine and Morphine and related  Medications: Prior to Admission medications   Medication Sig Start  Date End Date Taking? Authorizing Provider  acetaminophen (TYLENOL) 325 MG tablet Take 650 mg by mouth every 6 (six) hours as needed for mild pain or headache.    [provider]  atorvastatin (LIPITOR) 80 MG tablet Take 1 tablet (80 mg total) by mouth daily at 6 PM. 06/22/18   Marrianne Mood D, PA-C  cyclobenzaprine (FLEXERIL) 10 MG tablet TAKE 2 TABLETS (20 MG TOTAL) BY MOUTH 2 (TWO) TIMES DAILY. 02/10/19   Silverio Decamp, MD  docusate sodium (COLACE) 100 MG capsule Take 1 capsule (100 mg total) by mouth 2 (two) times daily. 03/28/18   Swinteck, Aaron Edelman, MD  DULoxetine (CYMBALTA) 60 MG capsule TAKE 1 CAPSULE (60 MG TOTAL) BY MOUTH AT BEDTIME. 04/02/19   Silverio Decamp, MD  ELIQUIS 5 MG TABS tablet TAKE 1 TABLET BY MOUTH TWICE DAILY 04/02/19   Camnitz, Ocie Doyne, MD  HYDROmorphone (DILAUDID) 4 MG tablet Take 1 tablet (4 mg total) by mouth every 4 (four) hours as needed for moderate pain or severe pain. 10/11/18   Silverio Decamp, MD  lisinopril-hydrochlorothiazide (ZESTORETIC) 20-25 MG tablet Take 1 tablet by mouth daily. 07/26/18   Silverio Decamp, MD  metoprolol succinate (TOPROL-XL) 100 MG 24 hr tablet Take 2 tablets (200 mg total) by mouth daily. Take 2 tablets by mouth in the morning 11/13/18   Isaiah Serge, NP  nitroGLYCERIN (NITROSTAT) 0.4 MG SL tablet Place 1 tablet (0.4 mg total) under the tongue every 5 (five) minutes x 3 doses as needed for chest pain. If taking third dose, call 911. 06/22/18   Marrianne Mood D, PA-C  polyethylene glycol (MIRALAX / GLYCOLAX) 17 g packet Take 17 g by mouth daily as needed for mild constipation.    [provider]  terbinafine (LAMISIL) 250 MG tablet TAKE 1 TABLET (250 MG TOTAL) BY MOUTH DAILY. 03/04/19   Silverio Decamp, MD  zolpidem (AMBIEN) 10 MG tablet TAKE 1 TABLET (10 MG TOTAL) BY MOUTH AT BEDTIME AS NEEDED FOR SLEEP. 12/20/18   Silverio Decamp, MD     Family History  Adopted: Yes  Problem  Relation Age of Onset   Healthy Daughter    Healthy Daughter     Social History   Socioeconomic History   Marital status: Married    Spouse name: Travis Huff   Number of children: 2   Years of education: 12   Highest education level: Not on file  Occupational History   Occupation: Public relations account executive  Tobacco Use   Smoking status: Former Smoker    Packs/day: 1.00    Years: 25.00    Pack years: 25.00    Types: Cigarettes    Quit date: 2000    Years since quitting: 21.2   Smokeless tobacco: Current User    Types: Snuff  Substance and Sexual Activity   Alcohol use: Yes    Alcohol/week: 14.0 standard drinks    Types: 14 Cans of beer per week    Comment: 2 beers QD   Drug use: No   Sexual activity: Not on file  Other Topics Concern   Not on  file  Social History Narrative   Lives with wife in a one story home.  Has 2 children.  Works as a Public relations account executive.  Education: high school.    Social Determinants of Health   Financial Resource Strain:    Difficulty of Paying Living Expenses:   Food Insecurity: No Food Insecurity   Worried About Charity fundraiser in the Last Year: Never true   Ran Out of Food in the Last Year: Never true  Transportation Needs: No Transportation Needs   Lack of Transportation (Medical): No   Lack of Transportation (Non-Medical): No  Physical Activity: Inactive   Days of Exercise per Week: 0 days   Minutes of Exercise per Session: 0 min  Stress: Stress Concern Present   Feeling of Stress : To some extent  Social Connections:    Frequency of Communication with Friends and Family:    Frequency of Social Gatherings with Friends and Family:    Attends Religious Services:    Active Member of Clubs or Organizations:    Attends Music therapist:    Marital Status:     ECOG Status: 1 - Symptomatic but completely ambulatory  Review of Systems: A 12 point ROS discussed and pertinent positives are indicated in the HPI  above.  All other systems are negative.  Review of Systems  Vital Huff: There were no vitals taken for this visit.  Physical Exam  Mallampati Score:     Imaging: US Venous Img Lower Bilateral (DVT)  Result Date: 04/16/2019 CLINICAL DATA:  History of pulmonary embolism with recurrent lower extremity DVT requiring lifelong anticoagulation who underwent placement of a IVC filter for temporary caval interruption purposes on 03/26/2018 prior to left hip surgery. Patient presents today for evaluation and management for potential IVC filter retrieval. EXAM: BILATERAL LOWER EXTREMITY VENOUS DOPPLER ULTRASOUND TECHNIQUE: Gray-scale sonography with graded compression, as well as color Doppler and duplex ultrasound were performed to evaluate the lower extremity deep venous systems from the level of the common femoral vein and including the common femoral, femoral, profunda femoral, popliteal and calf veins including the posterior tibial, peroneal and gastrocnemius veins when visible. The superficial great saphenous vein was also interrogated. Spectral Doppler was utilized to evaluate flow at rest and with distal augmentation maneuvers in the common femoral, femoral and popliteal veins. COMPARISON:  Right lower extremity venous Doppler ultrasound-11/26/2017 (DVT involving the right gastrocnemius vein as well as SVT involving the right greater saphenous vein); IVC filter placement-03/26/2018 FINDINGS: RIGHT LOWER EXTREMITY Common Femoral Vein: No evidence of thrombus. Normal compressibility, respiratory phasicity and response to augmentation. Saphenofemoral Junction: No evidence of thrombus. Normal compressibility and flow on color Doppler imaging. Profunda Femoral Vein: No evidence of thrombus. Normal compressibility and flow on color Doppler imaging. Femoral Vein: No evidence of thrombus. Normal compressibility, respiratory phasicity and response to augmentation. Popliteal Vein: No evidence of thrombus. Normal  compressibility, respiratory phasicity and response to augmentation. Calf Veins: No evidence of thrombus. Normal compressibility and flow on color Doppler imaging. Superficial Great Saphenous Vein: While the greater saphenous vein appears patent proximally, there is hypoechoic nonocclusive thrombus within the distal aspect of the greater saphenous vein at the level the right calf (image 64). Other Findings:  None. _________________________________________________________ LEFT LOWER EXTREMITY Common Femoral Vein: No evidence of thrombus. Normal compressibility, respiratory phasicity and response to augmentation. Saphenofemoral Junction: No evidence of thrombus. Normal compressibility and flow on color Doppler imaging. Profunda Femoral Vein: No evidence of thrombus. Normal compressibility and flow  on color Doppler imaging. Femoral Vein: No evidence of thrombus. Normal compressibility, respiratory phasicity and response to augmentation. Popliteal Vein: No evidence of thrombus. Normal compressibility, respiratory phasicity and response to augmentation. Calf Veins: No evidence of thrombus. Normal compressibility and flow on color Doppler imaging. Superficial Great Saphenous Vein: No evidence of thrombus. Normal compressibility. Venous Reflux:  None. Other Findings:  None. IMPRESSION: 1. No evidence of acute or chronic DVT within either lower extremity. 2. Examination is positive for occlusive superficial thrombophlebitis involving the distal aspect the greater saphenous vein at the level the right calf, improved compared to the 11/2017 examination. Electronically Signed   By: Sandi Mariscal M.D.   On: 04/16/2019 14:22    Labs:  CBC: Recent Labs    06/19/18 1109 06/20/18 2254 06/21/18 0532 06/22/18 0234  WBC 12.1* 12.5* 9.5 9.1  HGB 13.5 14.1 13.6 14.0  HCT 41.3 45.4 42.6 43.6  PLT 303 310 282 300    COAGS: Recent Labs    06/20/18 2318 06/21/18 0727  APTT 32 47*    BMP: Recent Labs     06/19/18 1109 06/20/18 2254 06/21/18 0532  NA 141 137 138  K 4.6 4.2 4.6  CL 102 100 99  CO2 28 25 28   GLUCOSE 111* 92 115*  BUN 22 15 13   CALCIUM 9.6 9.5 9.6  CREATININE 0.97 0.99 0.99  GFRNONAA 86 >60 >60  GFRAA 100 >60 >60    LIVER FUNCTION TESTS: Recent Labs    06/19/18 1109 11/20/18 1151  BILITOT 0.3 0.4  AST 32 30  ALT 32 30  PROT 6.9 6.8    TUMOR MARKERS: No results for input(s): AFPTM, CEA, CA199, CHROMGRNA in the last 8760 hours.  Assessment and Plan:  Travis Huff is a 59 y.o. male with complex past medical history significant for paroxysmal atrial fibrillation, pulmonary embolism, DVT, viral pericarditis and coronary artery disease who underwent placement of an IVC filter for temporary caval interruption purposes on 03/26/2018 prior to total hip replacement.  Patient is seen in consultation today following acquisition of bilateral lower extremity venous Doppler ultrasound for candidacy of IVC filter retrieval.  Bilateral lower extremity venous Doppler ultrasound performed today in the IR clinic demonstrates no acute or chronic DVT with minimal chronic occlusive SVT with the distal aspect of the right greater saphenous vein, improved compared to the 11/26/2017 examination.  As the patient is tolerating his anticoagulation well without evidence of acute or chronic DVT he is technically a candidate for IVC filter retrieval however he tells me that he is to undergo a total shoulder replacement later this year and as such I would keep the IVC filter in place until he has fully recovered from the total shoulder replacement.  Additionally, the patient's wife expressed great concerns regarding removal of the IVC filter as the patient has previously experienced a unprovoked lower extremity DVT while on anticoagulation.  Risks and benefits of both maintaining and removing the IVC filter were discussed in great detail with the patient.  I explained the primary downside of  maintaining the IVC filter is the risk of developing caval thrombosis, however I explained that this conceivably could occur in the setting of the IVC filter successfully trapping large mobile clot from the lower extremity.  Following this prolonged and detailed conversation, the patient and the patient's wife wish to maintain the IVC filter until he fully recovers from his total shoulder replacement and will continue to weigh the risk and benefits of future removal.  Additionally, I explained to the patient that the IVC filter utilized [facility]) is MRI compatible and he is able to undergo his scheduled shoulder MRI.[name]  The patient and the patient's wife know to call the interventional radiology clinic once he has recovered from the surgery to once again evaluate for appropriateness of IVC filter retrieval.  A copy of this report was sent to the requesting provider on this date.  Electronically Signed: Sandi Mariscal 04/16/2019, 4:07 PM   I spent a total of 25 Minutes in face to face in clinical consultation, greater than 50% of which was counseling/coordinating care for IVC filter retrieval

## 2019-04-17 MED FILL — LISINOPRIL-HYDROCHLOROTHIAZ: 20-25 | 90 days supply | Qty: 90 | Fill #0

## 2019-04-19 IMAGING — DX PORTABLE PELVIS 1-2 VIEWS
1 series · 1 of 1 positions shown · non-contrast
Comparison: Intraoperative radiograph 03/27/2018

CLINICAL DATA: Left hip replacement.

EXAM:
PORTABLE PELVIS 1-2 VIEWS

[pelvis ap]
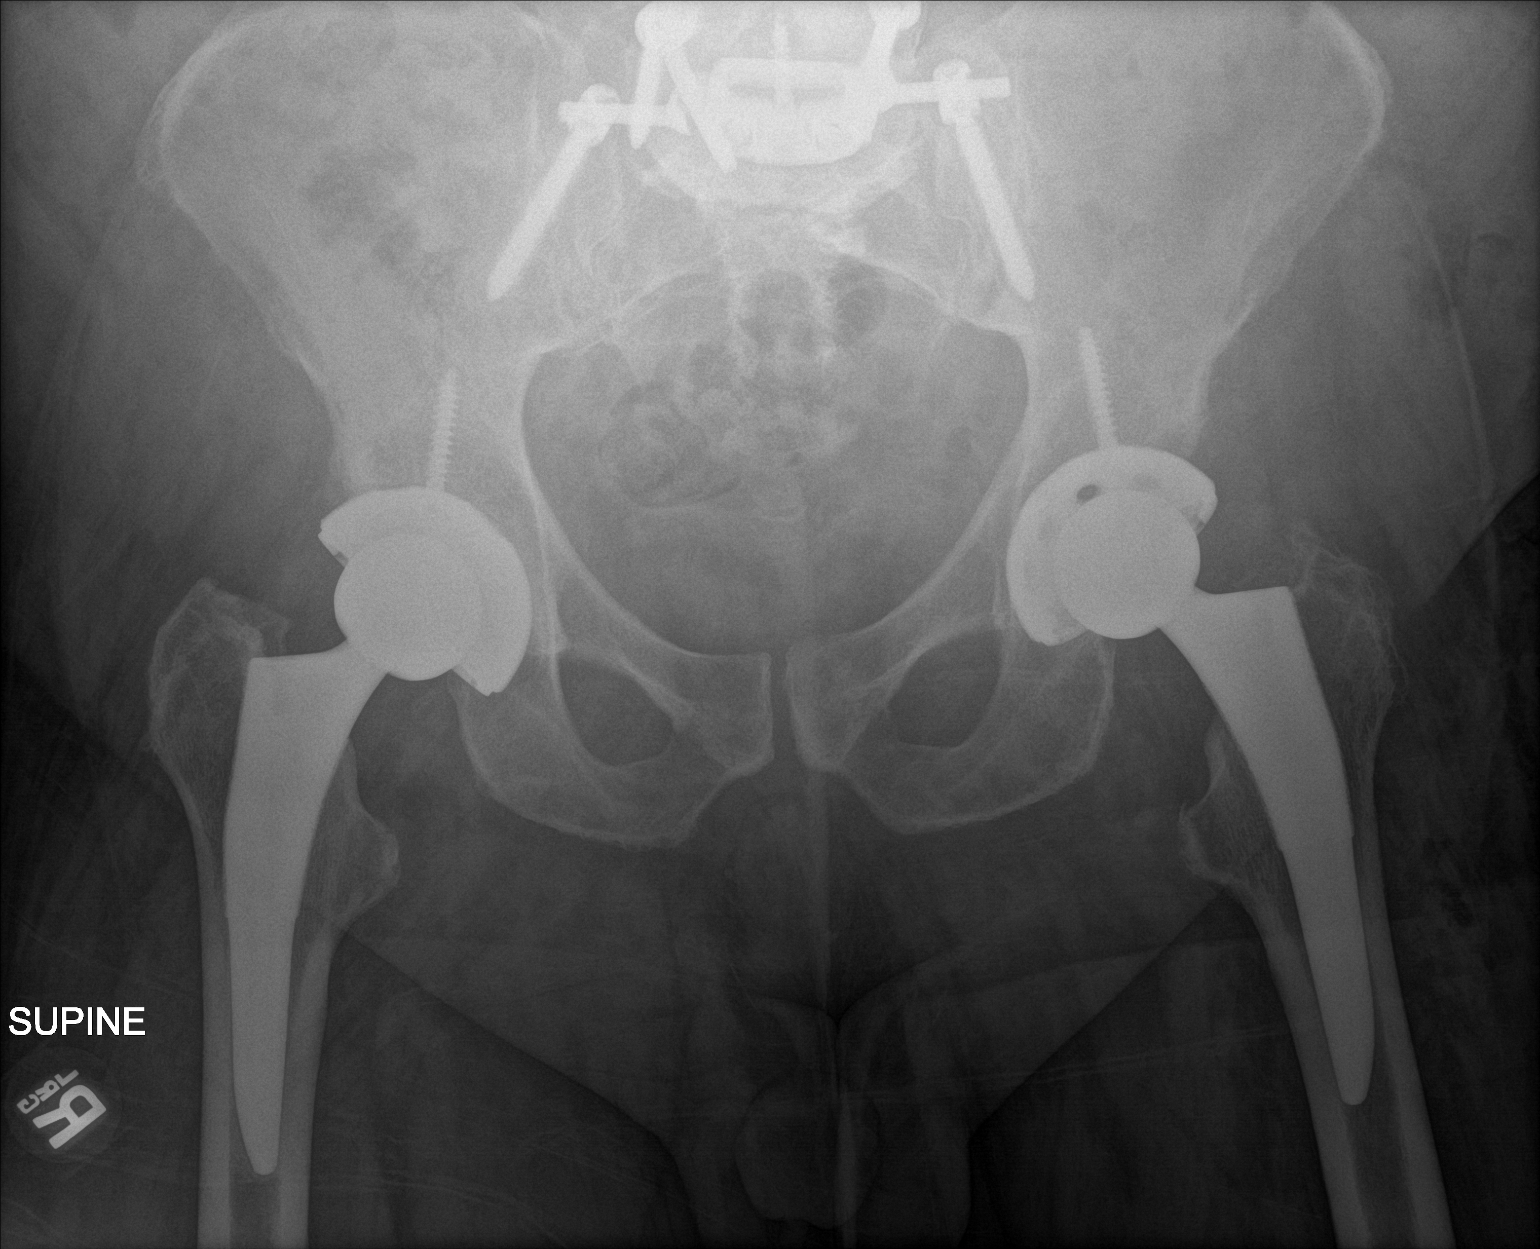

[1 of 1 positions shown; findings below may reference images not displayed]

FINDINGS: Post left hip arthroplasty with normal alignment of the orthopedic
hardware. No fracture is seen. Prior right hip arthroplasty and
partially visualized lower lumbosacral spine fusion.
IMPRESSION: Post left hip arthroplasty without evidence of immediate
complications.

## 2019-04-19 IMAGING — RF OPERATIVE LEFT HIP WITH PELVIS
1 series · 3 of 3 positions shown · non-contrast
Comparison: 01/08/2018

CLINICAL DATA: Left hip replacement

EXAM:
OPERATIVE LEFT HIP WITH PELVIS

[Series 1: unknown protocol · 0.20mm/px · 3 of 3 slices shown]
[im 1/3]
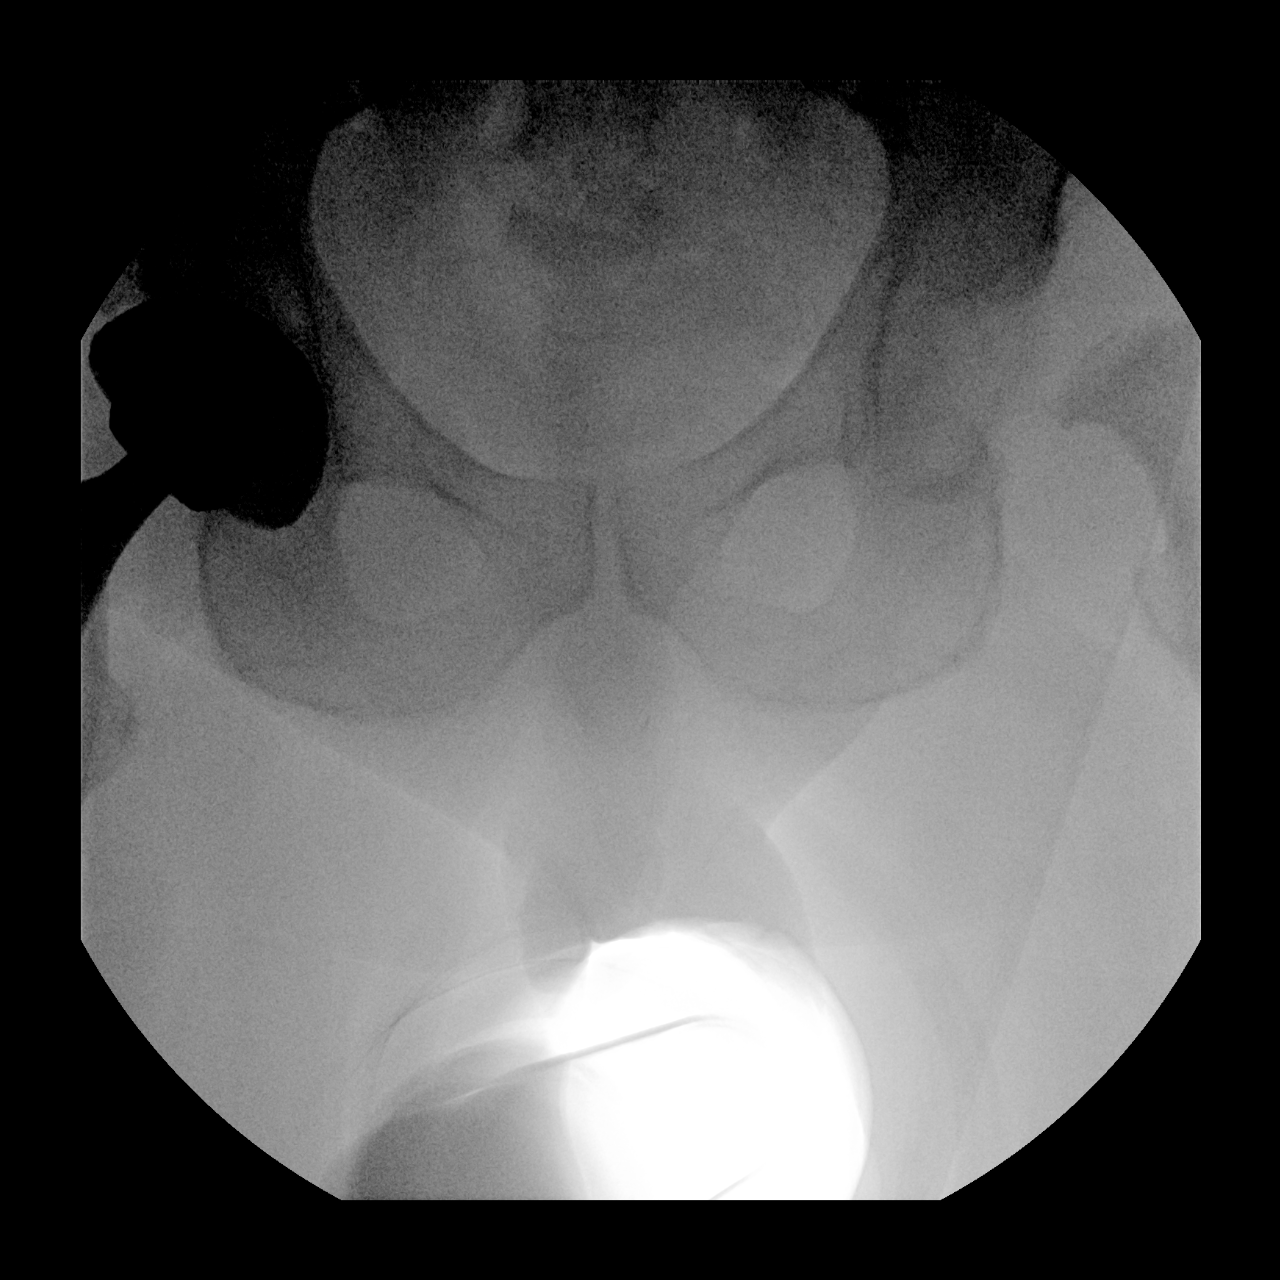
[im 2/3]
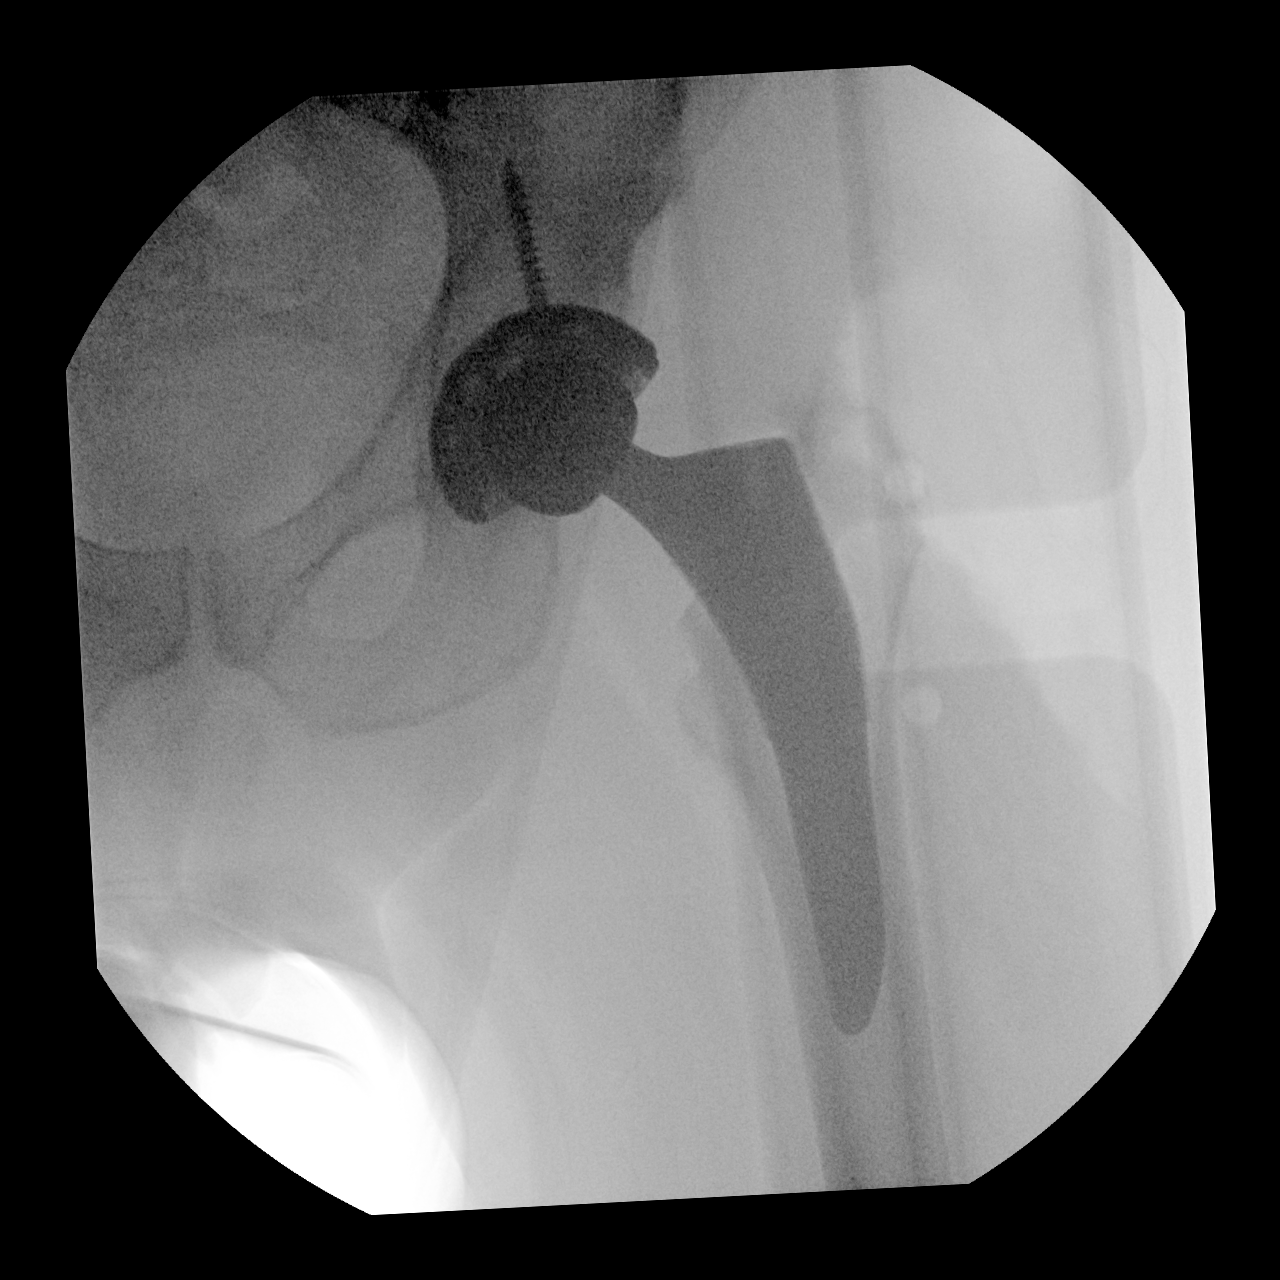
[im 3/3]
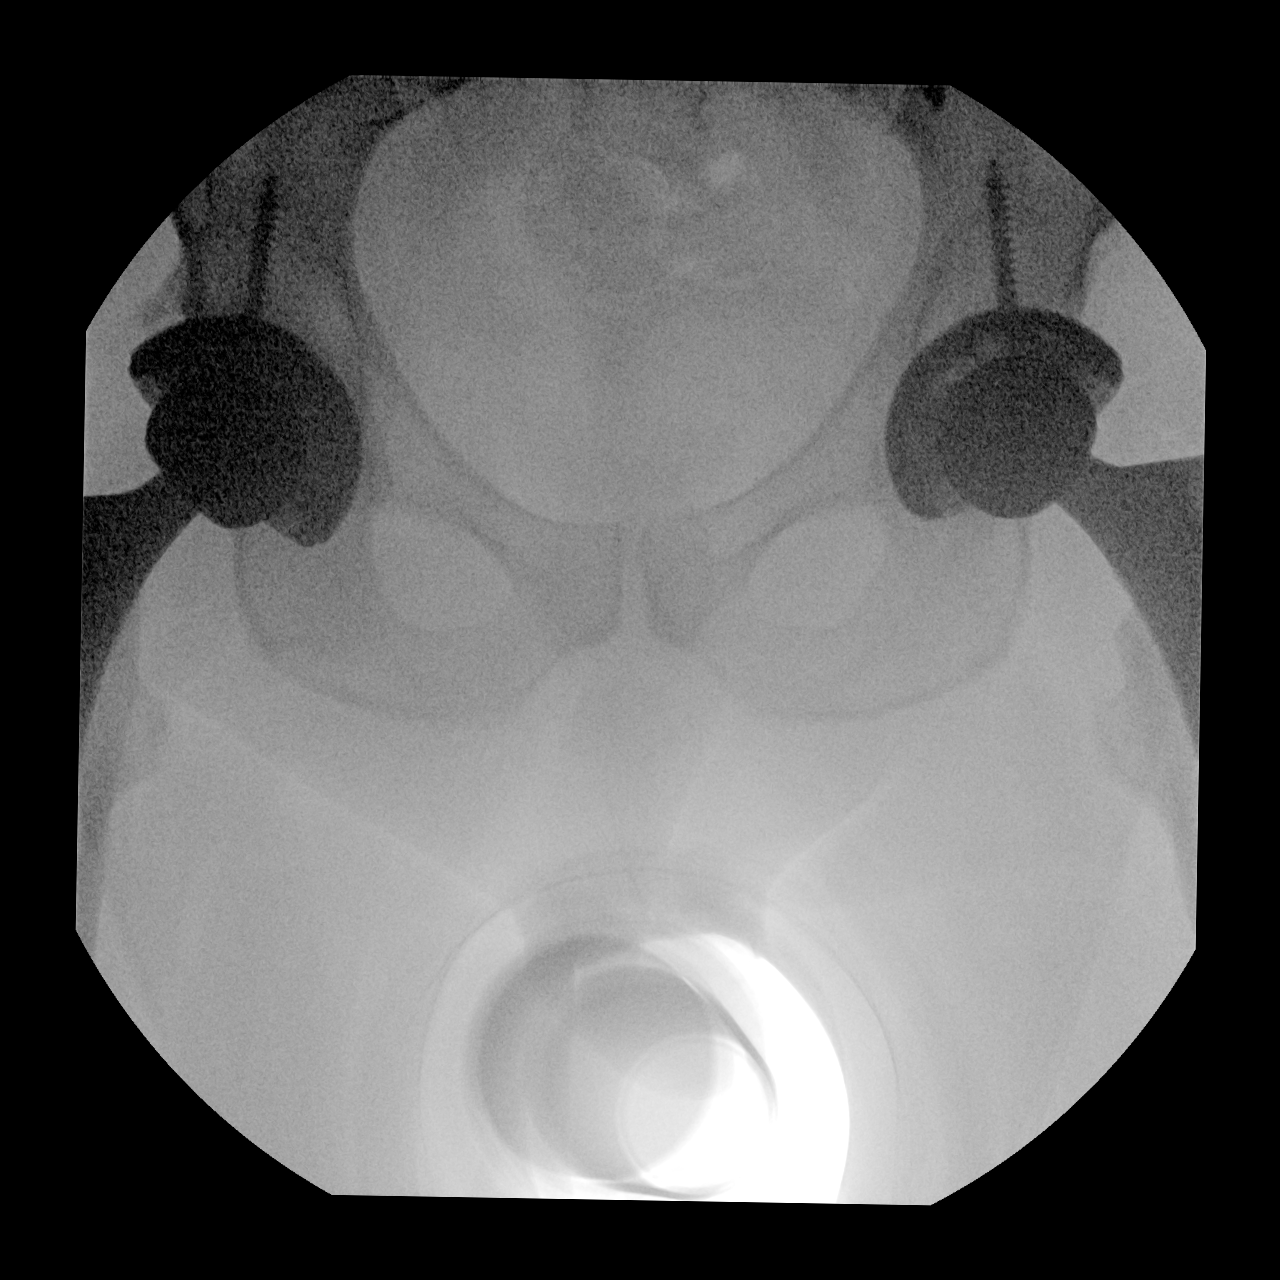

[3 of 3 positions shown; findings below may reference images not displayed]

FLUOROSCOPY TIME:  Radiation Exposure Index (as provided by the
fluoroscopic device): 3.02 mGy

If the device does not provide the exposure index:

Fluoroscopy Time:  24 seconds

Number of Acquired Images:  3
FINDINGS: Initial image again demonstrates significant remodeling of the left
femoral head and widening of the hip joint. Subsequent hip
prosthesis is noted in satisfactory position. No acute abnormality
noted.
IMPRESSION: Right hip replacement as described.

## 2019-04-23 MED FILL — ATORVASTATIN 80 MG TABLET: 80 | 60 days supply | Qty: 60 | Fill #3

## 2019-05-02 DIAGNOSIS — M19011 Primary osteoarthritis, right shoulder: Secondary | ICD-10-CM | POA: Diagnosis not present

## 2019-05-07 ENCOUNTER — Telehealth (HOSPITAL_COMMUNITY): Payer: Self-pay | Admitting: Physician Assistant

## 2019-05-07 DIAGNOSIS — M19011 Primary osteoarthritis, right shoulder: Secondary | ICD-10-CM | POA: Diagnosis not present

## 2019-05-07 NOTE — Telephone Encounter (Addendum)
Informed pt Dr. Curt Bears recommends f/u in the AFib clinic to further evaluate treatment plan and discuss medication changes. Pt agreeable and understands their office will call to arrange OV. General directions to clinic were given to pt, but pt aware AFib clinic will give more detailed instruction when they call.

## 2019-05-07 NOTE — Telephone Encounter (Signed)
Per message from [name], RN, called pt to schedule appt.  Pt had sent MyChart message he was in AF and Dr. Curt Bears requested pt be seen at AFib Clinic.  Pt was scheduled for 05/14/19 @1 :30 with Adline Peals, PA per pt request; pt declined sooner appt.

## 2019-05-08 ENCOUNTER — Telehealth: Payer: Self-pay

## 2019-05-08 NOTE — Telephone Encounter (Signed)
   [facility] Pre-operative Risk Assessment    Request for surgical clearance:  1. What type of surgery is being performed? RT REVERSE SHOULDER ARTHROPLASTY   2. When is this surgery scheduled? TBD   3. What type of clearance is required (medical clearance vs. Pharmacy clearance to hold med vs. Both)? BOTH  4. Are there any medications that need to be held prior to surgery and how long? ELIQUIS   5. Practice name and name of physician performing surgery? [facility]; DR. Lennette Bihari SUPPLE   6. What is your office phone number [phone]    7.   What is your office fax number [phone]  8.   Anesthesia type (None, local, MAC, general) ? GENERAL   Travis Huff 05/08/2019, 3:22 PM  _________________________________________________________________   (provider comments below)

## 2019-05-09 NOTE — Telephone Encounter (Signed)
Can you please provide guidance for holding AC (hx of multiple PE's, IVC filter in place)?

## 2019-05-09 NOTE — Telephone Encounter (Signed)
Patient with diagnosis of multiple PE's, afib on Eliquis for anticoagulation.    Procedure: RT REVERSE SHOULDER ARTHROPLASTY  Date of procedure: TBD  CHADS2-VASc score of  5 (CHF, HTN,  VTEx 2, CAD)  CrCl 124 ml/min  Due to patients hx of multiple PE/DVT he is high risk off anticoagulation. I will defer to Dr. Tamala Julian.

## 2019-05-12 NOTE — Telephone Encounter (Signed)
   Primary Cardiologist: Sinclair Grooms, MD  Chart reviewed as part of pre-operative protocol coverage.   Awaiting input from Dr. Tamala Julian.  It should also be noted the patient recently sent message to the atrial fib clnic indicating he was back in atrial fibrillation, and has appointment with Ricky on 05/14/19 to discuss options for treatment. This should likely be sorted out before formally clearing for surgery. Await plan from that visit.  Ricky, when you see patient, can you please provide feedback to the Pre-op box (P CV DIV PREOP) about plans and appropriateness for clearance for surgery?  Callback team, in the meantime, please let surgeon know patient is having more issues with atrial fibrillation and has appt with afib clinic to be seen on 5/12.   Charlie Pitter, PA-C 05/12/2019, 12:04 PM

## 2019-05-12 NOTE — Telephone Encounter (Signed)
Requesting office aware of patients upcoming appointment

## 2019-05-13 ENCOUNTER — Ambulatory Visit: Payer: 59 | Admitting: Sports Medicine

## 2019-05-13 ENCOUNTER — Other Ambulatory Visit: Payer: Self-pay

## 2019-05-13 VITALS — BP 125/86 | HR 73 | Ht 72.0 in | Wt 334.0 lb

## 2019-05-13 DIAGNOSIS — Z01818 Encounter for other preprocedural examination: Secondary | ICD-10-CM

## 2019-05-13 DIAGNOSIS — M19011 Primary osteoarthritis, right shoulder: Secondary | ICD-10-CM

## 2019-05-13 DIAGNOSIS — I493 Ventricular premature depolarization: Secondary | ICD-10-CM

## 2019-05-13 DIAGNOSIS — I255 Ischemic cardiomyopathy: Secondary | ICD-10-CM | POA: Diagnosis not present

## 2019-05-13 NOTE — Telephone Encounter (Signed)
No clearance 'til a fib issue controlled.

## 2019-05-13 NOTE — Assessment & Plan Note (Addendum)
Izik returns, he is a pleasant 59 year old male with end-stage right shoulder osteoarthritis with planned reverse shoulder arthroplasty by Dr. Onnie Graham. This is intermediate risk noncardiac surgery, he is here for preoperative risk stratification. He does have history of paroxysmal atrial fibrillation, he also has a history of an NSTEMI with catheterization in the summer of last year that was suspicious for spontaneous coronary artery dissection, no PCI, he has recovered, ejection fraction has recovered and was 65 to 70% on echocardiogram in September of last year. He has had no episodes of chest pain with exertion, he does have a history of atrial fibrillation, currently on Eliquis. EKG normal sinus rhythm. He is currently on high-dose metoprolol, and I think this will work well as perioperative beta-blockade as well. He will hold his Eliquis 2 days prior to the procedure, and can restart postop day 1. I am going to sign off on his clearance form but he does have an appointment coming up with the A. fib clinic, I would certainly like the blessing of the cardiologists before he schedules his shoulder arthroplasty.

## 2019-05-13 NOTE — Progress Notes (Signed)
    Procedures performed today:    Twelve-lead ECG performed today, and reviewed by me, he is in normal sinus rhythm with a rate of 71 bpm, he has a single PVC on the ECG, incomplete right bundle branch block, no ST changes, no PR changes, overall unchanged from prior ECG in November 2020 with the exception of the rare PVC.  Independent interpretation of notes and tests performed by another provider:   None.  Brief History, Exam, Impression, and Recommendations:    Osteoarthritis of right glenohumeral joint Fong returns, he is a pleasant 59 year old male with end-stage right shoulder osteoarthritis with planned reverse shoulder arthroplasty by Dr. Onnie Graham. This is intermediate risk noncardiac surgery, he is here for preoperative risk stratification. He does have history of paroxysmal atrial fibrillation, he also has a history of an NSTEMI with catheterization in the summer of last year that was suspicious for spontaneous coronary artery dissection, no PCI, he has recovered, ejection fraction has recovered and was 65 to 70% on echocardiogram in September of last year. He has had no episodes of chest pain with exertion, he does have a history of atrial fibrillation, currently on Eliquis. EKG normal sinus rhythm. He is currently on high-dose metoprolol, and I think this will work well as perioperative beta-blockade as well. He will hold his Eliquis 2 days prior to the procedure, and can restart postop day 1. I am going to sign off on his clearance form but he does have an appointment coming up with the A. fib clinic, I would certainly like the blessing of the cardiologists before he schedules his shoulder arthroplasty.    ___________________________________________ Gwen Her. Dianah Field, M.D., ABFM., CAQSM. Primary Care and [place] Instructor of [place] [employer]

## 2019-05-14 ENCOUNTER — Ambulatory Visit (HOSPITAL_COMMUNITY): Payer: 59 | Admitting: Physician Assistant

## 2019-05-15 NOTE — Telephone Encounter (Signed)
Will route back to pre-op team for further management - likely need to notify/patient surgeon and make sure Afib clinic knows appointment will also be for pre-operative evaluation.

## 2019-05-20 ENCOUNTER — Ambulatory Visit (HOSPITAL_COMMUNITY)
Admission: RE | Admit: 2019-05-20 | Discharge: 2019-05-20 | Disposition: A | Discharge status: Home / Self Care | Payer: 59 | Source: Ambulatory Visit | Attending: Physician Assistant | Admitting: Physician Assistant

## 2019-05-20 ENCOUNTER — Other Ambulatory Visit: Payer: Self-pay

## 2019-05-20 VITALS — BP 120/84 | HR 82 | Ht 72.0 in | Wt 327.2 lb

## 2019-05-20 DIAGNOSIS — I10 Essential (primary) hypertension: Secondary | ICD-10-CM | POA: Diagnosis not present

## 2019-05-20 DIAGNOSIS — I255 Ischemic cardiomyopathy: Secondary | ICD-10-CM | POA: Insufficient documentation

## 2019-05-20 DIAGNOSIS — Z6841 Body Mass Index (BMI) 40.0 and over, adult: Secondary | ICD-10-CM | POA: Diagnosis not present

## 2019-05-20 DIAGNOSIS — D6869 Other thrombophilia: Secondary | ICD-10-CM | POA: Insufficient documentation

## 2019-05-20 DIAGNOSIS — Z87891 Personal history of nicotine dependence: Secondary | ICD-10-CM | POA: Insufficient documentation

## 2019-05-20 DIAGNOSIS — I2510 Atherosclerotic heart disease of native coronary artery without angina pectoris: Secondary | ICD-10-CM | POA: Insufficient documentation

## 2019-05-20 DIAGNOSIS — Z7901 Long term (current) use of anticoagulants: Secondary | ICD-10-CM | POA: Diagnosis not present

## 2019-05-20 DIAGNOSIS — Z79899 Other long term (current) drug therapy: Secondary | ICD-10-CM | POA: Diagnosis not present

## 2019-05-20 DIAGNOSIS — I480 Paroxysmal atrial fibrillation: Secondary | ICD-10-CM | POA: Insufficient documentation

## 2019-05-20 DIAGNOSIS — E669 Obesity, unspecified: Secondary | ICD-10-CM | POA: Diagnosis not present

## 2019-05-20 MED ORDER — MULTAQ 400 MG PO TABS: 400.0000 mg | ORAL_TABLET | Freq: Two times a day (BID) | ORAL | 3 refills | Status: DC

## 2019-05-20 MED FILL — MULTAQ 400 MG TABLET: 400 | 30 days supply | Qty: 60 | Fill #0

## 2019-05-20 NOTE — Progress Notes (Signed)
Primary Care Physician: Silverio Decamp, MD Primary Cardiologist: Dr Tamala Julian Primary Electrophysiologist: Dr Curt Bears Referring Physician: Dr Charm Barges is a 59 y.o. male with a history of HTN, h/o multiple Pes, NSTEMI, probable CA dissection, ischemic CM, paroxysmal atrial fibrillation who presents for follow up in the [facility]. Patient is on Eliquis for a CHADS2VASC score of 3. He was seen by Dr Curt Bears on 12/2018 and Dr Tamala Julian on 02/2019 and at that point he was having minimal afib symptoms. He has been maintained on metoprolol. Patient reports that for the last two months he has been having almost daily symptoms of heart racing, weakness, and nausea which are consistent with his afib episodes in the past. The episodes only last 10-15 minutes but he is very symptomatic. There are no triggers that the patient could identify. He denies significant snoring. He does admit to ~3 beers daily.   Today, he denies symptoms of chest pain, shortness of breath, orthopnea, PND, lower extremity edema, dizziness, presyncope, syncope, snoring, daytime somnolence, bleeding, or neurologic sequela. The patient is tolerating medications without difficulties and is otherwise without complaint today.    Atrial Fibrillation Risk Factors:  he does not have symptoms or diagnosis of sleep apnea. he does not have a history of rheumatic fever. he does have a history of alcohol use.  he has a BMI of Body mass index is 44.38 kg/m.[name] Filed Weights   05/20/19 1358  Weight: (!) 148.4 kg    Family History  Adopted: Yes  Problem Relation Age of Onset  . Healthy Daughter   . Healthy Daughter      Atrial Fibrillation Management history:  Previous antiarrhythmic drugs: none Previous cardioversions: none Previous ablations: none CHADS2VASC score: 3 Anticoagulation history: Eliquis   Past Medical History:  Diagnosis Date  . Arrhythmia   . Arthritis   .  Asthma    as child  . Avascular necrosis of bone of left hip ([place])   . Back pain   . Blood clot in vein 06/2016   x 2 no bleeding disorders found  . Complication of anesthesia    ketamine hallucinations  . Coronary artery disease   . Dislocated hip, left, initial encounter ([place]) 10/03/2018  . GERD (gastroesophageal reflux disease)   . Hypertension   . PAF (paroxysmal atrial fibrillation) ([place])   . Viral pericarditis 20 yrs ago   Past Surgical History:  Procedure Laterality Date  . ABDOMINAL EXPOSURE N/A 06/23/2016   Procedure: ABDOMINAL EXPOSURE;  Surgeon: Rosetta Posner, MD;  Location: [facility];  Service: Vascular;  Laterality: N/A;  . ANTERIOR LATERAL LUMBAR FUSION 4 LEVELS Right 06/23/2016   Procedure: Right  Lumbar two-three Lumbar three-four Lumbar four-five Anterolateral lumbar interbody fusion;  Surgeon: Erline Levine, MD;  Location: [facility];  Service: Neurosurgery;  Laterality: Right;  . ANTERIOR LUMBAR FUSION N/A 06/23/2016   Procedure: Lumbar five -sacral one  Anterior lumbar interbody fusion with Dr. Sherren Mocha Early for approach;  Surgeon: Erline Levine, MD;  Location: [facility];  Service: Neurosurgery;  Laterality: N/A;  . APPLICATION OF INTRAOPERATIVE CT SCAN N/A 06/26/2016   Procedure: APPLICATION OF INTRAOPERATIVE CT SCAN;  Surgeon: Erline Levine, MD;  Location: [facility];  Service: Neurosurgery;  Laterality: N/A;  . CARDIAC CATHETERIZATION    . CARDIOVASCULAR STRESS TEST     Negative in May 2014  . COLONOSCOPY    . HERNIA REPAIR     umbilical  . HIP CLOSED  REDUCTION Left 10/03/2018   Procedure: CLOSED REDUCTION HIP;  Surgeon: Rod Can, MD;  [facility];  Service: Orthopedics;  Laterality: Left;  . IR IVC FILTER PLMT / S&I Burke Keels GUID/MOD SED  03/26/2018  . IR RADIOLOGIST EVAL & MGMT  04/16/2019  . JOINT REPLACEMENT    . LEFT HEART CATH AND CORONARY ANGIOGRAPHY N/A 06/21/2018   Procedure: LEFT HEART CATH AND CORONARY ANGIOGRAPHY;  Surgeon: Jettie Booze, MD;  Location: [place] [facility];  Service: Cardiovascular;  Laterality: N/A;  . POSTERIOR LUMBAR FUSION 4 LEVEL N/A 06/26/2016   Procedure: Thoracic ten to Pelvis fixation with AIRO;  Surgeon: Erline Levine, MD;  Location: [facility];  Service: Neurosurgery;  Laterality: N/A;  . TENDON REPAIR     right hand  . TOTAL HIP ARTHROPLASTY Right 05/25/2014   Procedure: RIGHT TOTAL HIP ARTHROPLASTY ANTERIOR APPROACH;  Surgeon: Rod Can, MD;  Location: [facility];  Service: Orthopedics;  Laterality: Right;  . TOTAL HIP ARTHROPLASTY Left 03/27/2018   Procedure: TOTAL HIP ARTHROPLASTY ANTERIOR APPROACH- DA complex;  Surgeon: Rod Can, MD;  [facility];  Service: Orthopedics;  Laterality: Left;  . TOTAL KNEE ARTHROPLASTY Bilateral     Current Outpatient Medications  Medication Sig Dispense Refill  . acetaminophen (TYLENOL) 325 MG tablet Take 650 mg by mouth every 6 (six) hours as needed for mild pain or headache.    . Ascorbic Acid (VITAMIN C) 500 MG CAPS Take 1 capsule by mouth daily.    [name] atorvastatin (LIPITOR) 80 MG tablet Take 1 tablet (80 mg total) by mouth daily at 6 PM. 90 tablet 3  . cyclobenzaprine (FLEXERIL) 10 MG tablet TAKE 2 TABLETS (20 MG TOTAL) BY MOUTH 2 (TWO) TIMES DAILY. (Patient taking differently: Taking 4 tablets by mouth at bedtime) 360 tablet 3  . docusate sodium (COLACE) 100 MG capsule Take 1 capsule (100 mg total) by mouth 2 (two) times daily. 60 capsule 1  . DULoxetine (CYMBALTA) 60 MG capsule TAKE 1 CAPSULE (60 MG TOTAL) BY MOUTH AT BEDTIME. 90 capsule 3  . ELIQUIS 5 MG TABS tablet TAKE 1 TABLET BY MOUTH TWICE DAILY 60 tablet 5  . lisinopril-hydrochlorothiazide (ZESTORETIC) 20-25 MG tablet Take 1 tablet by mouth daily. 90 tablet 3  . metoprolol succinate (TOPROL-XL) 100 MG 24 hr tablet Take 2 tablets (200 mg total) by mouth daily. Take 2 tablets by mouth in the morning 180 tablet 3  . Multiple Vitamin (DAILY VITAMIN PO) Taking one tablet by mouth daily- Calcium, magnesium and zince tablet     . nitroGLYCERIN (NITROSTAT) 0.4 MG SL tablet Place 1 tablet (0.4 mg total) under the tongue every 5 (five) minutes x 3 doses as needed for chest pain. If taking third dose, call 911. 25 tablet 3  . polyethylene glycol (MIRALAX / GLYCOLAX) 17 g packet Take 17 g by mouth every other day.     . terbinafine (LAMISIL) 250 MG tablet TAKE 1 TABLET (250 MG TOTAL) BY MOUTH DAILY. 90 tablet 1  . zolpidem (AMBIEN) 10 MG tablet TAKE 1 TABLET (10 MG TOTAL) BY MOUTH AT BEDTIME AS NEEDED FOR SLEEP. 30 tablet 1  . dronedarone (MULTAQ) 400 MG tablet Take 1 tablet (400 mg total) by mouth 2 (two) times daily with a meal. 60 tablet 3   No current facility-administered medications for this encounter.    Allergies  Allergen Reactions  . Ketamine     hallucinations  . Morphine And Related Itching    Social History  Socioeconomic History  . Marital status: Married    Spouse name: [name]  . Number of children: 2  . Years of education: 93  . Highest education level: Not on file  Occupational History  . Occupation: Public relations account executive  Tobacco Use  . Smoking status: Former Smoker    Packs/day: 1.00    Years: 25.00    Pack years: 25.00    Types: Cigarettes    Quit date: 2000    Years since quitting: 21.3  . Smokeless tobacco: Current User    Types: Snuff  Substance and Sexual Activity  . Alcohol use: Yes    Alcohol/week: 14.0 standard drinks    Types: 14 Cans of beer per week    Comment: 2 beers QD  . Drug use: No  . Sexual activity: Not on file  Other Topics Concern  . Not on file  Social History Narrative   Lives with wife in a one story home.  Has 2 children.  Works as a Public relations account executive.  Education: high school.    Social Determinants of Health   Financial Resource Strain:   . Difficulty of Paying Living Expenses:   Food Insecurity: No Food Insecurity  . Worried About Charity fundraiser in the Last Year: Never true  . Ran Out of Food in the Last Year: Never true  Transportation Needs: No  Transportation Needs  . Lack of Transportation (Medical): No  . Lack of Transportation (Non-Medical): No  Physical Activity: Inactive  . Days of Exercise per Week: 0 days  . Minutes of Exercise per Session: 0 min  Stress: Stress Concern Present  . Feeling of Stress : To some extent  Social Connections:   . Frequency of Communication with Friends and Family:   . Frequency of Social Gatherings with Friends and Family:   . Attends Religious Services:   . Active Member of Clubs or Organizations:   . Attends Archivist Meetings:   [name] Marital Status:   Intimate Partner Violence:   . Fear of Current or Ex-Partner:   . Emotionally Abused:   [name] Physically Abused:   . Sexually Abused:      ROS- All systems are reviewed and negative except as per the HPI above.  Physical Exam: Vitals:   05/20/19 1358  BP: 120/84  Pulse: 82  Weight: (!) 148.4 kg  Height: 6' (1.829 m)    GEN- The patient is well appearing obese male, alert and oriented x 3 today.   Head- normocephalic, atraumatic Eyes-  Sclera clear, conjunctiva pink Ears- hearing intact Oropharynx- clear Neck- supple  Lungs- Clear to ausculation bilaterally, normal work of breathing Heart- Regular rate and rhythm, no murmurs, rubs or gallops  GI- soft, NT, ND, + BS Extremities- no clubbing, cyanosis, or edema MS- no significant deformity or atrophy Skin- no rash or lesion Psych- euthymic mood, full affect Neuro- strength and sensation are intact  Wt Readings from Last 3 Encounters:  05/20/19 (!) 148.4 kg  05/13/19 (!) 151.5 kg  02/21/19 (!) 155.4 kg    EKG today demonstrates SR HR 82, PR 170, QRS 90, QTc 418  Echo 09/30/18 demonstrated  1. Left ventricular ejection fraction, by visual estimation, is 65 to  70%. The left ventricle has normal function. Normal left ventricular size.  There is mildly increased left ventricular hypertrophy.  2. Definity contrast agent was given IV to delineate the left ventricular    endocardial borders.  3. Left ventricular diastolic Doppler parameters are indeterminate  pattern of LV diastolic filling.  4. Global right ventricle has normal systolic function.The right  ventricular size is normal. No increase in right ventricular wall  thickness.  5. The left ventricular function has improved from previous echo   Epic records are reviewed at length today  CHA2DS2-VASc Score = 3  The patient's score is based upon: CHF History: 1 HTN History: 1 Age : 0 Diabetes History: 0 Stroke History: 0 Vascular Disease History: 1 Gender: 0      ASSESSMENT AND PLAN: 1. Paroxysmal Atrial Fibrillation (ICD10:  I48.0) The patient's CHA2DS2-VASc score is 3, indicating a 3.2% annual risk of stroke.  Patient having brief but frequent and symptomatic episodes of afib.  We discussed therapeutic options today including AAD therapy.  Will plan to start Multaq 400 mg BID. Continue Toprol 200 mg daily Continue Eliquis 5 mg BID Lifestyle modification was discussed and encouraged including alcohol reduction.   2. Secondary Hypercoagulable State (ICD10:  D68.69) The patient is at significant risk for stroke/thromboembolism based upon his CHA2DS2-VASc Score of 3.  Continue Apixaban (Eliquis).   3. Obesity Body mass index is 44.38 kg/m. Lifestyle modification was discussed at length including regular exercise and weight reduction.  4. CAD/Ischemic CM EF normalized on recent echo. No anginal symptoms.  5. Preoperative assessment  Patient is in SR, no DCCV or ablation planned at this point. Would be OK to hold anticoagulation from an afib standpoint.  Would like to see how he responds to Multaq as he continues to have frequent paroxysms of afib.   Follow up early next week for ECG.   [facility] [address] [place], [place] [address] [phone] 05/20/2019 4:19 PM

## 2019-05-20 NOTE — Patient Instructions (Signed)
Start Multaq 400mg twice a day WITH FOOD 

## 2019-05-22 NOTE — Telephone Encounter (Signed)
    Patient has atrial fibrillation clinic follow-up on 05/27/2019 to assess response to Multaq. Was previously cleared to hold anticoagulation per chart review.  Will follow from a far.  Once seen on 5/25 and if stable, will forward information to requesting team.   Kathyrn Drown NP-C [facility]

## 2019-05-27 ENCOUNTER — Ambulatory Visit (HOSPITAL_COMMUNITY)
Admission: RE | Admit: 2019-05-27 | Discharge: 2019-05-27 | Disposition: A | Discharge status: Home / Self Care | Payer: 59 | Source: Ambulatory Visit | Attending: Physician Assistant | Admitting: Physician Assistant

## 2019-05-27 ENCOUNTER — Encounter (HOSPITAL_COMMUNITY): Payer: Self-pay | Admitting: Physician Assistant

## 2019-05-27 ENCOUNTER — Other Ambulatory Visit: Payer: Self-pay

## 2019-05-27 VITALS — BP 118/80 | HR 71 | Ht 72.0 in | Wt 330.8 lb

## 2019-05-27 DIAGNOSIS — Z981 Arthrodesis status: Secondary | ICD-10-CM | POA: Diagnosis not present

## 2019-05-27 DIAGNOSIS — Z79899 Other long term (current) drug therapy: Secondary | ICD-10-CM | POA: Diagnosis not present

## 2019-05-27 DIAGNOSIS — Z885 Allergy status to narcotic agent status: Secondary | ICD-10-CM | POA: Diagnosis not present

## 2019-05-27 DIAGNOSIS — I10 Essential (primary) hypertension: Secondary | ICD-10-CM | POA: Diagnosis not present

## 2019-05-27 DIAGNOSIS — M1990 Unspecified osteoarthritis, unspecified site: Secondary | ICD-10-CM | POA: Insufficient documentation

## 2019-05-27 DIAGNOSIS — Z6841 Body Mass Index (BMI) 40.0 and over, adult: Secondary | ICD-10-CM | POA: Insufficient documentation

## 2019-05-27 DIAGNOSIS — Z96643 Presence of artificial hip joint, bilateral: Secondary | ICD-10-CM | POA: Insufficient documentation

## 2019-05-27 DIAGNOSIS — D6869 Other thrombophilia: Secondary | ICD-10-CM

## 2019-05-27 DIAGNOSIS — I2510 Atherosclerotic heart disease of native coronary artery without angina pectoris: Secondary | ICD-10-CM | POA: Insufficient documentation

## 2019-05-27 DIAGNOSIS — I255 Ischemic cardiomyopathy: Secondary | ICD-10-CM | POA: Insufficient documentation

## 2019-05-27 DIAGNOSIS — I252 Old myocardial infarction: Secondary | ICD-10-CM | POA: Diagnosis not present

## 2019-05-27 DIAGNOSIS — M879 Osteonecrosis, unspecified: Secondary | ICD-10-CM | POA: Diagnosis not present

## 2019-05-27 DIAGNOSIS — Z86718 Personal history of other venous thrombosis and embolism: Secondary | ICD-10-CM | POA: Diagnosis not present

## 2019-05-27 DIAGNOSIS — Z888 Allergy status to other drugs, medicaments and biological substances status: Secondary | ICD-10-CM | POA: Insufficient documentation

## 2019-05-27 DIAGNOSIS — Z87891 Personal history of nicotine dependence: Secondary | ICD-10-CM | POA: Insufficient documentation

## 2019-05-27 DIAGNOSIS — K219 Gastro-esophageal reflux disease without esophagitis: Secondary | ICD-10-CM | POA: Insufficient documentation

## 2019-05-27 DIAGNOSIS — Z96653 Presence of artificial knee joint, bilateral: Secondary | ICD-10-CM | POA: Insufficient documentation

## 2019-05-27 DIAGNOSIS — I480 Paroxysmal atrial fibrillation: Secondary | ICD-10-CM | POA: Diagnosis present

## 2019-05-27 DIAGNOSIS — Z7901 Long term (current) use of anticoagulants: Secondary | ICD-10-CM | POA: Insufficient documentation

## 2019-05-27 DIAGNOSIS — E669 Obesity, unspecified: Secondary | ICD-10-CM | POA: Diagnosis not present

## 2019-05-27 NOTE — Progress Notes (Signed)
Primary Care Physician: Silverio Decamp, MD Primary Cardiologist: Dr Tamala Julian Primary Electrophysiologist: Dr Curt Bears Referring Physician: Dr Charm Barges is a 59 y.o. male with a history of HTN, h/o multiple Pes, NSTEMI, probable CA dissection, ischemic CM, paroxysmal atrial fibrillation who presents for follow up in the [facility]. Patient is on Eliquis for a CHADS2VASC score of 3. He was seen by Dr Curt Bears on 12/2018 and Dr Tamala Julian on 02/2019 and at that point he was having minimal afib symptoms. He has been maintained on metoprolol. Patient reports that for the last two months he has been having almost daily symptoms of heart racing, weakness, and nausea which are consistent with his afib episodes in the past. The episodes only last 10-15 minutes but he is very symptomatic. There are no triggers that the patient could identify. He denies significant snoring. He does admit to ~3 beers daily.   On follow up today, patient reports that he has done very well since starting Multaq. He reports that he only had one very brief episode ( <5 min) and he was not very symptomatic during the episode. He is tolerating the medication without difficulty.   Today, he denies symptoms of palpitations, chest pain, shortness of breath, orthopnea, PND, lower extremity edema, dizziness, presyncope, syncope, snoring, daytime somnolence, bleeding, or neurologic sequela. The patient is tolerating medications without difficulties and is otherwise without complaint today.    Atrial Fibrillation Risk Factors:  he does not have symptoms or diagnosis of sleep apnea. he does not have a history of rheumatic fever. he does have a history of alcohol use.  he has a BMI of Body mass index is 44.86 kg/m.[name] Filed Weights   05/27/19 1433  Weight: (!) 150 kg    Family History  Adopted: Yes  Problem Relation Age of Onset  . Healthy Daughter   . Healthy Daughter      Atrial  Fibrillation Management history:  Previous antiarrhythmic drugs: Multaq Previous cardioversions: none Previous ablations: none CHADS2VASC score: 3 Anticoagulation history: Eliquis   Past Medical History:  Diagnosis Date  . Arrhythmia   . Arthritis   . Asthma    as child  . Avascular necrosis of bone of left hip ([place])   . Back pain   . Blood clot in vein 06/2016   x 2 no bleeding disorders found  . Complication of anesthesia    ketamine hallucinations  . Coronary artery disease   . Dislocated hip, left, initial encounter ([place]) 10/03/2018  . GERD (gastroesophageal reflux disease)   . Hypertension   . PAF (paroxysmal atrial fibrillation) ([place])   . Viral pericarditis 20 yrs ago   Past Surgical History:  Procedure Laterality Date  . ABDOMINAL EXPOSURE N/A 06/23/2016   Procedure: ABDOMINAL EXPOSURE;  Surgeon: Rosetta Posner, MD;  Location: [facility];  Service: Vascular;  Laterality: N/A;  . ANTERIOR LATERAL LUMBAR FUSION 4 LEVELS Right 06/23/2016   Procedure: Right  Lumbar two-three Lumbar three-four Lumbar four-five Anterolateral lumbar interbody fusion;  Surgeon: Erline Levine, MD;  Location: [facility];  Service: Neurosurgery;  Laterality: Right;  . ANTERIOR LUMBAR FUSION N/A 06/23/2016   Procedure: Lumbar five -sacral one  Anterior lumbar interbody fusion with Dr. Sherren Mocha Early for approach;  Surgeon: Erline Levine, MD;  Location: [facility];  Service: Neurosurgery;  Laterality: N/A;  . APPLICATION OF INTRAOPERATIVE CT SCAN N/A 06/26/2016   Procedure: APPLICATION OF INTRAOPERATIVE CT SCAN;  Surgeon: Erline Levine, MD;  Location: [facility];  Service: Neurosurgery;  Laterality: N/A;  . CARDIAC CATHETERIZATION    . CARDIOVASCULAR STRESS TEST     Negative in May 2014  . COLONOSCOPY    . HERNIA REPAIR     umbilical  . HIP CLOSED REDUCTION Left 10/03/2018   Procedure: CLOSED REDUCTION HIP;  Surgeon: Rod Can, MD;  [facility];  Service: Orthopedics;  Laterality: Left;  . IR IVC FILTER PLMT /  S&I Burke Keels GUID/MOD SED  03/26/2018  . IR RADIOLOGIST EVAL & MGMT  04/16/2019  . JOINT REPLACEMENT    . LEFT HEART CATH AND CORONARY ANGIOGRAPHY N/A 06/21/2018   Procedure: LEFT HEART CATH AND CORONARY ANGIOGRAPHY;  Surgeon: Jettie Booze, MD;  Location: [facility];  Service: Cardiovascular;  Laterality: N/A;  . POSTERIOR LUMBAR FUSION 4 LEVEL N/A 06/26/2016   Procedure: Thoracic ten to Pelvis fixation with AIRO;  Surgeon: Erline Levine, MD;  Location: [facility] [place];  Service: Neurosurgery;  Laterality: N/A;  . TENDON REPAIR     right hand  . TOTAL HIP ARTHROPLASTY Right 05/25/2014   Procedure: RIGHT TOTAL HIP ARTHROPLASTY ANTERIOR APPROACH;  Surgeon: Rod Can, MD;  Location: [facility];  Service: Orthopedics;  Laterality: Right;  . TOTAL HIP ARTHROPLASTY Left 03/27/2018   Procedure: TOTAL HIP ARTHROPLASTY ANTERIOR APPROACH- DA complex;  Surgeon: Rod Can, MD;  [facility];  Service: Orthopedics;  Laterality: Left;  . TOTAL KNEE ARTHROPLASTY Bilateral     Current Outpatient Medications  Medication Sig Dispense Refill  . acetaminophen (TYLENOL) 325 MG tablet Take 650 mg by mouth every 6 (six) hours as needed for mild pain or headache.    . Ascorbic Acid (VITAMIN C) 500 MG CAPS Take 1 capsule by mouth daily.    [name] atorvastatin (LIPITOR) 80 MG tablet Take 1 tablet (80 mg total) by mouth daily at 6 PM. 90 tablet 3  . cyclobenzaprine (FLEXERIL) 10 MG tablet TAKE 2 TABLETS (20 MG TOTAL) BY MOUTH 2 (TWO) TIMES DAILY. (Patient taking differently: Taking 4 tablets by mouth at bedtime) 360 tablet 3  . docusate sodium (COLACE) 100 MG capsule Take 1 capsule (100 mg total) by mouth 2 (two) times daily. 60 capsule 1  . dronedarone (MULTAQ) 400 MG tablet Take 1 tablet (400 mg total) by mouth 2 (two) times daily with a meal. 60 tablet 3  . DULoxetine (CYMBALTA) 60 MG capsule TAKE 1 CAPSULE (60 MG TOTAL) BY MOUTH AT BEDTIME. 90 capsule 3  . ELIQUIS 5 MG TABS tablet TAKE 1 TABLET BY MOUTH TWICE  DAILY 60 tablet 5  . lisinopril-hydrochlorothiazide (ZESTORETIC) 20-25 MG tablet Take 1 tablet by mouth daily. 90 tablet 3  . metoprolol succinate (TOPROL-XL) 100 MG 24 hr tablet Take 2 tablets (200 mg total) by mouth daily. Take 2 tablets by mouth in the morning 180 tablet 3  . Multiple Vitamin (DAILY VITAMIN PO) Taking one tablet by mouth daily- Calcium, magnesium and zince tablet    . nitroGLYCERIN (NITROSTAT) 0.4 MG SL tablet Place 1 tablet (0.4 mg total) under the tongue every 5 (five) minutes x 3 doses as needed for chest pain. If taking third dose, call 911. 25 tablet 3  . polyethylene glycol (MIRALAX / GLYCOLAX) 17 g packet Take 17 g by mouth every other day.     . terbinafine (LAMISIL) 250 MG tablet TAKE 1 TABLET (250 MG TOTAL) BY MOUTH DAILY. 90 tablet 1  . zolpidem (AMBIEN) 10 MG tablet TAKE 1 TABLET (10 MG TOTAL)  BY MOUTH AT BEDTIME AS NEEDED FOR SLEEP. 30 tablet 1   No current facility-administered medications for this encounter.    Allergies  Allergen Reactions  . Ketamine     hallucinations  . Morphine And Related Itching    Social History   Socioeconomic History  . Marital status: Married    Spouse name: [name]  . Number of children: 2  . Years of education: 45  . Highest education level: Not on file  Occupational History  . Occupation: Public relations account executive  Tobacco Use  . Smoking status: Former Smoker    Packs/day: 1.00    Years: 25.00    Pack years: 25.00    Types: Cigarettes    Quit date: 2000    Years since quitting: 21.4  . Smokeless tobacco: Current User    Types: Snuff  Substance and Sexual Activity  . Alcohol use: Yes    Alcohol/week: 14.0 standard drinks    Types: 14 Cans of beer per week    Comment: 2 beers QD  . Drug use: No  . Sexual activity: Not on file  Other Topics Concern  . Not on file  Social History Narrative   Lives with wife in a one story home.  Has 2 children.  Works as a Public relations account executive.  Education: high school.    Social  Determinants of Health   Financial Resource Strain:   . Difficulty of Paying Living Expenses:   Food Insecurity: No Food Insecurity  . Worried About Charity fundraiser in the Last Year: Never true  . Ran Out of Food in the Last Year: Never true  Transportation Needs: No Transportation Needs  . Lack of Transportation (Medical): No  . Lack of Transportation (Non-Medical): No  Physical Activity: Inactive  . Days of Exercise per Week: 0 days  . Minutes of Exercise per Session: 0 min  Stress: Stress Concern Present  . Feeling of Stress : To some extent  Social Connections:   . Frequency of Communication with Friends and Family:   . Frequency of Social Gatherings with Friends and Family:   . Attends Religious Services:   . Active Member of Clubs or Organizations:   . Attends Archivist Meetings:   [name] Marital Status:   Intimate Partner Violence:   . Fear of Current or Ex-Partner:   . Emotionally Abused:   [name] Physically Abused:   . Sexually Abused:      ROS- All systems are reviewed and negative except as per the HPI above.  Physical Exam: Vitals:   05/27/19 1433  BP: 118/80  Pulse: 71  Weight: (!) 150 kg  Height: 6' (1.829 m)    GEN- The patient is well appearing obese male, alert and oriented x 3 today.   HEENT-head normocephalic, atraumatic, sclera clear, conjunctiva pink, hearing intact, trachea midline. Lungs- Clear to ausculation bilaterally, normal work of breathing Heart- Regular rate and rhythm, no murmurs, rubs or gallops  GI- soft, NT, ND, + BS Extremities- no clubbing, cyanosis, or edema MS- no significant deformity or atrophy Skin- no rash or lesion Psych- euthymic mood, full affect Neuro- strength and sensation are intact   Wt Readings from Last 3 Encounters:  05/27/19 (!) 150 kg  05/20/19 (!) 148.4 kg  05/13/19 (!) 151.5 kg    EKG today demonstrates SR HR 71, NST, PR 180, QRS 104, QTc 430  Echo 09/30/18 demonstrated  1. Left ventricular  ejection fraction, by visual estimation, is 65 to  70%.  The left ventricle has normal function. Normal left ventricular size.  There is mildly increased left ventricular hypertrophy.  2. Definity contrast agent was given IV to delineate the left ventricular  endocardial borders.  3. Left ventricular diastolic Doppler parameters are indeterminate  pattern of LV diastolic filling.  4. Global right ventricle has normal systolic function.The right  ventricular size is normal. No increase in right ventricular wall  thickness.  5. The left ventricular function has improved from previous echo   Epic records are reviewed at length today  CHA2DS2-VASc Score = 3  The patient's score is based upon: CHF History: 1 HTN History: 1 Age : 0 Diabetes History: 0 Stroke History: 0 Vascular Disease History: 1 Gender: 0      ASSESSMENT AND PLAN: 1. Paroxysmal Atrial Fibrillation (ICD10:  I48.0) The patient's CHA2DS2-VASc score is 3, indicating a 3.2% annual risk of stroke.  Patient appears to be maintaining SR on AAD. Continue Multaq 400 mg BID. Continue Toprol 200 mg daily Continue Eliquis 5 mg BID Lifestyle modification was discussed and encouraged including alcohol reduction.   2. Secondary Hypercoagulable State (ICD10:  D68.69) The patient is at significant risk for stroke/thromboembolism based upon his CHA2DS2-VASc Score of 3.  Continue Apixaban (Eliquis).   3. Obesity Body mass index is 44.86 kg/m. Lifestyle modification was discussed and encouraged including regular physical activity and weight reduction.  4. CAD/Ischemic CM EF normalized on recent echo. No anginal symptoms.  5. Preoperative assessment  Patient doing well with Multaq with almost no further afib. No DCCV or ablation planned. Would be OK to hold anticoagulation from an afib standpoint. Will forward to Preop pool.   Follow up in the AF clinic in 3 months.    [facility] [address] [place], [place] [address] [phone] 05/27/2019 3:14 PM

## 2019-05-28 NOTE — Telephone Encounter (Signed)
Patient with diagnosis of multiple PE's, afib on Eliquis for anticoagulation.   Patient seen in A Fib clinic yesterday and cleared from an A Fib standpoint  Procedure: RT REVERSE SHOULDER ARTHROPLASTY Date of procedure: TBD  CHADS2-VASc score of  5 (CHF, HTN,  VTEx 2, CAD)  CrCl 124 ml/min  Due to patients hx of multiple PE/DVT he is high risk off anticoagulation.   Will defer to Dr Tamala Julian again due to new information from A Fib clinic

## 2019-06-02 NOTE — Telephone Encounter (Signed)
Let patient know A fib not well controlled and with h/o PE in past, holding anticoagulation exposes him to increased risk of embolic events(lungs and brain). That said, risk is still relatively low.Hard to put a specific level of risk but likely < 10%. That being understood by patient, okay to hold Apixaban for 48 hours prior to surgery, and resume when instructed by surgeon.If there is concern by patient or wife, I am happy to speak to them.

## 2019-06-03 NOTE — Telephone Encounter (Signed)
   Primary Cardiologist: Sinclair Grooms, MD  Chart reviewed as part of pre-operative protocol coverage. Patient was contacted 06/03/2019 in reference to pre-operative risk assessment for pending surgery as outlined below.  Travis Huff was last seen on 05/27/2019 by Malka So PA-C.  Since that day, Travis Huff has done well.  Therefore, based on ACC/AHA guidelines, the patient would be at acceptable risk for the planned procedure without further cardiovascular testing.   I will route this recommendation to the requesting party via Epic fax function and remove from pre-op pool. Please call with questions.  See below at Dr. Thompson Caul recommendation regarding [place], [place] 06/03/2019, 10:26 AM

## 2019-06-04 ENCOUNTER — Telehealth: Payer: Self-pay | Admitting: Cardiology

## 2019-06-04 MED ORDER — DILTIAZEM HCL 30 MG PO TABS: ORAL_TABLET | ORAL | 1 refills | Status: DC

## 2019-06-04 MED FILL — DILTIAZEM HCL 30 MG TABS: 30 | 7 days supply | Qty: 45 | Fill #0

## 2019-06-04 NOTE — Telephone Encounter (Signed)
Patient returning Sherri's call. 

## 2019-06-04 NOTE — Telephone Encounter (Signed)
Reports 2-3 AFib episodes since starting Multaq.  Brief episodes.   Travis Huff was a bad day and reported that he experienced palpitations and slight sob anytime he got up to do anything.  Episodes would last about 10 minutes and then stop. Today he felt it, but "no where near as bad".  Denies CP, edema, dizziness or syncope. Prior to yesterday the last episode occurred a week ago Monday and lasted about 15 minutes. He still drinks ~ 2 beers/day.  Pt aware this could be a trigger. Aware that I don't know that he will need to be seen in the office d/t yesterday's occurrence.  Aware it may be recommended to continue monitoring as seems to have resolved. Advised to contact office via Mychart/phone to report further occurrences so that we can determine if medication change needed. Aware I will forward to [facility] to review/advise as they started Multaq last month. Aware their office will contact him via Mychart/telephone to advise. Pt agreeable to plan.

## 2019-06-04 NOTE — Telephone Encounter (Signed)
Per Adline Peals PA continue Multaq - will call in PRN cardizem 30mg  tablets to use as needed. I have sent patient mychart message with infromation of usage. If episodes continue to increase in frequency/duration will discuss further changes at that time. Encouraged no more than 2 alcoholic drinks/week.

## 2019-06-04 NOTE — Telephone Encounter (Signed)
See today's telephone note for further documentation on this

## 2019-06-04 NOTE — Telephone Encounter (Signed)
lmtcb to arrange OV w/ Camnitz to further discuss/evaluate

## 2019-06-09 ENCOUNTER — Telehealth: Payer: Self-pay | Admitting: Sports Medicine

## 2019-06-09 NOTE — Telephone Encounter (Signed)
Travis Huff needs surgical clearance for shoulder arthroplasty, we did his preoperative risk assessment back on 05/13/2019, he was cleared for intermediate risk noncardiac surgery, it sounds as though he has already had clearance from a cardiovascular perspective and his arrhythmias from cardiology, I filled out his clearance letter today.

## 2019-06-09 NOTE — Telephone Encounter (Signed)
Done

## 2019-06-09 NOTE — Telephone Encounter (Signed)
Note fax number for surgical clearance in Aeden's message, I finished this today and put it in your box.  No rush.

## 2019-06-26 MED FILL — DULOXETINE HCL 60 MG CPEP: 60 | 90 days supply | Qty: 90 | Fill #1

## 2019-07-04 ENCOUNTER — Ambulatory Visit: Payer: 59 | Admitting: Sports Medicine

## 2019-07-07 ENCOUNTER — Other Ambulatory Visit: Payer: Self-pay | Admitting: Sports Medicine

## 2019-07-07 DIAGNOSIS — F5101 Primary insomnia: Secondary | ICD-10-CM

## 2019-07-07 MED FILL — LISINOPRIL-HYDROCHLOROTHIAZ: 20-25 | 90 days supply | Qty: 90 | Fill #1

## 2019-07-07 MED FILL — ELIQUIS 5 MG TABLET: 5 | 30 days supply | Qty: 60 | Fill #3

## 2019-07-08 ENCOUNTER — Other Ambulatory Visit: Payer: Self-pay | Admitting: Interventional Cardiology

## 2019-07-08 MED ORDER — ATORVASTATIN CALCIUM 80 MG PO TABS: 80.0000 mg | ORAL_TABLET | Freq: Every day | ORAL | 2 refills | Status: DC

## 2019-07-08 MED FILL — ZOLPIDEM TARTRATE 10 MG TAB: 10 | 30 days supply | Qty: 30 | Fill #0

## 2019-07-10 MED FILL — ATORVASTATIN 80 MG TABLET: 80 | 90 days supply | Qty: 90 | Fill #0 | Status: TO

## 2019-07-10 MED FILL — ATORVASTATIN 80 MG TABLET: 80 | 90 days supply | Qty: 90 | Fill #0

## 2019-07-14 MED FILL — MULTAQ 400 MG TABLET: 400 | 30 days supply | Qty: 60 | Fill #2

## 2019-07-28 MED FILL — CYCLOBENZAPRINE HCL 10 MG T: 10 | 23 days supply | Qty: 360 | Fill #2

## 2019-07-30 ENCOUNTER — Telehealth (HOSPITAL_COMMUNITY): Payer: Self-pay | Admitting: *Deleted

## 2019-07-30 ENCOUNTER — Ambulatory Visit (HOSPITAL_COMMUNITY)
Admission: RE | Admit: 2019-07-30 | Discharge: 2019-07-30 | Disposition: A | Discharge status: Home / Self Care | Payer: 59 | Source: Ambulatory Visit | Attending: Physician Assistant | Admitting: Physician Assistant

## 2019-07-30 ENCOUNTER — Other Ambulatory Visit: Payer: Self-pay

## 2019-07-30 DIAGNOSIS — I480 Paroxysmal atrial fibrillation: Secondary | ICD-10-CM

## 2019-07-30 NOTE — Telephone Encounter (Signed)
Pt called stating he is having increased amount of breakthrough afib - he's taking cardizem 30mg  tabs in advance of activity to prevent. Given instructions on proper use of PRN cardizem for only HRs sustaining over 100 - per [facility] PA will have pt wear a 3 day zio patch to help define afib burden on Multaq. Will be in touch with patient after results known.

## 2019-08-04 MED FILL — ELIQUIS 5 MG TABLET: 5 | 30 days supply | Qty: 60 | Fill #4

## 2019-08-11 MED FILL — MULTAQ 400 MG TABLET: 400 | 30 days supply | Qty: 60 | Fill #3

## 2019-08-14 MED FILL — ZOLPIDEM TARTRATE 10 MG TAB: 10 | 30 days supply | Qty: 30 | Fill #1

## 2019-08-20 DIAGNOSIS — I480 Paroxysmal atrial fibrillation: Secondary | ICD-10-CM | POA: Diagnosis not present

## 2019-08-28 ENCOUNTER — Encounter (HOSPITAL_COMMUNITY): Payer: Self-pay | Admitting: Physician Assistant

## 2019-08-28 ENCOUNTER — Other Ambulatory Visit: Payer: Self-pay

## 2019-08-28 ENCOUNTER — Ambulatory Visit (HOSPITAL_COMMUNITY)
Admission: RE | Admit: 2019-08-28 | Discharge: 2019-08-28 | Disposition: A | Discharge status: Home / Self Care | Payer: 59 | Source: Ambulatory Visit | Attending: Physician Assistant | Admitting: Physician Assistant

## 2019-08-28 VITALS — BP 126/80 | HR 63 | Ht 72.0 in | Wt 324.8 lb

## 2019-08-28 DIAGNOSIS — I252 Old myocardial infarction: Secondary | ICD-10-CM | POA: Insufficient documentation

## 2019-08-28 DIAGNOSIS — E669 Obesity, unspecified: Secondary | ICD-10-CM | POA: Insufficient documentation

## 2019-08-28 DIAGNOSIS — I480 Paroxysmal atrial fibrillation: Secondary | ICD-10-CM | POA: Diagnosis present

## 2019-08-28 DIAGNOSIS — I10 Essential (primary) hypertension: Secondary | ICD-10-CM | POA: Insufficient documentation

## 2019-08-28 DIAGNOSIS — Z7901 Long term (current) use of anticoagulants: Secondary | ICD-10-CM | POA: Insufficient documentation

## 2019-08-28 DIAGNOSIS — D6869 Other thrombophilia: Secondary | ICD-10-CM | POA: Insufficient documentation

## 2019-08-28 DIAGNOSIS — Z96643 Presence of artificial hip joint, bilateral: Secondary | ICD-10-CM | POA: Insufficient documentation

## 2019-08-28 DIAGNOSIS — I255 Ischemic cardiomyopathy: Secondary | ICD-10-CM | POA: Insufficient documentation

## 2019-08-28 DIAGNOSIS — Z96653 Presence of artificial knee joint, bilateral: Secondary | ICD-10-CM | POA: Diagnosis not present

## 2019-08-28 DIAGNOSIS — F17220 Nicotine dependence, chewing tobacco, uncomplicated: Secondary | ICD-10-CM | POA: Insufficient documentation

## 2019-08-28 DIAGNOSIS — Z6841 Body Mass Index (BMI) 40.0 and over, adult: Secondary | ICD-10-CM | POA: Insufficient documentation

## 2019-08-28 DIAGNOSIS — I2510 Atherosclerotic heart disease of native coronary artery without angina pectoris: Secondary | ICD-10-CM | POA: Diagnosis not present

## 2019-08-28 DIAGNOSIS — Z79899 Other long term (current) drug therapy: Secondary | ICD-10-CM | POA: Insufficient documentation

## 2019-08-28 MED ORDER — DILTIAZEM HCL ER COATED BEADS 120 MG PO CP24
120.0000 mg | ORAL_CAPSULE | Freq: Every day | ORAL | 2 refills | Status: DC
Start: 2019-08-28 — End: 2019-11-26

## 2019-08-28 MED FILL — CARTIA XT 120 MG CP24: 120 | 30 days supply | Qty: 30 | Fill #0

## 2019-08-28 NOTE — Patient Instructions (Signed)
Start cardizem 120mg  once a day

## 2019-08-28 NOTE — Progress Notes (Signed)
Primary Care Physician: Silverio Decamp, MD Primary Cardiologist: Dr Tamala Julian Primary Electrophysiologist: Dr Curt Bears Referring Physician: Dr Charm Barges is a 59 y.o. male with a history of HTN, h/o multiple Pes, NSTEMI, probable CA dissection, ischemic CM, paroxysmal atrial fibrillation who presents for follow up in the [facility]. Patient is on Eliquis for a CHADS2VASC score of 3. He was seen by Dr Curt Bears on 12/2018 and Dr Tamala Julian on 02/2019 and at that point he was having minimal afib symptoms. He has been maintained on metoprolol. Patient reports that for the last two months he has been having almost daily symptoms of heart racing, weakness, and nausea which are consistent with his afib episodes in the past. The episodes only last 10-15 minutes but he is very symptomatic. There are no triggers that the patient could identify. He denies significant snoring. He does admit to ~3 beers daily.   On follow up today, patient reports he has done reasonably well since his last visit. Patient wore a Zio patch which showed no atrial fibrillation. Patient triggered events were only associated with PVCs (1.7% PVC burden). He did take a 30 mg PRN dose of CCB and reports having no palpitations after taking it. Of note, he is scheduled for shoulder surgery on 09/18/19.   Today, he denies symptoms of chest pain, shortness of breath, orthopnea, PND, lower extremity edema, dizziness, presyncope, syncope, snoring, daytime somnolence, bleeding, or neurologic sequela. The patient is tolerating medications without difficulties and is otherwise without complaint today.    Atrial Fibrillation Risk Factors:  he does not have symptoms or diagnosis of sleep apnea. he does not have a history of rheumatic fever. he does have a history of alcohol use.  he has a BMI of Body mass index is 44.05 kg/m.[name] Filed Weights   08/28/19 1449  Weight: (!) 147.3 kg    Family History   Adopted: Yes  Problem Relation Age of Onset  . Healthy Daughter   . Healthy Daughter      Atrial Fibrillation Management history:  Previous antiarrhythmic drugs: Multaq Previous cardioversions: none Previous ablations: none CHADS2VASC score: 3 Anticoagulation history: Eliquis   Past Medical History:  Diagnosis Date  . Arrhythmia   . Arthritis   . Asthma    as child  . Avascular necrosis of bone of left hip ([place])   . Back pain   . Blood clot in vein 06/2016   x 2 no bleeding disorders found  . Complication of anesthesia    ketamine hallucinations  . Coronary artery disease   . Dislocated hip, left, initial encounter ([place]) 10/03/2018  . GERD (gastroesophageal reflux disease)   . Hypertension   . PAF (paroxysmal atrial fibrillation) ([place])   . Viral pericarditis 20 yrs ago   Past Surgical History:  Procedure Laterality Date  . ABDOMINAL EXPOSURE N/A 06/23/2016   Procedure: ABDOMINAL EXPOSURE;  Surgeon: Rosetta Posner, MD;  Location: [place];  Service: Vascular;  Laterality: N/A;  . ANTERIOR LATERAL LUMBAR FUSION 4 LEVELS Right 06/23/2016   Procedure: Right  Lumbar two-three Lumbar three-four Lumbar four-five Anterolateral lumbar interbody fusion;  Surgeon: Erline Levine, MD;  Location: [place];  Service: Neurosurgery;  Laterality: Right;  . ANTERIOR LUMBAR FUSION N/A 06/23/2016   Procedure: Lumbar five -sacral one  Anterior lumbar interbody fusion with Dr. Sherren Mocha Early for approach;  Surgeon: Erline Levine, MD;  Location: [place];  Service: Neurosurgery;  Laterality: N/A;  . APPLICATION  OF INTRAOPERATIVE CT SCAN N/A 06/26/2016   Procedure: APPLICATION OF INTRAOPERATIVE CT SCAN;  Surgeon: Erline Levine, MD;  Location: [place];  Service: Neurosurgery;  Laterality: N/A;  . CARDIAC CATHETERIZATION    . CARDIOVASCULAR STRESS TEST     Negative in May 2014  . COLONOSCOPY    . HERNIA REPAIR     umbilical  . HIP CLOSED REDUCTION Left 10/03/2018   Procedure: CLOSED REDUCTION HIP;  Surgeon:  Rod Can, MD;  Location: [facility];  Service: Orthopedics;  Laterality: Left;  . IR IVC FILTER PLMT / S&I Burke Keels GUID/MOD SED  03/26/2018  . IR RADIOLOGIST EVAL & MGMT  04/16/2019  . JOINT REPLACEMENT    . LEFT HEART CATH AND CORONARY ANGIOGRAPHY N/A 06/21/2018   Procedure: LEFT HEART CATH AND CORONARY ANGIOGRAPHY;  Surgeon: Jettie Booze, MD;  Location: [place] [facility];  Service: Cardiovascular;  Laterality: N/A;  . POSTERIOR LUMBAR FUSION 4 LEVEL N/A 06/26/2016   Procedure: Thoracic ten to Pelvis fixation with AIRO;  Surgeon: Erline Levine, MD;  Location: [place];  Service: Neurosurgery;  Laterality: N/A;  . TENDON REPAIR     right hand  . TOTAL HIP ARTHROPLASTY Right 05/25/2014   Procedure: RIGHT TOTAL HIP ARTHROPLASTY ANTERIOR APPROACH;  Surgeon: Rod Can, MD;  Location: [place];  Service: Orthopedics;  Laterality: Right;  . TOTAL HIP ARTHROPLASTY Left 03/27/2018   Procedure: TOTAL HIP ARTHROPLASTY ANTERIOR APPROACH- DA complex;  Surgeon: Rod Can, MD;  Location: [facility];  Service: Orthopedics;  Laterality: Left;  . TOTAL KNEE ARTHROPLASTY Bilateral     Current Outpatient Medications  Medication Sig Dispense Refill  . acetaminophen (TYLENOL) 325 MG tablet Take 650 mg by mouth every 6 (six) hours as needed for mild pain or headache.    . Ascorbic Acid (VITAMIN C) 500 MG CAPS Take 1 capsule by mouth daily.    [name] atorvastatin (LIPITOR) 80 MG tablet Take 1 tablet (80 mg total) by mouth daily at 6 PM. 90 tablet 2  . cyclobenzaprine (FLEXERIL) 10 MG tablet TAKE 2 TABLETS (20 MG TOTAL) BY MOUTH 2 (TWO) TIMES DAILY. 360 tablet 3  . diltiazem (CARDIZEM) 30 MG tablet Take 1 tablet every 4 hours AS NEEDED for heart rate >100 as long as top blood pressure >100. 45 tablet 1  . docusate sodium (COLACE) 100 MG capsule Take 1 capsule (100 mg total) by mouth 2 (two) times daily. 60 capsule 1  . dronedarone (MULTAQ) 400 MG tablet Take 1 tablet (400 mg total) by mouth 2 (two) times daily with  a meal. 60 tablet 3  . DULoxetine (CYMBALTA) 60 MG capsule TAKE 1 CAPSULE (60 MG TOTAL) BY MOUTH AT BEDTIME. 90 capsule 3  . ELIQUIS 5 MG TABS tablet TAKE 1 TABLET BY MOUTH TWICE DAILY 60 tablet 5  . lisinopril-hydrochlorothiazide (ZESTORETIC) 20-25 MG tablet Take 1 tablet by mouth daily. 90 tablet 3  . metoprolol succinate (TOPROL-XL) 100 MG 24 hr tablet Take 2 tablets (200 mg total) by mouth daily. Take 2 tablets by mouth in the morning 180 tablet 3  . Multiple Vitamin (DAILY VITAMIN PO) Taking one tablet by mouth daily- Calcium, magnesium and zince tablet    . nitroGLYCERIN (NITROSTAT) 0.4 MG SL tablet Place 1 tablet (0.4 mg total) under the tongue every 5 (five) minutes x 3 doses as needed for chest pain. If taking third dose, call 911. 25 tablet 3  . polyethylene glycol (MIRALAX / GLYCOLAX) 17 g packet Take 17 g by  mouth every other day.     . terbinafine (LAMISIL) 250 MG tablet TAKE 1 TABLET (250 MG TOTAL) BY MOUTH DAILY. 90 tablet 1  . zolpidem (AMBIEN) 10 MG tablet TAKE 1 TABLET BY MOUTH EVERY NIGHT AT BEDTIME AS NEEDED FOR SLEEP 30 tablet 1  . diltiazem (CARDIZEM CD) 120 MG 24 hr capsule Take 1 capsule (120 mg total) by mouth daily. 30 capsule 2   No current facility-administered medications for this encounter.    Allergies  Allergen Reactions  . Ketamine     hallucinations  . Morphine And Related Itching    Social History   Socioeconomic History  . Marital status: Married    Spouse name: [name]  . Number of children: 2  . Years of education: 29  . Highest education level: Not on file  Occupational History  . Occupation: Public relations account executive  Tobacco Use  . Smoking status: Former Smoker    Packs/day: 1.00    Years: 25.00    Pack years: 25.00    Types: Cigarettes    Quit date: 2000    Years since quitting: 21.6  . Smokeless tobacco: Current User    Types: Snuff  Vaping Use  . Vaping Use: Never used  Substance and Sexual Activity  . Alcohol use: Yes    Alcohol/week: 14.0  standard drinks    Types: 14 Cans of beer per week    Comment: 2 beers QD  . Drug use: No  . Sexual activity: Not on file  Other Topics Concern  . Not on file  Social History Narrative   Lives with wife in a one story home.  Has 2 children.  Works as a Public relations account executive.  Education: high school.    Social Determinants of Health   Financial Resource Strain:   . Difficulty of Paying Living Expenses: Not on file  Food Insecurity: No Food Insecurity  . Worried About Charity fundraiser in the Last Year: Never true  . Ran Out of Food in the Last Year: Never true  Transportation Needs: No Transportation Needs  . Lack of Transportation (Medical): No  . Lack of Transportation (Non-Medical): No  Physical Activity: Inactive  . Days of Exercise per Week: 0 days  . Minutes of Exercise per Session: 0 min  Stress: Stress Concern Present  . Feeling of Stress : To some extent  Social Connections:   . Frequency of Communication with Friends and Family: Not on file  . Frequency of Social Gatherings with Friends and Family: Not on file  . Attends Religious Services: Not on file  . Active Member of Clubs or Organizations: Not on file  . Attends Archivist Meetings: Not on file  . Marital Status: Not on file  Intimate Partner Violence:   . Fear of Current or Ex-Partner: Not on file  . Emotionally Abused: Not on file  . Physically Abused: Not on file  . Sexually Abused: Not on file     ROS- All systems are reviewed and negative except as per the HPI above.  Physical Exam: Vitals:   08/28/19 1449  BP: 126/80  Pulse: 63  Weight: (!) 147.3 kg  Height: 6' (1.829 m)    GEN- The patient is well appearing obese male, alert and oriented x 3 today.   HEENT-head normocephalic, atraumatic, sclera clear, conjunctiva pink, hearing intact, trachea midline. Lungs- Clear to ausculation bilaterally, normal work of breathing Heart- Regular rate and rhythm, no murmurs, rubs or gallops  GI-  soft,  NT, ND, + BS Extremities- no clubbing, cyanosis, or edema MS- no significant deformity or atrophy Skin- no rash or lesion Psych- euthymic mood, full affect Neuro- strength and sensation are intact   Wt Readings from Last 3 Encounters:  08/28/19 (!) 147.3 kg  05/27/19 (!) 150 kg  05/20/19 (!) 148.4 kg    EKG today demonstrates SR HR 63, inc RBBB, PR 182, QRS 98, QTc 407  Echo 09/30/18 demonstrated  1. Left ventricular ejection fraction, by visual estimation, is 65 to  70%. The left ventricle has normal function. Normal left ventricular size.  There is mildly increased left ventricular hypertrophy.  2. Definity contrast agent was given IV to delineate the left ventricular  endocardial borders.  3. Left ventricular diastolic Doppler parameters are indeterminate  pattern of LV diastolic filling.  4. Global right ventricle has normal systolic function.The right  ventricular size is normal. No increase in right ventricular wall  thickness.  5. The left ventricular function has improved from previous echo   Epic records are reviewed at length today  CHA2DS2-VASc Score = 3  The patient's score is based upon: CHF History: 1 HTN History: 1 Age : 0 Diabetes History: 0 Stroke History: 0 Vascular Disease History: 1 Gender: 0      ASSESSMENT AND PLAN: 1. Paroxysmal Atrial Fibrillation (ICD10:  I48.0) The patient's CHA2DS2-VASc score is 3, indicating a 3.2% annual risk of stroke.  Zio patch 7/28 showed no afib.  Continue Multaq 400 mg BID. Continue Toprol 200 mg daily Continue Eliquis 5 mg BID Given improvement in his palpitations with PRN diltiazem, will start diltiazem 120 mg daily. Patient to check BP and heart rate daily and call clinic back with update.   2. Secondary Hypercoagulable State (ICD10:  D68.69) The patient is at significant risk for stroke/thromboembolism based upon his CHA2DS2-VASc Score of 3.  Continue Apixaban (Eliquis).   3. Obesity Body mass index is  44.05 kg/m. Lifestyle modification was discussed and encouraged including regular physical activity and weight reduction.  4. CAD/Ischemic CM EF normalized on recent echo. No anginal symptoms.   Follow up with Dr Tamala Julian as scheduled and Dr Curt Bears per recall. AF clinic in 6 months.    [facility] [address] [place], [place] [address] [phone] 08/28/2019 4:06 PM

## 2019-09-01 ENCOUNTER — Other Ambulatory Visit: Payer: Self-pay | Admitting: Sports Medicine

## 2019-09-01 MED FILL — ELIQUIS 5 MG TABLET: 5 | 30 days supply | Qty: 60 | Fill #5

## 2019-09-05 NOTE — Patient Instructions (Signed)
DUE TO COVID-19 ONLY ONE VISITOR IS ALLOWED TO COME WITH YOU AND STAY IN THE WAITING ROOM ONLY DURING  PRE OP AND PROCEDURE.   IF YOU WILL BE ADMITTED INTO THE HOSPITAL YOU ARE ALLOWED ONE SUPPORT PERSON DURING VISITATION HOURS  ONLY (10AM -8PM)   . The support person may change daily. . The support person must pass our screening, gel in and out, and wear a mask at all times, including in the patient's room. . Patients must also wear a mask when staff or their support person are in the room.   COVID SWAB TESTING MUST BE COMPLETED ON:  Monday, 09-15-19 @    [address] [place], [place] [address]  (Must self quarantine after testing. Follow instructions on handout.)        Your procedure is scheduled on:  Thursday, 09-18-19   Report to [facility] Main  Entrance   Report to admitting at 7:30 AM   Call this number if you have problems the morning of surgery [phone]   Do not eat food :After Midnight.   May have liquids until 7:00 AM   day of surgery   CLEAR LIQUID DIET  Foods Allowed                                                                     Foods Excluded  Water, Black Coffee and tea, regular and decaf           liquids that you cannot  Plain Jell-O in any flavor  (No red)                                  see through such as: Fruit ices (not with fruit pulp)                                      milk, soups, orange juice              Iced Popsicles (No red)                                      All solid food                                   Apple juices Sports drinks like Gatorade (No red) Lightly seasoned clear broth or consume(fat free) Sugar, honey syrup     Complete one Ensure drink the morning of surgery at 7:00 AM the day of surgery.    Oral Hygiene is also important to reduce your risk of infection.                                    Remember - BRUSH YOUR TEETH THE MORNING OF SURGERY WITH YOUR REGULAR TOOTHPASTE   Do NOT smoke after  Midnight   Take these medicines the morning of surgery  with A SIP OF WATER: Diltiazem, Dronedarone, Metoprolol                                You may not have any metal on your body including jewelry, and body piercings             Do not wear lotions, powders, perfumes/cologne, or deodorant             Men may shave face and neck.   Do not bring valuables to the hospital. [place].   Contacts, dentures or bridgework may not be worn into surgery.   Bring small overnight bag day of surgery.                   Please read over the following fact sheets you were given: IF YOU HAVE QUESTIONS ABOUT YOUR PRE OP INSTRUCTIONS  PLEASE CALL [place]- Preparing for Total Shoulder Arthroplasty    Before surgery, you can play an important role. Because skin is not sterile, your skin needs to be as free of germs as possible. You can reduce the number of germs on your skin by using the following products. . Benzoyl Peroxide Gel o Reduces the number of germs present on the skin o Applied twice a day to shoulder area starting two days before surgery    ==================================================================  Please follow these instructions carefully:  BENZOYL PEROXIDE 5% GEL  Please do not use if you have an allergy to benzoyl peroxide.   If your skin becomes reddened/irritated stop using the benzoyl peroxide.  Starting two days before surgery, apply as follows: 1. Apply benzoyl peroxide in the morning and at night. Apply after taking a shower. If you are not taking a shower clean entire shoulder front, back, and side along with the armpit with a clean wet washcloth.  2. Place a quarter-sized dollop on your shoulder and rub in thoroughly, making sure to cover the front, back, and side of your shoulder, along with the armpit.   2 days before ____ AM   ____ PM              1 day before ____ AM   ____ PM                          3. Do this twice a day for two days.  (Last application is the night before surgery, AFTER using the CHG soap as described below).  4. Do NOT apply benzoyl peroxide gel on the day of surgery.   [facility] - Preparing for Surgery Before surgery, you can play an important role.  Because skin is not sterile, your skin needs to be as free of germs as possible.  You can reduce the number of germs on your skin by washing with CHG (chlorahexidine gluconate) soap before surgery.  CHG is an antiseptic cleaner which kills germs and bonds with the skin to continue killing germs even after washing. Please DO NOT use if you have an allergy to CHG or antibacterial soaps.  If your skin becomes reddened/irritated stop using the CHG and inform your nurse when you arrive at Short Stay. Do not shave (including legs and underarms) for at least 48 hours prior to the first CHG shower.  You may shave your face/neck.  Please follow these instructions carefully:  1.  Shower with CHG Soap the night before surgery and the  morning of surgery.  2.  If you choose to wash your hair, wash your hair first as usual with your normal  shampoo.  3.  After you shampoo, rinse your hair and body thoroughly to remove the shampoo.                             4.  Use CHG as you would any other liquid soap.  You can apply chg directly to the skin and wash.  Gently with a scrungie or clean washcloth.  5.  Apply the CHG Soap to your body ONLY FROM THE NECK DOWN.   Do   not use on face/ open                           Wound or open sores. Avoid contact with eyes, ears mouth and   genitals (private parts).                       Wash face,  Genitals (private parts) with your normal soap.             6.  Wash thoroughly, paying special attention to the area where your    surgery  will be performed.  7.  Thoroughly rinse your body with warm water from the neck down.  8.  DO NOT shower/wash with your normal soap after using and rinsing off the  CHG Soap.                9.  Pat yourself dry with a clean towel.            10.  Wear clean pajamas.            11.  Place clean sheets on your bed the night of your first shower and do not  sleep with pets. Day of Surgery : Do not apply any lotions/deodorants the morning of surgery.  Please wear clean clothes to the hospital/surgery center.  FAILURE TO FOLLOW THESE INSTRUCTIONS MAY RESULT IN THE CANCELLATION OF YOUR SURGERY  PATIENT SIGNATURE_________________________________  NURSE SIGNATURE__________________________________  ________________________________________________________________________   Travis Huff  An incentive spirometer is a tool that can help keep your lungs clear and active. This tool measures how well you are filling your lungs with each breath. Taking long deep breaths may help reverse or decrease the chance of developing breathing (pulmonary) problems (especially infection) following:  A long period of time when you are unable to move or be active. BEFORE THE PROCEDURE   If the spirometer includes an indicator to show your best effort, your nurse or respiratory therapist will set it to a desired goal.  If possible, sit up straight or lean slightly forward. Try not to slouch.  Hold the incentive spirometer in an upright position. INSTRUCTIONS FOR USE  1. Sit on the edge of your bed if possible, or sit up as far as you can in bed or on a chair. 2. Hold the incentive spirometer in an upright position. 3. Breathe out normally. 4. Place the mouthpiece in your mouth and seal your lips tightly around it. 5. Breathe in slowly and as deeply as possible, raising the piston or the ball toward the top of the column. 6. Hold your breath for 3-5 seconds or for as long as possible. Allow the  piston or ball to fall to the bottom of the column. 7. Remove the mouthpiece from your mouth and breathe out normally. 8. Rest for a few seconds and repeat Steps 1 through 7  at least 10 times every 1-2 hours when you are awake. Take your time and take a few normal breaths between deep breaths. 9. The spirometer may include an indicator to show your best effort. Use the indicator as a goal to work toward during each repetition. 10. After each set of 10 deep breaths, practice coughing to be sure your lungs are clear. If you have an incision (the cut made at the time of surgery), support your incision when coughing by placing a pillow or rolled up towels firmly against it. Once you are able to get out of bed, walk around indoors and cough well. You may stop using the incentive spirometer when instructed by your caregiver.  RISKS AND COMPLICATIONS  Take your time so you do not get dizzy or light-headed.  If you are in pain, you may need to take or ask for pain medication before doing incentive spirometry. It is harder to take a deep breath if you are having pain. AFTER USE  Rest and breathe slowly and easily.  It can be helpful to keep track of a log of your progress. Your caregiver can provide you with a simple table to help with this. If you are using the spirometer at home, follow these instructions: [place] IF:   You are having difficultly using the spirometer.  You have trouble using the spirometer as often as instructed.  Your pain medication is not giving enough relief while using the spirometer.  You develop fever of 100.5 F (38.1 C) or higher. SEEK IMMEDIATE MEDICAL CARE IF:   You cough up bloody sputum that had not been present before.  You develop fever of 102 F (38.9 C) or greater.  You develop worsening pain at or near the incision site. MAKE SURE YOU:   Understand these instructions.  Will watch your condition.  Will get help right away if you are not doing well or get worse. Document Released: 05/01/2006 Document Revised: 03/13/2011 Document Reviewed: 07/02/2006 [facility] Patient Information 2014 [place],  [place].   ________________________________________________________________________

## 2019-09-05 NOTE — Progress Notes (Addendum)
COVID Vaccine Completed: Date COVID Vaccine completed: COVID vaccine manufacturer: [place]   PCP - Aundria Mems, MD.  Medical clearance on chart dated 06-09-19  Cardiologist - Pernell Dupre, MD.  Cardiac clearance on chart dated 06-03-19. OV 08-27-19  Electrophysiology - Will Curt Bears, MD  Chest x-ray -  EKG - 08-28-19 in Epic Stress Test -  ECHO - 09-30-18 in Epic Cardiac Cath - 06-21-18 in [facility] - 07-30-19 in Epic  Sleep Study -  CPAP -   Fasting Blood Sugar -  Checks Blood Sugar _____ times a day  Blood Thinner Instructions:  Eliquis 5 mg Aspirin Instructions: Last Dose:  Anesthesia review:  Afib, cardomyopathy, PVCs,  CAD, NSTEMI, multiple PE's  Patient denies shortness of breath, fever, cough and chest pain at PAT appointment   Patient verbalized understanding of instructions that were given to them at the PAT appointment. Patient was also instructed that they will need to review over the PAT instructions again at home before surgery.

## 2019-09-09 ENCOUNTER — Other Ambulatory Visit (HOSPITAL_COMMUNITY): Payer: Self-pay | Admitting: Physician Assistant

## 2019-09-09 MED FILL — METOPROLOL SUCCINATE ER 100: 100 | 90 days supply | Qty: 180 | Fill #3

## 2019-09-09 MED FILL — MULTAQ 400 MG TABLET: 400 | 30 days supply | Qty: 60 | Fill #0

## 2019-09-10 ENCOUNTER — Encounter (HOSPITAL_COMMUNITY)
Admission: RE | Admit: 2019-09-10 | Discharge: 2019-09-10 | Disposition: A | Discharge status: Home / Self Care | Payer: 59 | Source: Ambulatory Visit | Attending: Anesthesiology | Admitting: Anesthesiology

## 2019-09-12 ENCOUNTER — Other Ambulatory Visit: Payer: Self-pay

## 2019-09-12 ENCOUNTER — Encounter (HOSPITAL_COMMUNITY)
Admission: RE | Admit: 2019-09-12 | Discharge: 2019-09-12 | Disposition: A | Discharge status: Home / Self Care | Payer: 59 | Source: Ambulatory Visit | Attending: Orthopedic Surgery | Admitting: Orthopedic Surgery

## 2019-09-12 ENCOUNTER — Encounter (HOSPITAL_COMMUNITY): Payer: Self-pay

## 2019-09-12 DIAGNOSIS — Z01812 Encounter for preprocedural laboratory examination: Secondary | ICD-10-CM | POA: Diagnosis present

## 2019-09-12 HISTORY — DX: Myoneural disorder, unspecified: G70.9

## 2019-09-12 LAB — SURGICAL PCR SCREEN
MRSA, PCR: NEGATIVE
Staphylococcus aureus: NEGATIVE

## 2019-09-12 LAB — CBC
HCT: 48.6 % (ref 39.0–52.0)
Hemoglobin: 16.2 g/dL (ref 13.0–17.0)
MCH: 32.2 pg (ref 26.0–34.0)
MCHC: 33.3 g/dL (ref 30.0–36.0)
MCV: 96.6 fL (ref 80.0–100.0)
Platelets: 200 10*3/uL (ref 150–400)
RBC: 5.03 MIL/uL (ref 4.22–5.81)
RDW: 13.6 % (ref 11.5–15.5)
WBC: 6.4 10*3/uL (ref 4.0–10.5)
nRBC: 0 % (ref 0.0–0.2)

## 2019-09-12 LAB — BASIC METABOLIC PANEL
Anion gap: 9 (ref 5–15)
BUN: 17 mg/dL (ref 6–20)
CO2: 29 mmol/L (ref 22–32)
Calcium: 9.2 mg/dL (ref 8.9–10.3)
Chloride: 101 mmol/L (ref 98–111)
Creatinine, Ser: 0.97 mg/dL (ref 0.61–1.24)
GFR calc Af Amer: >60 mL/min (ref >60)
GFR calc non Af Amer: >60 mL/min (ref >60)
Glucose, Bld: 90 mg/dL (ref 70–99)
Potassium: 4.2 mmol/L (ref 3.5–5.1)
Sodium: 139 mmol/L (ref 135–145)

## 2019-09-12 NOTE — Progress Notes (Signed)
COVID Vaccine Completed:Yes Date COVID Vaccine completed:09/11/19 COVID vaccine manufacturer:   [place]      PCP -  Cardiologist -   Chest x-ray -  EKG -  Stress Test -  ECHO -  Cardiac Cath -  Pacemaker/ICD device last checked:  Sleep Study - No CPAP -   Fasting Blood Sugar - NA Checks Blood Sugar _____ times a day  Blood Thinner Instructions:Eliquis/Dr. Tamala Julian Aspirin Instructions:Stop 2 days prior to DOS/ Dr. Tamala Julian Last Dose:09/15/19  Anesthesia review:   Patient denies shortness of breath, fever, cough and chest pain at PAT appointment  Yes  Patient verbalized understanding of instructions that were given to them at the PAT appointment. Patient was also instructed that they will need to review over the PAT instructions again at home before surgery. Yes  Pt can climb 1 flight of stairs before he is SOB but none with ADLs. He uses a cane for neuropathy from the knee down on both legs from a back surgery. He has a IVC filter placed in   March of 2020.

## 2019-09-12 NOTE — Patient Instructions (Addendum)
DUE TO COVID-19 ONLY ONE VISITOR IS ALLOWED TO COME WITH YOU AND STAY IN THE WAITING ROOM ONLY DURING PRE OP AND PROCEDURE DAY OF SURGERY. THE 1 VISITOR  MAY VISIT WITH YOU AFTER SURGERY IN YOUR PRIVATE ROOM DURING VISITING HOURS ONLY!  YOU NEED TO HAVE A COVID 19 TEST ON__9/13_____ @_2 :05______, THIS TEST MUST BE DONE BEFORE SURGERY,  COVID TESTING SITE [address] [place] [address], IT IS ON THE RIGHT GOING OUT [address] APPROXIMATELY  2 MINUTES PAST [employer] ON THE RIGHT. ONCE YOUR COVID TEST IS COMPLETED,  PLEASE BEGIN THE QUARANTINE INSTRUCTIONS AS OUTLINED IN YOUR HANDOUT.                HEITOR STEINHOFF    Your procedure is scheduled on: 09/18/19   Report to [facility] Main  Entrance   Report to admitting at  7:30 AM     Call this number if you have problems the morning of surgery [name], NO CHEWING GUM [place].   No food after midnight.    You may have clear liquid until 7:00 AM.    At 7:00 AM drink pre surgery drink  . Nothing by mouth after 7:00 AM.    Take these medicines the morning of surgery with A SIP OF WATER: Diltiazem, Metoprolo, Multaq                                 You may not have any metal on your body including              piercings  Do not wear jewelry, , lotions, powders or deodorant              Men may shave face and neck.   Do not bring valuables to the hospital. [place].  Contacts, dentures or bridgework may not be worn into surgery.      Patients discharged the day of surgery will not be allowed to drive home  . IF YOU ARE HAVING SURGERY AND GOING HOME THE SAME DAY, YOU MUST HAVE AN ADULT TO DRIVE YOU HOME AND BE WITH YOU FOR 24 HOURS. YOU MAY GO HOME BY TAXI OR UBER OR ORTHERWISE, BUT AN ADULT MUST ACCOMPANY YOU HOME AND STAY WITH YOU FOR 24 HOURS.  Name and phone number of  your driver:  Special Instructions: N/A              Please read over the following fact sheets you were given: _____________________________________________________________________             [facility]- Preparing for Total Shoulder Arthroplasty    Before surgery, you can play an important role. Because skin is not sterile, your skin needs to be as free of germs as possible. You can reduce the number of germs on your skin by using the following products. . Benzoyl Peroxide Gel o Reduces the number of germs present on the skin o Applied twice a day to shoulder area starting two days before surgery    ==================================================================  Please follow these instructions carefully:  BENZOYL PEROXIDE 5% GEL  Please do not use if you have an allergy to benzoyl peroxide.   If  your skin becomes reddened/irritated stop using the benzoyl peroxide.  Starting two days before surgery, apply as follows: 1. Apply benzoyl peroxide in the morning and at night. Apply after taking a shower. If you are not taking a shower clean entire shoulder front, back, and side along with the armpit with a clean wet washcloth.  2. Place a quarter-sized dollop on your shoulder and rub in thoroughly, making sure to cover the front, back, and side of your shoulder, along with the armpit.   2 days before ____ AM   ____ PM              1 day before ____ AM   ____ PM                         3. Do this twice a day for two days.  (Last application is the night before surgery, AFTER using the CHG soap as described below).  4. Do NOT apply benzoyl peroxide gel on the day of surgery. [place] [facility] - Preparing for Surgery Before surgery, you can play an important role.  Because skin is not sterile, your skin needs to be as free of germs as possible.  You can reduce the number of germs on your skin by washing with CHG (chlorahexidine gluconate) soap before surgery.  CHG is an antiseptic cleaner  which kills germs and bonds with the skin to continue killing germs even after washing. Please DO NOT use if you have an allergy to CHG or antibacterial soaps.  If your skin becomes reddened/irritated stop using the CHG and inform your nurse when you arrive at Short Stay. Do not shave (including legs and underarms) for at least 48 hours prior to the first CHG shower.  You may shave your face/neck. Please follow these instructions carefully:  1.  Shower with CHG Soap the night before surgery and the  morning of Surgery.  2.  If you choose to wash your hair, wash your hair first as usual with your  normal  shampoo.  3.  After you shampoo, rinse your hair and body thoroughly to remove the  shampoo.                                        4.  Use CHG as you would any other liquid soap.  You can apply chg directly  to the skin and wash                       Gently with a scrungie or clean washcloth.  5.  Apply the CHG Soap to your body ONLY FROM THE NECK DOWN.   Do not use on face/ open                           Wound or open sores. Avoid contact with eyes, ears mouth and genitals (private parts).                       Wash face,  Genitals (private parts) with your normal soap.             6.  Wash thoroughly, paying special attention to the area where your surgery  will be performed.  7.  Thoroughly rinse your body with warm water from the neck  down.  8.  DO NOT shower/wash with your normal soap after using and rinsing off  the CHG Soap.             9.  Pat yourself dry with a clean towel.            10.  Wear clean pajamas.            11.  Place clean sheets on your bed the night of your first shower and do not  sleep with pets. Day of Surgery : Do not apply any lotions/deodorants the morning of surgery.  Please wear clean clothes to the hospital/surgery center.  FAILURE TO FOLLOW THESE INSTRUCTIONS MAY RESULT IN THE CANCELLATION OF YOUR SURGERY PATIENT  SIGNATURE_________________________________  NURSE SIGNATURE__________________________________  ________________________________________________________________________   Adam Phenix  An incentive spirometer is a tool that can help keep your lungs clear and active. This tool measures how well you are filling your lungs with each breath. Taking long deep breaths may help reverse or decrease the chance of developing breathing (pulmonary) problems (especially infection) following:  A long period of time when you are unable to move or be active. BEFORE THE PROCEDURE   If the spirometer includes an indicator to show your best effort, your nurse or respiratory therapist will set it to a desired goal.  If possible, sit up straight or lean slightly forward. Try not to slouch.  Hold the incentive spirometer in an upright position. INSTRUCTIONS FOR USE  1. Sit on the edge of your bed if possible, or sit up as far as you can in bed or on a chair. 2. Hold the incentive spirometer in an upright position. 3. Breathe out normally. 4. Place the mouthpiece in your mouth and seal your lips tightly around it. 5. Breathe in slowly and as deeply as possible, raising the piston or the ball toward the top of the column. 6. Hold your breath for 3-5 seconds or for as long as possible. Allow the piston or ball to fall to the bottom of the column. 7. Remove the mouthpiece from your mouth and breathe out normally. 8. Rest for a few seconds and repeat Steps 1 through 7 at least 10 times every 1-2 hours when you are awake. Take your time and take a few normal breaths between deep breaths. 9. The spirometer may include an indicator to show your best effort. Use the indicator as a goal to work toward during each repetition. 10. After each set of 10 deep breaths, practice coughing to be sure your lungs are clear. If you have an incision (the cut made at the time of surgery), support your incision when coughing  by placing a pillow or rolled up towels firmly against it. Once you are able to get out of bed, walk around indoors and cough well. You may stop using the incentive spirometer when instructed by your caregiver.  RISKS AND COMPLICATIONS  Take your time so you do not get dizzy or light-headed.  If you are in pain, you may need to take or ask for pain medication before doing incentive spirometry. It is harder to take a deep breath if you are having pain. AFTER USE  Rest and breathe slowly and easily.  It can be helpful to keep track of a log of your progress. Your caregiver can provide you with a simple table to help with this. If you are using the spirometer at home, follow these instructions: [place] IF:   You  are having difficultly using the spirometer.  You have trouble using the spirometer as often as instructed.  Your pain medication is not giving enough relief while using the spirometer.  You develop fever of 100.5 F (38.1 C) or higher. SEEK IMMEDIATE MEDICAL CARE IF:   You cough up bloody sputum that had not been present before.  You develop fever of 102 F (38.9 C) or greater.  You develop worsening pain at or near the incision site. MAKE SURE YOU:   Understand these instructions.  Will watch your condition.  Will get help right away if you are not doing well or get worse. Document Released: 05/01/2006 Document Revised: 03/13/2011 Document Reviewed: 07/02/2006 [facility] Patient Information 2014 [place], [place].   ________________________________________________________________________

## 2019-09-15 ENCOUNTER — Other Ambulatory Visit (HOSPITAL_COMMUNITY)
Admission: RE | Admit: 2019-09-15 | Discharge: 2019-09-15 | Disposition: A | Discharge status: Home / Self Care | Payer: 59 | Source: Ambulatory Visit | Attending: Orthopedic Surgery | Admitting: Orthopedic Surgery

## 2019-09-15 ENCOUNTER — Other Ambulatory Visit: Payer: Self-pay | Admitting: Sports Medicine

## 2019-09-15 DIAGNOSIS — Z01812 Encounter for preprocedural laboratory examination: Secondary | ICD-10-CM | POA: Insufficient documentation

## 2019-09-15 DIAGNOSIS — Z20822 Contact with and (suspected) exposure to covid-19: Secondary | ICD-10-CM | POA: Insufficient documentation

## 2019-09-15 DIAGNOSIS — F5101 Primary insomnia: Secondary | ICD-10-CM

## 2019-09-15 LAB — SARS CORONAVIRUS 2 (TAT 6-24 HRS): SARS Coronavirus 2: NEGATIVE

## 2019-09-15 MED FILL — ZOLPIDEM TARTRATE 10 MG TAB: 10 | 30 days supply | Qty: 30 | Fill #0

## 2019-09-15 NOTE — Anesthesia Preprocedure Evaluation (Addendum)
Anesthesia Evaluation  Patient identified by MRN, date of birth, ID band Patient awake    Reviewed: Allergy & Precautions, NPO status , Patient's Chart, lab work & pertinent test results  History of Anesthesia Complications Negative for: history of anesthetic complications  Airway Mallampati: III  TM Distance: >3 FB Neck ROM: Full    Dental no notable dental hx. (+) Dental Advisory Given   Pulmonary neg pulmonary ROS, former smoker,    Pulmonary exam normal        Cardiovascular hypertension, Pt. on medications and Pt. on home beta blockers + Past MI  Normal cardiovascular exam+ dysrhythmias Atrial Fibrillation  Rhythm:Regular Rate:Normal  Echo 09/30/2018 IMPRESSIONS    1. Left ventricular ejection fraction, by visual estimation, is 65 to  70%. The left ventricle has normal function. Normal left ventricular size.  There is mildly increased left ventricular hypertrophy.  2. Definity contrast agent was given IV to delineate the left ventricular  endocardial borders.  3. Left ventricular diastolic Doppler parameters are indeterminate  pattern of LV diastolic filling.  4. Global right ventricle has normal systolic function.The right  ventricular size is normal. No increase in right ventricular wall  thickness.  5. The left ventricular function has improved from previous echo  Cardiac Cath 06/21/2018  Dist LAD lesion is 75% stenosed. 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  There is mild left ventricular systolic dysfunction.  The left ventricular ejection fraction is 35-45% by visual estimate.  There is no aortic valve stenosis.   Neuro/Psych PSYCHIATRIC DISORDERS Anxiety negative neurological ROS     GI/Hepatic negative GI ROS, Neg liver ROS,   Endo/Other  negative endocrine ROS  Renal/GU negative Renal ROS     Musculoskeletal  (+) Arthritis ,   Abdominal   Peds   Hematology negative hematology ROS (+)   Anesthesia Other Findings   Reproductive/Obstetrics                           Anesthesia Physical Anesthesia Plan  ASA: III  Anesthesia Plan: General   Post-op Pain Management:  [facility] for Post-op pain   Induction: Intravenous  PONV Risk Score and Plan: 2 and Ondansetron and Dexamethasone  Airway Management Planned: Oral ETT  Additional Equipment:   Intra-op Plan:   Post-operative Plan: Post-operative intubation/ventilation  Informed Consent: I have reviewed the patients History and Physical, chart, labs and discussed the procedure including the risks, benefits and alternatives for the proposed anesthesia with the patient or authorized representative who has indicated his/her understanding and acceptance.     Dental advisory given  Plan Discussed with: CRNA and Anesthesiologist  Anesthesia Plan Comments: (See PAT note 09/12/2019, Konrad Felix, PA-C)      Anesthesia Quick Evaluation

## 2019-09-15 NOTE — Progress Notes (Signed)
Anesthesia Chart Review   Case: [id number] Date/Time: 09/18/19 0945   Procedure: REVERSE SHOULDER ARTHROPLASTY (Right Shoulder) - 19min   Anesthesia type: General   Pre-op diagnosis: Right shoulder rotator cuff tear arthropathy   Location: [facility] ROOM 10 / [facility]   Surgeons: Justice Britain, MD      DISCUSSION:59 y.o. former smoker (25 pack years, quit 01/02/98) with h/o HTN, PAF (on Eliquis), right shoulder rotator cuff tear scheduled for above procedure 09/18/2019 with Dr. Justice Britain.    Clearance received from PCP, on chart.    Per cardiology note 05/27/2019, "Patient doing well with Multaq with almost no further afib. No DCCV or ablation planned. Would be OK to hold anticoagulation from an afib standpoint. Will forward to Preop pool."  Last seen in A-fib clinic 08/28/2019.  Per OV note pt doing well.  Prior to this visit pt complained of increased episodes of palpitations, Zio monitor ordered with no a-fib.  Episodes associated with PVCs.  PRN diltiazem with improvement.  At 08/28/19 visit Diltiazem 120 mg started daily.    Anticipate pt can proceed with planned procedure barring acute status change.   VS: BP (!) 142/89   Pulse 80   Temp 36.8 C (Oral)   Resp 18   Ht 6' (1.829 m)   Wt (!) 147.4 kg   SpO2 96%   BMI 44.08 kg/m   PROVIDERS: Silverio Decamp, MD is PCP   Daneen Schick, MD is Cardiologist  LABS: Labs reviewed: Acceptable for surgery. (all labs ordered are listed, but only abnormal results are displayed)  Labs Reviewed  SURGICAL PCR SCREEN  BASIC METABOLIC PANEL  CBC     IMAGES:   EKG: 08/28/2019 Rate 63 bpm Normal sinus rhythm Right superior axis deviation Incomplete right bundle branch block Possible Right ventricular hypertrophy No significant change since last tracing  CV: Echo 09/30/2018 IMPRESSIONS    1. Left ventricular ejection fraction, by visual estimation, is 65 to  70%. The left ventricle has normal function. Normal left ventricular  size.  There is mildly increased left ventricular hypertrophy.  2. Definity contrast agent was given IV to delineate the left ventricular  endocardial borders.  3. Left ventricular diastolic Doppler parameters are indeterminate  pattern of LV diastolic filling.  4. Global right ventricle has normal systolic function.The right  ventricular size is normal. No increase in right ventricular wall  thickness.  5. The left ventricular function has improved from previous echo  Cardiac Cath 06/21/2018  Dist LAD lesion is 75% stenosed. 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  There is mild left ventricular systolic dysfunction.  The left ventricular ejection fraction is 35-45% by visual estimate.  There is no aortic valve stenosis.  Past Medical History:  Diagnosis Date  . Arrhythmia   . Arthritis   . Asthma    as child  . Avascular necrosis of bone of left hip ([place])   . Back pain   . Blood clot in vein 06/2016   x 2 no bleeding disorders found after back surgery  . Complication of anesthesia    ketamine hallucinations  . Coronary artery disease   . Dislocated hip, left, initial encounter ([place]) 10/03/2018  . Dysrhythmia 2020   a-fib  . Hypertension   . Neuromuscular disorder ([place])    neuropathy bi lat legs knee down  . PAF (paroxysmal atrial fibrillation) ([place])   . Viral pericarditis 20 yrs ago    Past Surgical History:  Procedure Laterality Date  .  ABDOMINAL EXPOSURE N/A 06/23/2016   Procedure: ABDOMINAL EXPOSURE;  Surgeon: Rosetta Posner, MD;  Location: [facility];  Service: Vascular;  Laterality: N/A;  . ANTERIOR LATERAL LUMBAR FUSION 4 LEVELS Right 06/23/2016   Procedure: Right  Lumbar two-three Lumbar three-four Lumbar four-five Anterolateral lumbar interbody fusion;  Surgeon: Erline Levine, MD;  Location: [facility];  Service: Neurosurgery;  Laterality: Right;  . ANTERIOR LUMBAR FUSION N/A 06/23/2016   Procedure: Lumbar five -sacral one  Anterior lumbar  interbody fusion with Dr. Sherren Mocha Early for approach;  Surgeon: Erline Levine, MD;  Location: [facility];  Service: Neurosurgery;  Laterality: N/A;  . APPLICATION OF INTRAOPERATIVE CT SCAN N/A 06/26/2016   Procedure: APPLICATION OF INTRAOPERATIVE CT SCAN;  Surgeon: Erline Levine, MD;  Location: [facility];  Service: Neurosurgery;  Laterality: N/A;  . BACK SURGERY    . CARDIAC CATHETERIZATION    . CARDIOVASCULAR STRESS TEST     Negative in May 2014  . COLONOSCOPY    . HERNIA REPAIR     umbilical  . HIP CLOSED REDUCTION Left 10/03/2018   Procedure: CLOSED REDUCTION HIP;  Surgeon: Rod Can, MD;  [facility];  Service: Orthopedics;  Laterality: Left;  . IR IVC FILTER PLMT / S&I Burke Keels GUID/MOD SED  03/26/2018  . IR RADIOLOGIST EVAL & MGMT  04/16/2019  . JOINT REPLACEMENT    . LEFT HEART CATH AND CORONARY ANGIOGRAPHY N/A 06/21/2018   Procedure: LEFT HEART CATH AND CORONARY ANGIOGRAPHY;  Surgeon: Jettie Booze, MD;  Location: [facility];  Service: Cardiovascular;  Laterality: N/A;  . POSTERIOR LUMBAR FUSION 4 LEVEL N/A 06/26/2016   Procedure: Thoracic ten to Pelvis fixation with AIRO;  Surgeon: Erline Levine, MD;  Location: [facility];  Service: Neurosurgery;  Laterality: N/A;  . TENDON REPAIR     right hand  . TOTAL HIP ARTHROPLASTY Right 05/25/2014   Procedure: RIGHT TOTAL HIP ARTHROPLASTY ANTERIOR APPROACH;  Surgeon: Rod Can, MD;  Location: [facility];  Service: Orthopedics;  Laterality: Right;  . TOTAL HIP ARTHROPLASTY Left 03/27/2018   Procedure: TOTAL HIP ARTHROPLASTY ANTERIOR APPROACH- DA complex;  Surgeon: Rod Can, MD;  [facility];  Service: Orthopedics;  Laterality: Left;  . TOTAL KNEE ARTHROPLASTY Bilateral     MEDICATIONS: . acetaminophen (TYLENOL) 325 MG tablet  . Ascorbic Acid (VITAMIN C) 500 MG CAPS  . atorvastatin (LIPITOR) 80 MG tablet  . cyclobenzaprine (FLEXERIL) 10 MG tablet  . diltiazem (CARDIZEM CD) 120 MG 24 hr capsule  . diltiazem (CARDIZEM) 30 MG  tablet  . docusate sodium (COLACE) 100 MG capsule  . DULoxetine (CYMBALTA) 60 MG capsule  . ELIQUIS 5 MG TABS tablet  . lisinopril-hydrochlorothiazide (ZESTORETIC) 20-25 MG tablet  . metoprolol succinate (TOPROL-XL) 100 MG 24 hr tablet  . MULTAQ 400 MG tablet  . Multiple Vitamin (DAILY VITAMIN PO)  . nitroGLYCERIN (NITROSTAT) 0.4 MG SL tablet  . polyethylene glycol (MIRALAX / GLYCOLAX) 17 g packet  . terbinafine (LAMISIL) 250 MG tablet  . zolpidem (AMBIEN) 10 MG tablet   No current facility-administered medications for this encounter.    Konrad Felix, PA-C [facility] [phone]

## 2019-09-16 NOTE — Progress Notes (Signed)
Cardiology Office Note:    Date:  09/17/2019   ID:  Travis Huff, DOB 25-Mar-1960, MRN [id number]  PCP:  Travis Decamp, MD  Cardiologist:  Travis Grooms, MD   Referring MD: Travis Huff   Chief Complaint  Patient presents with  . Atrial Fibrillation  . Hypertension  . Hyperlipidemia    History of Present Illness:    Travis Huff is a 59 y.o. male with a hx of anxiety, HTN, obesity, multiple PE's (on apixaban and s/p IVC filter prior to orthopedic surgery--> filter to be removed),NSTEMI, probable CA dissection, PAF on Multaq, and chronic combined diastolic and systolic HF due to ischemic cardiomyopathy (EF 40-45%)-> IMPROVED TO NORMAL 2021  Having shoulder surgery tomorrow by Travis Huff.  Feels tired frequently.  Excessive daytime sleepiness.  Has a history of atrial fibrillation.  He snores occasionally.  Paroxysmal atrial fibrillation has been noted.  He is followed in the A. fib clinic sleep apnea has not been excluded as a possible etiology for atrial fibrillation.  States that Travis Huff of the radiology department has recommended that he not have the IVC filter taken out.  Past Medical History:  Diagnosis Date  . Arrhythmia   . Arthritis   . Asthma    as child  . Avascular necrosis of bone of left hip ([place])   . Back pain   . Blood clot in vein 06/2016   x 2 no bleeding disorders found after back surgery  . Complication of anesthesia    ketamine hallucinations  . Coronary artery disease   . Dislocated hip, left, initial encounter ([place]) 10/03/2018  . Dysrhythmia 2020   a-fib  . Hypertension   . Neuromuscular disorder ([place])    neuropathy bi lat legs knee down  . PAF (paroxysmal atrial fibrillation) ([place])   . Viral pericarditis 20 yrs ago    Past Surgical History:  Procedure Laterality Date  . ABDOMINAL EXPOSURE N/A 06/23/2016   Procedure: ABDOMINAL EXPOSURE;  Surgeon: Travis Posner, MD;  Location: [facility];  Service: Vascular;   Laterality: N/A;  . ANTERIOR LATERAL LUMBAR FUSION 4 LEVELS Right 06/23/2016   Procedure: Right  Lumbar two-three Lumbar three-four Lumbar four-five Anterolateral lumbar interbody fusion;  Surgeon: Travis Levine, MD;  Location: [facility];  Service: Neurosurgery;  Laterality: Right;  . ANTERIOR LUMBAR FUSION N/A 06/23/2016   Procedure: Lumbar five -sacral one  Anterior lumbar interbody fusion with Dr. Sherren Huff Early for approach;  Surgeon: Travis Levine, MD;  Location: [facility];  Service: Neurosurgery;  Laterality: N/A;  . APPLICATION OF INTRAOPERATIVE CT SCAN N/A 06/26/2016   Procedure: APPLICATION OF INTRAOPERATIVE CT SCAN;  Surgeon: Travis Levine, MD;  Location: [facility];  Service: Neurosurgery;  Laterality: N/A;  . BACK SURGERY    . CARDIAC CATHETERIZATION    . CARDIOVASCULAR STRESS TEST     Negative in May 2014  . COLONOSCOPY    . HERNIA REPAIR     umbilical  . HIP CLOSED REDUCTION Left 10/03/2018   Procedure: CLOSED REDUCTION HIP;  Surgeon: Travis Can, MD;  [facility];  Service: Orthopedics;  Laterality: Left;  . IR IVC FILTER PLMT / S&I Burke Keels GUID/MOD SED  03/26/2018  . IR RADIOLOGIST EVAL & MGMT  04/16/2019  . JOINT REPLACEMENT    . LEFT HEART CATH AND CORONARY ANGIOGRAPHY N/A 06/21/2018   Procedure: LEFT HEART CATH AND CORONARY ANGIOGRAPHY;  Surgeon: Travis Booze, MD;  Location: [facility];  Service:  Cardiovascular;  Laterality: N/A;  . POSTERIOR LUMBAR FUSION 4 LEVEL N/A 06/26/2016   Procedure: Thoracic ten to Pelvis fixation with AIRO;  Surgeon: Travis Levine, MD;  Location: [facility];  Service: Neurosurgery;  Laterality: N/A;  . TENDON REPAIR     right hand  . TOTAL HIP ARTHROPLASTY Right 05/25/2014   Procedure: RIGHT TOTAL HIP ARTHROPLASTY ANTERIOR APPROACH;  Surgeon: Travis Can, MD;  Location: [facility];  Service: Orthopedics;  Laterality: Right;  . TOTAL HIP ARTHROPLASTY Left 03/27/2018   Procedure: TOTAL HIP ARTHROPLASTY ANTERIOR APPROACH- DA complex;  Surgeon: Travis Can, MD;  [facility];  Service: Orthopedics;  Laterality: Left;  . TOTAL KNEE ARTHROPLASTY Bilateral     Current Medications: Current Meds  Medication Sig  . acetaminophen (TYLENOL) 325 MG tablet Take 650 mg by mouth every 6 (six) hours as needed for mild pain or headache.  [name] apixaban (ELIQUIS) 5 MG TABS tablet Take 5 mg by mouth 2 (two) times daily.  . Ascorbic Acid (VITAMIN C) 500 MG CAPS Take 1 capsule by mouth daily.   [name] atorvastatin (LIPITOR) 80 MG tablet Take 1 tablet (80 mg total) by mouth daily at 6 PM.  . cyclobenzaprine (FLEXERIL) 10 MG tablet Take 40 mg by mouth at bedtime.  [name] diltiazem (CARDIZEM CD) 120 MG 24 hr capsule Take 1 capsule (120 mg total) by mouth daily.  [name] docusate sodium (COLACE) 100 MG capsule Take 1 capsule (100 mg total) by mouth 2 (two) times daily.  . DULoxetine (CYMBALTA) 60 MG capsule TAKE 1 CAPSULE (60 MG TOTAL) BY MOUTH AT BEDTIME.  [name] lisinopril-hydrochlorothiazide (ZESTORETIC) 20-25 MG tablet Take 1 tablet by mouth daily.  . metoprolol succinate (TOPROL-XL) 100 MG 24 hr tablet Take 2 tablets (200 mg total) by mouth daily. Take 2 tablets by mouth in the morning  . MULTAQ 400 MG tablet TAKE 1 TABLET (400 MG TOTAL) BY MOUTH 2 (TWO) TIMES DAILY WITH A MEAL.  . Multiple Vitamin (DAILY VITAMIN PO) Taking one tablet by mouth daily- Calcium, magnesium and zince tablet   . nitroGLYCERIN (NITROSTAT) 0.4 MG SL tablet Place 1 tablet (0.4 mg total) under the tongue every 5 (five) minutes x 3 doses as needed for chest pain. If taking third dose, call 911.  . polyethylene glycol (MIRALAX / GLYCOLAX) 17 g packet Take 17 g by mouth every other day.   . terbinafine (LAMISIL) 250 MG tablet TAKE 1 TABLET (250 MG TOTAL) BY MOUTH DAILY.  [name] zolpidem (AMBIEN) 10 MG tablet TAKE 1 TABLET BY MOUTH EVERY NIGHT AT BEDTIME AS NEEDED FOR SLEEP     Allergies:   Ketamine and Morphine and related   Social History   Socioeconomic History  . Marital status: Married    Spouse  name: Travis Huff  . Number of children: 2  . Years of education: 41  . Highest education level: Not on file  Occupational History  . Occupation: Public relations account executive  Tobacco Use  . Smoking status: Former Smoker    Packs/day: 1.00    Years: 25.00    Pack years: 25.00    Types: Cigarettes    Quit date: 2000    Years since quitting: 21.7  . Smokeless tobacco: Current User    Types: Snuff  Vaping Use  . Vaping Use: Never used  Substance and Sexual Activity  . Alcohol use: Yes    Alcohol/week: 14.0 standard drinks    Types: 14 Cans of beer per week    Comment: 2 beers  QD  . Drug use: No  . Sexual activity: Not on file  Other Topics Concern  . Not on file  Social History Narrative   Lives with wife in a one story home.  Has 2 children.  Works as a Public relations account executive.  Education: high school.    Social Determinants of Health   Financial Resource Strain:   . Difficulty of Paying Living Expenses: Not on file  Food Insecurity:   . Worried About Charity fundraiser in the Last Year: Not on file  . Ran Out of Food in the Last Year: Not on file  Transportation Needs:   . Lack of Transportation (Medical): Not on file  . Lack of Transportation (Non-Medical): Not on file  Physical Activity:   . Days of Exercise per Week: Not on file  . Minutes of Exercise per Session: Not on file  Stress:   . Feeling of Stress : Not on file  Social Connections:   . Frequency of Communication with Friends and Family: Not on file  . Frequency of Social Gatherings with Friends and Family: Not on file  . Attends Religious Services: Not on file  . Active Member of Clubs or Organizations: Not on file  . Attends Archivist Meetings: Not on file  . Marital Status: Not on file     Family History: The patient's family history includes Healthy in his daughter and daughter. He was adopted.  ROS:   Please see the history of present illness.    Neuropathy makes walking difficult.  He has shoulder and hip  pain.  He has palpitations that are frequently felt.  He is followed in A. fib clinic.  No documented recurrences on Multaq.  All other systems reviewed and are negative.  EKGs/Labs/Other Studies Reviewed:    The following studies were reviewed today:  [place] 2020: IMPRESSIONS    1. Left ventricular ejection fraction, by visual estimation, is 65 to  70%. The left ventricle has normal function. Normal left ventricular size.  There is mildly increased left ventricular hypertrophy.  2. Definity contrast agent was given IV to delineate the left ventricular  endocardial borders.  3. Left ventricular diastolic Doppler parameters are indeterminate  pattern of LV diastolic filling.  4. Global right ventricle has normal systolic function.The right  ventricular size is normal. No increase in right ventricular wall  thickness.  5. The left ventricular function has improved from previous echo   DVT STUDY 02/24/2019 Summary:  Right: No evidence of common femoral vein obstruction.  Left: Unable to visualize peroneal and posterior tibial veins due to body  habitus and increased swelling.  All other veins visualized appear fully compressible and demonstrate  Doppler characteristics.   EKG:  EKG sinus rhythm with indeterminate axis, RSR prime in V1.  Low voltage.  Recent Labs: 11/20/2018: ALT 30 09/12/2019: BUN 17; Creatinine, Ser 0.97; Hemoglobin 16.2; Platelets 200; Potassium 4.2; Sodium 139  Recent Lipid Panel    Component Value Date/Time   CHOL 148 11/20/2018 1147   TRIG 111 11/20/2018 1147   HDL 53 11/20/2018 1147   CHOLHDL 2.8 11/20/2018 1147   VLDL 15 05/23/2012 0610   LDLCALC 76 11/20/2018 1147    Physical Exam:    VS:  BP (!) 106/58   Pulse 76   Ht 6' (1.829 m)   Wt (!) 328 lb (148.8 kg)   BMI 44.48 kg/m     Wt Readings from Last 3 Encounters:  09/17/19 (!) 328 lb (  148.8 kg)  09/12/19 (!) 325 lb (147.4 kg)  08/28/19 (!) 324 lb 12.8 oz (147.3 kg)     GEN:  Morbidly obese. No acute distress HEENT: Normal NECK: No JVD. LYMPHATICS: No lymphadenopathy CARDIAC:  RRR without murmur, gallop, or edema. VASCULAR:  Normal Pulses. No bruits. RESPIRATORY:  Clear to auscultation without rales, wheezing or rhonchi  ABDOMEN: Soft, non-tender, non-distended, No pulsatile mass, MUSCULOSKELETAL: No deformity  SKIN: Warm and dry NEUROLOGIC:  Alert and oriented x 3 PSYCHIATRIC:  Normal affect   ASSESSMENT:    1. NSTEMI (non-ST elevated myocardial infarction) (HCC)   2. Paroxysmal atrial fibrillation (HCC)   3. Coronary artery disease involving native coronary artery of native heart without angina pectoris   4. Hyperlipidemia, unspecified hyperlipidemia type   5. Secondary hypercoagulable state ([place])   6. Chronic anticoagulation   7. Educated about COVID-19 virus infection   8. Snoring   9. Excessive daytime sleepiness    PLAN:    In order of problems listed above:  1. Etiology never totally resolved.  Possibly had stress cardiomyopathy versus CAD. 2. Controlled on Multaq. 3. Secondary prevention has been discussed. 4. Continue statin therapy. 5. He is on apixaban.  2 prior episodes of DVT.  Now with atrial fibrillation there is a secondary hypercoagulable state that is being treated with apixaban that should be continued indefinitely. 6. Apixaban therapy is on hold for shoulder surgery tomorrow. 7. COVID-19 vaccine is been received. 8. Needs sleep study to define if he has sleep apnea and to evaluate as a possible contributor to atrial fibrillation. 9. Acute systolic heart failure has resolved to normal LV function.   Medication Adjustments/Labs and Tests Ordered: Current medicines are reviewed at length with the patient today.  Concerns regarding medicines are outlined above.  Orders Placed This Encounter  Procedures  . Basic metabolic panel  . CBC  . EKG 12-Lead  . Split night study   No orders of the defined types were placed in this  encounter.   Patient Instructions  Medication Instructions:  Your physician recommends that you continue on your current medications as directed. Please refer to the Current Medication list given to you today.  *If you need a refill on your cardiac medications before your next appointment, please call your pharmacy*   Lab Work: CBC and BMET in March  If you have labs (blood work) drawn today and your tests are completely normal, you will receive your results only by: [name] MyChart Message (if you have MyChart) OR . A paper copy in the mail If you have any lab test that is abnormal or we need to change your treatment, we will call you to review the results.   Testing/Procedures: Your physician has recommended that you have a sleep study. This test records several body functions during sleep, including: brain activity, eye movement, oxygen and carbon dioxide blood levels, heart rate and rhythm, breathing rate and rhythm, the flow of air through your mouth and nose, snoring, body muscle movements, and chest and belly movement.    Follow-Up: At [facility], you and your health needs are our priority.  As part of our continuing mission to provide you with exceptional heart care, we have created designated Provider Care Teams.  These Care Teams include your primary Cardiologist (physician) and Advanced Practice Providers (APPs -  Physician Assistants and Nurse Practitioners) who all work together to provide you with the care you need, when you need it.  We recommend signing up for  the patient portal called "MyChart".  Sign up information is provided on this After Visit Summary.  MyChart is used to connect with patients for Virtual Visits (Telemedicine).  Patients are able to view lab/test results, encounter notes, upcoming appointments, etc.  Non-urgent messages Huff be sent to your provider as well.   To learn more about what you can do with MyChart, go to [email or web address].    Your next  appointment:   9-12 month(s)  The format for your next appointment:   In Person  Provider:   You may see Travis Grooms, MD or one of the following Advanced Practice Providers on your designated Care Team:    Truitt Merle, NP  Cecilie Kicks, NP  Kathyrn Drown, NP    Other Instructions      Signed, Travis Grooms, MD  09/17/2019 2:55 PM    [place]

## 2019-09-17 ENCOUNTER — Encounter: Payer: Self-pay | Admitting: Interventional Cardiology

## 2019-09-17 ENCOUNTER — Ambulatory Visit: Payer: 59 | Admitting: Interventional Cardiology

## 2019-09-17 ENCOUNTER — Other Ambulatory Visit: Payer: Self-pay

## 2019-09-17 VITALS — BP 106/58 | HR 76 | Ht 72.0 in | Wt 328.0 lb

## 2019-09-17 DIAGNOSIS — R0683 Snoring: Secondary | ICD-10-CM

## 2019-09-17 DIAGNOSIS — E785 Hyperlipidemia, unspecified: Secondary | ICD-10-CM | POA: Diagnosis not present

## 2019-09-17 DIAGNOSIS — Z7901 Long term (current) use of anticoagulants: Secondary | ICD-10-CM

## 2019-09-17 DIAGNOSIS — I2510 Atherosclerotic heart disease of native coronary artery without angina pectoris: Secondary | ICD-10-CM | POA: Diagnosis not present

## 2019-09-17 DIAGNOSIS — G4719 Other hypersomnia: Secondary | ICD-10-CM | POA: Diagnosis not present

## 2019-09-17 DIAGNOSIS — I480 Paroxysmal atrial fibrillation: Secondary | ICD-10-CM

## 2019-09-17 DIAGNOSIS — D6869 Other thrombophilia: Secondary | ICD-10-CM | POA: Diagnosis not present

## 2019-09-17 DIAGNOSIS — I214 Non-ST elevation (NSTEMI) myocardial infarction: Secondary | ICD-10-CM

## 2019-09-17 DIAGNOSIS — Z7189 Other specified counseling: Secondary | ICD-10-CM

## 2019-09-17 MED ORDER — DEXTROSE 5 % IV SOLN
3.0000 g | INTRAVENOUS | Status: AC
  Administered 2019-09-18: 3 g via INTRAVENOUS
  Filled 2019-09-17: qty 3

## 2019-09-17 NOTE — Patient Instructions (Signed)
Medication Instructions:  Your physician recommends that you continue on your current medications as directed. Please refer to the Current Medication list given to you today.  *If you need a refill on your cardiac medications before your next appointment, please call your pharmacy*   Lab Work: CBC and BMET in March  If you have labs (blood work) drawn today and your tests are completely normal, you will receive your results only by: [name] MyChart Message (if you have MyChart) OR . A paper copy in the mail If you have any lab test that is abnormal or we need to change your treatment, we will call you to review the results.   Testing/Procedures: Your physician has recommended that you have a sleep study. This test records several body functions during sleep, including: brain activity, eye movement, oxygen and carbon dioxide blood levels, heart rate and rhythm, breathing rate and rhythm, the flow of air through your mouth and nose, snoring, body muscle movements, and chest and belly movement.    Follow-Up: At [facility], you and your health needs are our priority.  As part of our continuing mission to provide you with exceptional heart care, we have created designated Provider Care Teams.  These Care Teams include your primary Cardiologist (physician) and Advanced Practice Providers (APPs -  Physician Assistants and Nurse Practitioners) who all work together to provide you with the care you need, when you need it.  We recommend signing up for the patient portal called "MyChart".  Sign up information is provided on this After Visit Summary.  MyChart is used to connect with patients for Virtual Visits (Telemedicine).  Patients are able to view lab/test results, encounter notes, upcoming appointments, etc.  Non-urgent messages can be sent to your provider as well.   To learn more about what you can do with MyChart, go to [email or web address].    Your next appointment:   9-12 month(s)  The  format for your next appointment:   In Person  Provider:   You may see Sinclair Grooms, MD or one of the following Advanced Practice Providers on your designated Care Team:    Truitt Merle, NP  Cecilie Kicks, NP  Kathyrn Drown, NP    Other Instructions

## 2019-09-18 ENCOUNTER — Ambulatory Visit (HOSPITAL_COMMUNITY): Payer: 59 | Admitting: Physician Assistant

## 2019-09-18 ENCOUNTER — Encounter (HOSPITAL_COMMUNITY): Payer: Self-pay | Admitting: Orthopedic Surgery

## 2019-09-18 ENCOUNTER — Telehealth: Payer: Self-pay | Admitting: *Deleted

## 2019-09-18 ENCOUNTER — Ambulatory Visit (HOSPITAL_COMMUNITY)
Admission: RE | Admit: 2019-09-18 | Discharge: 2019-09-18 | Disposition: A | Discharge status: Home / Self Care | Payer: 59 | Attending: Orthopedic Surgery | Admitting: Orthopedic Surgery

## 2019-09-18 ENCOUNTER — Ambulatory Visit (HOSPITAL_COMMUNITY): Payer: 59 | Admitting: Certified Registered"

## 2019-09-18 ENCOUNTER — Encounter (HOSPITAL_COMMUNITY)
Admission: RE | Disposition: A | Discharge status: Home / Self Care | Payer: Self-pay | Source: Home / Self Care | Attending: Orthopedic Surgery

## 2019-09-18 DIAGNOSIS — M12811 Other specific arthropathies, not elsewhere classified, right shoulder: Secondary | ICD-10-CM | POA: Diagnosis not present

## 2019-09-18 DIAGNOSIS — I214 Non-ST elevation (NSTEMI) myocardial infarction: Secondary | ICD-10-CM | POA: Diagnosis not present

## 2019-09-18 DIAGNOSIS — I2510 Atherosclerotic heart disease of native coronary artery without angina pectoris: Secondary | ICD-10-CM | POA: Diagnosis not present

## 2019-09-18 DIAGNOSIS — I10 Essential (primary) hypertension: Secondary | ICD-10-CM | POA: Diagnosis not present

## 2019-09-18 DIAGNOSIS — Z79899 Other long term (current) drug therapy: Secondary | ICD-10-CM | POA: Diagnosis not present

## 2019-09-18 DIAGNOSIS — Z7901 Long term (current) use of anticoagulants: Secondary | ICD-10-CM | POA: Diagnosis not present

## 2019-09-18 DIAGNOSIS — M75101 Unspecified rotator cuff tear or rupture of right shoulder, not specified as traumatic: Secondary | ICD-10-CM | POA: Insufficient documentation

## 2019-09-18 DIAGNOSIS — Z87891 Personal history of nicotine dependence: Secondary | ICD-10-CM | POA: Insufficient documentation

## 2019-09-18 DIAGNOSIS — Z981 Arthrodesis status: Secondary | ICD-10-CM | POA: Insufficient documentation

## 2019-09-18 DIAGNOSIS — F419 Anxiety disorder, unspecified: Secondary | ICD-10-CM | POA: Diagnosis not present

## 2019-09-18 DIAGNOSIS — I480 Paroxysmal atrial fibrillation: Secondary | ICD-10-CM | POA: Diagnosis not present

## 2019-09-18 DIAGNOSIS — Z86711 Personal history of pulmonary embolism: Secondary | ICD-10-CM | POA: Insufficient documentation

## 2019-09-18 DIAGNOSIS — G8918 Other acute postprocedural pain: Secondary | ICD-10-CM | POA: Diagnosis not present

## 2019-09-18 DIAGNOSIS — M19011 Primary osteoarthritis, right shoulder: Secondary | ICD-10-CM | POA: Diagnosis not present

## 2019-09-18 DIAGNOSIS — G4719 Other hypersomnia: Secondary | ICD-10-CM

## 2019-09-18 HISTORY — PX: REVERSE SHOULDER ARTHROPLASTY: SHX5054

## 2019-09-18 SURGERY — ARTHROPLASTY, SHOULDER, TOTAL, REVERSE
Anesthesia: General | Site: Shoulder | Laterality: Right

## 2019-09-18 MED ORDER — ONDANSETRON HCL 4 MG/2ML IJ SOLN
INTRAMUSCULAR | Status: DC | PRN
  Administered 2019-09-18: 4 mg via INTRAVENOUS

## 2019-09-18 MED ORDER — FENTANYL CITRATE (PF) 100 MCG/2ML IJ SOLN
INTRAMUSCULAR | Status: DC | PRN
Start: 2019-09-18 — End: 2019-09-18
  Administered 2019-09-18 (×2): 50 ug via INTRAVENOUS

## 2019-09-18 MED ORDER — ALBUMIN HUMAN 5 % IV SOLN: INTRAVENOUS | Status: DC | PRN

## 2019-09-18 MED ORDER — SUCCINYLCHOLINE CHLORIDE 200 MG/10ML IV SOSY
PREFILLED_SYRINGE | INTRAVENOUS | Status: DC | PRN
  Administered 2019-09-18: 140 mg via INTRAVENOUS

## 2019-09-18 MED ORDER — PROPOFOL 10 MG/ML IV BOLUS
INTRAVENOUS | Status: AC
  Filled 2019-09-18: qty 20

## 2019-09-18 MED ORDER — PHENYLEPHRINE HCL (PRESSORS) 10 MG/ML IV SOLN
INTRAVENOUS | Status: AC
  Filled 2019-09-18: qty 1

## 2019-09-18 MED ORDER — BUPIVACAINE LIPOSOME 1.3 % IJ SUSP
INTRAMUSCULAR | Status: DC | PRN
  Administered 2019-09-18: 10 mL via PERINEURAL

## 2019-09-18 MED ORDER — OXYCODONE-ACETAMINOPHEN 5-325 MG PO TABS: 1.0000 | ORAL_TABLET | ORAL | 0 refills | Status: DC | PRN

## 2019-09-18 MED ORDER — 0.9 % SODIUM CHLORIDE (POUR BTL) OPTIME
TOPICAL | Status: DC | PRN
  Administered 2019-09-18: 1000 mL

## 2019-09-18 MED ORDER — DEXAMETHASONE SODIUM PHOSPHATE 10 MG/ML IJ SOLN
INTRAMUSCULAR | Status: DC | PRN
  Administered 2019-09-18: 10 mg via INTRAVENOUS

## 2019-09-18 MED ORDER — CELECOXIB 200 MG PO CAPS
200.0000 mg | ORAL_CAPSULE | Freq: Once | ORAL | Status: AC
  Administered 2019-09-18: 200 mg via ORAL
  Filled 2019-09-18: qty 1

## 2019-09-18 MED ORDER — ALBUMIN HUMAN 5 % IV SOLN
INTRAVENOUS | Status: AC
  Filled 2019-09-18: qty 250

## 2019-09-18 MED ORDER — BUPIVACAINE HCL (PF) 0.5 % IJ SOLN
INTRAMUSCULAR | Status: DC | PRN
  Administered 2019-09-18: 15 mL via PERINEURAL

## 2019-09-18 MED ORDER — ORAL CARE MOUTH RINSE: 15.0000 mL | Freq: Once | OROMUCOSAL | Status: AC

## 2019-09-18 MED ORDER — ROCURONIUM BROMIDE 10 MG/ML (PF) SYRINGE
PREFILLED_SYRINGE | INTRAVENOUS | Status: DC | PRN
  Administered 2019-09-18: 10 mg via INTRAVENOUS
  Administered 2019-09-18: 70 mg via INTRAVENOUS

## 2019-09-18 MED ORDER — STERILE WATER FOR IRRIGATION IR SOLN
Status: DC | PRN
  Administered 2019-09-18: 2000 mL

## 2019-09-18 MED ORDER — METOCLOPRAMIDE HCL 5 MG/ML IJ SOLN: 5.0000 mg | Freq: Three times a day (TID) | INTRAMUSCULAR | Status: DC | PRN

## 2019-09-18 MED ORDER — MIDAZOLAM HCL 2 MG/2ML IJ SOLN
1.0000 mg | INTRAMUSCULAR | Status: DC
  Administered 2019-09-18: 2 mg via INTRAVENOUS
  Filled 2019-09-18: qty 2

## 2019-09-18 MED ORDER — EPHEDRINE SULFATE-NACL 50-0.9 MG/10ML-% IV SOSY
PREFILLED_SYRINGE | INTRAVENOUS | Status: DC | PRN
  Administered 2019-09-18 (×7): 10 mg via INTRAVENOUS

## 2019-09-18 MED ORDER — ONDANSETRON HCL 4 MG PO TABS: 4.0000 mg | ORAL_TABLET | Freq: Three times a day (TID) | ORAL | 0 refills | Status: DC | PRN

## 2019-09-18 MED ORDER — FENTANYL CITRATE (PF) 100 MCG/2ML IJ SOLN
50.0000 ug | INTRAMUSCULAR | Status: DC
  Administered 2019-09-18: 100 ug via INTRAVENOUS
  Filled 2019-09-18: qty 2

## 2019-09-18 MED ORDER — DEXAMETHASONE SODIUM PHOSPHATE 10 MG/ML IJ SOLN
INTRAMUSCULAR | Status: AC
  Filled 2019-09-18: qty 1

## 2019-09-18 MED ORDER — CHLORHEXIDINE GLUCONATE 0.12 % MT SOLN
15.0000 mL | Freq: Once | OROMUCOSAL | Status: AC
  Administered 2019-09-18: 15 mL via OROMUCOSAL

## 2019-09-18 MED ORDER — ROCURONIUM BROMIDE 10 MG/ML (PF) SYRINGE
PREFILLED_SYRINGE | INTRAVENOUS | Status: AC
  Filled 2019-09-18: qty 10

## 2019-09-18 MED ORDER — ALBUTEROL SULFATE HFA 108 (90 BASE) MCG/ACT IN AERS
INHALATION_SPRAY | RESPIRATORY_TRACT | Status: AC
  Filled 2019-09-18: qty 6.7

## 2019-09-18 MED ORDER — METOCLOPRAMIDE HCL 5 MG PO TABS
5.0000 mg | ORAL_TABLET | Freq: Three times a day (TID) | ORAL | Status: DC | PRN
  Filled 2019-09-18: qty 2

## 2019-09-18 MED ORDER — BUPIVACAINE HCL (PF) 0.5 % IJ SOLN: INTRAMUSCULAR | Status: DC | PRN

## 2019-09-18 MED ORDER — FENTANYL CITRATE (PF) 100 MCG/2ML IJ SOLN
INTRAMUSCULAR | Status: AC
  Filled 2019-09-18: qty 2

## 2019-09-18 MED ORDER — FENTANYL CITRATE (PF) 100 MCG/2ML IJ SOLN: 25.0000 ug | INTRAMUSCULAR | Status: DC | PRN

## 2019-09-18 MED ORDER — MIDAZOLAM HCL 2 MG/2ML IJ SOLN
INTRAMUSCULAR | Status: AC
  Filled 2019-09-18: qty 2

## 2019-09-18 MED ORDER — ONDANSETRON HCL 4 MG/2ML IJ SOLN
INTRAMUSCULAR | Status: AC
  Filled 2019-09-18: qty 2

## 2019-09-18 MED ORDER — PHENYLEPHRINE 40 MCG/ML (10ML) SYRINGE FOR IV PUSH (FOR BLOOD PRESSURE SUPPORT): PREFILLED_SYRINGE | INTRAVENOUS | Status: DC | PRN

## 2019-09-18 MED ORDER — SUGAMMADEX SODIUM 500 MG/5ML IV SOLN
INTRAVENOUS | Status: AC
  Filled 2019-09-18: qty 5

## 2019-09-18 MED ORDER — LACTATED RINGERS IV SOLN: INTRAVENOUS | Status: DC

## 2019-09-18 MED ORDER — CYCLOBENZAPRINE HCL 10 MG PO TABS: 10.0000 mg | ORAL_TABLET | Freq: Three times a day (TID) | ORAL | 1 refills | Status: DC | PRN

## 2019-09-18 MED ORDER — PROPOFOL 10 MG/ML IV BOLUS
INTRAVENOUS | Status: DC | PRN
  Administered 2019-09-18: 200 mg via INTRAVENOUS
  Administered 2019-09-18: 20 mg via INTRAVENOUS

## 2019-09-18 MED ORDER — ACETAMINOPHEN 500 MG PO TABS
1000.0000 mg | ORAL_TABLET | Freq: Once | ORAL | Status: AC
  Administered 2019-09-18: 1000 mg via ORAL
  Filled 2019-09-18: qty 2

## 2019-09-18 MED ORDER — TRANEXAMIC ACID-NACL 1000-0.7 MG/100ML-% IV SOLN
1000.0000 mg | INTRAVENOUS | Status: AC
  Administered 2019-09-18: 1000 mg via INTRAVENOUS
  Filled 2019-09-18: qty 100

## 2019-09-18 MED ORDER — ONDANSETRON HCL 4 MG/2ML IJ SOLN: 4.0000 mg | Freq: Four times a day (QID) | INTRAMUSCULAR | Status: DC | PRN

## 2019-09-18 MED ORDER — SUGAMMADEX SODIUM 200 MG/2ML IV SOLN
INTRAVENOUS | Status: DC | PRN
  Administered 2019-09-18: 400 mg via INTRAVENOUS

## 2019-09-18 MED ORDER — PHENYLEPHRINE 40 MCG/ML (10ML) SYRINGE FOR IV PUSH (FOR BLOOD PRESSURE SUPPORT)
PREFILLED_SYRINGE | INTRAVENOUS | Status: DC | PRN
  Administered 2019-09-18 (×4): 120 ug via INTRAVENOUS
  Administered 2019-09-18: 160 ug via INTRAVENOUS
  Administered 2019-09-18: 120 ug via INTRAVENOUS

## 2019-09-18 MED ORDER — PROMETHAZINE HCL 25 MG/ML IJ SOLN: 6.2500 mg | INTRAMUSCULAR | Status: DC | PRN

## 2019-09-18 MED ORDER — LACTATED RINGERS IV BOLUS
250.0000 mL | Freq: Once | INTRAVENOUS | Status: AC
  Administered 2019-09-18: 250 mL via INTRAVENOUS

## 2019-09-18 MED ORDER — PHENYLEPHRINE HCL-NACL 10-0.9 MG/250ML-% IV SOLN
INTRAVENOUS | Status: DC | PRN
  Administered 2019-09-18: 50 ug/min via INTRAVENOUS

## 2019-09-18 MED ORDER — PHENYLEPHRINE 40 MCG/ML (10ML) SYRINGE FOR IV PUSH (FOR BLOOD PRESSURE SUPPORT)
PREFILLED_SYRINGE | INTRAVENOUS | Status: AC
  Filled 2019-09-18: qty 10

## 2019-09-18 MED ORDER — ONDANSETRON HCL 4 MG PO TABS
4.0000 mg | ORAL_TABLET | Freq: Four times a day (QID) | ORAL | Status: DC | PRN
  Filled 2019-09-18: qty 1

## 2019-09-18 MED ORDER — EPHEDRINE 5 MG/ML INJ
INTRAVENOUS | Status: AC
  Filled 2019-09-18: qty 10

## 2019-09-18 MED ORDER — MIDAZOLAM HCL 5 MG/5ML IJ SOLN
INTRAMUSCULAR | Status: DC | PRN
  Administered 2019-09-18: 1 mg via INTRAVENOUS

## 2019-09-18 MED ORDER — PROPOFOL 10 MG/ML IV BOLUS
INTRAVENOUS | Status: AC
  Filled 2019-09-18: qty 40

## 2019-09-18 MED ORDER — LIDOCAINE 2% (20 MG/ML) 5 ML SYRINGE
INTRAMUSCULAR | Status: AC
  Filled 2019-09-18: qty 5

## 2019-09-18 MED FILL — ONDANSETRON HCL 4 MG TABLET: 4 | 3 days supply | Qty: 10 | Fill #0

## 2019-09-18 MED FILL — CYCLOBENZAPRINE HCL 10 MG T: 10 | 10 days supply | Qty: 30 | Fill #0

## 2019-09-18 MED FILL — OXYCODONE-ACETAMINOPHEN 5-3: 5-325 | 5 days supply | Qty: 30 | Fill #0

## 2019-09-18 SURGICAL SUPPLY — 70 items
ADH SKN CLS APL DERMABOND .7 (GAUZE/BANDAGES/DRESSINGS) ×1
AID PSTN UNV HD RSTRNT DISP (MISCELLANEOUS) ×1
BAG SPEC THK2 15X12 ZIP CLS (MISCELLANEOUS) ×1
BAG ZIPLOCK 12X15 (MISCELLANEOUS) ×2 IMPLANT
BLADE SAW SGTL 83.5X18.5 (BLADE) ×2 IMPLANT
BSPLAT GLND +2X24 MDLR (Joint) ×1 IMPLANT
COOLER ICEMAN CLASSIC (MISCELLANEOUS) IMPLANT
COVER BACK TABLE 60X90IN (DRAPES) ×2 IMPLANT
COVER SURGICAL LIGHT HANDLE (MISCELLANEOUS) ×2 IMPLANT
COVER WAND RF STERILE (DRAPES) IMPLANT
CUP SUT UNIV REVERS 39 NEU (Shoulder) ×2 IMPLANT
DERMABOND ADVANCED (GAUZE/BANDAGES/DRESSINGS) ×1
DERMABOND ADVANCED .7 DNX12 (GAUZE/BANDAGES/DRESSINGS) ×1 IMPLANT
DRAPE INCISE IOBAN 66X45 STRL (DRAPES) IMPLANT
DRAPE ORTHO SPLIT 77X108 STRL (DRAPES) ×4
DRAPE SHEET LG 3/4 BI-LAMINATE (DRAPES) ×2 IMPLANT
DRAPE SURG 17X11 SM STRL (DRAPES) ×2 IMPLANT
DRAPE SURG ORHT 6 SPLT 77X108 (DRAPES) ×2 IMPLANT
DRAPE U-SHAPE 47X51 STRL (DRAPES) ×2 IMPLANT
DRSG AQUACEL AG ADV 3.5X10 (GAUZE/BANDAGES/DRESSINGS) ×2 IMPLANT
DURAPREP 26ML APPLICATOR (WOUND CARE) ×2 IMPLANT
ELECT BLADE TIP CTD 4 INCH (ELECTRODE) ×2 IMPLANT
ELECT REM PT RETURN 15FT ADLT (MISCELLANEOUS) ×2 IMPLANT
FACESHIELD WRAPAROUND (MASK) ×8 IMPLANT
GLENOID UNI REV MOD 24 +2 LAT (Joint) ×2 IMPLANT
GLENOSPHERE 39+4 LAT/24 UNI RV (Joint) ×2 IMPLANT
GLOVE BIO SURGEON STRL SZ7.5 (GLOVE) ×2 IMPLANT
GLOVE BIO SURGEON STRL SZ8 (GLOVE) ×2 IMPLANT
GLOVE SS BIOGEL STRL SZ 7 (GLOVE) ×1 IMPLANT
GLOVE SS BIOGEL STRL SZ 7.5 (GLOVE) ×1 IMPLANT
GLOVE SUPERSENSE BIOGEL SZ 7 (GLOVE) ×1
GLOVE SUPERSENSE BIOGEL SZ 7.5 (GLOVE) ×1
GLOVE SURG SYN 7.0 (GLOVE) IMPLANT
GLOVE SURG SYN 7.5  E (GLOVE)
GLOVE SURG SYN 7.5 E (GLOVE) IMPLANT
GLOVE SURG SYN 8.0 (GLOVE) IMPLANT
GOWN STRL REUS W/TWL LRG LVL3 (GOWN DISPOSABLE) ×4 IMPLANT
INSERT HUMERAL 39/+6 (Insert) ×2 IMPLANT
KIT BASIN OR (CUSTOM PROCEDURE TRAY) ×2 IMPLANT
KIT TURNOVER KIT A (KITS) IMPLANT
MANIFOLD NEPTUNE II (INSTRUMENTS) ×2 IMPLANT
NEEDLE TAPERED W/ NITINOL LOOP (MISCELLANEOUS) ×2 IMPLANT
NS IRRIG 1000ML POUR BTL (IV SOLUTION) ×2 IMPLANT
PACK SHOULDER (CUSTOM PROCEDURE TRAY) ×2 IMPLANT
PAD ARMBOARD 7.5X6 YLW CONV (MISCELLANEOUS) ×2 IMPLANT
PAD COLD SHLDR WRAP-ON (PAD) IMPLANT
PIN NITINOL TARGETER 2.8 (PIN) IMPLANT
PIN SET MODULAR GLENOID SYSTEM (PIN) ×2 IMPLANT
RESTRAINT HEAD UNIVERSAL NS (MISCELLANEOUS) ×2 IMPLANT
SCREW CENTRAL MOD 30MM (Screw) ×2 IMPLANT
SCREW PERI LOCK 5.5X24 (Screw) ×4 IMPLANT
SCREW PERI LOCK 5.5X36 (Screw) ×4 IMPLANT
SLING ARM FOAM STRAP LRG (SOFTGOODS) IMPLANT
SLING ARM FOAM STRAP MED (SOFTGOODS) IMPLANT
SLING ARM FOAM STRAP XLG (SOFTGOODS) ×2 IMPLANT
SPONGE LAP 18X18 RF (DISPOSABLE) IMPLANT
STEM HUMERAL UNIVERS SZ8 (Stem) ×2 IMPLANT
SUCTION FRAZIER HANDLE 12FR (TUBING) ×2
SUCTION TUBE FRAZIER 12FR DISP (TUBING) ×1 IMPLANT
SUT FIBERWIRE #2 38 T-5 BLUE (SUTURE)
SUT MNCRL AB 3-0 PS2 18 (SUTURE) ×2 IMPLANT
SUT MON AB 2-0 CT1 36 (SUTURE) ×2 IMPLANT
SUT VIC AB 1 CT1 36 (SUTURE) ×2 IMPLANT
SUTURE FIBERWR #2 38 T-5 BLUE (SUTURE) IMPLANT
SUTURE TAPE 1.3 40 TPR END (SUTURE) ×2 IMPLANT
SUTURETAPE 1.3 40 TPR END (SUTURE) ×4
TOWEL OR 17X26 10 PK STRL BLUE (TOWEL DISPOSABLE) ×2 IMPLANT
TOWEL OR NON WOVEN STRL DISP B (DISPOSABLE) ×2 IMPLANT
WATER STERILE IRR 1000ML POUR (IV SOLUTION) ×4 IMPLANT
YANKAUER SUCT BULB TIP 10FT TU (MISCELLANEOUS) ×2 IMPLANT

## 2019-09-18 NOTE — Evaluation (Signed)
Physical Therapy Evaluation Patient Details Name: Travis Huff MRN: [id number] DOB: 01-22-1960 Today's Date: 09/18/2019   History of Present Illness  59 year old man s/p R reverse TSA with a PMH hx of PEs, CAD, Arrhythmias, HTN, peripheral neuropathy and multiple joint replacements.  Clinical Impression  Pt admitted as above and presenting with functional mobility limitations 2* balance impairment.  Pt with noted instability 2* sensory changes related to bil LE peripheral neuropathy..  Pt partially compensating with widened BOS but requires external support as well. Prior to admit pt utilizing cane in one hand and stabilizing on available furniture etc with other.  This date, pt ambulated ~100' with hemi-walker, min assist/min guard and cues for sequencing and hemi-walker management.  Pt also up/down 2 stairs with single rail and min assist.  Pt and spouse report they feel comfortable returning home as wife is RN and they have been managing ongoing balance deficits long term.     Follow Up Recommendations No PT follow up    Equipment Recommendations  None recommended by PT    Recommendations for Other Services       Precautions / Restrictions Precautions Precautions: Shoulder Type of Shoulder Precautions: If sitting in controlled environment, ok to come out of sling to give neck a break. Please sleep in it to protect until follow up in office. OK to use operative arm for feeding, hygiene and ADLs. Ok to instruct Pendulums and lap slides as exercises. Ok to use operative arm within the following parameters for ADL purposes New ROM (8/18) Ok for PROM, AAROM, AROM within pain tolerance and within the following ROM ER 20 ABD 45 FE 60 Shoulder Interventions: Shoulder sling/immobilizer;Off for dressing/bathing/exercises Restrictions Weight Bearing Restrictions: Yes RUE Weight Bearing: Non weight bearing      Mobility  Bed Mobility               General bed mobility comments: Pt up  in recliner, no bed in room  Transfers Overall transfer level: Needs assistance Equipment used: Hemi-walker Transfers: Sit to/from Stand Sit to Stand: Min guard         General transfer comment: steady assist for safety only  Ambulation/Gait Ambulation/Gait assistance: Min assist;Min guard Gait Distance (Feet): 100 Feet Assistive device: Hemi-walker Gait Pattern/deviations: Step-to pattern;Step-through pattern;Decreased step length - right;Decreased step length - left;Shuffle;Wide base of support Gait velocity: decr   General Gait Details: Pt with noted instability 2* sensory changes related to bil LE peripheral neuropathy..  Pt partially compensating with widened BOS but requires external support as well.  Cues for sequence and to advance hemi-walker seperately and not with R LE   Stairs Stairs: Yes Stairs assistance: Min assist Stair Management: One rail Left;Step to pattern;Forwards Number of Stairs: 2 General stair comments: min cues for sequence; assist to stabilize on each step  Wheelchair Mobility    Modified Rankin (Stroke Patients Only)       Balance Overall balance assessment: Needs assistance Sitting-balance support: No upper extremity supported Sitting balance-Leahy Scale: Good     Standing balance support: Single extremity supported Standing balance-Leahy Scale: Poor                               Pertinent Vitals/Pain Pain Assessment: No/denies pain    Home Living Family/patient expects to be discharged to:: Private residence Living Arrangements: Spouse/significant other Available Help at Discharge: Family;Available 24 hours/day Type of Home: House Home Access: Stairs to  enter Entrance Stairs-Rails: Right;Left;Can reach both Entrance Stairs-Number of Steps: 4 Home Layout: One level Home Equipment: Walker - 2 wheels;Cane - single point;Shower seat Additional Comments: hemiwalker    Prior Function Level of Independence: Independent  with assistive device(s)         Comments: Pt's wife is an Glass blower/designer [employer]   Dominant Hand: Right    Extremity/Trunk Assessment   Upper Extremity Assessment Upper Extremity Assessment: RUE deficits/detail RUE Deficits / Details: Non functional use of RUE secondary to interscalene block.    Lower Extremity Assessment Lower Extremity Assessment: RLE deficits/detail;LLE deficits/detail RLE Deficits / Details: Strength WFL but sensory impairment up to knees 2* peripheral neuropathy RLE Sensation: history of peripheral neuropathy LLE Deficits / Details: as R LE LLE Sensation: history of peripheral neuropathy    Cervical / Trunk Assessment Cervical / Trunk Assessment: Normal  Communication   Communication: No difficulties  Cognition Arousal/Alertness: Awake/alert Behavior During Therapy: WFL for tasks assessed/performed Overall Cognitive Status: Within Functional Limits for tasks assessed                                        General Comments      Exercises Donning/doffing shirt without moving shoulder: Caregiver independent with task Method for sponge bathing under operated UE: Caregiver independent with task;Patient able to independently direct caregiver Donning/doffing sling/immobilizer: Caregiver independent with task Correct positioning of sling/immobilizer: Independent;Caregiver independent with task Pendulum exercises (written home exercise program): Independent;Caregiver independent with task ROM for elbow, wrist and digits of operated UE: Independent;Caregiver independent with task Sling wearing schedule (on at all times/off for ADL's): Independent;Caregiver independent with task Proper positioning of operated UE when showering: Independent;Caregiver independent with task Dressing change: Independent;Caregiver independent with task Positioning of UE while sleeping: Independent;Caregiver independent with task   Assessment/Plan    PT  Assessment Patent does not need any further PT services  PT Problem List Decreased activity tolerance;Decreased balance;Decreased mobility;Decreased knowledge of use of DME;Obesity       PT Treatment Interventions DME instruction;Gait training;Stair training;Functional mobility training;Therapeutic activities;Patient/family education    PT Goals (Current goals can be found in the Care Plan section)  Acute Rehab PT Goals Patient Stated Goal: to not fall PT Goal Formulation: All assessment and education complete, DC therapy Time For Goal Achievement: 09/18/19    Frequency Min 1X/week   Barriers to discharge        Co-evaluation               AM-PAC PT "6 Clicks" Mobility  Outcome Measure Help needed turning from your back to your side while in a flat bed without using bedrails?: A Little Help needed moving from lying on your back to sitting on the side of a flat bed without using bedrails?: A Little Help needed moving to and from a bed to a chair (including a wheelchair)?: A Little Help needed standing up from a chair using your arms (e.g., wheelchair or bedside chair)?: A Little Help needed to walk in hospital room?: A Little Help needed climbing 3-5 steps with a railing? : A Little 6 Click Score: 18    End of Session Equipment Utilized During Treatment: Gait belt Activity Tolerance: Patient tolerated treatment well;Patient limited by fatigue Patient left: in chair;with call bell/phone within reach;with family/visitor present Nurse Communication: Mobility status PT Visit Diagnosis: Unsteadiness on feet (R26.81);Difficulty in walking, not  elsewhere classified (R26.2)    Time: 7035-0093 PT Time Calculation (min) (ACUTE ONLY): 14 min   Charges:   PT Evaluation $PT Eval Low Complexity: 1 Low          [facility] Pager [phone] Office [phone]   Diavian Furgason 09/18/2019, 5:25 PM

## 2019-09-18 NOTE — Anesthesia Postprocedure Evaluation (Signed)
Anesthesia Post Note  Patient: Travis Huff  Procedure(s) Performed: REVERSE SHOULDER ARTHROPLASTY (Right Shoulder)     Patient location during evaluation: PACU Anesthesia Type: General Level of consciousness: sedated Pain management: pain level controlled Vital Signs Assessment: post-procedure vital signs reviewed and stable Respiratory status: spontaneous breathing and respiratory function stable Cardiovascular status: stable Postop Assessment: no apparent nausea or vomiting Anesthetic complications: no   No complications documented.  Last Vitals:  Vitals:   09/18/19 1300 09/18/19 1315  BP: 105/71 (!) 102/58  Pulse: 66 60  Resp: (!) 21 15  Temp:    SpO2: 100% 100%    Last Pain:  Vitals:   09/18/19 1315  TempSrc:   PainSc: 0-No pain                 Kaliel Bolds DANIEL

## 2019-09-18 NOTE — Transfer of Care (Signed)
Immediate Anesthesia Transfer of Care Note  Patient: Travis Huff  Procedure(s) Performed: REVERSE SHOULDER ARTHROPLASTY (Right Shoulder)  Patient Location: PACU  Anesthesia Type:GA combined with regional for post-op pain  Level of Consciousness: awake, oriented, patient cooperative and responds to stimulation  Airway & Oxygen Therapy: Patient Spontanous Breathing and Patient connected to face mask oxygen  Post-op Assessment: Report given to RN and Post -op Vital signs reviewed and stable  Post vital signs: Reviewed and stable  Last Vitals:  Vitals Value Taken Time  BP 108/67   Temp    Pulse 71 09/18/19 1257  Resp 21 09/18/19 1257  SpO2 100 % 09/18/19 1257  Vitals shown include unvalidated device data.  Last Pain:  Vitals:   09/18/19 0801  TempSrc: Oral         Complications: No complications documented.

## 2019-09-18 NOTE — Progress Notes (Signed)
Assisted Dr. Singer with right, ultrasound guided, interscalene  block. Side rails up, monitors on throughout procedure. See vital signs in flow sheet. Tolerated Procedure well. 

## 2019-09-18 NOTE — Telephone Encounter (Signed)
-----   Message from Loren Racer, RN sent at 09/17/2019  2:59 PM EDT ----- Sleep study ordered for pt.  He would like to do a home study if possible.

## 2019-09-18 NOTE — Discharge Instructions (Signed)
 Travis Huff, M.D., F.A.A.O.S. Orthopaedic Surgery Specializing in Arthroscopic and Reconstructive Surgery of the Shoulder [phone] [address] - [place], [place] [address] - Fax [phone]   POST-OP TOTAL SHOULDER REPLACEMENT INSTRUCTIONS  1. Follow up in the office for your first post-op appointment 10-14 days from the date of your surgery. If you do not already have a scheduled appointment, our office will contact you to schedule.  2. The bandage over your incision is waterproof. You may begin showering with this dressing on. You may leave this dressing on until first follow up appointment within 2 weeks. We prefer you leave this dressing in place until follow up however after 5-7 days if you are having itching or skin irritation and would like to remove it you may do so. Go slow and tug at the borders gently to break the bond the dressing has with the skin. At this point if there is no drainage it is okay to go without a bandage or you may cover it with a light guaze and tape. You can also expect significant bruising around your shoulder that will drift down your arm and into your chest wall. This is very normal and should resolve over several days.   3. Wear your sling/immobilizer at all times except to perform the exercises below or to occasionally let your arm dangle by your side to stretch your elbow. You also need to sleep in your sling immobilizer until instructed otherwise. It is ok to remove your sling if you are sitting in a controlled environment and allow your arm to rest in a position of comfort by your side or on your lap with pillows to give your neck and skin a break from the sling. You may remove it to allow arm to dangle by side to shower. If you are up walking around and when you go to sleep at night you need to wear it.  4. Range of motion to your elbow, wrist, and hand are encouraged 3-5 times daily. Exercise to your hand and fingers helps to reduce  swelling you may experience.   5. Prescriptions for a pain medication and a muscle relaxant are provided for you. It is recommended that if you are experiencing pain that you pain medication alone is not controlling, add the muscle relaxant along with the pain medication which can give additional pain relief. The first 1-2 days is generally the most severe of your pain and then should gradually decrease. As your pain lessens it is recommended that you decrease your use of the pain medications to an "as needed basis'" only and to always comply with the recommended dosages of the pain medications.  6. Pain medications can produce constipation along with their use. If you experience this, the use of an over the counter stool softener or laxative daily is recommended.   7. For additional questions or concerns, please do not hesitate to call the office. If after hours there is an answering service to forward your concerns to the physician on call.  8.Pain control following an exparel block  To help control your post-operative pain you received a nerve block  performed with Exparel which is a long acting anesthetic (numbing agent) which can provide pain relief and sensations of numbness (and relief of pain) in the operative shoulder and arm for up to 3 days. Sometimes it provides mixed relief, meaning you may still have numbness in certain areas of the arm but can still be able to   move  parts of that arm, hand, and fingers. We recommend that your prescribed pain medications  be used as needed. We do not feel it is necessary to "pre medicate" and "stay ahead" of pain.  Taking narcotic pain medications when you are not having any pain can lead to unnecessary and potentially dangerous side effects.    9. Use the ice machine as much as possible in the first 5-7 days from surgery, then you can wean its use to as needed. The ice typically needs to be replaced every 6 hours, instead of ice you can actually freeze  water bottles to put in the cooler and then fill water around them to avoid having to purchase ice. You can have spare water bottles freezing to allow you to rotate them once they have melted. Try to have a thin shirt or light cloth or towel under the ice wrap to protect your skin.   10.  We recommend that you avoid any dental work or cleaning in the first 3 months following your joint replacement. This is to help minimize the possibility of infection from the bacteria in your mouth that enters your bloodstream during dental work. We also recommend that you take an antibiotic prior to your dental work for the first year after your shoulder replacement to further help reduce that risk. Please simply contact our office for antibiotics to be sent to your pharmacy prior to dental work.  11. Dental Antibiotics:  In most cases prophylactic antibiotics for Dental procdeures after total joint surgery are not necessary.  Exceptions are as follows:  1. History of prior total joint infection  2. Severely immunocompromised (Organ Transplant, cancer chemotherapy, Rheumatoid biologic meds such as Humera)  3. Poorly controlled diabetes (A1C &gt; 8.0, blood glucose over 200)  If you have one of these conditions, contact your surgeon for an antibiotic prescription, prior to your dental procedure.   POST-OP EXERCISES  Pendulum Exercises  Perform pendulum exercises while standing and bending at the waist. Support your uninvolved arm on a table or chair and allow your operated arm to hang freely. Make sure to do these exercises passively - not using you shoulder muscles. These exercises can be performed once your nerve block effects have worn off.  Repeat 20 times. Do 3 sessions per day.     

## 2019-09-18 NOTE — Anesthesia Procedure Notes (Signed)
Procedure Name: Intubation Date/Time: 09/18/2019 10:38 AM Performed by: Silas Sacramento, CRNA Pre-anesthesia Checklist: Patient identified, Emergency Drugs available, Suction available and Patient being monitored Patient Re-evaluated:Patient Re-evaluated prior to induction Oxygen Delivery Method: Circle system utilized Preoxygenation: Pre-oxygenation with 100% oxygen Induction Type: IV induction and Rapid sequence Laryngoscope Size: Mac and 4 Grade View: Grade I Tube type: Oral Tube size: 7.5 mm Number of attempts: 1 Airway Equipment and Method: Stylet Placement Confirmation: ETT inserted through vocal cords under direct vision,  positive ETCO2 and breath sounds checked- equal and bilateral Secured at: 22 cm Tube secured with: Tape Dental Injury: Teeth and Oropharynx as per pre-operative assessment

## 2019-09-18 NOTE — H&P (Signed)
Travis Huff    Chief Complaint: Right shoulder rotator cuff tear arthropathy HPI: The patient is a 58 y.o. male with chronic bilateral shoulder pain related to advanced osteoarthritis.  On the right shoulder he in addition has a known chronic massive irreparable rotator cuff tear.  Due to his increasing pain and functional rotations and failure to respond to conservative management he is brought to the operating this time for planned right shoulder reverse arthroplasty.  Patient does have an extensive past medical history which includes the use of chronic anticoagulation for history multiple PEs.  Past Medical History:  Diagnosis Date  . Arrhythmia   . Arthritis   . Asthma    as child  . Avascular necrosis of bone of left hip ([place])   . Back pain   . Blood clot in vein 06/2016   x 2 no bleeding disorders found after back surgery  . Complication of anesthesia    ketamine hallucinations  . Coronary artery disease   . Dislocated hip, left, initial encounter ([facility]) 10/03/2018  . Dysrhythmia 2020   a-fib  . Hypertension   . Neuromuscular disorder ([place])    neuropathy bi lat legs knee down  . PAF (paroxysmal atrial fibrillation) ([facility] [facility])   . Viral pericarditis 20 yrs ago    Past Surgical History:  Procedure Laterality Date  . ABDOMINAL EXPOSURE N/A 06/23/2016   Procedure: ABDOMINAL EXPOSURE;  Surgeon: Rosetta Posner, MD;  Location: [facility];  Service: Vascular;  Laterality: N/A;  . ANTERIOR LATERAL LUMBAR FUSION 4 LEVELS Right 06/23/2016   Procedure: Right  Lumbar two-three Lumbar three-four Lumbar four-five Anterolateral lumbar interbody fusion;  Surgeon: Erline Levine, MD;  Location: [facility];  Service: Neurosurgery;  Laterality: Right;  . ANTERIOR LUMBAR FUSION N/A 06/23/2016   Procedure: Lumbar five -sacral one  Anterior lumbar interbody fusion with Dr. Sherren Mocha Early for approach;  Surgeon: Erline Levine, MD;  Location: [facility];  Service: Neurosurgery;  Laterality: N/A;  . APPLICATION OF  INTRAOPERATIVE CT SCAN N/A 06/26/2016   Procedure: APPLICATION OF INTRAOPERATIVE CT SCAN;  Surgeon: Erline Levine, MD;  Location: [facility];  Service: Neurosurgery;  Laterality: N/A;  . BACK SURGERY    . CARDIAC CATHETERIZATION    . CARDIOVASCULAR STRESS TEST     Negative in May 2014  . COLONOSCOPY    . HERNIA REPAIR     umbilical  . HIP CLOSED REDUCTION Left 10/03/2018   Procedure: CLOSED REDUCTION HIP;  Surgeon: Rod Can, MD;  Location: [facility];  Service: Orthopedics;  Laterality: Left;  . IR IVC FILTER PLMT / S&I Burke Keels GUID/MOD SED  03/26/2018  . IR RADIOLOGIST EVAL & MGMT  04/16/2019  . JOINT REPLACEMENT    . LEFT HEART CATH AND CORONARY ANGIOGRAPHY N/A 06/21/2018   Procedure: LEFT HEART CATH AND CORONARY ANGIOGRAPHY;  Surgeon: Jettie Booze, MD;  Location: [facility];  Service: Cardiovascular;  Laterality: N/A;  . POSTERIOR LUMBAR FUSION 4 LEVEL N/A 06/26/2016   Procedure: Thoracic ten to Pelvis fixation with AIRO;  Surgeon: Erline Levine, MD;  Location: [facility];  Service: Neurosurgery;  Laterality: N/A;  . TENDON REPAIR     right hand  . TOTAL HIP ARTHROPLASTY Right 05/25/2014   Procedure: RIGHT TOTAL HIP ARTHROPLASTY ANTERIOR APPROACH;  Surgeon: Rod Can, MD;  Location: [facility];  Service: Orthopedics;  Laterality: Right;  . TOTAL HIP ARTHROPLASTY Left 03/27/2018   Procedure: TOTAL HIP ARTHROPLASTY ANTERIOR APPROACH- DA complex;  Surgeon: Rod Can, MD;  Location: [facility];  Service: Orthopedics;  Laterality: Left;  . TOTAL KNEE ARTHROPLASTY Bilateral     Family History  Adopted: Yes  Problem Relation Age of Onset  . Healthy Daughter   . Healthy Daughter     Social History:  reports that he quit smoking about 21 years ago. His smoking use included cigarettes. He has a 25.00 pack-year smoking history. His smokeless tobacco use includes snuff. He reports current alcohol use of about 14.0 standard drinks of alcohol per week. He reports that he does not use  drugs.   Medications Prior to Admission  Medication Sig Dispense Refill  . acetaminophen (TYLENOL) 325 MG tablet Take 650 mg by mouth every 6 (six) hours as needed for mild pain or headache.    [name] atorvastatin (LIPITOR) 80 MG tablet Take 1 tablet (80 mg total) by mouth daily at 6 PM. 90 tablet 2  . cyclobenzaprine (FLEXERIL) 10 MG tablet Take 40 mg by mouth at bedtime.    [name] diltiazem (CARDIZEM CD) 120 MG 24 hr capsule Take 1 capsule (120 mg total) by mouth daily. 30 capsule 2  . docusate sodium (COLACE) 100 MG capsule Take 1 capsule (100 mg total) by mouth 2 (two) times daily. 60 capsule 1  . DULoxetine (CYMBALTA) 60 MG capsule TAKE 1 CAPSULE (60 MG TOTAL) BY MOUTH AT BEDTIME. 90 capsule 3  . lisinopril-hydrochlorothiazide (ZESTORETIC) 20-25 MG tablet Take 1 tablet by mouth daily. 90 tablet 3  . metoprolol succinate (TOPROL-XL) 100 MG 24 hr tablet Take 2 tablets (200 mg total) by mouth daily. Take 2 tablets by mouth in the morning 180 tablet 3  . MULTAQ 400 MG tablet TAKE 1 TABLET (400 MG TOTAL) BY MOUTH 2 (TWO) TIMES DAILY WITH A MEAL. 60 tablet 3  . zolpidem (AMBIEN) 10 MG tablet TAKE 1 TABLET BY MOUTH EVERY NIGHT AT BEDTIME AS NEEDED FOR SLEEP 30 tablet 1  . apixaban (ELIQUIS) 5 MG TABS tablet Take 5 mg by mouth 2 (two) times daily.    . Ascorbic Acid (VITAMIN C) 500 MG CAPS Take 1 capsule by mouth daily.     . Multiple Vitamin (DAILY VITAMIN PO) Taking one tablet by mouth daily- Calcium, magnesium and zince tablet     . nitroGLYCERIN (NITROSTAT) 0.4 MG SL tablet Place 1 tablet (0.4 mg total) under the tongue every 5 (five) minutes x 3 doses as needed for chest pain. If taking third dose, call 911. 25 tablet 3  . polyethylene glycol (MIRALAX / GLYCOLAX) 17 g packet Take 17 g by mouth every other day.     . terbinafine (LAMISIL) 250 MG tablet TAKE 1 TABLET (250 MG TOTAL) BY MOUTH DAILY. 90 tablet 1     Physical Exam: Right shoulder demonstrates a painful and guarded range of motion as noted  at his recent office visits.  His plain radiographs are reviewed which confirm complete obliteration of the joint space with significant bony erosion and deformity and changes consistent with a chronic rotator cuff tear arthropathy on the right.  Vitals  Temp:  [99.4 F (37.4 C)] 99.4 F (37.4 C) (09/16 0801) Pulse Rate:  [26-103] 59 (09/16 0916) Resp:  [11-27] 21 (09/16 0916) BP: (106-132)/(58-81) 110/80 (09/16 0906) SpO2:  [79 %-100 %] 100 % (09/16 0916) Weight:  [148.8 kg] 148.8 kg (09/16 0812)  Assessment/Plan  Impression: Right shoulder rotator cuff tear arthropathy  Plan of Action: Procedure(s): REVERSE SHOULDER ARTHROPLASTY  Travis Huff 09/18/2019, 9:45 AM Contact # [phone]

## 2019-09-18 NOTE — Evaluation (Signed)
Occupational Therapy Evaluation Patient Details Name: Travis Huff MRN: [id number] DOB: 1960-03-12 Today's Date: 09/18/2019    History of Present Illness 59 year old man s/p R reverse TSA with a PMH hx of PEs, CAD, Arrhythmias, HTN, peripheral neuropathy and multiple joint replacements.   Clinical Impression   Travis Huff is a 58 year old man who presents s/p shoulder replacement without functional use of right dominant upper extremity secondary to effects of surgery, interscalene block and shoulder precautions. Therapist provided education and instruction to patient and spouse in regards to exercises, precautions, positioning, donning upper extremity clothing and bathing while maintaining shoulder precautions, ice and edema management and donning/doffing sling. Patient and spouse verbalized understanding and demonstrated as needed. Patient needed assistance to donn shirt, shorts and socks and therapist provided patient and family with instructions on compensatory strategies to perform ADLs. Significant time provided for instruction and reiteration for patient and spouses confidence and comfort. Patient's mobility significantly limited by his baseline impaired balance and peripheral neuropathy. Performed sit to stand with hand hold assist, with single point cane and then with hemiwalker to improve patient's stability in order to return home. Requested PT consult from MD. Patient to follow up with MD for further therapy needs.      Follow Up Recommendations  Supervision/Assistance - 24 hour;Follow surgeon's recommendation for DC plan and follow-up therapies    Equipment Recommendations  Other (comment) (hemiwalker)    Recommendations for Other Services PT consult     Precautions / Restrictions Precautions Precautions: Shoulder Type of Shoulder Precautions: If sitting in controlled environment, ok to come out of sling to give neck a break. Please sleep in it to protect until follow up  in office. OK to use operative arm for feeding, hygiene and ADLs. Ok to instruct Pendulums and lap slides as exercises. Ok to use operative arm within the following parameters for ADL purposes New ROM (8/18) Ok for PROM, AAROM, AROM within pain tolerance and within the following ROM ER 20 ABD 45 FE 60 Shoulder Interventions: Shoulder sling/immobilizer;Off for dressing/bathing/exercises Restrictions Weight Bearing Restrictions: Yes RUE Weight Bearing: Non weight bearing      Mobility Bed Mobility                  Transfers                 General transfer comment: Min assist for sit to stand and ambulation. Patient limited by severe bilateral neurophaty in LEs. Patient's wife  very fearful of patient ambulating due to his significantly poor balance. Stood x 1 with hand hol assist with patient requesting to sit down. Ambulated 8 feet with cane with patient unsteady and patient's wife continued to be fearful. Ambulated with hemiwalker approx 12 feet with improved stability - patient's wife exhibited improved confidence with dischargin home with patient.    Balance Overall balance assessment: Needs assistance Sitting-balance support: No upper extremity supported Sitting balance-Leahy Scale: Good     Standing balance support: Single extremity supported Standing balance-Leahy Scale: Poor                             ADL either performed or assessed with clinical judgement   ADL Overall ADL's : Needs assistance/impaired Eating/Feeding: Set up   Grooming: Modified independent   Upper Body Bathing: Minimal assistance   Lower Body Bathing: Minimal assistance;Sit to/from stand   Upper Body Dressing : Moderate assistance;Sitting;Cueing for UE precautions  Lower Body Dressing: Sit to/from stand;Maximal assistance   Toilet Transfer: Minimal assistance;Ambulation   Toileting- Clothing Manipulation and Hygiene: Minimal assistance;Sit to/from stand        Functional mobility during ADLs: Minimal assistance General ADL Comments: use of hemiwalker     Vision Baseline Vision/History: No visual deficits       Perception     Praxis      Pertinent Vitals/Pain Pain Assessment: No/denies pain     Hand Dominance Right   Extremity/Trunk Assessment Upper Extremity Assessment Upper Extremity Assessment: RUE deficits/detail RUE Deficits / Details: Non functional use of RUE secondary to interscalene block.           Communication Communication Communication: No difficulties   Cognition Arousal/Alertness: Awake/alert Behavior During Therapy: WFL for tasks assessed/performed Overall Cognitive Status: Within Functional Limits for tasks assessed                                     General Comments       Exercises     Shoulder Instructions Shoulder Instructions Donning/doffing shirt without moving shoulder: Caregiver independent with task Method for sponge bathing under operated UE: Caregiver independent with task;Patient able to independently direct caregiver Donning/doffing sling/immobilizer: Caregiver independent with task Correct positioning of sling/immobilizer: Independent;Caregiver independent with task Pendulum exercises (written home exercise program): Independent;Caregiver independent with task ROM for elbow, wrist and digits of operated UE: Independent;Caregiver independent with task Sling wearing schedule (on at all times/off for ADL's): Independent;Caregiver independent with task Proper positioning of operated UE when showering: Independent;Caregiver independent with task Dressing change: Independent;Caregiver independent with task Positioning of UE while sleeping: Independent;Caregiver independent with task    Home Living Family/patient expects to be discharged to:: Private residence Living Arrangements: Spouse/significant other Available Help at Discharge: Family;Available 24 hours/day Type of  Home: House Home Access: Stairs to enter [employer] of Steps: 4 Entrance Stairs-Rails: Right;Left;Can reach both Home Layout: One level     Bathroom Shower/Tub: Occupational psychologist: Handicapped height Bathroom Accessibility: Yes   Home Equipment: Environmental consultant - 2 wheels;Cane - single point;Shower seat   Additional Comments: hemiwalker      Prior Functioning/Environment Level of Independence: Independent with assistive device(s)                 OT Problem List: Decreased strength;Decreased range of motion;Impaired balance (sitting and/or standing);Impaired UE functional use;Obesity;Decreased knowledge of use of DME or AE      OT Treatment/Interventions:      OT Goals(Current goals can be found in the care plan section) Acute Rehab OT Goals Patient Stated Goal: to not fall OT Goal Formulation: All assessment and education complete, DC therapy  OT Frequency:     Barriers to D/C:            Co-evaluation              AM-PAC OT "6 Clicks" Daily Activity     Outcome Measure Help from another person eating meals?: A Little Help from another person taking care of personal grooming?: A Little Help from another person toileting, which includes using toliet, bedpan, or urinal?: A Little Help from another person bathing (including washing, rinsing, drying)?: A Little Help from another person to put on and taking off regular upper body clothing?: A Lot Help from another person to put on and taking off regular lower body clothing?: A Lot 6 Click  Score: 16   End of Session Equipment Utilized During Treatment: Gait belt;Other (comment) (hemiwalker, cane) Nurse Communication: Mobility status  Activity Tolerance: Patient tolerated treatment well Patient left: in chair;with family/visitor present  OT Visit Diagnosis: Muscle weakness (generalized) (M62.81);Unsteadiness on feet (R26.81)                Time: 1450-1559 OT Time Calculation (min): 69  min Charges:  OT General Charges $OT Visit: 1 Visit OT Evaluation $OT Eval Moderate Complexity: 1 Mod OT Treatments $Self Care/Home Management : 8-22 mins $Therapeutic Activity: 23-37 mins  Travis Huff, OTR/L Acute Care Rehab Services  Office [phone] Pager: [place] 09/18/2019, 4:34 PM

## 2019-09-18 NOTE — Anesthesia Procedure Notes (Signed)
Anesthesia Regional Block: Interscalene brachial plexus block   Pre-Anesthetic Checklist: ,, timeout performed, Correct Patient, Correct Site, Correct Laterality, Correct Procedure, Correct Position, site marked, Risks and benefits discussed,  Surgical consent,  Pre-op evaluation,  At surgeon's request and post-op pain management  Laterality: Right  Prep: chloraprep       Needles:  Injection technique: Single-shot  Needle Type: Echogenic Stimulator Needle     Needle Length: 5cm  Needle Gauge: 22     Additional Needles:   Narrative:  Start time: 09/18/2019 8:54 AM End time: 09/18/2019 9:04 AM Injection made incrementally with aspirations every 5 mL.  Performed by: Personally  Anesthesiologist: Duane Boston, MD  Additional Notes: Functioning IV was confirmed and monitors applied.  A 62mm 22ga echogenic arrow stimulator was used. Sterile prep and drape,hand hygiene and sterile gloves were used.Ultrasound guidance: relevant anatomy identified, needle position confirmed, local anesthetic spread visualized around nerve(s)., vascular puncture avoided.  Image printed for medical record.  Negative aspiration and negative test dose prior to incremental administration of local anesthetic. The patient tolerated the procedure well.

## 2019-09-18 NOTE — Op Note (Signed)
09/18/2019  12:25 PM  PATIENT:   Travis Huff  59 y.o. male  PRE-OPERATIVE DIAGNOSIS:  Right shoulder rotator cuff tear arthropathy  POST-OPERATIVE DIAGNOSIS: Same  PROCEDURE: Right shoulder reverse arthroplasty utilizing a press-fit size 8 Arthrex stem with a +3 polyethylene insert on a neutral metaphysis, 39/+4 glenosphere on a small/+2 baseplate  SURGEON:  Hristopher Missildine, Metta Clines M.D.  ASSISTANTS: Jenetta Loges, PA-C  ANESTHESIA:   General endotracheal and interscalene block with Exparel  EBL: 200 cc  SPECIMEN: None  Drains: None   PATIENT DISPOSITION:  PACU - hemodynamically stable.    PLAN OF CARE: Discharge to home after PACU  Brief history:  Travis Huff is a 59 year old gentleman who has had chronic and progressively increasing right shoulder pain related to advanced osteoarthritis with associated chronic rotator cuff tear and dysfunction.  Due to his increasing pain and functional rotations and failure to respond to conservative management he is brought to the operating this time for planned right shoulder reverse arthroplasty as described below  Preoperatively, I counseled the patient regarding treatment options and risks versus benefits thereof.  Possible surgical complications were all reviewed including potential for bleeding, infection, neurovascular injury, persistent pain, loss of motion, anesthetic complication, failure of the implant, and possible need for additional surgery. They understand and accept and agrees with our planned procedure.   Procedure in detail:  After undergoing routine preop evaluation patient received prophylactic antibiotics and interscalene block with Exparel was established in the holding area by the anesthesia department.  Patient subsequently placed supine on the operating table and underwent the smooth induction of a general endotracheal anesthesia.  Placed into the beachchair position and appropriately padded and protected.  The right  shoulder girdle region was sterilely prepped and draped in standard fashion.  Timeout was called.  An anterior deltopectoral approach to the right shoulder is made through a 12 cm incision.  Skin flaps were elevated dissection carried deeply and the deltopectoral interval was then developed proximal to this with the vein taken laterally.  Upper centimeter half of the pectoralis major was tenotomized for exposure and the conjoined tendon was mobilized and retracted medially and adhesions were divided beneath the deltoid.  The long head biceps tendon was then tenodesed at the upper border of the pectoralis major tendon with proximal segment unroofed and excised.  Remnant of the rotator cuff was split to the base of the coracoid with the subscapularis elevated subperiosteally and tagged.  We did find a number of large osteochondral loose bodies including one that was impregnated within the substance of the subscapularis on the anterior medial scapular neck region which was almost 3 x 4 x 2 cm in dimensions and this was carefully dissected free and excised a number of other 1 to 2 cm loose bodies were evacuated from the joint particular posteriorly and inferiorly we confirmed that all the major fragments were removed.  The capsular attachments from the anterior and infra margins of the humeral neck were removed divided and elevated in a subperiosteal fashion allowing deliver the humeral head to the wound.  We outlined our proposed humeral head resection with the extra medullary guide and this was then completed with an oscillating saw at the native retroversion approximate 20 degrees.  A metal cap was then placed at the cut proximal humeral surface after we had removed the large remaining peripheral osteophyte fragments from the humeral neck.  The glenoid was then exposed with the appropriate retractors and a circumferential labral resection was  completed.  There were large osteophytes at the margins of the glenoid with  some significant erosion.  A guidepin was then directed into the center of the glenoid with an approximately 5 degree inferior tilt and we reamed for our small glenoid using both the central and peripheral reamers and then a rondure was used to remove the remaining osteophytes at the margins of the glenoid particularly inferiorly.  Glenoid was then terminally prepared and a 30 mm lag screw was attached to the baseplate and the baseplate was then inserted with excellent fit and fixation.  The peripheral locking screws were all then placed using standard technique with excellent fit and fixation.  A 39/+4 glenosphere was impacted onto the baseplate and a locking screw was applied with excellent fixation.  We then turned our attention back to the proximal humerus where the canal was opened with a hand reamer we broached up to a size 8 at the native version of 20 degrees.  A neutral metaphyseal reamer was then utilized to terminally prepared the humeral metaphysis.  When this was completed a trial implant was placed and this showed excellent motion good stability good soft tissue balance.  The trial was removed and our final implant was assembled on the back table.  This was then impacted with excellent fixation.  We then performed productions and felt that the +6 poly ethylene insert gave [place] the best soft tissue balance.  The trial was removed the final +6 was then impacted and final reduction was then performed.  Again excellent motion good soft tissue balance good stability.  The wound was then copiously irrigated.  We confirmed good mobility of the subscapularis and it was then repaired back to the eyelets on the collar of our implant using the previously placed suture tape sutures which was used to tack the subscap at the beginning of the case.  Final irrigation was then completed.  Hemostasis was obtained.  The deltopectoral interval was reapproximated with a series of figure-of-eight #1 Vicryl sutures.  2-0  Vicryl used for the subcu layer and intracuticular 3-0 Monocryl for the skin followed by Dermabond and Aquacel dressing.  Right arm was placed into a sling the patient was awakened, extubated, and taken to recovery in stable addition.  Jenetta Loges, PA-C was utilized as an Environmental consultant throughout this case, essential for help with positioning the patient, positioning extremity, tissue manipulation, implantation of the prosthesis, suture management, wound closure, and intraoperative decision-making.  Marin Shutter MD   Contact # [phone]

## 2019-09-19 ENCOUNTER — Telehealth: Payer: Self-pay | Admitting: *Deleted

## 2019-09-19 ENCOUNTER — Encounter (HOSPITAL_COMMUNITY): Payer: Self-pay | Admitting: Orthopedic Surgery

## 2019-09-19 NOTE — Telephone Encounter (Signed)
Staff message sent to Gae Bon ok to schedule HST. Per Leander Rams  CSR on today. No PA is required.

## 2019-09-23 MED FILL — CARTIA XT 120 MG CP24: 120 | 30 days supply | Qty: 30 | Fill #1

## 2019-09-23 NOTE — Telephone Encounter (Signed)
Patient is aware and agreeable to Home Sleep Study through [facility]. Patient is scheduled for 11/05/19 at 11:00 to pick up home sleep kit and meet with Respiratory therapist at [facility]. Patient is aware that if this appointment date and time does not work for them they should contact Artis Delay directly at [phone]. Patient is aware that a sleep packet will be sent from [facility] in week.  Left detailed message on voicemail with date and time of titration and informed patient to call back to confirm or reschedule.

## 2019-10-01 DIAGNOSIS — Z96611 Presence of right artificial shoulder joint: Secondary | ICD-10-CM | POA: Diagnosis not present

## 2019-10-01 DIAGNOSIS — Z471 Aftercare following joint replacement surgery: Secondary | ICD-10-CM | POA: Diagnosis not present

## 2019-10-01 DIAGNOSIS — G629 Polyneuropathy, unspecified: Secondary | ICD-10-CM | POA: Diagnosis not present

## 2019-10-07 ENCOUNTER — Other Ambulatory Visit: Payer: Self-pay | Admitting: Cardiology

## 2019-10-07 ENCOUNTER — Other Ambulatory Visit: Payer: Self-pay | Admitting: Sports Medicine

## 2019-10-07 DIAGNOSIS — I10 Essential (primary) hypertension: Secondary | ICD-10-CM

## 2019-10-07 MED FILL — LISINOPRIL-HCTZ 20-25 MG TA: 20-25 | 90 days supply | Qty: 90 | Fill #0

## 2019-10-07 MED FILL — ATORVASTATIN 80 MG TABLET: 80 | 90 days supply | Qty: 90 | Fill #1

## 2019-10-07 MED FILL — ELIQUIS 5 MG TABLET: 5 | 30 days supply | Qty: 60 | Fill #0

## 2019-10-07 NOTE — Telephone Encounter (Signed)
Prescription refill request for Eliquis received.  Last office visit: [place], 08/28/2019 Scr: 0.97, 09/12/2019 Age: 59 y.o. Weight: 148 kg   Prescription refill sent.

## 2019-10-09 IMAGING — DX DG CHEST 2V
2 series · 2 of 2 positions shown · non-contrast
Comparison: Chest radiograph dated 06/20/2018

CLINICAL DATA: 58-year-old male with cough.

EXAM:
CHEST - 2 VIEW

[chest pa]
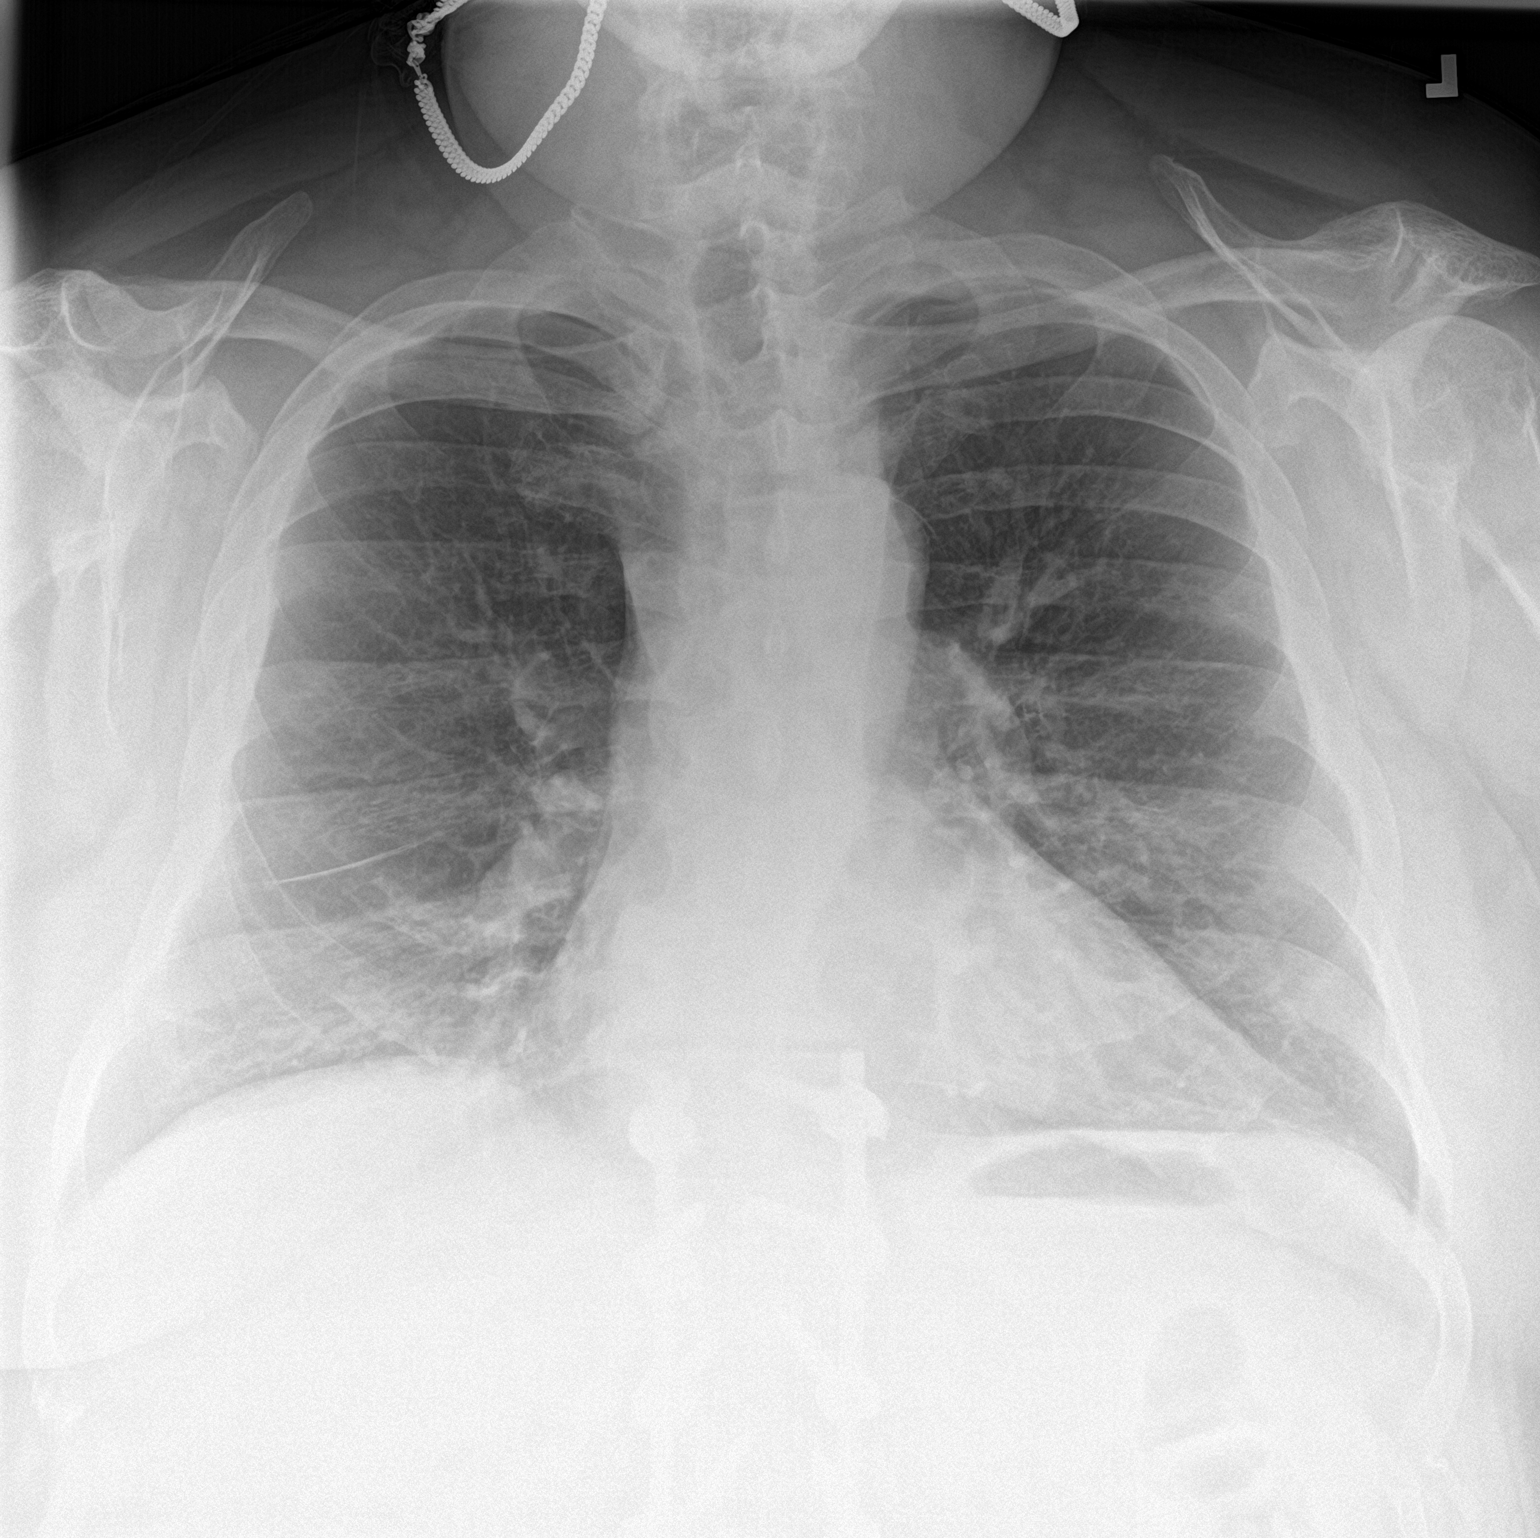

[chest lat]
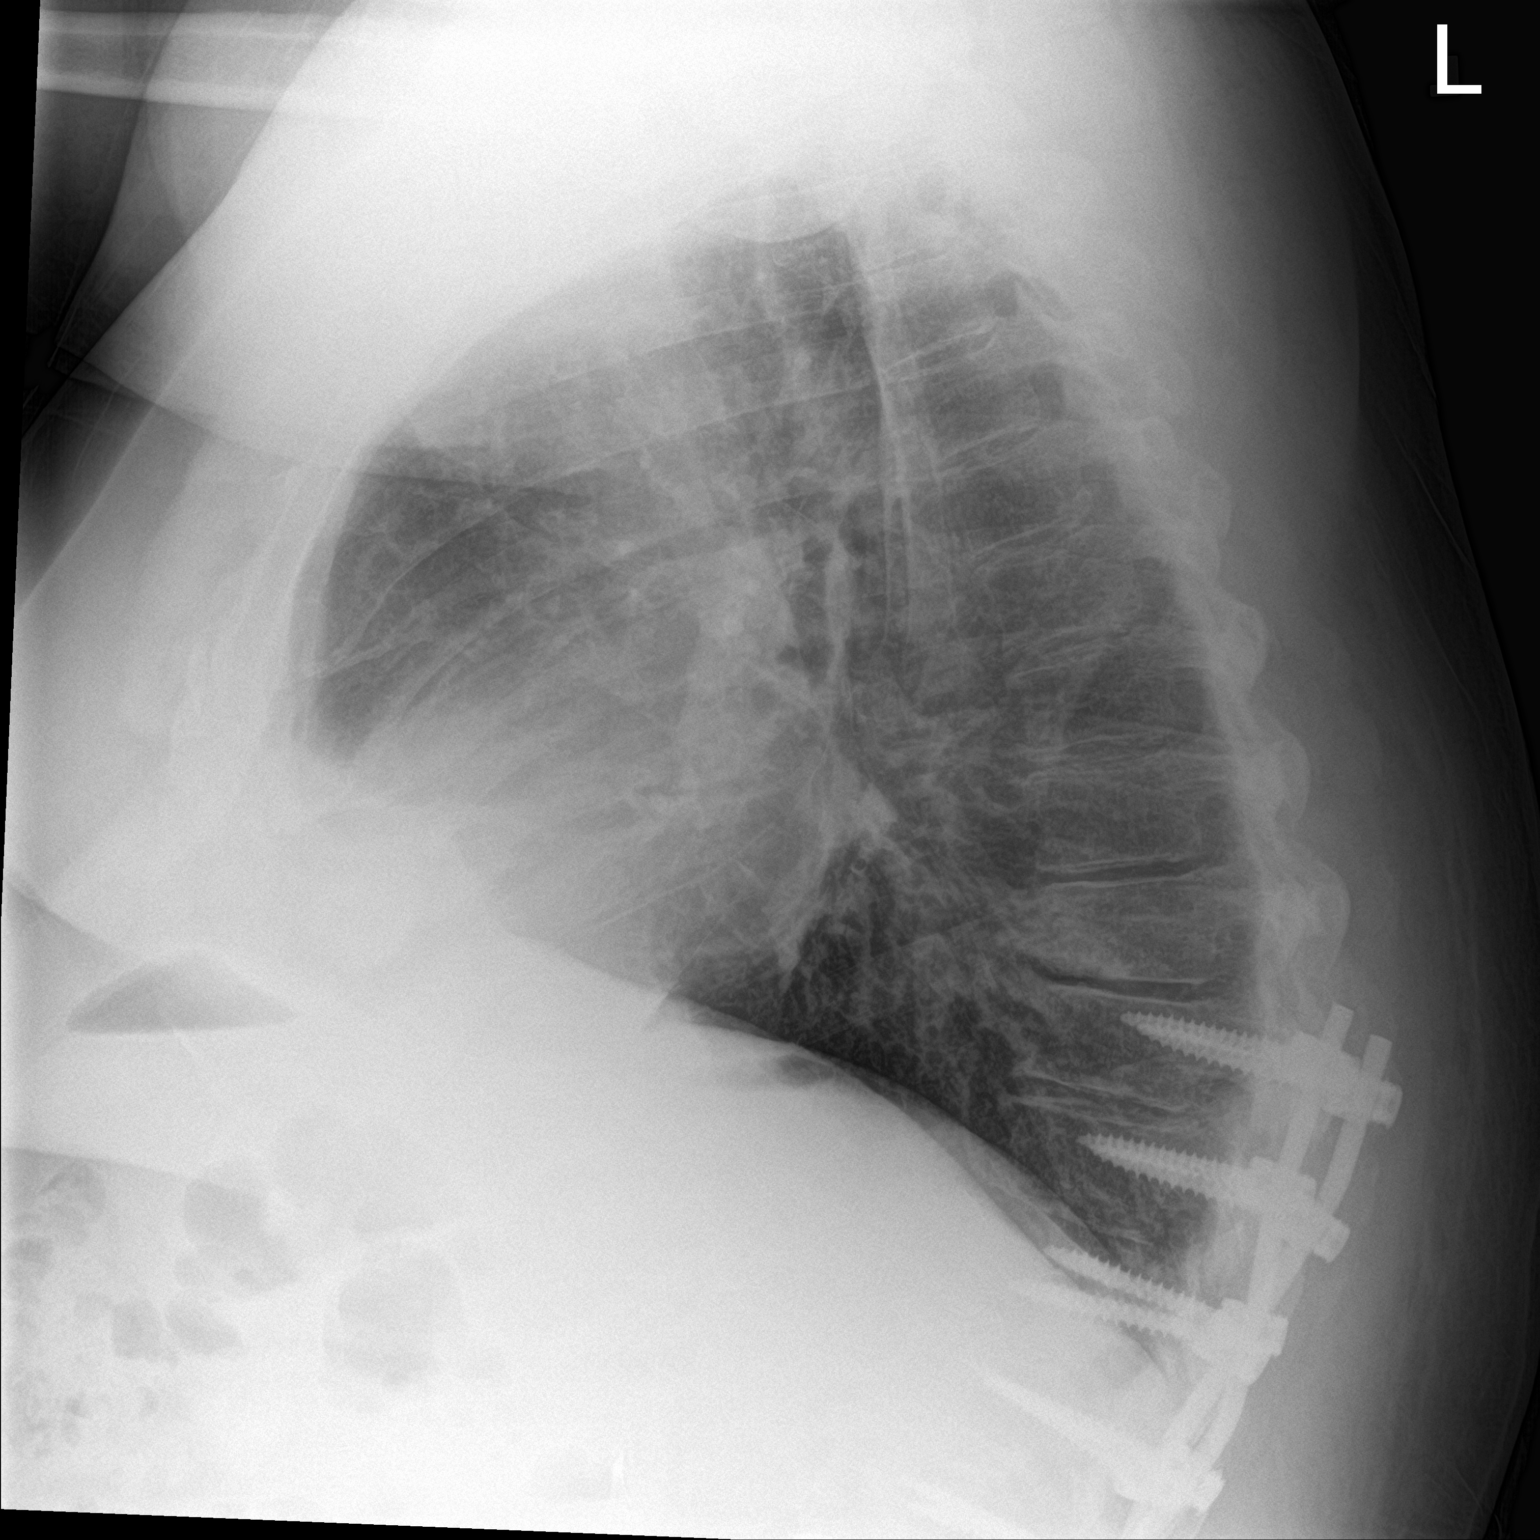

[2 of 2 positions shown; findings below may reference images not displayed]

FINDINGS: Bibasilar dependent atelectatic changes. No focal consolidation,
pleural effusion, or pneumothorax. Mild cardiomegaly. No acute
osseous pathology. Partially visualized posterior lower thoracic and
lumbar fixation hardware.
IMPRESSION: No active cardiopulmonary disease.

## 2019-10-13 MED FILL — MULTAQ 400 MG TABLET: 400 | 30 days supply | Qty: 60 | Fill #1

## 2019-10-13 MED FILL — ATORVASTATIN 80 MG TABLET: 80 | 90 days supply | Qty: 90 | Fill #0

## 2019-10-14 ENCOUNTER — Ambulatory Visit (INDEPENDENT_AMBULATORY_CARE_PROVIDER_SITE_OTHER): Payer: 59 | Admitting: Rehabilitative and Restorative Service Providers"

## 2019-10-14 ENCOUNTER — Encounter: Payer: Self-pay | Admitting: Rehabilitative and Restorative Service Providers"

## 2019-10-14 ENCOUNTER — Other Ambulatory Visit: Payer: Self-pay

## 2019-10-14 DIAGNOSIS — R601 Generalized edema: Secondary | ICD-10-CM

## 2019-10-14 DIAGNOSIS — R29898 Other symptoms and signs involving the musculoskeletal system: Secondary | ICD-10-CM | POA: Diagnosis not present

## 2019-10-14 DIAGNOSIS — M25611 Stiffness of right shoulder, not elsewhere classified: Secondary | ICD-10-CM | POA: Diagnosis not present

## 2019-10-14 DIAGNOSIS — M6281 Muscle weakness (generalized): Secondary | ICD-10-CM

## 2019-10-14 DIAGNOSIS — G8929 Other chronic pain: Secondary | ICD-10-CM | POA: Diagnosis not present

## 2019-10-14 DIAGNOSIS — M25511 Pain in right shoulder: Secondary | ICD-10-CM | POA: Diagnosis not present

## 2019-10-14 MED FILL — DULOXETINE HCL 60 MG CPEP: 60 | 90 days supply | Qty: 90 | Fill #2

## 2019-10-14 NOTE — Patient Instructions (Signed)
Access Code: [email or web address]: [email or web address]: 10/12/2021Prepared by: Keyanni Whittinghill HoltExercises  Seated Scapular Retraction - 2 x daily - 7 x weekly - 1-2 sets - 10 reps - 10 sec hold  Isometric Shoulder Abduction at Wall - 2 x daily - 7 x weekly - 1 sets - 5 reps - 5 sec hold  Isometric Shoulder External Rotation at Wall - 2 x daily - 7 x weekly - 1 sets - 5 reps - 5 sec hold  Seated Shoulder Pendulum Exercise - 2 x daily - 7 x weekly - 1 sets - 10-20 reps

## 2019-10-14 NOTE — Therapy (Signed)
[place], [place], [address] Phone: [phone]   Fax:  [phone]  Physical Therapy Evaluation  Patient Details  Name: Travis Huff MRN: [id number] Date of Birth: 14-Dec-1960 Referring Provider (PT): Dr Justice Britain    Encounter Date: 10/14/2019   PT End of Session - 10/14/19 1208    Visit Number 1    Number of Visits 20    Date for PT Re-Evaluation 12/23/19    PT Start Time 1100    PT Stop Time 1151    PT Time Calculation (min) 51 min    Activity Tolerance Patient tolerated treatment well           Past Medical History:  Diagnosis Date   Arrhythmia    Arthritis    Asthma    as child   Avascular necrosis of bone of left hip (HCC)    Back pain    Blood clot in vein 06/2016   x 2 no bleeding disorders found after back surgery   Complication of anesthesia    ketamine hallucinations   Coronary artery disease    Dislocated hip, left, initial encounter ([place]) 10/03/2018   Dysrhythmia 2020   a-fib   Hypertension    Neuromuscular disorder (HCC)    neuropathy bi lat legs knee down   PAF (paroxysmal atrial fibrillation) ([facility])    Viral pericarditis 20 yrs ago    Past Surgical History:  Procedure Laterality Date   ABDOMINAL EXPOSURE N/A 06/23/2016   Procedure: ABDOMINAL EXPOSURE;  Surgeon: Rosetta Posner, MD;  Location: [facility];  Service: Vascular;  Laterality: N/A;   ANTERIOR LATERAL LUMBAR FUSION 4 LEVELS Right 06/23/2016   Procedure: Right  Lumbar two-three Lumbar three-four Lumbar four-five Anterolateral lumbar interbody fusion;  Surgeon: Erline Levine, MD;  Location: [facility];  Service: Neurosurgery;  Laterality: Right;   ANTERIOR LUMBAR FUSION N/A 06/23/2016   Procedure: Lumbar five -sacral one  Anterior lumbar interbody fusion with Dr. Sherren Mocha Early for approach;  Surgeon: Erline Levine, MD;  Location: [facility];  Service: Neurosurgery;  Laterality: N/A;   APPLICATION OF  INTRAOPERATIVE CT SCAN N/A 06/26/2016   Procedure: APPLICATION OF INTRAOPERATIVE CT SCAN;  Surgeon: Erline Levine, MD;  Location: [facility];  Service: Neurosurgery;  Laterality: N/A;   BACK SURGERY     CARDIAC CATHETERIZATION     CARDIOVASCULAR STRESS TEST     Negative in May 2014   COLONOSCOPY     HERNIA REPAIR     umbilical   HIP CLOSED REDUCTION Left 10/03/2018   Procedure: CLOSED REDUCTION HIP;  Surgeon: Rod Can, MD;  Location: [facility];  Service: Orthopedics;  Laterality: Left;   IR IVC FILTER PLMT / S&I /IMG GUID/MOD SED  03/26/2018   IR RADIOLOGIST EVAL & MGMT  04/16/2019   JOINT REPLACEMENT     LEFT HEART CATH AND CORONARY ANGIOGRAPHY N/A 06/21/2018   Procedure: LEFT HEART CATH AND CORONARY ANGIOGRAPHY;  Surgeon: Jettie Booze, MD;  Location: [facility];  Service: Cardiovascular;  Laterality: N/A;   POSTERIOR LUMBAR FUSION 4 LEVEL N/A 06/26/2016   Procedure: Thoracic ten to Pelvis fixation with AIRO;  Surgeon: Erline Levine, MD;  Location: [facility];  Service: Neurosurgery;  Laterality: N/A;   REVERSE SHOULDER ARTHROPLASTY Right 09/18/2019   Procedure: REVERSE SHOULDER ARTHROPLASTY;  Surgeon: Justice Britain, MD;  Location: [facility];  Service: Orthopedics;  Laterality: Right;  148min   TENDON REPAIR     right hand  TOTAL HIP ARTHROPLASTY Right 05/25/2014   Procedure: RIGHT TOTAL HIP ARTHROPLASTY ANTERIOR APPROACH;  Surgeon: Rod Can, MD;  Location: [facility];  Service: Orthopedics;  Laterality: Right;   TOTAL HIP ARTHROPLASTY Left 03/27/2018   Procedure: TOTAL HIP ARTHROPLASTY ANTERIOR APPROACH- DA complex;  Surgeon: Rod Can, MD;  Location: [facility];  Service: Orthopedics;  Laterality: Left;   TOTAL KNEE ARTHROPLASTY Bilateral     There were no vitals filed for this visit.    Subjective Assessment - 10/14/19 1110    Subjective Patient reports that he has had Rt shoulder pain for 5 yrs with no known injury. Symptoms continued to increase. He underwent  Rt reverse TSA 10/01/19 wiht good pain reliev and no post op complications.    Patient Stated Goals use Rt arm for functional activities without irritating anything    Currently in Pain? No/denies    Pain Score 0-No pain    Pain Location Shoulder    Pain Orientation Right    Pain Frequency Intermittent              [facility] PT Assessment - 10/14/19 0001      Assessment   Medical Diagnosis Rt Reverse TSA    Referring Provider (PT) Dr Justice Britain     Onset Date/Surgical Date 09/18/19    Hand Dominance Right    Next MD Visit 11/21    Prior Therapy yes for knees; back; BPPV      Precautions   Precautions Shoulder    Type of Shoulder Precautions post op reverse TSA       Restrictions   Weight Bearing Restrictions No      Balance Screen   Has the patient fallen in the past 6 months Yes    How many times? 1    Has the patient had a decrease in activity level because of a fear of falling?  No    Is the patient reluctant to leave their home because of a fear of falling?  No      Prior Function   Level of Independence Independent    Vocation On disability   2019   Vocation Requirements printer     Leisure yard work; playing with dog       Observation/Other Assessments   Observations well healed incision anterior Rt shoulder     Focus on Therapeutic Outcomes (FOTO)  45% limitation      Observation/Other Assessments-Edema    Edema --   edema     Sensation   Additional Comments UE's WNL's; peripheral neuropathy bilat LE's x ~ 10 yrs       Posture/Postural Control   Posture Comments head forward; shoulders rounded; increased thoracic kyphosis; flexed forward at hips       AROM   Right/Left Shoulder --   pain with all ROM LT shoulder    Left Shoulder Extension 68 Degrees    Left Shoulder Flexion 126 Degrees    Left Shoulder ABduction 150 Degrees    Left Shoulder Internal Rotation --   thumb to T12   Left Shoulder External Rotation 69 Degrees      PROM   Right/Left  Shoulder --   pt in supine   Right Shoulder Flexion 90 Degrees    Right Shoulder ABduction 60 Degrees    Right Shoulder External Rotation 30 Degrees      Strength   Overall Strength Comments strength not tested moves UE's against gravity       Palpation  Palpation comment muscular tightness through the Rt shoulder girdle with tigntness noted through the pecs; anterior deltoid; biceps                       Objective measurements completed on examination: See above findings.       [facility] Adult PT Treatment/Exercise - 10/14/19 0001      Self-Care   Self-Care --   shoulder precautions      Therapeutic Activites    Therapeutic Activities --   sitting posture/support/swim noodle      Shoulder Exercises: Seated   Retraction AROM;Strengthening;Both;5 reps   5 sec hold noodle along spine      Shoulder Exercises: ROM/Strengthening   Pendulum sitting circles; horizontal ab/add x 5-10 reps each       Shoulder Exercises: Isometric Strengthening   External Rotation 5X5"   sitting PT providing resistance    ABduction 5X5"   pt sitting PT providing resistance      Manual Therapy   Manual therapy comments pt supine Rt UE supported on pillow     Myofascial Release working through anterior shoulder     Passive ROM Rt shoulder flexion 90 deg; abd 60 seg; ER 30 deg pt supine     Other Manual Therapy instructed in and demonstrated scar massage for incision at anterior Rt shoulder                   PT Education - 10/14/19 1202    Education Details HEP POC    Person(s) Educated Patient    Methods Explanation;Demonstration;Tactile cues;Verbal cues;Handout    Comprehension Verbalized understanding;Returned demonstration;Verbal cues required;Tactile cues required            PT Short Term Goals - 10/14/19 1213      PT SHORT TERM GOAL #1   Title Independent in initial HEP    Baseline -    Time 4    Period Weeks    Status New    Target Date 11/11/19      PT SHORT  TERM GOAL #2   Title Patient demonstrates and verbalizes post op precautions to protect TSA    Baseline -    Time 4    Period Weeks    Status New    Target Date 11/11/19      PT SHORT TERM GOAL #3   Title Patient demonstrates and verbalizes appropriate positions and postures to support Rt UE and facilitate healing    Baseline -    Time 4    Period Weeks    Status Achieved    Target Date 11/11/19      PT SHORT TERM GOAL #4   Title Patient reports modification of sitting positions for home    Baseline -    Time 4    Period Weeks    Status New    Target Date 11/11/19             PT Long Term Goals - 10/14/19 1217      PT LONG TERM GOAL #1   Title Improve Rt shoulder ROM to allowed motion per MD protocol    Baseline -    Time 10    Period Weeks    Status New    Target Date 12/23/19      PT LONG TERM GOAL #2   Title Functional strength Rt UE to allow patient to reach overhead and use Rt UE for functional activities  Baseline -    Time 10    Period Weeks    Status New    Target Date 12/23/19      PT LONG TERM GOAL #4   Title Independent in HEP    Baseline -    Time 10    Period Weeks    Status New    Target Date 12/23/19      PT LONG TERM GOAL #5   Title Improve FOTO to </= 29% limitation    Time 10    Period Weeks    Status New    Target Date 12/23/19                  Plan - 10/14/19 1209    Clinical Impression Statement Patient presents s/p Rt reverse TSA 9/37/34 with no complications post surgically. Patient has a prior orthopedic surgeries and joint replacements as well as peripherial neuropathy bilat LE's. He has limited ROM; strength; function Rt UE. Patient will benefit from PT to address problems identified and progress rehab per MD protocol    Stability/Clinical Decision Making Stable/Uncomplicated    Clinical Decision Making Low    Rehab Potential Good    PT Frequency 2x / week    PT Duration Other (comment)   10 weeks   PT  Treatment/Interventions ADLs/Self Care Home Management;Aquatic Therapy;Cryotherapy;Electrical Stimulation;Iontophoresis 4mg /ml Dexamethasone;Moist Heat;Ultrasound;Therapeutic activities;Therapeutic exercise;Balance training;Neuromuscular re-education;Patient/family education;Manual techniques;Passive range of motion;Dry needling;Taping;Vasopneumatic Device    PT Next Visit Plan review HEP; progress rehab per MD protocol; manual and PROM; modalities as indicated    PT [place] and Agree with Plan of Care Patient           Patient will benefit from skilled therapeutic intervention in order to improve the following deficits and impairments:  Decreased range of motion, Increased fascial restricitons, Obesity, Impaired flexibility, Improper body mechanics, Postural dysfunction, Impaired sensation, Increased edema, Decreased strength, Decreased mobility, Pain, Decreased activity tolerance, Decreased balance, Decreased endurance, Impaired UE functional use, Abnormal gait  Visit Diagnosis: Chronic right shoulder pain - Plan: PT plan of care cert/re-cert  Other symptoms and signs involving the musculoskeletal system - Plan: PT plan of care cert/re-cert  Muscle weakness (generalized) - Plan: PT plan of care cert/re-cert  Stiffness of right shoulder, not elsewhere classified - Plan: PT plan of care cert/re-cert     Problem List Patient Active Problem List   Diagnosis Date Noted   Secondary hypercoagulable state ([place]) 05/20/2019   Pseudogout of knee 01/27/2019   Benign paroxysmal positional vertigo 10/21/2018   Closed dislocation of left hip (HCC) 10/03/2018   Cough 09/16/2018   Paroxysmal atrial fibrillation ([facility]) 07/04/2018   Spontaneous dissection of coronary artery 06/22/2018   Ischemic cardiomyopathy 06/22/2018   Near syncope 06/20/2018   Tachycardia 06/20/2018   Nausea 06/20/2018   PVC (premature ventricular contraction) 06/20/2018    History of DVT (deep vein thrombosis) 06/20/2018   NSTEMI (non-ST elevated myocardial infarction) ([facility]) 06/20/2018   Onychomycosis 06/19/2018   Venous stasis dermatitis of both lower extremities 06/19/2018   Insomnia 06/19/2018   Osteoarthritis of right glenohumeral joint 06/17/2018   Rotator cuff arthropathy of left shoulder 06/17/2018   History of bilateral hip arthroplasty 03/27/2018   Avascular necrosis of bone of hip ([facility]) 03/27/2018   Recurrent deep vein thrombosis (DVT) ([facility]) 02/12/2018   Health counseling 02/12/2018   Hyperhomocystinemia ([place]) 01/10/2018   Cellulitis of right foot 01/08/2018   Intermittent claudication ([facility]) 12/04/2017  Fissure in skin 12/04/2017   Acute deep vein thrombosis (DVT) of calf muscle vein of right lower extremity ([facility]) 11/26/2017   Acute deep vein thrombosis of lower limb ([facility]) 11/26/2017   Primary osteoarthritis of first carpometacarpal joint of left hand 11/21/2017   Hand cramps 10/25/2017   High frequency hearing loss, left 09/10/2017   Degeneration of thoracic intervertebral disc 05/30/2017   Mixed hyperlipidemia 04/18/2017   Increased creatine kinase level 04/18/2017   Scoliosis of thoracolumbar spine 06/23/2016   Anxiety, generalized 05/11/2016   Generalized anxiety disorder 05/11/2016   Benign essential hypertension 03/26/2014   Spinal stenosis, lumbar region, with neurogenic claudication 12/21/2013   Morbid obesity ([facility]) 08/01/2012   History of bilateral knee arthroplasty with CPPD 01/26/2012   Erectile dysfunction 01/26/2012   Annual physical exam 01/26/2012   Impotence 01/26/2012    [place] Nilda Simmer PT, MPH  10/14/2019, 12:22 PM  [facility] [place], [place], [address] Phone: [phone]   Fax:  [phone]  Name: MAK BONNY MRN: [id number] Date of Birth: 1960/10/04

## 2019-10-16 ENCOUNTER — Ambulatory Visit (INDEPENDENT_AMBULATORY_CARE_PROVIDER_SITE_OTHER): Payer: 59 | Admitting: Physical Therapy

## 2019-10-16 ENCOUNTER — Other Ambulatory Visit: Payer: Self-pay

## 2019-10-16 DIAGNOSIS — M25511 Pain in right shoulder: Secondary | ICD-10-CM | POA: Diagnosis not present

## 2019-10-16 DIAGNOSIS — R601 Generalized edema: Secondary | ICD-10-CM | POA: Diagnosis not present

## 2019-10-16 DIAGNOSIS — R29898 Other symptoms and signs involving the musculoskeletal system: Secondary | ICD-10-CM | POA: Diagnosis not present

## 2019-10-16 DIAGNOSIS — M25611 Stiffness of right shoulder, not elsewhere classified: Secondary | ICD-10-CM

## 2019-10-16 DIAGNOSIS — M6281 Muscle weakness (generalized): Secondary | ICD-10-CM | POA: Diagnosis not present

## 2019-10-16 DIAGNOSIS — G8929 Other chronic pain: Secondary | ICD-10-CM

## 2019-10-16 MED FILL — ZOLPIDEM TARTRATE 10 MG TAB: 10 | 30 days supply | Qty: 30 | Fill #1

## 2019-10-16 NOTE — Therapy (Signed)
[place], [place], [address] Phone: [phone]   Fax:  [phone]  Physical Therapy Treatment  Patient Details  Name: Travis Huff MRN: [id number] Date of Birth: 15-May-1960 Referring Provider (PT): Dr Justice Britain    Encounter Date: 10/16/2019   PT End of Session - 10/16/19 1109    Visit Number 2    Number of Visits 20    Date for PT Re-Evaluation 12/23/19    PT Start Time 1103    PT Stop Time 1153    PT Time Calculation (min) 50 min    Activity Tolerance Patient tolerated treatment well    Behavior During Therapy [facility] for tasks assessed/performed           Past Medical History:  Diagnosis Date  . Arrhythmia   . Arthritis   . Asthma    as child  . Avascular necrosis of bone of left hip ([place])   . Back pain   . Blood clot in vein 06/2016   x 2 no bleeding disorders found after back surgery  . Complication of anesthesia    ketamine hallucinations  . Coronary artery disease   . Dislocated hip, left, initial encounter ([place]) 10/03/2018  . Dysrhythmia 2020   a-fib  . Hypertension   . Neuromuscular disorder ([place])    neuropathy bi lat legs knee down  . PAF (paroxysmal atrial fibrillation) ([place])   . Viral pericarditis 20 yrs ago    Past Surgical History:  Procedure Laterality Date  . ABDOMINAL EXPOSURE N/A 06/23/2016   Procedure: ABDOMINAL EXPOSURE;  Surgeon: Rosetta Posner, MD;  Location: [place];  Service: Vascular;  Laterality: N/A;  . ANTERIOR LATERAL LUMBAR FUSION 4 LEVELS Right 06/23/2016   Procedure: Right  Lumbar two-three Lumbar three-four Lumbar four-five Anterolateral lumbar interbody fusion;  Surgeon: Erline Levine, MD;  Location: [place];  Service: Neurosurgery;  Laterality: Right;  . ANTERIOR LUMBAR FUSION N/A 06/23/2016   Procedure: Lumbar five -sacral one  Anterior lumbar interbody fusion with Dr. Sherren Mocha Early for approach;  Surgeon: Erline Levine, MD;  Location: [place];   Service: Neurosurgery;  Laterality: N/A;  . APPLICATION OF INTRAOPERATIVE CT SCAN N/A 06/26/2016   Procedure: APPLICATION OF INTRAOPERATIVE CT SCAN;  Surgeon: Erline Levine, MD;  Location: [place];  Service: Neurosurgery;  Laterality: N/A;  . BACK SURGERY    . CARDIAC CATHETERIZATION    . CARDIOVASCULAR STRESS TEST     Negative in May 2014  . COLONOSCOPY    . HERNIA REPAIR     umbilical  . HIP CLOSED REDUCTION Left 10/03/2018   Procedure: CLOSED REDUCTION HIP;  Surgeon: Rod Can, MD;  [facility];  Service: Orthopedics;  Laterality: Left;  . IR IVC FILTER PLMT / S&I Burke Keels GUID/MOD SED  03/26/2018  . IR RADIOLOGIST EVAL & MGMT  04/16/2019  . JOINT REPLACEMENT    . LEFT HEART CATH AND CORONARY ANGIOGRAPHY N/A 06/21/2018   Procedure: LEFT HEART CATH AND CORONARY ANGIOGRAPHY;  Surgeon: Jettie Booze, MD;  Location: [place] [facility];  Service: Cardiovascular;  Laterality: N/A;  . POSTERIOR LUMBAR FUSION 4 LEVEL N/A 06/26/2016   Procedure: Thoracic ten to Pelvis fixation with AIRO;  Surgeon: Erline Levine, MD;  Location: [place];  Service: Neurosurgery;  Laterality: N/A;  . REVERSE SHOULDER ARTHROPLASTY Right 09/18/2019   Procedure: REVERSE SHOULDER ARTHROPLASTY;  Surgeon: Justice Britain, MD;  [facility];  Service: Orthopedics;  Laterality: Right;  137min  .  TENDON REPAIR     right hand  . TOTAL HIP ARTHROPLASTY Right 05/25/2014   Procedure: RIGHT TOTAL HIP ARTHROPLASTY ANTERIOR APPROACH;  Surgeon: Rod Can, MD;  Location: [place];  Service: Orthopedics;  Laterality: Right;  . TOTAL HIP ARTHROPLASTY Left 03/27/2018   Procedure: TOTAL HIP ARTHROPLASTY ANTERIOR APPROACH- DA complex;  Surgeon: Rod Can, MD;  [facility];  Service: Orthopedics;  Laterality: Left;  . TOTAL KNEE ARTHROPLASTY Bilateral     There were no vitals filed for this visit.   Subjective Assessment - 10/16/19 1111    Subjective Pt reports his Lt shoulder "is killing" him.  His Rt shoulder has  been feeling pretty good. No new changes.    Patient Stated Goals use Rt arm for functional activities without irritating anything    Currently in Pain? No/denies    Pain Score 0-No pain              [facility] PT Assessment - 10/16/19 0001      Assessment   Medical Diagnosis Rt Reverse TSA    Referring Provider (PT) Dr Justice Britain     Onset Date/Surgical Date 09/18/19    Hand Dominance Right    Next MD Visit 10/29/19    Prior Therapy yes for knees; back; BPPV            [facility] Adult PT Treatment/Exercise - 10/16/19 0001      Shoulder Exercises: Seated   Retraction AROM;Strengthening;Both;5 reps   5 sec hold noodle along spine    Other Seated Exercises (per protocol) upper cut with RUE (limited range) x 3 sec x 5 reps, hitch hike x 5 reps (limited range)     Other Seated Exercises shoulder rolls x 10       Shoulder Exercises: Standing   Other Standing Exercises RUE pendulum circles x 20      Shoulder Exercises: Pulleys   Flexion --   10 reps x 10 sec    Flexion Limitations within protocol limits     Scaption --   10 reps x 10 sec    Scaption Limitations within protocol limits       Shoulder Exercises: Isometric Strengthening   Flexion 5X5"   therapist provides resistance   External Rotation 5X5"   sitting, therapist provides resistance   ABduction 5X5"   sitting, therapist provides resistance   ABduction Limitations cues for form.     ADduction 5X5"      Modalities   Modalities Vasopneumatic      Vasopneumatic   Number Minutes Vasopneumatic  10 minutes    Vasopnuematic Location  Shoulder   RUE   Vasopneumatic Pressure Low    Vasopneumatic Temperature  34 deg       Manual Therapy   Manual therapy comments I strip of reg Rock tape applied in zig zag pattern for scar management     Passive ROM Rt shoulder flexion 90 deg; abd 60 seg; ER 30 deg pt supine                     PT Short Term Goals - 10/14/19 1213      PT SHORT TERM GOAL #1   Title  Independent in initial HEP    Baseline -    Time 4    Period Weeks    Status New    Target Date 11/11/19      PT SHORT TERM GOAL #2   Title Patient demonstrates and verbalizes  post op precautions to protect TSA    Baseline -    Time 4    Period Weeks    Status New    Target Date 11/11/19      PT SHORT TERM GOAL #3   Title Patient demonstrates and verbalizes appropriate positions and postures to support Rt UE and facilitate healing    Baseline -    Time 4    Period Weeks    Status Achieved    Target Date 11/11/19      PT SHORT TERM GOAL #4   Title Patient reports modification of sitting positions for home    Baseline -    Time 4    Period Weeks    Status New    Target Date 11/11/19             PT Long Term Goals - 10/14/19 1217      PT LONG TERM GOAL #1   Title Improve Rt shoulder ROM to allowed motion per MD protocol    Baseline -    Time 10    Period Weeks    Status New    Target Date 12/23/19      PT LONG TERM GOAL #2   Title Functional strength Rt UE to allow patient to reach overhead and use Rt UE for functional activities    Baseline -    Time 10    Period Weeks    Status New    Target Date 12/23/19      PT LONG TERM GOAL #4   Title Independent in HEP    Baseline -    Time 10    Period Weeks    Status New    Target Date 12/23/19      PT LONG TERM GOAL #5   Title Improve FOTO to </= 29% limitation    Time 10    Period Weeks    Status New    Target Date 12/23/19                 Plan - 10/16/19 1317    Clinical Impression Statement Pt tolerated exercises well, without increase in pain and within ROM restrictions of protocol.  Progressing towards goals.    Stability/Clinical Decision Making Stable/Uncomplicated    Rehab Potential Good    PT Frequency 2x / week    PT Duration Other (comment)   10 weeks   PT Treatment/Interventions ADLs/Self Care Home Management;Aquatic Therapy;Cryotherapy;Electrical Stimulation;Iontophoresis 4mg /ml  Dexamethasone;Moist Heat;Ultrasound;Therapeutic activities;Therapeutic exercise;Balance training;Neuromuscular re-education;Patient/family education;Manual techniques;Passive range of motion;Dry needling;Taping;Vasopneumatic Device    PT Next Visit Plan review HEP; progress rehab per MD protocol; manual and PROM; modalities as indicated.  Assess response to tape.    PT Home Exercise Plan [id number]    Consulted and Agree with Plan of Care Patient           Patient will benefit from skilled therapeutic intervention in order to improve the following deficits and impairments:  Decreased range of motion, Increased fascial restricitons, Obesity, Impaired flexibility, Improper body mechanics, Postural dysfunction, Impaired sensation, Increased edema, Decreased strength, Decreased mobility, Pain, Decreased activity tolerance, Decreased balance, Decreased endurance, Impaired UE functional use, Abnormal gait  Visit Diagnosis: Chronic right shoulder pain  Other symptoms and signs involving the musculoskeletal system  Muscle weakness (generalized)  Stiffness of right shoulder, not elsewhere classified  Generalized edema     Problem List Patient Active Problem List   Diagnosis Date Noted  . Secondary hypercoagulable state ([place])  05/20/2019  . Pseudogout of knee 01/27/2019  . Benign paroxysmal positional vertigo 10/21/2018  . Closed dislocation of left hip ([place]) 10/03/2018  . Cough 09/16/2018  . Paroxysmal atrial fibrillation ([place]) 07/04/2018  . Spontaneous dissection of coronary artery 06/22/2018  . Ischemic cardiomyopathy 06/22/2018  . Near syncope 06/20/2018  . Tachycardia 06/20/2018  . Nausea 06/20/2018  . PVC (premature ventricular contraction) 06/20/2018  . History of DVT (deep vein thrombosis) 06/20/2018  . NSTEMI (non-ST elevated myocardial infarction) ([place]) 06/20/2018  . Onychomycosis 06/19/2018  . Venous stasis dermatitis of both lower extremities 06/19/2018  . Insomnia  06/19/2018  . Osteoarthritis of right glenohumeral joint 06/17/2018  . Rotator cuff arthropathy of left shoulder 06/17/2018  . History of bilateral hip arthroplasty 03/27/2018  . Avascular necrosis of bone of hip ([place]) 03/27/2018  . Recurrent deep vein thrombosis (DVT) ([place]) 02/12/2018  . Health counseling 02/12/2018  . Hyperhomocystinemia ([place]) 01/10/2018  . Cellulitis of right foot 01/08/2018  . Intermittent claudication ([place]) 12/04/2017  . Fissure in skin 12/04/2017  . Acute deep vein thrombosis (DVT) of calf muscle vein of right lower extremity ([place]) 11/26/2017  . Acute deep vein thrombosis of lower limb ([place]) 11/26/2017  . Primary osteoarthritis of first carpometacarpal joint of left hand 11/21/2017  . Hand cramps 10/25/2017  . High frequency hearing loss, left 09/10/2017  . Degeneration of thoracic intervertebral disc 05/30/2017  . Mixed hyperlipidemia 04/18/2017  . Increased creatine kinase level 04/18/2017  . Scoliosis of thoracolumbar spine 06/23/2016  . Anxiety, generalized 05/11/2016  . Generalized anxiety disorder 05/11/2016  . Benign essential hypertension 03/26/2014  . Spinal stenosis, lumbar region, with neurogenic claudication 12/21/2013  . Morbid obesity ([place]) 08/01/2012  . History of bilateral knee arthroplasty with CPPD 01/26/2012  . Erectile dysfunction 01/26/2012  . Annual physical exam 01/26/2012  . Impotence 01/26/2012   [name] Perna, PTA 10/16/19 1:18 PM  [place], [place], [address] Phone: [phone]   Fax:  [phone]  Name: USAMA HARKLESS MRN: [id number] Date of Birth: 17-Feb-1960

## 2019-10-17 ENCOUNTER — Other Ambulatory Visit (HOSPITAL_BASED_OUTPATIENT_CLINIC_OR_DEPARTMENT_OTHER): Payer: Self-pay | Admitting: Internal Medicine

## 2019-10-17 MED FILL — FLUARIX QUADRIVALENT 0.5 ML: 0.5 | 1 days supply | Qty: 1 | Fill #0

## 2019-10-20 ENCOUNTER — Ambulatory Visit (INDEPENDENT_AMBULATORY_CARE_PROVIDER_SITE_OTHER): Payer: 59 | Admitting: Rehabilitative and Restorative Service Providers"

## 2019-10-20 ENCOUNTER — Encounter: Payer: Self-pay | Admitting: Rehabilitative and Restorative Service Providers"

## 2019-10-20 ENCOUNTER — Other Ambulatory Visit: Payer: Self-pay

## 2019-10-20 DIAGNOSIS — M25611 Stiffness of right shoulder, not elsewhere classified: Secondary | ICD-10-CM

## 2019-10-20 DIAGNOSIS — M25511 Pain in right shoulder: Secondary | ICD-10-CM

## 2019-10-20 DIAGNOSIS — G8929 Other chronic pain: Secondary | ICD-10-CM | POA: Diagnosis not present

## 2019-10-20 DIAGNOSIS — R29898 Other symptoms and signs involving the musculoskeletal system: Secondary | ICD-10-CM

## 2019-10-20 DIAGNOSIS — R601 Generalized edema: Secondary | ICD-10-CM | POA: Diagnosis not present

## 2019-10-20 DIAGNOSIS — M6281 Muscle weakness (generalized): Secondary | ICD-10-CM | POA: Diagnosis not present

## 2019-10-20 NOTE — Therapy (Signed)
[place], [place], [address] Phone: [phone]   Fax:  [phone]  Physical Therapy Treatment  Patient Details  Name: Travis Huff MRN: [id number] Date of Birth: May 07, 1960 Referring Provider (PT): Dr Justice Britain    Encounter Date: 10/20/2019   PT End of Session - 10/20/19 1103    Visit Number 3    Number of Visits 20    Date for PT Re-Evaluation 12/23/19    PT Start Time 1100    PT Stop Time 1150    PT Time Calculation (min) 50 min    Activity Tolerance Patient tolerated treatment well           Past Medical History:  Diagnosis Date  . Arrhythmia   . Arthritis   . Asthma    as child  . Avascular necrosis of bone of left hip ([place])   . Back pain   . Blood clot in vein 06/2016   x 2 no bleeding disorders found after back surgery  . Complication of anesthesia    ketamine hallucinations  . Coronary artery disease   . Dislocated hip, left, initial encounter ([place]) 10/03/2018  . Dysrhythmia 2020   a-fib  . Hypertension   . Neuromuscular disorder ([place])    neuropathy bi lat legs knee down  . PAF (paroxysmal atrial fibrillation) ([place])   . Viral pericarditis 20 yrs ago    Past Surgical History:  Procedure Laterality Date  . ABDOMINAL EXPOSURE N/A 06/23/2016   Procedure: ABDOMINAL EXPOSURE;  Surgeon: Rosetta Posner, MD;  Location: [place];  Service: Vascular;  Laterality: N/A;  . ANTERIOR LATERAL LUMBAR FUSION 4 LEVELS Right 06/23/2016   Procedure: Right  Lumbar two-three Lumbar three-four Lumbar four-five Anterolateral lumbar interbody fusion;  Surgeon: Erline Levine, MD;  Location: [place];  Service: Neurosurgery;  Laterality: Right;  . ANTERIOR LUMBAR FUSION N/A 06/23/2016   Procedure: Lumbar five -sacral one  Anterior lumbar interbody fusion with Dr. Sherren Mocha Early for approach;  Surgeon: Erline Levine, MD;  Location: [place];  Service: Neurosurgery;  Laterality: N/A;  . APPLICATION OF  INTRAOPERATIVE CT SCAN N/A 06/26/2016   Procedure: APPLICATION OF INTRAOPERATIVE CT SCAN;  Surgeon: Erline Levine, MD;  Location: [place];  Service: Neurosurgery;  Laterality: N/A;  . BACK SURGERY    . CARDIAC CATHETERIZATION    . CARDIOVASCULAR STRESS TEST     Negative in May 2014  . COLONOSCOPY    . HERNIA REPAIR     umbilical  . HIP CLOSED REDUCTION Left 10/03/2018   Procedure: CLOSED REDUCTION HIP;  Surgeon: Rod Can, MD;  [facility];  Service: Orthopedics;  Laterality: Left;  . IR IVC FILTER PLMT / S&I Burke Keels GUID/MOD SED  03/26/2018  . IR RADIOLOGIST EVAL & MGMT  04/16/2019  . JOINT REPLACEMENT    . LEFT HEART CATH AND CORONARY ANGIOGRAPHY N/A 06/21/2018   Procedure: LEFT HEART CATH AND CORONARY ANGIOGRAPHY;  Surgeon: Jettie Booze, MD;  Location: [place] [facility];  Service: Cardiovascular;  Laterality: N/A;  . POSTERIOR LUMBAR FUSION 4 LEVEL N/A 06/26/2016   Procedure: Thoracic ten to Pelvis fixation with AIRO;  Surgeon: Erline Levine, MD;  Location: [place];  Service: Neurosurgery;  Laterality: N/A;  . REVERSE SHOULDER ARTHROPLASTY Right 09/18/2019   Procedure: REVERSE SHOULDER ARTHROPLASTY;  Surgeon: Justice Britain, MD;  [facility];  Service: Orthopedics;  Laterality: Right;  119min  . TENDON REPAIR     right hand  .  TOTAL HIP ARTHROPLASTY Right 05/25/2014   Procedure: RIGHT TOTAL HIP ARTHROPLASTY ANTERIOR APPROACH;  Surgeon: Rod Can, MD;  Location: [place];  Service: Orthopedics;  Laterality: Right;  . TOTAL HIP ARTHROPLASTY Left 03/27/2018   Procedure: TOTAL HIP ARTHROPLASTY ANTERIOR APPROACH- DA complex;  Surgeon: Rod Can, MD;  [facility];  Service: Orthopedics;  Laterality: Left;  . TOTAL KNEE ARTHROPLASTY Bilateral     There were no vitals filed for this visit.   Subjective Assessment - 10/20/19 1138    Subjective Increased pain in mid to low back as well as shoulder from bending over to pick up a pill on the floor last night.    Currently  in Pain? Yes    Pain Location Shoulder    Pain Descriptors / Indicators Aching                             [facility] Adult PT Treatment/Exercise - 10/20/19 0001      Shoulder Exercises: Seated   Retraction AROM;Strengthening;Both;5 reps   5 sec hold noodle along spine    Other Seated Exercises shoulder rolls x 10       Shoulder Exercises: Pulleys   Flexion --   10 reps x 10 sec    Flexion Limitations flexion to 90 deg    Scaption --   10 reps x 10 sec    Scaption Limitations abduction to 60 deg       Shoulder Exercises: Isometric Strengthening   Flexion 5X5"   therapist provides resistance   External Rotation 5X5"   sitting, therapist provides resistance   ABduction 5X5"   sitting, therapist provides resistance   ADduction 5X5"      Shoulder Exercises: Stretch   External Rotation Stretch 5 reps;10 seconds   seated with cane to 30 deg      Modalities   Modalities Vasopneumatic      Moist Heat Therapy   Number Minutes Moist Heat 15 Minutes   during treatment   Moist Heat Location Lumbar Spine      Vasopneumatic   Number Minutes Vasopneumatic  10 minutes    Vasopnuematic Location  Shoulder   RUE   Vasopneumatic Pressure Low    Vasopneumatic Temperature  34 deg       Manual Therapy   Manual therapy comments I strip of reg Rock tape applied in zig zag pattern for scar management     Joint Mobilization gentle distraction and mob for Rt GH joint w/ pillow case     Soft tissue mobilization IASTM anterior and middle deltoid/shoulder    Myofascial Release anterior shoulder     Passive ROM Rt shoulder flexion 90 deg; abd 60 seg; ER 30 deg pt supine                     PT Short Term Goals - 10/14/19 1213      PT SHORT TERM GOAL #1   Title Independent in initial HEP    Baseline -    Time 4    Period Weeks    Status New    Target Date 11/11/19      PT SHORT TERM GOAL #2   Title Patient demonstrates and verbalizes post op precautions to protect TSA     Baseline -    Time 4    Period Weeks    Status New    Target Date 11/11/19  PT SHORT TERM GOAL #3   Title Patient demonstrates and verbalizes appropriate positions and postures to support Rt UE and facilitate healing    Baseline -    Time 4    Period Weeks    Status Achieved    Target Date 11/11/19      PT SHORT TERM GOAL #4   Title Patient reports modification of sitting positions for home    Baseline -    Time 4    Period Weeks    Status New    Target Date 11/11/19             PT Long Term Goals - 10/14/19 1217      PT LONG TERM GOAL #1   Title Improve Rt shoulder ROM to allowed motion per MD protocol    Baseline -    Time 10    Period Weeks    Status New    Target Date 12/23/19      PT LONG TERM GOAL #2   Title Functional strength Rt UE to allow patient to reach overhead and use Rt UE for functional activities    Baseline -    Time 10    Period Weeks    Status New    Target Date 12/23/19      PT LONG TERM GOAL #4   Title Independent in HEP    Baseline -    Time 10    Period Weeks    Status New    Target Date 12/23/19      PT LONG TERM GOAL #5   Title Improve FOTO to </= 29% limitation    Time 10    Period Weeks    Status New    Target Date 12/23/19                 Plan - 10/20/19 1139    Clinical Impression Statement Some increased pain in the Rt shoulder today from bending forward to pick up a pill from the floor last night. Patient reports good response to taping for scar management. Good PROM within protocol.    Rehab Potential Good    PT Frequency 2x / week    PT Duration Other (comment)    PT Treatment/Interventions ADLs/Self Care Home Management;Aquatic Therapy;Cryotherapy;Electrical Stimulation;Iontophoresis 4mg /ml Dexamethasone;Moist Heat;Ultrasound;Therapeutic activities;Therapeutic exercise;Balance training;Neuromuscular re-education;Patient/family education;Manual techniques;Passive range of motion;Dry  needling;Taping;Vasopneumatic Device    PT Next Visit Plan review HEP; progress rehab per MD protocol; manual and PROM; modalities as indicated.  Continue tape as needed.    PT Home Exercise Plan [id number]    Consulted and Agree with Plan of Care Patient           Patient will benefit from skilled therapeutic intervention in order to improve the following deficits and impairments:     Visit Diagnosis: Chronic right shoulder pain  Other symptoms and signs involving the musculoskeletal system  Muscle weakness (generalized)  Stiffness of right shoulder, not elsewhere classified  Generalized edema     Problem List Patient Active Problem List   Diagnosis Date Noted  . Secondary hypercoagulable state ([place]) 05/20/2019  . Pseudogout of knee 01/27/2019  . Benign paroxysmal positional vertigo 10/21/2018  . Closed dislocation of left hip ([place]) 10/03/2018  . Cough 09/16/2018  . Paroxysmal atrial fibrillation ([place]) 07/04/2018  . Spontaneous dissection of coronary artery 06/22/2018  . Ischemic cardiomyopathy 06/22/2018  . Near syncope 06/20/2018  . Tachycardia 06/20/2018  . Nausea 06/20/2018  . PVC (premature ventricular contraction)  06/20/2018  . History of DVT (deep vein thrombosis) 06/20/2018  . NSTEMI (non-ST elevated myocardial infarction) ([place]) 06/20/2018  . Onychomycosis 06/19/2018  . Venous stasis dermatitis of both lower extremities 06/19/2018  . Insomnia 06/19/2018  . Osteoarthritis of right glenohumeral joint 06/17/2018  . Rotator cuff arthropathy of left shoulder 06/17/2018  . History of bilateral hip arthroplasty 03/27/2018  . Avascular necrosis of bone of hip ([place]) 03/27/2018  . Recurrent deep vein thrombosis (DVT) ([place]) 02/12/2018  . Health counseling 02/12/2018  . Hyperhomocystinemia ([place]) 01/10/2018  . Cellulitis of right foot 01/08/2018  . Intermittent claudication ([place]) 12/04/2017  . Fissure in skin 12/04/2017  . Acute deep vein thrombosis (DVT) of calf  muscle vein of right lower extremity ([place]) 11/26/2017  . Acute deep vein thrombosis of lower limb ([place]) 11/26/2017  . Primary osteoarthritis of first carpometacarpal joint of left hand 11/21/2017  . Hand cramps 10/25/2017  . High frequency hearing loss, left 09/10/2017  . Degeneration of thoracic intervertebral disc 05/30/2017  . Mixed hyperlipidemia 04/18/2017  . Increased creatine kinase level 04/18/2017  . Scoliosis of thoracolumbar spine 06/23/2016  . Anxiety, generalized 05/11/2016  . Generalized anxiety disorder 05/11/2016  . Benign essential hypertension 03/26/2014  . Spinal stenosis, lumbar region, with neurogenic claudication 12/21/2013  . Morbid obesity ([place]) 08/01/2012  . History of bilateral knee arthroplasty with CPPD 01/26/2012  . Erectile dysfunction 01/26/2012  . Annual physical exam 01/26/2012  . Impotence 01/26/2012    Ilina Xu Nilda Simmer PT, MPH  10/20/2019, 11:42 AM  [facility] [place], [place], [address] Phone: [phone]   Fax:  [phone]  Name: Travis Huff MRN: [id number] Date of Birth: [birth year]

## 2019-10-23 ENCOUNTER — Ambulatory Visit (INDEPENDENT_AMBULATORY_CARE_PROVIDER_SITE_OTHER): Payer: 59 | Admitting: Physical Therapy

## 2019-10-23 ENCOUNTER — Other Ambulatory Visit: Payer: Self-pay

## 2019-10-23 ENCOUNTER — Encounter: Payer: Self-pay | Admitting: Physical Therapy

## 2019-10-23 DIAGNOSIS — G8929 Other chronic pain: Secondary | ICD-10-CM | POA: Diagnosis not present

## 2019-10-23 DIAGNOSIS — M25511 Pain in right shoulder: Secondary | ICD-10-CM | POA: Diagnosis not present

## 2019-10-23 DIAGNOSIS — R601 Generalized edema: Secondary | ICD-10-CM

## 2019-10-23 DIAGNOSIS — R29898 Other symptoms and signs involving the musculoskeletal system: Secondary | ICD-10-CM

## 2019-10-23 DIAGNOSIS — M25611 Stiffness of right shoulder, not elsewhere classified: Secondary | ICD-10-CM | POA: Diagnosis not present

## 2019-10-23 DIAGNOSIS — M6281 Muscle weakness (generalized): Secondary | ICD-10-CM | POA: Diagnosis not present

## 2019-10-23 NOTE — Therapy (Signed)
[place], [place], [address] Phone: [phone]   Fax:  [phone]  Physical Therapy Treatment  Patient Details  Name: Travis Huff MRN: [id number] Date of Birth: 03/28/1960 Referring Provider (PT): Dr Justice Britain    Encounter Date: 10/23/2019   PT End of Session - 10/23/19 1101    Visit Number 4    Number of Visits 20    Date for PT Re-Evaluation 12/23/19    PT Start Time 1059    PT Stop Time 1150    PT Time Calculation (min) 51 min    Activity Tolerance Patient tolerated treatment well    Behavior During Therapy [facility] for tasks assessed/performed           Past Medical History:  Diagnosis Date   Arrhythmia    Arthritis    Asthma    as child   Avascular necrosis of bone of left hip (HCC)    Back pain    Blood clot in vein 06/2016   x 2 no bleeding disorders found after back surgery   Complication of anesthesia    ketamine hallucinations   Coronary artery disease    Dislocated hip, left, initial encounter ([place]) 10/03/2018   Dysrhythmia 2020   a-fib   Hypertension    Neuromuscular disorder (HCC)    neuropathy bi lat legs knee down   PAF (paroxysmal atrial fibrillation) ([place])    Viral pericarditis 20 yrs ago    Past Surgical History:  Procedure Laterality Date   ABDOMINAL EXPOSURE N/A 06/23/2016   Procedure: ABDOMINAL EXPOSURE;  Surgeon: Rosetta Posner, MD;  Location: [place];  Service: Vascular;  Laterality: N/A;   ANTERIOR LATERAL LUMBAR FUSION 4 LEVELS Right 06/23/2016   Procedure: Right  Lumbar two-three Lumbar three-four Lumbar four-five Anterolateral lumbar interbody fusion;  Surgeon: Erline Levine, MD;  Location: [place];  Service: Neurosurgery;  Laterality: Right;   ANTERIOR LUMBAR FUSION N/A 06/23/2016   Procedure: Lumbar five -sacral one  Anterior lumbar interbody fusion with Dr. Sherren Mocha Early for approach;  Surgeon: Erline Levine, MD;  Location: [place];   Service: Neurosurgery;  Laterality: N/A;   APPLICATION OF INTRAOPERATIVE CT SCAN N/A 06/26/2016   Procedure: APPLICATION OF INTRAOPERATIVE CT SCAN;  Surgeon: Erline Levine, MD;  Location: [place];  Service: Neurosurgery;  Laterality: N/A;   BACK SURGERY     CARDIAC CATHETERIZATION     CARDIOVASCULAR STRESS TEST     Negative in May 2014   COLONOSCOPY     HERNIA REPAIR     umbilical   HIP CLOSED REDUCTION Left 10/03/2018   Procedure: CLOSED REDUCTION HIP;  Surgeon: Rod Can, MD;  [facility];  Service: Orthopedics;  Laterality: Left;   IR IVC FILTER PLMT / S&I /IMG GUID/MOD SED  03/26/2018   IR RADIOLOGIST EVAL & MGMT  04/16/2019   JOINT REPLACEMENT     LEFT HEART CATH AND CORONARY ANGIOGRAPHY N/A 06/21/2018   Procedure: LEFT HEART CATH AND CORONARY ANGIOGRAPHY;  Surgeon: Jettie Booze, MD;  Location: [place] [facility];  Service: Cardiovascular;  Laterality: N/A;   POSTERIOR LUMBAR FUSION 4 LEVEL N/A 06/26/2016   Procedure: Thoracic ten to Pelvis fixation with AIRO;  Surgeon: Erline Levine, MD;  Location: [place];  Service: Neurosurgery;  Laterality: N/A;   REVERSE SHOULDER ARTHROPLASTY Right 09/18/2019   Procedure: REVERSE SHOULDER ARTHROPLASTY;  Surgeon: Justice Britain, MD;  [facility];  Service: Orthopedics;  Laterality: Right;  151min  TENDON REPAIR     right hand   TOTAL HIP ARTHROPLASTY Right 05/25/2014   Procedure: RIGHT TOTAL HIP ARTHROPLASTY ANTERIOR APPROACH;  Surgeon: Rod Can, MD;  Location: [place];  Service: Orthopedics;  Laterality: Right;   TOTAL HIP ARTHROPLASTY Left 03/27/2018   Procedure: TOTAL HIP ARTHROPLASTY ANTERIOR APPROACH- DA complex;  Surgeon: Rod Can, MD;  [facility];  Service: Orthopedics;  Laterality: Left;   TOTAL KNEE ARTHROPLASTY Bilateral     There were no vitals filed for this visit.   Subjective Assessment - 10/23/19 1101    Subjective Pt reports his back pain has settled down, Rt shoulder doesn't  bother him much, the left shoulder is the one that hurts now    Patient Stated Goals use Rt arm for functional activities without irritating anything    Currently in Pain? Other (Comment)   one in Rt shoulder                            [facility] Adult PT Treatment/Exercise - 10/23/19 0001      Shoulder Exercises: Supine   External Rotation AAROM;15 reps    External Rotation Limitations with cane, Rt elbow at side, pt able to move into ~ 20 degrees ER.     Flexion AAROM;15 reps   using cane - limiting at ~ 115 degrees    Flexion Limitations AAROM Rt shoulder with cane press up    Other Supine Exercises 10x10sec shoulder presses.       Shoulder Exercises: Seated   Other Seated Exercises bicep curls with 1# tricep with red band elbow at side for both     Other Seated Exercises BWD shoulder rolls x 10, upper trap streches to each side       Shoulder Exercises: Pulleys   Flexion 2 minutes    Flexion Limitations slow progression towards 115 degrees - was able to get close to 100 degrees    Scaption 2 minutes    Scaption Limitations abduction to 60 deg  per protocol       Shoulder Exercises: Isometric Strengthening   Flexion 5X5"   supine - all resistance against therapists hand    Extension 5X5"    Extension Limitations supine - sub max     External Rotation 5X5"   supine, arm supported in neutral by side   External Rotation Limitations performed with arm 20 degrees IR, at neutral and 20 degrees ER     Internal Rotation 5X5"   same positions as written in ER    ABduction 5X5"   supine     Vasopneumatic   Number Minutes Vasopneumatic  15 minutes    Vasopnuematic Location  Shoulder    Vasopneumatic Pressure Low    Vasopneumatic Temperature  34 deg       Manual Therapy   Manual therapy comments I strip of reg Rock tape applied in zig zag pattern for scar management     Passive ROM Rt shoulder flexion, IR and scaption - pt able to move in line iwth protocol                      PT Short Term Goals - 10/14/19 1213      PT SHORT TERM GOAL #1   Title Independent in initial HEP    Baseline -    Time 4    Period Weeks    Status New    Target  Date 11/11/19      PT SHORT TERM GOAL #2   Title Patient demonstrates and verbalizes post op precautions to protect TSA    Baseline -    Time 4    Period Weeks    Status New    Target Date 11/11/19      PT SHORT TERM GOAL #3   Title Patient demonstrates and verbalizes appropriate positions and postures to support Rt UE and facilitate healing    Baseline -    Time 4    Period Weeks    Status Achieved    Target Date 11/11/19      PT SHORT TERM GOAL #4   Title Patient reports modification of sitting positions for home    Baseline -    Time 4    Period Weeks    Status New    Target Date 11/11/19             PT Long Term Goals - 10/14/19 1217      PT LONG TERM GOAL #1   Title Improve Rt shoulder ROM to allowed motion per MD protocol    Baseline -    Time 10    Period Weeks    Status New    Target Date 12/23/19      PT LONG TERM GOAL #2   Title Functional strength Rt UE to allow patient to reach overhead and use Rt UE for functional activities    Baseline -    Time 10    Period Weeks    Status New    Target Date 12/23/19      PT LONG TERM GOAL #4   Title Independent in HEP    Baseline -    Time 10    Period Weeks    Status New    Target Date 12/23/19      PT LONG TERM GOAL #5   Title Improve FOTO to </= 29% limitation    Time 10    Period Weeks    Status New    Target Date 12/23/19                 Plan - 10/23/19 1136    Clinical Impression Statement Travis Huff tolerates PROM very well, he is not resistant to movement of the arm in ROM permitted in the protocol.  He fatigues and has some soreness with isometrics - cued to press gently. These were all performed supine with therapist providing the back stop for the push.    Rehab Potential Good    PT Frequency 2x /  week    PT Treatment/Interventions ADLs/Self Care Home Management;Aquatic Therapy;Cryotherapy;Electrical Stimulation;Iontophoresis 4mg /ml Dexamethasone;Moist Heat;Ultrasound;Therapeutic activities;Therapeutic exercise;Balance training;Neuromuscular re-education;Patient/family education;Manual techniques;Passive range of motion;Dry needling;Taping;Vasopneumatic Device    PT Next Visit Plan progress rehab per MD protocol; manual and PROM; modalities as indicated.  Continue tape as needed.    PT Home Exercise Plan [id number]    Consulted and Agree with Plan of Care Patient           Patient will benefit from skilled therapeutic intervention in order to improve the following deficits and impairments:  Decreased range of motion, Increased fascial restricitons, Obesity, Impaired flexibility, Improper body mechanics, Postural dysfunction, Impaired sensation, Increased edema, Decreased strength, Decreased mobility, Pain, Decreased activity tolerance, Decreased balance, Decreased endurance, Impaired UE functional use, Abnormal gait  Visit Diagnosis: Chronic right shoulder pain  Other symptoms and signs involving the musculoskeletal system  Muscle weakness (generalized)  Stiffness  of right shoulder, not elsewhere classified  Generalized edema     Problem List Patient Active Problem List   Diagnosis Date Noted   Secondary hypercoagulable state ([place]) 05/20/2019   Pseudogout of knee 01/27/2019   Benign paroxysmal positional vertigo 10/21/2018   Closed dislocation of left hip ([place]) 10/03/2018   Cough 09/16/2018   Paroxysmal atrial fibrillation ([place]) 07/04/2018   Spontaneous dissection of coronary artery 06/22/2018   Ischemic cardiomyopathy 06/22/2018   Near syncope 06/20/2018   Tachycardia 06/20/2018   Nausea 06/20/2018   PVC (premature ventricular contraction) 06/20/2018   History of DVT (deep vein thrombosis) 06/20/2018   NSTEMI (non-ST elevated myocardial infarction) ([place])  06/20/2018   Onychomycosis 06/19/2018   Venous stasis dermatitis of both lower extremities 06/19/2018   Insomnia 06/19/2018   Osteoarthritis of right glenohumeral joint 06/17/2018   Rotator cuff arthropathy of left shoulder 06/17/2018   History of bilateral hip arthroplasty 03/27/2018   Avascular necrosis of bone of hip ([place]) 03/27/2018   Recurrent deep vein thrombosis (DVT) ([place]) 02/12/2018   Health counseling 02/12/2018   Hyperhomocystinemia ([place]) 01/10/2018   Cellulitis of right foot 01/08/2018   Intermittent claudication ([place]) 12/04/2017   Fissure in skin 12/04/2017   Acute deep vein thrombosis (DVT) of calf muscle vein of right lower extremity ([place]) 11/26/2017   Acute deep vein thrombosis of lower limb ([place]) 11/26/2017   Primary osteoarthritis of first carpometacarpal joint of left hand 11/21/2017   Hand cramps 10/25/2017   High frequency hearing loss, left 09/10/2017   Degeneration of thoracic intervertebral disc 05/30/2017   Mixed hyperlipidemia 04/18/2017   Increased creatine kinase level 04/18/2017   Scoliosis of thoracolumbar spine 06/23/2016   Anxiety, generalized 05/11/2016   Generalized anxiety disorder 05/11/2016   Benign essential hypertension 03/26/2014   Spinal stenosis, lumbar region, with neurogenic claudication 12/21/2013   Morbid obesity ([place]) 08/01/2012   History of bilateral knee arthroplasty with CPPD 01/26/2012   Erectile dysfunction 01/26/2012   Annual physical exam 01/26/2012   Impotence 01/26/2012    Travis Huff PT  10/23/2019, 11:39 AM  [facility] [place], [place], [address] Phone: [phone]   Fax:  [phone]  Name: Travis Huff MRN: [id number] Date of Birth: 01/30/1960

## 2019-10-27 ENCOUNTER — Encounter: Payer: 59 | Admitting: Rehabilitative and Restorative Service Providers"

## 2019-10-28 MED FILL — CARTIA XT 120 MG CP24: 120 | 30 days supply | Qty: 30 | Fill #2

## 2019-10-29 DIAGNOSIS — Z96611 Presence of right artificial shoulder joint: Secondary | ICD-10-CM | POA: Diagnosis not present

## 2019-10-29 DIAGNOSIS — M19012 Primary osteoarthritis, left shoulder: Secondary | ICD-10-CM | POA: Diagnosis not present

## 2019-10-29 DIAGNOSIS — Z471 Aftercare following joint replacement surgery: Secondary | ICD-10-CM | POA: Diagnosis not present

## 2019-10-30 ENCOUNTER — Encounter: Payer: Self-pay | Admitting: Physical Therapy

## 2019-10-30 ENCOUNTER — Ambulatory Visit (INDEPENDENT_AMBULATORY_CARE_PROVIDER_SITE_OTHER): Payer: 59 | Admitting: Physical Therapy

## 2019-10-30 ENCOUNTER — Other Ambulatory Visit: Payer: Self-pay

## 2019-10-30 DIAGNOSIS — R29898 Other symptoms and signs involving the musculoskeletal system: Secondary | ICD-10-CM

## 2019-10-30 DIAGNOSIS — R601 Generalized edema: Secondary | ICD-10-CM | POA: Diagnosis not present

## 2019-10-30 DIAGNOSIS — G8929 Other chronic pain: Secondary | ICD-10-CM | POA: Diagnosis not present

## 2019-10-30 DIAGNOSIS — M25611 Stiffness of right shoulder, not elsewhere classified: Secondary | ICD-10-CM | POA: Diagnosis not present

## 2019-10-30 DIAGNOSIS — M6281 Muscle weakness (generalized): Secondary | ICD-10-CM | POA: Diagnosis not present

## 2019-10-30 DIAGNOSIS — M25511 Pain in right shoulder: Secondary | ICD-10-CM

## 2019-10-30 NOTE — Therapy (Signed)
[place], [place], [address] Phone: [phone]   Fax:  [phone]  Physical Therapy Treatment  Patient Details  Name: Travis Huff MRN: [id number] Date of Birth: 20-Mar-1960 Referring Provider (PT): Dr Justice Britain    Encounter Date: 10/30/2019   PT End of Session - 10/30/19 1201    Visit Number 5    Number of Visits 20    Date for PT Re-Evaluation 12/23/19    PT Start Time 1149    PT Stop Time 1230    PT Time Calculation (min) 41 min    Activity Tolerance Patient tolerated treatment well    Behavior During Therapy [facility] for tasks assessed/performed           Past Medical History:  Diagnosis Date  . Arrhythmia   . Arthritis   . Asthma    as child  . Avascular necrosis of bone of left hip ([place])   . Back pain   . Blood clot in vein 06/2016   x 2 no bleeding disorders found after back surgery  . Complication of anesthesia    ketamine hallucinations  . Coronary artery disease   . Dislocated hip, left, initial encounter ([place]) 10/03/2018  . Dysrhythmia 2020   a-fib  . Hypertension   . Neuromuscular disorder ([place])    neuropathy bi lat legs knee down  . PAF (paroxysmal atrial fibrillation) ([place])   . Viral pericarditis 20 yrs ago    Past Surgical History:  Procedure Laterality Date  . ABDOMINAL EXPOSURE N/A 06/23/2016   Procedure: ABDOMINAL EXPOSURE;  Surgeon: Rosetta Posner, MD;  Location: [place];  Service: Vascular;  Laterality: N/A;  . ANTERIOR LATERAL LUMBAR FUSION 4 LEVELS Right 06/23/2016   Procedure: Right  Lumbar two-three Lumbar three-four Lumbar four-five Anterolateral lumbar interbody fusion;  Surgeon: Erline Levine, MD;  Location: [place];  Service: Neurosurgery;  Laterality: Right;  . ANTERIOR LUMBAR FUSION N/A 06/23/2016   Procedure: Lumbar five -sacral one  Anterior lumbar interbody fusion with Dr. Sherren Mocha Early for approach;  Surgeon: Erline Levine, MD;  Location: [place];   Service: Neurosurgery;  Laterality: N/A;  . APPLICATION OF INTRAOPERATIVE CT SCAN N/A 06/26/2016   Procedure: APPLICATION OF INTRAOPERATIVE CT SCAN;  Surgeon: Erline Levine, MD;  Location: [place];  Service: Neurosurgery;  Laterality: N/A;  . BACK SURGERY    . CARDIAC CATHETERIZATION    . CARDIOVASCULAR STRESS TEST     Negative in May 2014  . COLONOSCOPY    . HERNIA REPAIR     umbilical  . HIP CLOSED REDUCTION Left 10/03/2018   Procedure: CLOSED REDUCTION HIP;  Surgeon: Rod Can, MD;  [facility];  Service: Orthopedics;  Laterality: Left;  . IR IVC FILTER PLMT / S&I [place] GUID/MOD SED  03/26/2018  . IR RADIOLOGIST EVAL & MGMT  04/16/2019  . JOINT REPLACEMENT    . LEFT HEART CATH AND CORONARY ANGIOGRAPHY N/A 06/21/2018   Procedure: LEFT HEART CATH AND CORONARY ANGIOGRAPHY;  Surgeon: Jettie Booze, MD;  Location: [place] [facility];  Service: Cardiovascular;  Laterality: N/A;  . POSTERIOR LUMBAR FUSION 4 LEVEL N/A 06/26/2016   Procedure: Thoracic ten to Pelvis fixation with AIRO;  Surgeon: Erline Levine, MD;  Location: [place];  Service: Neurosurgery;  Laterality: N/A;  . REVERSE SHOULDER ARTHROPLASTY Right 09/18/2019   Procedure: REVERSE SHOULDER ARTHROPLASTY;  Surgeon: Justice Britain, MD;  [facility];  Service: Orthopedics;  Laterality: Right;  110mn  .  TENDON REPAIR     right hand  . TOTAL HIP ARTHROPLASTY Right 05/25/2014   Procedure: RIGHT TOTAL HIP ARTHROPLASTY ANTERIOR APPROACH;  Surgeon: Rod Can, MD;  Location: [place];  Service: Orthopedics;  Laterality: Right;  . TOTAL HIP ARTHROPLASTY Left 03/27/2018   Procedure: TOTAL HIP ARTHROPLASTY ANTERIOR APPROACH- DA complex;  Surgeon: Rod Can, MD;  [facility];  Service: Orthopedics;  Laterality: Left;  . TOTAL KNEE ARTHROPLASTY Bilateral     There were no vitals filed for this visit.   Subjective Assessment - 10/30/19 1241    Subjective Pt reports that his Lt shoulder is feeling better after injection at  doctor's office yesterday.  He had follow up with MD regarding Rt shoulder; pleased with progress.  He states he wasn't given any weight restrictions.  He voiced interest in dropping his frequency at this time (1x/wk).  He reports he is able to move his arm all around, even throwing ball to dog overhead.  he states the doctor recommended he toss the ball underhand.    Currently in Pain? No/denies    Pain Score 0-No pain              [facility] PT Assessment - 10/30/19 0001      Assessment   Medical Diagnosis Rt Reverse TSA    Referring Provider (PT) Dr Justice Britain     Onset Date/Surgical Date 09/18/19    Hand Dominance Right    Next MD Visit 11/26/19    Prior Therapy yes for knees; back; BPPV      PROM   Right Shoulder Flexion 120 Degrees   AAROM with pulley   Right Shoulder ABduction 90 Degrees   AAROM with pulley           [facility] Adult PT Treatment/Exercise - 10/30/19 0001      Shoulder Exercises: Seated   Row AROM;Both;10 reps   5 sec retraction, sliding cane on lap     Shoulder Exercises: Standing   Internal Rotation AAROM;Right;5 reps   cane behind back, gentle   Extension AAROM;Both;5 reps   cane behind back, gentle.    Extension Limitations standing close to chair in event of LOB.       Shoulder Exercises: Pulleys   Flexion 3 minutes    Scaption 2 minutes      Shoulder Exercises: Isometric Strengthening   Flexion 5X5"   seated - all resistance against therapists hand    Extension 5X5"    External Rotation 5X5"   supine, arm supported in neutral by side   ABduction 5X5"   supine     Shoulder Exercises: Stretch   External Rotation Stretch 5 reps;10 seconds   seated with cane to 30 deg    Table Stretch - Flexion 4 reps;10 seconds    Table Stretch - Abduction 4 reps;10 seconds   scaption     Vasopneumatic   Number Minutes Vasopneumatic  10 minutes    Vasopnuematic Location  Shoulder   RUE   Vasopneumatic Pressure Low    Vasopneumatic Temperature  34 deg                    PT Education - 10/30/19 1239    Education Details HEP updated    Person(s) Educated Patient    Methods Explanation;Handout    Comprehension Verbalized understanding            PT Short Term Goals - 10/30/19 1253      PT  SHORT TERM GOAL #1   Title Independent in initial HEP    Baseline -    Time 4    Period Weeks    Status Achieved    Target Date 11/11/19      PT SHORT TERM GOAL #2   Title Patient demonstrates and verbalizes post op precautions to protect TSA    Baseline -    Time 4    Period Weeks    Status Partially Met    Target Date 11/11/19      PT SHORT TERM GOAL #3   Title Patient demonstrates and verbalizes appropriate positions and postures to support Rt UE and facilitate healing    Baseline -    Time 4    Period Weeks    Status Partially Met    Target Date 11/11/19      PT SHORT TERM GOAL #4   Title Patient reports modification of sitting positions for home    Baseline -    Time 4    Period Weeks    Status Achieved    Target Date 11/11/19             PT Long Term Goals - 10/14/19 1217      PT LONG TERM GOAL #1   Title Improve Rt shoulder ROM to allowed motion per MD protocol    Baseline -    Time 10    Period Weeks    Status New    Target Date 12/23/19      PT LONG TERM GOAL #2   Title Functional strength Rt UE to allow patient to reach overhead and use Rt UE for functional activities    Baseline -    Time 10    Period Weeks    Status New    Target Date 12/23/19      PT LONG TERM GOAL #4   Title Independent in HEP    Baseline -    Time 10    Period Weeks    Status New    Target Date 12/23/19      PT LONG TERM GOAL #5   Title Improve FOTO to </= 29% limitation    Time 10    Period Weeks    Status New    Target Date 12/23/19                 Plan - 10/30/19 1244    Clinical Impression Statement Pt tolerating AAROM and isometrics well, without discomfort. Encouraged pt to not overdo it, and stay within  protocol.  Updated HEP according to protocol guidelines.  Will see pt at 8 wks, when pt is able to advance to the next phase.    Rehab Potential Good    PT Frequency 2x / week    PT Treatment/Interventions ADLs/Self Care Home Management;Aquatic Therapy;Cryotherapy;Electrical Stimulation;Iontophoresis 29m/ml Dexamethasone;Moist Heat;Ultrasound;Therapeutic activities;Therapeutic exercise;Balance training;Neuromuscular re-education;Patient/family education;Manual techniques;Passive range of motion;Dry needling;Taping;Vasopneumatic Device    PT Next Visit Plan progress rehab per MD protocol; modalities as indicated.    PT Home Exercise Plan [id number]    Consulted and Agree with Plan of Care Patient           Patient will benefit from skilled therapeutic intervention in order to improve the following deficits and impairments:  Decreased range of motion, Increased fascial restricitons, Obesity, Impaired flexibility, Improper body mechanics, Postural dysfunction, Impaired sensation, Increased edema, Decreased strength, Decreased mobility, Pain, Decreased activity tolerance, Decreased balance, Decreased endurance, Impaired UE functional use, Abnormal  gait  Visit Diagnosis: Chronic right shoulder pain  Other symptoms and signs involving the musculoskeletal system  Muscle weakness (generalized)  Stiffness of right shoulder, not elsewhere classified  Generalized edema     Problem List Patient Active Problem List   Diagnosis Date Noted  . Secondary hypercoagulable state ([place]) 05/20/2019  . Pseudogout of knee 01/27/2019  . Benign paroxysmal positional vertigo 10/21/2018  . Closed dislocation of left hip ([place]) 10/03/2018  . Cough 09/16/2018  . Paroxysmal atrial fibrillation ([place]) 07/04/2018  . Spontaneous dissection of coronary artery 06/22/2018  . Ischemic cardiomyopathy 06/22/2018  . Near syncope 06/20/2018  . Tachycardia 06/20/2018  . Nausea 06/20/2018  . PVC (premature ventricular  contraction) 06/20/2018  . History of DVT (deep vein thrombosis) 06/20/2018  . NSTEMI (non-ST elevated myocardial infarction) ([place]) 06/20/2018  . Onychomycosis 06/19/2018  . Venous stasis dermatitis of both lower extremities 06/19/2018  . Insomnia 06/19/2018  . Osteoarthritis of right glenohumeral joint 06/17/2018  . Rotator cuff arthropathy of left shoulder 06/17/2018  . History of bilateral hip arthroplasty 03/27/2018  . Avascular necrosis of bone of hip ([place]) 03/27/2018  . Recurrent deep vein thrombosis (DVT) ([place]) 02/12/2018  . Health counseling 02/12/2018  . Hyperhomocystinemia ([place]) 01/10/2018  . Cellulitis of right foot 01/08/2018  . Intermittent claudication ([place]) 12/04/2017  . Fissure in skin 12/04/2017  . Acute deep vein thrombosis (DVT) of calf muscle vein of right lower extremity ([place]) 11/26/2017  . Acute deep vein thrombosis of lower limb ([place]) 11/26/2017  . Primary osteoarthritis of first carpometacarpal joint of left hand 11/21/2017  . Hand cramps 10/25/2017  . High frequency hearing loss, left 09/10/2017  . Degeneration of thoracic intervertebral disc 05/30/2017  . Mixed hyperlipidemia 04/18/2017  . Increased creatine kinase level 04/18/2017  . Scoliosis of thoracolumbar spine 06/23/2016  . Anxiety, generalized 05/11/2016  . Generalized anxiety disorder 05/11/2016  . Benign essential hypertension 03/26/2014  . Spinal stenosis, lumbar region, with neurogenic claudication 12/21/2013  . Morbid obesity ([place]) 08/01/2012  . History of bilateral knee arthroplasty with CPPD 01/26/2012  . Erectile dysfunction 01/26/2012  . Annual physical exam 01/26/2012  . Impotence 01/26/2012   [name] Perna, PTA 10/30/19 12:54 PM  [place], [place], [address] Phone: [phone]   Fax:  [phone]  Name: Travis Huff MRN: [id number] Date of Birth: 07-Feb-1960

## 2019-10-30 NOTE — Patient Instructions (Signed)
Access Code: [email or web address] URL: [email or web address] Date: 10/30/2019 Prepared by: Munjor  Exercises .Seated Scapular Retraction - 2 x daily - 7 x weekly - 1-2 sets - 10 reps - 10 sec hold .Isometric Shoulder Abduction at Wall - 2 x daily - 7 x weekly - 1 sets - 5 reps - 5 sec hold .Isometric Shoulder External Rotation at Wall - 2 x daily - 7 x weekly - 1 sets - 5 reps - 5 sec hold .Isometric Shoulder Flexion at Wall - 2 x daily - 7 x weekly - 1 sets - 5-10 reps - 5 sec hold .Isometric Shoulder Extension at Wall - 2 x daily - 7 x weekly - 1 sets - 5-10 reps - 5 sec hold .Seated Shoulder Abduction Towel Slide at Table Top - 2 x daily - 7 x weekly - 1 sets - 5 reps - 10 hold .Seated Shoulder Flexion Towel Slide at Table Top Full Range of Motion - 2 x daily - 7 x weekly - 1 sets - 5 reps - 10 hold .Standing Shoulder Extension ROM with Dowel - 1 x daily - 7 x weekly - 1 sets - 10 reps .Standing Shoulder Internal Rotation AAROM with [place] - 1 x daily - 7 x weekly - 1 sets - 10 reps .Seated Shoulder External Rotation AAROM with Cane and Hand in Neutral - 1 x daily - 7 x weekly - 1 sets - 10 reps - 5-10 sec hold

## 2019-11-05 ENCOUNTER — Encounter (HOSPITAL_BASED_OUTPATIENT_CLINIC_OR_DEPARTMENT_OTHER): Payer: 59 | Admitting: Cardiology

## 2019-11-13 ENCOUNTER — Encounter: Payer: Self-pay | Admitting: Physical Therapy

## 2019-11-13 ENCOUNTER — Other Ambulatory Visit: Payer: Self-pay | Admitting: *Deleted

## 2019-11-13 ENCOUNTER — Ambulatory Visit (INDEPENDENT_AMBULATORY_CARE_PROVIDER_SITE_OTHER): Payer: 59 | Admitting: Physical Therapy

## 2019-11-13 ENCOUNTER — Other Ambulatory Visit: Payer: Self-pay

## 2019-11-13 ENCOUNTER — Other Ambulatory Visit: Payer: Self-pay | Admitting: Sports Medicine

## 2019-11-13 DIAGNOSIS — F5101 Primary insomnia: Secondary | ICD-10-CM

## 2019-11-13 DIAGNOSIS — G8929 Other chronic pain: Secondary | ICD-10-CM | POA: Diagnosis not present

## 2019-11-13 DIAGNOSIS — M6281 Muscle weakness (generalized): Secondary | ICD-10-CM | POA: Diagnosis not present

## 2019-11-13 DIAGNOSIS — M25511 Pain in right shoulder: Secondary | ICD-10-CM | POA: Diagnosis not present

## 2019-11-13 DIAGNOSIS — R601 Generalized edema: Secondary | ICD-10-CM

## 2019-11-13 DIAGNOSIS — M25611 Stiffness of right shoulder, not elsewhere classified: Secondary | ICD-10-CM

## 2019-11-13 DIAGNOSIS — R29898 Other symptoms and signs involving the musculoskeletal system: Secondary | ICD-10-CM

## 2019-11-13 MED FILL — ELIQUIS 5 MG TABLET: 5 | 30 days supply | Qty: 60 | Fill #1

## 2019-11-13 MED FILL — ZOLPIDEM TARTRATE 10 MG TAB: 10 | 30 days supply | Qty: 30 | Fill #0

## 2019-11-13 MED FILL — MULTAQ 400 MG TABLET: 400 | 30 days supply | Qty: 60 | Fill #2

## 2019-11-13 MED FILL — CYCLOBENZAPRINE HCL 10 MG T: 10 | 90 days supply | Qty: 360 | Fill #3

## 2019-11-13 NOTE — Patient Instructions (Signed)
Access Code: [email or web address] URL: [email or web address] Date: 11/13/2019 Prepared by: Almyra Free  Exercises Seated Scapular Retraction - 2 x daily - 7 x weekly - 1-2 sets - 10 reps - 10 sec hold Isometric Shoulder Abduction at Wall - 2 x daily - 7 x weekly - 1 sets - 5 reps - 5 sec hold Isometric Shoulder External Rotation at Wall - 2 x daily - 7 x weekly - 1 sets - 5 reps - 5 sec hold Isometric Shoulder Flexion at Wall - 2 x daily - 7 x weekly - 1 sets - 5-10 reps - 5 sec hold Isometric Shoulder Extension at Wall - 2 x daily - 7 x weekly - 1 sets - 5-10 reps - 5 sec hold Standing Shoulder Internal Rotation AAROM with Dowel - 1 x daily - 7 x weekly - 1 sets - 10 reps Seated Shoulder External Rotation AAROM with Cane and Hand in Neutral - 1 x daily - 7 x weekly - 1 sets - 10 reps - 5-10 sec hold Standing Shoulder Extension - 1 x daily - 7 x weekly - 2 sets - 10 reps Sidelying Shoulder Flexion 15 Degrees - 1 x daily - 7 x weekly - 1-3 sets - 10 reps Sidelying Shoulder External Rotation - 1 x daily - 7 x weekly - 1-3 sets - 10 reps Supine Shoulder Flexion Extension Full Range AROM - 1 x daily - 7 x weekly - 1-3 sets - 10 reps

## 2019-11-13 NOTE — Therapy (Signed)
[place], [place], [address] Phone: [phone]   Fax:  [phone]  Physical Therapy Treatment  Patient Details  Name: Travis Huff MRN: [id number] Date of Birth: 31-Jul-1960 Referring Provider (PT): Dr Justice Britain    Encounter Date: 11/13/2019   PT End of Session - 11/13/19 1406    Visit Number 6    Number of Visits 20    Date for PT Re-Evaluation 12/23/19    PT Start Time 1406    PT Stop Time 1459    PT Time Calculation (min) 53 min    Activity Tolerance Patient tolerated treatment well    Behavior During Therapy [facility] for tasks assessed/performed           Past Medical History:  Diagnosis Date  . Arrhythmia   . Arthritis   . Asthma    as child  . Avascular necrosis of bone of left hip ([place])   . Back pain   . Blood clot in vein 06/2016   x 2 no bleeding disorders found after back surgery  . Complication of anesthesia    ketamine hallucinations  . Coronary artery disease   . Dislocated hip, left, initial encounter ([place]) 10/03/2018  . Dysrhythmia 2020   a-fib  . Hypertension   . Neuromuscular disorder ([place])    neuropathy bi lat legs knee down  . PAF (paroxysmal atrial fibrillation) ([place])   . Viral pericarditis 20 yrs ago    Past Surgical History:  Procedure Laterality Date  . ABDOMINAL EXPOSURE N/A 06/23/2016   Procedure: ABDOMINAL EXPOSURE;  Surgeon: Rosetta Posner, MD;  Location: [place];  Service: Vascular;  Laterality: N/A;  . ANTERIOR LATERAL LUMBAR FUSION 4 LEVELS Right 06/23/2016   Procedure: Right  Lumbar two-three Lumbar three-four Lumbar four-five Anterolateral lumbar interbody fusion;  Surgeon: Erline Levine, MD;  Location: [place];  Service: Neurosurgery;  Laterality: Right;  . ANTERIOR LUMBAR FUSION N/A 06/23/2016   Procedure: Lumbar five -sacral one  Anterior lumbar interbody fusion with Dr. Sherren Mocha Early for approach;  Surgeon: Erline Levine, MD;  Location: [place];   Service: Neurosurgery;  Laterality: N/A;  . APPLICATION OF INTRAOPERATIVE CT SCAN N/A 06/26/2016   Procedure: APPLICATION OF INTRAOPERATIVE CT SCAN;  Surgeon: Erline Levine, MD;  Location: [place];  Service: Neurosurgery;  Laterality: N/A;  . BACK SURGERY    . CARDIAC CATHETERIZATION    . CARDIOVASCULAR STRESS TEST     Negative in May 2014  . COLONOSCOPY    . HERNIA REPAIR     umbilical  . HIP CLOSED REDUCTION Left 10/03/2018   Procedure: CLOSED REDUCTION HIP;  Surgeon: Rod Can, MD;  [facility];  Service: Orthopedics;  Laterality: Left;  . IR IVC FILTER PLMT / S&I Burke Keels GUID/MOD SED  03/26/2018  . IR RADIOLOGIST EVAL & MGMT  04/16/2019  . JOINT REPLACEMENT    . LEFT HEART CATH AND CORONARY ANGIOGRAPHY N/A 06/21/2018   Procedure: LEFT HEART CATH AND CORONARY ANGIOGRAPHY;  Surgeon: Jettie Booze, MD;  Location: [place] [facility];  Service: Cardiovascular;  Laterality: N/A;  . POSTERIOR LUMBAR FUSION 4 LEVEL N/A 06/26/2016   Procedure: Thoracic ten to Pelvis fixation with AIRO;  Surgeon: Erline Levine, MD;  Location: [place];  Service: Neurosurgery;  Laterality: N/A;  . REVERSE SHOULDER ARTHROPLASTY Right 09/18/2019   Procedure: REVERSE SHOULDER ARTHROPLASTY;  Surgeon: Justice Britain, MD;  [facility];  Service: Orthopedics;  Laterality: Right;  173mn  .  TENDON REPAIR     right hand  . TOTAL HIP ARTHROPLASTY Right 05/25/2014   Procedure: RIGHT TOTAL HIP ARTHROPLASTY ANTERIOR APPROACH;  Surgeon: Rod Can, MD;  Location: [place];  Service: Orthopedics;  Laterality: Right;  . TOTAL HIP ARTHROPLASTY Left 03/27/2018   Procedure: TOTAL HIP ARTHROPLASTY ANTERIOR APPROACH- DA complex;  Surgeon: Rod Can, MD;  [facility];  Service: Orthopedics;  Laterality: Left;  . TOTAL KNEE ARTHROPLASTY Bilateral     There were no vitals filed for this visit.   Subjective Assessment - 11/13/19 1406    Subjective Patient doing well. Plans to continue 1x/wk for strengthening. Saw  MD about 2 weeks ago and he was very pleased with range. Only time he really feels pain is when he is scubbing his post thigh in shower.    Patient Stated Goals use Rt arm for functional activities without irritating anything    Currently in Pain? No/denies              [facility] PT Assessment - 11/13/19 0001      AROM   Left Shoulder Flexion 155 Degrees    Left Shoulder ABduction 160 Degrees    Left Shoulder Internal Rotation 42 Degrees    Left Shoulder External Rotation 70 Degrees                         [facility] Adult PT Treatment/Exercise - 11/13/19 0001      Shoulder Exercises: Supine   Flexion Strengthening;Right;20 reps;Weights;10 reps    Shoulder Flexion Weight (lbs) 0, 1 and 2#       Shoulder Exercises: Sidelying   External Rotation Right;20 reps    Flexion Right;10 reps;20 reps;Weights    Flexion Weight (lbs) 0, 1 and 2#       Shoulder Exercises: Standing   Extension AROM;15 reps    Extension Limitations right lower trap x 10 active depression toward opp hip    Row Both;15 reps    Theraband Level (Shoulder Row) Level 2 (Red)      Shoulder Exercises: Pulleys   Flexion 3 minutes    Scaption 2 minutes    Other Pulley Exercises Wall slides flex x 10      Vasopneumatic   Number Minutes Vasopneumatic  10 minutes    Vasopnuematic Location  Shoulder    Vasopneumatic Pressure Medium    Vasopneumatic Temperature  34 deg                  PT Education - 11/13/19 1454    Education Details HEP updated    Person(s) Educated Patient    Methods Explanation;Demonstration;Handout    Comprehension Verbalized understanding;Returned demonstration            PT Short Term Goals - 11/13/19 1413      PT SHORT TERM GOAL #2   Title Patient demonstrates and verbalizes post op precautions to protect TSA    Status Achieved      PT SHORT TERM GOAL #3   Title Patient demonstrates and verbalizes appropriate positions and postures to support Rt UE and  facilitate healing    Status Achieved             PT Long Term Goals - 11/13/19 1457      PT LONG TERM GOAL #1   Title Improve Rt shoulder ROM to allowed motion per MD protocol    Status Partially Met      PT LONG TERM  GOAL #2   Title Functional strength Rt UE to allow patient to reach overhead and use Rt UE for functional activities    Status On-going                 Plan - 11/13/19 1455    Clinical Impression Statement Patient is progressing very well toward LTGs. He was reminded to not push at end range and use pain as a guide with strengthening. Patient plans to come 1x/wk to progress strength and motion.    PT Frequency 2x / week    PT Treatment/Interventions ADLs/Self Care Home Management;Aquatic Therapy;Cryotherapy;Electrical Stimulation;Iontophoresis 18m/ml Dexamethasone;Moist Heat;Ultrasound;Therapeutic activities;Therapeutic exercise;Balance training;Neuromuscular re-education;Patient/family education;Manual techniques;Passive range of motion;Dry needling;Taping;Vasopneumatic Device    PT Next Visit Plan Assess response to new TE/strengthening; progress rehab per MD protocol; modalities as indicated.    PT Home Exercise Plan [email or web address]    Consulted and Agree with Plan of Care Patient           Patient will benefit from skilled therapeutic intervention in order to improve the following deficits and impairments:  Decreased range of motion, Increased fascial restricitons, Obesity, Impaired flexibility, Improper body mechanics, Postural dysfunction, Impaired sensation, Increased edema, Decreased strength, Decreased mobility, Pain, Decreased activity tolerance, Decreased balance, Decreased endurance, Impaired UE functional use, Abnormal gait  Visit Diagnosis: Chronic right shoulder pain  Other symptoms and signs involving the musculoskeletal system  Muscle weakness (generalized)  Stiffness of right shoulder, not elsewhere classified  Generalized  edema     Problem List Patient Active Problem List   Diagnosis Date Noted  . Secondary hypercoagulable state ([facility] 05/20/2019  . Pseudogout of knee 01/27/2019  . Benign paroxysmal positional vertigo 10/21/2018  . Closed dislocation of left hip ([facility] 10/03/2018  . Cough 09/16/2018  . Paroxysmal atrial fibrillation ([facility] 07/04/2018  . Spontaneous dissection of coronary artery 06/22/2018  . Ischemic cardiomyopathy 06/22/2018  . Near syncope 06/20/2018  . Tachycardia 06/20/2018  . Nausea 06/20/2018  . PVC (premature ventricular contraction) 06/20/2018  . History of DVT (deep vein thrombosis) 06/20/2018  . NSTEMI (non-ST elevated myocardial infarction) ([place] 06/20/2018  . Onychomycosis 06/19/2018  . Venous stasis dermatitis of both lower extremities 06/19/2018  . Insomnia 06/19/2018  . Osteoarthritis of right glenohumeral joint 06/17/2018  . Rotator cuff arthropathy of left shoulder 06/17/2018  . History of bilateral hip arthroplasty 03/27/2018  . Avascular necrosis of bone of hip ([facility] [place] 03/27/2018  . Recurrent deep vein thrombosis (DVT) ([facility] 02/12/2018  . Health counseling 02/12/2018  . Hyperhomocystinemia ([facility] [place] 01/10/2018  . Cellulitis of right foot 01/08/2018  . Intermittent claudication ([facility] 12/04/2017  . Fissure in skin 12/04/2017  . Acute deep vein thrombosis (DVT) of calf muscle vein of right lower extremity ([facility] [place] 11/26/2017  . Acute deep vein thrombosis of lower limb ([facility] 11/26/2017  . Primary osteoarthritis of first carpometacarpal joint of left hand 11/21/2017  . Hand cramps 10/25/2017  . High frequency hearing loss, left 09/10/2017  . Degeneration of thoracic intervertebral disc 05/30/2017  . Mixed hyperlipidemia 04/18/2017  . Increased creatine kinase level 04/18/2017  . Scoliosis of thoracolumbar spine 06/23/2016  . Anxiety, generalized 05/11/2016  . Generalized anxiety disorder 05/11/2016  . Benign essential hypertension 03/26/2014  . Spinal stenosis, lumbar  region, with neurogenic claudication 12/21/2013  . Morbid obesity ([facility] 08/01/2012  . History of bilateral knee arthroplasty with CPPD 01/26/2012  . Erectile dysfunction 01/26/2012  . Annual physical exam 01/26/2012  . Impotence 01/26/2012    JMadelyn FlavorsPT 11/13/2019, 3:08 PM  [place], [place], [address] Phone: [phone]   Fax:  [phone]  Name: Travis Huff MRN: [id number] Date of Birth: 10/18/1960

## 2019-11-20 ENCOUNTER — Other Ambulatory Visit: Payer: Self-pay

## 2019-11-20 ENCOUNTER — Ambulatory Visit (INDEPENDENT_AMBULATORY_CARE_PROVIDER_SITE_OTHER): Payer: 59 | Admitting: Physical Therapy

## 2019-11-20 DIAGNOSIS — R29898 Other symptoms and signs involving the musculoskeletal system: Secondary | ICD-10-CM

## 2019-11-20 DIAGNOSIS — G8929 Other chronic pain: Secondary | ICD-10-CM | POA: Diagnosis not present

## 2019-11-20 DIAGNOSIS — M6281 Muscle weakness (generalized): Secondary | ICD-10-CM

## 2019-11-20 DIAGNOSIS — M25511 Pain in right shoulder: Secondary | ICD-10-CM | POA: Diagnosis not present

## 2019-11-20 DIAGNOSIS — M25611 Stiffness of right shoulder, not elsewhere classified: Secondary | ICD-10-CM

## 2019-11-20 NOTE — Patient Instructions (Signed)
Access Code: [email or web address]: [email or web address]: 11/18/2021Prepared by: [place]  Seated Scapular Retraction - 2 x daily - 7 x weekly - 1-2 sets - 10 reps - 10 sec hold  Seated Shoulder External Rotation AAROM with Cane and Hand in Neutral - 1 x daily - 7 x weekly - 1 sets - 10 reps - 5-10 sec hold  Seated Shoulder Horizontal Abduction with Resistance - Thumbs Up - 1 x daily - 3 x weekly - 1-2 sets - 10 reps  Seated Shoulder Diagonal with Resistance - 1 x daily - 3 x weekly - 1-2 sets - 10 reps  Seated Shoulder Abduction with Resistance - 1 x daily - 3 x weekly - 1-2 sets - 10 reps  Seated Shoulder Row with Anchored Resistance - 1 x daily - 3 x weekly - 1-2 sets - 10 reps  Seated Bilateral Shoulder External Rotation with Resistance - 1 x daily - 3 x weekly - 1-2 sets - 10 reps  Seated Single Arm Elbow Flexion with Resistance - 1 x daily - 3 x weekly - 1-2 sets - 10 reps  Seated Shoulder Extension with Resistance - 1 x daily - 3 x weekly - 1-2 sets - 10 reps

## 2019-11-20 NOTE — Therapy (Addendum)
[place], [place], [address] Phone: [phone]   Fax:  [phone]  Physical Therapy Treatment And Discharge Summary  Patient Details  Name: Travis Huff MRN: [id number] Date of Birth: 12-23-1960 Referring Provider (PT): Dr Justice Britain    Encounter Date: 11/20/2019   PT End of Session - 11/20/19 1109    Visit Number 7    Number of Visits 20    Date for PT Re-Evaluation 12/23/19    PT Start Time 5726    PT Stop Time 1139    PT Time Calculation (min) 34 min    Activity Tolerance Patient tolerated treatment well;No increased pain    Behavior During Therapy WFL for tasks assessed/performed           Past Medical History:  Diagnosis Date  . Arrhythmia   . Arthritis   . Asthma    as child  . Avascular necrosis of bone of left hip ([place])   . Back pain   . Blood clot in vein 06/2016   x 2 no bleeding disorders found after back surgery  . Complication of anesthesia    ketamine hallucinations  . Coronary artery disease   . Dislocated hip, left, initial encounter ([place]) 10/03/2018  . Dysrhythmia 2020   a-fib  . Hypertension   . Neuromuscular disorder ([place])    neuropathy bi lat legs knee down  . PAF (paroxysmal atrial fibrillation) ([place])   . Viral pericarditis 20 yrs ago    Past Surgical History:  Procedure Laterality Date  . ABDOMINAL EXPOSURE N/A 06/23/2016   Procedure: ABDOMINAL EXPOSURE;  Surgeon: Rosetta Posner, MD;  Location: [place];  Service: Vascular;  Laterality: N/A;  . ANTERIOR LATERAL LUMBAR FUSION 4 LEVELS Right 06/23/2016   Procedure: Right  Lumbar two-three Lumbar three-four Lumbar four-five Anterolateral lumbar interbody fusion;  Surgeon: Erline Levine, MD;  Location: [place];  Service: Neurosurgery;  Laterality: Right;  . ANTERIOR LUMBAR FUSION N/A 06/23/2016   Procedure: Lumbar five -sacral one  Anterior lumbar interbody fusion with Dr. Sherren Mocha Early for approach;  Surgeon:  Erline Levine, MD;  Location: [place];  Service: Neurosurgery;  Laterality: N/A;  . APPLICATION OF INTRAOPERATIVE CT SCAN N/A 06/26/2016   Procedure: APPLICATION OF INTRAOPERATIVE CT SCAN;  Surgeon: Erline Levine, MD;  Location: [place];  Service: Neurosurgery;  Laterality: N/A;  . BACK SURGERY    . CARDIAC CATHETERIZATION    . CARDIOVASCULAR STRESS TEST     Negative in May 2014  . COLONOSCOPY    . HERNIA REPAIR     umbilical  . HIP CLOSED REDUCTION Left 10/03/2018   Procedure: CLOSED REDUCTION HIP;  Surgeon: Rod Can, MD;  [facility];  Service: Orthopedics;  Laterality: Left;  . IR IVC FILTER PLMT / S&I Burke Keels GUID/MOD SED  03/26/2018  . IR RADIOLOGIST EVAL & MGMT  04/16/2019  . JOINT REPLACEMENT    . LEFT HEART CATH AND CORONARY ANGIOGRAPHY N/A 06/21/2018   Procedure: LEFT HEART CATH AND CORONARY ANGIOGRAPHY;  Surgeon: Jettie Booze, MD;  Location: [place] [facility];  Service: Cardiovascular;  Laterality: N/A;  . POSTERIOR LUMBAR FUSION 4 LEVEL N/A 06/26/2016   Procedure: Thoracic ten to Pelvis fixation with AIRO;  Surgeon: Erline Levine, MD;  Location: [place];  Service: Neurosurgery;  Laterality: N/A;  . REVERSE SHOULDER ARTHROPLASTY Right 09/18/2019   Procedure: REVERSE SHOULDER ARTHROPLASTY;  Surgeon: Justice Britain, MD;  [facility];  Service: Orthopedics;  Laterality: Right;  15mn  . TENDON REPAIR     right hand  . TOTAL HIP ARTHROPLASTY Right 05/25/2014   Procedure: RIGHT TOTAL HIP ARTHROPLASTY ANTERIOR APPROACH;  Surgeon: BRod Can MD;  [place]  Service: Orthopedics;  Laterality: Right;  . TOTAL HIP ARTHROPLASTY Left 03/27/2018   Procedure: TOTAL HIP ARTHROPLASTY ANTERIOR APPROACH- DA complex;  Surgeon: SRod Can MD;  [facility];  Service: Orthopedics;  Laterality: Left;  . TOTAL KNEE ARTHROPLASTY Bilateral     There were no vitals filed for this visit.   Subjective Assessment - 11/20/19 1332    Subjective "My shoulder feels great.   This will be my last visit".    Patient Stated Goals use Rt arm for functional activities without irritating anything    Currently in Pain? No/denies    Pain Score 0-No pain              [facility] PT Assessment - 11/20/19 0001      Assessment   Medical Diagnosis Rt Reverse TSA    Referring Provider (PT) Dr kJustice Britain    Onset Date/Surgical Date 09/18/19    Hand Dominance Right    Next MD Visit 11/26/19    Prior Therapy yes for knees; back; BPPV      Observation/Other Assessments   Focus on Therapeutic Outcomes (FOTO)  22% limitation      AROM   Right Shoulder Flexion 155 Degrees    Right Shoulder ABduction 160 Degrees    Right Shoulder Internal Rotation 42 Degrees    Right Shoulder External Rotation 70 Degrees             [facility] Adult PT Treatment/Exercise - 11/20/19 0001      Shoulder Exercises: Seated   Extension Both;10 reps;Right;5 reps;Strengthening    Theraband Level (Shoulder Extension) Level 1 (Yellow)    Row Strengthening;Both;10 reps    Theraband Level (Shoulder Row) Level 2 (Red)    Protraction Strengthening;Right;10 reps    Theraband Level (Shoulder Protraction) Level 2 (Red)    Horizontal ABduction Strengthening;Both;10 reps    Theraband Level (Shoulder Horizontal ABduction) Level 1 (Yellow)    External Rotation Strengthening;Both;10 reps    Theraband Level (Shoulder External Rotation) Level 1 (Yellow)    Abduction Strengthening;Right;10 reps   to 90 deg   Theraband Level (Shoulder ABduction) Level 1 (Yellow)    Diagonals Right;Strengthening;10 reps    Theraband Level (Shoulder Diagonals) Level 1 (Yellow)   cues for form   Other Seated Exercises Rt bicep curl with green band x 10     Other Seated Exercises seated W's x 3 reps of 3 sec hold      Shoulder Exercises: Pulleys   Flexion 3 minutes    Scaption 2 minutes           HEP issued along with yellow, red, and green band.          PT Short Term Goals - 11/13/19 1413      PT SHORT TERM  GOAL #2   Title Patient demonstrates and verbalizes post op precautions to protect TSA    Status Achieved      PT SHORT TERM GOAL #3   Title Patient demonstrates and verbalizes appropriate positions and postures to support Rt UE and facilitate healing    Status Achieved             PT Long Term Goals - 11/20/19 1330      PT LONG TERM GOAL #  1   Title Improve Rt shoulder ROM to allowed motion per MD protocol    Status Achieved      PT LONG TERM GOAL #2   Title Functional strength Rt UE to allow patient to reach overhead and use Rt UE for functional activities    Status Achieved      PT LONG TERM GOAL #4   Title Independent in HEP    Time 10    Period Weeks    Status Achieved      PT LONG TERM GOAL #5   Title Improve FOTO to </= 29% limitation    Time 10    Period Weeks    Status Achieved                 Plan - 11/20/19 1328    Clinical Impression Statement Pt is now 9 wks s/p reverse TSA; he has moved to next phase of protocol.  Pt tolerated all resistance exercises well, without any discomfort of difficulty.  Pt has met all goals.  Agreeable to hold therapy 1 month while he works on [employer] and call to return if needed (ie: trouble with HEP).    PT Frequency 2x / week    PT Treatment/Interventions ADLs/Self Care Home Management;Aquatic Therapy;Cryotherapy;Electrical Stimulation;Iontophoresis 4mg /ml Dexamethasone;Moist Heat;Ultrasound;Therapeutic activities;Therapeutic exercise;Balance training;Neuromuscular re-education;Patient/family education;Manual techniques;Passive range of motion;Dry needling;Taping;Vasopneumatic Device    PT Next Visit Plan will hold until 12/18; pt pt doesnt' return - will d/c.    [place] and Agree with Plan of Care Patient           Patient will benefit from skilled therapeutic intervention in order to improve the following deficits and impairments:  Decreased range of motion, Increased fascial  restricitons, Obesity, Impaired flexibility, Improper body mechanics, Postural dysfunction, Impaired sensation, Increased edema, Decreased strength, Decreased mobility, Pain, Decreased activity tolerance, Decreased balance, Decreased endurance, Impaired UE functional use, Abnormal gait  Visit Diagnosis: Chronic right shoulder pain  Other symptoms and signs involving the musculoskeletal system  Muscle weakness (generalized)  Stiffness of right shoulder, not elsewhere classified     Problem List Patient Active Problem List   Diagnosis Date Noted  . Secondary hypercoagulable state ([place]) 05/20/2019  . Pseudogout of knee 01/27/2019  . Benign paroxysmal positional vertigo 10/21/2018  . Closed dislocation of left hip ([place]) 10/03/2018  . Cough 09/16/2018  . Paroxysmal atrial fibrillation ([place]) 07/04/2018  . Spontaneous dissection of coronary artery 06/22/2018  . Ischemic cardiomyopathy 06/22/2018  . Near syncope 06/20/2018  . Tachycardia 06/20/2018  . Nausea 06/20/2018  . PVC (premature ventricular contraction) 06/20/2018  . History of DVT (deep vein thrombosis) 06/20/2018  . NSTEMI (non-ST elevated myocardial infarction) ([place]) 06/20/2018  . Onychomycosis 06/19/2018  . Venous stasis dermatitis of both lower extremities 06/19/2018  . Insomnia 06/19/2018  . Osteoarthritis of right glenohumeral joint 06/17/2018  . Rotator cuff arthropathy of left shoulder 06/17/2018  . History of bilateral hip arthroplasty 03/27/2018  . Avascular necrosis of bone of hip ([place]) 03/27/2018  . Recurrent deep vein thrombosis (DVT) ([place]) 02/12/2018  . Health counseling 02/12/2018  . Hyperhomocystinemia ([place]) 01/10/2018  . Cellulitis of right foot 01/08/2018  . Intermittent claudication ([place]) 12/04/2017  . Fissure in skin 12/04/2017  . Acute deep vein thrombosis (DVT) of calf muscle vein of right lower extremity ([place]) 11/26/2017  . Acute deep vein thrombosis of lower limb ([place]) 11/26/2017  . Primary  osteoarthritis of first carpometacarpal joint of left  hand 11/21/2017  . Hand cramps 10/25/2017  . High frequency hearing loss, left 09/10/2017  . Degeneration of thoracic intervertebral disc 05/30/2017  . Mixed hyperlipidemia 04/18/2017  . Increased creatine kinase level 04/18/2017  . Scoliosis of thoracolumbar spine 06/23/2016  . Anxiety, generalized 05/11/2016  . Generalized anxiety disorder 05/11/2016  . Benign essential hypertension 03/26/2014  . Spinal stenosis, lumbar region, with neurogenic claudication 12/21/2013  . Morbid obesity ([place]) 08/01/2012  . History of bilateral knee arthroplasty with CPPD 01/26/2012  . Erectile dysfunction 01/26/2012  . Annual physical exam 01/26/2012  . Impotence 01/26/2012   [name] Perna, PTA 11/20/19 1:33 PM  [facility] [place], [place], [address] Phone: [phone]   Fax:  [phone]  Name: Travis Huff MRN: [id number] Date of Birth: 1960/01/17  PHYSICAL THERAPY DISCHARGE SUMMARY  Visits from Start of Care: 7  Current functional level related to goals / functional outcomes: unknown   Remaining deficits: unknown   Education / Equipment: HEP  Plan: Patient agrees to discharge.  Patient goals were met. Patient is being discharged due to not returning since the last visit.  ?????    Madelyn Flavors, PT 01/22/20 1:21 PM  [facility] [place], [place] [address]  [phone] (office) [phone] (fax)

## 2019-11-26 ENCOUNTER — Other Ambulatory Visit (HOSPITAL_COMMUNITY): Payer: Self-pay | Admitting: Physician Assistant

## 2019-11-26 ENCOUNTER — Encounter: Payer: 59 | Admitting: Physical Therapy

## 2019-11-26 DIAGNOSIS — M79672 Pain in left foot: Secondary | ICD-10-CM | POA: Diagnosis not present

## 2019-11-26 DIAGNOSIS — G609 Hereditary and idiopathic neuropathy, unspecified: Secondary | ICD-10-CM | POA: Diagnosis not present

## 2019-11-26 DIAGNOSIS — L603 Nail dystrophy: Secondary | ICD-10-CM | POA: Diagnosis not present

## 2019-11-26 DIAGNOSIS — M79671 Pain in right foot: Secondary | ICD-10-CM | POA: Diagnosis not present

## 2019-11-26 DIAGNOSIS — M19079 Primary osteoarthritis, unspecified ankle and foot: Secondary | ICD-10-CM | POA: Diagnosis not present

## 2019-11-26 MED FILL — CARTIA XT 120 MG CP24: 120 | 30 days supply | Qty: 30 | Fill #0

## 2019-12-03 ENCOUNTER — Encounter: Payer: 59 | Admitting: Physical Therapy

## 2019-12-10 DIAGNOSIS — Z96611 Presence of right artificial shoulder joint: Secondary | ICD-10-CM | POA: Diagnosis not present

## 2019-12-10 DIAGNOSIS — Z471 Aftercare following joint replacement surgery: Secondary | ICD-10-CM | POA: Diagnosis not present

## 2019-12-11 ENCOUNTER — Other Ambulatory Visit: Payer: Self-pay | Admitting: Cardiology

## 2019-12-11 MED FILL — MULTAQ 400 MG TABLET: 400 | 30 days supply | Qty: 60 | Fill #3

## 2019-12-11 MED FILL — ELIQUIS 5 MG TABLET: 5 | 30 days supply | Qty: 60 | Fill #2

## 2019-12-11 MED FILL — METOPROLOL SUCCINATE ER 100: 100 | 90 days supply | Qty: 180 | Fill #0

## 2019-12-17 MED FILL — ZOLPIDEM TARTRATE 10 MG TAB: 10 | 30 days supply | Qty: 30 | Fill #1

## 2019-12-23 ENCOUNTER — Telehealth: Payer: Self-pay

## 2019-12-23 ENCOUNTER — Other Ambulatory Visit: Payer: Self-pay

## 2019-12-23 ENCOUNTER — Emergency Department
Admission: EM | Admit: 2019-12-23 | Discharge: 2019-12-23 | Disposition: A | Discharge status: Home / Self Care | Payer: 59 | Source: Home / Self Care | Attending: Family Medicine | Admitting: Family Medicine

## 2019-12-23 DIAGNOSIS — J029 Acute pharyngitis, unspecified: Secondary | ICD-10-CM | POA: Diagnosis not present

## 2019-12-23 LAB — POCT RAPID STREP A (OFFICE): Rapid Strep A Screen: NEGATIVE

## 2019-12-23 MED ORDER — DOXYCYCLINE HYCLATE 100 MG PO CAPS: 100.0000 mg | ORAL_CAPSULE | Freq: Two times a day (BID) | ORAL | 0 refills | Status: DC

## 2019-12-23 NOTE — ED Provider Notes (Addendum)
[facility]ravis [facility] CARE    CSN: [id number] Arrival date & time: 12/23/19  1110      History   Chief Complaint Chief Complaint  Patient presents with  . Sore Throat    HPI Travis Huff is a 59 y.o. male.   Patient woke up this morning with sore throat.  He felt like there was some thick phlegm in his throat that he was unable to cough up.  He looked in his throat and saw what appeared to be an edematous uvula.  He denies any head or chest congestion.  HPI  Past Medical History:  Diagnosis Date  . Arrhythmia   . Arthritis   . Asthma    as child  . Avascular necrosis of bone of left hip ([place])   . Back pain   . Blood clot in vein 06/2016   x 2 no bleeding disorders found after back surgery  . Complication of anesthesia    ketamine hallucinations  . Coronary artery disease   . Dislocated hip, left, initial encounter ([place]) 10/03/2018  . Dysrhythmia 2020   a-fib  . Hypertension   . Neuromuscular disorder ([place])    neuropathy bi lat legs knee down  . PAF (paroxysmal atrial fibrillation) ([place])   . Viral pericarditis 20 yrs ago    Patient Active Problem List   Diagnosis Date Noted  . Secondary hypercoagulable state ([place]) 05/20/2019  . Pseudogout of knee 01/27/2019  . Benign paroxysmal positional vertigo 10/21/2018  . Closed dislocation of left hip ([place]) 10/03/2018  . Cough 09/16/2018  . Paroxysmal atrial fibrillation ([place]) 07/04/2018  . Spontaneous dissection of coronary artery 06/22/2018  . Ischemic cardiomyopathy 06/22/2018  . Near syncope 06/20/2018  . Tachycardia 06/20/2018  . Nausea 06/20/2018  . PVC (premature ventricular contraction) 06/20/2018  . History of DVT (deep vein thrombosis) 06/20/2018  . NSTEMI (non-ST elevated myocardial infarction) ([place]) 06/20/2018  . Onychomycosis 06/19/2018  . Venous stasis dermatitis of both lower extremities 06/19/2018  . Insomnia 06/19/2018  . Osteoarthritis of right glenohumeral joint 06/17/2018  . Rotator cuff  arthropathy of left shoulder 06/17/2018  . History of bilateral hip arthroplasty 03/27/2018  . Avascular necrosis of bone of hip ([place]) 03/27/2018  . Recurrent deep vein thrombosis (DVT) ([place]) 02/12/2018  . Health counseling 02/12/2018  . Hyperhomocystinemia ([place]) 01/10/2018  . Cellulitis of right foot 01/08/2018  . Intermittent claudication ([place]) 12/04/2017  . Fissure in skin 12/04/2017  . Acute deep vein thrombosis (DVT) of calf muscle vein of right lower extremity ([place]) 11/26/2017  . Acute deep vein thrombosis of lower limb ([place]) 11/26/2017  . Primary osteoarthritis of first carpometacarpal joint of left hand 11/21/2017  . Hand cramps 10/25/2017  . High frequency hearing loss, left 09/10/2017  . Degeneration of thoracic intervertebral disc 05/30/2017  . Mixed hyperlipidemia 04/18/2017  . Increased creatine kinase level 04/18/2017  . Scoliosis of thoracolumbar spine 06/23/2016  . Anxiety, generalized 05/11/2016  . Generalized anxiety disorder 05/11/2016  . Benign essential hypertension 03/26/2014  . Spinal stenosis, lumbar region, with neurogenic claudication 12/21/2013  . Morbid obesity ([place]) 08/01/2012  . History of bilateral knee arthroplasty with CPPD 01/26/2012  . Erectile dysfunction 01/26/2012  . Annual physical exam 01/26/2012  . Impotence 01/26/2012    Past Surgical History:  Procedure Laterality Date  . ABDOMINAL EXPOSURE N/A 06/23/2016   Procedure: ABDOMINAL EXPOSURE;  Surgeon: Rosetta Posner, MD;  Location: [facility];  Service: Vascular;  Laterality: N/A;  . ANTERIOR LATERAL LUMBAR FUSION 4  LEVELS Right 06/23/2016   Procedure: Right  Lumbar two-three Lumbar three-four Lumbar four-five Anterolateral lumbar interbody fusion;  Surgeon: Erline Levine, MD;  Location: [facility];  Service: Neurosurgery;  Laterality: Right;  . ANTERIOR LUMBAR FUSION N/A 06/23/2016   Procedure: Lumbar five -sacral one  Anterior lumbar interbody fusion with Dr. Sherren Mocha Early for approach;  Surgeon: Erline Levine, MD;  Location: [facility];  Service: Neurosurgery;  Laterality: N/A;  . APPLICATION OF INTRAOPERATIVE CT SCAN N/A 06/26/2016   Procedure: APPLICATION OF INTRAOPERATIVE CT SCAN;  Surgeon: Erline Levine, MD;  Location: [facility];  Service: Neurosurgery;  Laterality: N/A;  . BACK SURGERY    . CARDIAC CATHETERIZATION    . CARDIOVASCULAR STRESS TEST     Negative in May 2014  . COLONOSCOPY    . HERNIA REPAIR     umbilical  . HIP CLOSED REDUCTION Left 10/03/2018   Procedure: CLOSED REDUCTION HIP;  Surgeon: Rod Can, MD;  [facility];  Service: Orthopedics;  Laterality: Left;  . IR IVC FILTER PLMT / S&I Burke Keels GUID/MOD SED  03/26/2018  . IR RADIOLOGIST EVAL & MGMT  04/16/2019  . JOINT REPLACEMENT    . LEFT HEART CATH AND CORONARY ANGIOGRAPHY N/A 06/21/2018   Procedure: LEFT HEART CATH AND CORONARY ANGIOGRAPHY;  Surgeon: Jettie Booze, MD;  Location: [facility];  Service: Cardiovascular;  Laterality: N/A;  . POSTERIOR LUMBAR FUSION 4 LEVEL N/A 06/26/2016   Procedure: Thoracic ten to Pelvis fixation with AIRO;  Surgeon: Erline Levine, MD;  Location: [facility];  Service: Neurosurgery;  Laterality: N/A;  . REVERSE SHOULDER ARTHROPLASTY Right 09/18/2019   Procedure: REVERSE SHOULDER ARTHROPLASTY;  Surgeon: Justice Britain, MD;  [facility];  Service: Orthopedics;  Laterality: Right;  170min  . TENDON REPAIR     right hand  . TOTAL HIP ARTHROPLASTY Right 05/25/2014   Procedure: RIGHT TOTAL HIP ARTHROPLASTY ANTERIOR APPROACH;  Surgeon: Rod Can, MD;  Location: [facility];  Service: Orthopedics;  Laterality: Right;  . TOTAL HIP ARTHROPLASTY Left 03/27/2018   Procedure: TOTAL HIP ARTHROPLASTY ANTERIOR APPROACH- DA complex;  Surgeon: Rod Can, MD;  [facility];  Service: Orthopedics;  Laterality: Left;  . TOTAL KNEE ARTHROPLASTY Bilateral        Home Medications    Prior to Admission medications   Medication Sig Start Date End Date Taking? Authorizing Provider   acetaminophen (TYLENOL) 325 MG tablet Take 650 mg by mouth every 6 (six) hours as needed for mild pain or headache.    [provider]  atorvastatin (LIPITOR) 80 MG tablet Take 1 tablet (80 mg total) by mouth daily at 6 PM. 07/08/19   Belva Crome, MD  CARTIA XT 120 MG 24 hr capsule TAKE 1 CAPSULE (120 MG TOTAL) BY MOUTH DAILY. 11/26/19   Fenton, Clint R, PA  cyclobenzaprine (FLEXERIL) 10 MG tablet Take 1 tablet (10 mg total) by mouth 3 (three) times daily as needed for muscle spasms. 09/18/19   Shuford, Olivia Mackie, PA-C  docusate sodium (COLACE) 100 MG capsule Take 1 capsule (100 mg total) by mouth 2 (two) times daily. 03/28/18   Swinteck, Aaron Edelman, MD  doxycycline (VIBRAMYCIN) 100 MG capsule Take 1 capsule (100 mg total) by mouth 2 (two) times daily. 12/23/19   Wardell Honour, MD  DULoxetine (CYMBALTA) 60 MG capsule TAKE 1 CAPSULE (60 MG TOTAL) BY MOUTH AT BEDTIME. 04/02/19   Silverio Decamp, MD  ELIQUIS 5 MG TABS tablet TAKE 1 TABLET BY MOUTH TWICE  DAILY 10/07/19   Camnitz, Will Hassell Done, MD  lisinopril-hydrochlorothiazide (ZESTORETIC) 20-25 MG tablet TAKE 1 TABLET BY MOUTH DAILY. 10/07/19   Silverio Decamp, MD  metoprolol succinate (TOPROL-XL) 100 MG 24 hr tablet TAKE 2 TABLETS BY MOUTH EVERY MORNING 12/11/19   Isaiah Serge, NP  MULTAQ 400 MG tablet TAKE 1 TABLET (400 MG TOTAL) BY MOUTH 2 (TWO) TIMES DAILY WITH A MEAL. 09/09/19   Fenton, Clint R, PA  nitroGLYCERIN (NITROSTAT) 0.4 MG SL tablet Place 1 tablet (0.4 mg total) under the tongue every 5 (five) minutes x 3 doses as needed for chest pain. If taking third dose, call 911. 06/22/18   Marrianne Mood D, PA-C  ondansetron (ZOFRAN) 4 MG tablet Take 1 tablet (4 mg total) by mouth every 8 (eight) hours as needed for nausea or vomiting. 09/18/19   Shuford, Olivia Mackie, PA-C  oxyCODONE-acetaminophen (PERCOCET) 5-325 MG tablet Take 1 tablet by mouth every 4 (four) hours as needed (max 6 q). 09/18/19   Shuford, Olivia Mackie, PA-C  zolpidem (AMBIEN) 10  MG tablet TAKE 1 TABLET BY MOUTH EVERY NIGHT AT BEDTIME AS NEEDED FOR SLEEP 11/13/19   Silverio Decamp, MD    Family History Family History  Adopted: Yes  Problem Relation Age of Onset  . Healthy Daughter   . Healthy Daughter     Social History Social History   Tobacco Use  . Smoking status: Former Smoker    Packs/day: 1.00    Years: 25.00    Pack years: 25.00    Types: Cigarettes    Quit date: 2000    Years since quitting: 21.9  . Smokeless tobacco: Current User    Types: Snuff  Vaping Use  . Vaping Use: Never used  Substance Use Topics  . Alcohol use: Yes    Alcohol/week: 14.0 standard drinks    Types: 14 Cans of beer per week    Comment: 2 beers QD  . Drug use: No     Allergies   Ketamine and Morphine and related   Review of Systems Review of Systems  HENT: Positive for sore throat.   Respiratory: Positive for cough.   All other systems reviewed and are negative.    Physical Exam Triage Vital Signs ED Triage Vitals  Enc Vitals Group     BP 12/23/19 1123 (!) 169/106     Pulse Rate 12/23/19 1123 72     Resp 12/23/19 1123 18     Temp 12/23/19 1123 98.1 F (36.7 C)     Temp Source 12/23/19 1123 Oral     SpO2 12/23/19 1123 97 %     Weight --      Height --      Head Circumference --      Peak Flow --      Pain Score 12/23/19 1124 1     Pain Loc --      Pain Edu? --      Excl. in [place]? --    No data found.  Updated Vital Signs BP 131/85 Comment: recheck  Pulse 72   Temp 98.1 F (36.7 C) (Oral)   Resp 18   SpO2 97%   Visual Acuity Right Eye Distance:   Left Eye Distance:   Bilateral Distance:    Right Eye Near:   Left Eye Near:    Bilateral Near:     Physical Exam Constitutional:      Appearance: He is well-developed.  HENT:     Right Ear: Tympanic  membrane normal.     Left Ear: Tympanic membrane normal.     Mouth/Throat:     Mouth: Mucous membranes are moist.     Comments: There does appear to be some swelling of the  uvula Cardiovascular:     Rate and Rhythm: Normal rate and regular rhythm.  Pulmonary:     Effort: Pulmonary effort is normal.     Breath sounds: Normal breath sounds.  Neurological:     General: No focal deficit present.     Mental Status: He is alert and oriented to person, place, and time.      UC Treatments / Results  Labs (all labs ordered are listed, but only abnormal results are displayed) Labs Reviewed - No data to display  EKG   Radiology No results found.  Procedures Procedures (including critical care time)  Medications Ordered in UC Medications - No data to display  Initial Impression / Assessment and Plan / UC Course  I have reviewed the triage vital signs and the nursing notes.  Pertinent labs & imaging results that were available during my care of the patient were reviewed by me and considered in my medical decision making (see chart for details).     Pharyngitis.  Suggested Mucinex to help cough up any secretions rapid strep is negative Final Clinical Impressions(s) / UC Diagnoses   Final diagnoses:  Viral pharyngitis     Discharge Instructions     Pick up some Mucinex and take that to thin secretions and help cough become more productive   ED Prescriptions    Medication Sig Dispense Auth. Provider   doxycycline (VIBRAMYCIN) 100 MG capsule Take 1 capsule (100 mg total) by mouth 2 (two) times daily. 20 capsule Wardell Honour, MD     PDMP not reviewed this encounter.   Wardell Honour, MD 12/23/19 1200    Wardell Honour, MD 12/23/19 [phone]

## 2019-12-23 NOTE — ED Triage Notes (Signed)
Pt says he woke up today feeling like he had some congestion. Felt like he needed to clear his throat but wasn't able to. Denies fever.

## 2019-12-23 NOTE — Telephone Encounter (Signed)
Walked AVS out to patient and gave him his rapid strep results  which were negative, also confirmed script for doxy was [facility]. Travis Huff & his wife asked me to thank Dr Sabra Heck for walking out to the car to swab patient so they did not have to come back into [facility].

## 2019-12-23 NOTE — Discharge Instructions (Signed)
Pick up some Mucinex and take that to thin secretions and help cough become more productive

## 2019-12-29 ENCOUNTER — Encounter: Payer: Self-pay | Admitting: Cardiology

## 2019-12-29 ENCOUNTER — Ambulatory Visit: Payer: 59 | Admitting: Cardiology

## 2019-12-29 ENCOUNTER — Other Ambulatory Visit: Payer: Self-pay

## 2019-12-29 VITALS — BP 134/70 | HR 65 | Ht 72.0 in | Wt 337.0 lb

## 2019-12-29 DIAGNOSIS — I480 Paroxysmal atrial fibrillation: Secondary | ICD-10-CM | POA: Diagnosis not present

## 2019-12-29 MED FILL — CARTIA XT 120 MG CP24: 120 | 30 days supply | Qty: 30 | Fill #1

## 2019-12-29 NOTE — Patient Instructions (Signed)
Medication Instructions:  Your physician recommends that you continue on your current medications as directed. Please refer to the Current Medication list given to you today.  *If you need a refill on your cardiac medications before your next appointment, please call your pharmacy*   Lab Work: None ordered   Testing/Procedures: None ordered   Follow-Up: At [facility], you and your health needs are our priority.  As part of our continuing mission to provide you with exceptional heart care, we have created designated Provider Care Teams.  These Care Teams include your primary Cardiologist (physician) and Advanced Practice Providers (APPs -  Physician Assistants and Nurse Practitioners) who all work together to provide you with the care you need, when you need it.  Your next appointment:   6 month(s)  The format for your next appointment:   In Person  Provider:   You will follow up in the Atrial Fibrillation Clinic located at [facility]. Your provider will be: Clint R. Fenton, PA-C   Your physician wants you to follow-up in: 1 year with Dr. Elberta Fortis. You will receive a reminder letter in the mail two months in advance. If you don't receive a letter, please call our office to schedule the follow-up appointment.   Thank you for choosing [facility]!!   Dory Horn, RN [phone]   Other Instructions

## 2019-12-29 NOTE — Progress Notes (Signed)
Electrophysiology Office Note   Date:  12/29/2019   ID:  Keyaun, Matta 1960/03/31, MRN [id number]  PCP:  Silverio Decamp, MD  Cardiologist: Tamala Julian Primary Electrophysiologist:  Taisei Bonnette Meredith Leeds, MD    No chief complaint on file.    History of Present Illness: EIN BUDZ is a 59 y.o. male who is being seen today for the evaluation of atrial fibrillation at the request of Silverio Decamp Presenting today for electrophysiology evaluation.    He has a history significant for anxiety, hypertension, obesity, multiple PEs on Eliquis status post IVC filter, and non-STEMI due to coronary artery dissection, paroxysmal atrial fibrillation and chronic combined systolic and diastolic heart failure due to ischemic cardiomyopathy.  He had a hospitalization for palpitations, weakness, and syncope.  He initially presented to his primary physician's office and was found to have elevated troponin on labs.  He went to the hospital and had a left heart catheterization which showed an LAD dissection and apical narrowing.  He wore a cardiac monitor for evaluation of near syncope and found rapid atrial fibrillation.  His metoprolol dose was increased.  Today, denies symptoms of palpitations, chest pain, shortness of breath, orthopnea, PND, lower extremity edema, claudication, dizziness, presyncope, syncope, bleeding, or neurologic sequela. The patient is tolerating medications without difficulties.  Since last being seen he has done well.  He has no chest pain or shortness of breath and is able to do all of his daily activities without restriction.  He is noted no further episodes of atrial fibrillation since starting the Multaq and Cardizem.   Past Medical History:  Diagnosis Date  . Arrhythmia   . Arthritis   . Asthma    as child  . Avascular necrosis of bone of left hip ([place])   . Back pain   . Blood clot in vein 06/2016   x 2 no bleeding disorders found after back  surgery  . Complication of anesthesia    ketamine hallucinations  . Coronary artery disease   . Dislocated hip, left, initial encounter ([place]) 10/03/2018  . Dysrhythmia 2020   a-fib  . Hypertension   . Neuromuscular disorder ([place])    neuropathy bi lat legs knee down  . PAF (paroxysmal atrial fibrillation) ([place])   . Viral pericarditis 20 yrs ago   Past Surgical History:  Procedure Laterality Date  . ABDOMINAL EXPOSURE N/A 06/23/2016   Procedure: ABDOMINAL EXPOSURE;  Surgeon: Rosetta Posner, MD;  Location: [place] [place];  Service: Vascular;  Laterality: N/A;  . ANTERIOR LATERAL LUMBAR FUSION 4 LEVELS Right 06/23/2016   Procedure: Right  Lumbar two-three Lumbar three-four Lumbar four-five Anterolateral lumbar interbody fusion;  Surgeon: Erline Levine, MD;  Location: [place];  Service: Neurosurgery;  Laterality: Right;  . ANTERIOR LUMBAR FUSION N/A 06/23/2016   Procedure: Lumbar five -sacral one  Anterior lumbar interbody fusion with Dr. Sherren Mocha Early for approach;  Surgeon: Erline Levine, MD;  Location: [facility] [place];  Service: Neurosurgery;  Laterality: N/A;  . APPLICATION OF INTRAOPERATIVE CT SCAN N/A 06/26/2016   Procedure: APPLICATION OF INTRAOPERATIVE CT SCAN;  Surgeon: Erline Levine, MD;  Location: [place];  Service: Neurosurgery;  Laterality: N/A;  . BACK SURGERY    . CARDIAC CATHETERIZATION    . CARDIOVASCULAR STRESS TEST     Negative in May 2014  . COLONOSCOPY    . HERNIA REPAIR     umbilical  . HIP CLOSED REDUCTION Left 10/03/2018   Procedure: CLOSED REDUCTION HIP;  Surgeon: Lyla Glassing,  Aaron Edelman, MD;  [facility];  Service: Orthopedics;  Laterality: Left;  . IR IVC FILTER PLMT / S&I Burke Keels GUID/MOD SED  03/26/2018  . IR RADIOLOGIST EVAL & MGMT  04/16/2019  . JOINT REPLACEMENT    . LEFT HEART CATH AND CORONARY ANGIOGRAPHY N/A 06/21/2018   Procedure: LEFT HEART CATH AND CORONARY ANGIOGRAPHY;  Surgeon: Jettie Booze, MD;  Location: [place] [facility];  Service: Cardiovascular;  Laterality: N/A;  .  POSTERIOR LUMBAR FUSION 4 LEVEL N/A 06/26/2016   Procedure: Thoracic ten to Pelvis fixation with AIRO;  Surgeon: Erline Levine, MD;  Location: [place];  Service: Neurosurgery;  Laterality: N/A;  . REVERSE SHOULDER ARTHROPLASTY Right 09/18/2019   Procedure: REVERSE SHOULDER ARTHROPLASTY;  Surgeon: Justice Britain, MD;  [facility];  Service: Orthopedics;  Laterality: Right;  159min  . TENDON REPAIR     right hand  . TOTAL HIP ARTHROPLASTY Right 05/25/2014   Procedure: RIGHT TOTAL HIP ARTHROPLASTY ANTERIOR APPROACH;  Surgeon: Rod Can, MD;  Location: [place];  Service: Orthopedics;  Laterality: Right;  . TOTAL HIP ARTHROPLASTY Left 03/27/2018   Procedure: TOTAL HIP ARTHROPLASTY ANTERIOR APPROACH- DA complex;  Surgeon: Rod Can, MD;  [facility];  Service: Orthopedics;  Laterality: Left;  . TOTAL KNEE ARTHROPLASTY Bilateral      Current Outpatient Medications  Medication Sig Dispense Refill  . acetaminophen (TYLENOL) 325 MG tablet Take 650 mg by mouth every 6 (six) hours as needed for mild pain or headache.    [name] atorvastatin (LIPITOR) 80 MG tablet Take 1 tablet (80 mg total) by mouth daily at 6 PM. 90 tablet 2  . CARTIA XT 120 MG 24 hr capsule TAKE 1 CAPSULE (120 MG TOTAL) BY MOUTH DAILY. 30 capsule 11  . cyclobenzaprine (FLEXERIL) 10 MG tablet Take 1 tablet (10 mg total) by mouth 3 (three) times daily as needed for muscle spasms. 30 tablet 1  . docusate sodium (COLACE) 100 MG capsule Take 1 capsule (100 mg total) by mouth 2 (two) times daily. 60 capsule 1  . doxycycline (VIBRAMYCIN) 100 MG capsule Take 1 capsule (100 mg total) by mouth 2 (two) times daily. 20 capsule 0  . DULoxetine (CYMBALTA) 60 MG capsule TAKE 1 CAPSULE (60 MG TOTAL) BY MOUTH AT BEDTIME. 90 capsule 3  . ELIQUIS 5 MG TABS tablet TAKE 1 TABLET BY MOUTH TWICE DAILY 60 tablet 5  . lisinopril-hydrochlorothiazide (ZESTORETIC) 20-25 MG tablet TAKE 1 TABLET BY MOUTH DAILY. 90 tablet 1  . metoprolol succinate (TOPROL-XL)  100 MG 24 hr tablet TAKE 2 TABLETS BY MOUTH EVERY MORNING 180 tablet 2  . MULTAQ 400 MG tablet TAKE 1 TABLET (400 MG TOTAL) BY MOUTH 2 (TWO) TIMES DAILY WITH A MEAL. 60 tablet 3  . nitroGLYCERIN (NITROSTAT) 0.4 MG SL tablet Place 1 tablet (0.4 mg total) under the tongue every 5 (five) minutes x 3 doses as needed for chest pain. If taking third dose, call 911. 25 tablet 3  . ondansetron (ZOFRAN) 4 MG tablet Take 1 tablet (4 mg total) by mouth every 8 (eight) hours as needed for nausea or vomiting. 10 tablet 0  . oxyCODONE-acetaminophen (PERCOCET) 5-325 MG tablet Take 1 tablet by mouth every 4 (four) hours as needed (max 6 q). 30 tablet 0  . zolpidem (AMBIEN) 10 MG tablet TAKE 1 TABLET BY MOUTH EVERY NIGHT AT BEDTIME AS NEEDED FOR SLEEP 30 tablet 1   No current facility-administered medications for this visit.    Allergies:  Ketamine and Morphine and related   Social History:  The patient  reports that he quit smoking about 22 years ago. His smoking use included cigarettes. He has a 25.00 pack-year smoking history. His smokeless tobacco use includes snuff. He reports current alcohol use of about 14.0 standard drinks of alcohol per week. He reports that he does not use drugs.   Family History:  The patient's family history includes Healthy in his daughter and daughter. He was adopted.   ROS:  Please see the history of present illness.   Otherwise, review of systems is positive for none.   All other systems are reviewed and negative.   PHYSICAL EXAM: VS:  BP 134/70   Pulse 65   Ht 6' (1.829 m)   Wt (!) 337 lb (152.9 kg)   SpO2 94%   BMI 45.71 kg/m  , BMI Body mass index is 45.71 kg/m. GEN: Well nourished, well developed, in no acute distress  HEENT: normal  Neck: no JVD, carotid bruits, or masses Cardiac: RRR; no murmurs, rubs, or gallops,no edema  Respiratory:  clear to auscultation bilaterally, normal work of breathing GI: soft, nontender, nondistended, + BS MS: no deformity or  atrophy  Skin: warm and dry Neuro:  Strength and sensation are intact Psych: euthymic mood, full affect  EKG:  EKG is ordered today. Personal review of the ekg ordered shows sinus rhythm, rate 65  Recent Labs: 09/12/2019: BUN 17; Creatinine, Ser 0.97; Hemoglobin 16.2; Platelets 200; Potassium 4.2; Sodium 139    Lipid Panel     Component Value Date/Time   CHOL 148 11/20/2018 1147   TRIG 111 11/20/2018 1147   HDL 53 11/20/2018 1147   CHOLHDL 2.8 11/20/2018 1147   VLDL 15 05/23/2012 0610   LDLCALC 76 11/20/2018 1147     Wt Readings from Last 3 Encounters:  12/29/19 (!) 337 lb (152.9 kg)  09/18/19 (!) 328 lb (148.8 kg)  09/17/19 (!) 328 lb (148.8 kg)      Other studies Reviewed: Additional studies/ records that were reviewed today include: TTE 09/30/2018 Review of the above records today demonstrates:   1. Left ventricular ejection fraction, by visual estimation, is 65 to 70%. The left ventricle has normal function. Normal left ventricular size. There is mildly increased left ventricular hypertrophy.  2. Definity contrast agent was given IV to delineate the left ventricular endocardial borders.  3. Left ventricular diastolic Doppler parameters are indeterminate pattern of LV diastolic filling.  4. Global right ventricle has normal systolic function.The right ventricular size is normal. No increase in right ventricular wall thickness.  5. The left ventricular function has improved from previous echo  [facility] 06/21/18  Dist LAD lesion is 75% stenosed. 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  There is mild left ventricular systolic dysfunction.  The left ventricular ejection fraction is 35-45% by visual estimate.  There is no aortic valve stenosis.    ASSESSMENT AND PLAN:  1.  Paroxysmal atrial fibrillation: Currently on Eliquis, Cardizem, Multaq with a CHA2DS2-VASc of 2.  High risk medication monitoring.  He has not noted any further episodes of atrial  fibrillation since starting the Cardizem.  He currently feels well and is without complaint.  2.  Coronary artery disease: Distal LAD stenosis.  No current chest pain.  3.  Cardiomyopathy: Repeat echo shows normalization of his ejection fraction.  No ICD plan.  Continue plan per primary cardiology.    Current medicines are reviewed at length with the patient today.  The patient does not have concerns regarding his medicines.  The following changes were made today: None  Labs/ tests ordered today include:  Orders Placed This Encounter  Procedures  . EKG 12-Lead    Disposition:   FU with Dwana Garin 12 months  Signed, Jamaris Biernat Jorja Loa, MD  12/29/2019 3:41 PM     [facility] [address] [place] [address] [phone] (office) [phone] (fax)

## 2020-01-01 ENCOUNTER — Ambulatory Visit (HOSPITAL_COMMUNITY): Payer: 59 | Admitting: Physician Assistant

## 2020-01-02 MED FILL — LISINOPRIL-HCTZ 20-25 MG TA: 20-25 | 90 days supply | Qty: 90 | Fill #1

## 2020-01-05 MED FILL — DULOXETINE HCL 60 MG CPEP: 60 | 90 days supply | Qty: 90 | Fill #3

## 2020-01-05 MED FILL — ATORVASTATIN 80 MG TABLET: 80 | 90 days supply | Qty: 90 | Fill #1

## 2020-01-12 MED FILL — ELIQUIS 5 MG TABLET: 5 | 30 days supply | Qty: 60 | Fill #3

## 2020-01-13 ENCOUNTER — Ambulatory Visit (INDEPENDENT_AMBULATORY_CARE_PROVIDER_SITE_OTHER): Payer: 59 | Admitting: Sports Medicine

## 2020-01-13 ENCOUNTER — Other Ambulatory Visit: Payer: Self-pay

## 2020-01-13 ENCOUNTER — Ambulatory Visit (INDEPENDENT_AMBULATORY_CARE_PROVIDER_SITE_OTHER): Payer: 59

## 2020-01-13 DIAGNOSIS — W19XXXA Unspecified fall, initial encounter: Secondary | ICD-10-CM

## 2020-01-13 DIAGNOSIS — S8992XA Unspecified injury of left lower leg, initial encounter: Secondary | ICD-10-CM | POA: Diagnosis not present

## 2020-01-13 DIAGNOSIS — M25572 Pain in left ankle and joints of left foot: Secondary | ICD-10-CM | POA: Diagnosis not present

## 2020-01-13 DIAGNOSIS — K635 Polyp of colon: Secondary | ICD-10-CM | POA: Diagnosis not present

## 2020-01-13 DIAGNOSIS — S8265XA Nondisplaced fracture of lateral malleolus of left fibula, initial encounter for closed fracture: Secondary | ICD-10-CM | POA: Diagnosis not present

## 2020-01-13 DIAGNOSIS — M778 Other enthesopathies, not elsewhere classified: Secondary | ICD-10-CM | POA: Diagnosis not present

## 2020-01-13 DIAGNOSIS — M7989 Other specified soft tissue disorders: Secondary | ICD-10-CM | POA: Diagnosis not present

## 2020-01-13 DIAGNOSIS — M79672 Pain in left foot: Secondary | ICD-10-CM

## 2020-01-13 DIAGNOSIS — S8262XA Displaced fracture of lateral malleolus of left fibula, initial encounter for closed fracture: Secondary | ICD-10-CM | POA: Insufficient documentation

## 2020-01-13 NOTE — Progress Notes (Signed)
    Procedures performed today:    None.  Independent interpretation of notes and tests performed by another provider:   X-rays personally reviewed and show a tiny avulsion from the tip of the lateral malleolus.  Brief History, Exam, Impression, and Recommendations:    Closed fracture of fibula, lateral malleolus, left Travis Huff is a pleasant 60 year old male, he had an inversion injury approximately 8 days ago, immediate pain and swelling in his leg, today x-rays do show an avulsion from his lateral malleolus, this is very small and will be treated with a boot, I like to see him back in about a month to see how things are doing, at that point we can transition him into an ASO.  Colon polyp Travis Huff is due for another colonoscopy, he did have a 3-year follow-up planned back when he was 36. We will set him up again with Dr. Paulita Fujita.    ___________________________________________ Gwen Her. Dianah Field, M.D., ABFM., CAQSM. Primary Care and [place] Instructor of [place] of [facility] [employer]

## 2020-01-13 NOTE — Assessment & Plan Note (Signed)
Dason is a pleasant 60 year old male, he had an inversion injury approximately 8 days ago, immediate pain and swelling in his leg, today x-rays do show an avulsion from his lateral malleolus, this is very small and will be treated with a boot, I like to see him back in about a month to see how things are doing, at that point we can transition him into an ASO.

## 2020-01-13 NOTE — Assessment & Plan Note (Signed)
Travis Huff is due for another colonoscopy, he did have a 3-year follow-up planned back when he was 53. We will set him up again with Dr. Paulita Fujita.

## 2020-01-16 ENCOUNTER — Other Ambulatory Visit (HOSPITAL_COMMUNITY): Payer: Self-pay | Admitting: Physician Assistant

## 2020-01-16 ENCOUNTER — Other Ambulatory Visit: Payer: Self-pay | Admitting: Sports Medicine

## 2020-01-16 DIAGNOSIS — F5101 Primary insomnia: Secondary | ICD-10-CM

## 2020-01-16 MED FILL — MULTAQ 400 MG TABLET: 400 | 30 days supply | Qty: 60 | Fill #0

## 2020-01-16 NOTE — Telephone Encounter (Signed)
Last written 11/13/2019 ##30 with 1 refill  Last visit 01/13/2020

## 2020-01-19 MED FILL — ZOLPIDEM TARTRATE 10 MG TAB: 10 | 90 days supply | Qty: 90 | Fill #0

## 2020-02-02 MED FILL — CARTIA XT 120 MG CP24: 120 | 30 days supply | Qty: 30 | Fill #2

## 2020-02-03 ENCOUNTER — Encounter (INDEPENDENT_AMBULATORY_CARE_PROVIDER_SITE_OTHER): Payer: Self-pay | Admitting: Family Medicine

## 2020-02-03 ENCOUNTER — Other Ambulatory Visit: Payer: Self-pay

## 2020-02-03 ENCOUNTER — Ambulatory Visit (INDEPENDENT_AMBULATORY_CARE_PROVIDER_SITE_OTHER): Payer: 59 | Admitting: Family Medicine

## 2020-02-03 VITALS — BP 153/78 | HR 80 | Temp 98.0°F | Ht 71.0 in | Wt 350.5 lb

## 2020-02-03 DIAGNOSIS — E7849 Other hyperlipidemia: Secondary | ICD-10-CM

## 2020-02-03 DIAGNOSIS — R7303 Prediabetes: Secondary | ICD-10-CM | POA: Diagnosis not present

## 2020-02-03 DIAGNOSIS — I4891 Unspecified atrial fibrillation: Secondary | ICD-10-CM

## 2020-02-03 DIAGNOSIS — R0602 Shortness of breath: Secondary | ICD-10-CM | POA: Insufficient documentation

## 2020-02-03 DIAGNOSIS — Z9189 Other specified personal risk factors, not elsewhere classified: Secondary | ICD-10-CM | POA: Diagnosis not present

## 2020-02-03 DIAGNOSIS — R5383 Other fatigue: Secondary | ICD-10-CM | POA: Diagnosis not present

## 2020-02-03 DIAGNOSIS — I10 Essential (primary) hypertension: Secondary | ICD-10-CM

## 2020-02-03 DIAGNOSIS — Z0289 Encounter for other administrative examinations: Secondary | ICD-10-CM

## 2020-02-03 DIAGNOSIS — Z6841 Body Mass Index (BMI) 40.0 and over, adult: Secondary | ICD-10-CM

## 2020-02-03 DIAGNOSIS — F39 Unspecified mood [affective] disorder: Secondary | ICD-10-CM | POA: Insufficient documentation

## 2020-02-04 LAB — COMPREHENSIVE METABOLIC PANEL
ALT: 34 IU/L (ref 0–44)
AST: 39 IU/L (ref 0–40)
Albumin/Globulin Ratio: 1.8 (ref 1.2–2.2)
Albumin: 4.2 g/dL (ref 3.8–4.9)
Alkaline Phosphatase: 149 IU/L — ABNORMAL HIGH (ref 44–121)
BUN/Creatinine Ratio: 18 (ref 9–20)
BUN: 17 mg/dL (ref 6–24)
Bilirubin Total: 0.3 mg/dL (ref 0.0–1.2)
CO2: 25 mmol/L (ref 20–29)
Calcium: 9.3 mg/dL (ref 8.7–10.2)
Chloride: 98 mmol/L (ref 96–106)
Creatinine, Ser: 0.93 mg/dL (ref 0.76–1.27)
GFR calc Af Amer: 104 mL/min/{1.73_m2} (ref >59)
GFR calc non Af Amer: 90 mL/min/{1.73_m2} (ref >59)
Globulin, Total: 2.4 g/dL (ref 1.5–4.5)
Glucose: 97 mg/dL (ref 65–99)
Potassium: 4.4 mmol/L (ref 3.5–5.2)
Sodium: 140 mmol/L (ref 134–144)
Total Protein: 6.6 g/dL (ref 6.0–8.5)

## 2020-02-04 LAB — LIPID PANEL
Chol/HDL Ratio: 2.3 ratio (ref 0.0–5.0)
Cholesterol, Total: 170 mg/dL (ref 100–199)
HDL: 73 mg/dL (ref >39)
LDL Chol Calc (NIH): 82 mg/dL (ref 0–99)
Triglycerides: 83 mg/dL (ref 0–149)
VLDL Cholesterol Cal: 15 mg/dL (ref 5–40)

## 2020-02-04 LAB — CBC WITH DIFFERENTIAL/PLATELET
Basophils Absolute: 0.1 10*3/uL (ref 0.0–0.2)
Basos: 1 %
EOS (ABSOLUTE): 0.1 10*3/uL (ref 0.0–0.4)
Eos: 1 %
Hematocrit: 44.6 % (ref 37.5–51.0)
Hemoglobin: 15.2 g/dL (ref 13.0–17.7)
Immature Grans (Abs): 0.1 10*3/uL (ref 0.0–0.1)
Immature Granulocytes: 1 %
Lymphocytes Absolute: 1.3 10*3/uL (ref 0.7–3.1)
Lymphs: 16 %
MCH: 31.5 pg (ref 26.6–33.0)
MCHC: 34.1 g/dL (ref 31.5–35.7)
MCV: 92 fL (ref 79–97)
Monocytes Absolute: 0.9 10*3/uL (ref 0.1–0.9)
Monocytes: 10 %
Neutrophils Absolute: 6 10*3/uL (ref 1.4–7.0)
Neutrophils: 71 %
Platelets: 229 10*3/uL (ref 150–450)
RBC: 4.83 x10E6/uL (ref 4.14–5.80)
RDW: 13.4 % (ref 11.6–15.4)
WBC: 8.4 10*3/uL (ref 3.4–10.8)

## 2020-02-04 LAB — T4, FREE: Free T4: 1.19 ng/dL (ref 0.82–1.77)

## 2020-02-04 LAB — HEMOGLOBIN A1C
Est. average glucose Bld gHb Est-mCnc: 126 mg/dL
Hgb A1c MFr Bld: 6 % — ABNORMAL HIGH (ref 4.8–5.6)

## 2020-02-04 LAB — TSH: TSH: 2.11 u[IU]/mL (ref 0.450–4.500)

## 2020-02-04 LAB — INSULIN, RANDOM: INSULIN: 17.2 u[IU]/mL (ref 2.6–24.9)

## 2020-02-04 LAB — VITAMIN D 25 HYDROXY (VIT D DEFICIENCY, FRACTURES): Vit D, 25-Hydroxy: 26.1 ng/mL — ABNORMAL LOW (ref 30.0–100.0)

## 2020-02-04 LAB — VITAMIN B12: Vitamin B-12: 397 pg/mL (ref 232–1245)

## 2020-02-04 LAB — T3: T3, Total: 130 ng/dL (ref 71–180)

## 2020-02-04 LAB — FOLATE: Folate: 7 ng/mL (ref >3.0)

## 2020-02-04 NOTE — Progress Notes (Signed)
Chief Complaint:   OBESITY Travis Huff (MR# [id number]) is a 60 y.o. male who presents for evaluation and treatment of obesity and related comorbidities. Current BMI is Body mass index is 48.89 kg/m. Travis Huff has been struggling with his weight for many years and has been unsuccessful in either losing weight, maintaining weight loss, or reaching his healthy weight goal.  Travis Huff is currently in the action stage of change and ready to dedicate time achieving and maintaining a healthier weight. Travis Huff is interested in becoming our patient and working on intensive lifestyle modifications including (but not limited to) diet and exercise for weight loss.  Travis Huff lives with his wife, Travis Huff, who is an Therapist, sports at [employer].  He skips breakfast often.  Snacks at night on chips and sweets/cookies.  Limited physical ability due to significant bilateral lower extremity neuropathy secondary to back surgeries.  Travis Huff's habits were reviewed today and are as follows: His family eats meals together, his desired weight loss is 110 pounds, he has been heavy most of his life, he started gaining weight in 1993, his heaviest weight ever was 335 pounds, he craves snacks, sweets, and chips, he snacks frequently in the evenings, he skips breakfast frequently, he is frequently drinking liquids with calories, he frequently makes poor food choices, he frequently eats larger portions than normal and he struggles with emotional eating.  This is the patient's first visit at [facility].  The patient's NEW PATIENT PACKET that they filled out prior to today's office visit was reviewed at length and information from that paperwork was included within the following office visit note.    Included in the packet: current and past health history, medications, allergies, ROS, gynecologic history (women only), surgical history, family history, social history, weight history, weight loss surgery history (for those that have had weight  loss surgery), nutritional evaluation, mood and food questionnaire along with a depression screening (PHQ9) on all patients, an Epworth questionnaire, sleep habits questionnaire, patient life and health improvement goals questionnaire. These will all be scanned into the patient's chart under media.   During the visit, I independently reviewed the patient's EKG, bioimpedance scale results, and indirect calorimeter results. I used this information to tailor a meal plan for the patient that will help Travis Huff to lose weight and will improve his obesity-related conditions going forward.  I performed a medically necessary appropriate examination and/or evaluation. I discussed the assessment and treatment plan with the patient. The patient was provided an opportunity to ask questions and all were answered. The patient agreed with the plan and demonstrated an understanding of the instructions. Labs were ordered today (unless patient declined them) and will be reviewed with the patient at our next visit unless more critical results need to be addressed immediately. Clinical information was updated and documented in the EMR.  Time spent on visit including pre-visit chart review and post-visit care was estimated to be 50+ minutes.  A separate 15 minutes was spent on risk counseling (see above/below).   Depression Screen Travis Huff's Food and Mood (modified PHQ-9) score was 17.  Depression screen PHQ 2/9 02/03/2020  Decreased Interest 3  Down, Depressed, Hopeless 2  PHQ - 2 Score 5  Altered sleeping 0  Tired, decreased energy 3  Change in appetite 1  Feeling bad or failure about yourself  3  Trouble concentrating 2  Moving slowly or fidgety/restless 2  Suicidal thoughts 1  PHQ-9 Score 17  Difficult doing work/chores Somewhat difficult  Some recent  data might be hidden   Assessment/Plan:   1. Other fatigue Travis Huff denies daytime somnolence and denies waking up still tired. Patent has a history of symptoms of  snoring. [name] generally gets 8 hours of sleep per night, and states that he has generally restful sleep. Snoring is present. Apneic episodes are not present. Epworth Sleepiness Score is 7.  [name] does feel that his weight is causing his energy to be lower than it should be. Fatigue may be related to obesity, depression or many other causes. Labs will be ordered, and in the meanwhile, Travis Huff will focus on self care including making healthy food choices, increasing physical activity and focusing on stress reduction.  Will check labs today, as per below.  - Vitamin B12 - Folate - T3 - T4, free - TSH - VITAMIN D 25 Hydroxy (Vit-D Deficiency, Fractures)  2. SOBOE (shortness of breath on exertion) Travis Huff notes increasing shortness of breath with exercising and seems to be worsening over time with weight gain. He notes getting out of breath sooner with activity than he used to. This has gotten worse recently. Travis Huff denies shortness of breath at rest or orthopnea.  Travis Huff does feel that he gets out of breath more easily that he used to when he exercises. Travis Huff's shortness of breath appears to be obesity related and exercise induced. He has agreed to work on weight loss and gradually increase exercise to treat his exercise induced shortness of breath. Will continue to monitor closely.  Will check labs today, as per below.  - Vitamin B12 - CBC with Differential/Platelet - Comprehensive metabolic panel - Folate - T3 - T4, free - TSH - VITAMIN D 25 Hydroxy (Vit-D Deficiency, Fractures)  3. Essential hypertension Elevated today.  Medications: metoprolol 200 mg daily and lisinopril-HCTZ 20-25 mg daily.   Plan:  Blood pressure is elevated today.  Home blood pressure monitoring is recommended and follow-up with Cardiology sooner than planned if not at goal.  Avoid buying foods that are: processed, frozen, or prepackaged to avoid excess salt. We will continue to monitor symptoms as they relate to his weight  loss journey.  Will check labs today, as per below.  BP Readings from Last 3 Encounters:  02/03/20 (!) 153/78  12/29/19 134/70  12/23/19 131/85   - Vitamin B12 - CBC with Differential/Platelet - Comprehensive metabolic panel - Folate - T3 - T4, free - TSH - VITAMIN D 25 Hydroxy (Vit-D Deficiency, Fractures)  4. Prediabetes A1c was 5.9 ~2 years ago.  Goal is HgbA1c < 5.7.  Medication: None.   Plan:  He will continue to focus on protein-rich, low simple carbohydrate foods. We reviewed the importance of hydration, regular exercise for stress reduction, and restorative sleep.  Will check labs today, as per below.  - Vitamin B12 - CBC with Differential/Platelet - Comprehensive metabolic panel - Folate - Hemoglobin A1c - Insulin, random - T3 - T4, free - TSH - VITAMIN D 25 Hydroxy (Vit-D Deficiency, Fractures)  5. Atrial fibrillation, unspecified type ([place]) With DVT and NSTEMI.  Followed by Dr. Tamala Julian and Dr. Curt Bears of Cardiology.  He takes Eliquis 5 mg twice daily, Multaq 400 mg daily, and diltiazem 120 mg daily.  6. Other hyperlipidemia Lipid-lowering medications: Lipitor 80 mg daily.   Plan: Dietary changes: Increase soluble fiber. Decrease simple carbohydrates. Exercise changes: An average 40 minutes of moderate to vigorous-intensity aerobic activity 3 or 4 times per week.  Will check lipid panel today, as per below.   - Lipid  panel  7. Mood disorder ([place]), with emotional eating With anxiety and PHQ-9 of 17.  Denies SI.  Denies depressed mood.  Travis Huff endorses sleep difficulties.  Travis Huff is taking Cymbalta 60 mg daily and Ambien 10 mg as needed for sleep, per PCP.  Plan:  Behavior modification techniques were discussed today to help Travis Huff deal with his emotional/non-hunger eating behaviors.  Orders and follow up as documented in patient record.   8. At risk for heart disease Due to Travis Huff's current state of health and medical condition(s), he is at a higher risk for heart  disease.  This puts the patient at much greater risk to subsequently develop cardiopulmonary conditions that can significantly affect patient's quality of life in a negative manner.    At least 24 minutes were spent on counseling Travis Huff about these concerns today, and I stressed the importance of reversing risks factors of obesity, especially truncal and visceral fat, hypertension, hyperlipidemia, and pre-diabetes.  The initial goal is to lose at least 5-10% of starting weight to help reduce these risk factors.  Counseling:  Intensive lifestyle modifications were discussed with As[place] as the most appropriate first line of treatment.  he will continue to work on diet, exercise, and weight loss efforts.  We will continue to reassess these conditions on a fairly regular basis in an attempt to decrease the patient's overall morbidity and mortality.  Evidence-based interventions for health behavior change were utilized today including the discussion of self monitoring techniques, problem-solving barriers, and SMART goal setting techniques.  Specifically, regarding patient's less desirable eating habits and patterns, we employed the technique of small changes when Travis Huff has not been able to fully commit to his prudent nutritional plan.  9. Class 3 severe obesity with serious comorbidity and body mass index (BMI) of 45.0 to 49.9 in adult, unspecified obesity type (HCC)  Travis Huff is currently in the action stage of change and his goal is to continue with weight loss efforts. I recommend Travis Huff begin the structured treatment plan as follows:  He has agreed to the Category 4 Plan.  Exercise goals: As tolerated.   Behavioral modification strategies: increasing lean protein intake, decreasing simple carbohydrates, increasing water intake, no skipping meals, meal planning and cooking strategies, keeping healthy foods in the home and avoiding temptations.  He was informed of the importance of frequent follow-up visits  to maximize his success with intensive lifestyle modifications for his multiple health conditions. He was informed we would discuss his lab results at his next visit unless there is a critical issue that needs to be addressed sooner. Travis Huff agreed to keep his next visit at the agreed upon time to discuss these results.  Objective:   Blood pressure (!) 153/78, pulse 80, temperature 98 F (36.7 C), height 5\' 11"  (1.803 m), weight (!) 350 lb 8.5 oz (159 kg), SpO2 97 %. Body mass index is 48.89 kg/m.  Indirect Calorimeter completed today shows a VO2 of 392 and a REE of 2727.    General: Cooperative, alert, well developed, in no acute distress. HEENT: Conjunctivae and lids unremarkable. Cardiovascular: Regular rhythm.  Lungs: Normal work of breathing. Neurologic: No focal deficits.   Lab Results  Component Value Date   HGBA1C 5.6 06/19/2018   HGBA1C 5.9 (H) 04/17/2017   HGBA1C  12/14/2009    5.5 (NOTE)  According to the ADA Clinical Practice Recommendations for 2011, when HbA1c is used as a screening test:   >=6.5%   Diagnostic of Diabetes Mellitus           (if abnormal result  is confirmed)  5.7-6.4%   Increased risk of developing Diabetes Mellitus  References:Diagnosis and Classification of Diabetes Mellitus,Diabetes JGGE,3662,94(TMLYY 1):S62-S69 and Standards of Medical Care in         Diabetes - 2011,Diabetes TKPT,4656,81  (Suppl 1):S11-S61.   Attestation Statements:   Reviewed by clinician on day of visit: allergies, medications, problem list, medical history, surgical history, family history, social history, and previous encounter notes.  I, Water quality scientist, CMA, am acting as Location manager for [employer], DO.  I have reviewed the above documentation for accuracy and completeness, and I agree with the above. Travis Huff, D.O.  The [place] was signed into law in 2016 which includes the  topic of electronic health records.  This provides immediate access to information in MyChart.  This includes consultation notes, operative notes, office notes, lab results and pathology reports.  If you have any questions about what you read please let us know at your next visit so we can discuss your concerns and take corrective action if need be.  We are right here with you.

## 2020-02-05 ENCOUNTER — Encounter (INDEPENDENT_AMBULATORY_CARE_PROVIDER_SITE_OTHER): Payer: Self-pay | Admitting: Family Medicine

## 2020-02-09 ENCOUNTER — Other Ambulatory Visit: Payer: Self-pay | Admitting: Sports Medicine

## 2020-02-09 MED FILL — ELIQUIS 5 MG TABLET: 5 | 30 days supply | Qty: 60 | Fill #4

## 2020-02-10 ENCOUNTER — Other Ambulatory Visit: Payer: Self-pay | Admitting: Sports Medicine

## 2020-02-10 ENCOUNTER — Ambulatory Visit: Payer: 59 | Admitting: Sports Medicine

## 2020-02-10 ENCOUNTER — Other Ambulatory Visit: Payer: Self-pay

## 2020-02-10 DIAGNOSIS — S8265XA Nondisplaced fracture of lateral malleolus of left fibula, initial encounter for closed fracture: Secondary | ICD-10-CM

## 2020-02-10 DIAGNOSIS — I10 Essential (primary) hypertension: Secondary | ICD-10-CM | POA: Diagnosis not present

## 2020-02-10 MED ORDER — LISINOPRIL-HYDROCHLOROTHIAZIDE 20-25 MG PO TABS: 1.0000 | ORAL_TABLET | Freq: Every day | ORAL | 3 refills | Status: DC

## 2020-02-10 MED ORDER — METOPROLOL SUCCINATE ER 200 MG PO TB24
200.0000 mg | ORAL_TABLET | Freq: Every morning | ORAL | 3 refills | Status: DC
Start: 2020-02-10 — End: 2020-03-25

## 2020-02-10 NOTE — Progress Notes (Signed)
    Procedures performed today:    None.  Independent interpretation of notes and tests performed by another provider:   None.  Brief History, Exam, Impression, and Recommendations:    Closed fracture of fibula, lateral malleolus, left Travis Huff suffered a small avulsion fracture from his lateral malleolus on the left, this was about a month ago, we kept him in a boot, he self discontinued the boot and is feeling pretty good, his ankle exam is completely unremarkable with the exception of a bit of swelling compared to the contralateral side. He has good strength, good motion, and then we can discontinue the boot, I will even think he needs a lace up brace at this point. Return as needed.    ___________________________________________ Travis Huff. Travis Huff, M.D., ABFM., CAQSM. Primary Care and [place] Instructor of [place] of [facility] of Medicine

## 2020-02-10 NOTE — Assessment & Plan Note (Signed)
Travis Huff suffered a small avulsion fracture from his lateral malleolus on the left, this was about a month ago, we kept him in a boot, he self discontinued the boot and is feeling pretty good, his ankle exam is completely unremarkable with the exception of a bit of swelling compared to the contralateral side. He has good strength, good motion, and then we can discontinue the boot, I will even think he needs a lace up brace at this point. Return as needed.

## 2020-02-17 ENCOUNTER — Telehealth (INDEPENDENT_AMBULATORY_CARE_PROVIDER_SITE_OTHER): Payer: 59 | Admitting: Family Medicine

## 2020-02-17 ENCOUNTER — Other Ambulatory Visit: Payer: Self-pay

## 2020-02-17 ENCOUNTER — Encounter (INDEPENDENT_AMBULATORY_CARE_PROVIDER_SITE_OTHER): Payer: Self-pay | Admitting: Family Medicine

## 2020-02-17 ENCOUNTER — Other Ambulatory Visit (INDEPENDENT_AMBULATORY_CARE_PROVIDER_SITE_OTHER): Payer: Self-pay | Admitting: Family Medicine

## 2020-02-17 DIAGNOSIS — E7849 Other hyperlipidemia: Secondary | ICD-10-CM

## 2020-02-17 DIAGNOSIS — E559 Vitamin D deficiency, unspecified: Secondary | ICD-10-CM | POA: Diagnosis not present

## 2020-02-17 DIAGNOSIS — E785 Hyperlipidemia, unspecified: Secondary | ICD-10-CM

## 2020-02-17 DIAGNOSIS — Z9189 Other specified personal risk factors, not elsewhere classified: Secondary | ICD-10-CM

## 2020-02-17 DIAGNOSIS — I10 Essential (primary) hypertension: Secondary | ICD-10-CM | POA: Diagnosis not present

## 2020-02-17 DIAGNOSIS — Z6841 Body Mass Index (BMI) 40.0 and over, adult: Secondary | ICD-10-CM | POA: Diagnosis not present

## 2020-02-17 DIAGNOSIS — R7303 Prediabetes: Secondary | ICD-10-CM | POA: Diagnosis not present

## 2020-02-17 MED ORDER — VITAMIN D (ERGOCALCIFEROL) 1.25 MG (50000 UNIT) PO CAPS
50000.0000 [IU] | ORAL_CAPSULE | ORAL | 0 refills | Status: DC
Start: 2020-02-17 — End: 2020-03-22

## 2020-02-17 MED FILL — MULTAQ 400 MG TABLET: 400 | 30 days supply | Qty: 60 | Fill #1

## 2020-02-17 MED FILL — VIT D2 1.25 MG (50,000 UNIT: 1.25 MG | 28 days supply | Qty: 4 | Fill #0

## 2020-02-17 MED FILL — CYCLOBENZAPRINE HCL 10 MG T: 10 | 90 days supply | Qty: 270 | Fill #0

## 2020-02-23 ENCOUNTER — Telehealth: Payer: Self-pay

## 2020-02-23 ENCOUNTER — Other Ambulatory Visit: Payer: Self-pay | Admitting: Cardiology

## 2020-02-23 NOTE — Telephone Encounter (Signed)
-----   Message from Silverio Decamp, MD sent at 02/20/2020  2:01 PM EST ----- Just tell him to pay cash.  Its cheap everywhere.  https://www.goodrx.com/cyclobenzaprine ___________________________________________ Gwen Her. Dianah Field, M.D., ABFM., CAQSM. Primary Care and [place] Instructor of [place] of [facility]   ----- Message ----- From: Dema Severin, [place] Sent: 02/20/2020  12:52 PM EST To: Silverio Decamp, MD  I'm trying to do a PA on his cyclobenzaprine. They are bucking it due to the quantity. Can you offer some "medical rationale" for why he needs this amount of medication so I can try to get this approved for him.   Thanks,  Nicolasa Milbrath

## 2020-02-23 NOTE — Telephone Encounter (Signed)
Left message for a return call from patient.  

## 2020-02-24 ENCOUNTER — Other Ambulatory Visit (HOSPITAL_COMMUNITY): Payer: Self-pay | Admitting: Physician Assistant

## 2020-02-24 ENCOUNTER — Other Ambulatory Visit (HOSPITAL_COMMUNITY): Payer: Self-pay

## 2020-02-24 MED ORDER — DILTIAZEM HCL ER COATED BEADS 120 MG PO CP24: 120.0000 mg | ORAL_CAPSULE | Freq: Every day | ORAL | 3 refills | Status: DC

## 2020-02-24 MED FILL — ELIQUIS 5 MG TABLET: 5 | 90 days supply | Qty: 180 | Fill #0

## 2020-02-24 NOTE — Progress Notes (Signed)
TeleHealth Visit:  Due to the COVID-19 pandemic, this visit was completed with telemedicine (audio/video) technology to reduce patient and provider exposure as well as to preserve personal protective equipment.   Travis Huff has verbally consented to this TeleHealth visit. The patient is located at home, the provider is located at the [facility] office. The participants in this visit include the listed provider and patient and. The visit was conducted today via [place].  OBESITY Travis Huff is here to discuss his progress with his obesity treatment plan along with follow-up of his obesity related diagnoses.   Today's visit was #: 2 Starting weight: 350 lbs Starting date: 02/03/2020 Today's date: 02/17/2020  Interim History:   Travis Huff is here today to review his NEW Meal Plan and to discuss all recent labs done here and/ or done at outside facilities. This is patient's first follow up visit. Extended time was spent counseling Travis Huff on all new disease processes that were discovered or that are worsening.   [place] denies hunger or cravings.  He says he has been drinking a lot more water and is using [place] for flavor.  For snacks, 2 beers per day and 2 string cheese on most days.  [place] bars as well.  Current Meal Plan: the Category 2 Plan with breakfast options for 90% of the time.  Current Exercise Plan: None at this time.  Assessment/Plan:   1. Prediabetes Worsening.  Discussed labs with patient today. Goal is HgbA1c < 5.7.  Medication:  None.    Plan:  Extensive diagnosis counseling done.  Consider medication to aid in blood sugar regulation and also weight loss in the future.  Continue prudent nutritional plan and weight loss.  He will continue to focus on protein-rich, low simple carbohydrate foods. We reviewed the importance of hydration, regular exercise for stress reduction, and restorative sleep.   Lab Results  Component Value Date   HGBA1C 6.0 (H)  02/03/2020   Lab Results  Component Value Date   INSULIN 17.2 02/03/2020   2. Other hyperlipidemia Discussed labs with patient today.  Course: Controlled. Lipid-lowering medications: Lipitor 80 mg daily.   Plan:  Cholesterol panel at goal.  Continue medication.  Extensive counseling performed regarding saturated fats, trans fats, and the effects of exercise.  Dietary changes: Increase soluble fiber, decrease simple carbohydrates, decrease saturated fat. Exercise changes: Moderate to vigorous-intensity aerobic activity 150 minutes per week or as tolerated. We will continue to monitor along with PCP/specialists as it pertains to his weight loss journey.  Lab Results  Component Value Date   CHOL 170 02/03/2020   HDL 73 02/03/2020   LDLCALC 82 02/03/2020   TRIG 83 02/03/2020   CHOLHDL 2.3 02/03/2020   Lab Results  Component Value Date   ALT 34 02/03/2020   AST 39 02/03/2020   ALKPHOS 149 (H) 02/03/2020   BILITOT 0.3 02/03/2020   3. Hypertension, unspecified type Discussed labs with patient today.  Medications: Cartia 120 mg daily, lisinopril-HCTZ 20-25 mg daily, Toprol-XL 200 mg daily.  Also with a-fib and on Eliquis 5 mg twice daily.  Denies symptoms or concerns today.  Blood pressure is within normal limits at home.  Plan:  Continue medications per Cardiology.  Home blood pressure monitoring recommended to goal per Cardiology.  Increase water intake and decrease salt intake.  Increase exercise.  BP Readings from Last 3 Encounters:  02/03/20 (!) 153/78  12/29/19 134/70  12/23/19 131/85   Lab Results  Component Value Date   CREATININE 0.93 02/03/2020   4. Vitamin D deficiency New.  Discussed labs with patient today.  Vitamin D level is not at goal.  Current vitamin D is 26.1, tested on 02/03/2020. Optimal goal > 50 ng/dL.   Plan: - Discussed importance of vitamin D to their health and well-being.  - possible symptoms of low Vitamin D can be low energy, depressed mood, muscle  aches, joint aches, osteoporosis etc. - low Vitamin D levels may be linked to an increased risk of cardiovascular events and even increased risk of cancers- such as colon and breast.  - I recommend pt take a weekly prescription vit D - see script below   - Informed patient this may be a lifelong thing, and he was encouraged to continue to take the medicine until told otherwise.   - we will need to monitor levels regularly (every 3-4 mo on average) to keep levels within normal limits.  - weight loss will likely improve availability of vitamin D, thus encouraged [place] to continue with meal plan and their weight loss efforts to further improve this condition - pt's questions and concerns regarding this condition addressed.  - Start Vitamin D, Ergocalciferol, (DRISDOL) 1.25 MG (50000 UNIT) CAPS capsule; Take 1 capsule (50,000 Units total) by mouth every 7 (seven) days.  Dispense: 4 capsule; Refill: 0  5. Class 3 severe obesity with serious comorbidity and body mass index (BMI) of 45.0 to 49.9 in adult, unspecified obesity type ([place])  Course: Travis Huff is currently in the action stage of change. As such, his goal is to continue with weight loss efforts.   Nutrition goals: He has agreed to the Category 4 Plan with breakfast options.   Exercise goals: As is.  Behavioral modification strategies: increasing lean protein intake, decreasing simple carbohydrates, increasing water intake, decreasing sodium intake, no skipping meals, meal planning and cooking strategies, keeping healthy foods in the home, ways to avoid boredom eating, better snacking choices and planning for success.  Travis Huff has agreed to follow-up with our clinic in 2-3 weeks. He was informed of the importance of frequent follow-up visits to maximize his success with intensive lifestyle modifications for his multiple health conditions.   Objective:   General: Cooperative, alert, well developed, in no acute distress. HEENT: Conjunctivae and  lids unremarkable. Cardiovascular: Regular rhythm.  Lungs: Normal work of breathing. Neurologic: No focal deficits.   Lab Results  Component Value Date   CREATININE 0.93 02/03/2020   BUN 17 02/03/2020   NA 140 02/03/2020   K 4.4 02/03/2020   CL 98 02/03/2020   CO2 25 02/03/2020   Lab Results  Component Value Date   ALT 34 02/03/2020   AST 39 02/03/2020   ALKPHOS 149 (H) 02/03/2020   BILITOT 0.3 02/03/2020   Lab Results  Component Value Date   HGBA1C 6.0 (H) 02/03/2020   HGBA1C 5.6 06/19/2018   HGBA1C 5.9 (H) 04/17/2017   HGBA1C  12/14/2009    5.5 (NOTE)                                                                       According to the ADA Clinical Practice Recommendations for 2011, when HbA1c is used as a screening test:   >=  6.5%   Diagnostic of Diabetes Mellitus           (if abnormal result  is confirmed)  5.7-6.4%   Increased risk of developing Diabetes Mellitus  References:Diagnosis and Classification of Diabetes Mellitus,Diabetes QVZD,6387,56(EPPIR 1):S62-S69 and Standards of Medical Care in         Diabetes - 2011,Diabetes Care,2011,34  (Suppl 1):S11-S61.   Lab Results  Component Value Date   INSULIN 17.2 02/03/2020   Lab Results  Component Value Date   TSH 2.110 02/03/2020   Lab Results  Component Value Date   CHOL 170 02/03/2020   HDL 73 02/03/2020   LDLCALC 82 02/03/2020   TRIG 83 02/03/2020   CHOLHDL 2.3 02/03/2020   Lab Results  Component Value Date   WBC 8.4 02/03/2020   HGB 15.2 02/03/2020   HCT 44.6 02/03/2020   MCV 92 02/03/2020   PLT 229 02/03/2020   Attestation Statements:   Reviewed by clinician on day of visit: allergies, medications, problem list, medical history, surgical history, family history, social history, and previous encounter notes.  I, Water quality scientist, CMA, am acting as Location manager for [employer], DO.  I have reviewed the above documentation for accuracy and completeness, and I agree with the above. Marjory Sneddon, D.O.  The [place] was signed into law in 2016 which includes the topic of electronic health records.  This provides immediate access to information in MyChart.  This includes consultation notes, operative notes, office notes, lab results and pathology reports.  If you have any questions about what you read please let us know at your next visit so we can discuss your concerns and take corrective action if need be.  We are right here with you.

## 2020-02-24 NOTE — Telephone Encounter (Signed)
Eliquis 5mg  refill request received. Patient is 60 years old, weight-159kg, Crea-0.93 on 02/03/2020, Diagnosis-Afib, and last seen by Dr. Curt Bears on 12/29/2019. Dose is appropriate based on dosing criteria. Will send in refill to requested pharmacy.

## 2020-02-26 MED FILL — CARTIA XT 120 MG CP24: 120 | 30 days supply | Qty: 30 | Fill #3

## 2020-03-08 ENCOUNTER — Ambulatory Visit (INDEPENDENT_AMBULATORY_CARE_PROVIDER_SITE_OTHER): Payer: 59 | Admitting: Family Medicine

## 2020-03-08 ENCOUNTER — Encounter (INDEPENDENT_AMBULATORY_CARE_PROVIDER_SITE_OTHER): Payer: Self-pay | Admitting: Family Medicine

## 2020-03-08 ENCOUNTER — Other Ambulatory Visit: Payer: Self-pay

## 2020-03-08 VITALS — BP 130/74 | HR 76 | Temp 98.6°F | Ht 71.0 in | Wt 321.0 lb

## 2020-03-08 DIAGNOSIS — E559 Vitamin D deficiency, unspecified: Secondary | ICD-10-CM

## 2020-03-08 DIAGNOSIS — Z6841 Body Mass Index (BMI) 40.0 and over, adult: Secondary | ICD-10-CM | POA: Diagnosis not present

## 2020-03-08 DIAGNOSIS — I10 Essential (primary) hypertension: Secondary | ICD-10-CM

## 2020-03-08 DIAGNOSIS — Z9189 Other specified personal risk factors, not elsewhere classified: Secondary | ICD-10-CM | POA: Insufficient documentation

## 2020-03-10 NOTE — Progress Notes (Signed)
Chief Complaint:   OBESITY Travis Huff is here to discuss his progress with his obesity treatment plan along with follow-up of his obesity related diagnoses.   Today's visit was #: 3 Starting weight: 350 lbs Starting date: 02/03/2020 Today's weight: 321 lbs Today's date: 03/08/2020 Total lbs lost to date: 29 lbs Body mass index is 44.77 kg/m.  Total weight loss percentage to date: -8.29%  Interim History:  Travis Huff says the plan has been easy to follow.  Denies hunger and cravings.  Loves it!  He is drinking 7+ bottles of water per day.  Not skipping meals.  He is having 2 Miller Lite beers every night and uses his snack calories for this.  Current Meal Plan: the Category 4 Plan with breakfast options for 98% of the time.  Current Exercise Plan: None.  Assessment/Plan:   No orders of the defined types were placed in this encounter.   Medications Discontinued During This Encounter  Medication Reason  . doxycycline (VIBRAMYCIN) 100 MG capsule      Meds ordered this encounter  Medications  . Vitamin D, Ergocalciferol, (DRISDOL) 1.25 MG (50000 UNIT) CAPS capsule    Sig: Take 1 capsule (50,000 Units total) by mouth every 7 (seven) days.    Dispense:  4 capsule    Refill:  0     1. Essential hypertension At goal. Medications: diltiazem 120 mg daily, lisinopril-HCTZ 20-25 mg daily, metoprolol 200 mg daily.   Plan:  Avoid buying foods that are: processed, frozen, or prepackaged to avoid excess salt. Ambulatory blood pressure monitoring was encouraged with a goal of at least 2-3 times weekly or when feeling poorly.  He was instructed to keep a log for [place] to review at each office visit.   Blood pressure is at goal currently.  Continue prudent nutritional plan and weight loss.  BP Readings from Last 3 Encounters:  03/22/20 114/78  03/08/20 130/74  02/03/20 (!) 153/78   Lab Results  Component Value Date   CREATININE 0.93 02/03/2020   2. Vitamin D deficiency Not at goal. Current  vitamin D is 26.1, tested on 02/03/2020. Optimal goal > 50 ng/dL.  He is taking vitamin D 50,000 IU weekly.  Plan:  Refill and Continue to take prescription Vitamin D @50 ,000 IU every week as prescribed.  Follow-up for routine testing of Vitamin D, at least 2-3 times per year to avoid over-replacement.  Will refill vitamin D 50,000 IU weekly today.  3. At risk for diabetes mellitus - [place] was given diabetes prevention education and counseling today of more than 8 minutes.  - Counseled patient on pathophysiology of disease and meaning/ implication of lab results.  - Reviewed how certain foods can either stimulate or inhibit insulin release, and subsequently affect hunger pathways  - Importance of following a healthy meal plan with limiting amounts of simple carbohydrates discussed with patient - Effects of regular aerobic exercise on blood sugar regulation reviewed and encouraged an eventual goal of 30 min 5d/week or more as a minimum.  - Briefly discussed treatment options, which always include dietary and lifestyle modification as first line.   - Handouts provided at patient's desire and/or told to go online to the American Diabetes Association website for further information.  4. Class 3 severe obesity with serious comorbidity and body mass index (BMI) of 40.0 to 44.9 in adult, unspecified obesity type ([place])  Course: Travis Huff is currently in the action stage of change. As such, his goal is to continue with  weight loss efforts.   Nutrition goals: He has agreed to the Category 4 Plan with breakfast options.   Exercise goals: As is.  Behavioral modification strategies: increasing lean protein intake, decreasing simple carbohydrates, meal planning and cooking strategies and planning for success.  [place] has agreed to follow-up with our clinic in 2-3 weeks. He was informed of the importance of frequent follow-up visits to maximize his success with intensive lifestyle modifications for his multiple  health conditions.   Objective:   Blood pressure 130/74, pulse 76, temperature 98.6 F (37 C), height 5\' 11"  (1.803 m), weight (!) 321 lb (145.6 kg), SpO2 96 %. Body mass index is 44.77 kg/m.  General: Cooperative, alert, well developed, in no acute distress. HEENT: Conjunctivae and lids unremarkable. Cardiovascular: Regular rhythm.  Lungs: Normal work of breathing. Neurologic: No focal deficits.   Lab Results  Component Value Date   CREATININE 0.93 02/03/2020   BUN 17 02/03/2020   NA 140 02/03/2020   K 4.4 02/03/2020   CL 98 02/03/2020   CO2 25 02/03/2020   Lab Results  Component Value Date   ALT 34 02/03/2020   AST 39 02/03/2020   ALKPHOS 149 (H) 02/03/2020   BILITOT 0.3 02/03/2020   Lab Results  Component Value Date   HGBA1C 6.0 (H) 02/03/2020   HGBA1C 5.6 06/19/2018   HGBA1C 5.9 (H) 04/17/2017   HGBA1C  12/14/2009    5.5 (NOTE)                                                                       According to the ADA Clinical Practice Recommendations for 2011, when HbA1c is used as a screening test:   >=6.5%   Diagnostic of Diabetes Mellitus           (if abnormal result  is confirmed)  5.7-6.4%   Increased risk of developing Diabetes Mellitus  References:Diagnosis and Classification of Diabetes Mellitus,Diabetes LOVF,6433,29(JJOAC 1):S62-S69 and Standards of Medical Care in         Diabetes - 2011,Diabetes Care,2011,34  (Suppl 1):S11-S61.   Lab Results  Component Value Date   INSULIN 17.2 02/03/2020   Lab Results  Component Value Date   TSH 2.110 02/03/2020   Lab Results  Component Value Date   CHOL 170 02/03/2020   HDL 73 02/03/2020   LDLCALC 82 02/03/2020   TRIG 83 02/03/2020   CHOLHDL 2.3 02/03/2020   Lab Results  Component Value Date   WBC 8.4 02/03/2020   HGB 15.2 02/03/2020   HCT 44.6 02/03/2020   MCV 92 02/03/2020   PLT 229 02/03/2020   Attestation Statements:   Reviewed by clinician on day of visit: allergies, medications, problem list,  medical history, surgical history, family history, social history, and previous encounter notes.  I, Water quality scientist, CMA, am acting as Location manager for [employer], DO.  I have reviewed the above documentation for accuracy and completeness, and I agree with the above. Marjory Sneddon, D.O.  The [place] was signed into law in 2016 which includes the topic of electronic health records.  This provides immediate access to information in MyChart.  This includes consultation notes, operative notes, office notes, lab results and pathology reports.  If  you have any questions about what you read please let us know at your next visit so we can discuss your concerns and take corrective action if need be.  We are right here with you.

## 2020-03-18 ENCOUNTER — Other Ambulatory Visit (HOSPITAL_COMMUNITY): Payer: Self-pay | Admitting: *Deleted

## 2020-03-18 ENCOUNTER — Other Ambulatory Visit (HOSPITAL_COMMUNITY): Payer: Self-pay | Admitting: Physician Assistant

## 2020-03-18 MED ORDER — MULTAQ 400 MG PO TABS: 400.0000 mg | ORAL_TABLET | Freq: Two times a day (BID) | ORAL | 1 refills | Status: DC

## 2020-03-22 ENCOUNTER — Ambulatory Visit (INDEPENDENT_AMBULATORY_CARE_PROVIDER_SITE_OTHER): Payer: 59 | Admitting: Family Medicine

## 2020-03-22 ENCOUNTER — Encounter (INDEPENDENT_AMBULATORY_CARE_PROVIDER_SITE_OTHER): Payer: Self-pay | Admitting: Family Medicine

## 2020-03-22 ENCOUNTER — Other Ambulatory Visit: Payer: Self-pay

## 2020-03-22 ENCOUNTER — Other Ambulatory Visit (INDEPENDENT_AMBULATORY_CARE_PROVIDER_SITE_OTHER): Payer: Self-pay | Admitting: Family Medicine

## 2020-03-22 VITALS — BP 114/78 | HR 90 | Temp 97.9°F | Ht 71.0 in | Wt 316.0 lb

## 2020-03-22 DIAGNOSIS — Z6841 Body Mass Index (BMI) 40.0 and over, adult: Secondary | ICD-10-CM | POA: Diagnosis not present

## 2020-03-22 DIAGNOSIS — Z9189 Other specified personal risk factors, not elsewhere classified: Secondary | ICD-10-CM

## 2020-03-22 DIAGNOSIS — E559 Vitamin D deficiency, unspecified: Secondary | ICD-10-CM

## 2020-03-22 DIAGNOSIS — Z8679 Personal history of other diseases of the circulatory system: Secondary | ICD-10-CM | POA: Diagnosis not present

## 2020-03-22 DIAGNOSIS — I10 Essential (primary) hypertension: Secondary | ICD-10-CM | POA: Diagnosis not present

## 2020-03-22 MED ORDER — VITAMIN D (ERGOCALCIFEROL) 1.25 MG (50000 UNIT) PO CAPS: 50000.0000 [IU] | ORAL_CAPSULE | ORAL | 0 refills | Status: DC

## 2020-03-22 MED FILL — VIT D2 1.25 MG (50,000 UNIT: 1.25 MG | 28 days supply | Qty: 4 | Fill #0

## 2020-03-23 ENCOUNTER — Other Ambulatory Visit (INDEPENDENT_AMBULATORY_CARE_PROVIDER_SITE_OTHER): Payer: Self-pay | Admitting: Family Medicine

## 2020-03-23 ENCOUNTER — Telehealth: Payer: Self-pay | Admitting: Interventional Cardiology

## 2020-03-23 DIAGNOSIS — Z8679 Personal history of other diseases of the circulatory system: Secondary | ICD-10-CM | POA: Insufficient documentation

## 2020-03-23 DIAGNOSIS — Z9189 Other specified personal risk factors, not elsewhere classified: Secondary | ICD-10-CM | POA: Insufficient documentation

## 2020-03-23 MED ORDER — VITAMIN D (ERGOCALCIFEROL) 1.25 MG (50000 UNIT) PO CAPS: 50000.0000 [IU] | ORAL_CAPSULE | ORAL | 0 refills | Status: DC

## 2020-03-23 NOTE — Telephone Encounter (Signed)
Pt c/o BP issue: STAT if pt c/o blurred vision, one-sided weakness or slurred speech  1. What are your last 5 BP readings? Yesterday 109/72, 110/74, today 93/63  2. Are you having any other symptoms (ex. Dizziness, headache, blurred vision, passed out)? Dizziness and blurred vision but not having symptoms now  3. What is your BP issue? Patient states his BP is usually high, but is has been low and he has been feeling "wiped out". He states he doe get dizzy and has had tunnel vision, but does not have it now. He states he also have been having nausea and was dry heaving last Friday. He states this is a symptom he has when he is in afib. He states he is not sure if his symptoms are from afib or his BP.

## 2020-03-23 NOTE — Telephone Encounter (Signed)
Work in with ep app to discuss.

## 2020-03-23 NOTE — Telephone Encounter (Signed)
Spoke with pt and scheduled him to come in and see Tommye Standard, PA-C on Thursday.  Pt agreeable to plan.

## 2020-03-23 NOTE — Telephone Encounter (Signed)
Pt states his BP has been running low for about a month now.  Has lost 34lbs with the weight loss clinic.  Yesterday BP was 109/72 and 110/74.  Today around 12:45pm it was 93/63.  States HR is always 60-75.  Pt mentioned that he wife was trying to feel his HR on Sunday to see if it was irregular but was unable to really feel it.  One day last week, at the grocery store, developed tunnel vision but did not pass out.  Last Friday developed nausea and dry heaves.  Pt states this happens when he's in AF and he usually converts when he starts dry heaving.  Becomes dizzy with hypotension.  Denies any CP, SOB or other symptoms.  Currently taking Diltiazem 120mg  QHS, Metoprolol Succinate 200mg  QAM and Lisinopril/HCTZ 20/25mg  QAM.  Advised I will send message to Dr. Tamala Julian and Dr. Curt Bears to see if we feel this is more BP related vs possible AF since nausea and dry heaving are typical for him when in AF.  Pt appreciative for call.

## 2020-03-25 ENCOUNTER — Ambulatory Visit: Payer: 59 | Admitting: Physician Assistant

## 2020-03-25 ENCOUNTER — Encounter: Payer: Self-pay | Admitting: Physician Assistant

## 2020-03-25 ENCOUNTER — Other Ambulatory Visit: Payer: Self-pay

## 2020-03-25 VITALS — BP 90/62 | HR 75 | Ht 71.0 in | Wt 320.0 lb

## 2020-03-25 DIAGNOSIS — I952 Hypotension due to drugs: Secondary | ICD-10-CM

## 2020-03-25 DIAGNOSIS — I255 Ischemic cardiomyopathy: Secondary | ICD-10-CM

## 2020-03-25 DIAGNOSIS — I480 Paroxysmal atrial fibrillation: Secondary | ICD-10-CM | POA: Diagnosis not present

## 2020-03-25 DIAGNOSIS — I2510 Atherosclerotic heart disease of native coronary artery without angina pectoris: Secondary | ICD-10-CM | POA: Diagnosis not present

## 2020-03-25 DIAGNOSIS — I10 Essential (primary) hypertension: Secondary | ICD-10-CM | POA: Diagnosis not present

## 2020-03-25 MED ORDER — METOPROLOL SUCCINATE ER 100 MG PO TB24
100.0000 mg | ORAL_TABLET | Freq: Every morning | ORAL | 3 refills | Status: DC
  Filled 2020-04-09: qty 90, 90d supply, fill #0

## 2020-03-25 MED FILL — CARTIA XT 120 MG CP24: 120 | 30 days supply | Qty: 30 | Fill #4

## 2020-03-25 NOTE — Progress Notes (Signed)
Cardiology Office Note Date:  03/25/2020  Patient ID:  [name], Travis Huff 04, 1962, MRN [id number] PCP:  Silverio Decamp, MD  Cardiologist:  Dr. Tamala Julian Electrophysiologist: Dr. Curt Bears    Chief Complaint: symptoms of AF  History of Present Illness: Travis Huff is a 60 y.o. male with history of CAD (NSTEMI prob/suspect CA dissection), HTN, recurrent PE's (s/p IVC filter), ICM, chronic CHF (combined) with recovered LVEF, AFib  He saw Dr. Tamala Julian last Sep 2021, prior to shoulder surgery.  Recommended sleep study.    He comes today to be seen for Dr. Curt Bears, last saw him Dec 2021, was doing well, no changes were made.  Following with weight and wellness.  Called with symptoms of weakness, lightheaded , near syncope, some lower BPs and uncertain if in/out of AF. Has lost weight of late internationally   TODAY He is accompanied by his wife. He reports generally feeling tired with some weak spells. Home BP have been low, 86/63 >> 109/72 One day he felt particularly poorly, his wife had him sit and took his BP got 157/108 (HR 53) and just a few minutes later 96/63 (81bpm) Eventually felt improved, he felt like he Huff have been in Afib No syncope, but has had some times he has felt like he might. No CP, no palpitations His AFib symptom is fatigue, weakness No unusual SOB, DOE  He is actively working and being successful (he reports about 25lbs so far) at weight loss, mentions his provider there mentions he needed to start keeping an eye on his BP wa a bit low and his meds as he loses weight Huff need to reduce his medicines  No bleeding or signs of bleeding    AFib hx Diagnosed Jun 2020 in setting of NSTEMI  AAD Multaq started Huff 2021   Past Medical History:  Diagnosis Date  . Arrhythmia   . Arthritis   . Asthma    as child  . Atrial fibrillation ([place])   . Avascular necrosis of bone of left hip ([place])   . Back pain   . Balance problem   . Blood clot in  vein 06/2016   x 2 no bleeding disorders found after back surgery  . Complication of anesthesia    ketamine hallucinations  . Constipation   . Coronary artery disease   . Dislocated hip, left, initial encounter ([place]) 10/03/2018  . Dysrhythmia 2020   a-fib  . Hx of blood clots   . Hypertension   . Joint pain   . Neuromuscular disorder ([place])    neuropathy bi lat legs knee down  . Neuropathy   . PAF (paroxysmal atrial fibrillation) ([place])   . Prediabetes   . Shakes   . SOBOE (shortness of breath on exertion)   . Swelling of both lower extremities   . Viral pericarditis 20 yrs ago    Past Surgical History:  Procedure Laterality Date  . ABDOMINAL EXPOSURE N/A 06/23/2016   Procedure: ABDOMINAL EXPOSURE;  Surgeon: Rosetta Posner, MD;  Location: [facility];  Service: Vascular;  Laterality: N/A;  . ANTERIOR LATERAL LUMBAR FUSION 4 LEVELS Right 06/23/2016   Procedure: Right  Lumbar two-three Lumbar three-four Lumbar four-five Anterolateral lumbar interbody fusion;  Surgeon: Erline Levine, MD;  Location: [facility];  Service: Neurosurgery;  Laterality: Right;  . ANTERIOR LUMBAR FUSION N/A 06/23/2016   Procedure: Lumbar five -sacral one  Anterior lumbar interbody fusion with Dr. Sherren Mocha Early for approach;  Surgeon: Erline Levine, MD;  Location: [facility];  Service: Neurosurgery;  Laterality: N/A;  . APPLICATION OF INTRAOPERATIVE CT SCAN N/A 06/26/2016   Procedure: APPLICATION OF INTRAOPERATIVE CT SCAN;  Surgeon: Erline Levine, MD;  Location: [facility];  Service: Neurosurgery;  Laterality: N/A;  . BACK SURGERY    . CARDIAC CATHETERIZATION    . CARDIOVASCULAR STRESS TEST     Negative in Huff 2014  . COLONOSCOPY    . HERNIA REPAIR     umbilical  . HIP CLOSED REDUCTION Left 10/03/2018   Procedure: CLOSED REDUCTION HIP;  Surgeon: Rod Can, MD;  Location: [facility];  Service: Orthopedics;  Laterality: Left;  . IR IVC FILTER PLMT / S&I Burke Keels GUID/MOD SED  03/26/2018  . IR RADIOLOGIST EVAL & MGMT  04/16/2019  . JOINT  REPLACEMENT    . LEFT HEART CATH AND CORONARY ANGIOGRAPHY N/A 06/21/2018   Procedure: LEFT HEART CATH AND CORONARY ANGIOGRAPHY;  Surgeon: Jettie Booze, MD;  Location: [facility];  Service: Cardiovascular;  Laterality: N/A;  . POSTERIOR LUMBAR FUSION 4 LEVEL N/A 06/26/2016   Procedure: Thoracic ten to Pelvis fixation with AIRO;  Surgeon: Erline Levine, MD;  Location: [facility];  Service: Neurosurgery;  Laterality: N/A;  . REVERSE SHOULDER ARTHROPLASTY Right 09/18/2019   Procedure: REVERSE SHOULDER ARTHROPLASTY;  Surgeon: Justice Britain, MD;  Location: [facility];  Service: Orthopedics;  Laterality: Right;  15min  . TENDON REPAIR     right hand  . TOTAL HIP ARTHROPLASTY Right 05/25/2014   Procedure: RIGHT TOTAL HIP ARTHROPLASTY ANTERIOR APPROACH;  Surgeon: Rod Can, MD;  Location: [facility];  Service: Orthopedics;  Laterality: Right;  . TOTAL HIP ARTHROPLASTY Left 03/27/2018   Procedure: TOTAL HIP ARTHROPLASTY ANTERIOR APPROACH- DA complex;  Surgeon: Rod Can, MD;  Location: [facility];  Service: Orthopedics;  Laterality: Left;  . TOTAL KNEE ARTHROPLASTY Bilateral     Current Outpatient Medications  Medication Sig Dispense Refill  . acetaminophen (TYLENOL) 325 MG tablet Take 650 mg by mouth every 6 (six) hours as needed for mild pain or headache.    [name] atorvastatin (LIPITOR) 80 MG tablet Take 1 tablet (80 mg total) by mouth daily at 6 PM. 90 tablet 2  . cyclobenzaprine (FLEXERIL) 10 MG tablet TAKE 2 TABLETS (20 MG TOTAL) BY MOUTH 2 (TWO) TIMES DAILY. 360 tablet 3  . diltiazem (CARTIA XT) 120 MG 24 hr capsule Take 1 capsule (120 mg total) by mouth daily. 90 capsule 3  . docusate sodium (COLACE) 100 MG capsule Take 1 capsule (100 mg total) by mouth 2 (two) times daily. 60 capsule 1  . dronedarone (MULTAQ) 400 MG tablet Take 1 tablet (400 mg total) by mouth 2 (two) times daily with a meal. 180 tablet 1  . DULoxetine (CYMBALTA) 60 MG capsule TAKE 1 CAPSULE (60 MG TOTAL) BY MOUTH AT BEDTIME.  90 capsule 3  . ELIQUIS 5 MG TABS tablet TAKE 1 TABLET BY MOUTH TWICE DAILY 180 tablet 2  . lisinopril-hydrochlorothiazide (ZESTORETIC) 20-25 MG tablet Take 1 tablet by mouth daily. 90 tablet 3  . nitroGLYCERIN (NITROSTAT) 0.4 MG SL tablet Place 1 tablet (0.4 mg total) under the tongue every 5 (five) minutes x 3 doses as needed for chest pain. If taking third dose, call 911. 25 tablet 3  . Vitamin D, Ergocalciferol, (DRISDOL) 1.25 MG (50000 UNIT) CAPS capsule Take 1 capsule (50,000 Units total) by mouth every 7 (seven) days. 4 capsule 0  . zolpidem (AMBIEN) 10 MG tablet TAKE 1 TABLET BY MOUTH EVERY  NIGHT AT BEDTIME AS NEEDED FOR SLEEP 90 tablet 1  . metoprolol (TOPROL-XL) 200 MG 24 hr tablet Take 0.5 tablets (100 mg total) by mouth every morning. Take with or immediately following a meal. 90 tablet 3   No current facility-administered medications for this visit.    Allergies:   Ketamine and Morphine and related   Social History:  The patient  reports that he quit smoking about 22 years ago. His smoking use included cigarettes. He has a 25.00 pack-year smoking history. His smokeless tobacco use includes snuff. He reports current alcohol use of about 14.0 standard drinks of alcohol per week. He reports that he does not use drugs.   Family History:  The patient's family history includes Healthy in his daughter and daughter. He was adopted.  ROS:  Please see the history of present illness.    All other systems are reviewed and otherwise negative.   PHYSICAL EXAM:  VS:  BP 90/62   Pulse 75   Ht 5\' 11"  (1.803 m)   Wt (!) 320 lb (145.2 kg)   SpO2 96%   BMI 44.63 kg/m  BMI: Body mass index is 44.63 kg/m. Well nourished, well developed, in no acute distress HEENT: normocephalic, atraumatic Neck: no JVD, carotid bruits or masses Cardiac:  RRR; no significant murmurs, no rubs, or gallops Lungs:  CTA b/l, no wheezing, rhonchi or rales Abd: soft, nontender, obese MS: no deformity or  atrophy Ext: no edema Skin: warm and dry, no rash Neuro:  No gross deficits appreciated Psych: euthymic mood, full affect    EKG:  Done today and reviewed by myself shows  SR 75bpm, no changes from prior  TTE 09/30/2018  1. Left ventricular ejection fraction, by visual estimation, is 65 to 70%. The left ventricle has normal function. Normal left ventricular size. There is mildly increased left ventricular hypertrophy. 2. Definity contrast agent was given IV to delineate the left ventricular endocardial borders. 3. Left ventricular diastolic Doppler parameters are indeterminate pattern of LV diastolic filling. 4. Global right ventricle has normal systolic function.The right ventricular size is normal. No increase in right ventricular wall thickness. 5. The left ventricular function has improved from previous echo  [facility] 06/21/18  Dist LAD lesion is 75% stenosed. 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  There is mild left ventricular systolic dysfunction.  The left ventricular ejection fraction is 35-45% by visual estimate.  There is no aortic valve stenosis.   Recent Labs: 02/03/2020: ALT 34; BUN 17; Creatinine, Ser 0.93; Hemoglobin 15.2; Platelets 229; Potassium 4.4; Sodium 140; TSH 2.110  02/03/2020: Chol/HDL Ratio 2.3; Cholesterol, Total 170; HDL 73; LDL Chol Calc (NIH) 82; Triglycerides 83   CrCl cannot be calculated (Patient's most recent lab result is older than the maximum 21 days allowed.).   Wt Readings from Last 3 Encounters:  03/25/20 (!) 320 lb (145.2 kg)  03/22/20 (!) 316 lb (143.3 kg)  03/08/20 (!) 321 lb (145.6 kg)     Other studies reviewed: Additional studies/records reviewed today include: summarized above  ASSESSMENT AND PLAN:  1. Paroxysmal Afib     CHA2DS2Vasc is 3, on Eliquis, appropriately dosed     Uncertain burden     SR today, continue Multaq  He will invest in a Kardia device   2. CAD     No anginal sounding symptoms      On statin, BB, no ASA w/ Eliquis     C.w Dr. Tamala Julian  3. HTN  Now with hypotension     Repeat manual BP today by myself 100/60     Is suspect his symptoms are hypotension, and his one high reading an oitlyer/inaccurate  Reduce his toprol in 1/2 to 100mg  daily He takes dilt at night, asked not to tale tonight of SBP <110 I discussed importance of adequate hydration He is perspiring heavily here today, not completely unusual for him he states (office in notable warm for me today as well)  I have asked then to monitor his BP daily and if routinely < 167 systolic to call.mychart message, likely to reduce/stop ace/HCTZ next if needed   Disposition: F/u with me in 2 weeks, sooner if needed, asked to bring his home cuff.  Current medicines are reviewed at length with the patient today.  The patient did not have any concerns regarding medicines.  Venetia Night, PA-C 03/25/2020 5:18 PM     [address] [place] [address] [phone] (office)  [phone] (fax)

## 2020-03-25 NOTE — Patient Instructions (Signed)
Medication Instructions:   START TAKING METOPROLOL 100 MG ONCE A DAY   *If you need a refill on your cardiac medications before your next appointment, please call your pharmacy*   Lab Work: NONE ORDERED  TODAY   If you have labs (blood work) drawn today and your tests are completely normal, you will receive your results only by: [name] MyChart Message (if you have MyChart) OR . A paper copy in the mail If you have any lab test that is abnormal or we need to change your treatment, we will call you to review the results.   Testing/Procedures: NONE ORDERED  TODAY    Follow-Up: At [facility], you and your health needs are our priority.  As part of our continuing mission to provide you with exceptional heart care, we have created designated Provider Care Teams.  These Care Teams include your primary Cardiologist (physician) and Advanced Practice Providers (APPs -  Physician Assistants and Nurse Practitioners) who all work together to provide you with the care you need, when you need it.  We recommend signing up for the patient portal called "MyChart".  Sign up information is provided on this After Visit Summary.  MyChart is used to connect with patients for Virtual Visits (Telemedicine).  Patients are able to view lab/test results, encounter notes, upcoming appointments, etc.  Non-urgent messages can be sent to your provider as well.   To learn more about what you can do with MyChart, go to [email or web address].    Your next appointment:   2 week(s)  The format for your next appointment:   In Person  Provider:   Tommye Standard, PA-C   Other Instructions   PLEASE BRING BLOOD PRESSURE LOG AND BLOOD PRESSURE CUFF TO NEXT VISIT

## 2020-03-29 NOTE — Progress Notes (Signed)
Chief Complaint:   OBESITY Travis Huff is here to discuss his progress with his obesity treatment plan along with follow-up of his obesity related diagnoses.   Today's visit was #: 4 Starting weight: 350 lbs Starting date: 02/03/2020 Today's weight: 316 lbs Today's date: 03/22/2020 Total lbs lost to date: 34 lbs Body mass index is 44.07 kg/m.  Total weight loss percentage to date: -9.71%  Interim History:  Travis Huff is here for a follow up office visit and he is following the meal plan without concerns or issues.  Patient's meal and food recall appears to be accurate and consistent with what is on the plan.  When on plan, his hunger and cravings are well controlled.    He is drinking six 16.9 ounce bottles of water per day.  Current Meal Plan: the Category 4 Plan with breakfast options for 95% of the time.  Current Exercise Plan: None.  Assessment/Plan:   1. Essential hypertension At goal. Medications: diltiazem 120 mg daily, Zestoretic 20-25 mg daily, metoprolol 100 mg daily.  Blood pressure 102/66 and then later 174/110, 96/69, 108/70.  He feels washed out and weak and thinks it is from blood pressures going low.  No new orthostatic symptoms.  Plan:  Medication management per Cardiology, especially since he already called them earlier today about possible dose change and symptoms are currently stable.  Avoid buying foods that are: processed, frozen, or prepackaged to avoid excess salt. We will continue to monitor closely alongside his PCP and/or Specialist.  Regular follow up with PCP and specialists was also encouraged.   BP Readings from Last 3 Encounters:  03/25/20 90/62  03/22/20 114/78  03/08/20 130/74   Lab Results  Component Value Date   CREATININE 0.93 02/03/2020   2. History of atrial fibrillation Per patient, can feel out of rhythm.  No symptoms or concerns about this today.  On Eliquis 5 mg twice daily, Multaq 400 mg twice daily and blood pressure medications  per Cardiology.  Plan:  Blood pressure medication changes from Cardiology due to significant heart issues.  3. Vitamin D deficiency Not at goal. Current vitamin D is 26.1, tested on 02/03/2020. Optimal goal > 50 ng/dL.  He is taking vitamin D 50,000 IU weekly.  Plan: Continue to take prescription Vitamin D @50 ,000 IU every week as prescribed.  Follow-up for routine testing of Vitamin D, at least 2-3 times per year to avoid over-replacement.  4. At risk for complication associated with hypotension Travis Huff was given approximately 9 minutes of education and counseling today regarding the condition of hypotension, the pathophysiology of the condition and concerns regarding prevention and treatment of this condition.  We discussed risks of medications used to treat hypertension, which in the setting of weight loss, can inadvertently cause hypotension.  Signs of hypotension such as feeling lightheaded or unsteady, esp when getting up first thing in the AM or off the toilet, were reviewed in detail and all questions were answered.  The use of motivational interviewing was employed as a technique as well today to aid in the treatment of patient's multiple conditions.  Pt understands importance of proper hydration and also of more prudent home BP monitoring while actively losing weight.  he will call us, or their PCP,  or other specialists who treat their BP with medications, with any questions or concerns that may develop.   5. Obesity, current BMI 44.07  Course: Travis Huff is currently in the action stage of change. As such, his  goal is to continue with weight loss efforts.   Nutrition goals: He has agreed to the Category 4 Plan.   Exercise goals: As is.  Behavioral modification strategies: meal planning and cooking strategies and planning for success.  Travis Huff has agreed to follow-up with our clinic in 2 weeks. He was informed of the importance of frequent follow-up visits to maximize his success with intensive  lifestyle modifications for his multiple health conditions.   Objective:   Blood pressure 114/78, pulse 90, temperature 97.9 F (36.6 C), height 5\' 11"  (1.803 m), weight (!) 316 lb (143.3 kg), SpO2 96 %. Body mass index is 44.07 kg/m.  General: Cooperative, alert, well developed, in no acute distress. HEENT: Conjunctivae and lids unremarkable. Cardiovascular: Regular rhythm.  Lungs: Normal work of breathing. Neurologic: No focal deficits.   Lab Results  Component Value Date   CREATININE 0.93 02/03/2020   BUN 17 02/03/2020   NA 140 02/03/2020   K 4.4 02/03/2020   CL 98 02/03/2020   CO2 25 02/03/2020   Lab Results  Component Value Date   ALT 34 02/03/2020   AST 39 02/03/2020   ALKPHOS 149 (H) 02/03/2020   BILITOT 0.3 02/03/2020   Lab Results  Component Value Date   HGBA1C 6.0 (H) 02/03/2020   HGBA1C 5.6 06/19/2018   HGBA1C 5.9 (H) 04/17/2017   HGBA1C  12/14/2009    5.5 (NOTE)                                                                       According to the ADA Clinical Practice Recommendations for 2011, when HbA1c is used as a screening test:   >=6.5%   Diagnostic of Diabetes Mellitus           (if abnormal result  is confirmed)  5.7-6.4%   Increased risk of developing Diabetes Mellitus  References:Diagnosis and Classification of Diabetes Mellitus,Diabetes IWLN,9892,11(HERDE 1):S62-S69 and Standards of Medical Care in         Diabetes - 2011,Diabetes Care,2011,34  (Suppl 1):S11-S61.   Lab Results  Component Value Date   INSULIN 17.2 02/03/2020   Lab Results  Component Value Date   TSH 2.110 02/03/2020   Lab Results  Component Value Date   CHOL 170 02/03/2020   HDL 73 02/03/2020   LDLCALC 82 02/03/2020   TRIG 83 02/03/2020   CHOLHDL 2.3 02/03/2020   Lab Results  Component Value Date   WBC 8.4 02/03/2020   HGB 15.2 02/03/2020   HCT 44.6 02/03/2020   MCV 92 02/03/2020   PLT 229 02/03/2020   Attestation Statements:   Reviewed by clinician on day of  visit: allergies, medications, problem list, medical history, surgical history, family history, social history, and previous encounter notes.  I, Water quality scientist, CMA, am acting as Location manager for [employer], DO.  I have reviewed the above documentation for accuracy and completeness, and I agree with the above. Marjory Sneddon, D.O.  The [place] was signed into law in 2016 which includes the topic of electronic health records.  This provides immediate access to information in MyChart.  This includes consultation notes, operative notes, office notes, lab results and pathology reports.  If you have any questions about  what you read please let us know at your next visit so we can discuss your concerns and take corrective action if need be.  We are right here with you.   Medications Discontinued During This Encounter  Medication Reason  . Vitamin D, Ergocalciferol, (DRISDOL) 1.25 MG (50000 UNIT) CAPS capsule Reorder     Meds ordered this encounter  Medications  . DISCONTD: Vitamin D, Ergocalciferol, (DRISDOL) 1.25 MG (50000 UNIT) CAPS capsule    Sig: Take 1 capsule (50,000 Units total) by mouth every 7 (seven) days.    Dispense:  4 capsule    Refill:  0

## 2020-04-03 ENCOUNTER — Other Ambulatory Visit (HOSPITAL_BASED_OUTPATIENT_CLINIC_OR_DEPARTMENT_OTHER): Payer: Self-pay

## 2020-04-04 ENCOUNTER — Other Ambulatory Visit (HOSPITAL_BASED_OUTPATIENT_CLINIC_OR_DEPARTMENT_OTHER): Payer: Self-pay

## 2020-04-04 ENCOUNTER — Other Ambulatory Visit (HOSPITAL_COMMUNITY): Payer: Self-pay

## 2020-04-04 ENCOUNTER — Other Ambulatory Visit: Payer: Self-pay

## 2020-04-05 ENCOUNTER — Ambulatory Visit (INDEPENDENT_AMBULATORY_CARE_PROVIDER_SITE_OTHER): Payer: 59 | Admitting: Family Medicine

## 2020-04-05 ENCOUNTER — Other Ambulatory Visit (HOSPITAL_BASED_OUTPATIENT_CLINIC_OR_DEPARTMENT_OTHER): Payer: Self-pay

## 2020-04-05 MED FILL — Lisinopril & Hydrochlorothiazide Tab 20-25 MG: ORAL | 90 days supply | Qty: 90 | Fill #0 | Status: AC

## 2020-04-06 DIAGNOSIS — Z96642 Presence of left artificial hip joint: Secondary | ICD-10-CM | POA: Diagnosis not present

## 2020-04-07 ENCOUNTER — Encounter (INDEPENDENT_AMBULATORY_CARE_PROVIDER_SITE_OTHER): Payer: Self-pay | Admitting: Family Medicine

## 2020-04-07 ENCOUNTER — Ambulatory Visit (INDEPENDENT_AMBULATORY_CARE_PROVIDER_SITE_OTHER): Payer: 59 | Admitting: Family Medicine

## 2020-04-07 ENCOUNTER — Other Ambulatory Visit (HOSPITAL_BASED_OUTPATIENT_CLINIC_OR_DEPARTMENT_OTHER): Payer: Self-pay

## 2020-04-07 ENCOUNTER — Other Ambulatory Visit: Payer: Self-pay

## 2020-04-07 ENCOUNTER — Other Ambulatory Visit (HOSPITAL_COMMUNITY): Payer: Self-pay

## 2020-04-07 VITALS — BP 123/76 | HR 74 | Temp 98.5°F | Ht 71.0 in | Wt 310.8 lb

## 2020-04-07 DIAGNOSIS — Z9189 Other specified personal risk factors, not elsewhere classified: Secondary | ICD-10-CM | POA: Diagnosis not present

## 2020-04-07 DIAGNOSIS — E559 Vitamin D deficiency, unspecified: Secondary | ICD-10-CM

## 2020-04-07 DIAGNOSIS — Z6841 Body Mass Index (BMI) 40.0 and over, adult: Secondary | ICD-10-CM

## 2020-04-07 DIAGNOSIS — I10 Essential (primary) hypertension: Secondary | ICD-10-CM | POA: Diagnosis not present

## 2020-04-07 MED ORDER — VITAMIN D (ERGOCALCIFEROL) 1.25 MG (50000 UNIT) PO CAPS
ORAL_CAPSULE | ORAL | 0 refills | Status: DC
  Filled 2020-04-07: qty 4, 28d supply, fill #0

## 2020-04-08 ENCOUNTER — Other Ambulatory Visit: Payer: Self-pay | Admitting: Sports Medicine

## 2020-04-08 ENCOUNTER — Other Ambulatory Visit: Payer: Self-pay | Admitting: Interventional Cardiology

## 2020-04-08 ENCOUNTER — Ambulatory Visit: Payer: 59 | Admitting: Physician Assistant

## 2020-04-08 ENCOUNTER — Other Ambulatory Visit (HOSPITAL_BASED_OUTPATIENT_CLINIC_OR_DEPARTMENT_OTHER): Payer: Self-pay

## 2020-04-08 VITALS — BP 138/84 | HR 82 | Ht 71.0 in | Wt 312.0 lb

## 2020-04-08 DIAGNOSIS — F411 Generalized anxiety disorder: Secondary | ICD-10-CM

## 2020-04-08 DIAGNOSIS — I480 Paroxysmal atrial fibrillation: Secondary | ICD-10-CM | POA: Diagnosis not present

## 2020-04-08 DIAGNOSIS — I2510 Atherosclerotic heart disease of native coronary artery without angina pectoris: Secondary | ICD-10-CM

## 2020-04-08 DIAGNOSIS — I10 Essential (primary) hypertension: Secondary | ICD-10-CM | POA: Diagnosis not present

## 2020-04-08 NOTE — Progress Notes (Signed)
Cardiology Office Note Date:  04/08/2020  Patient ID:  Travis Huff, Travis Huff Sep 16, 1960, MRN [id number] PCP:  Silverio Decamp, MD  Cardiologist:  Dr. Tamala Julian Electrophysiologist: Dr. Curt Bears    Chief Complaint: planned f/u  History of Present Illness: Travis Huff is a 60 y.o. male with history of CAD (NSTEMI prob/suspect CA dissection), HTN, recurrent PE's (s/p IVC filter), ICM, chronic CHF (combined) with recovered LVEF, AFib  He saw Dr. Tamala Julian last Sep 2021, prior to shoulder surgery.  Recommended sleep study.    He comes today to be seen for Dr. Curt Bears, last saw him Dec 2021, was doing well, no changes were made.  Following with weight and wellness.  Called with symptoms of weakness, lightheaded , near syncope, some lower BPs and uncertain if in/out of AF. Has lost weight of late internationally   I saw him 03/25/20 He is accompanied by his wife. He reports generally feeling tired with some weak spells. Home BP have been low, 86/63 >> 109/72 One day he felt particularly poorly, his wife had him sit and took his BP got 157/108 (HR 53) and just a few minutes later 96/63 (81bpm) Eventually felt improved, he felt like he may have been in Afib No syncope, but has had some times he has felt like he might. No CP, no palpitations His AFib symptom is fatigue, weakness No unusual SOB, DOE He is actively working and being successful (he reports about 25lbs so far) at weight loss, mentions his provider there mentions he needed to start keeping an eye on his BP wa a bit low and his meds as he loses weight may need to reduce his medicines No bleeding or signs of bleeding Suspect hypotension was his weakness, his Toprol reduced in 1/2, asked to monitor his BP and symptoms, with thoughts toward reduction of his ACE/HCTZ next. He planned to invest in a Kardia device to help identify if AFib was part of his symptoms.  Saw weight management team yesterday, BP was 123/76,  74bpm  TODAY Thinks things are a bit better He has been keeping an eye on his BP Once he thought he was in AFib, felt weak and nauseous, to the point of vomiting. The nausea component is something he associates with his AFib Occasionally he finds his BP low still, a couple very high. No near syncope or syncope. Some days weaker then others. No overt palpitations No CP, no SOB No bleeding or signs of bleeding  He reports so far total 37lbs weight loss by his visit this week at the weight loss center   AFib hx Diagnosed Jun 2020 in setting of NSTEMI  AAD Multaq started May 2021>> current   Past Medical History:  Diagnosis Date  . Arrhythmia   . Arthritis   . Asthma    as child  . Atrial fibrillation ([place])   . Avascular necrosis of bone of left hip ([place])   . Back pain   . Balance problem   . Blood clot in vein 06/2016   x 2 no bleeding disorders found after back surgery  . Complication of anesthesia    ketamine hallucinations  . Constipation   . Coronary artery disease   . Dislocated hip, left, initial encounter ([place]) 10/03/2018  . Dysrhythmia 2020   a-fib  . Hx of blood clots   . Hypertension   . Joint pain   . Neuromuscular disorder ([place])    neuropathy bi lat legs knee down  . Neuropathy   .  PAF (paroxysmal atrial fibrillation) ([place])   . Prediabetes   . Shakes   . SOBOE (shortness of breath on exertion)   . Swelling of both lower extremities   . Viral pericarditis 20 yrs ago    Past Surgical History:  Procedure Laterality Date  . ABDOMINAL EXPOSURE N/A 06/23/2016   Procedure: ABDOMINAL EXPOSURE;  Surgeon: Rosetta Posner, MD;  Location: [place];  Service: Vascular;  Laterality: N/A;  . ANTERIOR LATERAL LUMBAR FUSION 4 LEVELS Right 06/23/2016   Procedure: Right  Lumbar two-three Lumbar three-four Lumbar four-five Anterolateral lumbar interbody fusion;  Surgeon: Erline Levine, MD;  Location: [place];  Service: Neurosurgery;  Laterality: Right;  . ANTERIOR LUMBAR  FUSION N/A 06/23/2016   Procedure: Lumbar five -sacral one  Anterior lumbar interbody fusion with Dr. Sherren Mocha Early for approach;  Surgeon: Erline Levine, MD;  Location: [place];  Service: Neurosurgery;  Laterality: N/A;  . APPLICATION OF INTRAOPERATIVE CT SCAN N/A 06/26/2016   Procedure: APPLICATION OF INTRAOPERATIVE CT SCAN;  Surgeon: Erline Levine, MD;  Location: [place];  Service: Neurosurgery;  Laterality: N/A;  . BACK SURGERY    . CARDIAC CATHETERIZATION    . CARDIOVASCULAR STRESS TEST     Negative in May 2014  . COLONOSCOPY    . HERNIA REPAIR     umbilical  . HIP CLOSED REDUCTION Left 10/03/2018   Procedure: CLOSED REDUCTION HIP;  Surgeon: Rod Can, MD;  Location: [facility];  Service: Orthopedics;  Laterality: Left;  . IR IVC FILTER PLMT / S&I Burke Keels GUID/MOD SED  03/26/2018  . IR RADIOLOGIST EVAL & MGMT  04/16/2019  . JOINT REPLACEMENT    . LEFT HEART CATH AND CORONARY ANGIOGRAPHY N/A 06/21/2018   Procedure: LEFT HEART CATH AND CORONARY ANGIOGRAPHY;  Surgeon: Jettie Booze, MD;  Location: [facility];  Service: Cardiovascular;  Laterality: N/A;  . POSTERIOR LUMBAR FUSION 4 LEVEL N/A 06/26/2016   Procedure: Thoracic ten to Pelvis fixation with AIRO;  Surgeon: Erline Levine, MD;  Location: [facility];  Service: Neurosurgery;  Laterality: N/A;  . REVERSE SHOULDER ARTHROPLASTY Right 09/18/2019   Procedure: REVERSE SHOULDER ARTHROPLASTY;  Surgeon: Justice Britain, MD;  Location: [facility];  Service: Orthopedics;  Laterality: Right;  132min  . TENDON REPAIR     right hand  . TOTAL HIP ARTHROPLASTY Right 05/25/2014   Procedure: RIGHT TOTAL HIP ARTHROPLASTY ANTERIOR APPROACH;  Surgeon: Rod Can, MD;  Location: [place];  Service: Orthopedics;  Laterality: Right;  . TOTAL HIP ARTHROPLASTY Left 03/27/2018   Procedure: TOTAL HIP ARTHROPLASTY ANTERIOR APPROACH- DA complex;  Surgeon: Rod Can, MD;  Location[facility];  Service: Orthopedics;  Laterality: Left;  . TOTAL KNEE ARTHROPLASTY Bilateral      Current Outpatient Medications  Medication Sig Dispense Refill  . acetaminophen (TYLENOL) 325 MG tablet Take 650 mg by mouth every 6 (six) hours as needed for mild pain or headache.    [name] apixaban (ELIQUIS) 5 MG TABS tablet TAKE 1 TABLET BY MOUTH TWICE DAILY 180 tablet 2  . atorvastatin (LIPITOR) 80 MG tablet TAKE 1 TABLET BY MOUTH DAILY AT 6PM 90 tablet 1  . cyclobenzaprine (FLEXERIL) 10 MG tablet TAKE 2 TABLETS (20 MG TOTAL) BY MOUTH 2 (TWO) TIMES DAILY. 360 tablet 3  . diltiazem (CARDIZEM CD) 120 MG 24 hr capsule TAKE 1 CAPSULE (120 MG TOTAL) BY MOUTH DAILY. 90 capsule 3  . docusate sodium (COLACE) 100 MG capsule Take 1 capsule (100 mg total) by mouth 2 (two) times  daily. 60 capsule 1  . dronedarone (MULTAQ) 400 MG tablet TAKE 1 TABLET BY MOUTH TWICE DAILY WITH A MEAL 180 tablet 1  . DULoxetine (CYMBALTA) 60 MG capsule TAKE 1 CAPSULE (60 MG TOTAL) BY MOUTH AT BEDTIME. 90 capsule 3  . lisinopril-hydrochlorothiazide (ZESTORETIC) 20-25 MG tablet TAKE 1 TABLET BY MOUTH DAILY. 90 tablet 3  . metoprolol (TOPROL-XL) 200 MG 24 hr tablet Take 0.5 tablets (100 mg total) by mouth every morning. Take with or immediately following a meal. 90 tablet 3  . nitroGLYCERIN (NITROSTAT) 0.4 MG SL tablet Place 1 tablet (0.4 mg total) under the tongue every 5 (five) minutes x 3 doses as needed for chest pain. If taking third dose, call 911. 25 tablet 3  . Vitamin D, Ergocalciferol, (DRISDOL) 1.25 MG (50000 UNIT) CAPS capsule TAKE 1 CAPSULE (50,000 UNITS TOTAL) BY MOUTH EVERY 7 (SEVEN) DAYS. 4 capsule 0  . zolpidem (AMBIEN) 10 MG tablet TAKE 1 TABLET BY MOUTH EVERY NIGHT AT BEDTIME AS NEEDED FOR SLEEP (Patient taking differently: Take by mouth at bedtime as needed. for sleep) 90 tablet 1   No current facility-administered medications for this visit.    Allergies:   Ketamine and Morphine and related   Social History:  The patient  reports that he quit smoking about 22 years ago. His smoking use included  cigarettes. He has a 25.00 pack-year smoking history. His smokeless tobacco use includes snuff. He reports current alcohol use of about 14.0 standard drinks of alcohol per week. He reports that he does not use drugs.   Family History:  The patient's family history includes Healthy in his daughter and daughter. He was adopted.  ROS:  Please see the history of present illness.    All other systems are reviewed and otherwise negative.   PHYSICAL EXAM:  VS:  There were no vitals taken for this visit. BMI: There is no height or weight on file to calculate BMI. Well nourished, well developed, in no acute distress HEENT: normocephalic, atraumatic Neck: no JVD, carotid bruits or masses Cardiac:  RRR; no significant murmurs, no rubs, or gallops Lungs:  CTA b/l, no wheezing, rhonchi or rales Abd: soft, nontender, obese MS: no deformity or atrophy Ext: no edema Skin: warm and dry, no rash Neuro:  No gross deficits appreciated Psych: euthymic mood, full affect    EKG:  Not done today 03/25/20: SR 75bpm, no changes from prior  TTE 09/30/2018  1. Left ventricular ejection fraction, by visual estimation, is 65 to 70%. The left ventricle has normal function. Normal left ventricular size. There is mildly increased left ventricular hypertrophy. 2. Definity contrast agent was given IV to delineate the left ventricular endocardial borders. 3. Left ventricular diastolic Doppler parameters are indeterminate pattern of LV diastolic filling. 4. Global right ventricle has normal systolic function.The right ventricular size is normal. No increase in right ventricular wall thickness. 5. The left ventricular function has improved from previous echo  [facility] 06/21/18  Dist LAD lesion is 75% stenosed. 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  There is mild left ventricular systolic dysfunction.  The left ventricular ejection fraction is 35-45% by visual estimate.  There is no aortic valve  stenosis.   Recent Labs: 02/03/2020: ALT 34; BUN 17; Creatinine, Ser 0.93; Hemoglobin 15.2; Platelets 229; Potassium 4.4; Sodium 140; TSH 2.110  02/03/2020: Chol/HDL Ratio 2.3; Cholesterol, Total 170; HDL 73; LDL Chol Calc (NIH) 82; Triglycerides 83   CrCl cannot be calculated (Patient's most recent lab result  is older than the maximum 21 days allowed.).   Wt Readings from Last 3 Encounters:  04/07/20 (!) 310 lb 13.6 oz (141 kg)  03/25/20 (!) 320 lb (145.2 kg)  03/22/20 (!) 316 lb (143.3 kg)     Other studies reviewed: Additional studies/records reviewed today include: summarized above  ASSESSMENT AND PLAN:  1. Paroxysmal Afib     CHA2DS2Vasc is 3, on Eliquis, appropriately dosed     Uncertain burden     SR today, continue Multaq  He plans on ordering Kardia device today  Discussed knowing if his weak days/episodes are his AFib will make our next management choices much different. If he is having frequent AFib will need to consider alternative AAD   If it is not his AFib, might consider further reduction in his BP  HR 60's-80s by home record, the day he thought he may be in AF, BP 99/67, 100/78, HR 83 and 74  2. CAD     *No anginal sounding symptoms     On statin, BB, no ASA w/ Eliquis     C/w Dr. Tamala Julian  3. HTN     Now with hypotension, looks better after reduction in his Toprol  His BP cuff is checked here today and accurate  Home BPs largely 110's-120's/70's A couple out-lyer 174/014 and 210/110 I suspect are error readings  lowest 99/67  Disposition: will have him back in a month or so, sooner if needed.  As he is getting kardia tracings he will let us know  Current medicines are reviewed at length with the patient today.  The patient did not have any concerns regarding medicines.  Venetia Night, PA-C 04/08/2020 5:19 AM     [facility] [address] [place] [address] [phone] (office)  [phone] (fax)

## 2020-04-08 NOTE — Patient Instructions (Signed)
Medication Instructions:   Your physician recommends that you continue on your current medications as directed. Please refer to the Current Medication list given to you today.  *If you need a refill on your cardiac medications before your next appointment, please call your pharmacy*   Lab Work: [place]   If you have labs (blood work) drawn today and your tests are completely normal, you will receive your results only by: [name] MyChart Message (if you have MyChart) OR . A paper copy in the mail If you have any lab test that is abnormal or we need to change your treatment, we will call you to review the results.   Testing/Procedures: NONE ORDERED  TODAY   Follow-Up: At [facility], you and your health needs are our priority.  As part of our continuing mission to provide you with exceptional heart care, we have created designated Provider Care Teams.  These Care Teams include your primary Cardiologist (physician) and Advanced Practice Providers (APPs -  Physician Assistants and Nurse Practitioners) who all work together to provide you with the care you need, when you need it.  We recommend signing up for the patient portal called "MyChart".  Sign up information is provided on this After Visit Summary.  MyChart is used to connect with patients for Virtual Visits (Telemedicine).  Patients are able to view lab/test results, encounter notes, upcoming appointments, etc.  Non-urgent messages can be sent to your provider as well.   To learn more about what you can do with MyChart, go to [email or web address].    Your next appointment:   4-6  week(s)  The format for your next appointment:   In Person  Provider:   Allegra Lai, MD or Charlcie Cradle    Other Instructions

## 2020-04-09 ENCOUNTER — Other Ambulatory Visit (HOSPITAL_BASED_OUTPATIENT_CLINIC_OR_DEPARTMENT_OTHER): Payer: Self-pay

## 2020-04-09 MED ORDER — ATORVASTATIN CALCIUM 80 MG PO TABS
ORAL_TABLET | ORAL | 3 refills | Status: DC
  Filled 2020-04-09: qty 90, 90d supply, fill #0
  Filled 2020-07-07: qty 90, 90d supply, fill #1
  Filled 2020-10-10: qty 90, 90d supply, fill #2
  Filled 2021-01-12: qty 90, 90d supply, fill #3

## 2020-04-09 MED ORDER — METOPROLOL SUCCINATE ER 200 MG PO TB24
ORAL_TABLET | ORAL | 3 refills | Status: DC
  Filled 2020-04-09: qty 90, 90d supply, fill #0

## 2020-04-09 MED ORDER — DULOXETINE HCL 60 MG PO CPEP
ORAL_CAPSULE | ORAL | 3 refills | Status: DC
  Filled 2020-04-09: qty 90, 90d supply, fill #0
  Filled 2020-07-07: qty 90, 90d supply, fill #1
  Filled 2020-10-10: qty 90, 90d supply, fill #2
  Filled 2021-01-12: qty 90, 90d supply, fill #3

## 2020-04-12 ENCOUNTER — Other Ambulatory Visit: Payer: Self-pay

## 2020-04-12 ENCOUNTER — Other Ambulatory Visit (HOSPITAL_BASED_OUTPATIENT_CLINIC_OR_DEPARTMENT_OTHER): Payer: Self-pay

## 2020-04-12 ENCOUNTER — Other Ambulatory Visit: Payer: Self-pay | Admitting: Interventional Radiology

## 2020-04-12 DIAGNOSIS — Z95828 Presence of other vascular implants and grafts: Secondary | ICD-10-CM

## 2020-04-12 MED ORDER — METOPROLOL SUCCINATE ER 100 MG PO TB24
100.0000 mg | ORAL_TABLET | Freq: Every morning | ORAL | 3 refills | Status: DC
  Filled 2020-04-12 – 2020-06-28 (×2): qty 90, 90d supply, fill #0

## 2020-04-13 NOTE — Progress Notes (Signed)
Chief Complaint:   OBESITY Jasdeep is here to discuss his progress with his obesity treatment plan along with follow-up of his obesity related diagnoses.   Today's visit was #: 5 Starting weight: 350 lbs Starting date: 02/03/2020 Today's weight: 310 lbs Today's date: 04/07/2020 Total lbs lost to date: 40 lbs Body mass index is 43.35 kg/m.  Total weight loss percentage to date: -11.43%  Interim History:  Sesar is down 6 pounds from his last office visit.  Still weighing protein/measuring food.  Hunger and cravings are controlled and no concerns today except erratic blood pressures.  He is seeing Cardiology tomorrow.  Current Meal Plan: the Category 4 Plan 95% of the time.  Current Exercise Plan: None.  Assessment/Plan:   No orders of the defined types were placed in this encounter.   Medications Discontinued During This Encounter  Medication Reason  . influenza vac split quadrivalent PF (FLUARIX) 0.5 ML injection Error  . Vitamin D, Ergocalciferol, (DRISDOL) 1.25 MG (50000 UNIT) CAPS capsule Reorder     Meds ordered this encounter  Medications  . Vitamin D, Ergocalciferol, (DRISDOL) 1.25 MG (50000 UNIT) CAPS capsule    Sig: TAKE 1 CAPSULE (50,000 UNITS TOTAL) BY MOUTH EVERY 7 (SEVEN) DAYS.    Dispense:  4 capsule    Refill:  0     1. Essential hypertension At goal. Medications: diltiazem 120 mg daily, Multaq 400 mg daily, Zestoretic 20-25 mg daily, metoprolol 100 mg daily, Eliquis 5 mg twice daily.  Highest 126/78, lowest 99/66.  Recently seen by Cardiolgy and they decreased his Toprol by 1/2.  Plan:  Symptoms stable over the past few days.  Keep follow-up appointment with Cardiology tomorrow.  medication management per them.  Continue HBPM, obtain Kardia device at their recommendation.  Assume they will not decrease rate control medications but rather Zestoretic as next choice.  Avoid buying foods that are: processed, frozen, or prepackaged to avoid excess salt. We will  continue to monitor closely alongside his PCP and/or Specialist.  Regular follow up with PCP and specialists was also encouraged.   BP Readings from Last 3 Encounters:  04/08/20 138/84  04/07/20 123/76  03/25/20 90/62   Lab Results  Component Value Date   CREATININE 0.93 02/03/2020   2. Vitamin D deficiency Not at goal. Current vitamin D is 26.1, tested on 02/03/2020. Optimal goal > 50 ng/dL.  He is taking vitamin D 50,000 IU weekly.  Plan: Continue to take prescription Vitamin D @50 ,000 IU every week as prescribed.  Follow-up for routine testing of Vitamin D, at least 2-3 times per year to avoid over-replacement.  - Refill Vitamin D, Ergocalciferol, (DRISDOL) 1.25 MG (50000 UNIT) CAPS capsule; TAKE 1 CAPSULE (50,000 UNITS TOTAL) BY MOUTH EVERY 7 (SEVEN) DAYS.  Dispense: 4 capsule; Refill: 0  3. At risk for impaired metabolic function Due to Latasha's current state of health and medical condition(s), he is at a significantly higher risk for impaired metabolic function.   At least 9 minutes was spent on counseling Nhat about these concerns today.  This places the patient at a much greater risk to subsequently develop cardio-pulmonary conditions that can negatively affect the patient's quality of life.  I stressed the importance of reversing these risks factors.  The initial goal is to lose at least 5-10% of starting weight to help reduce risk factors.  Counseling:  Intensive lifestyle modifications discussed with Vince as the most appropriate first line treatment.  he will continue to work on  diet, exercise, and weight loss efforts.  We will continue to reassess these conditions on a fairly regular basis in an attempt to decrease the patient's overall morbidity and mortality.  4. Obesity with current BMI of 43.35  Course: Sanjuan is currently in the action stage of change. As such, his goal is to continue with weight loss efforts.   Nutrition goals: He has agreed to the Category 4 Plan.    Exercise goals: All adults should avoid inactivity. Some physical activity is better than none, and adults who participate in any amount of physical activity gain some health benefits.  Behavioral modification strategies: increasing lean protein intake, increasing water intake and decreasing sodium intake.  Benford has agreed to follow-up with our clinic in 2 weeks with an APP and in 4 weeks with me. He was informed of the importance of frequent follow-up visits to maximize his success with intensive lifestyle modifications for his multiple health conditions.   Objective:   Blood pressure 123/76, pulse 74, temperature 98.5 F (36.9 C), height 5\' 11"  (1.803 m), weight (!) 310 lb 13.6 oz (141 kg), SpO2 96 %. Body mass index is 43.35 kg/m.  General: Cooperative, alert, well developed, in no acute distress. HEENT: Conjunctivae and lids unremarkable. Cardiovascular: Regular rhythm.  Lungs: Normal work of breathing. Neurologic: No focal deficits.   Lab Results  Component Value Date   CREATININE 0.93 02/03/2020   BUN 17 02/03/2020   NA 140 02/03/2020   K 4.4 02/03/2020   CL 98 02/03/2020   CO2 25 02/03/2020   Lab Results  Component Value Date   ALT 34 02/03/2020   AST 39 02/03/2020   ALKPHOS 149 (H) 02/03/2020   BILITOT 0.3 02/03/2020   Lab Results  Component Value Date   HGBA1C 6.0 (H) 02/03/2020   HGBA1C 5.6 06/19/2018   HGBA1C 5.9 (H) 04/17/2017   HGBA1C  12/14/2009    5.5 (NOTE)                                                                       According to the ADA Clinical Practice Recommendations for 2011, when HbA1c is used as a screening test:   >=6.5%   Diagnostic of Diabetes Mellitus           (if abnormal result  is confirmed)  5.7-6.4%   Increased risk of developing Diabetes Mellitus  References:Diagnosis and Classification of Diabetes Mellitus,Diabetes JSHF,0263,78(HYIFO 1):S62-S69 and Standards of Medical Care in         Diabetes - 2011,Diabetes Care,2011,34   (Suppl 1):S11-S61.   Lab Results  Component Value Date   INSULIN 17.2 02/03/2020   Lab Results  Component Value Date   TSH 2.110 02/03/2020   Lab Results  Component Value Date   CHOL 170 02/03/2020   HDL 73 02/03/2020   LDLCALC 82 02/03/2020   TRIG 83 02/03/2020   CHOLHDL 2.3 02/03/2020   Lab Results  Component Value Date   WBC 8.4 02/03/2020   HGB 15.2 02/03/2020   HCT 44.6 02/03/2020   MCV 92 02/03/2020   PLT 229 02/03/2020   Attestation Statements:   Reviewed by clinician on day of visit: allergies, medications, problem list, medical history, surgical history, family history, social history, and  previous encounter notes.  I, Water quality scientist, CMA, am acting as Location manager for [employer], DO.  I have reviewed the above documentation for accuracy and completeness, and I agree with the above. Marjory Sneddon, D.O.  The [place] was signed into law in 2016 which includes the topic of electronic health records.  This provides immediate access to information in MyChart.  This includes consultation notes, operative notes, office notes, lab results and pathology reports.  If you have any questions about what you read please let us know at your next visit so we can discuss your concerns and take corrective action if need be.  We are right here with you.

## 2020-04-16 ENCOUNTER — Other Ambulatory Visit (HOSPITAL_BASED_OUTPATIENT_CLINIC_OR_DEPARTMENT_OTHER): Payer: Self-pay

## 2020-04-16 MED FILL — Cyclobenzaprine HCl Tab 10 MG: ORAL | 90 days supply | Qty: 360 | Fill #0 | Status: CN

## 2020-04-18 MED FILL — Zolpidem Tartrate Tab 10 MG: ORAL | 90 days supply | Qty: 90 | Fill #0 | Status: AC

## 2020-04-19 ENCOUNTER — Other Ambulatory Visit (HOSPITAL_BASED_OUTPATIENT_CLINIC_OR_DEPARTMENT_OTHER): Payer: Self-pay

## 2020-04-19 ENCOUNTER — Telehealth: Payer: Self-pay | Admitting: Physician Assistant

## 2020-04-19 NOTE — Telephone Encounter (Signed)
New message:    Patient calling stating that he sent her CARDIO reading. Patient states the doctor told him to call when he sent it so she would not throw it away.

## 2020-04-21 ENCOUNTER — Other Ambulatory Visit (HOSPITAL_BASED_OUTPATIENT_CLINIC_OR_DEPARTMENT_OTHER): Payer: Self-pay

## 2020-04-21 ENCOUNTER — Telehealth: Payer: Self-pay | Admitting: Sports Medicine

## 2020-04-21 ENCOUNTER — Encounter: Payer: Self-pay | Admitting: Sports Medicine

## 2020-04-21 DIAGNOSIS — Z96611 Presence of right artificial shoulder joint: Secondary | ICD-10-CM | POA: Diagnosis not present

## 2020-04-21 DIAGNOSIS — Z471 Aftercare following joint replacement surgery: Secondary | ICD-10-CM | POA: Diagnosis not present

## 2020-04-21 MED ORDER — TRAMADOL HCL 50 MG PO TABS
ORAL_TABLET | ORAL | 0 refills | Status: DC
  Filled 2020-04-21: qty 30, 7d supply, fill #0

## 2020-04-21 NOTE — Telephone Encounter (Signed)
Patient dropped off Handicap placard to be filled out, placed in provider box. AM

## 2020-04-21 NOTE — Telephone Encounter (Signed)
Done

## 2020-04-21 NOTE — Telephone Encounter (Signed)
Patient dropped off handicap placard to be filled out. AM

## 2020-04-22 ENCOUNTER — Other Ambulatory Visit (HOSPITAL_BASED_OUTPATIENT_CLINIC_OR_DEPARTMENT_OTHER): Payer: Self-pay

## 2020-04-22 NOTE — Telephone Encounter (Signed)
Ft message on VM that form was complete and at the front desk for pick up.

## 2020-04-23 MED FILL — Cyclobenzaprine HCl Tab 10 MG: ORAL | 90 days supply | Qty: 360 | Fill #0 | Status: CN

## 2020-04-26 ENCOUNTER — Other Ambulatory Visit (HOSPITAL_BASED_OUTPATIENT_CLINIC_OR_DEPARTMENT_OTHER): Payer: Self-pay

## 2020-04-27 ENCOUNTER — Other Ambulatory Visit (HOSPITAL_BASED_OUTPATIENT_CLINIC_OR_DEPARTMENT_OTHER): Payer: Self-pay

## 2020-04-29 ENCOUNTER — Other Ambulatory Visit (HOSPITAL_BASED_OUTPATIENT_CLINIC_OR_DEPARTMENT_OTHER): Payer: Self-pay

## 2020-04-29 ENCOUNTER — Other Ambulatory Visit: Payer: Self-pay

## 2020-04-29 ENCOUNTER — Ambulatory Visit (INDEPENDENT_AMBULATORY_CARE_PROVIDER_SITE_OTHER): Payer: 59 | Admitting: Physician Assistant

## 2020-04-29 ENCOUNTER — Encounter (INDEPENDENT_AMBULATORY_CARE_PROVIDER_SITE_OTHER): Payer: Self-pay | Admitting: Physician Assistant

## 2020-04-29 VITALS — BP 135/84 | HR 86 | Temp 97.8°F | Ht 71.0 in | Wt 303.0 lb

## 2020-04-29 DIAGNOSIS — Z9189 Other specified personal risk factors, not elsewhere classified: Secondary | ICD-10-CM

## 2020-04-29 DIAGNOSIS — I10 Essential (primary) hypertension: Secondary | ICD-10-CM | POA: Diagnosis not present

## 2020-04-29 DIAGNOSIS — Z6841 Body Mass Index (BMI) 40.0 and over, adult: Secondary | ICD-10-CM | POA: Diagnosis not present

## 2020-04-29 DIAGNOSIS — R7303 Prediabetes: Secondary | ICD-10-CM

## 2020-04-29 DIAGNOSIS — E66813 Obesity, class 3: Secondary | ICD-10-CM

## 2020-04-29 MED FILL — Cyclobenzaprine HCl Tab 10 MG: ORAL | 30 days supply | Qty: 120 | Fill #0 | Status: AC

## 2020-05-03 ENCOUNTER — Other Ambulatory Visit (HOSPITAL_BASED_OUTPATIENT_CLINIC_OR_DEPARTMENT_OTHER): Payer: Self-pay

## 2020-05-03 MED FILL — Diltiazem HCl Coated Beads Cap ER 24HR 120 MG: ORAL | 90 days supply | Qty: 90 | Fill #0 | Status: AC

## 2020-05-04 NOTE — Progress Notes (Signed)
Chief Complaint:   OBESITY Travis Huff is here to discuss his progress with his obesity treatment plan along with follow-up of his obesity related diagnoses. Travis Huff is on the Category 4 Plan and states he is following his eating plan approximately 90% of the time. Travis Huff states he is walking for 20 minutes 3 times per week.  Today's visit was #: 6 Starting weight: 350 lbs Starting date: 02/03/2020 Today's weight: 303 lbs Today's date: 04/29/2020 Total lbs lost to date: 11 Total lbs lost since last in-office visit: 7  Interim History: Travis Huff did very well with weight loss. He enjoys Category 4 and his hunger is controlled. He has no issues with the plan currently.  Subjective:   1. Essential hypertension Travis Huff is followed by Cardiology, and he had a recent visit. His blood pressure is better controlled after they decreased his metoprolol.  2. Pre-diabetes Travis Huff's last A1c was 6.0. He is not on medications, and he denies polyphagia.  3. At risk for diabetes mellitus Travis Huff is at higher than average risk for developing diabetes due to obesity.   Assessment/Plan:   1. Essential hypertension Travis Huff will follow up with Cardiology in a few weeks. He will continue working on healthy weight loss and exercise to improve blood pressure control. We will watch for signs of hypotension as he continues his lifestyle modifications.  2. Pre-diabetes Travis Huff will continue his meal plan, and will continue to work on weight loss, exercise, and decreasing simple carbohydrates to help decrease the risk of diabetes.   3. At risk for diabetes mellitus Travis Huff was given approximately 15 minutes of diabetes education and counseling today. We discussed intensive lifestyle modifications today with an emphasis on weight loss as well as increasing exercise and decreasing simple carbohydrates in his diet. We also reviewed medication options with an emphasis on risk versus benefit of those discussed.   Repetitive spaced  learning was employed today to elicit superior memory formation and behavioral change.  4. Class 3 severe obesity with serious comorbidity and body mass index (BMI) of 40.0 to 44.9 in adult, unspecified obesity type (HCC) Travis Huff is currently in the action stage of change. As such, his goal is to continue with weight loss efforts. He has agreed to the Category 4 Plan.   Exercise goals: As is.  Behavioral modification strategies: meal planning and cooking strategies and keeping healthy foods in the home.  Travis Huff has agreed to follow-up with our clinic in 3 weeks. He was informed of the importance of frequent follow-up visits to maximize his success with intensive lifestyle modifications for his multiple health conditions.   Objective:   Blood pressure 135/84, pulse 86, temperature 97.8 F (36.6 C), height 5\' 11"  (1.803 m), weight (!) 303 lb (137.4 kg), SpO2 97 %. Body mass index is 42.26 kg/m.  General: Cooperative, alert, well developed, in no acute distress. HEENT: Conjunctivae and lids unremarkable. Cardiovascular: Regular rhythm.  Lungs: Normal work of breathing. Neurologic: No focal deficits.   Lab Results  Component Value Date   CREATININE 0.93 02/03/2020   BUN 17 02/03/2020   NA 140 02/03/2020   K 4.4 02/03/2020   CL 98 02/03/2020   CO2 25 02/03/2020   Lab Results  Component Value Date   ALT 34 02/03/2020   AST 39 02/03/2020   ALKPHOS 149 (H) 02/03/2020   BILITOT 0.3 02/03/2020   Lab Results  Component Value Date   HGBA1C 6.0 (H) 02/03/2020   HGBA1C 5.6 06/19/2018   HGBA1C 5.9 (  H) 04/17/2017   HGBA1C  12/14/2009    5.5 (NOTE)                                                                       According to the ADA Clinical Practice Recommendations for 2011, when HbA1c is used as a screening test:   >=6.5%   Diagnostic of Diabetes Mellitus           (if abnormal result  is confirmed)  5.7-6.4%   Increased risk of developing Diabetes Mellitus  References:Diagnosis  and Classification of Diabetes Mellitus,Diabetes LGXQ,1194,17(EYCXK 1):S62-S69 and Standards of Medical Care in         Diabetes - 2011,Diabetes Care,2011,34  (Suppl 1):S11-S61.   Lab Results  Component Value Date   INSULIN 17.2 02/03/2020   Lab Results  Component Value Date   TSH 2.110 02/03/2020   Lab Results  Component Value Date   CHOL 170 02/03/2020   HDL 73 02/03/2020   LDLCALC 82 02/03/2020   TRIG 83 02/03/2020   CHOLHDL 2.3 02/03/2020   Lab Results  Component Value Date   WBC 8.4 02/03/2020   HGB 15.2 02/03/2020   HCT 44.6 02/03/2020   MCV 92 02/03/2020   PLT 229 02/03/2020   No results found for: IRON, TIBC, FERRITIN  Attestation Statements:   Reviewed by clinician on day of visit: allergies, medications, problem list, medical history, surgical history, family history, social history, and previous encounter notes.   Wilhemena Durie, am acting as transcriptionist for [employer], PA-C.  I have reviewed the above documentation for accuracy and completeness, and I agree with the above. Abby Potash, PA-C

## 2020-05-12 ENCOUNTER — Other Ambulatory Visit (HOSPITAL_BASED_OUTPATIENT_CLINIC_OR_DEPARTMENT_OTHER): Payer: Self-pay

## 2020-05-13 ENCOUNTER — Ambulatory Visit (INDEPENDENT_AMBULATORY_CARE_PROVIDER_SITE_OTHER): Payer: 59 | Admitting: Family Medicine

## 2020-05-13 ENCOUNTER — Other Ambulatory Visit (HOSPITAL_BASED_OUTPATIENT_CLINIC_OR_DEPARTMENT_OTHER): Payer: Self-pay

## 2020-05-13 ENCOUNTER — Other Ambulatory Visit: Payer: Self-pay

## 2020-05-13 ENCOUNTER — Encounter (INDEPENDENT_AMBULATORY_CARE_PROVIDER_SITE_OTHER): Payer: Self-pay | Admitting: Family Medicine

## 2020-05-13 VITALS — BP 116/69 | HR 68 | Temp 98.0°F | Ht 71.0 in | Wt 299.0 lb

## 2020-05-13 DIAGNOSIS — E559 Vitamin D deficiency, unspecified: Secondary | ICD-10-CM | POA: Diagnosis not present

## 2020-05-13 DIAGNOSIS — Z6841 Body Mass Index (BMI) 40.0 and over, adult: Secondary | ICD-10-CM

## 2020-05-13 DIAGNOSIS — Z9189 Other specified personal risk factors, not elsewhere classified: Secondary | ICD-10-CM

## 2020-05-13 MED ORDER — VITAMIN D (ERGOCALCIFEROL) 1.25 MG (50000 UNIT) PO CAPS
ORAL_CAPSULE | ORAL | 0 refills | Status: DC
  Filled 2020-05-13: qty 4, 28d supply, fill #0

## 2020-05-17 ENCOUNTER — Other Ambulatory Visit (HOSPITAL_BASED_OUTPATIENT_CLINIC_OR_DEPARTMENT_OTHER): Payer: Self-pay

## 2020-05-17 ENCOUNTER — Other Ambulatory Visit: Payer: Self-pay

## 2020-05-17 ENCOUNTER — Encounter: Payer: Self-pay | Admitting: Cardiology

## 2020-05-17 ENCOUNTER — Ambulatory Visit: Payer: 59 | Admitting: Cardiology

## 2020-05-17 VITALS — BP 146/98 | HR 62 | Ht 72.0 in | Wt 301.4 lb

## 2020-05-17 DIAGNOSIS — I480 Paroxysmal atrial fibrillation: Secondary | ICD-10-CM | POA: Diagnosis not present

## 2020-05-17 NOTE — Progress Notes (Signed)
Electrophysiology Office Note   Date:  05/17/2020   ID:  Travis Huff, DOB [birth year], MRN [id number]  PCP:  Travis Decamp, MD  Cardiologist: Travis Huff Primary Electrophysiologist:  Travis Topping Meredith Leeds, MD    No chief complaint on file.    History of Present Illness: Travis Huff is a 60 y.o. male who is being seen today for the evaluation of atrial fibrillation at the request of Travis Huff Presenting today for electrophysiology evaluation.    He has a history significant for anxiety, hypertension, obesity, multiple PEs on Eliquis status post IVC filter, non-STEMI due to coronary artery dissection, paroxysmal atrial fibrillation, and chronic combined systolic and diastolic heart failure due to ischemic cardiomyopathy.  Fortunately his ejection fraction has normalized.  He had a hospitalization for palpitations, weakness, and syncope.  He initially presented with his primary physician's office and was found to have elevated troponin on labs.  Left heart catheterization showed an LAD dissection with apical narrowing.  He wore a cardiac monitor that showed rapid atrial fibrillation.  Today, denies symptoms of palpitations, chest pain, shortness of breath, orthopnea, PND, lower extremity edema, claudication, dizziness, presyncope, syncope, bleeding, or neurologic sequela. The patient is tolerating medications without difficulties.  Unfortunately he has continued to have episodes of atrial fibrillation.  This is despite taking the Multaq.  He has no chest pain, but does note fatigue and shortness of breath when he is in atrial fibrillation.  He states that there are many triggers for his atrial fibrillation, including using the restroom, and at times walking long distances.   Past Medical History:  Diagnosis Date  . Arrhythmia   . Arthritis   . Asthma    as child  . Atrial fibrillation ([place])   . Avascular necrosis of bone of left hip ([place])   . Back pain   .  Balance problem   . Blood clot in vein 06/2016   x 2 no bleeding disorders found after back surgery  . Complication of anesthesia    ketamine hallucinations  . Constipation   . Coronary artery disease   . Dislocated hip, left, initial encounter (Travis Huff) 10/03/2018  . Dysrhythmia 2020   a-fib  . Hx of blood clots   . Hypertension   . Joint pain   . Neuromuscular disorder (Travis Huff)    neuropathy bi lat legs knee down  . Neuropathy   . PAF (paroxysmal atrial fibrillation) (Travis Huff)   . Prediabetes   . Shakes   . SOBOE (shortness of breath on exertion)   . Swelling of both lower extremities   . Viral pericarditis 20 yrs ago   Past Surgical History:  Procedure Laterality Date  . ABDOMINAL EXPOSURE N/A 06/23/2016   Procedure: ABDOMINAL EXPOSURE;  Surgeon: Rosetta Posner, MD;  Location: [facility];  Service: Vascular;  Laterality: N/A;  . ANTERIOR LATERAL LUMBAR FUSION 4 LEVELS Right 06/23/2016   Procedure: Right  Lumbar two-three Lumbar three-four Lumbar four-five Anterolateral lumbar interbody fusion;  Surgeon: Erline Levine, MD;  Location: [facility];  Service: Neurosurgery;  Laterality: Right;  . ANTERIOR LUMBAR FUSION N/A 06/23/2016   Procedure: Lumbar five -sacral one  Anterior lumbar interbody fusion with Dr. Sherren Mocha Early for approach;  Surgeon: Erline Levine, MD;  Location: [facility];  Service: Neurosurgery;  Laterality: N/A;  . APPLICATION OF INTRAOPERATIVE CT SCAN N/A 06/26/2016   Procedure: APPLICATION OF INTRAOPERATIVE CT SCAN;  Surgeon: Erline Levine, MD;  Location: [facility];  Service: Neurosurgery;  Laterality: N/A;  .  BACK SURGERY    . CARDIAC CATHETERIZATION    . CARDIOVASCULAR STRESS TEST     Negative in May 2014  . COLONOSCOPY    . HERNIA REPAIR     umbilical  . HIP CLOSED REDUCTION Left 10/03/2018   Procedure: CLOSED REDUCTION HIP;  Surgeon: Rod Can, MD;  Location: [facility];  Service: Orthopedics;  Laterality: Left;  . IR IVC FILTER PLMT / S&I Burke Keels GUID/MOD SED  03/26/2018  . IR RADIOLOGIST  EVAL & MGMT  04/16/2019  . JOINT REPLACEMENT    . LEFT HEART CATH AND CORONARY ANGIOGRAPHY N/A 06/21/2018   Procedure: LEFT HEART CATH AND CORONARY ANGIOGRAPHY;  Surgeon: Jettie Booze, MD;  Location: [facility];  Service: Cardiovascular;  Laterality: N/A;  . POSTERIOR LUMBAR FUSION 4 LEVEL N/A 06/26/2016   Procedure: Thoracic ten to Pelvis fixation with AIRO;  Surgeon: Erline Levine, MD;  Location: [facility];  Service: Neurosurgery;  Laterality: N/A;  . REVERSE SHOULDER ARTHROPLASTY Right 09/18/2019   Procedure: REVERSE SHOULDER ARTHROPLASTY;  Surgeon: Justice Britain, MD;  Location: [facility];  Service: Orthopedics;  Laterality: Right;  129min  . TENDON REPAIR     right hand  . TOTAL HIP ARTHROPLASTY Right 05/25/2014   Procedure: RIGHT TOTAL HIP ARTHROPLASTY ANTERIOR APPROACH;  Surgeon: Rod Can, MD;  Location: [facility];  Service: Orthopedics;  Laterality: Right;  . TOTAL HIP ARTHROPLASTY Left 03/27/2018   Procedure: TOTAL HIP ARTHROPLASTY ANTERIOR APPROACH- DA complex;  Surgeon: Rod Can, MD;  Location: [facility];  Service: Orthopedics;  Laterality: Left;  . TOTAL KNEE ARTHROPLASTY Bilateral      Current Outpatient Medications  Medication Sig Dispense Refill  . acetaminophen (TYLENOL) 325 MG tablet Take 650 mg by mouth every 6 (six) hours as needed for mild pain or headache.    [name] apixaban (ELIQUIS) 5 MG TABS tablet TAKE 1 TABLET BY MOUTH TWICE DAILY 180 tablet 2  . atorvastatin (LIPITOR) 80 MG tablet TAKE 1 TABLET BY MOUTH DAILY AT 6PM 90 tablet 3  . cyclobenzaprine (FLEXERIL) 10 MG tablet TAKE 2 TABLETS (20 MG TOTAL) BY MOUTH 2 (TWO) TIMES DAILY. 360 tablet 3  . diltiazem (CARDIZEM CD) 120 MG 24 hr capsule TAKE 1 CAPSULE (120 MG TOTAL) BY MOUTH DAILY. 90 capsule 3  . docusate sodium (COLACE) 100 MG capsule Take 1 capsule (100 mg total) by mouth 2 (two) times daily. 60 capsule 1  . dronedarone (MULTAQ) 400 MG tablet TAKE 1 TABLET BY MOUTH TWICE DAILY WITH A MEAL 180 tablet 1  .  DULoxetine (CYMBALTA) 60 MG capsule TAKE 1 CAPSULE (60 MG TOTAL) BY MOUTH AT BEDTIME. 90 capsule 3  . lisinopril-hydrochlorothiazide (ZESTORETIC) 20-25 MG tablet TAKE 1 TABLET BY MOUTH DAILY. 90 tablet 3  . metoprolol succinate (TOPROL-XL) 100 MG 24 hr tablet Take 1 tablet (100 mg total) by mouth every morning with or immediately following a meal 90 tablet 3  . nitroGLYCERIN (NITROSTAT) 0.4 MG SL tablet Place 1 tablet (0.4 mg total) under the tongue every 5 (five) minutes x 3 doses as needed for chest pain. If taking third dose, call 911. 25 tablet 3  . traMADol (ULTRAM) 50 MG tablet Take 1 tablet(s) by mouth EVERY 6 HOURS as needed for pain. 30 tablet 0  . Vitamin D, Ergocalciferol, (DRISDOL) 1.25 MG (50000 UNIT) CAPS capsule TAKE 1 CAPSULE (50,000 UNITS TOTAL) BY MOUTH EVERY 7 (SEVEN) DAYS. 4 capsule 0  . zolpidem (AMBIEN) 10 MG tablet TAKE 1 TABLET BY MOUTH  EVERY NIGHT AT BEDTIME AS NEEDED FOR SLEEP 90 tablet 1   No current facility-administered medications for this visit.    Allergies:   Ketamine and Morphine and related   Social History:  The patient  reports that he quit smoking about 22 years ago. His smoking use included cigarettes. He has a 25.00 pack-year smoking history. His smokeless tobacco use includes snuff. He reports current alcohol use of about 14.0 standard drinks of alcohol per week. He reports that he does not use drugs.   Family History:  The patient's family history includes Healthy in his daughter and daughter. He was adopted.   ROS:  Please see the history of present illness.   Otherwise, review of systems is positive for none.   All other systems are reviewed and negative.   PHYSICAL EXAM: VS:  BP (!) 146/98   Pulse 62   Ht 6' (1.829 m)   Wt (!) 301 lb 6.4 oz (136.7 kg)   SpO2 99%   BMI 40.88 kg/m  , BMI Body mass index is 40.88 kg/m. GEN: Well nourished, well developed, in no acute distress  HEENT: normal  Neck: no JVD, carotid bruits, or masses Cardiac: RRR;  no murmurs, rubs, or gallops,no edema  Respiratory:  clear to auscultation bilaterally, normal work of breathing GI: soft, nontender, nondistended, + BS MS: no deformity or atrophy  Skin: warm and dry Neuro:  Strength and sensation are intact Psych: euthymic mood, full affect  EKG:  EKG is not ordered today. Personal review of the ekg ordered 03/25/20 shows sinus rhythm  Recent Labs: 02/03/2020: ALT 34; BUN 17; Creatinine, Ser 0.93; Hemoglobin 15.2; Platelets 229; Potassium 4.4; Sodium 140; TSH 2.110    Lipid Panel     Component Value Date/Time   CHOL 170 02/03/2020 1334   TRIG 83 02/03/2020 1334   HDL 73 02/03/2020 1334   CHOLHDL 2.3 02/03/2020 1334   CHOLHDL 2.8 11/20/2018 1147   VLDL 15 05/23/2012 0610   LDLCALC 82 02/03/2020 1334   LDLCALC 76 11/20/2018 1147     Wt Readings from Last 3 Encounters:  05/17/20 (!) 301 lb 6.4 oz (136.7 kg)  05/13/20 299 lb (135.6 kg)  04/29/20 (!) 303 lb (137.4 kg)      Other studies Reviewed: Additional studies/ records that were reviewed today include: TTE 09/30/2018 Review of the above records today demonstrates:   1. Left ventricular ejection fraction, by visual estimation, is 65 to 70%. The left ventricle has normal function. Normal left ventricular size. There is mildly increased left ventricular hypertrophy.  2. Definity contrast agent was given IV to delineate the left ventricular endocardial borders.  3. Left ventricular diastolic Doppler parameters are indeterminate pattern of LV diastolic filling.  4. Global right ventricle has normal systolic function.The right ventricular size is normal. No increase in right ventricular wall thickness.  5. The left ventricular function has improved from previous echo  [facility] 06/21/18  Dist LAD lesion is 75% stenosed. 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  There is mild left ventricular systolic dysfunction.  The left ventricular ejection fraction is 35-45% by visual  estimate.  There is no aortic valve stenosis.    ASSESSMENT AND PLAN:  1.  Paroxysmal atrial fibrillation: Currently on Eliquis, Cardizem, Multaq.  CHA2DS2-VASc of 2.  High risk medication monitoring.  He is unfortunately had more frequent episodes of atrial fibrillation.  He would like to stay in normal rhythm.  We [place] give him information on both ablation  and Tikosyn.  He Angelin Cutrone let us know which she would prefer.  2.  Coronary artery disease: Distal LAD stenosis.  No current chest pain.  3.  Cardiomyopathy: Repeat echo shows normalization of ejection fraction.  Continue with current management.    Current medicines are reviewed at length with the patient today.   The patient does not have concerns regarding his medicines.  The following changes were made today: None  Labs/ tests ordered today include:  No orders of the defined types were placed in this encounter.   Disposition:   FU with Krystian Younglove 3 months  Signed, Torris House Meredith Leeds, MD  05/17/2020 4:13 PM     [address] [address] [id number] [phone] (office) [phone] (fax)

## 2020-05-17 NOTE — Patient Instructions (Addendum)
Medication Instructions:  Your physician recommends that you continue on your current medications as directed. Please refer to the Current Medication list given to you today.  *If you need a refill on your cardiac medications before your next appointment, please call your pharmacy*   Lab Work: None ordered If you have labs (blood work) drawn today and your tests are completely normal, you will receive your results only by: [name] MyChart Message (if you have MyChart) OR . A paper copy in the mail If you have any lab test that is abnormal or we need to change your treatment, we will call you to review the results.   Testing/Procedures: None ordered   Follow-Up: At [facility], you and your health needs are our priority.  As part of our continuing mission to provide you with exceptional heart care, we have created designated Provider Care Teams.  These Care Teams include your primary Cardiologist (physician) and Advanced Practice Providers (APPs -  Physician Assistants and Nurse Practitioners) who all work together to provide you with the care you need, when you need it.  Your next appointment:   6 month(s)  The format for your next appointment:   In Person  Provider:   Allegra Lai, MD    Thank you for choosing [facility]!!   [name], RN [phone]   Other Instructions   Dofetilide capsules What is this medicine? DOFETILIDE (doe FET il ide) is an antiarrhythmic drug. It helps make your heart beat regularly. This medicine also helps to slow rapid heartbeats. This medicine may be used for other purposes; ask your health care provider or pharmacist if you have questions. COMMON BRAND NAME(S): Tikosyn What should I tell my health care provider before I take this medicine? They need to know if you have any of these conditions:  heart disease  history of irregular heartbeat  history of low levels of potassium or magnesium in the blood  kidney  disease  liver disease  an unusual or allergic reaction to dofetilide, other medicines, foods, dyes, or preservatives  pregnant or trying to get pregnant  breast-feeding How should I use this medicine? Take this medicine by mouth with a glass of water. Follow the directions on the prescription label. Do not take with grapefruit juice. You can take it with or without food. If it upsets your stomach, take it with food. Take your medicine at regular intervals. Do not take it more often than directed. Do not stop taking except on your doctor's advice. A special MedGuide will be given to you by the pharmacist with each prescription and refill. Be sure to read this information carefully each time. Talk to your pediatrician regarding the use of this medicine in children. Special care may be needed. Overdosage: If you think you have taken too much of this medicine contact a poison control center or emergency room at once. NOTE: This medicine is only for you. Do not share this medicine with others. What if I miss a dose? If you miss a dose, skip it. Take your next dose at the normal time. Do not take extra or 2 doses at the same time to make up for the missed dose. What may interact with this medicine? Do not take this medicine with any of the following medications:  cimetidine  cisapride  dolutegravir  dronedarone  erdafitinib  hydrochlorothiazide  ketoconazole  megestrol  pimozide  prochlorperazine  thioridazine  trimethoprim  verapamil This medicine may also interact with the following medications:  amiloride  cannabinoids  certain antibiotics like erythromycin or clarithromycin  certain antiviral medicines for HIV or hepatitis  certain medicines for depression, anxiety, or psychotic disorders  digoxin  diltiazem  grapefruit juice  metformin  nefazodone  other medicines that prolong the QT interval (an abnormal heart  rhythm)  quinine  triamterene  zafirlukast  ziprasidone This list may not describe all possible interactions. Give your health care provider a list of all the medicines, herbs, non-prescription drugs, or dietary supplements you use. Also tell them if you smoke, drink alcohol, or use illegal drugs. Some items may interact with your medicine. What should I watch for while using this medicine? Your condition will be monitored carefully while you are receiving this medicine. What side effects may I notice from receiving this medicine? Side effects that you should report to your doctor or health care professional as soon as possible:  allergic reactions like skin rash, itching or hives, swelling of the face, lips, or tongue  breathing problems  chest pain or chest tightness  dizziness  signs and symptoms of a dangerous change in heartbeat or heart rhythm like chest pain; dizziness; fast or irregular heartbeat; palpitations; feeling faint or lightheaded, falls; breathing problems  signs and symptoms of electrolyte imbalance like severe diarrhea, unusual sweating, vomiting, loss of appetite, increased thirst  swelling of the ankles, legs, or feet  tingling, numbness in the hands or feet Side effects that usually do not require medical attention (report to your doctor or health care professional if they continue or are bothersome):  diarrhea  general ill feeling or flu-like symptoms  headache  nausea  trouble sleeping  stomach pain This list may not describe all possible side effects. Call your doctor for medical advice about side effects. You may report side effects to FDA at 1-800-FDA-1088. Where should I keep my medicine? Keep out of the reach of children. Store at room temperature between 15 and 30 degrees C (59 and 86 degrees F). Throw away any unused medicine after the expiration date. NOTE: This sheet is a summary. It may not cover all possible information. If you have  questions about this medicine, talk to your doctor, pharmacist, or health care provider.  2021 Elsevier/Gold Standard (2018-11-19 12:14:05)     Cardiac Ablation Cardiac ablation is a procedure to destroy (ablate) some heart tissue that is sending bad signals. These bad signals cause problems in heart rhythm. The heart has many areas that make these signals. If there are problems in these areas, they can make the heart beat in a way that is not normal. Destroying some tissues can help make the heart rhythm normal. Tell your doctor about:  Any allergies you have.  All medicines you are taking. These include vitamins, herbs, eye drops, creams, and over-the-counter medicines.  Any problems you or family members have had with medicines that make you fall asleep (anesthetics).  Any blood disorders you have.  Any surgeries you have had.  Any medical conditions you have, such as kidney failure.  Whether you are pregnant or may be pregnant. What are the risks? This is a safe procedure. But problems may occur, including:  Infection.  Bruising and bleeding.  Bleeding into the chest.  Stroke or blood clots.  Damage to nearby areas of your body.  Allergies to medicines or dyes.  The need for a pacemaker if the normal system is damaged.  Failure of the procedure to treat the problem. What happens before the procedure? Medicines Ask your  doctor about:  Changing or stopping your normal medicines. This is important.  Taking aspirin and ibuprofen. Do not take these medicines unless your doctor tells you to take them.  Taking other medicines, vitamins, herbs, and supplements. General instructions  Follow instructions from your doctor about what you cannot eat or drink.  Plan to have someone take you home from the hospital or clinic.  If you will be going home right after the procedure, plan to have someone with you for 24 hours.  Ask your doctor what steps will be taken to  prevent infection. What happens during the procedure?  An IV tube will be put into one of your veins.  You will be given a medicine to help you relax.  The skin on your neck or groin will be numbed.  A cut (incision) will be made in your neck or groin. A needle will be put through your cut and into a large vein.  A tube (catheter) will be put into the needle. The tube will be moved to your heart.  Dye may be put through the tube. This helps your doctor see your heart.  Small devices (electrodes) on the tube will send out signals.  A type of energy will be used to destroy some heart tissue.  The tube will be taken out.  Pressure will be held on your cut. This helps stop bleeding.  A bandage will be put over your cut. The exact procedure may vary among doctors and hospitals.   What happens after the procedure?  You will be watched until you leave the hospital or clinic. This includes checking your heart rate, breathing rate, oxygen, and blood pressure.  Your cut will be watched for bleeding. You will need to lie still for a few hours.  Do not drive for 24 hours or as long as your doctor tells you. Summary  Cardiac ablation is a procedure to destroy some heart tissue. This is done to treat heart rhythm problems.  Tell your doctor about any medical conditions you may have. Tell him or her about all medicines you are taking to treat them.  This is a safe procedure. But problems may occur. These include infection, bruising, bleeding, and damage to nearby areas of your body.  Follow what your doctor tells you about food and drink. You may also be told to change or stop some of your medicines.  After the procedure, do not drive for 24 hours or as long as your doctor tells you. This information is not intended to replace advice given to you by your health care provider. Make sure you discuss any questions you have with your health care provider. Document Revised: 11/21/2018  Document Reviewed: 11/21/2018 Elsevier Patient Education  2021 [employer].

## 2020-05-18 ENCOUNTER — Other Ambulatory Visit (HOSPITAL_BASED_OUTPATIENT_CLINIC_OR_DEPARTMENT_OTHER): Payer: Self-pay

## 2020-05-19 ENCOUNTER — Other Ambulatory Visit (HOSPITAL_BASED_OUTPATIENT_CLINIC_OR_DEPARTMENT_OTHER): Payer: Self-pay

## 2020-05-19 ENCOUNTER — Other Ambulatory Visit: Payer: Self-pay | Admitting: Sports Medicine

## 2020-05-19 MED ORDER — SILDENAFIL CITRATE 100 MG PO TABS
100.0000 mg | ORAL_TABLET | Freq: Every day | ORAL | 11 refills | Status: DC | PRN
  Filled 2020-05-19: qty 18, 90d supply, fill #0
  Filled 2020-05-24: qty 6, 30d supply, fill #0
  Filled 2020-08-29: qty 6, 30d supply, fill #1
  Filled 2020-12-31: qty 6, 30d supply, fill #2
  Filled 2021-01-30: qty 6, 30d supply, fill #3
  Filled 2021-03-02: qty 6, 30d supply, fill #4
  Filled 2021-04-13: qty 6, 30d supply, fill #5
  Filled 2021-05-15: qty 6, 30d supply, fill #6

## 2020-05-20 NOTE — Progress Notes (Signed)
Chief Complaint:   OBESITY Travis Huff is here to discuss his progress with his obesity treatment plan along with follow-up of his obesity related diagnoses.   Today's visit was #: 7 Starting weight: 350 lbs Starting date: 02/03/2020 Today's weight: 299 lbs Today's date: 05/13/2020 Weight change since last visit: 4 lbs Total lbs lost to date: 51 lbs Body mass index is 41.7 kg/m.  Total weight loss percentage to date: -14.57%  Interim History:  Travis Huff has been doing great, but over the weekend he drank 4-8 drinks (pints of beer) for 2 days.  He is disappointed in his weight loss today.  Current Meal Plan: the Category 4 Plan for 90% of the time.  Current Exercise Plan: Walking for 10-30 for 3 times per week.  Assessment/Plan:   Medications Discontinued During This Encounter  Medication Reason  . Vitamin D, Ergocalciferol, (DRISDOL) 1.25 MG (50000 UNIT) CAPS capsule Reorder    Meds ordered this encounter  Medications  . Vitamin D, Ergocalciferol, (DRISDOL) 1.25 MG (50000 UNIT) CAPS capsule    Sig: TAKE 1 CAPSULE (50,000 UNITS TOTAL) BY MOUTH EVERY 7 (SEVEN) DAYS.    Dispense:  4 capsule    Refill:  0    1. Vitamin D deficiency Not at goal. Current vitamin D is 26.1, tested on 02/03/2020. Optimal goal > 50 ng/dL.  She is taking vitamin D 50,000 IU weekly.  Plan: Continue to take prescription Vitamin D @50 ,000 IU every week as prescribed.  Follow-up for routine testing of Vitamin D, at least 2-3 times per year to avoid over-replacement.  - Refill Vitamin D, Ergocalciferol, (DRISDOL) 1.25 MG (50000 UNIT) CAPS capsule; TAKE 1 CAPSULE (50,000 UNITS TOTAL) BY MOUTH EVERY 7 (SEVEN) DAYS.  Dispense: 4 capsule; Refill: 0  2. At risk for impaired metabolic function Due to Travis Huff's current state of health and medical condition(s), he is at a significantly higher risk for impaired metabolic function.   At least 9 minutes was spent on counseling Travis Huff about these concerns today.  This places  the patient at a much greater risk to subsequently develop cardio-pulmonary conditions that can negatively affect the patient's quality of life.  I stressed the importance of reversing these risks factors.  The initial goal is to lose at least 5-10% of starting weight to help reduce risk factors.  Counseling:  Intensive lifestyle modifications discussed with Travis Huff as the most appropriate first line treatment.  he will continue to work on diet, exercise, and weight loss efforts.  We will continue to reassess these conditions on a fairly regular basis in an attempt to decrease the patient's overall morbidity and mortality.  3. Obesity, Current BMI 41.70  Course: Travis Huff is currently in the action stage of change. As such, his goal is to continue with weight loss efforts.   Nutrition goals: He has agreed to the Category 4 Plan with breakfast options.   Exercise goals: As is.  Behavioral modification strategies: increasing lean protein intake, decreasing simple carbohydrates, meal planning and cooking strategies and planning for success.  Travis Huff has agreed to follow-up with our clinic in 2-3 weeks. He was informed of the importance of frequent follow-up visits to maximize his success with intensive lifestyle modifications for his multiple health conditions.   Objective:   Blood pressure 116/69, pulse 68, temperature 98 F (36.7 C), height 5\' 11"  (1.803 m), weight 299 lb (135.6 kg), SpO2 98 %. Body mass index is 41.7 kg/m.  General: Cooperative, alert, well developed, in no acute  distress. HEENT: Conjunctivae and lids unremarkable. Cardiovascular: Regular rhythm.  Lungs: Normal work of breathing. Neurologic: No focal deficits.   Lab Results  Component Value Date   CREATININE 0.93 02/03/2020   BUN 17 02/03/2020   NA 140 02/03/2020   K 4.4 02/03/2020   CL 98 02/03/2020   CO2 25 02/03/2020   Lab Results  Component Value Date   ALT 34 02/03/2020   AST 39 02/03/2020   ALKPHOS 149 (H)  02/03/2020   BILITOT 0.3 02/03/2020   Lab Results  Component Value Date   HGBA1C 6.0 (H) 02/03/2020   HGBA1C 5.6 06/19/2018   HGBA1C 5.9 (H) 04/17/2017   HGBA1C  12/14/2009    5.5 (NOTE)                                                                       According to the ADA Clinical Practice Recommendations for 2011, when HbA1c is used as a screening test:   >=6.5%   Diagnostic of Diabetes Mellitus           (if abnormal result  is confirmed)  5.7-6.4%   Increased risk of developing Diabetes Mellitus  References:Diagnosis and Classification of Diabetes Mellitus,Diabetes CXKG,8185,63(JSHFW 1):S62-S69 and Standards of Medical Care in         Diabetes - 2011,Diabetes Care,2011,34  (Suppl 1):S11-S61.   Lab Results  Component Value Date   INSULIN 17.2 02/03/2020   Lab Results  Component Value Date   TSH 2.110 02/03/2020   Lab Results  Component Value Date   CHOL 170 02/03/2020   HDL 73 02/03/2020   LDLCALC 82 02/03/2020   TRIG 83 02/03/2020   CHOLHDL 2.3 02/03/2020   Lab Results  Component Value Date   WBC 8.4 02/03/2020   HGB 15.2 02/03/2020   HCT 44.6 02/03/2020   MCV 92 02/03/2020   PLT 229 02/03/2020   Attestation Statements:   Reviewed by clinician on day of visit: allergies, medications, problem list, medical history, surgical history, family history, social history, and previous encounter notes.  I, Water quality scientist, CMA, am acting as Location manager for [employer], DO.  I have reviewed the above documentation for accuracy and completeness, and I agree with the above. Marjory Sneddon, D.O.  The [place] was signed into law in 2016 which includes the topic of electronic health records.  This provides immediate access to information in MyChart.  This includes consultation notes, operative notes, office notes, lab results and pathology reports.  If you have any questions about what you read please let us know at your next visit so we can discuss  your concerns and take corrective action if need be.  We are right here with you.

## 2020-05-21 ENCOUNTER — Other Ambulatory Visit (HOSPITAL_BASED_OUTPATIENT_CLINIC_OR_DEPARTMENT_OTHER): Payer: Self-pay

## 2020-05-22 MED FILL — Cyclobenzaprine HCl Tab 10 MG: ORAL | 90 days supply | Qty: 270 | Fill #1 | Status: CN

## 2020-05-22 MED FILL — Apixaban Tab 5 MG: ORAL | 90 days supply | Qty: 180 | Fill #0 | Status: AC

## 2020-05-24 ENCOUNTER — Other Ambulatory Visit (HOSPITAL_BASED_OUTPATIENT_CLINIC_OR_DEPARTMENT_OTHER): Payer: Self-pay

## 2020-05-24 MED FILL — Cyclobenzaprine HCl Tab 10 MG: ORAL | 90 days supply | Qty: 270 | Fill #1 | Status: AC

## 2020-05-25 ENCOUNTER — Telehealth: Payer: Self-pay | Admitting: *Deleted

## 2020-05-25 NOTE — Telephone Encounter (Signed)
Will route to PharmD for rec's re: holding anticoagulation. Richardson Dopp, PA-C    05/25/2020 9:34 AM

## 2020-05-25 NOTE — Telephone Encounter (Signed)
   [facility] Pre-operative Risk Assessment    Patient Name: KAZUTO SEVEY  DOB: 1960/02/15  MRN: [id number]   HEARTCARE STAFF: - Please ensure there is not already an duplicate clearance open for this procedure. - Under Visit Info/Reason for Call, type in Other and utilize the format Clearance MM/DD/YY or Clearance TBD. Do not use dashes or single digits. - If request is for dental extraction, please clarify the # of teeth to be extracted.  Request for surgical clearance:  1. What type of surgery is being performed? LT REVERSE SHOULDER ARTHROPLASTY   2. When is this surgery scheduled?  06/10/20   3. What type of clearance is required (medical clearance vs. Pharmacy clearance to hold med vs. Both)? BOTH  4. Are there any medications that need to be held prior to surgery and how long? ELIQUIS   5. Practice name and name of physician performing surgery?  [facility] / DR. SUPPLE    6. What is the office phone number?  [phone]   7.   What is the office fax number?  [phone] ATTN:  KELLY  8.   Anesthesia type (None, local, MAC, general) ?  GENERAL   Jeanann Lewandowsky 05/25/2020, 9:22 AM  _________________________________________________________________   (provider comments below)

## 2020-05-25 NOTE — Telephone Encounter (Signed)
Patient with diagnosis of afib and recurrent VTEs on Eliquis for anticoagulation.    Procedure: LT REVERSE SHOULDER ARTHROPLASTY  Date of procedure: 06/10/20  CHA2DS2-VASc Score = 5  This indicates a 7.2% annual risk of stroke. The patient's score is based upon: CHF History: Yes HTN History: Yes Diabetes History: No Stroke History: Yes (recurrent VTEs) Vascular Disease History: Yes Age Score: 0 Gender Score: 0  CrCl >141mL/min Platelet count 229K  Typically hold 2-3 days prior to shoulder arthroplasty, however pt is at elevated risk given history of multiple PE/DVTs. He was cleared to hold Eliquis for 2 days prior to same procedure on his right shoulder last year (05/08/19 phone note). Since it's been a year, will forward to MD for input again.

## 2020-05-27 NOTE — Patient Instructions (Signed)
DUE TO COVID-19 ONLY ONE VISITOR IS ALLOWED TO COME WITH YOU AND STAY IN THE WAITING ROOM ONLY DURING PRE OP AND PROCEDURE DAY OF SURGERY. THE 1 VISITOR  MAY VISIT WITH YOU AFTER SURGERY IN YOUR PRIVATE ROOM DURING VISITING HOURS ONLY!                Travis Huff   Your procedure is scheduled on: 06/10/20   Report to [facility] Main  Entrance   Report to admitting at  10:00 AM     Call this number if you have problems the morning of surgery [place], NO CHEWING GUM [place].    No food after midnight.    You may have clear liquid until 9:30 AM  .  At 9:00 AM drink pre surgery drink  . Nothing by mouth after 9:30 AM.   Take these medicines the morning of surgery with A SIP OF WATER: Dronedarone, Metoprolol                                 You may not have any metal on your body including              piercings  Do not wear jewelry,  lotions, powders or deodorant              Men may shave face and neck.   Do not bring valuables to the hospital. [place].  Contacts, dentures or bridgework may not be worn into surgery.       Patients discharged the day of surgery will not be allowed to drive home. IF YOU ARE HAVING SURGERY AND GOING HOME THE SAME DAY, YOU MUST HAVE AN ADULT TO DRIVE YOU HOME AND BE WITH YOU FOR 24 HOURS. YOU MAY GO HOME BY TAXI OR UBER OR ORTHERWISE, BUT AN ADULT MUST ACCOMPANY YOU HOME AND STAY WITH YOU FOR 24 HOURS.  Name and phone number of your driver:  Special Instructions: N/A              Please read over the following fact sheets you were given: _____________________________________________________________________             [facility]- Preparing for Total Shoulder Arthroplasty    Before surgery, you can play an important role. Because skin is not sterile, your skin needs to be as free of germs as possible.  You can reduce the number of germs on your skin by using the following products. . Benzoyl Peroxide Gel o Reduces the number of germs present on the skin o Applied twice a day to shoulder area starting two days before surgery    ==================================================================  Please follow these instructions carefully:  BENZOYL PEROXIDE 5% GEL  Please do not use if you have an allergy to benzoyl peroxide.   If your skin becomes reddened/irritated stop using the benzoyl peroxide.  Starting two days before surgery, apply as follows: 1. Apply benzoyl peroxide in the morning and at night. Apply after taking a shower. If you are not taking a shower clean entire shoulder front, back, and side along with the armpit with a clean wet washcloth.  2. Place a quarter-sized dollop on your shoulder and rub in thoroughly, making  sure to cover the front, back, and side of your shoulder, along with the armpit.   2 days before ____ AM   ____ PM              1 day before ____ AM   ____ PM                         3. Do this twice a day for two days.  (Last application is the night before surgery, AFTER using the CHG soap as described below).  4. Do NOT apply benzoyl peroxide gel on the day of surgery. 5.   [facility] - Preparing for Surgery Before surgery, you can play an important role.  Because skin is not sterile, your skin needs to be as free of germs as possible.  You can reduce the number of germs on your skin by washing with CHG (chlorahexidine gluconate) soap before surgery.  CHG is an antiseptic cleaner which kills germs and bonds with the skin to continue killing germs even after washing. Please DO NOT use if you have an allergy to CHG or antibacterial soaps.  If your skin becomes reddened/irritated stop using the CHG and inform your nurse when you arrive at Short Stay. .  You may shave your face/neck.  Please follow these instructions carefully:  1.  Shower with CHG Soap  the night before surgery and the  morning of Surgery.  2.  If you choose to wash your hair, wash your hair first as usual with your  normal  shampoo.  3.  After you shampoo, rinse your hair and body thoroughly to remove the  shampoo.                                  4.  Use CHG as you would any other liquid soap.  You can apply chg directly  to the skin and wash                       Gently with a scrungie or clean washcloth.  5.  Apply the CHG Soap to your body ONLY FROM THE NECK DOWN.   Do not use on face/ open                           Wound or open sores. Avoid contact with eyes, ears mouth and genitals (private parts).                       Wash face,  Genitals (private parts) with your normal soap.       6.  Wash thoroughly, paying special attention to the area where your surgery  will be performed.  7.  Thoroughly rinse your body with warm water from the neck down.  8.  DO NOT shower/wash with your normal soap after using and rinsing off  the CHG Soap.       9.  Pat yourself dry with a clean towel.       10.  Wear clean pajamas.       11.  Place clean sheets on your bed the night of your first shower and do not  sleep with pets.  Day of Surgery : Do not apply any lotions/deodorants the morning of surgery.  Please wear clean clothes to the hospital/surgery  center.  FAILURE TO FOLLOW THESE INSTRUCTIONS MAY RESULT IN THE CANCELLATION OF YOUR SURGERY PATIENT SIGNATURE_________________________________  NURSE SIGNATURE__________________________________  ________________________________________________________________________              Travis Huff  An incentive spirometer is a tool that can help keep your lungs clear and active. This tool measures how well you are filling your lungs with each breath. Taking long deep breaths may help reverse or decrease the chance of developing breathing (pulmonary) problems (especially infection) following:  A long period of time when  you are unable to move or be active. BEFORE THE PROCEDURE   If the spirometer includes an indicator to show your best effort, your nurse or respiratory therapist will set it to a desired goal.  If possible, sit up straight or lean slightly forward. Try not to slouch.  Hold the incentive spirometer in an upright position. INSTRUCTIONS FOR USE  1. Sit on the edge of your bed if possible, or sit up as far as you can in bed or on a chair. 2. Hold the incentive spirometer in an upright position. 3. Breathe out normally. 4. Place the mouthpiece in your mouth and seal your lips tightly around it. 5. Breathe in slowly and as deeply as possible, raising the piston or the ball toward the top of the column. 6. Hold your breath for 3-5 seconds or for as long as possible. Allow the piston or ball to fall to the bottom of the column. 7. Remove the mouthpiece from your mouth and breathe out normally. 8. Rest for a few seconds and repeat Steps 1 through 7 at least 10 times every 1-2 hours when you are awake. Take your time and take a few normal breaths between deep breaths. 9. The spirometer may include an indicator to show your best effort. Use the indicator as a goal to work toward during each repetition. 10. After each set of 10 deep breaths, practice coughing to be sure your lungs are clear. If you have an incision (the cut made at the time of surgery), support your incision when coughing by placing a pillow or rolled up towels firmly against it. Once you are able to get out of bed, walk around indoors and cough well. You may stop using the incentive spirometer when instructed by your caregiver.  RISKS AND COMPLICATIONS  Take your time so you do not get dizzy or light-headed.  If you are in pain, you may need to take or ask for pain medication before doing incentive spirometry. It is harder to take a deep breath if you are having pain. AFTER USE  Rest and breathe slowly and easily.  It can be  helpful to keep track of a log of your progress. Your caregiver can provide you with a simple table to help with this. If you are using the spirometer at home, follow these instructions: [place] IF:   You are having difficultly using the spirometer.  You have trouble using the spirometer as often as instructed.  Your pain medication is not giving enough relief while using the spirometer.  You develop fever of 100.5 F (38.1 C) or higher. SEEK IMMEDIATE MEDICAL CARE IF:   You cough up bloody sputum that had not been present before.  You develop fever of 102 F (38.9 C) or greater.  You develop worsening pain at or near the incision site. MAKE SURE YOU:   Understand these instructions.  Will watch your condition.  Will get help right  away if you are not doing well or get worse. Document Released: 05/01/2006 Document Revised: 03/13/2011 Document Reviewed: 07/02/2006 [facility] Patient Information 2014 [place], [place].   ________________________________________________________________________

## 2020-05-27 NOTE — Telephone Encounter (Signed)
I would use same approach as last year assuming no new cardiac symptoms or interval CV events.

## 2020-05-28 ENCOUNTER — Encounter (HOSPITAL_COMMUNITY)
Admission: RE | Admit: 2020-05-28 | Discharge: 2020-05-28 | Disposition: A | Discharge status: Home / Self Care | Payer: 59 | Source: Ambulatory Visit | Attending: Orthopedic Surgery | Admitting: Orthopedic Surgery

## 2020-05-28 ENCOUNTER — Encounter (HOSPITAL_COMMUNITY): Payer: Self-pay

## 2020-05-28 ENCOUNTER — Other Ambulatory Visit: Payer: Self-pay

## 2020-05-28 DIAGNOSIS — Z01812 Encounter for preprocedural laboratory examination: Secondary | ICD-10-CM | POA: Insufficient documentation

## 2020-05-28 HISTORY — DX: Prediabetes: R73.03

## 2020-05-28 LAB — BASIC METABOLIC PANEL
Anion gap: 10 (ref 5–15)
BUN: 20 mg/dL (ref 6–20)
CO2: 25 mmol/L (ref 22–32)
Calcium: 9.3 mg/dL (ref 8.9–10.3)
Chloride: 102 mmol/L (ref 98–111)
Creatinine, Ser: 0.94 mg/dL (ref 0.61–1.24)
GFR, Estimated: >60 mL/min (ref >60)
Glucose, Bld: 106 mg/dL — ABNORMAL HIGH (ref 70–99)
Potassium: 4.4 mmol/L (ref 3.5–5.1)
Sodium: 137 mmol/L (ref 135–145)

## 2020-05-28 LAB — CBC
HCT: 48.3 % (ref 39.0–52.0)
Hemoglobin: 16.1 g/dL (ref 13.0–17.0)
MCH: 31.3 pg (ref 26.0–34.0)
MCHC: 33.3 g/dL (ref 30.0–36.0)
MCV: 94 fL (ref 80.0–100.0)
Platelets: 184 10*3/uL (ref 150–400)
RBC: 5.14 MIL/uL (ref 4.22–5.81)
RDW: 13.8 % (ref 11.5–15.5)
WBC: 7.7 10*3/uL (ref 4.0–10.5)
nRBC: 0 % (ref 0.0–0.2)

## 2020-05-28 LAB — SURGICAL PCR SCREEN
MRSA, PCR: NEGATIVE
Staphylococcus aureus: NEGATIVE

## 2020-05-28 LAB — HEMOGLOBIN A1C
Hgb A1c MFr Bld: 5.8 % — ABNORMAL HIGH (ref 4.8–5.6)
Mean Plasma Glucose: 120 mg/dL

## 2020-05-28 NOTE — Progress Notes (Signed)
COVID Vaccine Completed:Yes Date COVID Vaccine completed:09/10/19 COVID vaccine manufacturer:    Moderna    PCP - Dr. Dwyane Dee Cardiologist - Dr. Linard Millers  Chest x-ray - no EKG - 04/14/20-epic Stress Test - 2014-epic ECHO -09/30/18-epic  Cardiac Cath - 06/21/18-epic Pacemaker/ICD device last checked:NA  Sleep Study - Stop Band score of 5. Pt and his PCP are aware and Pt refuses a sleep study CPAP - no  Fasting Blood Sugar - NA Checks Blood Sugar _____ times a day  Blood Thinner Instructions:Eliquis/ Dr. Linard Millers Aspirin Instructions:Pt has an appointment on 5/31 with Adline Peals PA who will tell him what date to stop his Eliquis. Last Dose:  Anesthesia review:   Patient denies shortness of breath, fever, cough and chest pain at PAT appointment Yes. Pt's BMI is 40.1. He does get SOB with exertion. He climbs stairs slowly because his balance is off from severe neuropathy in his legs from the knees down. He sometimes gets winded with work around the house but not ADLs. He has a IVC filter in place.  Patient verbalized understanding of instructions that were given to them at the PAT appointment. Patient was also instructed that they will need to review over the PAT instructions again at home before surgery.Yes

## 2020-05-28 NOTE — Telephone Encounter (Signed)
Ok to hold Eliquis for 2 days prior to procedure per Dr Tamala Julian.

## 2020-05-28 NOTE — Telephone Encounter (Signed)
Pharmacy recommendation on Eliquis noted. Will await appointment with the Afib clinic on 5/31 as preoperative status will need to be addressed at that time given patient symptoms.

## 2020-05-28 NOTE — Telephone Encounter (Signed)
   Patient Name: Travis Huff  DOB: [birth year]  MRN: [id number]   Primary Cardiologist: Sinclair Grooms, MD  Chart reviewed as part of pre-operative protocol coverage. Spoke with patient's wife this morning as the patient was still asleep. She indicates the patient has not been doing well from a cardiac perspective and has been having issues with his Afib. He has an appointment with the Afib clinic on 06/01/2020. In this setting, final preprocedure cardiac risk stratification is deferred until patient is evaluated by the Afib clinic. I will route this note to the Afib clinic provider he is scheduled to see as well as to the orthopedic office as an update.    Christell Faith, PA-C 05/28/2020, 8:23 AM

## 2020-06-01 ENCOUNTER — Encounter (HOSPITAL_COMMUNITY): Payer: Self-pay | Admitting: Physician Assistant

## 2020-06-01 ENCOUNTER — Ambulatory Visit (HOSPITAL_COMMUNITY)
Admission: RE | Admit: 2020-06-01 | Discharge: 2020-06-01 | Disposition: A | Discharge status: Home / Self Care | Payer: 59 | Source: Ambulatory Visit | Attending: Physician Assistant | Admitting: Physician Assistant

## 2020-06-01 ENCOUNTER — Other Ambulatory Visit: Payer: Self-pay

## 2020-06-01 VITALS — BP 116/88 | HR 63 | Ht 72.0 in | Wt 296.4 lb

## 2020-06-01 DIAGNOSIS — Z79899 Other long term (current) drug therapy: Secondary | ICD-10-CM | POA: Diagnosis not present

## 2020-06-01 DIAGNOSIS — I429 Cardiomyopathy, unspecified: Secondary | ICD-10-CM | POA: Diagnosis not present

## 2020-06-01 DIAGNOSIS — Z6841 Body Mass Index (BMI) 40.0 and over, adult: Secondary | ICD-10-CM | POA: Diagnosis not present

## 2020-06-01 DIAGNOSIS — I2510 Atherosclerotic heart disease of native coronary artery without angina pectoris: Secondary | ICD-10-CM | POA: Insufficient documentation

## 2020-06-01 DIAGNOSIS — E669 Obesity, unspecified: Secondary | ICD-10-CM | POA: Diagnosis not present

## 2020-06-01 DIAGNOSIS — Z7901 Long term (current) use of anticoagulants: Secondary | ICD-10-CM | POA: Insufficient documentation

## 2020-06-01 DIAGNOSIS — I10 Essential (primary) hypertension: Secondary | ICD-10-CM | POA: Insufficient documentation

## 2020-06-01 DIAGNOSIS — I255 Ischemic cardiomyopathy: Secondary | ICD-10-CM | POA: Insufficient documentation

## 2020-06-01 DIAGNOSIS — D6869 Other thrombophilia: Secondary | ICD-10-CM | POA: Insufficient documentation

## 2020-06-01 DIAGNOSIS — I517 Cardiomegaly: Secondary | ICD-10-CM | POA: Diagnosis not present

## 2020-06-01 DIAGNOSIS — Z87891 Personal history of nicotine dependence: Secondary | ICD-10-CM | POA: Insufficient documentation

## 2020-06-01 DIAGNOSIS — I480 Paroxysmal atrial fibrillation: Secondary | ICD-10-CM | POA: Diagnosis present

## 2020-06-01 DIAGNOSIS — I4510 Unspecified right bundle-branch block: Secondary | ICD-10-CM | POA: Diagnosis not present

## 2020-06-01 NOTE — Progress Notes (Signed)
Primary Care Physician: Silverio Decamp, MD Primary Cardiologist: Dr Tamala Julian Primary Electrophysiologist: Dr Curt Bears Referring Physician: Dr Charm Barges is a 60 y.o. male with a history of HTN, h/o multiple Pes, NSTEMI, probable CA dissection, ischemic CM, paroxysmal atrial fibrillation who presents for follow up in the [facility]. Patient is on Eliquis for a CHADS2VASC score of 3. He was seen by Dr Curt Bears on 12/2018 and Dr Tamala Julian on 02/2019 and at that point he was having minimal afib symptoms. Patient wore a Zio patch 06/2018 which showed no atrial fibrillation. Patient triggered events were only associated with PVCs (1.7% PVC burden). He has been maintained on Multaq.   On follow up today, patient reports that a couple weeks ago he had more frequent episodes of dizziness and heart racing. The episodes do not last more than 10 minutes. Over the last several days he had not had any episodes and feels well.   Today, he denies symptoms of chest pain, shortness of breath, orthopnea, PND, lower extremity edema, presyncope, syncope, snoring, daytime somnolence, bleeding, or neurologic sequela. The patient is tolerating medications without difficulties and is otherwise without complaint today.    Atrial Fibrillation Risk Factors:  he does not have symptoms or diagnosis of sleep apnea. he does not have a history of rheumatic fever. he does have a history of alcohol use.  he has a BMI of Body mass index is 40.2 kg/m.[name] Filed Weights   06/01/20 1438  Weight: 134.4 kg    Family History  Adopted: Yes  Problem Relation Age of Onset  . Healthy Daughter   . Healthy Daughter      Atrial Fibrillation Management history:  Previous antiarrhythmic drugs: Multaq Previous cardioversions: none Previous ablations: none CHADS2VASC score: 3 Anticoagulation history: Eliquis   Past Medical History:  Diagnosis Date  . Arrhythmia   . Arthritis     shoulders, knees,  back,hips  . Atrial fibrillation ([place])   . Avascular necrosis of bone of left hip ([place])   . Back pain   . Balance problem   . Blood clot in vein 06/2016   x 2 no bleeding disorders found after back surgery  . Complication of anesthesia    ketamine hallucinations  . Constipation   . Coronary artery disease   . Dislocated hip, left, initial encounter ([place]) 10/03/2018  . Dysrhythmia 2020   a-fib  . Hx of blood clots   . Hypertension   . Joint pain   . Neuromuscular disorder ([place])    neuropathy bi lat legs knee down  . Neuropathy   . PAF (paroxysmal atrial fibrillation) ([place])   . Pre-diabetes   . Prediabetes   . Shakes   . SOBOE (shortness of breath on exertion)   . Swelling of both lower extremities   . Viral pericarditis 20 yrs ago   Past Surgical History:  Procedure Laterality Date  . ABDOMINAL EXPOSURE N/A 06/23/2016   Procedure: ABDOMINAL EXPOSURE;  Surgeon: Rosetta Posner, MD;  Location: [facility];  Service: Vascular;  Laterality: N/A;  . ANTERIOR LATERAL LUMBAR FUSION 4 LEVELS Right 06/23/2016   Procedure: Right  Lumbar two-three Lumbar three-four Lumbar four-five Anterolateral lumbar interbody fusion;  Surgeon: Erline Levine, MD;  Location: [facility];  Service: Neurosurgery;  Laterality: Right;  . ANTERIOR LUMBAR FUSION N/A 06/23/2016   Procedure: Lumbar five -sacral one  Anterior lumbar interbody fusion with Dr. Sherren Mocha Early for approach;  Surgeon: Erline Levine, MD;  Location: [facility];  Service: Neurosurgery;  Laterality: N/A;  . APPLICATION OF INTRAOPERATIVE CT SCAN N/A 06/26/2016   Procedure: APPLICATION OF INTRAOPERATIVE CT SCAN;  Surgeon: Erline Levine, MD;  Location: [facility];  Service: Neurosurgery;  Laterality: N/A;  . BACK SURGERY  2018  . CARDIAC CATHETERIZATION    . CARDIOVASCULAR STRESS TEST     Negative in May 2014  . COLONOSCOPY    . HERNIA REPAIR     umbilical  . HIP CLOSED REDUCTION Left 10/03/2018   Procedure: CLOSED REDUCTION HIP;  Surgeon: Rod Can, MD;  Location: [facility];  Service: Orthopedics;  Laterality: Left;  . IR IVC FILTER PLMT / S&I Burke Keels GUID/MOD SED  03/26/2018  . IR RADIOLOGIST EVAL & MGMT  04/16/2019  . JOINT REPLACEMENT    . LEFT HEART CATH AND CORONARY ANGIOGRAPHY N/A 06/21/2018   Procedure: LEFT HEART CATH AND CORONARY ANGIOGRAPHY;  Surgeon: Jettie Booze, MD;  Location: [facility];  Service: Cardiovascular;  Laterality: N/A;  . POSTERIOR LUMBAR FUSION 4 LEVEL N/A 06/26/2016   Procedure: Thoracic ten to Pelvis fixation with AIRO;  Surgeon: Erline Levine, MD;  Location: [facility];  Service: Neurosurgery;  Laterality: N/A;  . REVERSE SHOULDER ARTHROPLASTY Right 09/18/2019   Procedure: REVERSE SHOULDER ARTHROPLASTY;  Surgeon: Justice Britain, MD;  Location: [facility];  Service: Orthopedics;  Laterality: Right;  134min  . TENDON REPAIR     right hand  . TOTAL HIP ARTHROPLASTY Right 05/25/2014   Procedure: RIGHT TOTAL HIP ARTHROPLASTY ANTERIOR APPROACH;  Surgeon: Rod Can, MD;  Location: [facility];  Service: Orthopedics;  Laterality: Right;  . TOTAL HIP ARTHROPLASTY Left 03/27/2018   Procedure: TOTAL HIP ARTHROPLASTY ANTERIOR APPROACH- DA complex;  Surgeon: Rod Can, MD;  [facility];  Service: Orthopedics;  Laterality: Left;  . TOTAL KNEE ARTHROPLASTY Bilateral     Current Outpatient Medications  Medication Sig Dispense Refill  . acetaminophen (TYLENOL) 325 MG tablet Take 650 mg by mouth every 6 (six) hours as needed for mild pain or headache.    [name] apixaban (ELIQUIS) 5 MG TABS tablet TAKE 1 TABLET BY MOUTH TWICE DAILY 180 tablet 2  . atorvastatin (LIPITOR) 80 MG tablet TAKE 1 TABLET BY MOUTH DAILY AT 6PM 90 tablet 3  . cyclobenzaprine (FLEXERIL) 10 MG tablet TAKE 2 TABLETS (20 MG TOTAL) BY MOUTH 2 (TWO) TIMES DAILY. 360 tablet 3  . diltiazem (CARDIZEM CD) 120 MG 24 hr capsule TAKE 1 CAPSULE (120 MG TOTAL) BY MOUTH DAILY. 90 capsule 3  . docusate sodium (COLACE) 100 MG capsule Take 1 capsule (100 mg total)  by mouth 2 (two) times daily. 60 capsule 1  . dronedarone (MULTAQ) 400 MG tablet TAKE 1 TABLET BY MOUTH TWICE DAILY WITH A MEAL 180 tablet 1  . DULoxetine (CYMBALTA) 60 MG capsule TAKE 1 CAPSULE (60 MG TOTAL) BY MOUTH AT BEDTIME. 90 capsule 3  . lisinopril-hydrochlorothiazide (ZESTORETIC) 20-25 MG tablet TAKE 1 TABLET BY MOUTH DAILY. 90 tablet 3  . metoprolol succinate (TOPROL-XL) 100 MG 24 hr tablet Take 1 tablet (100 mg total) by mouth every morning with or immediately following a meal 90 tablet 3  . nitroGLYCERIN (NITROSTAT) 0.4 MG SL tablet Place 1 tablet (0.4 mg total) under the tongue every 5 (five) minutes x 3 doses as needed for chest pain. If taking third dose, call 911. 25 tablet 3  . sildenafil (VIAGRA) 100 MG tablet Take 1 tablet (100 mg total) by mouth daily as needed. Avoid  use with nitroglycerin 30 tablet 11  . traMADol (ULTRAM) 50 MG tablet Take 1 tablet(s) by mouth EVERY 6 HOURS as needed for pain. 30 tablet 0  . Vitamin D, Ergocalciferol, (DRISDOL) 1.25 MG (50000 UNIT) CAPS capsule TAKE 1 CAPSULE (50,000 UNITS TOTAL) BY MOUTH EVERY 7 (SEVEN) DAYS. 4 capsule 0  . zolpidem (AMBIEN) 10 MG tablet TAKE 1 TABLET BY MOUTH EVERY NIGHT AT BEDTIME AS NEEDED FOR SLEEP 90 tablet 1   No current facility-administered medications for this encounter.    Allergies  Allergen Reactions  . Ketamine     hallucinations  . Morphine And Related Itching    Social History   Socioeconomic History  . Marital status: Married    Spouse name: [name] "[name]" Sennett  . Number of children: 2  . Years of education: 40  . Highest education level: Not on file  Occupational History  . Occupation: Chiropractor    Comment: [id number]  Tobacco Use  . Smoking status: Former Smoker    Packs/day: 1.00    Years: 25.00    Pack years: 25.00    Types: Cigarettes    Quit date: 2000    Years since quitting: 22.4  . Smokeless tobacco: Current User    Types: Snuff  Vaping Use  . Vaping Use: Never  used  Substance and Sexual Activity  . Alcohol use: Yes    Alcohol/week: 14.0 standard drinks    Types: 14 Cans of beer per week    Comment: 2 beers QD  . Drug use: No  . Sexual activity: Not on file  Other Topics Concern  . Not on file  Social History Narrative   Lives with wife in a one story home.  Has 2 children.  Works as a Public relations account executive.  Education: high school.    Social Determinants of Health   Financial Resource Strain: Not on file  Food Insecurity: Not on file  Transportation Needs: Not on file  Physical Activity: Not on file  Stress: Not on file  Social Connections: Not on file  Intimate Partner Violence: Not on file     ROS- All systems are reviewed and negative except as per the HPI above.  Physical Exam: Vitals:   06/01/20 1438  BP: 116/88  Pulse: 63  Weight: 134.4 kg  Height: 6' (1.829 m)    GEN- The patient is a well appearing obese male, alert and oriented x 3 today.   HEENT-head normocephalic, atraumatic, sclera clear, conjunctiva pink, hearing intact, trachea midline. Lungs- Clear to ausculation bilaterally, normal work of breathing Heart- Regular rate and rhythm, no murmurs, rubs or gallops  GI- soft, NT, ND, + BS Extremities- no clubbing, cyanosis, or edema MS- no significant deformity or atrophy Skin- no rash or lesion Psych- euthymic mood, full affect Neuro- strength and sensation are intact   Wt Readings from Last 3 Encounters:  06/01/20 134.4 kg  05/28/20 134.3 kg  05/17/20 (!) 136.7 kg    EKG today demonstrates  SR, inc RBBB, slow R wave prog Vent. rate 63 BPM PR interval 172 ms QRS duration 106 ms QT/QTcB 424/433 ms  Echo 09/30/18 demonstrated  1. Left ventricular ejection fraction, by visual estimation, is 65 to  70%. The left ventricle has normal function. Normal left ventricular size.  There is mildly increased left ventricular hypertrophy.  2. Definity contrast agent was given IV to delineate the left ventricular   endocardial borders.  3. Left ventricular diastolic Doppler parameters are indeterminate  pattern of LV diastolic filling.  4. Global right ventricle has normal systolic function.The right  ventricular size is normal. No increase in right ventricular wall  thickness.  5. The left ventricular function has improved from previous echo   Epic records are reviewed at length today  CHA2DS2-VASc Score = 5  The patient's score is based upon: CHF History: Yes HTN History: Yes Diabetes History: No Stroke History: Yes (recurrent VTEs) Vascular Disease History: Yes Age Score: 0 Gender Score: 0      ASSESSMENT AND PLAN: 1. Paroxysmal Atrial Fibrillation (ICD10:  I48.0) The patient's CHA2DS2-VASc score is 5, indicating a 7.2% annual risk of stroke.  Patient in [place] today. OK for surgery from an afib standpoint with very brief episodes, none for several days.  Unclear how much of his symptoms are from afib given that his last cardiac monitor showed no arrhythmias associated with his symptoms. We discussed the role of ILR today and patient is very interested and would like to consider the device before ablation or dofetilide.  Continue Multaq 400 mg BID Continue Toprol 100 mg daily Continue Eliquis 5 mg BID. Hold anticoagulation for 2 days prior to surgery per pharmacy recs.  Continue diltiazem 120 mg daily  2. Secondary Hypercoagulable State (ICD10:  D68.69) The patient is at significant risk for stroke/thromboembolism based upon his CHA2DS2-VASc Score of 5.  Continue Apixaban (Eliquis).   3. Obesity Body mass index is 40.2 kg/m. Lifestyle modification was discussed and encouraged including regular physical activity and weight reduction. [facility] 47 lbs total lost!   4. CAD No anginal symptoms.  5. Cardiomyopathy EF normalized.    Follow up with Dr Curt Bears as scheduled.    [facility] [address] [place], [place] [address] [phone] 06/01/2020 2:46 PM

## 2020-06-02 NOTE — Telephone Encounter (Signed)
    LEEANDRE NORDLING DOB:  Sep 09, 1960  MRN:  [id number]   Primary Cardiologist: Sinclair Grooms, MD  Chart reviewed as part of pre-operative protocol coverage. Given past medical history and time since last visit, based on ACC/AHA guidelines, KOLBE DELMONACO would be at acceptable risk for the planned procedure without further cardiovascular testing.   Per visit with atrial fibrillation clinic [place], [place] " OK for surgery from an afib standpoint with very brief episodes, none for several days. "  Per Dr. Tamala Julian and pharmacy may hold Eliquis 2 days prior to planned procedure.   The patient was advised that if he develops new symptoms prior to surgery to contact our office to arrange for a follow-up visit, and he verbalized understanding.  I will route this recommendation to the requesting party via Epic fax function and remove from pre-op pool.  Please call with questions.  Loel Dubonnet, NP 06/02/2020, 9:19 AM

## 2020-06-03 ENCOUNTER — Other Ambulatory Visit (HOSPITAL_BASED_OUTPATIENT_CLINIC_OR_DEPARTMENT_OTHER): Payer: Self-pay

## 2020-06-03 ENCOUNTER — Encounter (INDEPENDENT_AMBULATORY_CARE_PROVIDER_SITE_OTHER): Payer: Self-pay | Admitting: Family Medicine

## 2020-06-03 ENCOUNTER — Other Ambulatory Visit: Payer: Self-pay

## 2020-06-03 ENCOUNTER — Ambulatory Visit (INDEPENDENT_AMBULATORY_CARE_PROVIDER_SITE_OTHER): Payer: 59 | Admitting: Family Medicine

## 2020-06-03 VITALS — BP 135/89 | HR 72 | Temp 98.2°F | Ht 71.0 in | Wt 288.0 lb

## 2020-06-03 DIAGNOSIS — Z9189 Other specified personal risk factors, not elsewhere classified: Secondary | ICD-10-CM

## 2020-06-03 DIAGNOSIS — Z6841 Body Mass Index (BMI) 40.0 and over, adult: Secondary | ICD-10-CM

## 2020-06-03 DIAGNOSIS — R7303 Prediabetes: Secondary | ICD-10-CM

## 2020-06-03 DIAGNOSIS — E559 Vitamin D deficiency, unspecified: Secondary | ICD-10-CM | POA: Diagnosis not present

## 2020-06-03 MED ORDER — VITAMIN D (ERGOCALCIFEROL) 1.25 MG (50000 UNIT) PO CAPS
ORAL_CAPSULE | ORAL | 0 refills | Status: DC
  Filled 2020-06-03: qty 4, fill #0

## 2020-06-03 NOTE — Progress Notes (Signed)
Anesthesia Chart Review   Case: [id number] Date/Time: 06/10/20 1215   Procedure: REVERSE SHOULDER ARTHROPLASTY (Left Shoulder) - 134min   Anesthesia type: General   Pre-op diagnosis: Left shoulder rotator cuff tear arthropathy   Location: [facility] ROOM 06 / [facility]   Surgeons: Justice Britain, MD      DISCUSSION:60 y.o. former smoker with h/o HTN, PAF (on Eliquis), CAD, left shoulder rotator cuff tear scheduled for above procedure 06/10/2020 with Dr. Justice Britain.   Per cardiology preoperative evaluation 06/02/20, "Chart reviewed as part of pre-operative protocol coverage. Given past medical history and time since last visit, based on ACC/AHA guidelines, Travis Huff would be at acceptable risk for the planned procedure without further cardiovascular testing.   Per visit with atrial fibrillation clinic [place], [place] " OK for surgery from an afib standpoint with very brief episodes, none for several days. "  Per Dr. Tamala Julian and pharmacy may hold Eliquis 2 days prior to planned procedure."  Anticipate pt can proceed with planned procedure barring acute status change.   VS: BP (!) 125/91   Pulse 69   Temp 37.1 C (Oral)   Resp 20   Ht 6' (1.829 m)   Wt 134.3 kg   SpO2 99%   BMI 40.14 kg/m   PROVIDERS: Silverio Decamp, MD is PCP   Allegra Lai, MD is EP  Daneen Schick, MD is Cardiologist  LABS: Labs reviewed: Acceptable for surgery. (all labs ordered are listed, but only abnormal results are displayed)  Labs Reviewed  HEMOGLOBIN A1C - Abnormal; Notable for the following components:      Result Value   Hgb A1c MFr Bld 5.8 (*)    All other components within normal limits  BASIC METABOLIC PANEL - Abnormal; Notable for the following components:   Glucose, Bld 106 (*)    All other components within normal limits  SURGICAL PCR SCREEN  CBC     IMAGES:   EKG: 06/01/2020 Rate 63 bpm Normal sinus rhythm Right superior axis deviation Incomplete right bundle branch  block Possible Right ventricular hypertrophy Cannot rule out Anterior infarct , age undetermined Abnormal ECG No significant change since last tracing  CV: Echo 09/30/2018 1. Left ventricular ejection fraction, by visual estimation, is 65 to  70%. The left ventricle has normal function. Normal left ventricular size.  There is mildly increased left ventricular hypertrophy.  2. Definity contrast agent was given IV to delineate the left ventricular  endocardial borders.  3. Left ventricular diastolic Doppler parameters are indeterminate  pattern of LV diastolic filling.  4. Global right ventricle has normal systolic function.The right  ventricular size is normal. No increase in right ventricular wall  thickness.  5. The left ventricular function has improved from previous echo  Past Medical History:  Diagnosis Date  . Arrhythmia   . Arthritis    shoulders, knees,  back,hips  . Atrial fibrillation ([place])   . Avascular necrosis of bone of left hip ([place])   . Back pain   . Balance problem   . Blood clot in vein 06/2016   x 2 no bleeding disorders found after back surgery  . Complication of anesthesia    ketamine hallucinations  . Constipation   . Coronary artery disease   . Dislocated hip, left, initial encounter ([facility]) 10/03/2018  . Dysrhythmia 2020   a-fib  . Hx of blood clots   . Hypertension   . Joint pain   . Neuromuscular disorder ([facility])    neuropathy  bi lat legs knee down  . Neuropathy   . PAF (paroxysmal atrial fibrillation) ([place])   . Pre-diabetes   . Prediabetes   . Shakes   . SOBOE (shortness of breath on exertion)   . Swelling of both lower extremities   . Viral pericarditis 20 yrs ago    Past Surgical History:  Procedure Laterality Date  . ABDOMINAL EXPOSURE N/A 06/23/2016   Procedure: ABDOMINAL EXPOSURE;  Surgeon: Rosetta Posner, MD;  Location: [facility];  Service: Vascular;  Laterality: N/A;  . ANTERIOR LATERAL LUMBAR FUSION 4 LEVELS Right 06/23/2016    Procedure: Right  Lumbar two-three Lumbar three-four Lumbar four-five Anterolateral lumbar interbody fusion;  Surgeon: Erline Levine, MD;  Location: [facility];  Service: Neurosurgery;  Laterality: Right;  . ANTERIOR LUMBAR FUSION N/A 06/23/2016   Procedure: Lumbar five -sacral one  Anterior lumbar interbody fusion with Dr. Sherren Mocha Early for approach;  Surgeon: Erline Levine, MD;  Location: [facility];  Service: Neurosurgery;  Laterality: N/A;  . APPLICATION OF INTRAOPERATIVE CT SCAN N/A 06/26/2016   Procedure: APPLICATION OF INTRAOPERATIVE CT SCAN;  Surgeon: Erline Levine, MD;  Location: [facility];  Service: Neurosurgery;  Laterality: N/A;  . BACK SURGERY  2018  . CARDIAC CATHETERIZATION    . CARDIOVASCULAR STRESS TEST     Negative in May 2014  . COLONOSCOPY    . HERNIA REPAIR     umbilical  . HIP CLOSED REDUCTION Left 10/03/2018   Procedure: CLOSED REDUCTION HIP;  Surgeon: Rod Can, MD;  [facility];  Service: Orthopedics;  Laterality: Left;  . IR IVC FILTER PLMT / S&I Burke Keels GUID/MOD SED  03/26/2018  . IR RADIOLOGIST EVAL & MGMT  04/16/2019  . JOINT REPLACEMENT    . LEFT HEART CATH AND CORONARY ANGIOGRAPHY N/A 06/21/2018   Procedure: LEFT HEART CATH AND CORONARY ANGIOGRAPHY;  Surgeon: Jettie Booze, MD;  Location: [facility];  Service: Cardiovascular;  Laterality: N/A;  . POSTERIOR LUMBAR FUSION 4 LEVEL N/A 06/26/2016   Procedure: Thoracic ten to Pelvis fixation with AIRO;  Surgeon: Erline Levine, MD;  Location: [facility];  Service: Neurosurgery;  Laterality: N/A;  . REVERSE SHOULDER ARTHROPLASTY Right 09/18/2019   Procedure: REVERSE SHOULDER ARTHROPLASTY;  Surgeon: Justice Britain, MD;  Location[facility];  Service: Orthopedics;  Laterality: Right;  175min  . TENDON REPAIR     right hand  . TOTAL HIP ARTHROPLASTY Right 05/25/2014   Procedure: RIGHT TOTAL HIP ARTHROPLASTY ANTERIOR APPROACH;  Surgeon: Rod Can, MD;  Location: [facility];  Service: Orthopedics;  Laterality: Right;  . TOTAL HIP  ARTHROPLASTY Left 03/27/2018   Procedure: TOTAL HIP ARTHROPLASTY ANTERIOR APPROACH- DA complex;  Surgeon: Rod Can, MD;  [facility];  Service: Orthopedics;  Laterality: Left;  . TOTAL KNEE ARTHROPLASTY Bilateral     MEDICATIONS: . acetaminophen (TYLENOL) 325 MG tablet  . apixaban (ELIQUIS) 5 MG TABS tablet  . atorvastatin (LIPITOR) 80 MG tablet  . cyclobenzaprine (FLEXERIL) 10 MG tablet  . diltiazem (CARDIZEM CD) 120 MG 24 hr capsule  . docusate sodium (COLACE) 100 MG capsule  . dronedarone (MULTAQ) 400 MG tablet  . DULoxetine (CYMBALTA) 60 MG capsule  . lisinopril-hydrochlorothiazide (ZESTORETIC) 20-25 MG tablet  . metoprolol succinate (TOPROL-XL) 100 MG 24 hr tablet  . nitroGLYCERIN (NITROSTAT) 0.4 MG SL tablet  . sildenafil (VIAGRA) 100 MG tablet  . traMADol (ULTRAM) 50 MG tablet  . Vitamin D, Ergocalciferol, (DRISDOL) 1.25 MG (50000 UNIT) CAPS capsule  . zolpidem (AMBIEN) 10  MG tablet   No current facility-administered medications for this encounter.    Konrad Felix, PA-C [facility] [phone]

## 2020-06-03 NOTE — Anesthesia Preprocedure Evaluation (Addendum)
Anesthesia Evaluation  Patient identified by MRN, date of birth, ID band Patient awake    Reviewed: Allergy & Precautions, NPO status , Patient's Chart, lab work & pertinent test results  Airway Mallampati: II  TM Distance: >3 FB Neck ROM: Full    Dental no notable dental hx.    Pulmonary neg pulmonary ROS, former smoker,    Pulmonary exam normal breath sounds clear to auscultation       Cardiovascular hypertension, + Past MI  Normal cardiovascular exam+ dysrhythmias Atrial Fibrillation  Rhythm:Regular Rate:Normal     Neuro/Psych negative neurological ROS  negative psych ROS   GI/Hepatic negative GI ROS, Neg liver ROS,   Endo/Other  Morbid obesity  Renal/GU negative Renal ROS  negative genitourinary   Musculoskeletal negative musculoskeletal ROS (+)   Abdominal   Peds negative pediatric ROS (+)  Hematology negative hematology ROS (+)   Anesthesia Other Findings   Reproductive/Obstetrics negative OB ROS                            Anesthesia Physical Anesthesia Plan  ASA: III  Anesthesia Plan: General   Post-op Pain Management:  Regional for Post-op pain   Induction: Intravenous  PONV Risk Score and Plan: 2 and Ondansetron, Dexamethasone and Treatment may vary due to age or medical condition  Airway Management Planned: Oral ETT  Additional Equipment:   Intra-op Plan:   Post-operative Plan: Extubation in OR  Informed Consent: I have reviewed the patients History and Physical, chart, labs and discussed the procedure including the risks, benefits and alternatives for the proposed anesthesia with the patient or authorized representative who has indicated his/her understanding and acceptance.     Dental advisory given  Plan Discussed with: CRNA and Surgeon  Anesthesia Plan Comments: (See PAT note 05/28/2020, Konrad Felix, PA-C)       Anesthesia Quick Evaluation

## 2020-06-07 ENCOUNTER — Other Ambulatory Visit (HOSPITAL_BASED_OUTPATIENT_CLINIC_OR_DEPARTMENT_OTHER): Payer: Self-pay

## 2020-06-10 ENCOUNTER — Ambulatory Visit (HOSPITAL_COMMUNITY)
Admission: RE | Admit: 2020-06-10 | Discharge: 2020-06-10 | Disposition: A | Discharge status: Home / Self Care | Payer: 59 | Source: Other Acute Inpatient Hospital | Attending: Orthopedic Surgery | Admitting: Orthopedic Surgery

## 2020-06-10 ENCOUNTER — Encounter (HOSPITAL_COMMUNITY): Payer: Self-pay | Admitting: Orthopedic Surgery

## 2020-06-10 ENCOUNTER — Ambulatory Visit (HOSPITAL_COMMUNITY): Payer: 59 | Admitting: Certified Registered Nurse Anesthetist

## 2020-06-10 ENCOUNTER — Other Ambulatory Visit (HOSPITAL_BASED_OUTPATIENT_CLINIC_OR_DEPARTMENT_OTHER): Payer: Self-pay

## 2020-06-10 ENCOUNTER — Ambulatory Visit (HOSPITAL_COMMUNITY): Payer: 59 | Admitting: Physician Assistant

## 2020-06-10 ENCOUNTER — Other Ambulatory Visit: Payer: Self-pay

## 2020-06-10 ENCOUNTER — Encounter (HOSPITAL_COMMUNITY)
Admission: RE | Disposition: A | Discharge status: Home / Self Care | Payer: Self-pay | Source: Other Acute Inpatient Hospital | Attending: Orthopedic Surgery

## 2020-06-10 DIAGNOSIS — Z79899 Other long term (current) drug therapy: Secondary | ICD-10-CM | POA: Diagnosis not present

## 2020-06-10 DIAGNOSIS — Z87891 Personal history of nicotine dependence: Secondary | ICD-10-CM | POA: Insufficient documentation

## 2020-06-10 DIAGNOSIS — I10 Essential (primary) hypertension: Secondary | ICD-10-CM | POA: Diagnosis not present

## 2020-06-10 DIAGNOSIS — G8918 Other acute postprocedural pain: Secondary | ICD-10-CM | POA: Diagnosis not present

## 2020-06-10 DIAGNOSIS — M75102 Unspecified rotator cuff tear or rupture of left shoulder, not specified as traumatic: Secondary | ICD-10-CM | POA: Insufficient documentation

## 2020-06-10 DIAGNOSIS — M19012 Primary osteoarthritis, left shoulder: Secondary | ICD-10-CM | POA: Diagnosis not present

## 2020-06-10 DIAGNOSIS — M12812 Other specific arthropathies, not elsewhere classified, left shoulder: Secondary | ICD-10-CM | POA: Diagnosis not present

## 2020-06-10 HISTORY — PX: REVERSE SHOULDER ARTHROPLASTY: SHX5054

## 2020-06-10 SURGERY — ARTHROPLASTY, SHOULDER, TOTAL, REVERSE
Anesthesia: General | Site: Shoulder | Laterality: Left

## 2020-06-10 MED ORDER — LACTATED RINGERS IV SOLN
INTRAVENOUS | Status: DC
  Administered 2020-06-10: 1000 mL via INTRAVENOUS

## 2020-06-10 MED ORDER — HYDROMORPHONE HCL 1 MG/ML IJ SOLN: 0.2500 mg | INTRAMUSCULAR | Status: DC | PRN

## 2020-06-10 MED ORDER — FENTANYL CITRATE (PF) 100 MCG/2ML IJ SOLN
INTRAMUSCULAR | Status: AC
  Filled 2020-06-10: qty 2

## 2020-06-10 MED ORDER — MIDAZOLAM HCL 5 MG/5ML IJ SOLN
INTRAMUSCULAR | Status: DC | PRN
  Administered 2020-06-10: 2 mg via INTRAVENOUS

## 2020-06-10 MED ORDER — OXYCODONE-ACETAMINOPHEN 5-325 MG PO TABS
1.0000 | ORAL_TABLET | ORAL | 0 refills | Status: DC | PRN
  Filled 2020-06-10: qty 20, 4d supply, fill #0

## 2020-06-10 MED ORDER — SODIUM CHLORIDE 0.9 % IR SOLN
Status: DC | PRN
  Administered 2020-06-10: 1000 mL

## 2020-06-10 MED ORDER — MIDAZOLAM HCL 2 MG/2ML IJ SOLN
INTRAMUSCULAR | Status: AC
  Filled 2020-06-10: qty 2

## 2020-06-10 MED ORDER — ROCURONIUM BROMIDE 10 MG/ML (PF) SYRINGE
PREFILLED_SYRINGE | INTRAVENOUS | Status: AC
  Filled 2020-06-10: qty 10

## 2020-06-10 MED ORDER — CEFAZOLIN IN SODIUM CHLORIDE 3-0.9 GM/100ML-% IV SOLN
3.0000 g | INTRAVENOUS | Status: AC
  Administered 2020-06-10: 3 g via INTRAVENOUS
  Filled 2020-06-10: qty 100

## 2020-06-10 MED ORDER — OXYCODONE HCL 5 MG PO TABS: 5.0000 mg | ORAL_TABLET | Freq: Once | ORAL | Status: DC | PRN

## 2020-06-10 MED ORDER — BUPIVACAINE HCL (PF) 0.5 % IJ SOLN
INTRAMUSCULAR | Status: DC | PRN
  Administered 2020-06-10: 20 mL via PERINEURAL

## 2020-06-10 MED ORDER — LACTATED RINGERS IV BOLUS
500.0000 mL | Freq: Once | INTRAVENOUS | Status: AC
  Administered 2020-06-10: 500 mL via INTRAVENOUS

## 2020-06-10 MED ORDER — LIDOCAINE 2% (20 MG/ML) 5 ML SYRINGE
INTRAMUSCULAR | Status: AC
  Filled 2020-06-10: qty 5

## 2020-06-10 MED ORDER — BUPIVACAINE LIPOSOME 1.3 % IJ SUSP
INTRAMUSCULAR | Status: DC | PRN
  Administered 2020-06-10: 10 mL

## 2020-06-10 MED ORDER — ROCURONIUM BROMIDE 10 MG/ML (PF) SYRINGE
PREFILLED_SYRINGE | INTRAVENOUS | Status: DC | PRN
  Administered 2020-06-10: 80 mg via INTRAVENOUS
  Administered 2020-06-10 (×2): 10 mg via INTRAVENOUS

## 2020-06-10 MED ORDER — DEXAMETHASONE SODIUM PHOSPHATE 10 MG/ML IJ SOLN
INTRAMUSCULAR | Status: DC | PRN
  Administered 2020-06-10: 10 mg via INTRAVENOUS

## 2020-06-10 MED ORDER — ONDANSETRON HCL 4 MG/2ML IJ SOLN
INTRAMUSCULAR | Status: AC
  Filled 2020-06-10: qty 2

## 2020-06-10 MED ORDER — LACTATED RINGERS IV BOLUS: 250.0000 mL | Freq: Once | INTRAVENOUS | Status: DC

## 2020-06-10 MED ORDER — CYCLOBENZAPRINE HCL 10 MG PO TABS
10.0000 mg | ORAL_TABLET | Freq: Three times a day (TID) | ORAL | 1 refills | Status: DC | PRN
  Filled 2020-06-10 – 2020-08-04 (×3): qty 30, 10d supply, fill #0

## 2020-06-10 MED ORDER — PHENYLEPHRINE HCL-NACL 10-0.9 MG/250ML-% IV SOLN
INTRAVENOUS | Status: DC | PRN
  Administered 2020-06-10: 50 ug/min via INTRAVENOUS

## 2020-06-10 MED ORDER — DEXAMETHASONE SODIUM PHOSPHATE 10 MG/ML IJ SOLN
INTRAMUSCULAR | Status: AC
  Filled 2020-06-10: qty 1

## 2020-06-10 MED ORDER — SUGAMMADEX SODIUM 500 MG/5ML IV SOLN
INTRAVENOUS | Status: AC
  Filled 2020-06-10: qty 5

## 2020-06-10 MED ORDER — SUGAMMADEX SODIUM 200 MG/2ML IV SOLN
INTRAVENOUS | Status: DC | PRN
  Administered 2020-06-10: 400 mg via INTRAVENOUS

## 2020-06-10 MED ORDER — FENTANYL CITRATE (PF) 100 MCG/2ML IJ SOLN
50.0000 ug | INTRAMUSCULAR | Status: DC
  Administered 2020-06-10: 100 ug via INTRAVENOUS
  Filled 2020-06-10: qty 2

## 2020-06-10 MED ORDER — ONDANSETRON HCL 4 MG PO TABS
4.0000 mg | ORAL_TABLET | Freq: Three times a day (TID) | ORAL | 0 refills | Status: DC | PRN
  Filled 2020-06-10: qty 10, 4d supply, fill #0

## 2020-06-10 MED ORDER — MIDAZOLAM HCL 2 MG/2ML IJ SOLN
1.0000 mg | INTRAMUSCULAR | Status: DC
  Administered 2020-06-10: 2 mg via INTRAVENOUS
  Filled 2020-06-10: qty 2

## 2020-06-10 MED ORDER — SODIUM CHLORIDE (PF) 0.9 % IJ SOLN
INTRAMUSCULAR | Status: AC
  Filled 2020-06-10: qty 10

## 2020-06-10 MED ORDER — TRANEXAMIC ACID-NACL 1000-0.7 MG/100ML-% IV SOLN
1000.0000 mg | INTRAVENOUS | Status: AC
  Administered 2020-06-10: 1000 mg via INTRAVENOUS
  Filled 2020-06-10: qty 100

## 2020-06-10 MED ORDER — LIDOCAINE 2% (20 MG/ML) 5 ML SYRINGE
INTRAMUSCULAR | Status: DC | PRN
  Administered 2020-06-10: 100 mg via INTRAVENOUS

## 2020-06-10 MED ORDER — CHLORHEXIDINE GLUCONATE 0.12 % MT SOLN
15.0000 mL | Freq: Once | OROMUCOSAL | Status: AC
  Administered 2020-06-10: 15 mL via OROMUCOSAL

## 2020-06-10 MED ORDER — ACETAMINOPHEN 10 MG/ML IV SOLN
1000.0000 mg | Freq: Once | INTRAVENOUS | Status: DC | PRN
Start: 2020-06-10 — End: 2020-06-10

## 2020-06-10 MED ORDER — OXYCODONE HCL 5 MG/5ML PO SOLN: 5.0000 mg | Freq: Once | ORAL | Status: DC | PRN

## 2020-06-10 MED ORDER — PROPOFOL 10 MG/ML IV BOLUS
INTRAVENOUS | Status: AC
  Filled 2020-06-10: qty 20

## 2020-06-10 MED ORDER — ONDANSETRON HCL 4 MG/2ML IJ SOLN: 4.0000 mg | Freq: Once | INTRAMUSCULAR | Status: DC | PRN

## 2020-06-10 MED ORDER — ONDANSETRON HCL 4 MG/2ML IJ SOLN
INTRAMUSCULAR | Status: DC | PRN
  Administered 2020-06-10: 4 mg via INTRAVENOUS

## 2020-06-10 MED ORDER — ORAL CARE MOUTH RINSE: 15.0000 mL | Freq: Once | OROMUCOSAL | Status: AC

## 2020-06-10 MED ORDER — VANCOMYCIN HCL 1000 MG IV SOLR
INTRAVENOUS | Status: AC
  Filled 2020-06-10: qty 1000

## 2020-06-10 MED ORDER — VASOPRESSIN 20 UNIT/ML IV SOLN
INTRAVENOUS | Status: DC | PRN
  Administered 2020-06-10 (×3): 1 [IU] via INTRAVENOUS

## 2020-06-10 MED ORDER — VASOPRESSIN 20 UNIT/ML IV SOLN
INTRAVENOUS | Status: AC
  Filled 2020-06-10: qty 1

## 2020-06-10 MED ORDER — VANCOMYCIN HCL 1000 MG IV SOLR
INTRAVENOUS | Status: DC | PRN
  Administered 2020-06-10: 1000 mg via TOPICAL

## 2020-06-10 MED ORDER — PROPOFOL 10 MG/ML IV BOLUS
INTRAVENOUS | Status: DC | PRN
  Administered 2020-06-10: 200 mg via INTRAVENOUS
  Administered 2020-06-10: 20 mg via INTRAVENOUS

## 2020-06-10 MED ORDER — EPHEDRINE SULFATE-NACL 50-0.9 MG/10ML-% IV SOSY
PREFILLED_SYRINGE | INTRAVENOUS | Status: DC | PRN
  Administered 2020-06-10 (×3): 10 mg via INTRAVENOUS

## 2020-06-10 MED ORDER — FENTANYL CITRATE (PF) 100 MCG/2ML IJ SOLN
INTRAMUSCULAR | Status: DC | PRN
  Administered 2020-06-10 (×2): 50 ug via INTRAVENOUS
  Administered 2020-06-10: 100 ug via INTRAVENOUS

## 2020-06-10 SURGICAL SUPPLY — 64 items
BAG ZIPLOCK 12X15 (MISCELLANEOUS) ×3 IMPLANT
BLADE SAW SGTL 83.5X18.5 (BLADE) ×3 IMPLANT
COOLER ICEMAN CLASSIC (MISCELLANEOUS) IMPLANT
COVER BACK TABLE 60X90IN (DRAPES) ×3 IMPLANT
COVER SURGICAL LIGHT HANDLE (MISCELLANEOUS) ×3 IMPLANT
COVER WAND RF STERILE (DRAPES) IMPLANT
CUP SUT UNIV REVERS 39 NEU (Shoulder) ×3 IMPLANT
DERMABOND ADVANCED (GAUZE/BANDAGES/DRESSINGS) ×2
DERMABOND ADVANCED .7 DNX12 (GAUZE/BANDAGES/DRESSINGS) ×1 IMPLANT
DRAPE INCISE IOBAN 66X45 STRL (DRAPES) IMPLANT
DRAPE ORTHO SPLIT 77X108 STRL (DRAPES) ×4
DRAPE SHEET LG 3/4 BI-LAMINATE (DRAPES) ×3 IMPLANT
DRAPE SURG 17X11 SM STRL (DRAPES) ×3 IMPLANT
DRAPE SURG ORHT 6 SPLT 77X108 (DRAPES) ×2 IMPLANT
DRAPE U-SHAPE 47X51 STRL (DRAPES) ×3 IMPLANT
DRESSING AQUACEL AG SP 3.5X6 (GAUZE/BANDAGES/DRESSINGS) ×1 IMPLANT
DRSG AQUACEL AG ADV 3.5X10 (GAUZE/BANDAGES/DRESSINGS) ×3 IMPLANT
DRSG AQUACEL AG SP 3.5X6 (GAUZE/BANDAGES/DRESSINGS) ×3
DURAPREP 26ML APPLICATOR (WOUND CARE) ×3 IMPLANT
ELECT BLADE TIP CTD 4 INCH (ELECTRODE) ×3 IMPLANT
ELECT REM PT RETURN 15FT ADLT (MISCELLANEOUS) ×3 IMPLANT
FACESHIELD WRAPAROUND (MASK) ×12 IMPLANT
GLENOID UNI REV MOD 24 +2 LAT (Joint) ×3 IMPLANT
GLENOSPHERE 39+4 LAT/24 UNI RV (Joint) ×3 IMPLANT
GLOVE SRG 8 PF TXTR STRL LF DI (GLOVE) ×1 IMPLANT
GLOVE SURG ENC MOIS LTX SZ7 (GLOVE) ×3 IMPLANT
GLOVE SURG ENC MOIS LTX SZ7.5 (GLOVE) ×3 IMPLANT
GLOVE SURG UNDER POLY LF SZ7 (GLOVE) ×3 IMPLANT
GLOVE SURG UNDER POLY LF SZ8 (GLOVE) ×3
GOWN STRL REUS W/TWL LRG LVL3 (GOWN DISPOSABLE) ×6 IMPLANT
INSERT HUMERAL 39/+6 (Insert) ×3 IMPLANT
KIT BASIN OR (CUSTOM PROCEDURE TRAY) ×3 IMPLANT
KIT TURNOVER KIT A (KITS) ×3 IMPLANT
MANIFOLD NEPTUNE II (INSTRUMENTS) ×3 IMPLANT
NEEDLE TAPERED W/ NITINOL LOOP (MISCELLANEOUS) ×3 IMPLANT
NS IRRIG 1000ML POUR BTL (IV SOLUTION) ×3 IMPLANT
PACK SHOULDER (CUSTOM PROCEDURE TRAY) ×3 IMPLANT
PAD ARMBOARD 7.5X6 YLW CONV (MISCELLANEOUS) ×3 IMPLANT
PAD COLD SHLDR WRAP-ON (PAD) IMPLANT
PIN NITINOL TARGETER 2.8 (PIN) IMPLANT
PIN SET MODULAR GLENOID SYSTEM (PIN) IMPLANT
RESTRAINT HEAD UNIVERSAL NS (MISCELLANEOUS) ×3 IMPLANT
SCREW CENTRAL MOD 30MM (Screw) ×3 IMPLANT
SCREW PERI LOCK 5.5X24 (Screw) ×3 IMPLANT
SCREW PERIPHERAL 5.5X20 LOCK (Screw) ×6 IMPLANT
SCREW PERIPHERAL 5.5X28 LOCK (Screw) ×3 IMPLANT
SLING ARM FOAM STRAP LRG (SOFTGOODS) IMPLANT
SLING ARM FOAM STRAP MED (SOFTGOODS) IMPLANT
SLING ARM IMMOBILIZER XL (CAST SUPPLIES) ×3 IMPLANT
SPONGE LAP 18X18 RF (DISPOSABLE) ×3 IMPLANT
STEM HUMERAL UNI REVERS SZ9 (Stem) ×3 IMPLANT
SUCTION FRAZIER HANDLE 12FR (TUBING) ×3
SUCTION TUBE FRAZIER 12FR DISP (TUBING) ×1 IMPLANT
SUT FIBERWIRE #2 38 T-5 BLUE (SUTURE)
SUT MNCRL AB 3-0 PS2 18 (SUTURE) ×3 IMPLANT
SUT MON AB 2-0 CT1 36 (SUTURE) ×3 IMPLANT
SUT VIC AB 1 CT1 36 (SUTURE) ×3 IMPLANT
SUTURE FIBERWR #2 38 T-5 BLUE (SUTURE) IMPLANT
SUTURE TAPE 1.3 40 TPR END (SUTURE) ×2 IMPLANT
SUTURETAPE 1.3 40 TPR END (SUTURE) ×6
TOWEL OR 17X26 10 PK STRL BLUE (TOWEL DISPOSABLE) ×3 IMPLANT
TOWEL OR NON WOVEN STRL DISP B (DISPOSABLE) ×3 IMPLANT
WATER STERILE IRR 1000ML POUR (IV SOLUTION) ×6 IMPLANT
YANKAUER SUCT BULB TIP 10FT TU (MISCELLANEOUS) ×3 IMPLANT

## 2020-06-10 NOTE — Anesthesia Procedure Notes (Signed)
Anesthesia Procedure Image    

## 2020-06-10 NOTE — Anesthesia Procedure Notes (Signed)
Procedure Name: Intubation Date/Time: 06/10/2020 12:34 PM Performed by: Mitzie Na, CRNA Pre-anesthesia Checklist: Patient identified, Emergency Drugs available, Suction available and Patient being monitored Patient Re-evaluated:Patient Re-evaluated prior to induction Oxygen Delivery Method: Circle system utilized Preoxygenation: Pre-oxygenation with 100% oxygen Induction Type: IV induction Ventilation: Mask ventilation without difficulty and Oral airway inserted - appropriate to patient size Laryngoscope Size: Mac and 3 Grade View: Grade I Tube type: Oral Tube size: 7.5 mm Number of attempts: 1 Airway Equipment and Method: Stylet and Oral airway Placement Confirmation: ETT inserted through vocal cords under direct vision, positive ETCO2 and breath sounds checked- equal and bilateral Secured at: 23 cm Tube secured with: Tape Dental Injury: Teeth and Oropharynx as per pre-operative assessment

## 2020-06-10 NOTE — Evaluation (Signed)
Occupational Therapy Evaluation Patient Details Name: Travis Huff MRN: [id number] DOB: 11-08-1960 Today's Date: 06/10/2020    History of Present Illness 60 year old man s/p L reverse TSA with a PMH hx of PEs, CAD, Arrhythmias, HTN, peripheral neuropathy and multiple joint replacements and prior R rTSA   Clinical Impression   Patient is a 60 year old man s/p shoulder replacement without functional use of left non-dominant upper extremity secondary to effects of surgery and interscalene block and shoulder precautions. Patient and wife familiar with shoulder precautions and education from prior surgery. Therapist provided education and instruction to patient and spouse in regards to exercises, precautions, positioning, donning upper extremity clothing and bathing while maintaining shoulder precautions, ice and edema management and donning/doffing sling. Patient and spouse verbalized understanding. Patient needs assistance with standing and ambulation secondary to baseline balance deficits and neuropathy. Patient needed assistance to donn shirt, shorts, and shoes and provided with instruction on compensatory strategies to perform ADLs. Patient to follow up with MD for further therapy needs.      Follow Up Recommendations  Follow surgeon's recommendation for DC plan and follow-up therapies;Supervision/Assistance - 24 hour    Equipment Recommendations  None recommended by OT    Recommendations for Other Services       Precautions / Restrictions Precautions Precautions: Shoulder Type of Shoulder Precautions: If sitting in controlled environment, ok to come out of sling to give neck a break. Please sleep in it to protect until follow up in office.     OK to use operative arm for feeding, hygiene and ADLs.   Ok to instruct Pendulums and lap slides as exercises. Ok to use operative arm within the following parameters for ADL purposes     New ROM (8/18)   Ok for PROM, AAROM, AROM within pain  tolerance and within the following ROM   ER 20   ABD 45   FE 60 Shoulder Interventions: Shoulder sling/immobilizer;At all times;Off for dressing/bathing/exercises Precaution Booklet Issued:  (handouts) Required Braces or Orthoses: Sling Restrictions Weight Bearing Restrictions: Yes LUE Weight Bearing: Weight bearing as tolerated      Mobility Bed Mobility                    Transfers Overall transfer level: Needs assistance Equipment used: None Transfers: Sit to/from Stand Sit to Stand: Mod assist;+2 safety/equipment         General transfer comment: Mod assist to stand from recliner and +2 for safety. Patient has mobility and balance issues at baseline.    Balance Overall balance assessment: Needs assistance Sitting-balance support: No upper extremity supported Sitting balance-Leahy Scale: Fair     Standing balance support: Single extremity supported Standing balance-Leahy Scale: Fair                             ADL either performed or assessed with clinical judgement   ADL                                         General ADL Comments: Needs assistance to don shirt and LB clothing.     Vision   Vision Assessment?: No apparent visual deficits     Perception     Praxis      Pertinent Vitals/Pain Pain Assessment: No/denies pain     Hand Dominance  Extremity/Trunk Assessment Upper Extremity Assessment Upper Extremity Assessment: LUE deficits/detail LUE Deficits / Details: limited by effects of interscalene block   Lower Extremity Assessment Lower Extremity Assessment:  (impaired sensation bilaterally secondary to peripheral neuropathy)   Cervical / Trunk Assessment Cervical / Trunk Assessment: Normal   Communication     Cognition Arousal/Alertness: Awake/alert Behavior During Therapy: WFL for tasks assessed/performed Overall Cognitive Status: Within Functional Limits for tasks assessed                                      General Comments       Exercises     Shoulder Instructions Shoulder Instructions Donning/doffing shirt without moving shoulder: Caregiver independent with task Method for sponge bathing under operated UE: Caregiver independent with task Donning/doffing sling/immobilizer: Caregiver independent with task Correct positioning of sling/immobilizer: Caregiver independent with task Pendulum exercises (written home exercise program): Caregiver independent with task ROM for elbow, wrist and digits of operated UE: Caregiver independent with task Sling wearing schedule (on at all times/off for ADL's): Caregiver independent with task Proper positioning of operated UE when showering: Caregiver independent with task Dressing change: Caregiver independent with task Positioning of UE while sleeping: Caregiver independent with task    Home Living Family/patient expects to be discharged to:: Private residence Living Arrangements: Spouse/significant other Available Help at Discharge: Family;Available 24 hours/day                                    Prior Functioning/Environment                   OT Problem List: Decreased strength;Decreased range of motion;Impaired UE functional use;Pain;Impaired balance (sitting and/or standing)      OT Treatment/Interventions:      OT Goals(Current goals can be found in the care plan section) Acute Rehab OT Goals OT Goal Formulation: All assessment and education complete, DC therapy  OT Frequency:     Barriers to D/C:            Co-evaluation              AM-PAC OT "6 Clicks" Daily Activity     Outcome Measure Help from another person eating meals?: A Little Help from another person taking care of personal grooming?: A Little Help from another person toileting, which includes using toliet, bedpan, or urinal?: A Little Help from another person bathing (including washing, rinsing, drying)?: A  Little Help from another person to put on and taking off regular upper body clothing?: A Lot Help from another person to put on and taking off regular lower body clothing?: A Lot 6 Click Score: 16   End of Session Nurse Communication:  (OT education complete)  Activity Tolerance: Patient tolerated treatment well Patient left: in chair  OT Visit Diagnosis: Unsteadiness on feet (R26.81);Pain                Time: 7902-4097 OT Time Calculation (min): 17 min Charges:  OT General Charges $OT Visit: 1 Visit OT Evaluation $OT Eval Low Complexity: 1 Low  Malakai Schoenherr, OTR/L [place] [phone] Pager: [place] 06/10/2020, 4:38 PM

## 2020-06-10 NOTE — Progress Notes (Signed)
Assisted Dr. Kalman Shan with left, ultrasound guided, interscalene  block. Side rails up, monitors on throughout procedure. See vital signs in flow sheet. Tolerated Procedure well.

## 2020-06-10 NOTE — Anesthesia Procedure Notes (Signed)
Anesthesia Regional Block: Interscalene brachial plexus block   Pre-Anesthetic Checklist: , timeout performed,  Correct Patient, Correct Site, Correct Laterality,  Correct Procedure, Correct Position, site marked,  Risks and benefits discussed,  Surgical consent,  Pre-op evaluation,  At surgeon's request and post-op pain management  Laterality: Left  Prep: chloraprep       Needles:  Injection technique: Single-shot  Needle Type: Echogenic Needle     Needle Length: 9cm      Additional Needles:   Procedures:, nerve stimulator,,, ultrasound used (permanent image in chart),,     Nerve Stimulator or Paresthesia:  Response: 0.48 mA  Additional Responses:   Narrative:  Start time: 06/10/2020 11:26 AM End time: 06/10/2020 11:35 AM Injection made incrementally with aspirations every 5 mL.  Performed by: Personally  Anesthesiologist: Myrtie Soman, MD  Additional Notes: Patient tolerated the procedure well without complications

## 2020-06-10 NOTE — Transfer of Care (Signed)
Immediate Anesthesia Transfer of Care Note  Patient: Travis Huff  Procedure(s) Performed: REVERSE SHOULDER ARTHROPLASTY (Left: Shoulder)  Patient Location: PACU  Anesthesia Type:General  Level of Consciousness: awake, alert  and oriented  Airway & Oxygen Therapy: Patient Spontanous Breathing and Patient connected to face mask oxygen  Post-op Assessment: Report given to RN and Post -op Vital signs reviewed and stable  Post vital signs: Reviewed and stable  Last Vitals:  Vitals Value Taken Time  BP 140/77 06/10/20 1433  Temp    Pulse 72 06/10/20 1437  Resp 17 06/10/20 1437  SpO2 100 % 06/10/20 1437  Vitals shown include unvalidated device data.  Last Pain:  Vitals:   06/10/20 1046  PainSc: 0-No pain         Complications: No notable events documented.

## 2020-06-10 NOTE — Anesthesia Postprocedure Evaluation (Signed)
Anesthesia Post Note  Patient: Travis Huff  Procedure(s) Performed: REVERSE SHOULDER ARTHROPLASTY (Left: Shoulder)     Patient location during evaluation: PACU Anesthesia Type: General Level of consciousness: awake and alert Pain management: pain level controlled Vital Signs Assessment: post-procedure vital signs reviewed and stable Respiratory status: spontaneous breathing, nonlabored ventilation, respiratory function stable and patient connected to nasal cannula oxygen Cardiovascular status: blood pressure returned to baseline and stable Postop Assessment: no apparent nausea or vomiting Anesthetic complications: no   No notable events documented.  Last Vitals:  Vitals:   06/10/20 1500 06/10/20 1515  BP: 105/70 105/76  Pulse: 70 65  Resp: 19 15  Temp:  36.4 C  SpO2: 98% 96%    Last Pain:  Vitals:   06/10/20 1500  PainSc: 0-No pain                 Jaymari Cromie S

## 2020-06-10 NOTE — H&P (Signed)
Travis Huff    Chief Complaint: Left shoulder rotator cuff tear arthropathy HPI: The patient is a 60 y.o. male with chronic left shoulder pain related to rotator cuff tear arthropathy.  He is well-known to our practice after a previous right shoulder reverse arthroplasty which she has done well with.  Due to his increasing pain and functional imitations he is brought to the operating room this time for planned left shoulder reverse arthroplasty  He does have a number of significant underlying medical comorbidities including cardiac arrhythmia for which he is followed closely by the cardiology service.  Past Medical History:  Diagnosis Date   Arrhythmia    Arthritis    shoulders, knees,  back,hips   Atrial fibrillation (HCC)    Avascular necrosis of bone of left hip (HCC)    Back pain    Balance problem    Blood clot in vein 06/2016   x 2 no bleeding disorders found after back surgery   Complication of anesthesia    ketamine hallucinations   Constipation    Coronary artery disease    Dislocated hip, left, initial encounter ([place]) 10/03/2018   Dysrhythmia 2020   a-fib   Hx of blood clots    Hypertension    Joint pain    Neuromuscular disorder (HCC)    neuropathy bi lat legs knee down   Neuropathy    PAF (paroxysmal atrial fibrillation) (HCC)    Pre-diabetes    Prediabetes    Shakes    SOBOE (shortness of breath on exertion)    Swelling of both lower extremities    Viral pericarditis 20 yrs ago    Past Surgical History:  Procedure Laterality Date   ABDOMINAL EXPOSURE N/A 06/23/2016   Procedure: ABDOMINAL EXPOSURE;  Surgeon: Rosetta Posner, MD;  Location: [facility];  Service: Vascular;  Laterality: N/A;   ANTERIOR LATERAL LUMBAR FUSION 4 LEVELS Right 06/23/2016   Procedure: Right  Lumbar two-three Lumbar three-four Lumbar four-five Anterolateral lumbar interbody fusion;  Surgeon: Erline Levine, MD;  Location: [facility];  Service: Neurosurgery;  Laterality: Right;   ANTERIOR LUMBAR  FUSION N/A 06/23/2016   Procedure: Lumbar five -sacral one  Anterior lumbar interbody fusion with Dr. Sherren Mocha Early for approach;  Surgeon: Erline Levine, MD;  Location: [facility];  Service: Neurosurgery;  Laterality: N/A;   APPLICATION OF INTRAOPERATIVE CT SCAN N/A 06/26/2016   Procedure: APPLICATION OF INTRAOPERATIVE CT SCAN;  Surgeon: Erline Levine, MD;  Location: [facility];  Service: Neurosurgery;  Laterality: N/A;   BACK SURGERY  2018   CARDIAC CATHETERIZATION     CARDIOVASCULAR STRESS TEST     Negative in May 2014   COLONOSCOPY     HERNIA REPAIR     umbilical   HIP CLOSED REDUCTION Left 10/03/2018   Procedure: CLOSED REDUCTION HIP;  Surgeon: Rod Can, MD;  Location: [facility];  Service: Orthopedics;  Laterality: Left;   IR IVC FILTER PLMT / S&I /IMG GUID/MOD SED  03/26/2018   IR RADIOLOGIST EVAL & MGMT  04/16/2019   JOINT REPLACEMENT     LEFT HEART CATH AND CORONARY ANGIOGRAPHY N/A 06/21/2018   Procedure: LEFT HEART CATH AND CORONARY ANGIOGRAPHY;  Surgeon: Jettie Booze, MD;  Location: [facility];  Service: Cardiovascular;  Laterality: N/A;   POSTERIOR LUMBAR FUSION 4 LEVEL N/A 06/26/2016   Procedure: Thoracic ten to Pelvis fixation with AIRO;  Surgeon: Erline Levine, MD;  Location: [facility];  Service: Neurosurgery;  Laterality: N/A;   REVERSE  SHOULDER ARTHROPLASTY Right 09/18/2019   Procedure: REVERSE SHOULDER ARTHROPLASTY;  Surgeon: Justice Britain, MD;  Location: [facility];  Service: Orthopedics;  Laterality: Right;  152min   TENDON REPAIR     right hand   TOTAL HIP ARTHROPLASTY Right 05/25/2014   Procedure: RIGHT TOTAL HIP ARTHROPLASTY ANTERIOR APPROACH;  Surgeon: Rod Can, MD;  Location: [facility];  Service: Orthopedics;  Laterality: Right;   TOTAL HIP ARTHROPLASTY Left 03/27/2018   Procedure: TOTAL HIP ARTHROPLASTY ANTERIOR APPROACH- DA complex;  Surgeon: Rod Can, MD;  Location: [facility];  Service: Orthopedics;  Laterality: Left;   TOTAL KNEE ARTHROPLASTY Bilateral     Family  History  Adopted: Yes  Problem Relation Age of Onset   Healthy Daughter    Healthy Daughter     Social History:  reports that he quit smoking about 22 years ago. His smoking use included cigarettes. He has a 25.00 pack-year smoking history. His smokeless tobacco use includes snuff. He reports current alcohol use of about 14.0 standard drinks of alcohol per week. He reports that he does not use drugs.   Medications Prior to Admission  Medication Sig Dispense Refill   acetaminophen (TYLENOL) 325 MG tablet Take 650 mg by mouth every 6 (six) hours as needed for mild pain or headache.     apixaban (ELIQUIS) 5 MG TABS tablet TAKE 1 TABLET BY MOUTH TWICE DAILY 180 tablet 2   atorvastatin (LIPITOR) 80 MG tablet TAKE 1 TABLET BY MOUTH DAILY AT 6PM 90 tablet 3   cyclobenzaprine (FLEXERIL) 10 MG tablet TAKE 2 TABLETS (20 MG TOTAL) BY MOUTH 2 (TWO) TIMES DAILY. 360 tablet 3   diltiazem (CARDIZEM CD) 120 MG 24 hr capsule TAKE 1 CAPSULE (120 MG TOTAL) BY MOUTH DAILY. 90 capsule 3   docusate sodium (COLACE) 100 MG capsule Take 1 capsule (100 mg total) by mouth 2 (two) times daily. 60 capsule 1   dronedarone (MULTAQ) 400 MG tablet TAKE 1 TABLET BY MOUTH TWICE DAILY WITH A MEAL 180 tablet 1   DULoxetine (CYMBALTA) 60 MG capsule TAKE 1 CAPSULE (60 MG TOTAL) BY MOUTH AT BEDTIME. 90 capsule 3   lisinopril-hydrochlorothiazide (ZESTORETIC) 20-25 MG tablet TAKE 1 TABLET BY MOUTH DAILY. 90 tablet 3   metoprolol succinate (TOPROL-XL) 100 MG 24 hr tablet Take 1 tablet (100 mg total) by mouth every morning with or immediately following a meal 90 tablet 3   nitroGLYCERIN (NITROSTAT) 0.4 MG SL tablet Place 1 tablet (0.4 mg total) under the tongue every 5 (five) minutes x 3 doses as needed for chest pain. If taking third dose, call 911. 25 tablet 3   sildenafil (VIAGRA) 100 MG tablet Take 1 tablet (100 mg total) by mouth daily as needed. Avoid use with nitroglycerin 30 tablet 11   traMADol (ULTRAM) 50 MG tablet Take 1  tablet(s) by mouth EVERY 6 HOURS as needed for pain. 30 tablet 0   zolpidem (AMBIEN) 10 MG tablet TAKE 1 TABLET BY MOUTH EVERY NIGHT AT BEDTIME AS NEEDED FOR SLEEP 90 tablet 1   Vitamin D, Ergocalciferol, (DRISDOL) 1.25 MG (50000 UNIT) CAPS capsule TAKE 1 CAPSULE (50,000 UNITS TOTAL) BY MOUTH EVERY 7 (SEVEN) DAYS. 4 capsule 0     Physical Exam: Left shoulder demonstrates painful and guarded motion as noted at his recent office visit.  He is grossly neurovascular intact in left upper extremity.  Plain radiographs were reviewed which do show a high riding humeral head and diffuse degenerative changes consistent with severe rotator cuff tear  arthropathy.  Vitals  Temp:  [97.9 F (36.6 C)] 97.9 F (36.6 C) (06/09 1020) Weight:  [130 kg] 130 kg (06/09 1046)  Assessment/Plan  Impression: Left shoulder rotator cuff tear arthropathy  Plan of Action: Procedure(s): REVERSE SHOULDER ARTHROPLASTY  Bensen Chadderdon M Khye Hochstetler 06/10/2020, 11:37 AM Contact # [phone]

## 2020-06-10 NOTE — Op Note (Signed)
06/10/2020  2:30 PM  PATIENT:   Travis Huff  60 y.o. male  PRE-OPERATIVE DIAGNOSIS:  Left shoulder rotator cuff tear arthropathy  POST-OPERATIVE DIAGNOSIS: Same  PROCEDURE: Left shoulder reverse arthroplasty utilizing a press-fit size 9 Arthrex stem with a neutral metaphysis, +6 polyethylene insert, 39/+4 glenosphere on a small/+2 baseplate  SURGEON:  [name] Hawes, Metta Clines M.D.  ASSISTANTS: Jenetta Loges, PA-C  ANESTHESIA:   General endotracheal and interscalene block with Exparel  EBL: 200 cc  SPECIMEN: None  Drains: None   PATIENT DISPOSITION:  PACU - hemodynamically stable.    PLAN OF CARE: Discharge to home after PACU  Brief history:  Ms. Haug is a 60 year old gentleman well-known to our practice after previous right shoulder reverse arthroplasty, brought to the operating room today for planned left shoulder reverse arthroplasty for the treatment of severe rotator cuff tear arthropathy.  Preoperatively, I counseled the patient regarding treatment options and risks versus benefits thereof.  Possible surgical complications were all reviewed including potential for bleeding, infection, neurovascular injury, persistent pain, loss of motion, anesthetic complication, failure of the implant, and possible need for additional surgery. They understand and accept and agrees with our planned procedure.   Procedure in detail:  After undergoing routine preop evaluation the patient received prophylactic antibiotics and interscalene block with Exparel was established in the holding area by the anesthesia department.  Patient was subsequently placed supine on the operating table and underwent the smooth induction of a general endotracheal anesthesia.  Placed into the beachchair position and appropriately padded and protected.  The left shoulder region was sterilely prepped and draped in standard fashion.  Timeout was called.  A deltopectoral approach was made to the left shoulder through  an approximate 10 cm incision.  Skin flaps were elevated dissection carried deeply and the deltopectoral interval was developed from proximal to distal with the vein taken laterally.  The conjoined tendon was mobilized and retracted medially and the upper centimeter of the pectoralis major tendon was tenotomized for exposure.  The long head bicep tendon was then tenodesed at the upper border the pectoralis major and the proximal segment excised.  The subscapularis was then divided from its insertion into the lesser tuberosity and tagged with a pair of suture tape sutures for anticipated later repair.  Good elasticity was identified.  Rotator cuff was completely deficient superiorly.  The capsular attachments on the anterior and infra margins of the humeral neck were then divided in a subperiosteal fashion the humeral head was then delivered through the wound.  An extra medullary guide was then used to outline our proposed humeral head resection which was then performed with an oscillating saw at approximately 20 degrees retroversion.  A metal cap was placed over the cut proximal humeral surface and the glenoid was then exposed using appropriate retractors.  A circumferential labral resection was then completed gaining complete visualization of the glenoid.  A guidepin was then directed into the center of the glenoid with a slight approximately 5 degree inferior tilt.  The glenoid was then reamed with both the central and peripheral reamers to a stable subchondral bone bed.  Glenoid preparation was then completed and our baseplate was assembled with a 30 mm lag screw.  The baseplate was then inserted using standard technique with vancomycin powder on the lagged threads and good purchase and fixation was achieved.  All of the peripheral locking screws were then placed with good purchase and fixation.  A 39/+4 glenosphere was then impacted onto the  baseplate and the central locking screw was placed.  Attention then  returned back to the proximal humerus where the canal was opened hand reaming up to size 7 and ultimately broaching up to a size 9 at approximately 20 degrees retroversion.  A neutral metaphyseal reamer was then utilized to prepare the metaphysis.  A trial implant was then placed and this showed good motion good stability good soft tissue balance.  Trial was then removed and the final implant was assembled.  Vancomycin powder was then spread liberally into the humeral canal and good purchase and fixation of the implant was achieved upon implantation.  A series of trial reduction was then performed with felt that ultimately the +6 polyethylene insert gave [place] the best soft tissue balance with good motion and good stability.  The final +6 polywas then impacted after the implant was cleaned and dried.  Final reduction again showed good motion good stability good soft tissue balance.  The wound was then copiously irrigated.  Hemostasis was obtained.  The subscapularis was then repaired back to the eyelets on the collar of our implant.  Final irrigation completed.  Hemostasis was obtained.  The deltopectoral interval was reapproximated with a series of figure-of-eight #1 Vicryl sutures.  2-0 Monocryl used to the subcu layer and intracuticular 3-0 Monocryl for the skin followed by Dermabond and Aquacel dressing.  Left arm was then placed into a sling.  Patient was then awakened, extubated, and taken to the recovery room in stable condition.  Jenetta Loges, PA-C was utilized as an Environmental consultant throughout this case, essential for help with positioning the patient, positioning extremity, tissue manipulation, implantation of the prosthesis, suture management, wound closure, and intraoperative decision-making.  Marin Shutter MD   Contact # [phone]

## 2020-06-10 NOTE — Discharge Instructions (Signed)
 Kevin M. Supple, M.D., F.A.A.O.S. Orthopaedic Surgery Specializing in Arthroscopic and Reconstructive Surgery of the Shoulder [phone] [address] - [place], [place] [address] - Fax [phone]   POST-OP TOTAL SHOULDER REPLACEMENT INSTRUCTIONS  1. Follow up in the office for your first post-op appointment 10-14 days from the date of your surgery. If you do not already have a scheduled appointment, our office will contact you to schedule.  2. The bandage over your incision is waterproof. You may begin showering with this dressing on. You may leave this dressing on until first follow up appointment within 2 weeks. We prefer you leave this dressing in place until follow up however after 5-7 days if you are having itching or skin irritation and would like to remove it you may do so. Go slow and tug at the borders gently to break the bond the dressing has with the skin. At this point if there is no drainage it is okay to go without a bandage or you may cover it with a light guaze and tape. You can also expect significant bruising around your shoulder that will drift down your arm and into your chest wall. This is very normal and should resolve over several days.   3. Wear your sling/immobilizer at all times except to perform the exercises below or to occasionally let your arm dangle by your side to stretch your elbow. You also need to sleep in your sling immobilizer until instructed otherwise. It is ok to remove your sling if you are sitting in a controlled environment and allow your arm to rest in a position of comfort by your side or on your lap with pillows to give your neck and skin a break from the sling. You may remove it to allow arm to dangle by side to shower. If you are up walking around and when you go to sleep at night you need to wear it.  4. Range of motion to your elbow, wrist, and hand are encouraged 3-5 times daily. Exercise to your hand and fingers helps to reduce  swelling you may experience.   5. Prescriptions for a pain medication and a muscle relaxant are provided for you. It is recommended that if you are experiencing pain that you pain medication alone is not controlling, add the muscle relaxant along with the pain medication which can give additional pain relief. The first 1-2 days is generally the most severe of your pain and then should gradually decrease. As your pain lessens it is recommended that you decrease your use of the pain medications to an "as needed basis'" only and to always comply with the recommended dosages of the pain medications.  6. Pain medications can produce constipation along with their use. If you experience this, the use of an over the counter stool softener or laxative daily is recommended.   7. For additional questions or concerns, please do not hesitate to call the office. If after hours there is an answering service to forward your concerns to the physician on call.  8.Pain control following an exparel block  To help control your post-operative pain you received a nerve block  performed with Exparel which is a long acting anesthetic (numbing agent) which can provide pain relief and sensations of numbness (and relief of pain) in the operative shoulder and arm for up to 3 days. Sometimes it provides mixed relief, meaning you may still have numbness in certain areas of the arm but can still be able to   move  parts of that arm, hand, and fingers. We recommend that your prescribed pain medications  be used as needed. We do not feel it is necessary to "pre medicate" and "stay ahead" of pain.  Taking narcotic pain medications when you are not having any pain can lead to unnecessary and potentially dangerous side effects.    9. Use the ice machine as much as possible in the first 5-7 days from surgery, then you can wean its use to as needed. The ice typically needs to be replaced every 6 hours, instead of ice you can actually freeze  water bottles to put in the cooler and then fill water around them to avoid having to purchase ice. You can have spare water bottles freezing to allow you to rotate them once they have melted. Try to have a thin shirt or light cloth or towel under the ice wrap to protect your skin.   FOR ADDITIONAL INFO ON ICE MACHINE AND INSTRUCTIONS GO TO THE WEBSITE AT  https://www.[employer].com/products/donjoy/donjoy-iceman-[email or web address]  10.  We recommend that you avoid any dental work or cleaning in the first 3 months following your joint replacement. This is to help minimize the possibility of infection from the bacteria in your mouth that enters your bloodstream during dental work. We also recommend that you take an antibiotic prior to your dental work for the first year after your shoulder replacement to further help reduce that risk. Please simply contact our office for antibiotics to be sent to your pharmacy prior to dental work.  11. Dental Antibiotics:  In most cases prophylactic antibiotics for Dental procdeures after total joint surgery are not necessary.  Exceptions are as follows:  1. History of prior total joint infection  2. Severely immunocompromised (Organ Transplant, cancer chemotherapy, Rheumatoid biologic meds such as Humera)  3. Poorly controlled diabetes (A1C &gt; 8.0, blood glucose over 200)  If you have one of these conditions, contact your surgeon for an antibiotic prescription, prior to your dental procedure.   POST-OP EXERCISES  Pendulum Exercises  Perform pendulum exercises while standing and bending at the waist. Support your uninvolved arm on a table or chair and allow your operated arm to hang freely. Make sure to do these exercises passively - not using you shoulder muscles. These exercises can be performed once your nerve block effects have worn off.  Repeat 20 times. Do 3 sessions per day.     

## 2020-06-11 ENCOUNTER — Encounter (HOSPITAL_COMMUNITY): Payer: Self-pay | Admitting: Orthopedic Surgery

## 2020-06-15 NOTE — Progress Notes (Signed)
Chief Complaint:   OBESITY Travis Huff is here to discuss his progress with his obesity treatment plan along with follow-up of his obesity related diagnoses.   Today's visit was #: 8 Starting weight: 350 lbs Starting date: 02/03/2020 Today's weight: 288 lbs Today's date: 06/03/2020 Weight change since last visit: 11 lbs Total lbs lost to date: 62 lbs Body mass index is 40.17 kg/m.  Total weight loss percentage to date: -17.71%  Interim History: Squire has decreased his beer intake.  Only having snack calories in beer per day or less.  Being stricter with his snack calories.  No hunger.  No cravings.  Drank a lot more water over the last couple weeks as well.  He has shoulder replacement surgery scheduled on the 9th.  Current Meal Plan: the Category 4 Plan for 95% of the time.  Current Exercise Plan: Swimming for 2-3 hours 2 times per week.  Assessment/Plan:   Medications Discontinued During This Encounter  Medication Reason   Vitamin D, Ergocalciferol, (DRISDOL) 1.25 MG (50000 UNIT) CAPS capsule Reorder   Meds ordered this encounter  Medications   Vitamin D, Ergocalciferol, (DRISDOL) 1.25 MG (50000 UNIT) CAPS capsule    Sig: TAKE 1 CAPSULE (50,000 UNITS TOTAL) BY MOUTH EVERY 7 (SEVEN) DAYS.    Dispense:  4 capsule    Refill:  0    Ov for rf   1. Vitamin D deficiency Not at goal. Current vitamin D is 26.1, tested on 02/03/2020. Optimal goal > 50 ng/dL.  He is taking vitamin D 50,000 IU weekly.  Plan: Continue to take prescription Vitamin D @50 ,000 IU every week as prescribed.  Follow-up for routine testing of Vitamin D, at least 2-3 times per year to avoid over-replacement.  - Refill Vitamin D, Ergocalciferol, (DRISDOL) 1.25 MG (50000 UNIT) CAPS capsule; TAKE 1 CAPSULE (50,000 UNITS TOTAL) BY MOUTH EVERY 7 (SEVEN) DAYS.  Dispense: 4 capsule; Refill: 0  2. Prediabetes Not at goal. Goal is HgbA1c < 5.7.  Medication: None.  A1c has gone from 6.0 4 months ago to 5.8 recently.    Plan:  He will continue to focus on protein-rich, low simple carbohydrate foods. We reviewed the importance of hydration, regular exercise for stress reduction, and restorative sleep.   Lab Results  Component Value Date   HGBA1C 5.8 (H) 05/28/2020   Lab Results  Component Value Date   INSULIN 17.2 02/03/2020   3. At risk for dehydration Filimon is at higher than average risk of dehydration due to profusely sweating and getting in 60 ounces of water per day.  Talan was given more than 9 minutes of proper hydration counseling today.  We discussed the signs and symptoms of dehydration, some of which may include muscle cramping, constipation or even orthostatic symptoms.  Counseling on the prevention of dehydration was also provided today.  Ember is at risk for dehydration due to weight loss, lifestyle and behavorial habits and possibly due to taking certain medication(s).  He was encouraged to adequately hydrate and monitor fluid status to avoid dehydration as well as weight loss plateaus.  Unless pre-existing renal or cardiopulmonary conditions exist, in which patient was told to limit their fluid intake, I recommended roughly one half of their weight in pounds to be the approximate ounces of non-caloric, non-caffeinated beverages they should drink per day; including more if they are engaging in exercise.  4. Obesity, current BMI 40.3  Course: Kristoffer is currently in the action stage of change. As such, his  goal is to continue with weight loss efforts.   Nutrition goals: He has agreed to the Category 4 Plan.   Exercise goals:  As is.  Behavioral modification strategies: increasing water intake, meal planning and cooking strategies, avoiding temptations, and planning for success.  Andris has agreed to follow-up with our clinic in 2-3 weeks. He was informed of the importance of frequent follow-up visits to maximize his success with intensive lifestyle modifications for his multiple health  conditions.   Objective:   Blood pressure 135/89, pulse 72, temperature 98.2 F (36.8 C), height 5\' 11"  (1.803 m), weight 288 lb (130.6 kg), SpO2 93 %. Body mass index is 40.17 kg/m.  General: Cooperative, alert, well developed, in no acute distress. HEENT: Conjunctivae and lids unremarkable. Cardiovascular: Regular rhythm.  Lungs: Normal work of breathing. Neurologic: No focal deficits.   Lab Results  Component Value Date   CREATININE 0.94 05/28/2020   BUN 20 05/28/2020   NA 137 05/28/2020   K 4.4 05/28/2020   CL 102 05/28/2020   CO2 25 05/28/2020   Lab Results  Component Value Date   ALT 34 02/03/2020   AST 39 02/03/2020   ALKPHOS 149 (H) 02/03/2020   BILITOT 0.3 02/03/2020   Lab Results  Component Value Date   HGBA1C 5.8 (H) 05/28/2020   HGBA1C 6.0 (H) 02/03/2020   HGBA1C 5.6 06/19/2018   HGBA1C 5.9 (H) 04/17/2017   HGBA1C  12/14/2009    5.5 (NOTE)                                                                       According to the ADA Clinical Practice Recommendations for 2011, when HbA1c is used as a screening test:   >=6.5%   Diagnostic of Diabetes Mellitus           (if abnormal result  is confirmed)  5.7-6.4%   Increased risk of developing Diabetes Mellitus  References:Diagnosis and Classification of Diabetes Mellitus,Diabetes YHCW,2376,28(BTDVV 1):S62-S69 and Standards of Medical Care in         Diabetes - 2011,Diabetes Care,2011,34  (Suppl 1):S11-S61.   Lab Results  Component Value Date   INSULIN 17.2 02/03/2020   Lab Results  Component Value Date   TSH 2.110 02/03/2020   Lab Results  Component Value Date   CHOL 170 02/03/2020   HDL 73 02/03/2020   LDLCALC 82 02/03/2020   TRIG 83 02/03/2020   CHOLHDL 2.3 02/03/2020   Lab Results  Component Value Date   WBC 7.7 05/28/2020   HGB 16.1 05/28/2020   HCT 48.3 05/28/2020   MCV 94.0 05/28/2020   PLT 184 05/28/2020   Attestation Statements:   Reviewed by clinician on day of visit: allergies,  medications, problem list, medical history, surgical history, family history, social history, and previous encounter notes.  I, Water quality scientist, CMA, am acting as Location manager for [employer], DO.  I have reviewed the above documentation for accuracy and completeness, and I agree with the above. Marjory Sneddon, D.O.  The [place] was signed into law in 2016 which includes the topic of electronic health records.  This provides immediate access to information in MyChart.  This includes consultation notes, operative notes, office notes, lab  results and pathology reports.  If you have any questions about what you read please let us know at your next visit so we can discuss your concerns and take corrective action if need be.  We are right here with you.

## 2020-06-23 DIAGNOSIS — Z96619 Presence of unspecified artificial shoulder joint: Secondary | ICD-10-CM | POA: Diagnosis not present

## 2020-06-23 DIAGNOSIS — Z471 Aftercare following joint replacement surgery: Secondary | ICD-10-CM | POA: Diagnosis not present

## 2020-06-23 DIAGNOSIS — Z96611 Presence of right artificial shoulder joint: Secondary | ICD-10-CM | POA: Diagnosis not present

## 2020-06-23 DIAGNOSIS — Z96612 Presence of left artificial shoulder joint: Secondary | ICD-10-CM | POA: Diagnosis not present

## 2020-06-24 ENCOUNTER — Other Ambulatory Visit: Payer: Self-pay

## 2020-06-24 ENCOUNTER — Encounter (INDEPENDENT_AMBULATORY_CARE_PROVIDER_SITE_OTHER): Payer: Self-pay | Admitting: Family Medicine

## 2020-06-24 ENCOUNTER — Other Ambulatory Visit (HOSPITAL_BASED_OUTPATIENT_CLINIC_OR_DEPARTMENT_OTHER): Payer: Self-pay

## 2020-06-24 ENCOUNTER — Ambulatory Visit (INDEPENDENT_AMBULATORY_CARE_PROVIDER_SITE_OTHER): Payer: 59 | Admitting: Family Medicine

## 2020-06-24 VITALS — BP 130/77 | HR 89 | Temp 98.5°F | Ht 71.0 in | Wt 278.0 lb

## 2020-06-24 DIAGNOSIS — Z9189 Other specified personal risk factors, not elsewhere classified: Secondary | ICD-10-CM | POA: Diagnosis not present

## 2020-06-24 DIAGNOSIS — Z6841 Body Mass Index (BMI) 40.0 and over, adult: Secondary | ICD-10-CM | POA: Diagnosis not present

## 2020-06-24 DIAGNOSIS — E559 Vitamin D deficiency, unspecified: Secondary | ICD-10-CM | POA: Diagnosis not present

## 2020-06-24 MED ORDER — VITAMIN D (ERGOCALCIFEROL) 1.25 MG (50000 UNIT) PO CAPS
ORAL_CAPSULE | ORAL | 0 refills | Status: DC
  Filled 2020-06-24: qty 4, 28d supply, fill #0

## 2020-06-24 MED FILL — Dronedarone HCl Tab 400 MG (Base Equivalent): ORAL | 90 days supply | Qty: 180 | Fill #0 | Status: AC

## 2020-06-25 ENCOUNTER — Other Ambulatory Visit (HOSPITAL_BASED_OUTPATIENT_CLINIC_OR_DEPARTMENT_OTHER): Payer: Self-pay

## 2020-06-28 ENCOUNTER — Other Ambulatory Visit (HOSPITAL_BASED_OUTPATIENT_CLINIC_OR_DEPARTMENT_OTHER): Payer: Self-pay

## 2020-06-30 NOTE — Telephone Encounter (Signed)
Discussed w/ pt. Pt scheduled to see Camnitz in the [place] office 7/26 to further discuss. Patient verbalized understanding and agreeable to plan.

## 2020-07-01 ENCOUNTER — Ambulatory Visit (HOSPITAL_COMMUNITY): Payer: 59 | Admitting: Physician Assistant

## 2020-07-01 NOTE — Progress Notes (Signed)
Chief Complaint:   OBESITY Travis Huff is here to discuss his progress with his obesity treatment plan along with follow-up of his obesity related diagnoses.   Today's visit was #: 9 Starting weight: 350 lbs Starting date: 02/03/2020 Today's weight: 278 lbs Today's date: 06/24/2020 Weight change since last visit: 10 lbs Total lbs lost to date: 72 lbs Body mass index is 38.77 kg/m.  Total weight loss percentage to date: -20.57%  Interim History:  Travis Huff never skips meals.  Even if he eats lunch at 5 pm, he will eat dinner at 8 am.  Snacks on [place], string cheese, 2 beers (94 calories).  Current Meal Plan: the Category 4 Plan for 95% of the time.  Current Exercise Plan: None.  Assessment/Plan:   Medications Discontinued During This Encounter  Medication Reason   Vitamin D, Ergocalciferol, (DRISDOL) 1.25 MG (50000 UNIT) CAPS capsule Reorder   Meds ordered this encounter  Medications   Vitamin D, Ergocalciferol, (DRISDOL) 1.25 MG (50000 UNIT) CAPS capsule    Sig: TAKE 1 CAPSULE (50,000 UNITS TOTAL) BY MOUTH EVERY 7 (SEVEN) DAYS.    Dispense:  4 capsule    Refill:  0    Ov for rf    1. Vitamin D deficiency Not at goal.  He is taking vitamin D 50,000 IU weekly.  Plan: Continue to take prescription Vitamin D @50 ,000 IU every week as prescribed.  Follow-up for routine testing of Vitamin D, at least 2-3 times per year to avoid over-replacement.  Lab Results  Component Value Date   VD25OH 26.1 (L) 02/03/2020   - Refill Vitamin D, Ergocalciferol, (DRISDOL) 1.25 MG (50000 UNIT) CAPS capsule; TAKE 1 CAPSULE (50,000 UNITS TOTAL) BY MOUTH EVERY 7 (SEVEN) DAYS.  Dispense: 4 capsule; Refill: 0  2. At risk for impaired metabolic function Due to Travis Huff's current state of health and medical condition(s), he is at a significantly higher risk for impaired metabolic function.   At least 9 minutes was spent on counseling Travis Huff about these concerns today.  This places the patient at a much greater  risk to subsequently develop cardio-pulmonary conditions that can negatively affect the patient's quality of life.  I stressed the importance of reversing these risks factors.  The initial goal is to lose at least 5-10% of starting weight to help reduce risk factors.  Counseling:  Intensive lifestyle modifications discussed with Travis Huff as the most appropriate first line treatment.  he will continue to work on diet, exercise, and weight loss efforts.  We will continue to reassess these conditions on a fairly regular basis in an attempt to decrease the patient's overall morbidity and mortality.  3. Obesity, current BMI 38.7  Course: Travis Huff is currently in the action stage of change. As such, his goal is to continue with weight loss efforts.   Nutrition goals: He has agreed to the Category 4 Plan.   Exercise goals:  As is.  Behavioral modification strategies: increasing lean protein intake, decreasing simple carbohydrates, no skipping meals, and planning for success.  Travis Huff has agreed to follow-up with our clinic in 2-3 weeks. He was informed of the importance of frequent follow-up visits to maximize his success with intensive lifestyle modifications for his multiple health conditions.   Objective:   Blood pressure 130/77, pulse 89, temperature 98.5 F (36.9 C), height 5\' 11"  (1.803 m), weight 278 lb (126.1 kg), SpO2 97 %. Body mass index is 38.77 kg/m.  General: Cooperative, alert, well developed, in no acute distress. HEENT: Conjunctivae  and lids unremarkable. Cardiovascular: Regular rhythm.  Lungs: Normal work of breathing. Neurologic: No focal deficits.   Lab Results  Component Value Date   CREATININE 0.94 05/28/2020   BUN 20 05/28/2020   NA 137 05/28/2020   K 4.4 05/28/2020   CL 102 05/28/2020   CO2 25 05/28/2020   Lab Results  Component Value Date   ALT 34 02/03/2020   AST 39 02/03/2020   ALKPHOS 149 (H) 02/03/2020   BILITOT 0.3 02/03/2020   Lab Results  Component Value  Date   HGBA1C 5.8 (H) 05/28/2020   HGBA1C 6.0 (H) 02/03/2020   HGBA1C 5.6 06/19/2018   HGBA1C 5.9 (H) 04/17/2017   HGBA1C  12/14/2009    5.5 (NOTE)                                                                       According to the ADA Clinical Practice Recommendations for 2011, when HbA1c is used as a screening test:   >=6.5%   Diagnostic of Diabetes Mellitus           (if abnormal result  is confirmed)  5.7-6.4%   Increased risk of developing Diabetes Mellitus  References:Diagnosis and Classification of Diabetes Mellitus,Diabetes ZOXW,9604,54(UJWJX 1):S62-S69 and Standards of Medical Care in         Diabetes - 2011,Diabetes Care,2011,34  (Suppl 1):S11-S61.   Lab Results  Component Value Date   INSULIN 17.2 02/03/2020   Lab Results  Component Value Date   TSH 2.110 02/03/2020   Lab Results  Component Value Date   CHOL 170 02/03/2020   HDL 73 02/03/2020   LDLCALC 82 02/03/2020   TRIG 83 02/03/2020   CHOLHDL 2.3 02/03/2020   Lab Results  Component Value Date   VD25OH 26.1 (L) 02/03/2020   Lab Results  Component Value Date   WBC 7.7 05/28/2020   HGB 16.1 05/28/2020   HCT 48.3 05/28/2020   MCV 94.0 05/28/2020   PLT 184 05/28/2020   Attestation Statements:   Reviewed by clinician on day of visit: allergies, medications, problem list, medical history, surgical history, family history, social history, and previous encounter notes.  I, Water quality scientist, CMA, am acting as Location manager for [employer], DO.  I have reviewed the above documentation for accuracy and completeness, and I agree with the above. Marjory Sneddon, D.O.  The [place] was signed into law in 2016 which includes the topic of electronic health records.  This provides immediate access to information in MyChart.  This includes consultation notes, operative notes, office notes, lab results and pathology reports.  If you have any questions about what you read please let us know at your  next visit so we can discuss your concerns and take corrective action if need be.  We are right here with you.

## 2020-07-06 ENCOUNTER — Ambulatory Visit (INDEPENDENT_AMBULATORY_CARE_PROVIDER_SITE_OTHER): Payer: 59 | Admitting: Rehabilitative and Restorative Service Providers"

## 2020-07-06 ENCOUNTER — Encounter: Payer: Self-pay | Admitting: Rehabilitative and Restorative Service Providers"

## 2020-07-06 ENCOUNTER — Other Ambulatory Visit: Payer: Self-pay

## 2020-07-06 DIAGNOSIS — M6281 Muscle weakness (generalized): Secondary | ICD-10-CM

## 2020-07-06 DIAGNOSIS — M25611 Stiffness of right shoulder, not elsewhere classified: Secondary | ICD-10-CM | POA: Diagnosis not present

## 2020-07-06 DIAGNOSIS — M25512 Pain in left shoulder: Secondary | ICD-10-CM | POA: Diagnosis not present

## 2020-07-06 DIAGNOSIS — R601 Generalized edema: Secondary | ICD-10-CM | POA: Diagnosis not present

## 2020-07-06 DIAGNOSIS — R29898 Other symptoms and signs involving the musculoskeletal system: Secondary | ICD-10-CM

## 2020-07-06 DIAGNOSIS — G8929 Other chronic pain: Secondary | ICD-10-CM | POA: Diagnosis not present

## 2020-07-06 NOTE — Patient Instructions (Signed)
Access Code: [email or web address]: [email or web address]: 07/05/2022Prepared by: Aki Abalos HoltExercises  Seated Shoulder Flexion Towel Slide at Table Top Full Range of Motion - 2 x daily - 7 x weekly - 1 sets - 5-10 reps - 10sec hold  Seated Shoulder Scaption Slide at Table Top with Forearm in Neutral - 2 x daily - 7 x weekly - 1 sets - 5-10 reps - 10 sec hold  Seated Shoulder External Rotation AAROM with Cane and Hand in Neutral - 2 x daily - 7 x weekly - 1 sets - 5-10 reps - 5-10 sec hold

## 2020-07-06 NOTE — Therapy (Signed)
[place], [place], [address] Phone: [phone]   Fax:  [phone]  Physical Therapy Evaluation  Patient Details  Name: Travis Huff MRN: [id number] Date of Birth: 12-22-1960 Referring Provider (PT): Dr Justice Britain   Encounter Date: 07/06/2020   PT End of Session - 07/06/20 1054     Visit Number 1    Number of Visits 24    Date for PT Re-Evaluation 09/28/20    PT Start Time 3875    PT Stop Time 1104    PT Time Calculation (min) 49 min    Activity Tolerance Patient tolerated treatment well             Past Medical History:  Diagnosis Date   Arrhythmia    Arthritis    shoulders, knees,  back,hips   Atrial fibrillation (HCC)    Avascular necrosis of bone of left hip (HCC)    Back pain    Balance problem    Blood clot in vein 06/2016   x 2 no bleeding disorders found after back surgery   Complication of anesthesia    ketamine hallucinations   Constipation    Coronary artery disease    Dislocated hip, left, initial encounter ([place]) 10/03/2018   Dysrhythmia 2020   a-fib   Hx of blood clots    Hypertension    Joint pain    Neuromuscular disorder (HCC)    neuropathy bi lat legs knee down   Neuropathy    PAF (paroxysmal atrial fibrillation) (HCC)    Pre-diabetes    Prediabetes    Shakes    SOBOE (shortness of breath on exertion)    Swelling of both lower extremities    Viral pericarditis 20 yrs ago    Past Surgical History:  Procedure Laterality Date   ABDOMINAL EXPOSURE N/A 06/23/2016   Procedure: ABDOMINAL EXPOSURE;  Surgeon: Rosetta Posner, MD;  Location: [facility];  Service: Vascular;  Laterality: N/A;   ANTERIOR LATERAL LUMBAR FUSION 4 LEVELS Right 06/23/2016   Procedure: Right  Lumbar two-three Lumbar three-four Lumbar four-five Anterolateral lumbar interbody fusion;  Surgeon: Erline Levine, MD;  Location: [facility];  Service: Neurosurgery;  Laterality: Right;   ANTERIOR LUMBAR  FUSION N/A 06/23/2016   Procedure: Lumbar five -sacral one  Anterior lumbar interbody fusion with Dr. Sherren Mocha Early for approach;  Surgeon: Erline Levine, MD;  Location: [facility];  Service: Neurosurgery;  Laterality: N/A;   APPLICATION OF INTRAOPERATIVE CT SCAN N/A 06/26/2016   Procedure: APPLICATION OF INTRAOPERATIVE CT SCAN;  Surgeon: Erline Levine, MD;  Location: [facility];  Service: Neurosurgery;  Laterality: N/A;   BACK SURGERY  2018   CARDIAC CATHETERIZATION     CARDIOVASCULAR STRESS TEST     Negative in May 2014   COLONOSCOPY     HERNIA REPAIR     umbilical   HIP CLOSED REDUCTION Left 10/03/2018   Procedure: CLOSED REDUCTION HIP;  Surgeon: Rod Can, MD;  Location: [facility];  Service: Orthopedics;  Laterality: Left;   IR IVC FILTER PLMT / S&I /IMG GUID/MOD SED  03/26/2018   IR RADIOLOGIST EVAL & MGMT  04/16/2019   JOINT REPLACEMENT     LEFT HEART CATH AND CORONARY ANGIOGRAPHY N/A 06/21/2018   Procedure: LEFT HEART CATH AND CORONARY ANGIOGRAPHY;  Surgeon: Jettie Booze, MD;  Location: [facility];  Service: Cardiovascular;  Laterality: N/A;   POSTERIOR LUMBAR FUSION 4 LEVEL N/A 06/26/2016   Procedure: Thoracic ten  to Pelvis fixation with AIRO;  Surgeon: Erline Levine, MD;  Location: [facility];  Service: Neurosurgery;  Laterality: N/A;   REVERSE SHOULDER ARTHROPLASTY Right 09/18/2019   Procedure: REVERSE SHOULDER ARTHROPLASTY;  Surgeon: Justice Britain, MD;  Location: [facility];  Service: Orthopedics;  Laterality: Right;  112min   REVERSE SHOULDER ARTHROPLASTY Left 06/10/2020   Procedure: REVERSE SHOULDER ARTHROPLASTY;  Surgeon: Justice Britain, MD;  Location: [facility];  Service: Orthopedics;  Laterality: Left;  132min   TENDON REPAIR     right hand   TOTAL HIP ARTHROPLASTY Right 05/25/2014   Procedure: RIGHT TOTAL HIP ARTHROPLASTY ANTERIOR APPROACH;  Surgeon: Rod Can, MD;  Location: [facility];  Service: Orthopedics;  Laterality: Right;   TOTAL HIP ARTHROPLASTY Left 03/27/2018   Procedure: TOTAL  HIP ARTHROPLASTY ANTERIOR APPROACH- DA complex;  Surgeon: Rod Can, MD;  Location[facility];  Service: Orthopedics;  Laterality: Left;   TOTAL KNEE ARTHROPLASTY Bilateral     There were no vitals filed for this visit.    Subjective Assessment - 07/06/20 1018     Subjective Patient underwent Lt reverse TSA 09/03/09 with no complications. He underwent Rt reverse TSA 9/21 with no problems. He did have a fall last week striking the Lt arm - no problems since then.    Patient is accompained by: Family member    Pertinent History a-fib; Rt reverse TSA ~9/21 yrs; neuropathy bilat LE's; lumbar fusion 6/18 with supsequent nerve injury; peripherial neuropathy: Lt THA 3/20; Rt THA 5/16; heart cath 2020    Patient Stated Goals get shoulder working again - full range and better strength    Currently in Pain? No/denies    Pain Score 0-No pain    Pain Location Shoulder    Pain Orientation Left    Aggravating Factors  raising Lt arm overhead or reaching too far; reaching down to pick something up                [facility] PT Assessment - 07/06/20 0001       Assessment   Medical Diagnosis Lt Reverse TSA    Referring Provider (PT) Dr Justice Britain    Onset Date/Surgical Date 06/10/20    Hand Dominance Right    Next MD Visit 3 weeks    Prior Therapy here for Rt reverse TSA      Precautions   Precautions Shoulder      Balance Screen   Has the patient fallen in the past 6 months Yes    How many times? 1    Has the patient had a decrease in activity level because of a fear of falling?  No    Is the patient reluctant to leave their home because of a fear of falling?  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    Leisure computer; dog; yard work; household activites       Observation/Other Assessments   Focus on Therapeutic Outcomes (FOTO)  33      Observation/Other Assessments-Edema    Edema --   generalized edema Lt shoulder     Sensation   Additional Comments WFL's per pt bilat UE's      Posture/Postural Control   Posture Comments head forward; shoudlers rounded and elevated; head  of the humerus anterior in orientation      AROM   Right Shoulder Flexion 152 Degrees    Right Shoulder ABduction 140 Degrees    Right Shoulder External Rotation 90 Degrees   shoulder 90 deg abd/elbow 90 deg flexion     PROM   Left Shoulder Flexion 90 Degrees    Left Shoulder ABduction 60 Degrees   in scapular plane   Left Shoulder External Rotation 30 Degrees   in scapular plane                       Objective measurements completed on examination: See above findings.       [facility] Adult PT Treatment/Exercise - 07/06/20 0001       Shoulder Exercises: Stretch   External Rotation Stretch 5 reps;10 seconds   with cane   Table Stretch - Flexion 5 reps;10 seconds    Table Stretch - Abduction 5 reps;10 seconds      Vasopneumatic   Number Minutes Vasopneumatic  15 minutes    Vasopnuematic Location  Shoulder    Vasopneumatic Pressure Low    Vasopneumatic Temperature  34                    PT Education - 07/06/20 1053     Education Details HEP    Person(s) Educated Patient    Methods Explanation;Demonstration;Tactile cues;Verbal cues;Handout    Comprehension Verbalized understanding;Returned demonstration;Verbal cues required;Tactile cues required              PT Short Term Goals - 07/06/20 1100       PT SHORT TERM GOAL #1   Title Independent in initial HEP    Time 6    Period Weeks    Status New    Target Date 08/17/20      PT SHORT TERM GOAL #2   Title Patient demonstrates and verbalizes post op precautions to protect TSA    Time 6    Period Weeks    Status New    Target Date 08/17/20      PT SHORT TERM GOAL #3    Title Patient demonstrates and verbalizes appropriate positions and postures to support Lt UE and facilitate healing    Time 6    Status New    Target Date 08/17/20      PT SHORT TERM GOAL #4   Title -               PT Long Term Goals - 07/06/20 1246       PT LONG TERM GOAL #1   Title Improve Lt shoulder ROM to allowed motion per MD protocol    Time 12    Period Weeks    Status New    Target Date 09/28/20      PT LONG TERM GOAL #2   Title Functional strength Rt UE to allow patient to reach overhead and use Lt UE for functional activities    Time 12    Period Weeks    Status New    Target Date 09/28/20      PT LONG TERM GOAL #3   Title Independent in HEP    Time 12    Period Weeks    Status New    Target Date 09/28/20      PT LONG TERM GOAL #4   Title Improve functional limitation score to 67    Time 12    Period Weeks  Status New    Target Date 09/28/20      PT LONG TERM GOAL #5   Title -                    Plan - 07/06/20 1055     Clinical Impression Statement Patient presents s/p Lt reverse TSA 06/10/20. He has no complications following surgery. Patient underwent Rt reverse TSA ~ 1 year ago and has done well following surgery. He had a lumbar surgery 4-5 years ago with subsequent bilat LE neuropathy. He ambulates with a cane - most comfortably using cane in the Lt UE. Discussed that it is safer at this time to use cane in the Rt UE. Patient has limited A/PROM Lt shoulder; decreased strength Lt UE; decreased ADL's and functional activities post op; poor posture and alignment. Patient will benefit from PT to address problems identified.    Stability/Clinical Decision Making Stable/Uncomplicated    Clinical Decision Making Low    Rehab Potential Good    PT Frequency 2x / week    PT Duration 12 weeks    PT Treatment/Interventions ADLs/Self Care Home Management;Aquatic Therapy;Cryotherapy;Electrical Stimulation;Iontophoresis 4mg /ml  Dexamethasone;Moist Heat;Ultrasound;Therapeutic activities;Therapeutic exercise;Neuromuscular re-education;Manual techniques;Patient/family education;Dry needling;Taping;Vasopneumatic Device    PT Next Visit Plan review and progress exercises per MD protocol; vaso for edema and pain management; manual work/PROM as tolerated    PT Home Exercise Plan [id number]    Consulted and Agree with Plan of Care Patient             Patient will benefit from skilled therapeutic intervention in order to improve the following deficits and impairments:  Decreased range of motion, Decreased activity tolerance, Pain, Impaired flexibility, Improper body mechanics, Decreased mobility, Decreased strength, Increased edema, Postural dysfunction  Visit Diagnosis: Chronic left shoulder pain  Other symptoms and signs involving the musculoskeletal system  Muscle weakness (generalized)  Stiffness of right shoulder, not elsewhere classified  Generalized edema     Problem List Patient Active Problem List   Diagnosis Date Noted   At risk for dehydration 06/03/2020   At risk for impaired metabolic function 68/11/5724   History of atrial fibrillation 03/23/2020   At risk for complication associated with hypotension 03/23/2020   Vitamin D deficiency 03/08/2020   At risk for diabetes mellitus 03/08/2020   Other fatigue 02/03/2020   SOBOE (shortness of breath on exertion) 02/03/2020   Essential hypertension 02/03/2020   Prediabetes 02/03/2020   Atrial fibrillation ([place]) 02/03/2020   Other hyperlipidemia 02/03/2020   Mood disorder ([place]), with emotional eating 02/03/2020   Closed fracture of fibula, lateral malleolus, left 01/13/2020   Colon polyp 01/13/2020   Secondary hypercoagulable state ([place]) 05/20/2019   Pseudogout of knee 01/27/2019   Benign paroxysmal positional vertigo 10/21/2018   Closed dislocation of left hip ([place]) 10/03/2018   Cough 09/16/2018   Paroxysmal atrial fibrillation ([place]) 07/04/2018    Spontaneous dissection of coronary artery 06/22/2018   Ischemic cardiomyopathy 06/22/2018   Near syncope 06/20/2018   Tachycardia 06/20/2018   Nausea 06/20/2018   PVC (premature ventricular contraction) 06/20/2018   History of DVT (deep vein thrombosis) 06/20/2018   NSTEMI (non-ST elevated myocardial infarction) ([place]) 06/20/2018   Onychomycosis 06/19/2018   Venous stasis dermatitis of both lower extremities 06/19/2018   Insomnia 06/19/2018   Osteoarthritis of right glenohumeral joint 06/17/2018   Rotator cuff arthropathy of left shoulder 06/17/2018   History of bilateral hip arthroplasty 03/27/2018   Avascular necrosis of bone of hip ([place]) 03/27/2018  Recurrent deep vein thrombosis (DVT) ([place]) 02/12/2018   Health counseling 02/12/2018   Hyperhomocystinemia ([place]) 01/10/2018   Cellulitis of right foot 01/08/2018   Intermittent claudication ([place]) 12/04/2017   Fissure in skin 12/04/2017   Acute deep vein thrombosis (DVT) of calf muscle vein of right lower extremity ([place]) 11/26/2017   Acute deep vein thrombosis of lower limb ([place]) 11/26/2017   Primary osteoarthritis of first carpometacarpal joint of left hand 11/21/2017   Hand cramps 10/25/2017   High frequency hearing loss, left 09/10/2017   Degeneration of thoracic intervertebral disc 05/30/2017   Mixed hyperlipidemia 04/18/2017   Increased creatine kinase level 04/18/2017   Scoliosis of thoracolumbar spine 06/23/2016   Anxiety, generalized 05/11/2016   Generalized anxiety disorder 05/11/2016   Benign essential hypertension 03/26/2014   Spinal stenosis, lumbar region, with neurogenic claudication 12/21/2013   Class 3 severe obesity with serious comorbidity and body mass index (BMI) of 45.0 to 49.9 in adult [facility]) 08/01/2012   History of bilateral knee arthroplasty with CPPD 01/26/2012   Erectile dysfunction 01/26/2012   Annual physical exam 01/26/2012   Impotence 01/26/2012    Cayson Kalb Nilda Simmer PT, MPH  07/06/2020, 12:51 PM  [facility] [place], [place], [address] Phone: [phone]   Fax:  [phone]  Name: Travis Huff MRN: [id number] Date of Birth: 1960/05/17

## 2020-07-07 ENCOUNTER — Other Ambulatory Visit (HOSPITAL_BASED_OUTPATIENT_CLINIC_OR_DEPARTMENT_OTHER): Payer: Self-pay

## 2020-07-09 ENCOUNTER — Ambulatory Visit: Payer: 59 | Admitting: Rehabilitative and Restorative Service Providers"

## 2020-07-09 ENCOUNTER — Encounter: Payer: Self-pay | Admitting: Rehabilitative and Restorative Service Providers"

## 2020-07-09 ENCOUNTER — Other Ambulatory Visit: Payer: Self-pay

## 2020-07-09 DIAGNOSIS — M25512 Pain in left shoulder: Secondary | ICD-10-CM

## 2020-07-09 DIAGNOSIS — R601 Generalized edema: Secondary | ICD-10-CM | POA: Diagnosis not present

## 2020-07-09 DIAGNOSIS — G8929 Other chronic pain: Secondary | ICD-10-CM | POA: Diagnosis not present

## 2020-07-09 DIAGNOSIS — R29898 Other symptoms and signs involving the musculoskeletal system: Secondary | ICD-10-CM | POA: Diagnosis not present

## 2020-07-09 DIAGNOSIS — M6281 Muscle weakness (generalized): Secondary | ICD-10-CM

## 2020-07-09 NOTE — Therapy (Signed)
[place], [place], [address] Phone: [phone]   Fax:  [phone]  Physical Therapy Treatment  Patient Details  Name: Travis Huff MRN: [id number] Date of Birth: 10-05-1960 Referring Provider (PT): Dr Justice Britain   Encounter Date: 07/09/2020   PT End of Session - 07/09/20 1020     Visit Number 2    Number of Visits 24    Date for PT Re-Evaluation 09/28/20    PT Start Time 1017    PT Stop Time 1100    PT Time Calculation (min) 43 min    Activity Tolerance Patient tolerated treatment well             Past Medical History:  Diagnosis Date   Arrhythmia    Arthritis    shoulders, knees,  back,hips   Atrial fibrillation ([place])    Avascular necrosis of bone of left hip (HCC)    Back pain    Balance problem    Blood clot in vein 06/2016   x 2 no bleeding disorders found after back surgery   Complication of anesthesia    ketamine hallucinations   Constipation    Coronary artery disease    Dislocated hip, left, initial encounter ([place]) 10/03/2018   Dysrhythmia 2020   a-fib   Hx of blood clots    Hypertension    Joint pain    Neuromuscular disorder (HCC)    neuropathy bi lat legs knee down   Neuropathy    PAF (paroxysmal atrial fibrillation) (HCC)    Pre-diabetes    Prediabetes    Shakes    SOBOE (shortness of breath on exertion)    Swelling of both lower extremities    Viral pericarditis 20 yrs ago    Past Surgical History:  Procedure Laterality Date   ABDOMINAL EXPOSURE N/A 06/23/2016   Procedure: ABDOMINAL EXPOSURE;  Surgeon: Rosetta Posner, MD;  Location: [facility];  Service: Vascular;  Laterality: N/A;   ANTERIOR LATERAL LUMBAR FUSION 4 LEVELS Right 06/23/2016   Procedure: Right  Lumbar two-three Lumbar three-four Lumbar four-five Anterolateral lumbar interbody fusion;  Surgeon: Erline Levine, MD;  Location: [facility];  Service: Neurosurgery;  Laterality: Right;   ANTERIOR LUMBAR  FUSION N/A 06/23/2016   Procedure: Lumbar five -sacral one  Anterior lumbar interbody fusion with Dr. Sherren Mocha Early for approach;  Surgeon: Erline Levine, MD;  Location: [facility];  Service: Neurosurgery;  Laterality: N/A;   APPLICATION OF INTRAOPERATIVE CT SCAN N/A 06/26/2016   Procedure: APPLICATION OF INTRAOPERATIVE CT SCAN;  Surgeon: Erline Levine, MD;  Location: [facility];  Service: Neurosurgery;  Laterality: N/A;   BACK SURGERY  2018   CARDIAC CATHETERIZATION     CARDIOVASCULAR STRESS TEST     Negative in May 2014   COLONOSCOPY     HERNIA REPAIR     umbilical   HIP CLOSED REDUCTION Left 10/03/2018   Procedure: CLOSED REDUCTION HIP;  Surgeon: Rod Can, MD;  Location: [facility];  Service: Orthopedics;  Laterality: Left;   IR IVC FILTER PLMT / S&I /IMG GUID/MOD SED  03/26/2018   IR RADIOLOGIST EVAL & MGMT  04/16/2019   JOINT REPLACEMENT     LEFT HEART CATH AND CORONARY ANGIOGRAPHY N/A 06/21/2018   Procedure: LEFT HEART CATH AND CORONARY ANGIOGRAPHY;  Surgeon: Jettie Booze, MD;  Location: [facility];  Service: Cardiovascular;  Laterality: N/A;   POSTERIOR LUMBAR FUSION 4 LEVEL N/A 06/26/2016   Procedure: Thoracic ten  to Pelvis fixation with AIRO;  Surgeon: Erline Levine, MD;  Location: [facility];  Service: Neurosurgery;  Laterality: N/A;   REVERSE SHOULDER ARTHROPLASTY Right 09/18/2019   Procedure: REVERSE SHOULDER ARTHROPLASTY;  Surgeon: Justice Britain, MD;  Location: [facility];  Service: Orthopedics;  Laterality: Right;  130min   REVERSE SHOULDER ARTHROPLASTY Left 06/10/2020   Procedure: REVERSE SHOULDER ARTHROPLASTY;  Surgeon: Justice Britain, MD;  Location: [facility];  Service: Orthopedics;  Laterality: Left;  110min   TENDON REPAIR     right hand   TOTAL HIP ARTHROPLASTY Right 05/25/2014   Procedure: RIGHT TOTAL HIP ARTHROPLASTY ANTERIOR APPROACH;  Surgeon: Rod Can, MD;  Location: [facility];  Service: Orthopedics;  Laterality: Right;   TOTAL HIP ARTHROPLASTY Left 03/27/2018   Procedure: TOTAL  HIP ARTHROPLASTY ANTERIOR APPROACH- DA complex;  Surgeon: Rod Can, MD;  Location[facility];  Service: Orthopedics;  Laterality: Left;   TOTAL KNEE ARTHROPLASTY Bilateral     There were no vitals filed for this visit.   Subjective Assessment - 07/09/20 1021     Subjective No problems with shoulder. Working on exercises.    Currently in Pain? No/denies    Pain Score 0-No pain                [facility] PT Assessment - 07/09/20 0001       Assessment   Medical Diagnosis Lt Reverse TSA    Referring Provider (PT) Dr Justice Britain    Onset Date/Surgical Date 06/10/20    Hand Dominance Right    Next MD Visit 3 weeks    Prior Therapy here for Rt reverse TSA      Precautions   Precautions Shoulder      PROM   Left Shoulder Flexion 110 Degrees    Left Shoulder ABduction 70 Degrees   in scapular plane   Left Shoulder External Rotation 40 Degrees   in scapular plane                          [facility] Adult PT Treatment/Exercise - 07/09/20 0001       Shoulder Exercises: Seated   External Rotation AROM;Both;20 reps   limited to ~ 35-40 deg Lt   Flexion AROM;Both;20 reps    Abduction AROM;Both;20 reps   in scapular plane   ABduction Limitations in scpaular plane      Shoulder Exercises: Pulleys   Flexion 2 minutes    Scaption 2 minutes    Other Pulley Exercises horizontal ab/add with UE's ~ 80 deg abd x ~ 10 reps      Shoulder Exercises: Isometric Strengthening   Extension 5X5"    External Rotation 5X5"    ABduction 5X5"      Vasopneumatic   Number Minutes Vasopneumatic  15 minutes    Vasopnuematic Location  Shoulder    Vasopneumatic Pressure Medium    Vasopneumatic Temperature  34      Manual Therapy   Manual therapy comments pt sitting    Passive ROM PROM Lt shoulder into flexion; scaptioin; ER in scapular plane                    PT Education - 07/09/20 1040     Education Details HEP    Person(s) Educated Patient    Methods  Explanation;Demonstration;Tactile cues;Verbal cues;Handout    Comprehension Verbalized understanding;Returned demonstration;Verbal cues required;Tactile cues required  PT Short Term Goals - 07/06/20 1100       PT SHORT TERM GOAL #1   Title Independent in initial HEP    Time 6    Period Weeks    Status New    Target Date 08/17/20      PT SHORT TERM GOAL #2   Title Patient demonstrates and verbalizes post op precautions to protect TSA    Time 6    Period Weeks    Status New    Target Date 08/17/20      PT SHORT TERM GOAL #3   Title Patient demonstrates and verbalizes appropriate positions and postures to support Lt UE and facilitate healing    Time 6    Status New    Target Date 08/17/20      PT SHORT TERM GOAL #4   Title -               PT Long Term Goals - 07/06/20 1246       PT LONG TERM GOAL #1   Title Improve Lt shoulder ROM to allowed motion per MD protocol    Time 12    Period Weeks    Status New    Target Date 09/28/20      PT LONG TERM GOAL #2   Title Functional strength Rt UE to allow patient to reach overhead and use Lt UE for functional activities    Time 12    Period Weeks    Status New    Target Date 09/28/20      PT LONG TERM GOAL #3   Title Independent in HEP    Time 12    Period Weeks    Status New    Target Date 09/28/20      PT LONG TERM GOAL #4   Title Improve functional limitation score to 67    Time 12    Period Weeks    Status New    Target Date 09/28/20      PT LONG TERM GOAL #5   Title -                   Plan - 07/09/20 1025     Clinical Impression Statement Patient reports no pain Lt shoulder. He has been working on his exercises at home. Added active exercises today without difficulty. Good active and passive ROM.    Rehab Potential Good    PT Frequency 2x / week    PT Duration 6 weeks    PT Treatment/Interventions ADLs/Self Care Home Management;Aquatic Therapy;Cryotherapy;Electrical  Stimulation;Iontophoresis 4mg /ml Dexamethasone;Moist Heat;Ultrasound;Therapeutic activities;Therapeutic exercise;Neuromuscular re-education;Manual techniques;Patient/family education;Dry needling;Taping;Vasopneumatic Device    PT Next Visit Plan review and progress exercises per MD protocol; vaso for edema and pain management; manual work/PROM as tolerated    PT Home Exercise Plan [email or web address]    Consulted and Agree with Plan of Care Patient             Patient will benefit from skilled therapeutic intervention in order to improve the following deficits and impairments:     Visit Diagnosis: Chronic left shoulder pain  Other symptoms and signs involving the musculoskeletal system  Muscle weakness (generalized)  Generalized edema     Problem List Patient Active Problem List   Diagnosis Date Noted   At risk for dehydration 06/03/2020   At risk for impaired metabolic function 09/38/1829   History of atrial fibrillation 03/23/2020   At risk for complication associated with hypotension 03/23/2020  Vitamin D deficiency 03/08/2020   At risk for diabetes mellitus 03/08/2020   Other fatigue 02/03/2020   SOBOE (shortness of breath on exertion) 02/03/2020   Essential hypertension 02/03/2020   Prediabetes 02/03/2020   Atrial fibrillation ([place]) 02/03/2020   Other hyperlipidemia 02/03/2020   Mood disorder ([place]), with emotional eating 02/03/2020   Closed fracture of fibula, lateral malleolus, left 01/13/2020   Colon polyp 01/13/2020   Secondary hypercoagulable state ([place]) 05/20/2019   Pseudogout of knee 01/27/2019   Benign paroxysmal positional vertigo 10/21/2018   Closed dislocation of left hip ([place]) 10/03/2018   Cough 09/16/2018   Paroxysmal atrial fibrillation ([place]) 07/04/2018   Spontaneous dissection of coronary artery 06/22/2018   Ischemic cardiomyopathy 06/22/2018   Near syncope 06/20/2018   Tachycardia 06/20/2018   Nausea 06/20/2018   PVC (premature ventricular contraction)  06/20/2018   History of DVT (deep vein thrombosis) 06/20/2018   NSTEMI (non-ST elevated myocardial infarction) ([place]) 06/20/2018   Onychomycosis 06/19/2018   Venous stasis dermatitis of both lower extremities 06/19/2018   Insomnia 06/19/2018   Osteoarthritis of right glenohumeral joint 06/17/2018   Rotator cuff arthropathy of left shoulder 06/17/2018   History of bilateral hip arthroplasty 03/27/2018   Avascular necrosis of bone of hip ([place]) 03/27/2018   Recurrent deep vein thrombosis (DVT) ([place]) 02/12/2018   Health counseling 02/12/2018   Hyperhomocystinemia ([place]) 01/10/2018   Cellulitis of right foot 01/08/2018   Intermittent claudication ([place]) 12/04/2017   Fissure in skin 12/04/2017   Acute deep vein thrombosis (DVT) of calf muscle vein of right lower extremity ([place]) 11/26/2017   Acute deep vein thrombosis of lower limb ([place]) 11/26/2017   Primary osteoarthritis of first carpometacarpal joint of left hand 11/21/2017   Hand cramps 10/25/2017   High frequency hearing loss, left 09/10/2017   Degeneration of thoracic intervertebral disc 05/30/2017   Mixed hyperlipidemia 04/18/2017   Increased creatine kinase level 04/18/2017   Scoliosis of thoracolumbar spine 06/23/2016   Anxiety, generalized 05/11/2016   Generalized anxiety disorder 05/11/2016   Benign essential hypertension 03/26/2014   Spinal stenosis, lumbar region, with neurogenic claudication 12/21/2013   Class 3 severe obesity with serious comorbidity and body mass index (BMI) of 45.0 to 49.9 in adult [facility]) 08/01/2012   History of bilateral knee arthroplasty with CPPD 01/26/2012   Erectile dysfunction 01/26/2012   Annual physical exam 01/26/2012   Impotence 01/26/2012    Ziyonna Christner Nilda Simmer PT, MPH  07/09/2020, 10:51 AM  [facility] [place], [place], [address] Phone: [phone]   Fax:  [phone]  Name: Travis Huff MRN: [id number] Date of Birth:  [birth year]

## 2020-07-09 NOTE — Patient Instructions (Signed)
Access Code: [email or web address]: [email or web address]: 07/08/2022Prepared by: Miu Chiong HoltExercises  Seated Shoulder Flexion Towel Slide at Table Top Full Range of Motion - 2 x daily - 7 x weekly - 1 sets - 5-10 reps - 10sec hold  Seated Shoulder Scaption Slide at Table Top with Forearm in Neutral - 2 x daily - 7 x weekly - 1 sets - 5-10 reps - 10 sec hold  Seated Shoulder External Rotation AAROM with Cane and Hand in Neutral - 2 x daily - 7 x weekly - 1 sets - 5-10 reps - 5-10 sec hold  Seated Shoulder Flexion - 2 x daily - 7 x weekly - 1-2 sets - 10 reps - 2-3 sec hold  Seated Shoulder Empty Can AROM - 2 x daily - 7 x weekly - 2 sets - 10 reps - 2-3 sec hold  Seated Shoulder External Rotation - 2 x daily - 7 x weekly - 1-2 sets - 10 reps - 2-3 sec hold  Seated Isometric Shoulder External Rotation at Counter - 2 x daily - 7 x weekly - 1-2 sets - 5 reps - 5 sec hold  Seated Isometric Shoulder Extension at Counter - 2 x daily - 7 x weekly - 1-2 sets - 5 reps - 5 sec hold  Seated Isometric Shoulder Abduction at Counter - 2 x daily - 7 x weekly - 1-2 sets - 5 reps - 2-3 sec hold

## 2020-07-13 ENCOUNTER — Encounter: Payer: Self-pay | Admitting: Rehabilitative and Restorative Service Providers"

## 2020-07-14 ENCOUNTER — Other Ambulatory Visit: Payer: Self-pay

## 2020-07-14 ENCOUNTER — Ambulatory Visit (INDEPENDENT_AMBULATORY_CARE_PROVIDER_SITE_OTHER): Payer: 59 | Admitting: Family Medicine

## 2020-07-14 ENCOUNTER — Other Ambulatory Visit (HOSPITAL_BASED_OUTPATIENT_CLINIC_OR_DEPARTMENT_OTHER): Payer: Self-pay

## 2020-07-14 VITALS — BP 140/90 | HR 60 | Temp 97.6°F | Ht 71.0 in | Wt 274.0 lb

## 2020-07-14 DIAGNOSIS — I4891 Unspecified atrial fibrillation: Secondary | ICD-10-CM | POA: Diagnosis not present

## 2020-07-14 DIAGNOSIS — Z9189 Other specified personal risk factors, not elsewhere classified: Secondary | ICD-10-CM | POA: Diagnosis not present

## 2020-07-14 DIAGNOSIS — E559 Vitamin D deficiency, unspecified: Secondary | ICD-10-CM | POA: Diagnosis not present

## 2020-07-14 DIAGNOSIS — Z6841 Body Mass Index (BMI) 40.0 and over, adult: Secondary | ICD-10-CM | POA: Diagnosis not present

## 2020-07-14 MED ORDER — VITAMIN D (ERGOCALCIFEROL) 1.25 MG (50000 UNIT) PO CAPS
ORAL_CAPSULE | ORAL | 0 refills | Status: DC
Start: 2020-07-14 — End: 2020-08-09
  Filled 2020-07-14: qty 4, 28d supply, fill #0

## 2020-07-15 ENCOUNTER — Ambulatory Visit (INDEPENDENT_AMBULATORY_CARE_PROVIDER_SITE_OTHER): Payer: 59 | Admitting: Family Medicine

## 2020-07-16 ENCOUNTER — Encounter: Payer: Self-pay | Admitting: Rehabilitative and Restorative Service Providers"

## 2020-07-16 ENCOUNTER — Ambulatory Visit: Payer: 59 | Admitting: Rehabilitative and Restorative Service Providers"

## 2020-07-16 ENCOUNTER — Other Ambulatory Visit: Payer: Self-pay

## 2020-07-16 DIAGNOSIS — R601 Generalized edema: Secondary | ICD-10-CM

## 2020-07-16 DIAGNOSIS — R29898 Other symptoms and signs involving the musculoskeletal system: Secondary | ICD-10-CM

## 2020-07-16 DIAGNOSIS — G8929 Other chronic pain: Secondary | ICD-10-CM | POA: Diagnosis not present

## 2020-07-16 DIAGNOSIS — M6281 Muscle weakness (generalized): Secondary | ICD-10-CM

## 2020-07-16 DIAGNOSIS — M25512 Pain in left shoulder: Secondary | ICD-10-CM

## 2020-07-16 NOTE — Therapy (Signed)
[place], [place], [address] Phone: [phone]   Fax:  [phone]  Physical Therapy Treatment  Patient Details  Name: Travis Huff MRN: [id number] Date of Birth: 1960-08-17 Referring Provider (PT): Dr Justice Britain   Encounter Date: 07/16/2020   PT End of Session - 07/16/20 1020     Visit Number 3    Number of Visits 24    Date for PT Re-Evaluation 09/28/20    PT Start Time 1017    PT Stop Time 1100    PT Time Calculation (min) 43 min    Activity Tolerance Patient tolerated treatment well             Past Medical History:  Diagnosis Date   Arrhythmia    Arthritis    shoulders, knees,  back,hips   Atrial fibrillation ([place])    Avascular necrosis of bone of left hip (HCC)    Back pain    Balance problem    Blood clot in vein 06/2016   x 2 no bleeding disorders found after back surgery   Complication of anesthesia    ketamine hallucinations   Constipation    Coronary artery disease    Dislocated hip, left, initial encounter ([place]) 10/03/2018   Dysrhythmia 2020   a-fib   Hx of blood clots    Hypertension    Joint pain    Neuromuscular disorder (HCC)    neuropathy bi lat legs knee down   Neuropathy    PAF (paroxysmal atrial fibrillation) (HCC)    Pre-diabetes    Prediabetes    Shakes    SOBOE (shortness of breath on exertion)    Swelling of both lower extremities    Viral pericarditis 20 yrs ago    Past Surgical History:  Procedure Laterality Date   ABDOMINAL EXPOSURE N/A 06/23/2016   Procedure: ABDOMINAL EXPOSURE;  Surgeon: Rosetta Posner, MD;  Location: [facility];  Service: Vascular;  Laterality: N/A;   ANTERIOR LATERAL LUMBAR FUSION 4 LEVELS Right 06/23/2016   Procedure: Right  Lumbar two-three Lumbar three-four Lumbar four-five Anterolateral lumbar interbody fusion;  Surgeon: Erline Levine, MD;  Location: [facility];  Service: Neurosurgery;  Laterality: Right;   ANTERIOR LUMBAR  FUSION N/A 06/23/2016   Procedure: Lumbar five -sacral one  Anterior lumbar interbody fusion with Dr. Sherren Mocha Early for approach;  Surgeon: Erline Levine, MD;  Location: [facility];  Service: Neurosurgery;  Laterality: N/A;   APPLICATION OF INTRAOPERATIVE CT SCAN N/A 06/26/2016   Procedure: APPLICATION OF INTRAOPERATIVE CT SCAN;  Surgeon: Erline Levine, MD;  Location: [facility];  Service: Neurosurgery;  Laterality: N/A;   BACK SURGERY  2018   CARDIAC CATHETERIZATION     CARDIOVASCULAR STRESS TEST     Negative in May 2014   COLONOSCOPY     HERNIA REPAIR     umbilical   HIP CLOSED REDUCTION Left 10/03/2018   Procedure: CLOSED REDUCTION HIP;  Surgeon: Rod Can, MD;  [facility];  Service: Orthopedics;  Laterality: Left;   IR IVC FILTER PLMT / S&I /IMG GUID/MOD SED  03/26/2018   IR RADIOLOGIST EVAL & MGMT  04/16/2019   JOINT REPLACEMENT     LEFT HEART CATH AND CORONARY ANGIOGRAPHY N/A 06/21/2018   Procedure: LEFT HEART CATH AND CORONARY ANGIOGRAPHY;  Surgeon: Jettie Booze, MD;  Location: [facility];  Service: Cardiovascular;  Laterality: N/A;   POSTERIOR LUMBAR FUSION 4 LEVEL N/A 06/26/2016   Procedure: Thoracic ten  to Pelvis fixation with AIRO;  Surgeon: Erline Levine, MD;  Location: [facility];  Service: Neurosurgery;  Laterality: N/A;   REVERSE SHOULDER ARTHROPLASTY Right 09/18/2019   Procedure: REVERSE SHOULDER ARTHROPLASTY;  Surgeon: Justice Britain, MD;  [facility];  Service: Orthopedics;  Laterality: Right;  141min   REVERSE SHOULDER ARTHROPLASTY Left 06/10/2020   Procedure: REVERSE SHOULDER ARTHROPLASTY;  Surgeon: Justice Britain, MD;  [facility];  Service: Orthopedics;  Laterality: Left;  154min   TENDON REPAIR     right hand   TOTAL HIP ARTHROPLASTY Right 05/25/2014   Procedure: RIGHT TOTAL HIP ARTHROPLASTY ANTERIOR APPROACH;  Surgeon: Rod Can, MD;  Location: [facility];  Service: Orthopedics;  Laterality: Right;   TOTAL HIP ARTHROPLASTY Left 03/27/2018   Procedure: TOTAL  HIP ARTHROPLASTY ANTERIOR APPROACH- DA complex;  Surgeon: Rod Can, MD;  [facility];  Service: Orthopedics;  Laterality: Left;   TOTAL KNEE ARTHROPLASTY Bilateral     There were no vitals filed for this visit.   Subjective Assessment - 07/16/20 1020     Subjective Doing well. Still goiing to the pool and moving arm in the water in safe and painfree ranges. Working on exercises at home as well.    Currently in Pain? No/denies    Pain Score 0-No pain    Pain Location Shoulder    Pain Orientation Left                [facility] PT Assessment - 07/16/20 0001       Assessment   Medical Diagnosis Lt Reverse TSA    Referring Provider (PT) Dr Justice Britain    Onset Date/Surgical Date 06/10/20    Hand Dominance Right    Prior Therapy here for Rt reverse TSA      Precautions   Precautions Shoulder      PROM   Overall PROM Comments pt sitting    Left Shoulder Flexion 115 Degrees    Left Shoulder ABduction 80 Degrees   in scapular plane   Left Shoulder External Rotation 45 Degrees   in scapular plane                          [facility] Adult PT Treatment/Exercise - 07/16/20 0001       Shoulder Exercises: Seated   Retraction Strengthening;Both;15 reps    Retraction Limitations scap squeeze    External Rotation AROM;Both;20 reps   limited to ~ 35-40 deg Lt   Flexion AROM;Both;20 reps    Abduction AROM;Both;20 reps   in scapular plane   ABduction Limitations in scpaular plane      Shoulder Exercises: Pulleys   Flexion 2 minutes    Scaption 2 minutes    Other Pulley Exercises horizontal ab/add with UE's ~ 80 deg abd x ~ 10 reps      Shoulder Exercises: Isometric Strengthening   Extension 5X5"    Extension Limitations 2 sets    External Rotation 5X5"    External Rotation Limitations 2 sets    ABduction 5X5"    ABduction Limitations 2 sets    Other Isometric Exercises activation of deltoid sitting - scap squeeze abd to ~ 30 deg x 15 reps 3 sec hold       Vasopneumatic   Number Minutes Vasopneumatic  15 minutes    Vasopnuematic Location  Shoulder    Vasopneumatic Pressure Medium    Vasopneumatic Temperature  34      Manual Therapy  Manual therapy comments pt sitting    Passive ROM PROM Lt shoulder into flexion; scaptioin; ER in scapular plane                      PT Short Term Goals - 07/06/20 1100       PT SHORT TERM GOAL #1   Title Independent in initial HEP    Time 6    Period Weeks    Status New    Target Date 08/17/20      PT SHORT TERM GOAL #2   Title Patient demonstrates and verbalizes post op precautions to protect TSA    Time 6    Period Weeks    Status New    Target Date 08/17/20      PT SHORT TERM GOAL #3   Title Patient demonstrates and verbalizes appropriate positions and postures to support Lt UE and facilitate healing    Time 6    Status New    Target Date 08/17/20      PT SHORT TERM GOAL #4   Title -               PT Long Term Goals - 07/06/20 1246       PT LONG TERM GOAL #1   Title Improve Lt shoulder ROM to allowed motion per MD protocol    Time 12    Period Weeks    Status New    Target Date 09/28/20      PT LONG TERM GOAL #2   Title Functional strength Rt UE to allow patient to reach overhead and use Lt UE for functional activities    Time 12    Period Weeks    Status New    Target Date 09/28/20      PT LONG TERM GOAL #3   Title Independent in HEP    Time 12    Period Weeks    Status New    Target Date 09/28/20      PT LONG TERM GOAL #4   Title Improve functional limitation score to 67    Time 12    Period Weeks    Status New    Target Date 09/28/20      PT LONG TERM GOAL #5   Title -                   Plan - 07/16/20 1022     Clinical Impression Statement No shoulder pain. Patient is progressing well with exercises per protocol. Now doing well with active exercises and isometrics without difficulty.    Rehab Potential Good    PT Frequency  2x / week    PT Duration 6 weeks    PT Treatment/Interventions ADLs/Self Care Home Management;Aquatic Therapy;Cryotherapy;Electrical Stimulation;Iontophoresis 4mg /ml Dexamethasone;Moist Heat;Ultrasound;Therapeutic activities;Therapeutic exercise;Neuromuscular re-education;Manual techniques;Patient/family education;Dry needling;Taping;Vasopneumatic Device    PT Next Visit Plan review and progress exercises per MD protocol; vaso for edema and pain management; manual work/PROM as tolerated    PT Home Exercise Plan [id number]    Consulted and Agree with Plan of Care Patient             Patient will benefit from skilled therapeutic intervention in order to improve the following deficits and impairments:     Visit Diagnosis: Chronic left shoulder pain  Other symptoms and signs involving the musculoskeletal system  Muscle weakness (generalized)  Generalized edema     Problem List Patient Active Problem List   Diagnosis  Date Noted   At risk for dehydration 06/03/2020   At risk for impaired metabolic function 03/50/0938   History of atrial fibrillation 03/23/2020   At risk for complication associated with hypotension 03/23/2020   Vitamin D deficiency 03/08/2020   At risk for diabetes mellitus 03/08/2020   Other fatigue 02/03/2020   SOBOE (shortness of breath on exertion) 02/03/2020   Essential hypertension 02/03/2020   Prediabetes 02/03/2020   Atrial fibrillation ([place]) 02/03/2020   Other hyperlipidemia 02/03/2020   Mood disorder ([place]), with emotional eating 02/03/2020   Closed fracture of fibula, lateral malleolus, left 01/13/2020   Colon polyp 01/13/2020   Secondary hypercoagulable state ([place]) 05/20/2019   Pseudogout of knee 01/27/2019   Benign paroxysmal positional vertigo 10/21/2018   Closed dislocation of left hip ([place]) 10/03/2018   Cough 09/16/2018   Paroxysmal atrial fibrillation ([place]) 07/04/2018   Spontaneous dissection of coronary artery 06/22/2018   Ischemic  cardiomyopathy 06/22/2018   Near syncope 06/20/2018   Tachycardia 06/20/2018   Nausea 06/20/2018   PVC (premature ventricular contraction) 06/20/2018   History of DVT (deep vein thrombosis) 06/20/2018   NSTEMI (non-ST elevated myocardial infarction) ([place]) 06/20/2018   Onychomycosis 06/19/2018   Venous stasis dermatitis of both lower extremities 06/19/2018   Insomnia 06/19/2018   Osteoarthritis of right glenohumeral joint 06/17/2018   Rotator cuff arthropathy of left shoulder 06/17/2018   History of bilateral hip arthroplasty 03/27/2018   Avascular necrosis of bone of hip ([place]) 03/27/2018   Recurrent deep vein thrombosis (DVT) ([place]) 02/12/2018   Health counseling 02/12/2018   Hyperhomocystinemia ([place]) 01/10/2018   Cellulitis of right foot 01/08/2018   Intermittent claudication ([place]) 12/04/2017   Fissure in skin 12/04/2017   Acute deep vein thrombosis (DVT) of calf muscle vein of right lower extremity ([place]) 11/26/2017   Acute deep vein thrombosis of lower limb ([place]) 11/26/2017   Primary osteoarthritis of first carpometacarpal joint of left hand 11/21/2017   Hand cramps 10/25/2017   High frequency hearing loss, left 09/10/2017   Degeneration of thoracic intervertebral disc 05/30/2017   Mixed hyperlipidemia 04/18/2017   Increased creatine kinase level 04/18/2017   Scoliosis of thoracolumbar spine 06/23/2016   Anxiety, generalized 05/11/2016   Generalized anxiety disorder 05/11/2016   Benign essential hypertension 03/26/2014   Spinal stenosis, lumbar region, with neurogenic claudication 12/21/2013   Class 3 severe obesity with serious comorbidity and body mass index (BMI) of 45.0 to 49.9 in adult [facility]) 08/01/2012   History of bilateral knee arthroplasty with CPPD 01/26/2012   Erectile dysfunction 01/26/2012   Annual physical exam 01/26/2012   Impotence 01/26/2012    Felicidad Sugarman Nilda Simmer PT, MPH  07/16/2020, 10:49 AM  [facility] [place], [place], [address] Phone: [phone]   Fax:  [phone]  Name: Travis Huff MRN: [id number] Date of Birth: 30-May-1960

## 2020-07-18 ENCOUNTER — Other Ambulatory Visit (HOSPITAL_COMMUNITY): Payer: Self-pay | Admitting: Sports Medicine

## 2020-07-18 DIAGNOSIS — F5101 Primary insomnia: Secondary | ICD-10-CM

## 2020-07-19 ENCOUNTER — Other Ambulatory Visit (HOSPITAL_BASED_OUTPATIENT_CLINIC_OR_DEPARTMENT_OTHER): Payer: Self-pay

## 2020-07-19 MED ORDER — ZOLPIDEM TARTRATE 10 MG PO TABS
ORAL_TABLET | Freq: Every evening | ORAL | 1 refills | Status: DC | PRN
  Filled 2020-07-19: qty 90, 90d supply, fill #0
  Filled 2020-10-23: qty 90, 90d supply, fill #1

## 2020-07-20 ENCOUNTER — Other Ambulatory Visit (HOSPITAL_BASED_OUTPATIENT_CLINIC_OR_DEPARTMENT_OTHER): Payer: Self-pay

## 2020-07-21 ENCOUNTER — Other Ambulatory Visit (HOSPITAL_BASED_OUTPATIENT_CLINIC_OR_DEPARTMENT_OTHER): Payer: Self-pay

## 2020-07-21 MED FILL — Lisinopril & Hydrochlorothiazide Tab 20-25 MG: ORAL | 90 days supply | Qty: 90 | Fill #1 | Status: AC

## 2020-07-22 ENCOUNTER — Ambulatory Visit: Payer: 59 | Admitting: Cardiology

## 2020-07-23 ENCOUNTER — Encounter: Payer: Self-pay | Admitting: Rehabilitative and Restorative Service Providers"

## 2020-07-23 ENCOUNTER — Other Ambulatory Visit: Payer: Self-pay

## 2020-07-23 ENCOUNTER — Ambulatory Visit: Payer: 59 | Admitting: Rehabilitative and Restorative Service Providers"

## 2020-07-23 DIAGNOSIS — R29898 Other symptoms and signs involving the musculoskeletal system: Secondary | ICD-10-CM | POA: Diagnosis not present

## 2020-07-23 DIAGNOSIS — M25512 Pain in left shoulder: Secondary | ICD-10-CM

## 2020-07-23 DIAGNOSIS — M6281 Muscle weakness (generalized): Secondary | ICD-10-CM | POA: Diagnosis not present

## 2020-07-23 DIAGNOSIS — R601 Generalized edema: Secondary | ICD-10-CM

## 2020-07-23 DIAGNOSIS — G8929 Other chronic pain: Secondary | ICD-10-CM | POA: Diagnosis not present

## 2020-07-23 NOTE — Therapy (Addendum)
[place], [place], [address] Phone: [phone]   Fax:  [phone]  Physical Therapy Treatment  Patient Details  Name: Travis Huff MRN: [id number] Date of Birth: Oct 20, 1960 Referring Provider (PT): Dr Justice Britain   Encounter Date: 07/23/2020   PT End of Session - 07/23/20 1106     Visit Number 4    Number of Visits 24    Date for PT Re-Evaluation 09/28/20    PT Start Time 1104    PT Stop Time 1147    PT Time Calculation (min) 43 min    Activity Tolerance Patient tolerated treatment well             Past Medical History:  Diagnosis Date   Arrhythmia    Arthritis    shoulders, knees,  back,hips   Atrial fibrillation (HCC)    Avascular necrosis of bone of left hip (HCC)    Back pain    Balance problem    Blood clot in vein 06/2016   x 2 no bleeding disorders found after back surgery   Complication of anesthesia    ketamine hallucinations   Constipation    Coronary artery disease    Dislocated hip, left, initial encounter ([place]) 10/03/2018   Dysrhythmia 2020   a-fib   Hx of blood clots    Hypertension    Joint pain    Neuromuscular disorder (HCC)    neuropathy bi lat legs knee down   Neuropathy    PAF (paroxysmal atrial fibrillation) (HCC)    Pre-diabetes    Prediabetes    Shakes    SOBOE (shortness of breath on exertion)    Swelling of both lower extremities    Viral pericarditis 20 yrs ago    Past Surgical History:  Procedure Laterality Date   ABDOMINAL EXPOSURE N/A 06/23/2016   Procedure: ABDOMINAL EXPOSURE;  Surgeon: Rosetta Posner, MD;  Location: [facility];  Service: Vascular;  Laterality: N/A;   ANTERIOR LATERAL LUMBAR FUSION 4 LEVELS Right 06/23/2016   Procedure: Right  Lumbar two-three Lumbar three-four Lumbar four-five Anterolateral lumbar interbody fusion;  Surgeon: Erline Levine, MD;  Location: [facility];  Service: Neurosurgery;  Laterality: Right;   ANTERIOR LUMBAR  FUSION N/A 06/23/2016   Procedure: Lumbar five -sacral one  Anterior lumbar interbody fusion with Dr. Sherren Mocha Early for approach;  Surgeon: Erline Levine, MD;  Location: [facility];  Service: Neurosurgery;  Laterality: N/A;   APPLICATION OF INTRAOPERATIVE CT SCAN N/A 06/26/2016   Procedure: APPLICATION OF INTRAOPERATIVE CT SCAN;  Surgeon: Erline Levine, MD;  Location: [facility];  Service: Neurosurgery;  Laterality: N/A;   BACK SURGERY  2018   CARDIAC CATHETERIZATION     CARDIOVASCULAR STRESS TEST     Negative in May 2014   COLONOSCOPY     HERNIA REPAIR     umbilical   HIP CLOSED REDUCTION Left 10/03/2018   Procedure: CLOSED REDUCTION HIP;  Surgeon: Rod Can, MD;  [facility];  Service: Orthopedics;  Laterality: Left;   IR IVC FILTER PLMT / S&I /IMG GUID/MOD SED  03/26/2018   IR RADIOLOGIST EVAL & MGMT  04/16/2019   JOINT REPLACEMENT     LEFT HEART CATH AND CORONARY ANGIOGRAPHY N/A 06/21/2018   Procedure: LEFT HEART CATH AND CORONARY ANGIOGRAPHY;  Surgeon: Jettie Booze, MD;  Location: [facility];  Service: Cardiovascular;  Laterality: N/A;   POSTERIOR LUMBAR FUSION 4 LEVEL N/A 06/26/2016   Procedure: Thoracic ten  to Pelvis fixation with AIRO;  Surgeon: Erline Levine, MD;  Location: [facility];  Service: Neurosurgery;  Laterality: N/A;   REVERSE SHOULDER ARTHROPLASTY Right 09/18/2019   Procedure: REVERSE SHOULDER ARTHROPLASTY;  Surgeon: Justice Britain, MD;  [facility];  Service: Orthopedics;  Laterality: Right;  114mn   REVERSE SHOULDER ARTHROPLASTY Left 06/10/2020   Procedure: REVERSE SHOULDER ARTHROPLASTY;  Surgeon: SJustice Britain MD;  [facility];  Service: Orthopedics;  Laterality: Left;  1244m   TENDON REPAIR     right hand   TOTAL HIP ARTHROPLASTY Right 05/25/2014   Procedure: RIGHT TOTAL HIP ARTHROPLASTY ANTERIOR APPROACH;  Surgeon: BrRod CanMD;  Location[facility] Service: Orthopedics;  Laterality: Right;   TOTAL HIP ARTHROPLASTY Left 03/27/2018   Procedure: TOTAL  HIP ARTHROPLASTY ANTERIOR APPROACH- DA complex;  Surgeon: SwRod CanMD;  [facility];  Service: Orthopedics;  Laterality: Left;   TOTAL KNEE ARTHROPLASTY Bilateral     There were no vitals filed for this visit.   Subjective Assessment - 07/23/20 1107     Subjective Doing well with no pain. He is going to the pool and doing some exercise in the water as well as working on HEP. No problems with exercises.    Currently in Pain? No/denies    Pain Score 0-No pain    Pain Location Shoulder    Pain Orientation Left                [facility] PT Assessment - 07/23/20 0001       Assessment   Medical Diagnosis Lt Reverse TSA    Referring Provider (PT) Dr KeJustice Britain  Onset Date/Surgical Date 06/10/20    Hand Dominance Right    Next MD Visit 07/26/20    Prior Therapy here for Rt reverse TSA      Precautions   Precautions Shoulder      AROM   Left Shoulder Extension 53 Degrees    Left Shoulder Flexion 112 Degrees    Left Shoulder ABduction 92 Degrees    Left Shoulder External Rotation 52 Degrees      PROM   Left Shoulder Flexion 142 Degrees    Left Shoulder ABduction 112 Degrees   in scapular plane   Left Shoulder External Rotation 52 Degrees   in scapular plane     Strength   Overall Strength Comments not assessed                           [facility] Adult PT Treatment/Exercise - 07/23/20 0001       Shoulder Exercises: Seated   Retraction Strengthening;Both;15 reps    Retraction Limitations scap squeeze    External Rotation AROM;Both;20 reps   limited to ~ 35-40 deg Lt   Flexion AROM;Both;20 reps    Abduction AROM;Both;20 reps   in scapular plane   ABduction Limitations in scpaular plane; UE in elbow extension and flexion    Other Seated Exercises hitchhiker x 15    Other Seated Exercises uppercut x 15      Shoulder Exercises: Pulleys   Flexion 2 minutes    Scaption 2 minutes    Other Pulley Exercises horizontal ab/add with UE's ~ 80 deg abd  x ~ 10 reps      Shoulder Exercises: Isometric Strengthening   Extension 5X5"    Extension Limitations 2 sets    External Rotation 5X5"    External Rotation Limitations 2 sets  Internal Rotation 5X5"    Internal Rotation Limitations 2 sets    ABduction 5X5"    ABduction Limitations 2 sets    Other Isometric Exercises activation of deltoid sitting - scap squeeze abd to ~ 30 deg x 15 reps 3 sec hold      Vasopneumatic   Number Minutes Vasopneumatic  15 minutes    Vasopnuematic Location  Shoulder    Vasopneumatic Pressure Medium    Vasopneumatic Temperature  34                      PT Short Term Goals - 07/06/20 1100       PT SHORT TERM GOAL #1   Title Independent in initial HEP    Time 6    Period Weeks    Status New    Target Date 08/17/20      PT SHORT TERM GOAL #2   Title Patient demonstrates and verbalizes post op precautions to protect TSA    Time 6    Period Weeks    Status New    Target Date 08/17/20      PT SHORT TERM GOAL #3   Title Patient demonstrates and verbalizes appropriate positions and postures to support Lt UE and facilitate healing    Time 6    Status New    Target Date 08/17/20      PT SHORT TERM GOAL #4   Title -               PT Long Term Goals - 07/06/20 1246       PT LONG TERM GOAL #1   Title Improve Lt shoulder ROM to allowed motion per MD protocol    Time 12    Period Weeks    Status New    Target Date 09/28/20      PT LONG TERM GOAL #2   Title Functional strength Rt UE to allow patient to reach overhead and use Lt UE for functional activities    Time 12    Period Weeks    Status New    Target Date 09/28/20      PT LONG TERM GOAL #3   Title Independent in HEP    Time 12    Period Weeks    Status New    Target Date 09/28/20      PT LONG TERM GOAL #4   Title Improve functional limitation score to 67    Time 12    Period Weeks    Status New    Target Date 09/28/20      PT LONG TERM GOAL #5   Title  -                   Plan - 07/23/20 1110     Clinical Impression Statement Continued excellent progress with shoulder rehab. Working on AROM and isometrics. He has not started resistive exercises.    Rehab Potential Good    PT Frequency 2x / week    PT Duration 6 weeks    PT Treatment/Interventions ADLs/Self Care Home Management;Aquatic Therapy;Cryotherapy;Electrical Stimulation;Iontophoresis 17m/ml Dexamethasone;Moist Heat;Ultrasound;Therapeutic activities;Therapeutic exercise;Neuromuscular re-education;Manual techniques;Patient/family education;Dry needling;Taping;Vasopneumatic Device    PT Next Visit Plan review and progress exercises per MD protocol; vaso for edema and pain management; manual work/PROM as tolerated    PT Home Exercise Plan [id number]    Consulted and Agree with Plan of Care Patient  Patient will benefit from skilled therapeutic intervention in order to improve the following deficits and impairments:     Visit Diagnosis: Chronic left shoulder pain  Other symptoms and signs involving the musculoskeletal system  Muscle weakness (generalized)  Generalized edema     Problem List Patient Active Problem List   Diagnosis Date Noted   At risk for dehydration 06/03/2020   At risk for impaired metabolic function 17/00/1749   History of atrial fibrillation 03/23/2020   At risk for complication associated with hypotension 03/23/2020   Vitamin D deficiency 03/08/2020   At risk for diabetes mellitus 03/08/2020   Other fatigue 02/03/2020   SOBOE (shortness of breath on exertion) 02/03/2020   Essential hypertension 02/03/2020   Prediabetes 02/03/2020   Atrial fibrillation ([place]) 02/03/2020   Other hyperlipidemia 02/03/2020   Mood disorder ([place]), with emotional eating 02/03/2020   Closed fracture of fibula, lateral malleolus, left 01/13/2020   Colon polyp 01/13/2020   Secondary hypercoagulable state ([place]) 05/20/2019   Pseudogout of knee  01/27/2019   Benign paroxysmal positional vertigo 10/21/2018   Closed dislocation of left hip ([place]) 10/03/2018   Cough 09/16/2018   Paroxysmal atrial fibrillation ([place]) 07/04/2018   Spontaneous dissection of coronary artery 06/22/2018   Ischemic cardiomyopathy 06/22/2018   Near syncope 06/20/2018   Tachycardia 06/20/2018   Nausea 06/20/2018   PVC (premature ventricular contraction) 06/20/2018   History of DVT (deep vein thrombosis) 06/20/2018   NSTEMI (non-ST elevated myocardial infarction) ([place]) 06/20/2018   Onychomycosis 06/19/2018   Venous stasis dermatitis of both lower extremities 06/19/2018   Insomnia 06/19/2018   Osteoarthritis of right glenohumeral joint 06/17/2018   Rotator cuff arthropathy of left shoulder 06/17/2018   History of bilateral hip arthroplasty 03/27/2018   Avascular necrosis of bone of hip ([place]) 03/27/2018   Recurrent deep vein thrombosis (DVT) ([place]) 02/12/2018   Health counseling 02/12/2018   Hyperhomocystinemia ([place]) 01/10/2018   Cellulitis of right foot 01/08/2018   Intermittent claudication ([place]) 12/04/2017   Fissure in skin 12/04/2017   Acute deep vein thrombosis (DVT) of calf muscle vein of right lower extremity ([place]) 11/26/2017   Acute deep vein thrombosis of lower limb ([place]) 11/26/2017   Primary osteoarthritis of first carpometacarpal joint of left hand 11/21/2017   Hand cramps 10/25/2017   High frequency hearing loss, left 09/10/2017   Degeneration of thoracic intervertebral disc 05/30/2017   Mixed hyperlipidemia 04/18/2017   Increased creatine kinase level 04/18/2017   Scoliosis of thoracolumbar spine 06/23/2016   Anxiety, generalized 05/11/2016   Generalized anxiety disorder 05/11/2016   Benign essential hypertension 03/26/2014   Spinal stenosis, lumbar region, with neurogenic claudication 12/21/2013   Class 3 severe obesity with serious comorbidity and body mass index (BMI) of 45.0 to 49.9 in adult [facility]) 08/01/2012   History of bilateral knee  arthroplasty with CPPD 01/26/2012   Erectile dysfunction 01/26/2012   Annual physical exam 01/26/2012   Impotence 01/26/2012    Josee Speece Nilda Simmer PT, MPH  07/23/2020, 11:38 AM  [facility] [place] [address] [place], [place], [address] Phone: [phone]   Fax:  [phone]  Name: Travis Huff MRN: [id number] Date of Birth: 12/18/1960  PHYSICAL THERAPY DISCHARGE SUMMARY  Visits from Start of Care: 4  Current functional level related to goals / functional outcomes: See progress note for discharge status   Remaining deficits: Unknown   Education / Equipment: HEP    Patient agrees to discharge. Patient goals were partially met. Patient is being discharged  due to not returning since the last visit.  Jola Critzer P. Helene Kelp PT, MPH 08/25/20 11:28 AM

## 2020-07-27 ENCOUNTER — Ambulatory Visit: Payer: 59 | Admitting: Cardiology

## 2020-07-27 ENCOUNTER — Encounter: Payer: Self-pay | Admitting: Cardiology

## 2020-07-27 ENCOUNTER — Other Ambulatory Visit: Payer: Self-pay

## 2020-07-27 VITALS — BP 112/80 | HR 74 | Ht 71.0 in | Wt 274.0 lb

## 2020-07-27 DIAGNOSIS — I480 Paroxysmal atrial fibrillation: Secondary | ICD-10-CM

## 2020-07-27 NOTE — Progress Notes (Signed)
Electrophysiology Office Note   Date:  07/28/2020   ID:  Travis Huff, Travis Huff 09-13-1960, MRN [id number]  PCP:  Silverio Decamp, MD  Cardiologist: Tamala Julian Primary Electrophysiologist:  Tyne Banta Meredith Leeds, MD    No chief complaint on file.    History of Present Illness: Travis Huff is a 60 y.o. male who is being seen today for the evaluation of atrial fibrillation at the request of Silverio Decamp Presenting today for electrophysiology evaluation.    He has a history significant for anxiety, hypertension, obesity, multiple PEs on Eliquis.  He is status post IVC filter, non-STEMI due to coronary artery dissection, paroxysmal atrial fibrillation, and chronic combined systolic and diastolic heart failure due to ischemic cardiomyopathy.  Fortunately his ejection fraction is normalized.  He had a hospitalization for palpitations, weakness, and syncope.  Left heart catheterization showed an LAD dissection with apical narrowing.  He wore a cardiac monitor that showed rapid atrial fibrillation.  He has since been put on Multaq.  Today, denies symptoms of palpitations, chest pain, shortness of breath, orthopnea, PND, lower extremity edema, claudication, dizziness, presyncope, syncope, bleeding, or neurologic sequela. The patient is tolerating medications without difficulties.  He is overall feeling well today.  He has intermittent episodes of weakness and fatigue.  He states that he might be in atrial fibrillation throughout these episodes.  He is unclear as to whether or not he has been having arrhythmias.  He has checked his blood pressure during these episodes which is significantly elevated.  He has a cardia monitor, but he has shaking when he checks his monitor and thus has quite a bit of artifact making interpretation difficult.  He is interested in a Linq monitor implant.   Past Medical History:  Diagnosis Date   Arrhythmia    Arthritis    shoulders, knees,  back,hips    Atrial fibrillation (HCC)    Avascular necrosis of bone of left hip (HCC)    Back pain    Balance problem    Blood clot in vein 06/2016   x 2 no bleeding disorders found after back surgery   Complication of anesthesia    ketamine hallucinations   Constipation    Coronary artery disease    Dislocated hip, left, initial encounter ([place]) 10/03/2018   Dysrhythmia 2020   a-fib   Hx of blood clots    Hypertension    Joint pain    Neuromuscular disorder (HCC)    neuropathy bi lat legs knee down   Neuropathy    PAF (paroxysmal atrial fibrillation) (HCC)    Pre-diabetes    Prediabetes    Shakes    SOBOE (shortness of breath on exertion)    Swelling of both lower extremities    Viral pericarditis 20 yrs ago   Past Surgical History:  Procedure Laterality Date   ABDOMINAL EXPOSURE N/A 06/23/2016   Procedure: ABDOMINAL EXPOSURE;  Surgeon: Rosetta Posner, MD;  Location: [place];  Service: Vascular;  Laterality: N/A;   ANTERIOR LATERAL LUMBAR FUSION 4 LEVELS Right 06/23/2016   Procedure: Right  Lumbar two-three Lumbar three-four Lumbar four-five Anterolateral lumbar interbody fusion;  Surgeon: Erline Levine, MD;  Location: [place];  Service: Neurosurgery;  Laterality: Right;   ANTERIOR LUMBAR FUSION N/A 06/23/2016   Procedure: Lumbar five -sacral one  Anterior lumbar interbody fusion with Dr. Sherren Mocha Early for approach;  Surgeon: Erline Levine, MD;  Location: [place];  Service: Neurosurgery;  Laterality: N/A;   APPLICATION OF INTRAOPERATIVE  CT SCAN N/A 06/26/2016   Procedure: APPLICATION OF INTRAOPERATIVE CT SCAN;  Surgeon: Erline Levine, MD;  Location: [place];  Service: Neurosurgery;  Laterality: N/A;   BACK SURGERY  2018   CARDIAC CATHETERIZATION     CARDIOVASCULAR STRESS TEST     Negative in May 2014   COLONOSCOPY     HERNIA REPAIR     umbilical   HIP CLOSED REDUCTION Left 10/03/2018   Procedure: CLOSED REDUCTION HIP;  Surgeon: Rod Can, MD;  Location: [facility];  Service: Orthopedics;   Laterality: Left;   IR IVC FILTER PLMT / S&I /IMG GUID/MOD SED  03/26/2018   IR RADIOLOGIST EVAL & MGMT  04/16/2019   JOINT REPLACEMENT     LEFT HEART CATH AND CORONARY ANGIOGRAPHY N/A 06/21/2018   Procedure: LEFT HEART CATH AND CORONARY ANGIOGRAPHY;  Surgeon: Jettie Booze, MD;  Location: [place];  Service: Cardiovascular;  Laterality: N/A;   POSTERIOR LUMBAR FUSION 4 LEVEL N/A 06/26/2016   Procedure: Thoracic ten to Pelvis fixation with AIRO;  Surgeon: Erline Levine, MD;  Location: [place];  Service: Neurosurgery;  Laterality: N/A;   REVERSE SHOULDER ARTHROPLASTY Right 09/18/2019   Procedure: REVERSE SHOULDER ARTHROPLASTY;  Surgeon: Justice Britain, MD;  Location: [facility];  Service: Orthopedics;  Laterality: Right;  153mn   REVERSE SHOULDER ARTHROPLASTY Left 06/10/2020   Procedure: REVERSE SHOULDER ARTHROPLASTY;  Surgeon: SJustice Britain MD;  Location: [facility];  Service: Orthopedics;  Laterality: Left;  1244m   TENDON REPAIR     right hand   TOTAL HIP ARTHROPLASTY Right 05/25/2014   Procedure: RIGHT TOTAL HIP ARTHROPLASTY ANTERIOR APPROACH;  Surgeon: BrRod CanMD;  [place] [facility] Service: Orthopedics;  Laterality: Right;   TOTAL HIP ARTHROPLASTY Left 03/27/2018   Procedure: TOTAL HIP ARTHROPLASTY ANTERIOR APPROACH- DA complex;  Surgeon: SwRod CanMD;  Location[facility];  Service: Orthopedics;  Laterality: Left;   TOTAL KNEE ARTHROPLASTY Bilateral      Current Outpatient Medications  Medication Sig Dispense Refill   acetaminophen (TYLENOL) 325 MG tablet Take 650 mg by mouth every 6 (six) hours as needed for mild pain or headache.     apixaban (ELIQUIS) 5 MG TABS tablet TAKE 1 TABLET BY MOUTH TWICE DAILY 180 tablet 2   atorvastatin (LIPITOR) 80 MG tablet TAKE 1 TABLET BY MOUTH DAILY AT 6PM 90 tablet 3   cyclobenzaprine (FLEXERIL) 10 MG tablet Take 1 tablet (10 mg total) by mouth 3 (three) times daily as needed for muscle spasms. 30 tablet 1   diltiazem (CARDIZEM CD) 120  MG 24 hr capsule TAKE 1 CAPSULE (120 MG TOTAL) BY MOUTH DAILY. 90 capsule 3   docusate sodium (COLACE) 100 MG capsule Take 1 capsule (100 mg total) by mouth 2 (two) times daily. 60 capsule 1   dronedarone (MULTAQ) 400 MG tablet TAKE 1 TABLET BY MOUTH TWICE DAILY WITH A MEAL 180 tablet 1   DULoxetine (CYMBALTA) 60 MG capsule TAKE 1 CAPSULE (60 MG TOTAL) BY MOUTH AT BEDTIME. 90 capsule 3   lisinopril-hydrochlorothiazide (ZESTORETIC) 20-25 MG tablet TAKE 1 TABLET BY MOUTH DAILY. 90 tablet 3   metoprolol succinate (TOPROL-XL) 100 MG 24 hr tablet Take 1 tablet (100 mg total) by mouth every morning with or immediately following a meal 90 tablet 3   nitroGLYCERIN (NITROSTAT) 0.4 MG SL tablet Place 1 tablet (0.4 mg total) under the tongue every 5 (five) minutes x 3 doses as needed for chest pain. If taking third dose, call 911.  25 tablet 3   sildenafil (VIAGRA) 100 MG tablet Take 1 tablet (100 mg total) by mouth daily as needed. Avoid use with nitroglycerin 30 tablet 11   Vitamin D, Ergocalciferol, (DRISDOL) 1.25 MG (50000 UNIT) CAPS capsule TAKE 1 CAPSULE (50,000 UNITS TOTAL) BY MOUTH EVERY 7 (SEVEN) DAYS. 4 capsule 0   zolpidem (AMBIEN) 10 MG tablet TAKE 1 TABLET BY MOUTH EVERY NIGHT AT BEDTIME AS NEEDED FOR SLEEP 90 tablet 1   No current facility-administered medications for this visit.    Allergies:   Ketamine and Morphine and related   Social History:  The patient  reports that he quit smoking about 22 years ago. His smoking use included cigarettes. He has a 25.00 pack-year smoking history. His smokeless tobacco use includes snuff. He reports current alcohol use of about 14.0 standard drinks of alcohol per week. He reports that he does not use drugs.   Family History:  The patient's family history includes Healthy in his daughter and daughter. He was adopted.   ROS:  Please see the history of present illness.   Otherwise, review of systems is positive for none.   All other systems are reviewed and  negative.   PHYSICAL EXAM: VS:  BP 112/80 (BP Location: Left Arm, Patient Position: Sitting, Cuff Size: Normal)   Pulse 74   Ht '5\' 11"'$  (1.803 m)   Wt 274 lb (124.3 kg)   SpO2 97%   BMI 38.22 kg/m  , BMI Body mass index is 38.22 kg/m. GEN: Well nourished, well developed, in no acute distress  HEENT: normal  Neck: no JVD, carotid bruits, or masses Cardiac: RRR; no murmurs, rubs, or gallops,no edema  Respiratory:  clear to auscultation bilaterally, normal work of breathing GI: soft, nontender, nondistended, + BS MS: no deformity or atrophy  Skin: warm and dry Neuro:  Strength and sensation are intact Psych: euthymic mood, full affect  EKG:  EKG is not ordered today. Personal review of the ekg ordered 06/01/20 shows sinus rhythm, right superior axis, rate 63, incomplete right bundle branch block  Recent Labs: 02/03/2020: ALT 34; TSH 2.110 05/28/2020: BUN 20; Creatinine, Ser 0.94; Hemoglobin 16.1; Platelets 184; Potassium 4.4; Sodium 137    Lipid Panel     Component Value Date/Time   CHOL 170 02/03/2020 1334   TRIG 83 02/03/2020 1334   HDL 73 02/03/2020 1334   CHOLHDL 2.3 02/03/2020 1334   CHOLHDL 2.8 11/20/2018 1147   VLDL 15 05/23/2012 0610   LDLCALC 82 02/03/2020 1334   LDLCALC 76 11/20/2018 1147     Wt Readings from Last 3 Encounters:  07/27/20 274 lb (124.3 kg)  07/14/20 274 lb (124.3 kg)  06/24/20 278 lb (126.1 kg)      Other studies Reviewed: Additional studies/ records that were reviewed today include: TTE 09/30/2018 Review of the above records today demonstrates:   1. Left ventricular ejection fraction, by visual estimation, is 65 to 70%. The left ventricle has normal function. Normal left ventricular size. There is mildly increased left ventricular hypertrophy.  2. Definity contrast agent was given IV to delineate the left ventricular endocardial borders.  3. Left ventricular diastolic Doppler parameters are indeterminate pattern of LV diastolic filling.  4.  Global right ventricle has normal systolic function.The right ventricular size is normal. No increase in right ventricular wall thickness.  5. The left ventricular function has improved from previous echo  [facility] 06/21/18 Dist LAD lesion is 75% stenosed. 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 There  is mild left ventricular systolic dysfunction. The left ventricular ejection fraction is 35-45% by visual estimate. There is no aortic valve stenosis.    ASSESSMENT AND PLAN:  1.  Paroxysmal atrial fibrillation: Currently on Cardizem, Eliquis, Multaq.  CHA2DS2-VASc of 2.  High risk medication monitoring.  He has had more frequent episodes of atrial fibrillation.  He feels weak and fatigued.  He has a cardia mobile monitor, but this has not shown any atrial fibrillation.  He would like more specific monitoring, as he has difficulty getting the recordings when he is feeling poorly.  We Rodneshia Greenhouse plan for Linq monitor implant.  Risk and benefits were discussed.  Risk include bleeding and infection.  He understand these risks and is agreed to the procedure.  2.  Coronary artery disease: Distal LAD stenosis.  No current chest pain.  3.  Cardiomyopathy: Ejection fraction is fortunately improved.    Current medicines are reviewed at length with the patient today.   The patient does not have concerns regarding his medicines.  The following changes were made today: None  Labs/ tests ordered today include:  No orders of the defined types were placed in this encounter.   Disposition:   FU with Lorilyn Laitinen 6 months  Signed, Abigial Newville Meredith Leeds, MD  07/28/2020 7:44 AM     [facility] [place] [address] [phone] (office) [phone] (fax)  SURGEON:  Allegra Lai, MD     PREPROCEDURE DIAGNOSIS: Atrial fibrillation    POSTPROCEDURE DIAGNOSIS: Atrial fibrillation     PROCEDURES:   1. Implantable loop recorder implantation     INTRODUCTION:  Travis Huff is a 60 y.o. male with a history of syncope who presents today for implantable loop implantation.  He has a history of atrial fibrillation with intermittent palpitations.  It is unclear whether or not his palpitations are due to atrial fibrillation or other arrhythmias.  He presents today for Linq monitor implant for atrial fibrillation monitoring.    DESCRIPTION OF PROCEDURE:  Informed written consent was obtained, and the patient was brought to the electrophysiology lab in a fasting state.  The patient required no sedation for the procedure today.  Mapping over the patient's chest was performed by the EP lab staff to identify the area where electrograms were most prominent for ILR recording.  This area was found to be the left parasternal region over the 3rd-4th intercostal space. The patients left chest was therefore prepped and draped in the usual sterile fashion by the EP lab staff. The skin overlying the left parasternal region was infiltrated with lidocaine for local analgesia.  A 0.5-cm incision was made over the left parasternal region over the 3rd intercostal space.  A subcutaneous ILR pocket was fashioned using a combination of sharp and blunt dissection.  A Medtronic Reveal Linq model [place] [id number] implantable loop recorder was then placed into the pocket  R waves were very prominent and measured 0.75m.  Steri- Strips and a sterile dressing were then applied.  There were no early apparent complications.     CONCLUSIONS:   1. Successful implantation of a Medtronic Reveal LINQ implantable loop recorder for syncope  2. No early apparent complications.   Inell Mimbs MMeredith Leeds MD 07/28/2020 7:44 AM

## 2020-07-27 NOTE — Progress Notes (Signed)
Chief Complaint:   OBESITY Travis Huff is here to discuss his progress with his obesity treatment plan along with follow-up of his obesity related diagnoses.   Today's visit was #: 10 Starting weight: 350 lbs Starting date: 02/03/2020 Today's weight: 274 lbs Today's date: 07/14/2020 Weight change since last visit: 4 lbs Total lbs lost to date: 76 lbs Body mass index is 38.22 kg/m.  Total weight loss percentage to date: -21.71%  Interim History:  Travis Huff had a 4th of July celebration and did some eating and drinking alcohol.  He is upset that he has only lost 4 pounds since his last office visit.  Current Meal Plan: the Category 4 Plan for 85% of the time.  Current Exercise Plan: None.  Assessment/Plan:   Medications Discontinued During This Encounter  Medication Reason   Vitamin D, Ergocalciferol, (DRISDOL) 1.25 MG (50000 UNIT) CAPS capsule Reorder   Meds ordered this encounter  Medications   Vitamin D, Ergocalciferol, (DRISDOL) 1.25 MG (50000 UNIT) CAPS capsule    Sig: TAKE 1 CAPSULE (50,000 UNITS TOTAL) BY MOUTH EVERY 7 (SEVEN) DAYS.    Dispense:  4 capsule    Refill:  0    Ov for rf    1. Atrial fibrillation, unspecified type [facility]) He has had several episodes.  Cardiology is aware.  Makes him nauseated and elevates his blood pressure.  He is due to see Cardiology in 6 days.  Told he may need an ablation.  Plan:  follow-up with Cardiology sooner if symptoms are worsening.  Not currently with symptoms.  Stable.  Medication management per Cardiology.  2. Vitamin D deficiency Not at goal.  He is taking vitamin D 50,000 IU weekly.  Plan: Continue to take prescription Vitamin D '@50'$ ,000 IU every week as prescribed.  Follow-up for routine testing of Vitamin D, at least 2-3 times per year to avoid over-replacement.  He declines labs today and wants to get level checked at the end of August with his A1c.  Lab Results  Component Value Date   VD25OH 26.1 (L) 02/03/2020   - Refill  Vitamin D, Ergocalciferol, (DRISDOL) 1.25 MG (50000 UNIT) CAPS capsule; TAKE 1 CAPSULE (50,000 UNITS TOTAL) BY MOUTH EVERY 7 (SEVEN) DAYS.  Dispense: 4 capsule; Refill: 0  3. At risk for impaired metabolic function Due to Travis Huff's current state of health and medical condition(s), he is at a significantly higher risk for impaired metabolic function.   At least 9 minutes was spent on counseling Travis Huff about these concerns today.  This places the patient at a much greater risk to subsequently develop cardio-pulmonary conditions that can negatively affect the patient's quality of life.  I stressed the importance of reversing these risks factors.  The initial goal is to lose at least 5-10% of starting weight to help reduce risk factors.  Counseling:  Intensive lifestyle modifications discussed with Travis Huff as the most appropriate first line treatment.  he will continue to work on diet, exercise, and weight loss efforts.  We will continue to reassess these conditions on a fairly regular basis in an attempt to decrease the patient's overall morbidity and mortality.  4. Class 3 severe obesity with serious comorbidity and body mass index (BMI) of 45.0 to 49.9 in adult, unspecified obesity type (Travis Huff)  Course: Travis Huff is currently in the action stage of change. As such, his goal is to continue with weight loss efforts.   Nutrition goals: He has agreed to the Category 4 Plan.   Exercise goals:  All adults should avoid inactivity. Some physical activity is better than none, and adults who participate in any amount of physical activity gain some health benefits.  Behavioral modification strategies: increasing water intake, celebration eating strategies, avoiding temptations, and planning for success.  Travis Huff has agreed to follow-up with our clinic in 3 weeks. He was informed of the importance of frequent follow-up visits to maximize his success with intensive lifestyle modifications for his multiple health conditions.    Objective:   Blood pressure 140/90, pulse 60, temperature 97.6 F (36.4 C), height '5\' 11"'$  (1.803 m), weight 274 lb (124.3 kg), SpO2 96 %. Body mass index is 38.22 kg/m.  General: Cooperative, alert, well developed, in no acute distress. HEENT: Conjunctivae and lids unremarkable. Cardiovascular: Regular rhythm.  Lungs: Normal work of breathing. Neurologic: No focal deficits.   Lab Results  Component Value Date   CREATININE 0.94 05/28/2020   BUN 20 05/28/2020   NA 137 05/28/2020   K 4.4 05/28/2020   CL 102 05/28/2020   CO2 25 05/28/2020   Lab Results  Component Value Date   ALT 34 02/03/2020   AST 39 02/03/2020   ALKPHOS 149 (H) 02/03/2020   BILITOT 0.3 02/03/2020   Lab Results  Component Value Date   HGBA1C 5.8 (H) 05/28/2020   HGBA1C 6.0 (H) 02/03/2020   HGBA1C 5.6 06/19/2018   HGBA1C 5.9 (H) 04/17/2017   HGBA1C  12/14/2009    5.5 (NOTE)                                                                       According to the ADA Clinical Practice Recommendations for 2011, when HbA1c is used as a screening test:   >=6.5%   Diagnostic of Diabetes Mellitus           (if abnormal result  is confirmed)  5.7-6.4%   Increased risk of developing Diabetes Mellitus  References:Diagnosis and Classification of Diabetes Mellitus,Diabetes [id number] 1):S62-S69 and Standards of Medical Care in         Diabetes - 2011,Diabetes Care,2011,34  (Suppl 1):S11-S61.   Lab Results  Component Value Date   INSULIN 17.2 02/03/2020   Lab Results  Component Value Date   TSH 2.110 02/03/2020   Lab Results  Component Value Date   CHOL 170 02/03/2020   HDL 73 02/03/2020   LDLCALC 82 02/03/2020   TRIG 83 02/03/2020   CHOLHDL 2.3 02/03/2020   Lab Results  Component Value Date   VD25OH 26.1 (L) 02/03/2020   Lab Results  Component Value Date   WBC 7.7 05/28/2020   HGB 16.1 05/28/2020   HCT 48.3 05/28/2020   MCV 94.0 05/28/2020   PLT 184 05/28/2020   Attestation Statements:    Reviewed by clinician on day of visit: allergies, medications, problem list, medical history, surgical history, family history, social history, and previous encounter notes.  I, Water quality scientist, CMA, am acting as Location manager for [employer], DO.  I have reviewed the above documentation for accuracy and completeness, and I agree with the above. Marjory Sneddon, D.O.  The [place] was signed into law in 2016 which includes the topic of electronic health records.  This provides immediate access to information in  MyChart.  This includes consultation notes, operative notes, office notes, lab results and pathology reports.  If you have any questions about what you read please let us know at your next visit so we can discuss your concerns and take corrective action if need be.  We are right here with you.

## 2020-07-29 ENCOUNTER — Other Ambulatory Visit (HOSPITAL_BASED_OUTPATIENT_CLINIC_OR_DEPARTMENT_OTHER): Payer: Self-pay

## 2020-07-29 MED FILL — Diltiazem HCl Coated Beads Cap ER 24HR 120 MG: ORAL | 90 days supply | Qty: 90 | Fill #1 | Status: AC

## 2020-07-30 ENCOUNTER — Encounter: Payer: 59 | Admitting: Physical Therapy

## 2020-08-02 ENCOUNTER — Telehealth: Payer: Self-pay

## 2020-08-02 NOTE — Telephone Encounter (Signed)
Left message for pt advising that manual transmission has been reviewed and it is not showing anything that may have caused what he is feeling.    Noted that I will forward report to DR. Camnitz for review.

## 2020-08-02 NOTE — Telephone Encounter (Signed)
Patient called back to let us know that he called tech support and the transmission has come through successfully. I assured patient a nurse will call back to go over

## 2020-08-02 NOTE — Telephone Encounter (Signed)
Manual transmission received.  No arrhythmia episodes.

## 2020-08-02 NOTE — Telephone Encounter (Signed)
Spoke with patient and wife, tried to help them get the app set up on their phone so we will be able to see patients information. We were unable to get it to work so patient is calling [employer]link for trouble shooting and will call us back and let us know what they say.

## 2020-08-04 ENCOUNTER — Telehealth: Payer: Self-pay

## 2020-08-04 ENCOUNTER — Other Ambulatory Visit (HOSPITAL_BASED_OUTPATIENT_CLINIC_OR_DEPARTMENT_OTHER): Payer: Self-pay

## 2020-08-04 ENCOUNTER — Ambulatory Visit (INDEPENDENT_AMBULATORY_CARE_PROVIDER_SITE_OTHER): Payer: 59 | Admitting: Family Medicine

## 2020-08-04 MED ORDER — METOPROLOL SUCCINATE ER 50 MG PO TB24: 50.0000 mg | ORAL_TABLET | Freq: Every day | ORAL | 3 refills | Status: DC

## 2020-08-04 NOTE — Telephone Encounter (Signed)
Successful telephone encounter to Travis Huff and wife Travis Huff to follow up on their concerns over "episode" patient had Friday 07/30/20. Per patient and wife patient's HR was in the 64s and patient was symptomatic. Patient discontinued 100 mg metoprolol for fear of symptomatic bradycardia. Called 08/03/20 to see if loop recorder recorded event. Explained to patient and wife that device was programmed to alert for AF and Bradycardia episodes <30bpm for 12 beats. Wife concerned that setting is not sensitive enough. Discussed concerns with Dr. Curt Bears who advised that brady setting could be reprogrammed to detect HR <40 for 12 beats. This was completed via remote programming at this time. Also advised that patient may reduce metoprolol dosage to '50mg'$  daily. 08/04/20 remote transmission reviewed. Manual transmission sent during this encounter. No episodes noted. Presenting rhythm NSR'@100bpm'$ . Of note patient states he is utilizing symptom activator however ECGs are not available for review. Patient and wife will come by clinic tomorrow for face to face assistance with app and activator.

## 2020-08-06 ENCOUNTER — Other Ambulatory Visit (HOSPITAL_BASED_OUTPATIENT_CLINIC_OR_DEPARTMENT_OTHER): Payer: Self-pay

## 2020-08-09 ENCOUNTER — Other Ambulatory Visit (HOSPITAL_BASED_OUTPATIENT_CLINIC_OR_DEPARTMENT_OTHER): Payer: Self-pay

## 2020-08-09 ENCOUNTER — Encounter (INDEPENDENT_AMBULATORY_CARE_PROVIDER_SITE_OTHER): Payer: Self-pay | Admitting: Family Medicine

## 2020-08-09 ENCOUNTER — Ambulatory Visit (INDEPENDENT_AMBULATORY_CARE_PROVIDER_SITE_OTHER): Payer: 59 | Admitting: Family Medicine

## 2020-08-09 ENCOUNTER — Other Ambulatory Visit: Payer: Self-pay

## 2020-08-09 VITALS — BP 117/82 | HR 82 | Temp 98.3°F | Ht 71.0 in | Wt 267.0 lb

## 2020-08-09 DIAGNOSIS — Z6841 Body Mass Index (BMI) 40.0 and over, adult: Secondary | ICD-10-CM | POA: Diagnosis not present

## 2020-08-09 DIAGNOSIS — Z9189 Other specified personal risk factors, not elsewhere classified: Secondary | ICD-10-CM

## 2020-08-09 DIAGNOSIS — E559 Vitamin D deficiency, unspecified: Secondary | ICD-10-CM | POA: Diagnosis not present

## 2020-08-09 DIAGNOSIS — I10 Essential (primary) hypertension: Secondary | ICD-10-CM

## 2020-08-09 DIAGNOSIS — E66813 Obesity, class 3: Secondary | ICD-10-CM

## 2020-08-09 MED ORDER — VITAMIN D (ERGOCALCIFEROL) 1.25 MG (50000 UNIT) PO CAPS
ORAL_CAPSULE | ORAL | 0 refills | Status: DC
  Filled 2020-08-09 – 2020-08-18 (×2): qty 4, 28d supply, fill #0

## 2020-08-10 NOTE — Progress Notes (Signed)
Chief Complaint:   OBESITY Travis Huff is here to discuss his progress with his obesity treatment plan along with follow-up of his obesity related diagnoses. [name] is on the Category 4 Plan and states he is following his eating plan approximately 100% of the time. [name] states he is not currently exercising.  Today's visit was #: 11 Starting weight: 350 lbs Starting date: 02/03/2020 Today's weight: 267 lbs Today's date: 08/09/2020 Total lbs lost to date: 83 Total lbs lost since last in-office visit: 7  Interim History: Travis Huff is here for a follow up office visit.  We reviewed his meal plan and questions were answered.  Patient's food recall appears to be accurate and consistent with what is on plan when he is following it.   When eating on plan, his hunger and cravings are well controlled.   Doy snacks on string cheese, laughing cow, and occasionally yasso bars.  Subjective:   1. Essential hypertension Brogan has a history of Afib. Cardiology placed an event monitor- loop recorder (SubQ placement, which can stay in place up to 4 years). Cardiology decreased metoprolol dose. Symptoms stable for the last 4-5 days at least.  2. Vitamin D deficiency He is currently taking prescription vitamin D 50,000 IU each week. He denies nausea, vomiting or muscle weakness.  Lab Results  Component Value Date   VD25OH 26.1 (L) 02/03/2020   Assessment/Plan:  No orders of the defined types were placed in this encounter.   Medications Discontinued During This Encounter  Medication Reason   Vitamin D, Ergocalciferol, (DRISDOL) 1.25 MG (50000 UNIT) CAPS capsule Reorder     Meds ordered this encounter  Medications   Vitamin D, Ergocalciferol, (DRISDOL) 1.25 MG (50000 UNIT) CAPS capsule    Sig: TAKE 1 CAPSULE (50,000 UNITS TOTAL) BY MOUTH EVERY 7 (SEVEN) DAYS. Need office visit for refill    Dispense:  4 capsule    Refill:  0    Ov for rf     1. Essential hypertension Continue to  monitor BP at home and close follow up with cardiology. Medication management per specialist. Continue prudent nutritional plan and weight loss.  2. Vitamin D deficiency Low Vitamin D level contributes to fatigue and are associated with obesity, breast, and colon cancer. He agrees to continue to take prescription Vitamin D 50,000 IU every week and will follow-up for routine testing of Vitamin D, at least 2-3 times per year to avoid over-replacement.  Refill- Vitamin D, Ergocalciferol, (DRISDOL) 1.25 MG (50000 UNIT) CAPS capsule; TAKE 1 CAPSULE (50,000 UNITS TOTAL) BY MOUTH EVERY 7 (SEVEN) DAYS. Need office visit for refill  Dispense: 4 capsule; Refill: 0  3. At risk for heart disease Due to Travis Huff's current state of health and medical condition(s), he is at a higher risk for heart disease.  This puts the patient at much greater risk to subsequently develop cardiopulmonary conditions that can significantly affect patient's quality of life in a negative manner.    At least 9 minutes were spent on counseling [name] about these concerns today, and I stressed the importance of reversing risks factors of obesity, especially truncal and visceral fat, hypertension, hyperlipidemia, and pre-diabetes.  The initial goal is to lose at least 5-10% of starting weight to help reduce these risk factors.  Counseling:  Intensive lifestyle modifications were discussed with Travis Huff as the most appropriate first line of treatment.  he will continue to work on diet, exercise, and weight loss efforts.  We will continue to  reassess these conditions on a fairly regular basis in an attempt to decrease the patient's overall morbidity and mortality.  Evidence-based interventions for health behavior change were utilized today including the discussion of self monitoring techniques, problem-solving barriers, and SMART goal setting techniques.  Specifically, regarding patient's less desirable eating habits and patterns, we employed the  technique of small changes when Travis Huff has not been able to fully commit to his prudent nutritional plan.  4. Obesity, current BMI 37.3  [name] is currently in the action stage of change. As such, his goal is to continue with weight loss efforts. He has agreed to the Category 4 Plan.   Exercise goals:  As is- as per cardiology at this time.  Behavioral modification strategies: better snacking choices.  Travis Huff has agreed to follow-up with our clinic in 2-3 weeks. He was informed of the importance of frequent follow-up visits to maximize his success with intensive lifestyle modifications for his multiple health conditions.   Objective:   Blood pressure 117/82, pulse 82, temperature 98.3 F (36.8 C), height '5\' 11"'$  (1.803 m), weight 267 lb (121.1 kg), SpO2 96 %. Body mass index is 37.24 kg/m.  General: Cooperative, alert, well developed, in no acute distress. HEENT: Conjunctivae and lids unremarkable. Cardiovascular: Regular rhythm.  Lungs: Normal work of breathing. Neurologic: No focal deficits.   Lab Results  Component Value Date   CREATININE 0.94 05/28/2020   BUN 20 05/28/2020   NA 137 05/28/2020   K 4.4 05/28/2020   CL 102 05/28/2020   CO2 25 05/28/2020   Lab Results  Component Value Date   ALT 34 02/03/2020   AST 39 02/03/2020   ALKPHOS 149 (H) 02/03/2020   BILITOT 0.3 02/03/2020   Lab Results  Component Value Date   HGBA1C 5.8 (H) 05/28/2020   HGBA1C 6.0 (H) 02/03/2020   HGBA1C 5.6 06/19/2018   HGBA1C 5.9 (H) 04/17/2017   HGBA1C  12/14/2009    5.5 (NOTE)                                                                       According to the ADA Clinical Practice Recommendations for 2011, when HbA1c is used as a screening test:   >=6.5%   Diagnostic of Diabetes Mellitus           (if abnormal result  is confirmed)  5.7-6.4%   Increased risk of developing Diabetes Mellitus  References:Diagnosis and Classification of Diabetes Mellitus,Diabetes [id number]  1):S62-S69 and Standards of Medical Care in         Diabetes - 2011,Diabetes Care,2011,34  (Suppl 1):S11-S61.   Lab Results  Component Value Date   INSULIN 17.2 02/03/2020   Lab Results  Component Value Date   TSH 2.110 02/03/2020   Lab Results  Component Value Date   CHOL 170 02/03/2020   HDL 73 02/03/2020   LDLCALC 82 02/03/2020   TRIG 83 02/03/2020   CHOLHDL 2.3 02/03/2020   Lab Results  Component Value Date   VD25OH 26.1 (L) 02/03/2020   Lab Results  Component Value Date   WBC 7.7 05/28/2020   HGB 16.1 05/28/2020   HCT 48.3 05/28/2020   MCV 94.0 05/28/2020   PLT 184 05/28/2020    Attestation Statements:  Reviewed by clinician on day of visit: allergies, medications, problem list, medical history, surgical history, family history, social history, and previous encounter notes.  Coral Ceo, CMA, am acting as transcriptionist for [employer], DO.  I have reviewed the above documentation for accuracy and completeness, and I agree with the above. Marjory Sneddon, D.O.  The [place] was signed into law in 2016 which includes the topic of electronic health records.  This provides immediate access to information in MyChart.  This includes consultation notes, operative notes, office notes, lab results and pathology reports.  If you have any questions about what you read please let us know at your next visit so we can discuss your concerns and take corrective action if need be.  We are right here with you.

## 2020-08-16 ENCOUNTER — Other Ambulatory Visit (HOSPITAL_BASED_OUTPATIENT_CLINIC_OR_DEPARTMENT_OTHER): Payer: Self-pay

## 2020-08-18 ENCOUNTER — Other Ambulatory Visit (HOSPITAL_BASED_OUTPATIENT_CLINIC_OR_DEPARTMENT_OTHER): Payer: Self-pay

## 2020-08-25 ENCOUNTER — Other Ambulatory Visit: Payer: Self-pay

## 2020-08-25 ENCOUNTER — Encounter: Payer: Self-pay | Admitting: Sports Medicine

## 2020-08-25 ENCOUNTER — Ambulatory Visit (INDEPENDENT_AMBULATORY_CARE_PROVIDER_SITE_OTHER): Payer: 59 | Admitting: Sports Medicine

## 2020-08-25 VITALS — BP 135/84 | HR 88 | Ht 71.0 in | Wt 266.0 lb

## 2020-08-25 DIAGNOSIS — Z0000 Encounter for general adult medical examination without abnormal findings: Secondary | ICD-10-CM

## 2020-08-25 NOTE — Progress Notes (Signed)
Subjective:    CC: Annual Physical Exam  HPI:  This patient is here for their annual physical  I reviewed the past medical history, family history, social history, surgical history, and allergies today and no changes were needed.  Please see the problem list section below in epic for further details.  Past Medical History: Past Medical History:  Diagnosis Date   Arrhythmia    Arthritis    shoulders, knees,  back,hips   Atrial fibrillation (HCC)    Avascular necrosis of bone of left hip (HCC)    Back pain    Balance problem    Blood clot in vein 06/2016   x 2 no bleeding disorders found after back surgery   Complication of anesthesia    ketamine hallucinations   Constipation    Coronary artery disease    Dislocated hip, left, initial encounter ([place]) 10/03/2018   Dysrhythmia 2020   a-fib   Hx of blood clots    Hypertension    Joint pain    Neuromuscular disorder (HCC)    neuropathy bi lat legs knee down   Neuropathy    PAF (paroxysmal atrial fibrillation) (HCC)    Pre-diabetes    Prediabetes    Shakes    SOBOE (shortness of breath on exertion)    Swelling of both lower extremities    Viral pericarditis 20 yrs ago   Past Surgical History: Past Surgical History:  Procedure Laterality Date   ABDOMINAL EXPOSURE N/A 06/23/2016   Procedure: ABDOMINAL EXPOSURE;  Surgeon: Rosetta Posner, MD;  Location: [place];  Service: Vascular;  Laterality: N/A;   ANTERIOR LATERAL LUMBAR FUSION 4 LEVELS Right 06/23/2016   Procedure: Right  Lumbar two-three Lumbar three-four Lumbar four-five Anterolateral lumbar interbody fusion;  Surgeon: Erline Levine, MD;  Location: [place];  Service: Neurosurgery;  Laterality: Right;   ANTERIOR LUMBAR FUSION N/A 06/23/2016   Procedure: Lumbar five -sacral one  Anterior lumbar interbody fusion with Dr. Sherren Mocha Early for approach;  Surgeon: Erline Levine, MD;  Location: [place];  Service: Neurosurgery;  Laterality: N/A;   APPLICATION OF INTRAOPERATIVE CT SCAN N/A  06/26/2016   Procedure: APPLICATION OF INTRAOPERATIVE CT SCAN;  Surgeon: Erline Levine, MD;  Location: [place];  Service: Neurosurgery;  Laterality: N/A;   BACK SURGERY  2018   CARDIAC CATHETERIZATION     CARDIOVASCULAR STRESS TEST     Negative in May 2014   COLONOSCOPY     HERNIA REPAIR     umbilical   HIP CLOSED REDUCTION Left 10/03/2018   Procedure: CLOSED REDUCTION HIP;  Surgeon: Rod Can, MD;  Location: [facility];  Service: Orthopedics;  Laterality: Left;   IR IVC FILTER PLMT / S&I /IMG GUID/MOD SED  03/26/2018   IR RADIOLOGIST EVAL & MGMT  04/16/2019   JOINT REPLACEMENT     LEFT HEART CATH AND CORONARY ANGIOGRAPHY N/A 06/21/2018   Procedure: LEFT HEART CATH AND CORONARY ANGIOGRAPHY;  Surgeon: Jettie Booze, MD;  Location: [place] [facility];  Service: Cardiovascular;  Laterality: N/A;   POSTERIOR LUMBAR FUSION 4 LEVEL N/A 06/26/2016   Procedure: Thoracic ten to Pelvis fixation with AIRO;  Surgeon: Erline Levine, MD;  Location: [place];  Service: Neurosurgery;  Laterality: N/A;   REVERSE SHOULDER ARTHROPLASTY Right 09/18/2019   Procedure: REVERSE SHOULDER ARTHROPLASTY;  Surgeon: Justice Britain, MD;  Location: [facility];  Service: Orthopedics;  Laterality: Right;  199mn   REVERSE SHOULDER ARTHROPLASTY Left 06/10/2020   Procedure: REVERSE SHOULDER ARTHROPLASTY;  Surgeon: SOnnie Graham  Lennette Bihari, MD;  Location: [facility];  Service: Orthopedics;  Laterality: Left;  157mn   TENDON REPAIR     right hand   TOTAL HIP ARTHROPLASTY Right 05/25/2014   Procedure: RIGHT TOTAL HIP ARTHROPLASTY ANTERIOR APPROACH;  Surgeon: BRod Can MD;  [place]  Service: Orthopedics;  Laterality: Right;   TOTAL HIP ARTHROPLASTY Left 03/27/2018   Procedure: TOTAL HIP ARTHROPLASTY ANTERIOR APPROACH- DA complex;  Surgeon: SRod Can MD;  Location[facility];  Service: Orthopedics;  Laterality: Left;   TOTAL KNEE ARTHROPLASTY Bilateral    Social History: Social History   Socioeconomic History   Marital status:  Married    Spouse name: JGregary Signs"[name]" RDelucchi  Number of children: 2   Years of education: 12   Highest education level: Not on file  Occupational History   Occupation: PChiropractor   Comment: [phone] Tobacco Use   Smoking status: Former    Packs/day: 1.00    Years: 25.00    Pack years: 25.00    Types: Cigarettes    Quit date: 2000    Years since quitting: 22.6   Smokeless tobacco: Current    Types: Snuff  Vaping Use   Vaping Use: Never used  Substance and Sexual Activity   Alcohol use: Yes    Alcohol/week: 14.0 standard drinks    Types: 14 Cans of beer per week    Comment: 2 beers QD   Drug use: No   Sexual activity: Not on file  Other Topics Concern   Not on file  Social History Narrative   Lives with wife in a one story home.  Has 2 children.  Works as a pPublic relations account executive  Education: high school.    Social Determinants of Health   Financial Resource Strain: Not on file  Food Insecurity: Not on file  Transportation Needs: Not on file  Physical Activity: Not on file  Stress: Not on file  Social Connections: Not on file   Family History: Family History  Adopted: Yes  Problem Relation Age of Onset   Healthy Daughter    Healthy Daughter    Allergies: Allergies  Allergen Reactions   Ketamine     hallucinations   Morphine And Related Itching   Medications: See med rec.  Review of Systems: No headache, visual changes, nausea, vomiting, diarrhea, constipation, dizziness, abdominal pain, skin rash, fevers, chills, night sweats, swollen lymph nodes, weight loss, chest pain, body aches, joint swelling, muscle aches, shortness of breath, mood changes, visual or auditory hallucinations.  Objective:    General: Well Developed, well nourished, and in no acute distress.  Neuro: Alert and oriented x3, extra-ocular muscles intact, sensation grossly intact. Cranial nerves II through XII are intact, motor, sensory, and coordinative functions are all  intact. HEENT: Normocephalic, atraumatic, pupils equal round reactive to light, neck supple, no masses, no lymphadenopathy, thyroid nonpalpable. Oropharynx, nasopharynx, external ear canals are unremarkable. Skin: Warm and dry, no rashes noted.  Cardiac: Regular rate and rhythm, no murmurs rubs or gallops.  Respiratory: Clear to auscultation bilaterally. Not using accessory muscles, speaking in full sentences.  Abdominal: Soft, nontender, nondistended, positive bowel sounds, no masses, no organomegaly.  Musculoskeletal: Shoulder, elbow, wrist, hip, knee, ankle stable, and with full range of motion.  Impression and Recommendations:    The patient was counselled, risk factors were discussed, anticipatory guidance given.  Annual physical exam SJemarcusis here for his routine physical, he is due for colon cancer screening, referral placed for  colonoscopy. Otherwise is doing extremely well, he has lost over 60 pounds with healthy weight and wellness, congratulated. Lab work has already been done, we will add a PSA and that is it.a   ___________________________________________ Gwen Her. Dianah Field, M.D., ABFM., CAQSM. Primary Care and Sports Medicine [facility]  Adjunct Professor of [employer] [facility] [employer]

## 2020-08-25 NOTE — Assessment & Plan Note (Signed)
Travis Huff is here for his routine physical, he is due for colon cancer screening, referral placed for colonoscopy. Otherwise is doing extremely well, he has lost over 60 pounds with healthy weight and wellness, congratulated. Lab work has already been done, we will add a PSA and that is it.a

## 2020-08-26 LAB — PSA, TOTAL AND FREE
PSA, % Free: 11 % (calc) — ABNORMAL LOW (ref >25)
PSA, Free: 0.2 ng/mL
PSA, Total: 1.9 ng/mL (ref <=4.0)

## 2020-08-29 MED FILL — Apixaban Tab 5 MG: ORAL | 90 days supply | Qty: 180 | Fill #1 | Status: AC

## 2020-08-30 ENCOUNTER — Ambulatory Visit (INDEPENDENT_AMBULATORY_CARE_PROVIDER_SITE_OTHER): Payer: 59 | Admitting: Adult Health

## 2020-08-30 ENCOUNTER — Ambulatory Visit: Payer: 59 | Admitting: Cardiology

## 2020-08-30 ENCOUNTER — Other Ambulatory Visit: Payer: Self-pay

## 2020-08-30 ENCOUNTER — Encounter (INDEPENDENT_AMBULATORY_CARE_PROVIDER_SITE_OTHER): Payer: Self-pay | Admitting: Adult Health

## 2020-08-30 ENCOUNTER — Other Ambulatory Visit (HOSPITAL_BASED_OUTPATIENT_CLINIC_OR_DEPARTMENT_OTHER): Payer: Self-pay

## 2020-08-30 VITALS — BP 146/82 | HR 74 | Temp 98.4°F | Ht 71.0 in | Wt 261.0 lb

## 2020-08-30 DIAGNOSIS — R7303 Prediabetes: Secondary | ICD-10-CM | POA: Diagnosis not present

## 2020-08-30 DIAGNOSIS — E559 Vitamin D deficiency, unspecified: Secondary | ICD-10-CM | POA: Diagnosis not present

## 2020-08-30 DIAGNOSIS — Z6841 Body Mass Index (BMI) 40.0 and over, adult: Secondary | ICD-10-CM

## 2020-08-30 DIAGNOSIS — Z9189 Other specified personal risk factors, not elsewhere classified: Secondary | ICD-10-CM | POA: Diagnosis not present

## 2020-08-31 ENCOUNTER — Encounter (INDEPENDENT_AMBULATORY_CARE_PROVIDER_SITE_OTHER): Payer: Self-pay | Admitting: Adult Health

## 2020-08-31 LAB — VITAMIN D 25 HYDROXY (VIT D DEFICIENCY, FRACTURES): Vit D, 25-Hydroxy: 60.9 ng/mL (ref 30.0–100.0)

## 2020-08-31 NOTE — Telephone Encounter (Signed)
Pt response.

## 2020-08-31 NOTE — Progress Notes (Signed)
Chief Complaint:   OBESITY Travis Huff is here to discuss his progress with his obesity treatment plan along with follow-up of his obesity related diagnoses. [name] is on the Category 4 Plan and states he is following his eating plan approximately 85% of the time. [name] states he is not exercising regularly at this time.  Today's visit was #: 12 Starting weight: 350 lbs Starting date: 02/03/2020 Today's weight: 261 lbs Today's date: 08/30/2020 Total lbs lost to date: 89 lbs Total lbs lost since last in-office visit: 6 lbs  Interim History: [name] says that his first goal is to achieve a 100 pound weight loss, and he is currently down 89 pounds!   Next goal is to lose down to 225 pounds.   He had his annual physical with his PCP on 08/25/2020- PSA level drawn- stable.  Subjective:   1. Vitamin D deficiency Vitamin D level 26.1 on 02/03/2020.  He is currently taking prescription vitamin D 50,000 IU each week. He denies nausea, vomiting or muscle weakness.  Lab Results  Component Value Date   VD25OH 60.9 08/30/2020   VD25OH 26.1 (L) 02/03/2020   2. Prediabetes [name] has a diagnosis of prediabetes based on his elevated HgA1c and was informed this puts him at greater risk of developing diabetes. He continues to work on diet and exercise to decrease his risk of diabetes. He denies nausea or hypoglycemia.  On 05/28/2020, A1c was 5.8 - improved from 6.0 on 02/03/2020.  He is not on any BG lowering medications.  Lab Results  Component Value Date   HGBA1C 5.8 (H) 05/28/2020   Lab Results  Component Value Date   INSULIN 17.2 02/03/2020   3. At risk for osteoporosis Travis Huff is at higher risk of osteopenia and osteoporosis due to Vitamin D deficiency and obesity.    Assessment/Plan:   1. Vitamin D deficiency Low Vitamin D level contributes to fatigue and are associated with obesity, breast, and colon cancer. He agrees to continue to take prescription Vitamin D '@50'$ ,000 IU every week.  Check labs  today.  Will refill ergocalciferol after lab result.  - VITAMIN D 25 Hydroxy (Vit-D Deficiency, Fractures)  2. Prediabetes Travis Huff will continue to work on weight loss, exercise, and decreasing simple carbohydrates to help decrease the risk of diabetes.  Continue Category 4 meal plan, remain as active as possible.   3. At risk for osteoporosis Travis Huff was given approximately 15 minutes of osteoporosis prevention counseling today. Travis Huff is at risk for osteopenia and osteoporosis due to his Vitamin D deficiency. He was encouraged to take his Vitamin D and follow his higher calcium diet and increase strengthening exercise to help strengthen his bones and decrease his risk of osteopenia and osteoporosis.  Repetitive spaced learning was employed today to elicit superior memory formation and behavioral change.   4. Obesity, current BMI 37.3  Travis Huff is currently in the action stage of change. As such, his goal is to continue with weight loss efforts. He has agreed to the Category 4 Plan.   Exercise goals:  As is.  Behavioral modification strategies: increasing lean protein intake, decreasing simple carbohydrates, meal planning and cooking strategies, keeping healthy foods in the home, and planning for success.  Travis Huff has agreed to follow-up with our clinic in 3 weeks. He was informed of the importance of frequent follow-up visits to maximize his success with intensive lifestyle modifications for his multiple health conditions.   Objective:   Blood pressure (!) 146/82, pulse 74, temperature  98.4 F (36.9 C), height '5\' 11"'$  (1.803 m), weight 261 lb (118.4 kg), SpO2 97 %. Body mass index is 36.4 kg/m.  General: Cooperative, alert, well developed, in no acute distress. HEENT: Conjunctivae and lids unremarkable. Cardiovascular: Regular rhythm.  Lungs: Normal work of breathing. Neurologic: No focal deficits.   Lab Results  Component Value Date   CREATININE 0.94 05/28/2020   BUN 20 05/28/2020   NA  137 05/28/2020   K 4.4 05/28/2020   CL 102 05/28/2020   CO2 25 05/28/2020   Lab Results  Component Value Date   ALT 34 02/03/2020   AST 39 02/03/2020   ALKPHOS 149 (H) 02/03/2020   BILITOT 0.3 02/03/2020   Lab Results  Component Value Date   HGBA1C 5.8 (H) 05/28/2020   HGBA1C 6.0 (H) 02/03/2020   HGBA1C 5.6 06/19/2018   HGBA1C 5.9 (H) 04/17/2017   HGBA1C  12/14/2009    5.5 (NOTE)                                                                       According to the ADA Clinical Practice Recommendations for 2011, when HbA1c is used as a screening test:   >=6.5%   Diagnostic of Diabetes Mellitus           (if abnormal result  is confirmed)  5.7-6.4%   Increased risk of developing Diabetes Mellitus  References:Diagnosis and Classification of Diabetes Mellitus,Diabetes [id number] 1):S62-S69 and Standards of Medical Care in         Diabetes - 2011,Diabetes Care,2011,34  (Suppl 1):S11-S61.   Lab Results  Component Value Date   INSULIN 17.2 02/03/2020   Lab Results  Component Value Date   TSH 2.110 02/03/2020   Lab Results  Component Value Date   CHOL 170 02/03/2020   HDL 73 02/03/2020   LDLCALC 82 02/03/2020   TRIG 83 02/03/2020   CHOLHDL 2.3 02/03/2020   Lab Results  Component Value Date   VD25OH 60.9 08/30/2020   VD25OH 26.1 (L) 02/03/2020   Lab Results  Component Value Date   WBC 7.7 05/28/2020   HGB 16.1 05/28/2020   HCT 48.3 05/28/2020   MCV 94.0 05/28/2020   PLT 184 05/28/2020   Attestation Statements:   Reviewed by clinician on day of visit: allergies, medications, problem list, medical history, surgical history, family history, social history, and previous encounter notes.  I, Water quality scientist, CMA, am acting as Location manager for Mina Marble, NP.  I have reviewed the above documentation for accuracy and completeness, and I agree with the above. -  Macarius Ruark d. Anna Beaird, NP-C

## 2020-09-03 ENCOUNTER — Ambulatory Visit (INDEPENDENT_AMBULATORY_CARE_PROVIDER_SITE_OTHER): Payer: 59

## 2020-09-03 DIAGNOSIS — I480 Paroxysmal atrial fibrillation: Secondary | ICD-10-CM

## 2020-09-07 LAB — CUP PACEART REMOTE DEVICE CHECK
Date Time Interrogation Session: 20220903084216
Implantable Pulse Generator Implant Date: 20220727

## 2020-09-08 DIAGNOSIS — Z96612 Presence of left artificial shoulder joint: Secondary | ICD-10-CM | POA: Diagnosis not present

## 2020-09-08 DIAGNOSIS — Z96611 Presence of right artificial shoulder joint: Secondary | ICD-10-CM | POA: Diagnosis not present

## 2020-09-13 ENCOUNTER — Encounter: Payer: Self-pay | Admitting: Family Medicine

## 2020-09-13 ENCOUNTER — Ambulatory Visit (INDEPENDENT_AMBULATORY_CARE_PROVIDER_SITE_OTHER): Payer: 59 | Admitting: Family Medicine

## 2020-09-13 ENCOUNTER — Other Ambulatory Visit: Payer: 59

## 2020-09-13 ENCOUNTER — Other Ambulatory Visit: Payer: Self-pay

## 2020-09-13 VITALS — BP 134/75 | HR 66 | Ht 71.0 in | Wt 266.0 lb

## 2020-09-13 DIAGNOSIS — S40022A Contusion of left upper arm, initial encounter: Secondary | ICD-10-CM | POA: Diagnosis not present

## 2020-09-13 NOTE — Progress Notes (Signed)
Pt reports that he was seen by his ortho doctor Dr. Onnie Graham. For a f/u on his L shoulder replacement surgery. And he was had him to do some abduction/adduction exercises which were fine. Then Thursday afternoon he noticed a large bruise on his L arm.

## 2020-09-13 NOTE — Progress Notes (Signed)
Acute Office Visit  Subjective:    Patient ID: Travis Huff, male    DOB: 12/24/1960, 60 y.o.   MRN: [id number]  Chief Complaint  Patient presents with   Edema    HPI Patient is in today for Pt reports that he was seen by his ortho doctor Dr. Onnie Graham for a f/u on his L shoulder replacement surgery. And he was had him to do some abduction/adduction exercises which were fine. He went to bed that night with no problems.  Then Thursday afternoon he noticed a large bruise on his L arm. He says it hasn't been painful.    Past Medical History:  Diagnosis Date   Arrhythmia    Arthritis    shoulders, knees,  back,hips   Atrial fibrillation (HCC)    Avascular necrosis of bone of left hip (HCC)    Back pain    Balance problem    Blood clot in vein 06/2016   x 2 no bleeding disorders found after back surgery   Complication of anesthesia    ketamine hallucinations   Constipation    Coronary artery disease    Dislocated hip, left, initial encounter ([place]) 10/03/2018   Dysrhythmia 2020   a-fib   Hx of blood clots    Hypertension    Joint pain    Neuromuscular disorder (HCC)    neuropathy bi lat legs knee down   Neuropathy    PAF (paroxysmal atrial fibrillation) (HCC)    Pre-diabetes    Prediabetes    Shakes    SOBOE (shortness of breath on exertion)    Swelling of both lower extremities    Viral pericarditis 20 yrs ago    Past Surgical History:  Procedure Laterality Date   ABDOMINAL EXPOSURE N/A 06/23/2016   Procedure: ABDOMINAL EXPOSURE;  Surgeon: Rosetta Posner, MD;  Location: [place];  Service: Vascular;  Laterality: N/A;   ANTERIOR LATERAL LUMBAR FUSION 4 LEVELS Right 06/23/2016   Procedure: Right  Lumbar two-three Lumbar three-four Lumbar four-five Anterolateral lumbar interbody fusion;  Surgeon: Erline Levine, MD;  Location: [place];  Service: Neurosurgery;  Laterality: Right;   ANTERIOR LUMBAR FUSION N/A 06/23/2016   Procedure: Lumbar five -sacral one  Anterior lumbar  interbody fusion with Dr. Sherren Mocha Early for approach;  Surgeon: Erline Levine, MD;  Location: [place];  Service: Neurosurgery;  Laterality: N/A;   APPLICATION OF INTRAOPERATIVE CT SCAN N/A 06/26/2016   Procedure: APPLICATION OF INTRAOPERATIVE CT SCAN;  Surgeon: Erline Levine, MD;  Location: [place];  Service: Neurosurgery;  Laterality: N/A;   BACK SURGERY  2018   CARDIAC CATHETERIZATION     CARDIOVASCULAR STRESS TEST     Negative in May 2014   COLONOSCOPY     HERNIA REPAIR     umbilical   HIP CLOSED REDUCTION Left 10/03/2018   Procedure: CLOSED REDUCTION HIP;  Surgeon: Rod Can, MD;  Location: [facility];  Service: Orthopedics;  Laterality: Left;   IR IVC FILTER PLMT / S&I /IMG GUID/MOD SED  03/26/2018   IR RADIOLOGIST EVAL & MGMT  04/16/2019   JOINT REPLACEMENT     LEFT HEART CATH AND CORONARY ANGIOGRAPHY N/A 06/21/2018   Procedure: LEFT HEART CATH AND CORONARY ANGIOGRAPHY;  Surgeon: Jettie Booze, MD;  Location: [facility];  Service: Cardiovascular;  Laterality: N/A;   POSTERIOR LUMBAR FUSION 4 LEVEL N/A 06/26/2016   Procedure: Thoracic ten to Pelvis fixation with AIRO;  Surgeon: Erline Levine, MD;  Location: [place];  Service: Neurosurgery;  Laterality: N/A;   REVERSE SHOULDER ARTHROPLASTY Right 09/18/2019   Procedure: REVERSE SHOULDER ARTHROPLASTY;  Surgeon: Justice Britain, MD;  Location: [facility];  Service: Orthopedics;  Laterality: Right;  122mn   REVERSE SHOULDER ARTHROPLASTY Left 06/10/2020   Procedure: REVERSE SHOULDER ARTHROPLASTY;  Surgeon: SJustice Britain MD;  Location: [facility];  Service: Orthopedics;  Laterality: Left;  1242m   TENDON REPAIR     right hand   TOTAL HIP ARTHROPLASTY Right 05/25/2014   Procedure: RIGHT TOTAL HIP ARTHROPLASTY ANTERIOR APPROACH;  Surgeon: BrRod CanMD;  [place] [facility] Service: Orthopedics;  Laterality: Right;   TOTAL HIP ARTHROPLASTY Left 03/27/2018   Procedure: TOTAL HIP ARTHROPLASTY ANTERIOR APPROACH- DA complex;  Surgeon: SwRod Can MD;  Location: [facility];  Service: Orthopedics;  Laterality: Left;   TOTAL KNEE ARTHROPLASTY Bilateral     Family History  Adopted: Yes  Problem Relation Age of Onset   Healthy Daughter    Healthy Daughter     Social History   Socioeconomic History   Marital status: Married    Spouse name: JuGregary SignsJody" RoPorrazzo Number of children: 2   Years of education: 12   Highest education level: Not on file  Occupational History   Occupation: PrChiropractor  Comment: [phone] Use   Smoking status: Former    Packs/day: 1.00    Years: 25.00    Pack years: 25.00    Types: Cigarettes    Quit date: 2000    Years since quitting: 22.7   Smokeless tobacco: Current    Types: Snuff  Vaping Use   Vaping Use: Never used  Substance and Sexual Activity   Alcohol use: Yes    Alcohol/week: 14.0 standard drinks    Types: 14 Cans of beer per week    Comment: 2 beers QD   Drug use: No   Sexual activity: Not on file  Other Topics Concern   Not on file  Social History Narrative   Lives with wife in a one story home.  Has 2 children.  Works as a prPublic relations account executive Education: high school.    Social Determinants of Health   Financial Resource Strain: Not on file  Food Insecurity: Not on file  Transportation Needs: Not on file  Physical Activity: Not on file  Stress: Not on file  Social Connections: Not on file  Intimate Partner Violence: Not on file    Outpatient Medications Prior to Visit  Medication Sig Dispense Refill   acetaminophen (TYLENOL) 325 MG tablet Take 650 mg by mouth every 6 (six) hours as needed for mild pain or headache.     apixaban (ELIQUIS) 5 MG TABS tablet TAKE 1 TABLET BY MOUTH TWICE DAILY 180 tablet 2   atorvastatin (LIPITOR) 80 MG tablet TAKE 1 TABLET BY MOUTH DAILY AT 6PM 90 tablet 3   diltiazem (CARDIZEM CD) 120 MG 24 hr capsule TAKE 1 CAPSULE (120 MG TOTAL) BY MOUTH DAILY. 90 capsule 3   docusate sodium (COLACE) 100 MG capsule Take 1 capsule (100  mg total) by mouth 2 (two) times daily. 60 capsule 1   dronedarone (MULTAQ) 400 MG tablet TAKE 1 TABLET BY MOUTH TWICE DAILY WITH A MEAL 180 tablet 1   DULoxetine (CYMBALTA) 60 MG capsule TAKE 1 CAPSULE (60 MG TOTAL) BY MOUTH AT BEDTIME. 90 capsule 3   lisinopril-hydrochlorothiazide (ZESTORETIC) 20-25 MG tablet TAKE 1 TABLET BY MOUTH DAILY. 90 tablet 3   metoprolol  succinate (TOPROL-XL) 50 MG 24 hr tablet Take 1 tablet (50 mg total) by mouth daily. Take with or immediately following a meal. 90 tablet 3   nitroGLYCERIN (NITROSTAT) 0.4 MG SL tablet Place 1 tablet (0.4 mg total) under the tongue every 5 (five) minutes x 3 doses as needed for chest pain. If taking third dose, call 911. 25 tablet 3   sildenafil (VIAGRA) 100 MG tablet Take 1 tablet (100 mg total) by mouth daily as needed. Avoid use with nitroglycerin 30 tablet 11   zolpidem (AMBIEN) 10 MG tablet TAKE 1 TABLET BY MOUTH EVERY NIGHT AT BEDTIME AS NEEDED FOR SLEEP 90 tablet 1   cyclobenzaprine (FLEXERIL) 10 MG tablet Take 1 tablet (10 mg total) by mouth 3 (three) times daily as needed for muscle spasms. 30 tablet 1   No facility-administered medications prior to visit.    Allergies  Allergen Reactions   Ketamine     hallucinations   Morphine And Related Itching    Review of Systems     Objective:    Physical Exam Musculoskeletal:     Comments: Left arm with significant large bruising over the upper arm and medial elbow area.  He is tender directly over the bruising.  Nontender over the medial epicondyles.  Normal range of motion of the elbow.  He has actually pretty good strength with resistance.  Bicep muscle appears a little bit larger on the left compared to his right.    BP 134/75   Pulse 66   Ht '5\' 11"'$  (1.803 m)   Wt 266 lb (120.7 kg)   SpO2 98%   BMI 37.10 kg/m  Wt Readings from Last 3 Encounters:  09/13/20 266 lb (120.7 kg)  08/30/20 261 lb (118.4 kg)  08/25/20 266 lb (120.7 kg)    There are no preventive care  reminders to display for this patient.  There are no preventive care reminders to display for this patient.   Lab Results  Component Value Date   TSH 2.110 02/03/2020   Lab Results  Component Value Date   WBC 7.7 05/28/2020   HGB 16.1 05/28/2020   HCT 48.3 05/28/2020   MCV 94.0 05/28/2020   PLT 184 05/28/2020   Lab Results  Component Value Date   NA 137 05/28/2020   K 4.4 05/28/2020   CO2 25 05/28/2020   GLUCOSE 106 (H) 05/28/2020   BUN 20 05/28/2020   CREATININE 0.94 05/28/2020   BILITOT 0.3 02/03/2020   ALKPHOS 149 (H) 02/03/2020   AST 39 02/03/2020   ALT 34 02/03/2020   PROT 6.6 02/03/2020   ALBUMIN 4.2 02/03/2020   CALCIUM 9.3 05/28/2020   ANIONGAP 10 05/28/2020   Lab Results  Component Value Date   CHOL 170 02/03/2020   Lab Results  Component Value Date   HDL 73 02/03/2020   Lab Results  Component Value Date   LDLCALC 82 02/03/2020   Lab Results  Component Value Date   TRIG 83 02/03/2020   Lab Results  Component Value Date   CHOLHDL 2.3 02/03/2020   Lab Results  Component Value Date   HGBA1C 5.8 (H) 05/28/2020       Assessment & Plan:   Problem List Items Addressed This Visit   None Visit Diagnoses     Arm bruise, left, initial encounter    -  Primary   Relevant Orders   [place] MiscellaneoUS Localization      Upper arm bruising without trauma.  He just had some  abduction and abduction against resistance for the orthopedist the prior day.  I suspect he actually has a bicep head tear or partial tear.  We will schedule for ultrasound for further evaluation.  No orders of the defined types were placed in this encounter.    Beatrice Lecher, MD

## 2020-09-15 NOTE — Progress Notes (Signed)
[facility] Summary Report / Loop Recorder 

## 2020-09-16 ENCOUNTER — Other Ambulatory Visit: Payer: Self-pay

## 2020-09-16 ENCOUNTER — Encounter (INDEPENDENT_AMBULATORY_CARE_PROVIDER_SITE_OTHER): Payer: Self-pay | Admitting: Family Medicine

## 2020-09-16 ENCOUNTER — Ambulatory Visit (INDEPENDENT_AMBULATORY_CARE_PROVIDER_SITE_OTHER): Payer: 59 | Admitting: Family Medicine

## 2020-09-16 ENCOUNTER — Other Ambulatory Visit (HOSPITAL_BASED_OUTPATIENT_CLINIC_OR_DEPARTMENT_OTHER): Payer: Self-pay

## 2020-09-16 VITALS — BP 136/78 | HR 74 | Temp 98.3°F | Ht 71.0 in | Wt 258.0 lb

## 2020-09-16 DIAGNOSIS — Z9189 Other specified personal risk factors, not elsewhere classified: Secondary | ICD-10-CM | POA: Diagnosis not present

## 2020-09-16 DIAGNOSIS — R7303 Prediabetes: Secondary | ICD-10-CM

## 2020-09-16 DIAGNOSIS — E559 Vitamin D deficiency, unspecified: Secondary | ICD-10-CM

## 2020-09-16 DIAGNOSIS — Z6841 Body Mass Index (BMI) 40.0 and over, adult: Secondary | ICD-10-CM | POA: Diagnosis not present

## 2020-09-16 MED ORDER — OZEMPIC (0.25 OR 0.5 MG/DOSE) 2 MG/1.5ML ~~LOC~~ SOPN
0.2500 mg | PEN_INJECTOR | SUBCUTANEOUS | 0 refills | Status: DC
Start: 2020-09-16 — End: 2020-10-01
  Filled 2020-09-16: qty 1.5, 56d supply, fill #0

## 2020-09-17 LAB — HEMOGLOBIN A1C
Est. average glucose Bld gHb Est-mCnc: 111 mg/dL
Hgb A1c MFr Bld: 5.5 % (ref 4.8–5.6)

## 2020-09-20 ENCOUNTER — Ambulatory Visit (INDEPENDENT_AMBULATORY_CARE_PROVIDER_SITE_OTHER): Payer: 59 | Admitting: Family Medicine

## 2020-09-20 NOTE — Progress Notes (Signed)
Chief Complaint:   OBESITY Travis Huff is here to discuss his progress with his obesity treatment plan along with follow-up of his obesity related diagnoses. Travis Huff is on the Category 4 Plan and states he is following his eating plan approximately 80-85% of the time. Travis Huff states he is doing 0 minutes 0 times per week.  Today's visit was #: 13 Starting weight: 350 lbs Starting date: 02/03/2020 Today's weight: 258 lbs Today's date: 09/16/2020 Total lbs lost to date: 92 Total lbs lost since last in-office visit: 3  Interim History: Travis Huff  notes some night lately, he has 3 beers and cheese sticks.  Subjective:   1. Pre-diabetes Travis Huff denies a personal history of pancreatitis or family history of thyroid cancer that he knows of, but he is adopted. He desires a medications to help with weight loss. I discussed labs with the patient today.  2. Vitamin D deficiency Travis Huff is currently taking OTC vitamin D. He denies nausea, vomiting or muscle weakness. I discussed labs with the patient today.  3. At risk for side effect of medication Travis Huff is at risk for drug side effects due to starting a new medication today.  Assessment/Plan:   Orders Placed This Encounter  Procedures   Hemoglobin A1c    There are no discontinued medications.   Meds ordered this encounter  Medications   Semaglutide,0.25 or 0.'5MG'$ /DOS, (OZEMPIC, 0.25 OR 0.5 MG/DOSE,) 2 MG/1.5ML SOPN    Sig: Inject 0.25 mg into the skin once a week.    Dispense:  1.5 mL    Refill:  0    30 d supply;  ** OV for RF **   Do not send RF request     1. Pre-diabetes Travis Huff agreed to start Ozempic 0.25 mg q weekly with no refills. We will check A1c today, and I had an extensive discussion with the patient in regards to risks and benefits of this medications. He will continue to work on weight loss, exercise, and decreasing simple carbohydrates to help decrease the risk of diabetes.   - Hemoglobin A1c - Semaglutide,0.25 or 0.'5MG'$ /DOS,  (OZEMPIC, 0.25 OR 0.5 MG/DOSE,) 2 MG/1.5ML SOPN; Inject 0.25 mg into the skin once a week.  Dispense: 1.5 mL; Refill: 0  2. Vitamin D deficiency Low Vitamin D level contributes to fatigue and are associated with obesity, breast, and colon cancer. I reviewed recent labs with the patient, and his Vit D level is at goal. Travis Huff will continue OTC vitamin D dose and will follow-up for routine testing of Vitamin D, at least 2-3 times per year to avoid over-replacement.  3. At risk for side effect of medication Travis Huff was given approximately 9 minutes of drug side effect counseling today.  We discussed side effect possibility and risk versus benefits. Travis Huff agreed to the medication and will contact this office if these side effects are intolerable.  Repetitive spaced learning was employed today to elicit superior memory formation and behavioral change.   4. Obesity with BMI of 36.1 Travis Huff is currently in the action stage of change. As such, his goal is to continue with weight loss efforts. He has agreed to the Category 4 Plan.   Exercise goals: No exercise has been prescribed at this time, due to physical limitations.  Behavioral modification strategies: travel eating strategies and planning for success.  Travis Huff has agreed to follow-up with our clinic in 2 to 3 weeks. He was informed of the importance of frequent follow-up visits to maximize his success with intensive  lifestyle modifications for his multiple health conditions.   Travis Huff was informed we would discuss his lab results at his next visit unless there is a critical issue that needs to be addressed sooner. Travis Huff agreed to keep his next visit at the agreed upon time to discuss these results.  Objective:   Blood pressure 136/78, pulse 74, temperature 98.3 F (36.8 C), height '5\' 11"'$  (1.803 m), weight 258 lb (117 kg), SpO2 96 %. Body mass index is 35.98 kg/m.  General: Cooperative, alert, well developed, in no acute distress. HEENT:  Conjunctivae and lids unremarkable. Cardiovascular: Regular rhythm.  Lungs: Normal work of breathing. Neurologic: No focal deficits.   Lab Results  Component Value Date   CREATININE 0.94 05/28/2020   BUN 20 05/28/2020   NA 137 05/28/2020   K 4.4 05/28/2020   CL 102 05/28/2020   CO2 25 05/28/2020   Lab Results  Component Value Date   ALT 34 02/03/2020   AST 39 02/03/2020   ALKPHOS 149 (H) 02/03/2020   BILITOT 0.3 02/03/2020   Lab Results  Component Value Date   HGBA1C 5.5 09/16/2020   HGBA1C 5.8 (H) 05/28/2020   HGBA1C 6.0 (H) 02/03/2020   HGBA1C 5.6 06/19/2018   HGBA1C 5.9 (H) 04/17/2017   Lab Results  Component Value Date   INSULIN 17.2 02/03/2020   Lab Results  Component Value Date   TSH 2.110 02/03/2020   Lab Results  Component Value Date   CHOL 170 02/03/2020   HDL 73 02/03/2020   LDLCALC 82 02/03/2020   TRIG 83 02/03/2020   CHOLHDL 2.3 02/03/2020   Lab Results  Component Value Date   VD25OH 60.9 08/30/2020   VD25OH 26.1 (L) 02/03/2020   Lab Results  Component Value Date   WBC 7.7 05/28/2020   HGB 16.1 05/28/2020   HCT 48.3 05/28/2020   MCV 94.0 05/28/2020   PLT 184 05/28/2020   No results found for: IRON, TIBC, FERRITIN  Attestation Statements:   Reviewed by clinician on day of visit: allergies, medications, problem list, medical history, surgical history, family history, social history, and previous encounter notes.   Wilhemena Durie, am acting as transcriptionist for [employer], DO.  I have reviewed the above documentation for accuracy and completeness, and I agree with the above. Marjory Sneddon, D.O.  The [place] was signed into law in 2016 which includes the topic of electronic health records.  This provides immediate access to information in MyChart.  This includes consultation notes, operative notes, office notes, lab results and pathology reports.  If you have any questions about what you read please let us  know at your next visit so we can discuss your concerns and take corrective action if need be.  We are right here with you.

## 2020-09-25 ENCOUNTER — Other Ambulatory Visit (HOSPITAL_COMMUNITY): Payer: Self-pay | Admitting: Physician Assistant

## 2020-09-27 ENCOUNTER — Other Ambulatory Visit (HOSPITAL_BASED_OUTPATIENT_CLINIC_OR_DEPARTMENT_OTHER): Payer: Self-pay

## 2020-09-27 MED ORDER — MULTAQ 400 MG PO TABS
ORAL_TABLET | Freq: Two times a day (BID) | ORAL | 3 refills | Status: DC
  Filled 2020-09-27: qty 180, 90d supply, fill #0
  Filled 2021-01-04: qty 180, 90d supply, fill #1
  Filled 2021-04-13: qty 180, 90d supply, fill #2
  Filled 2021-07-18: qty 180, 30d supply, fill #3
  Filled 2021-07-21: qty 180, 90d supply, fill #3

## 2020-09-30 ENCOUNTER — Encounter (INDEPENDENT_AMBULATORY_CARE_PROVIDER_SITE_OTHER): Payer: Self-pay | Admitting: Family Medicine

## 2020-09-30 ENCOUNTER — Other Ambulatory Visit: Payer: Self-pay

## 2020-09-30 ENCOUNTER — Ambulatory Visit (INDEPENDENT_AMBULATORY_CARE_PROVIDER_SITE_OTHER): Payer: 59 | Admitting: Family Medicine

## 2020-09-30 VITALS — BP 138/82 | HR 56 | Temp 98.3°F | Ht 71.0 in | Wt 261.0 lb

## 2020-09-30 DIAGNOSIS — Z9189 Other specified personal risk factors, not elsewhere classified: Secondary | ICD-10-CM | POA: Diagnosis not present

## 2020-09-30 DIAGNOSIS — Z6841 Body Mass Index (BMI) 40.0 and over, adult: Secondary | ICD-10-CM

## 2020-09-30 DIAGNOSIS — I10 Essential (primary) hypertension: Secondary | ICD-10-CM

## 2020-09-30 DIAGNOSIS — R7303 Prediabetes: Secondary | ICD-10-CM | POA: Diagnosis not present

## 2020-10-01 MED ORDER — OZEMPIC (0.25 OR 0.5 MG/DOSE) 2 MG/1.5ML ~~LOC~~ SOPN: 0.2500 mg | PEN_INJECTOR | SUBCUTANEOUS | 0 refills | Status: DC

## 2020-10-01 NOTE — Progress Notes (Signed)
Chief Complaint:   OBESITY Travis Huff is here to discuss his progress with his obesity treatment plan along with follow-up of his obesity related diagnoses. Travis Huff is on the Category 4 Plan and states he is following his eating plan approximately 60% of the time. Akshith states he is walking on vacation.  Today's visit was #: 14 Starting weight: 350 lbs Starting date: 02/03/2020 Today's weight: 261 lbs Today's date: 09/30/2020 Total lbs lost to date: 89 Total lbs lost since last in-office visit: +3  Interim History: Travis Huff is happy he just gained 3 lbs. He was at the beach for 6 nights and ate off plan the entire time. He has no issues or concerns with plan.  Ready to "get back on it" and he has done extremely well overall  Subjective:   1. Pre-diabetes Travis Huff has a diagnosis of prediabetes based on his elevated HgA1c and was informed this puts him at greater risk of developing diabetes. He continues to work on diet and exercise to decrease his risk of diabetes. He denies nausea or hypoglycemia.  Lab Results  Component Value Date   HGBA1C 5.5 09/16/2020   Lab Results  Component Value Date   INSULIN 17.2 02/03/2020   2. Essential hypertension Travis Huff's highest BP at home was 140/85 and lowest 130/78. He is asymptomatic with no concerns. Medication: Cardizem, lisinopril-HCTZ, metoprolol  3. At risk for side effect of medication Travis Huff is at risk for side effects of medication due to starting a new medication.  Assessment/Plan:  No orders of the defined types were placed in this encounter.   Medications Discontinued During This Encounter  Medication Reason   Semaglutide,0.25 or 0.5MG /DOS, (OZEMPIC, 0.25 OR 0.5 MG/DOSE,) 2 MG/1.5ML SOPN Reorder     Meds ordered this encounter  Medications   Semaglutide,0.25 or 0.5MG /DOS, (OZEMPIC, 0.25 OR 0.5 MG/DOSE,) 2 MG/1.5ML SOPN    Sig: Inject 0.25 mg into the skin once a week.    Dispense:  1.5 mL    Refill:  0    30 d supply;  ** OV for  RF **   Do not send RF request     1. Pre-diabetes Travis Huff will continue to work on weight loss, exercise, and decreasing simple carbohydrates to help decrease the risk of diabetes. Start Ozempic as prescribed. Risks and benefits discussed with pt and demonstration on how to use pen provided. We will closely follow.  2. Essential hypertension Travis Huff is working on healthy weight loss and exercise to improve blood pressure control. We will watch for signs of hypotension as he continues his lifestyle modifications. Check BP at home. Decrease salt intake and decrease eating out. We will closely monitor every 3 weeks.  3. At risk for side effect of medication Travis Huff was given approximately 9 minutes of drug side effect counseling today.  We discussed side effect possibility and risk versus benefits. Travis Huff agreed to the medication and will contact this office if these side effects are intolerable.  Repetitive spaced learning was employed today to elicit superior memory formation and behavioral change.   4. Obesity with current BMI of 36.4  Travis Huff is currently in the action stage of change. As such, his goal is to continue with weight loss efforts. He has agreed to the Category 4 Plan.   Exercise goals:  As is  Behavioral modification strategies: meal planning and cooking strategies and keeping healthy foods in the home.  Travis Huff has agreed to follow-up with our clinic in 3 weeks. He was  informed of the importance of frequent follow-up visits to maximize his success with intensive lifestyle modifications for his multiple health conditions.   Objective:   Blood pressure 138/82, pulse (!) 56, temperature 98.3 F (36.8 C), height 5\' 11"  (1.803 m), weight 261 lb (118.4 kg), SpO2 97 %. Body mass index is 36.4 kg/m.  General: Cooperative, alert, well developed, in no acute distress. HEENT: Conjunctivae and lids unremarkable. Cardiovascular: Regular rhythm.  Lungs: Normal work of breathing. Neurologic:  No focal deficits.   Lab Results  Component Value Date   CREATININE 0.94 05/28/2020   BUN 20 05/28/2020   NA 137 05/28/2020   K 4.4 05/28/2020   CL 102 05/28/2020   CO2 25 05/28/2020   Lab Results  Component Value Date   ALT 34 02/03/2020   AST 39 02/03/2020   ALKPHOS 149 (H) 02/03/2020   BILITOT 0.3 02/03/2020   Lab Results  Component Value Date   HGBA1C 5.5 09/16/2020   HGBA1C 5.8 (H) 05/28/2020   HGBA1C 6.0 (H) 02/03/2020   HGBA1C 5.6 06/19/2018   HGBA1C 5.9 (H) 04/17/2017   Lab Results  Component Value Date   INSULIN 17.2 02/03/2020   Lab Results  Component Value Date   TSH 2.110 02/03/2020   Lab Results  Component Value Date   CHOL 170 02/03/2020   HDL 73 02/03/2020   LDLCALC 82 02/03/2020   TRIG 83 02/03/2020   CHOLHDL 2.3 02/03/2020   Lab Results  Component Value Date   VD25OH 60.9 08/30/2020   VD25OH 26.1 (L) 02/03/2020   Lab Results  Component Value Date   WBC 7.7 05/28/2020   HGB 16.1 05/28/2020   HCT 48.3 05/28/2020   MCV 94.0 05/28/2020   PLT 184 05/28/2020    Attestation Statements:   Reviewed by clinician on day of visit: allergies, medications, problem list, medical history, surgical history, family history, social history, and previous encounter notes.  Coral Ceo, CMA, am acting as transcriptionist for [employer], DO.  I have reviewed the above documentation for accuracy and completeness, and I agree with the above. Marjory Sneddon, D.O.  The [place] was signed into law in 2016 which includes the topic of electronic health records.  This provides immediate access to information in MyChart.  This includes consultation notes, operative notes, office notes, lab results and pathology reports.  If you have any questions about what you read please let us know at your next visit so we can discuss your concerns and take corrective action if need be.  We are right here with you.

## 2020-10-05 ENCOUNTER — Telehealth: Payer: Self-pay | Admitting: Cardiology

## 2020-10-05 NOTE — Telephone Encounter (Signed)
Pt has questions about a bill he is receiving for a device that he has... pt says that insurance is requesting records from Dr. Curt Bears for device procedure to possibly get costs covered and they haven't received anything back... please advise

## 2020-10-05 NOTE — Telephone Encounter (Signed)
Called pt in regards to billing question.  I informed him that I would see message to our billing department to address.  He thanked me for call.

## 2020-10-06 ENCOUNTER — Ambulatory Visit (INDEPENDENT_AMBULATORY_CARE_PROVIDER_SITE_OTHER): Payer: 59

## 2020-10-06 DIAGNOSIS — I255 Ischemic cardiomyopathy: Secondary | ICD-10-CM | POA: Diagnosis not present

## 2020-10-07 LAB — CUP PACEART REMOTE DEVICE CHECK
Date Time Interrogation Session: 20221006083920
Implantable Pulse Generator Implant Date: 20220727

## 2020-10-11 ENCOUNTER — Other Ambulatory Visit (HOSPITAL_BASED_OUTPATIENT_CLINIC_OR_DEPARTMENT_OTHER): Payer: Self-pay

## 2020-10-11 DIAGNOSIS — M25512 Pain in left shoulder: Secondary | ICD-10-CM | POA: Diagnosis not present

## 2020-10-12 NOTE — Patient Instructions (Addendum)
DUE TO COVID-19 ONLY ONE VISITOR IS ALLOWED TO COME WITH YOU AND STAY IN THE WAITING ROOM ONLY DURING PRE OP AND PROCEDURE.   **NO VISITORS ARE ALLOWED IN THE SHORT STAY AREA OR RECOVERY ROOM!!**  IF YOU WILL BE ADMITTED INTO THE HOSPITAL YOU ARE ALLOWED ONLY TWO SUPPORT PEOPLE DURING VISITATION HOURS ONLY (10AM -8PM)   The support person(s) may change daily. The support person(s) must pass our screening, gel in and out, and wear a mask at all times, including in the patient's room. Patients must also wear a mask when staff or their support person are in the room.  No visitors under the age of 72. Any visitor under the age of 101 must be accompanied by an adult.        Your procedure is scheduled on: 10/14/20   Report to [facility] Main Entrance    Report to admitting at : 7:10 AM   Call this number if you have problems the morning of surgery [phone]   Do not eat food :After Midnight.   May have liquids until: 6:50 AM    day of surgery  CLEAR LIQUID DIET  Foods Allowed                                                                     Foods Excluded  Water, Black Coffee and tea, regular and decaf                             liquids that you cannot  Plain Jell-O in any flavor  (No red)                                           see through such as: Fruit ices (not with fruit pulp)                                     milk, soups, orange juice              Iced Popsicles (No red)                                    All solid food                                   Apple juices Sports drinks like Gatorade (No red) Lightly seasoned clear broth or consume(fat free) Sugar,   Sample Menu Breakfast                                Lunch                                     Supper Cranberry juice  Beef broth                            Chicken broth Jell-O                                     Grape juice                           Apple juice Coffee or tea                         Jell-O                                      Popsicle                                                Coffee or tea                        Coffee or tea     Oral Hygiene is also important to reduce your risk of infection.                                    Remember - BRUSH YOUR TEETH THE MORNING OF SURGERY WITH YOUR REGULAR TOOTHPASTE   Do NOT smoke after Midnight   Take these medicines the morning of surgery with A SIP OF WATER: metoprolol,diltiazem,dronedarone.  DO NOT TAKE ANY ORAL DIABETIC MEDICATIONS DAY OF YOUR SURGERY                              You may not have any metal on your body including hair pins, jewelry, and body piercing             Do not wear lotions, powders, perfumes/cologne, or deodorant              Men may shave face and neck.   Do not bring valuables to the hospital. [place].   Contacts, dentures or bridgework may not be worn into surgery.   Bring small overnight bag day of surgery.    Patients discharged on the day of surgery will not be allowed to drive home.   Special Instructions: Bring a copy of your healthcare power of attorney and living will documents         the day of surgery if you haven't scanned them before.              Please read over the following fact sheets you were given: IF YOU HAVE QUESTIONS ABOUT YOUR PRE-OP INSTRUCTIONS PLEASE CALL [phone]   [facility] - Preparing for Surgery Before surgery, you can play an important role.  Because skin is not sterile, your skin needs to be as free of germs as possible.  You can reduce the number of germs on  your skin by washing with CHG (chlorahexidine gluconate) soap before surgery.  CHG is an antiseptic cleaner which kills germs and bonds with the skin to continue killing germs even after washing. Please DO NOT use if you have an allergy to CHG or antibacterial soaps.  If your skin becomes reddened/irritated stop using the CHG  and inform your nurse when you arrive at Short Stay. Do not shave (including legs and underarms) for at least 48 hours prior to the first CHG shower.  You may shave your face/neck. Please follow these instructions carefully:  1.  Shower with CHG Soap the night before surgery and the  morning of Surgery.  2.  If you choose to wash your hair, wash your hair first as usual with your  normal  shampoo.  3.  After you shampoo, rinse your hair and body thoroughly to remove the  shampoo.                           4.  Use CHG as you would any other liquid soap.  You can apply chg directly  to the skin and wash                       Gently with a scrungie or clean washcloth.  5.  Apply the CHG Soap to your body ONLY FROM THE NECK DOWN.   Do not use on face/ open                           Wound or open sores. Avoid contact with eyes, ears mouth and genitals (private parts).                       Wash face,  Genitals (private parts) with your normal soap.             6.  Wash thoroughly, paying special attention to the area where your surgery  will be performed.  7.  Thoroughly rinse your body with warm water from the neck down.  8.  DO NOT shower/wash with your normal soap after using and rinsing off  the CHG Soap.                9.  Pat yourself dry with a clean towel.            10.  Wear clean pajamas.            11.  Place clean sheets on your bed the night of your first shower and do not  sleep with pets. Day of Surgery : Do not apply any lotions/deodorants the morning of surgery.  Please wear clean clothes to the hospital/surgery center.  FAILURE TO FOLLOW THESE INSTRUCTIONS MAY RESULT IN THE CANCELLATION OF YOUR SURGERY PATIENT SIGNATURE_________________________________  NURSE SIGNATURE__________________________________  ________________________________________________________________________  [facility]- Preparing for Total Shoulder Arthroplasty    Before surgery, you can play an important  role. Because skin is not sterile, your skin needs to be as free of germs as possible. You can reduce the number of germs on your skin by using the following products. Benzoyl Peroxide Gel Reduces the number of germs present on the skin Applied twice a day to shoulder area starting two days before surgery    ==================================================================  Please follow these instructions carefully:  BENZOYL PEROXIDE 5% GEL  Please do not use if you have  an allergy to benzoyl peroxide.   If your skin becomes reddened/irritated stop using the benzoyl peroxide.  Starting two days before surgery, apply as follows: Apply benzoyl peroxide in the morning and at night. Apply after taking a shower. If you are not taking a shower clean entire shoulder front, back, and side along with the armpit with a clean wet washcloth.  Place a quarter-sized dollop on your shoulder and rub in thoroughly, making sure to cover the front, back, and side of your shoulder, along with the armpit.   2 days before ____ AM   ____ PM              1 day before ____ AM   ____ PM                         Do this twice a day for two days.  (Last application is the night before surgery, AFTER using the CHG soap as described below).  Do NOT apply benzoyl peroxide gel on the day of surgery.

## 2020-10-12 NOTE — Progress Notes (Signed)
Pt. Need orders for surgery. PAT and labs on 10/13/20.

## 2020-10-13 ENCOUNTER — Encounter (HOSPITAL_COMMUNITY)
Admission: RE | Admit: 2020-10-13 | Discharge: 2020-10-13 | Disposition: A | Discharge status: Home / Self Care | Payer: 59 | Source: Ambulatory Visit | Attending: Orthopedic Surgery | Admitting: Orthopedic Surgery

## 2020-10-13 ENCOUNTER — Other Ambulatory Visit: Payer: Self-pay

## 2020-10-13 ENCOUNTER — Encounter (HOSPITAL_COMMUNITY): Payer: Self-pay

## 2020-10-13 DIAGNOSIS — Z01812 Encounter for preprocedural laboratory examination: Secondary | ICD-10-CM | POA: Diagnosis present

## 2020-10-13 DIAGNOSIS — I480 Paroxysmal atrial fibrillation: Secondary | ICD-10-CM | POA: Diagnosis not present

## 2020-10-13 DIAGNOSIS — I10 Essential (primary) hypertension: Secondary | ICD-10-CM | POA: Diagnosis not present

## 2020-10-13 DIAGNOSIS — Z79899 Other long term (current) drug therapy: Secondary | ICD-10-CM | POA: Insufficient documentation

## 2020-10-13 DIAGNOSIS — Z7901 Long term (current) use of anticoagulants: Secondary | ICD-10-CM | POA: Diagnosis not present

## 2020-10-13 DIAGNOSIS — I2510 Atherosclerotic heart disease of native coronary artery without angina pectoris: Secondary | ICD-10-CM | POA: Diagnosis not present

## 2020-10-13 DIAGNOSIS — Z87891 Personal history of nicotine dependence: Secondary | ICD-10-CM | POA: Insufficient documentation

## 2020-10-13 HISTORY — DX: Presence of other cardiac implants and grafts: Z95.818

## 2020-10-13 LAB — CBC
HCT: 48.2 % (ref 39.0–52.0)
Hemoglobin: 16.2 g/dL (ref 13.0–17.0)
MCH: 32.1 pg (ref 26.0–34.0)
MCHC: 33.6 g/dL (ref 30.0–36.0)
MCV: 95.6 fL (ref 80.0–100.0)
Platelets: 226 10*3/uL (ref 150–400)
RBC: 5.04 MIL/uL (ref 4.22–5.81)
RDW: 14.1 % (ref 11.5–15.5)
WBC: 8.9 10*3/uL (ref 4.0–10.5)
nRBC: 0 % (ref 0.0–0.2)

## 2020-10-13 LAB — BASIC METABOLIC PANEL
Anion gap: 9 (ref 5–15)
BUN: 24 mg/dL — ABNORMAL HIGH (ref 6–20)
CO2: 28 mmol/L (ref 22–32)
Calcium: 9.8 mg/dL (ref 8.9–10.3)
Chloride: 104 mmol/L (ref 98–111)
Creatinine, Ser: 0.93 mg/dL (ref 0.61–1.24)
GFR, Estimated: >60 mL/min (ref >60)
Glucose, Bld: 107 mg/dL — ABNORMAL HIGH (ref 70–99)
Potassium: 4.2 mmol/L (ref 3.5–5.1)
Sodium: 141 mmol/L (ref 135–145)

## 2020-10-13 LAB — GLUCOSE, CAPILLARY: Glucose-Capillary: 104 mg/dL — ABNORMAL HIGH (ref 70–99)

## 2020-10-13 LAB — SURGICAL PCR SCREEN
MRSA, PCR: NEGATIVE
Staphylococcus aureus: NEGATIVE

## 2020-10-13 NOTE — Progress Notes (Signed)
COVID Vaccine Completed: Yes Date COVID Vaccine completed: 09/10/19 x 2 COVID vaccine manufacturer:  Moderna    COVID Test: N/A  PCP - Dr. Aundria Mems Cardiologist - Dr. Daneen Schick. Dr.Camnitz(electrophysiologist)  Chest x-ray -  EKG - 06/01/20 Stress Test -  ECHO - 03/01/18 Cardiac Cath - 06/21/18 Pacemaker/ICD device last checked: A1C: 5.5: 09/16/20 Sleep Study -  CPAP -   Fasting Blood Sugar -  Checks Blood Sugar _____ times a day  Blood Thinner Instructions:Eliquis has been held since 10/12/20. As per Dr. Onnie Graham instructions. Aspirin Instructions: Last Dose:  Anesthesia review: Hx: HTN,Dysrhythmias(Afib),HF(loop recorder),Pre-DIA.  Patient denies shortness of breath, fever, cough and chest pain at PAT appointment   Patient verbalized understanding of instructions that were given to them at the PAT appointment. Patient was also instructed that they will need to review over the PAT instructions again at home before surgery.

## 2020-10-13 NOTE — Anesthesia Preprocedure Evaluation (Addendum)
Anesthesia Evaluation  Patient identified by MRN, date of birth, ID band Patient awake  General Assessment Comment:Complication of anesthesia    ketamine hallucinations    Reviewed: Allergy & Precautions, NPO status , Patient's Chart, lab work & pertinent test results  History of Anesthesia Complications (+) history of anesthetic complications  Airway Mallampati: II  TM Distance: >3 FB Neck ROM: Full    Dental no notable dental hx. (+) Teeth Intact, Missing, Dental Advisory Given,    Pulmonary neg pulmonary ROS, former smoker,    Pulmonary exam normal breath sounds clear to auscultation       Cardiovascular hypertension, + CAD and + Past MI  + dysrhythmias Atrial Fibrillation  Rhythm:Regular Rate:Normal  EKG: 06/01/2020 Rate 63 bpm Normal sinus rhythm Right superior axis deviation Incomplete right bundle branch block Possible Right ventricular hypertrophy Cannot rule out Anterior infarct , age undetermined Abnormal ECG No significant change since last tracing  CV: Echo 09/30/2018 1. Left ventricular ejection fraction, by visual estimation, is 65 to  70%. The left ventricle has normal function. Normal left ventricular size.  There is mildly increased left ventricular hypertrophy.  2. Definity contrast agent was given IV to delineate the left ventricular  endocardial borders.  3. Left ventricular diastolic Doppler parameters are indeterminate  pattern of LV diastolic filling.  4. Global right ventricle has normal systolic function.The right  ventricular size is normal. No increase in right ventricular wall  thickness.  5. The left ventricular function has improved from previous echo   Neuro/Psych PSYCHIATRIC DISORDERS Anxiety  Neuromuscular disease    GI/Hepatic negative GI ROS, Neg liver ROS,   Endo/Other  Morbid obesity  Renal/GU negative Renal ROS  negative genitourinary   Musculoskeletal  (+)  Arthritis , Osteoarthritis,    Abdominal   Peds negative pediatric ROS (+)  Hematology negative hematology ROS (+)   Anesthesia Other Findings   Reproductive/Obstetrics negative OB ROS                           Anesthesia Physical Anesthesia Plan  ASA: 3  Anesthesia Plan: General   Post-op Pain Management: GA combined w/ Regional for post-op pain   Induction: Intravenous  PONV Risk Score and Plan: 2 and Ondansetron and Dexamethasone  Airway Management Planned: Oral ETT  Additional Equipment: None  Intra-op Plan:   Post-operative Plan: Extubation in OR  Informed Consent: I have reviewed the patients History and Physical, chart, labs and discussed the procedure including the risks, benefits and alternatives for the proposed anesthesia with the patient or authorized representative who has indicated his/her understanding and acceptance.     Dental advisory given  Plan Discussed with: Anesthesiologist and CRNA  Anesthesia Plan Comments: (See PAT note 10/13/2020, Travis Felix Ward, PA-C  60 y.o.former smoker with h/o HTN, PAF (on Eliquis, Cardizem, Multaq), CAD, unstable left reverse shoulder arthroplasty scheduled for above procedure 10/14/2020 with Dr. Justice Huff.   S/p left shoulder arthroplasty 06/10/2020 with no anesthesia complications noted.   Last seen by PCP 08/25/2020, stable at this visit.   Per previous anesthesia chart review note, "Per cardiology preoperative evaluation 06/02/20, "Chart reviewed as part of pre-operative protocol coverage. Given past medical history and time since last visit, based on ACC/AHA guidelines,Travis J Robertswould be at acceptable risk for the planned procedure without further cardiovascular testing.   Per visit with atrial fibrillation clinic Travis Huff, [place] "OK for surgery from an afib standpoint with very brief episodes,  none for several days."  Per Dr. Tamala Julian and pharmacy may hold Eliquis 2  days prior to planned procedure." )       Anesthesia Quick Evaluation

## 2020-10-13 NOTE — Progress Notes (Signed)
Anesthesia Chart Review   Case: [id number] Date/Time: 10/14/20 0940   Procedure: Revision Left reverse shoulder arthroplasty (Left: Shoulder) - 170min   Anesthesia type: General   Pre-op diagnosis: unstable left reverse shoulder arthroplasty   [facility]: [facility] ROOM 06 / [facility]   Surgeons: Justice Britain, MD       DISCUSSION:60 y.o. former smoker with h/o HTN, PAF (on Eliquis, Cardizem, Multaq), CAD, unstable left reverse shoulder arthroplasty scheduled for above procedure 10/14/2020 with Dr. Justice Britain.   S/p left shoulder arthroplasty 06/10/2020 with no anesthesia complications noted.   Last seen by PCP 08/25/2020, stable at this visit.   Per previous anesthesia chart review note, "Per cardiology preoperative evaluation 06/02/20, "Chart reviewed as part of pre-operative protocol coverage. Given past medical history and time since last visit, based on ACC/AHA guidelines, Travis Huff would be at acceptable risk for the planned procedure without further cardiovascular testing.    Per visit with atrial fibrillation clinic Clint Fenton, [place] " OK for surgery from an afib standpoint with very brief episodes, none for several days. "   Per Dr. Tamala Julian and pharmacy may hold Eliquis 2 days prior to planned procedure."  Pt reports last dose of Eliquis 10/12/20.   Anticipate pt can proceed with planned procedure barring acute status change.   VS: BP (!) 146/99   Pulse 64   Temp 37 C (Oral)   Ht 6' (1.829 m)   Wt 116.1 kg   SpO2 100%   BMI 34.72 kg/m   PROVIDERS: Silverio Decamp, MD is PCP   Allegra Lai, MD is EP   Daneen Schick, MD is Cardiologist  LABS: Labs reviewed: Acceptable for surgery. (all labs ordered are listed, but only abnormal results are displayed)  Labs Reviewed  BASIC METABOLIC PANEL - Abnormal; Notable for the following components:      Result Value   Glucose, Bld 107 (*)    BUN 24 (*)    All other components within normal limits  GLUCOSE, CAPILLARY -  Abnormal; Notable for the following components:   Glucose-Capillary 104 (*)    All other components within normal limits  SURGICAL PCR SCREEN  CBC     IMAGES:   EKG: 06/01/2020 Rate 63 bpm Normal sinus rhythm Right superior axis deviation Incomplete right bundle branch block Possible Right ventricular hypertrophy Cannot rule out Anterior infarct , age undetermined Abnormal ECG No significant change since last tracing  CV: Echo 09/30/2018  1. Left ventricular ejection fraction, by visual estimation, is 65 to  70%. The left ventricle has normal function. Normal left ventricular size.  There is mildly increased left ventricular hypertrophy.   2. Definity contrast agent was given IV to delineate the left ventricular  endocardial borders.   3. Left ventricular diastolic Doppler parameters are indeterminate  pattern of LV diastolic filling.   4. Global right ventricle has normal systolic function.The right  ventricular size is normal. No increase in right ventricular wall  thickness.   5. The left ventricular function has improved from previous echo  Past Medical History:  Diagnosis Date   Arrhythmia    Arthritis    shoulders, knees,  back,hips   Atrial fibrillation (HCC)    Avascular necrosis of bone of left hip (HCC)    Back pain    Balance problem    Blood clot in vein 06/2016   x 2 no bleeding disorders found after back surgery   Complication of anesthesia    ketamine hallucinations  Constipation    Coronary artery disease    Dislocated hip, left, initial encounter ([place]) 10/03/2018   Dysrhythmia 2020   a-fib   Hx of blood clots    Hypertension    Implantable loop recorder present    Joint pain    Neuromuscular disorder (HCC)    neuropathy bi lat legs knee down   Neuropathy    PAF (paroxysmal atrial fibrillation) (HCC)    Pre-diabetes    Prediabetes    Shakes    SOBOE (shortness of breath on exertion)    Swelling of both lower extremities    Viral  pericarditis 20 yrs ago    Past Surgical History:  Procedure Laterality Date   ABDOMINAL EXPOSURE N/A 06/23/2016   Procedure: ABDOMINAL EXPOSURE;  Surgeon: Rosetta Posner, MD;  Location: [place];  Service: Vascular;  Laterality: N/A;   ANTERIOR LATERAL LUMBAR FUSION 4 LEVELS Right 06/23/2016   Procedure: Right  Lumbar two-three Lumbar three-four Lumbar four-five Anterolateral lumbar interbody fusion;  Surgeon: Erline Levine, MD;  Location: [place];  Service: Neurosurgery;  Laterality: Right;   ANTERIOR LUMBAR FUSION N/A 06/23/2016   Procedure: Lumbar five -sacral one  Anterior lumbar interbody fusion with Dr. Sherren Mocha Early for approach;  Surgeon: Erline Levine, MD;  Location: [place];  Service: Neurosurgery;  Laterality: N/A;   APPLICATION OF INTRAOPERATIVE CT SCAN N/A 06/26/2016   Procedure: APPLICATION OF INTRAOPERATIVE CT SCAN;  Surgeon: Erline Levine, MD;  Location: [place];  Service: Neurosurgery;  Laterality: N/A;   BACK SURGERY  2018   CARDIAC CATHETERIZATION     CARDIOVASCULAR STRESS TEST     Negative in May 2014   COLONOSCOPY     HERNIA REPAIR     umbilical   HIP CLOSED REDUCTION Left 10/03/2018   Procedure: CLOSED REDUCTION HIP;  Surgeon: Rod Can, MD;  [facility];  Service: Orthopedics;  Laterality: Left;   IR IVC FILTER PLMT / S&I /IMG GUID/MOD SED  03/26/2018   IR RADIOLOGIST EVAL & MGMT  04/16/2019   JOINT REPLACEMENT     LEFT HEART CATH AND CORONARY ANGIOGRAPHY N/A 06/21/2018   Procedure: LEFT HEART CATH AND CORONARY ANGIOGRAPHY;  Surgeon: Jettie Booze, MD;  Location: [place] [facility];  Service: Cardiovascular;  Laterality: N/A;   POSTERIOR LUMBAR FUSION 4 LEVEL N/A 06/26/2016   Procedure: Thoracic ten to Pelvis fixation with AIRO;  Surgeon: Erline Levine, MD;  Location: [place];  Service: Neurosurgery;  Laterality: N/A;   REVERSE SHOULDER ARTHROPLASTY Right 09/18/2019   Procedure: REVERSE SHOULDER ARTHROPLASTY;  Surgeon: Justice Britain, MD;  [facility];  Service:  Orthopedics;  Laterality: Right;  114min   REVERSE SHOULDER ARTHROPLASTY Left 06/10/2020   Procedure: REVERSE SHOULDER ARTHROPLASTY;  Surgeon: Justice Britain, MD;  [facility];  Service: Orthopedics;  Laterality: Left;  164min   TENDON REPAIR     right hand   TOTAL HIP ARTHROPLASTY Right 05/25/2014   Procedure: RIGHT TOTAL HIP ARTHROPLASTY ANTERIOR APPROACH;  Surgeon: Rod Can, MD;  Location: [place];  Service: Orthopedics;  Laterality: Right;   TOTAL HIP ARTHROPLASTY Left 03/27/2018   Procedure: TOTAL HIP ARTHROPLASTY ANTERIOR APPROACH- DA complex;  Surgeon: Rod Can, MD;  [facility];  Service: Orthopedics;  Laterality: Left;   TOTAL KNEE ARTHROPLASTY Bilateral     MEDICATIONS:  acetaminophen (TYLENOL) 500 MG tablet   apixaban (ELIQUIS) 5 MG TABS tablet   atorvastatin (LIPITOR) 80 MG tablet   Cholecalciferol (VITAMIN D3) 1.25 MG (50000 UT)  CAPS   diltiazem (CARDIZEM CD) 120 MG 24 hr capsule   docusate sodium (COLACE) 100 MG capsule   dronedarone (MULTAQ) 400 MG tablet   DULoxetine (CYMBALTA) 60 MG capsule   lisinopril-hydrochlorothiazide (ZESTORETIC) 20-25 MG tablet   metoprolol succinate (TOPROL-XL) 50 MG 24 hr tablet   nitroGLYCERIN (NITROSTAT) 0.4 MG SL tablet   Semaglutide,0.25 or 0.5MG /DOS, (OZEMPIC, 0.25 OR 0.5 MG/DOSE,) 2 MG/1.5ML SOPN   sildenafil (VIAGRA) 100 MG tablet   zolpidem (AMBIEN) 10 MG tablet   No current facility-administered medications for this encounter.    Konrad Felix Ward, PA-C [facility] [phone]

## 2020-10-14 ENCOUNTER — Encounter (HOSPITAL_COMMUNITY)
Admission: RE | Disposition: A | Discharge status: Home / Self Care | Payer: Self-pay | Source: Ambulatory Visit | Attending: Orthopedic Surgery

## 2020-10-14 ENCOUNTER — Ambulatory Visit (HOSPITAL_COMMUNITY): Payer: 59 | Admitting: Certified Registered"

## 2020-10-14 ENCOUNTER — Encounter (HOSPITAL_COMMUNITY): Payer: Self-pay | Admitting: Orthopedic Surgery

## 2020-10-14 ENCOUNTER — Ambulatory Visit (HOSPITAL_COMMUNITY)
Admission: RE | Admit: 2020-10-14 | Discharge: 2020-10-14 | Disposition: A | Discharge status: Home / Self Care | Payer: 59 | Source: Ambulatory Visit | Attending: Orthopedic Surgery | Admitting: Orthopedic Surgery

## 2020-10-14 ENCOUNTER — Other Ambulatory Visit (HOSPITAL_BASED_OUTPATIENT_CLINIC_OR_DEPARTMENT_OTHER): Payer: Self-pay

## 2020-10-14 DIAGNOSIS — G8918 Other acute postprocedural pain: Secondary | ICD-10-CM | POA: Diagnosis not present

## 2020-10-14 DIAGNOSIS — Z87891 Personal history of nicotine dependence: Secondary | ICD-10-CM | POA: Insufficient documentation

## 2020-10-14 DIAGNOSIS — T8489XA Other specified complication of internal orthopedic prosthetic devices, implants and grafts, initial encounter: Secondary | ICD-10-CM | POA: Diagnosis not present

## 2020-10-14 DIAGNOSIS — Z6834 Body mass index (BMI) 34.0-34.9, adult: Secondary | ICD-10-CM | POA: Diagnosis not present

## 2020-10-14 DIAGNOSIS — M25312 Other instability, left shoulder: Secondary | ICD-10-CM | POA: Insufficient documentation

## 2020-10-14 DIAGNOSIS — T84028A Dislocation of other internal joint prosthesis, initial encounter: Secondary | ICD-10-CM | POA: Diagnosis not present

## 2020-10-14 DIAGNOSIS — Z96612 Presence of left artificial shoulder joint: Secondary | ICD-10-CM | POA: Diagnosis not present

## 2020-10-14 DIAGNOSIS — I480 Paroxysmal atrial fibrillation: Secondary | ICD-10-CM | POA: Diagnosis not present

## 2020-10-14 HISTORY — PX: TOTAL SHOULDER REVISION: SHX6130

## 2020-10-14 LAB — GLUCOSE, CAPILLARY: Glucose-Capillary: 107 mg/dL — ABNORMAL HIGH (ref 70–99)

## 2020-10-14 SURGERY — REVISION, TOTAL ARTHROPLASTY, SHOULDER
Anesthesia: General | Site: Shoulder | Laterality: Left

## 2020-10-14 MED ORDER — TRANEXAMIC ACID-NACL 1000-0.7 MG/100ML-% IV SOLN
INTRAVENOUS | Status: DC | PRN
  Administered 2020-10-14: 1000 mg via INTRAVENOUS

## 2020-10-14 MED ORDER — ROCURONIUM BROMIDE 10 MG/ML (PF) SYRINGE
PREFILLED_SYRINGE | INTRAVENOUS | Status: AC
  Filled 2020-10-14: qty 10

## 2020-10-14 MED ORDER — ORAL CARE MOUTH RINSE: 15.0000 mL | Freq: Once | OROMUCOSAL | Status: AC

## 2020-10-14 MED ORDER — FENTANYL CITRATE PF 50 MCG/ML IJ SOSY
50.0000 ug | PREFILLED_SYRINGE | Freq: Once | INTRAMUSCULAR | Status: AC
  Administered 2020-10-14: 100 ug via INTRAVENOUS
  Filled 2020-10-14: qty 2

## 2020-10-14 MED ORDER — BUPIVACAINE LIPOSOME 1.3 % IJ SUSP
INTRAMUSCULAR | Status: DC | PRN
  Administered 2020-10-14: 10 mL via PERINEURAL

## 2020-10-14 MED ORDER — FENTANYL CITRATE (PF) 250 MCG/5ML IJ SOLN
INTRAMUSCULAR | Status: DC | PRN
  Administered 2020-10-14 (×2): 50 ug via INTRAVENOUS

## 2020-10-14 MED ORDER — FENTANYL CITRATE (PF) 100 MCG/2ML IJ SOLN
INTRAMUSCULAR | Status: AC
  Filled 2020-10-14: qty 2

## 2020-10-14 MED ORDER — LACTATED RINGERS IV SOLN: INTRAVENOUS | Status: DC

## 2020-10-14 MED ORDER — STERILE WATER FOR IRRIGATION IR SOLN
Status: DC | PRN
  Administered 2020-10-14: 1000 mL

## 2020-10-14 MED ORDER — MIDAZOLAM HCL 2 MG/2ML IJ SOLN
INTRAMUSCULAR | Status: AC
  Administered 2020-10-14: 2 mg via INTRAVENOUS
  Filled 2020-10-14: qty 2

## 2020-10-14 MED ORDER — PROPOFOL 10 MG/ML IV BOLUS
INTRAVENOUS | Status: DC | PRN
  Administered 2020-10-14: 200 mg via INTRAVENOUS

## 2020-10-14 MED ORDER — BUPIVACAINE HCL (PF) 0.5 % IJ SOLN
INTRAMUSCULAR | Status: DC | PRN
  Administered 2020-10-14: 15 mL via PERINEURAL

## 2020-10-14 MED ORDER — OXYCODONE-ACETAMINOPHEN 5-325 MG PO TABS
1.0000 | ORAL_TABLET | ORAL | 0 refills | Status: DC | PRN
  Filled 2020-10-14: qty 20, 4d supply, fill #0

## 2020-10-14 MED ORDER — CEFAZOLIN SODIUM-DEXTROSE 2-4 GM/100ML-% IV SOLN
2.0000 g | Freq: Once | INTRAVENOUS | Status: AC
  Administered 2020-10-14: 2 g via INTRAVENOUS

## 2020-10-14 MED ORDER — LIDOCAINE 2% (20 MG/ML) 5 ML SYRINGE
INTRAMUSCULAR | Status: DC | PRN
  Administered 2020-10-14: 40 mg via INTRAVENOUS

## 2020-10-14 MED ORDER — ONDANSETRON HCL 4 MG/2ML IJ SOLN
INTRAMUSCULAR | Status: AC
  Filled 2020-10-14: qty 2

## 2020-10-14 MED ORDER — TRANEXAMIC ACID-NACL 1000-0.7 MG/100ML-% IV SOLN
INTRAVENOUS | Status: AC
  Filled 2020-10-14: qty 100

## 2020-10-14 MED ORDER — FENTANYL CITRATE PF 50 MCG/ML IJ SOSY: 25.0000 ug | PREFILLED_SYRINGE | INTRAMUSCULAR | Status: DC | PRN

## 2020-10-14 MED ORDER — ONDANSETRON HCL 4 MG/2ML IJ SOLN: 4.0000 mg | Freq: Once | INTRAMUSCULAR | Status: DC | PRN

## 2020-10-14 MED ORDER — ONDANSETRON HCL 4 MG PO TABS
4.0000 mg | ORAL_TABLET | Freq: Three times a day (TID) | ORAL | 0 refills | Status: DC | PRN
  Filled 2020-10-14: qty 10, 4d supply, fill #0

## 2020-10-14 MED ORDER — VANCOMYCIN HCL 1000 MG IV SOLR
INTRAVENOUS | Status: DC | PRN
  Administered 2020-10-14: 1000 mg via TOPICAL

## 2020-10-14 MED ORDER — PROPOFOL 10 MG/ML IV BOLUS
INTRAVENOUS | Status: AC
  Filled 2020-10-14: qty 20

## 2020-10-14 MED ORDER — ACETAMINOPHEN 325 MG PO TABS: 325.0000 mg | ORAL_TABLET | ORAL | Status: DC | PRN

## 2020-10-14 MED ORDER — ACETAMINOPHEN 160 MG/5ML PO SOLN: 325.0000 mg | ORAL | Status: DC | PRN

## 2020-10-14 MED ORDER — ONDANSETRON HCL 4 MG/2ML IJ SOLN
INTRAMUSCULAR | Status: DC | PRN
  Administered 2020-10-14: 4 mg via INTRAVENOUS

## 2020-10-14 MED ORDER — DEXAMETHASONE SODIUM PHOSPHATE 10 MG/ML IJ SOLN
INTRAMUSCULAR | Status: AC
  Filled 2020-10-14: qty 1

## 2020-10-14 MED ORDER — MIDAZOLAM HCL 2 MG/2ML IJ SOLN
1.0000 mg | Freq: Once | INTRAMUSCULAR | Status: AC
  Administered 2020-10-14: 2 mg via INTRAVENOUS
  Filled 2020-10-14: qty 2

## 2020-10-14 MED ORDER — EPHEDRINE SULFATE-NACL 50-0.9 MG/10ML-% IV SOSY
PREFILLED_SYRINGE | INTRAVENOUS | Status: DC | PRN
  Administered 2020-10-14: 5 mg via INTRAVENOUS
  Administered 2020-10-14 (×2): 10 mg via INTRAVENOUS

## 2020-10-14 MED ORDER — VANCOMYCIN HCL 1000 MG IV SOLR
INTRAVENOUS | Status: AC
  Filled 2020-10-14: qty 20

## 2020-10-14 MED ORDER — 0.9 % SODIUM CHLORIDE (POUR BTL) OPTIME
TOPICAL | Status: DC | PRN
  Administered 2020-10-14: 1000 mL

## 2020-10-14 MED ORDER — DEXAMETHASONE SODIUM PHOSPHATE 10 MG/ML IJ SOLN
INTRAMUSCULAR | Status: DC | PRN
  Administered 2020-10-14: 8 mg via INTRAVENOUS

## 2020-10-14 MED ORDER — SUGAMMADEX SODIUM 200 MG/2ML IV SOLN
INTRAVENOUS | Status: DC | PRN
  Administered 2020-10-14: 250 mg via INTRAVENOUS

## 2020-10-14 MED ORDER — CYCLOBENZAPRINE HCL 10 MG PO TABS
10.0000 mg | ORAL_TABLET | Freq: Three times a day (TID) | ORAL | 1 refills | Status: DC | PRN
  Filled 2020-10-14: qty 30, 10d supply, fill #0
  Filled 2020-11-19: qty 30, 10d supply, fill #1

## 2020-10-14 MED ORDER — MEPERIDINE HCL 50 MG/ML IJ SOLN: 6.2500 mg | INTRAMUSCULAR | Status: DC | PRN

## 2020-10-14 MED ORDER — CHLORHEXIDINE GLUCONATE 0.12 % MT SOLN
15.0000 mL | Freq: Once | OROMUCOSAL | Status: AC
  Administered 2020-10-14: 15 mL via OROMUCOSAL

## 2020-10-14 MED ORDER — ROCURONIUM BROMIDE 10 MG/ML (PF) SYRINGE
PREFILLED_SYRINGE | INTRAVENOUS | Status: DC | PRN
  Administered 2020-10-14: 70 mg via INTRAVENOUS

## 2020-10-14 MED ORDER — SUGAMMADEX SODIUM 500 MG/5ML IV SOLN
INTRAVENOUS | Status: AC
  Filled 2020-10-14: qty 5

## 2020-10-14 MED ORDER — PHENYLEPHRINE HCL-NACL 20-0.9 MG/250ML-% IV SOLN
INTRAVENOUS | Status: DC | PRN
  Administered 2020-10-14: 20 ug/min via INTRAVENOUS

## 2020-10-14 MED ORDER — OXYCODONE HCL 5 MG/5ML PO SOLN
5.0000 mg | Freq: Once | ORAL | Status: DC | PRN
Start: 2020-10-14 — End: 2020-10-14

## 2020-10-14 MED ORDER — MIDAZOLAM HCL 2 MG/2ML IJ SOLN: 1.0000 mg | INTRAMUSCULAR | Status: DC

## 2020-10-14 MED ORDER — OXYCODONE HCL 5 MG PO TABS: 5.0000 mg | ORAL_TABLET | Freq: Once | ORAL | Status: DC | PRN

## 2020-10-14 MED ORDER — CEFAZOLIN SODIUM-DEXTROSE 2-4 GM/100ML-% IV SOLN
INTRAVENOUS | Status: AC
  Filled 2020-10-14: qty 100

## 2020-10-14 SURGICAL SUPPLY — 58 items
BAG COUNTER SPONGE SURGICOUNT (BAG) IMPLANT
BAG ZIPLOCK 12X15 (MISCELLANEOUS) ×2 IMPLANT
BLADE SAW SGTL 83.5X18.5 (BLADE) IMPLANT
COOLER ICEMAN CLASSIC (MISCELLANEOUS) IMPLANT
COVER BACK TABLE 60X90IN (DRAPES) ×2 IMPLANT
COVER SURGICAL LIGHT HANDLE (MISCELLANEOUS) ×2 IMPLANT
DERMABOND ADVANCED (GAUZE/BANDAGES/DRESSINGS) ×1
DERMABOND ADVANCED .7 DNX12 (GAUZE/BANDAGES/DRESSINGS) ×1 IMPLANT
DRAPE INCISE IOBAN 66X45 STRL (DRAPES) IMPLANT
DRAPE ORTHO SPLIT 77X108 STRL (DRAPES) ×2
DRAPE SHEET LG 3/4 BI-LAMINATE (DRAPES) ×2 IMPLANT
DRAPE SURG 17X11 SM STRL (DRAPES) ×2 IMPLANT
DRAPE SURG ORHT 6 SPLT 77X108 (DRAPES) ×2 IMPLANT
DRAPE U-SHAPE 47X51 STRL (DRAPES) ×2 IMPLANT
DRSG AQUACEL AG ADV 3.5X10 (GAUZE/BANDAGES/DRESSINGS) ×2 IMPLANT
DURAPREP 26ML APPLICATOR (WOUND CARE) ×2 IMPLANT
ELECT BLADE TIP CTD 4 INCH (ELECTRODE) IMPLANT
ELECT REM PT RETURN 15FT ADLT (MISCELLANEOUS) ×2 IMPLANT
FACESHIELD WRAPAROUND (MASK) ×6 IMPLANT
GLOVE SURG ENC MOIS LTX SZ7.5 (GLOVE) ×2 IMPLANT
GLOVE SURG ENC MOIS LTX SZ8 (GLOVE) ×2 IMPLANT
GLOVE SURG MICRO LTX SZ7 (GLOVE) ×2 IMPLANT
GLOVE SURG MICRO LTX SZ7.5 (GLOVE) ×2 IMPLANT
GOWN STRL REUS W/TWL LRG LVL3 (GOWN DISPOSABLE) ×4 IMPLANT
INSERT HUM REV 39 +6 (Insert) ×2 IMPLANT
INSERT HUMERAL M/39 +3/CNSTRND (Miscellaneous) ×2 IMPLANT
KIT BASIN OR (CUSTOM PROCEDURE TRAY) ×2 IMPLANT
KIT TURNOVER KIT A (KITS) ×2 IMPLANT
MANIFOLD NEPTUNE II (INSTRUMENTS) ×2 IMPLANT
NEEDLE TAPERED W/ NITINOL LOOP (MISCELLANEOUS) IMPLANT
NS IRRIG 1000ML POUR BTL (IV SOLUTION) ×2 IMPLANT
PACK SHOULDER (CUSTOM PROCEDURE TRAY) ×2 IMPLANT
PAD ARMBOARD 7.5X6 YLW CONV (MISCELLANEOUS) ×4 IMPLANT
PAD COLD SHLDR WRAP-ON (PAD) IMPLANT
PROTECTOR NERVE ULNAR (MISCELLANEOUS) ×2 IMPLANT
RESTRAINT HEAD UNIVERSAL NS (MISCELLANEOUS) ×2 IMPLANT
SLING ARM FOAM STRAP LRG (SOFTGOODS) IMPLANT
SLING ARM FOAM STRAP MED (SOFTGOODS) IMPLANT
SLING ARM FOAM STRAP XLG (SOFTGOODS) ×2 IMPLANT
SMARTMIX MINI TOWER (MISCELLANEOUS)
SPACER SHLD UNI REV 39 +9 (Spacer) ×2 IMPLANT
SPONGE T-LAP 18X18 ~~LOC~~+RFID (SPONGE) IMPLANT
SUCTION FRAZIER HANDLE 12FR (TUBING) ×1
SUCTION TUBE FRAZIER 12FR DISP (TUBING) ×1 IMPLANT
SUT MNCRL AB 3-0 PS2 18 (SUTURE) ×2 IMPLANT
SUT MON AB 2-0 CT1 36 (SUTURE) ×2 IMPLANT
SUT VIC AB 1 CT1 36 (SUTURE) ×2 IMPLANT
SUT VIC AB 2-0 CT1 27 (SUTURE) ×2
SUT VIC AB 2-0 CT1 TAPERPNT 27 (SUTURE) ×1 IMPLANT
SUTURE TAPE 1.3 40 TPR END (SUTURE) IMPLANT
SUTURETAPE 1.3 40 TPR END (SUTURE)
SWAB COLLECTION DEVICE MRSA (MISCELLANEOUS) IMPLANT
SWAB CULTURE ESWAB REG 1ML (MISCELLANEOUS) IMPLANT
TOWEL OR 17X26 10 PK STRL BLUE (TOWEL DISPOSABLE) ×2 IMPLANT
TOWEL OR NON WOVEN STRL DISP B (DISPOSABLE) ×2 IMPLANT
TOWER SMARTMIX MINI (MISCELLANEOUS) IMPLANT
WATER STERILE IRR 1000ML POUR (IV SOLUTION) ×2 IMPLANT
YANKAUER SUCT BULB TIP 10FT TU (MISCELLANEOUS) ×2 IMPLANT

## 2020-10-14 NOTE — Anesthesia Procedure Notes (Addendum)
  Anesthesia Regional Block: Interscalene brachial plexus block   Pre-Anesthetic Checklist: , timeout performed,  Correct Patient, Correct Site, Correct Laterality,  Correct Procedure, Correct Position, site marked,  Risks and benefits discussed,  Surgical consent,  Pre-op evaluation,  At surgeon's request and post-op pain management  Laterality: Left  Prep: chloraprep       Needles:  Injection technique: Single-shot  Needle Type: Echogenic Stimulator Needle     Needle Length: 5cm  Needle Gauge: 22     Additional Needles:   Procedures:, nerve stimulator,,, ultrasound used (permanent image in chart),,     Nerve Stimulator or Paresthesia:  Response: hand, 0.45 mA  Additional Responses:   Narrative:  Start time: 10/14/2020 9:29 AM End time: 10/14/2020 9:35 AM Injection made incrementally with aspirations every 5 mL.  Performed by: Personally  Anesthesiologist: Janeece Riggers, MD  Additional Notes: Functioning IV was confirmed and monitors were applied.  A 72mm 22ga Arrow echogenic stimulator needle was used. Sterile prep and drape,hand hygiene and sterile gloves were used. Ultrasound guidance: relevant anatomy identified, needle position confirmed, local anesthetic spread visualized around nerve(s)., vascular puncture avoided.  Image printed for medical record. Negative aspiration and negative test dose prior to incremental administration of local anesthetic. The patient tolerated the procedure well.

## 2020-10-14 NOTE — Discharge Instructions (Signed)
 Kevin M. Supple, M.D., F.A.A.O.S. Orthopaedic Surgery Specializing in Arthroscopic and Reconstructive Surgery of the Shoulder [phone] [address] - [place], [place] [address] - Fax [phone]   POST-OP TOTAL SHOULDER REPLACEMENT INSTRUCTIONS  1. Follow up in the office for your first post-op appointment 10-14 days from the date of your surgery. If you do not already have a scheduled appointment, our office will contact you to schedule.  2. The bandage over your incision is waterproof. You may begin showering with this dressing on. You may leave this dressing on until first follow up appointment within 2 weeks. We prefer you leave this dressing in place until follow up however after 5-7 days if you are having itching or skin irritation and would like to remove it you may do so. Go slow and tug at the borders gently to break the bond the dressing has with the skin. At this point if there is no drainage it is okay to go without a bandage or you may cover it with a light guaze and tape. You can also expect significant bruising around your shoulder that will drift down your arm and into your chest wall. This is very normal and should resolve over several days.   3. Wear your sling/immobilizer at all times except to perform the exercises below or to occasionally let your arm dangle by your side to stretch your elbow. You also need to sleep in your sling immobilizer until instructed otherwise. It is ok to remove your sling if you are sitting in a controlled environment and allow your arm to rest in a position of comfort by your side or on your lap with pillows to give your neck and skin a break from the sling. You may remove it to allow arm to dangle by side to shower. If you are up walking around and when you go to sleep at night you need to wear it.  4. Range of motion to your elbow, wrist, and hand are encouraged 3-5 times daily. Exercise to your hand and fingers helps to reduce  swelling you may experience.   5. Prescriptions for a pain medication and a muscle relaxant are provided for you. It is recommended that if you are experiencing pain that you pain medication alone is not controlling, add the muscle relaxant along with the pain medication which can give additional pain relief. The first 1-2 days is generally the most severe of your pain and then should gradually decrease. As your pain lessens it is recommended that you decrease your use of the pain medications to an "as needed basis'" only and to always comply with the recommended dosages of the pain medications.  6. Pain medications can produce constipation along with their use. If you experience this, the use of an over the counter stool softener or laxative daily is recommended.   7. For additional questions or concerns, please do not hesitate to call the office. If after hours there is an answering service to forward your concerns to the physician on call.  8.Pain control following an exparel block  To help control your post-operative pain you received a nerve block  performed with Exparel which is a long acting anesthetic (numbing agent) which can provide pain relief and sensations of numbness (and relief of pain) in the operative shoulder and arm for up to 3 days. Sometimes it provides mixed relief, meaning you may still have numbness in certain areas of the arm but can still be able to   move  parts of that arm, hand, and fingers. We recommend that your prescribed pain medications  be used as needed. We do not feel it is necessary to "pre medicate" and "stay ahead" of pain.  Taking narcotic pain medications when you are not having any pain can lead to unnecessary and potentially dangerous side effects.    9. Use the ice machine as much as possible in the first 5-7 days from surgery, then you can wean its use to as needed. The ice typically needs to be replaced every 6 hours, instead of ice you can actually freeze  water bottles to put in the cooler and then fill water around them to avoid having to purchase ice. You can have spare water bottles freezing to allow you to rotate them once they have melted. Try to have a thin shirt or light cloth or towel under the ice wrap to protect your skin.   FOR ADDITIONAL INFO ON ICE MACHINE AND INSTRUCTIONS GO TO THE WEBSITE AT  https://www.[employer].com/products/donjoy/donjoy-iceman-[email or web address]  10.  We recommend that you avoid any dental work or cleaning in the first 3 months following your joint replacement. This is to help minimize the possibility of infection from the bacteria in your mouth that enters your bloodstream during dental work. We also recommend that you take an antibiotic prior to your dental work for the first year after your shoulder replacement to further help reduce that risk. Please simply contact our office for antibiotics to be sent to your pharmacy prior to dental work.  11. Dental Antibiotics:  In most cases prophylactic antibiotics for Dental procdeures after total joint surgery are not necessary.  Exceptions are as follows:  1. History of prior total joint infection  2. Severely immunocompromised (Organ Transplant, cancer chemotherapy, Rheumatoid biologic meds such as Humera)  3. Poorly controlled diabetes (A1C &gt; 8.0, blood glucose over 200)  If you have one of these conditions, contact your surgeon for an antibiotic prescription, prior to your dental procedure.   POST-OP EXERCISES  Pendulum Exercises  Perform pendulum exercises while standing and bending at the waist. Support your uninvolved arm on a table or chair and allow your operated arm to hang freely. Make sure to do these exercises passively - not using you shoulder muscles. These exercises can be performed once your nerve block effects have worn off.  Repeat 20 times. Do 3 sessions per day.     

## 2020-10-14 NOTE — H&P (Signed)
[name]    Chief Complaint: unstable left reverse shoulder arthroplasty HPI: The patient is a 60 y.o. male well-known to our practice now approximately 4 months out from his left shoulder reverse arthroplasty.  His recent history is significant for an episode approximately a month ago at which time he was exercising and then as he exerted vigorously with his left shoulder he felt a "tearing" in the anterior aspect of the shoulder.  Subsequent developed severe diffuse swelling and eventually brought ecchymosis over the anterior aspect left shoulder extending into the upper arm.  He felt as though he might of had a "bicep rupture".  He then had an episode when his arm was elevated shoulder level on the back of a chair and he turned away from the left shoulder and had a sensation of a subluxation/dislocation episode that spontaneously reduced.  This was very painful for several hours but then resolved.  Earlier this week he had an episode where the shoulder "popped" with ongoing severe pain and prominence anteriorly in the shoulder and presented to our office where radiographs confirmed an anterior dislocation.  We performed a closed reduction in the office.  Has had several instability episodes and is brought now to the operating for planned revision of his left shoulder reverse arthroplasty.  Past Medical History:  Diagnosis Date   Arrhythmia    Arthritis    shoulders, knees,  back,hips   Atrial fibrillation (HCC)    Avascular necrosis of bone of left hip (HCC)    Back pain    Balance problem    Blood clot in vein 06/2016   x 2 no bleeding disorders found after back surgery   Complication of anesthesia    ketamine hallucinations   Constipation    Coronary artery disease    Dislocated hip, left, initial encounter ([facility]) 10/03/2018   Dysrhythmia 2020   a-fib   Hx of blood clots    Hypertension    Implantable loop recorder present    Joint pain    Neuromuscular disorder (HCC)     neuropathy bi lat legs knee down   Neuropathy    PAF (paroxysmal atrial fibrillation) (HCC)    Pre-diabetes    Prediabetes    Shakes    SOBOE (shortness of breath on exertion)    Swelling of both lower extremities    Viral pericarditis 20 yrs ago    Past Surgical History:  Procedure Laterality Date   ABDOMINAL EXPOSURE N/A 06/23/2016   Procedure: ABDOMINAL EXPOSURE;  Surgeon: Rosetta Posner, MD;  Location: [facility];  Service: Vascular;  Laterality: N/A;   ANTERIOR LATERAL LUMBAR FUSION 4 LEVELS Right 06/23/2016   Procedure: Right  Lumbar two-three Lumbar three-four Lumbar four-five Anterolateral lumbar interbody fusion;  Surgeon: Erline Levine, MD;  Location: [facility];  Service: Neurosurgery;  Laterality: Right;   ANTERIOR LUMBAR FUSION N/A 06/23/2016   Procedure: Lumbar five -sacral one  Anterior lumbar interbody fusion with Dr. Sherren Mocha Early for approach;  Surgeon: Erline Levine, MD;  Location: [facility];  Service: Neurosurgery;  Laterality: N/A;   APPLICATION OF INTRAOPERATIVE CT SCAN N/A 06/26/2016   Procedure: APPLICATION OF INTRAOPERATIVE CT SCAN;  Surgeon: Erline Levine, MD;  Location: [name];  Service: Neurosurgery;  Laterality: N/A;   BACK SURGERY  2018   CARDIAC CATHETERIZATION     CARDIOVASCULAR STRESS TEST     Negative in May 2014   COLONOSCOPY     HERNIA REPAIR     umbilical  HIP CLOSED REDUCTION Left 10/03/2018   Procedure: CLOSED REDUCTION HIP;  Surgeon: Rod Can, MD;  Location: [facility];  Service: Orthopedics;  Laterality: Left;   IR IVC FILTER PLMT / S&I /IMG GUID/MOD SED  03/26/2018   IR RADIOLOGIST EVAL & MGMT  04/16/2019   JOINT REPLACEMENT     LEFT HEART CATH AND CORONARY ANGIOGRAPHY N/A 06/21/2018   Procedure: LEFT HEART CATH AND CORONARY ANGIOGRAPHY;  Surgeon: Jettie Booze, MD;  Location: [facility];  Service: Cardiovascular;  Laterality: N/A;   POSTERIOR LUMBAR FUSION 4 LEVEL N/A 06/26/2016   Procedure: Thoracic ten to Pelvis fixation with AIRO;  Surgeon:  Erline Levine, MD;  Location: [facility];  Service: Neurosurgery;  Laterality: N/A;   REVERSE SHOULDER ARTHROPLASTY Right 09/18/2019   Procedure: REVERSE SHOULDER ARTHROPLASTY;  Surgeon: Justice Britain, MD;  Location: [facility];  Service: Orthopedics;  Laterality: Right;  176min   REVERSE SHOULDER ARTHROPLASTY Left 06/10/2020   Procedure: REVERSE SHOULDER ARTHROPLASTY;  Surgeon: Justice Britain, MD;  Location: [facility];  Service: Orthopedics;  Laterality: Left;  122min   TENDON REPAIR     right hand   TOTAL HIP ARTHROPLASTY Right 05/25/2014   Procedure: RIGHT TOTAL HIP ARTHROPLASTY ANTERIOR APPROACH;  Surgeon: Rod Can, MD;  Location: [facility];  Service: Orthopedics;  Laterality: Right;   TOTAL HIP ARTHROPLASTY Left 03/27/2018   Procedure: TOTAL HIP ARTHROPLASTY ANTERIOR APPROACH- DA complex;  Surgeon: Rod Can, MD;  Location: [facility];  Service: Orthopedics;  Laterality: Left;   TOTAL KNEE ARTHROPLASTY Bilateral     Family History  Adopted: Yes  Problem Relation Age of Onset   Healthy Daughter    Healthy Daughter     Social History:  reports that he quit smoking about 22 years ago. His smoking use included cigarettes. He has a 25.00 pack-year smoking history. His smokeless tobacco use includes snuff. He reports current alcohol use of about 14.0 standard drinks per week. He reports that he does not use drugs.   Medications Prior to Admission  Medication Sig Dispense Refill   acetaminophen (TYLENOL) 500 MG tablet Take 1,000 mg by mouth every 6 (six) hours as needed for mild pain or headache.     apixaban (ELIQUIS) 5 MG TABS tablet TAKE 1 TABLET BY MOUTH TWICE DAILY 180 tablet 2   atorvastatin (LIPITOR) 80 MG tablet TAKE 1 TABLET BY MOUTH DAILY AT 6PM (Patient taking differently: Take 80 mg by mouth daily.) 90 tablet 3   Cholecalciferol (VITAMIN D3) 1.25 MG (50000 UT) CAPS Take 50,000 Units by mouth once a week.     diltiazem (CARDIZEM CD) 120 MG 24 hr capsule TAKE 1 CAPSULE (120 MG TOTAL) BY MOUTH  DAILY. 90 capsule 3   docusate sodium (COLACE) 100 MG capsule Take 1 capsule (100 mg total) by mouth 2 (two) times daily. 60 capsule 1   dronedarone (MULTAQ) 400 MG tablet TAKE 1 TABLET BY MOUTH TWICE DAILY WITH A MEAL 180 tablet 3   DULoxetine (CYMBALTA) 60 MG capsule TAKE 1 CAPSULE (60 MG TOTAL) BY MOUTH AT BEDTIME. 90 capsule 3   lisinopril-hydrochlorothiazide (ZESTORETIC) 20-25 MG tablet TAKE 1 TABLET BY MOUTH DAILY. 90 tablet 3   metoprolol succinate (TOPROL-XL) 50 MG 24 hr tablet Take 1 tablet (50 mg total) by mouth daily. Take with or immediately following a meal. 90 tablet 3   nitroGLYCERIN (NITROSTAT) 0.4 MG SL tablet Place 1 tablet (0.4 mg total) under the tongue every 5 (five) minutes x 3 doses as  needed for chest pain. If taking third dose, call 911. 25 tablet 3   Semaglutide,0.25 or 0.5MG /DOS, (OZEMPIC, 0.25 OR 0.5 MG/DOSE,) 2 MG/1.5ML SOPN Inject 0.25 mg into the skin once a week. 1.5 mL 0   zolpidem (AMBIEN) 10 MG tablet TAKE 1 TABLET BY MOUTH EVERY NIGHT AT BEDTIME AS NEEDED FOR SLEEP (Patient taking differently: Take 10 mg by mouth at bedtime.) 90 tablet 1   sildenafil (VIAGRA) 100 MG tablet Take 1 tablet (100 mg total) by mouth daily as needed. Avoid use with nitroglycerin 30 tablet 11     Physical Exam: Inspection left shoulder demonstrates his surgical incisions well-healed.  He is intact to light touch sensation in the axillary nerve distribution and he is neurovascular intact distally in the left upper extremity.  Minimal tenderness.  Radiographs from our office earlier this week show an anterior dislocation and subsequent concentric closed reduction.  Vitals  Temp:  [98.6 F (37 C)] 98.6 F (37 C) (10/13 0827) Pulse Rate:  [56-67] 57 (10/13 0935) Resp:  [11-18] 12 (10/13 0935) BP: (90-155)/(65-99) 129/98 (10/13 0935) SpO2:  [98 %-100 %] 100 % (10/13 0935) Weight:  [116.1 kg] 116.1 kg (10/13 0843)  Assessment/Plan  Impression: unstable left reverse shoulder  arthroplasty  Plan of Action: Procedure(s): Revision Left reverse shoulder arthroplasty  Ahmon Tosi M Searra Carnathan 10/14/2020, 9:46 AM Contact # [phone]

## 2020-10-14 NOTE — Op Note (Signed)
10/14/2020  11:36 AM  PATIENT:   Travis Huff  60 y.o. male  PRE-OPERATIVE DIAGNOSIS:  unstable left reverse shoulder arthroplasty  POST-OPERATIVE DIAGNOSIS: Same  PROCEDURE: Revision of left shoulder unstable reverse arthroplasty with the addition of a +6 spacer and a +6 constrained polyethylene insert upon the previously placed humeral stem and metaphysis with the removal of the previous +6 polyethylene insert  SURGEON:  Mckinze Poirier, Metta Clines M.D.  ASSISTANTS: Jenetta Loges, PA-C  ANESTHESIA:   General endotracheal and interscalene block with Exparel  EBL: Less than 100 cc  SPECIMEN: None  Drains: None   PATIENT DISPOSITION:  PACU - hemodynamically stable.    PLAN OF CARE: Discharge to home after PACU Brief history:  Travis Huff is a 60 year old gentleman status post a left shoulder reverse arthroplasty that I performed approximately 4 months ago.  He initially did well but then approximately 1 month ago was exercising vigorously and he felt a "tearing" in the anterior aspect of the left shoulder with subsequent severe pain as well as swelling and broad areas of bruising about the shoulder and upper arm.  Following this he had an episode when his left arm was elevated to 90 degrees on the top of a chair and he turned away from his left arm and had a subluxation/dislocation episode which then spontaneously reduced.  He had severe pain for several hours after this episode.  More recently he was at home this past weekend when he reach with the left arm and had immediate pain with a sensation of "popping" and subsequent noted prominence in the anterior aspect left shoulder.  He was seen in the office Monday where he was found to have an anterior dislocation which we closed reduced.  He has had now multiple instability episodes and is brought to the operating this time for planned revision procedure to correct the instability.  Preoperatively, I counseled the patient regarding treatment  options and risks versus benefits thereof.  Possible surgical complications were all reviewed including potential for bleeding, infection, neurovascular injury, persistent pain, loss of motion, anesthetic complication, failure of the implant, and possible need for additional surgery. They understand and accept and agrees with our planned procedure.   Procedure detail:  After undergoing routine preop evaluation the patient received prophylactic antibiotics and interscalene block with Exparel was established in the holding area by the anesthesia department.  Patient was subsequently placed supine on the operating table and underwent the smooth induction of a general endotracheal anesthesia.  Subsequently moved in the beachchair position appropriately padded and protected.  The left shoulder girdle region was sterilely prepped and draped in standard fashion.  Timeout was called.  Approach left shoulder is made through his previous deltopectoral incision approximately 10 cm in length.  Skin flaps were elevated dissection carried deeply and the deltopectoral interval was then carefully identified dissected free with the vein taken laterally and the interval was developed from proximal to distal.  Adhesions along the deltopectoral interval were carefully divided with combination of blunt and sharp dissection and electrocautery.  We then took the dissection down to the level of the implant which showed that the subscapularis had avulsed and was retracted medially.  The conjoined tendon was identified and protected and retracted medially and his adhesions were then divided from beneath the deltoid.  Once the exposure was completed the implant easily dislocated.  We gained circumferential exposure around the implant proximally and were then able to remove the previously placed polyethylene insert.  This  time alignment guide was then fastened to the metaphysis of the humeral stem and then we applied some torquing forces  to assess the stability of the humeral stem and it was found to be securely implanted.  We then evaluated the glenosphere placing the screwdriver into the locking screw of the glenosphere and torqued the glenosphere and this also showed excellent fixation.  With these findings we then proceeded to perform a series of productions to assess stability and ultimately felt that a total of 12 off the implant gave [place] the best soft tissue balance and so we applied a +6 spacer and then a +6 constrained poly-.  A final reduction was then performed which showed excellent motion good stability good soft tissue balance.  The wound was then copiously irrigated.  Final hemostasis was obtained.  Vancomycin powder was then sprayed liberally throughout the deep soft tissue planes.  The deltopectoral interval was reapproximated with a series of figure-of-eight #1 Vicryl sutures.  2-0 Monocryl used to close the subcu layer and intracuticular 3-0 Monocryl for the skin followed by Dermabond and Aquacel dressing.  Left arm was then placed into a sling.  The patient was awakened, extubated, and taken to the recovery room in stable condition.  Jenetta Loges, PA-C was utilized as an Environmental consultant throughout this case, essential for help with positioning the patient, positioning extremity, tissue manipulation, implantation of the prosthesis, suture management, wound closure, and intraoperative decision-making.  Marin Shutter MD   Contact # [phone]

## 2020-10-14 NOTE — Anesthesia Postprocedure Evaluation (Signed)
Anesthesia Post Note  Patient: Travis Huff  Procedure(s) Performed: Revision Left reverse shoulder arthroplasty (Left: Shoulder)     Patient location during evaluation: PACU Anesthesia Type: General Level of consciousness: awake and alert Pain management: pain level controlled Vital Signs Assessment: post-procedure vital signs reviewed and stable Respiratory status: spontaneous breathing, nonlabored ventilation, respiratory function stable and patient connected to nasal cannula oxygen Cardiovascular status: blood pressure returned to baseline and stable Postop Assessment: no apparent nausea or vomiting Anesthetic complications: no   No notable events documented.  Last Vitals:  Vitals:   10/14/20 1215 10/14/20 1235  BP: 109/86 126/74  Pulse: 71 75  Resp: 13 18  Temp: 36.8 C   SpO2: 96% 93%    Last Pain:  Vitals:   10/14/20 1215  TempSrc:   PainSc: 0-No pain                 Calieb Lichtman

## 2020-10-14 NOTE — Transfer of Care (Signed)
Immediate Anesthesia Transfer of Care Note  Patient: Travis Huff  Procedure(s) Performed: Revision Left reverse shoulder arthroplasty (Left: Shoulder)  Patient Location: PACU  Anesthesia Type:General  Level of Consciousness: awake, alert  and patient cooperative  Airway & Oxygen Therapy: Patient Spontanous Breathing and Patient connected to face mask oxygen  Post-op Assessment: Report given to RN and Post -op Vital signs reviewed and stable  Post vital signs: Reviewed and stable  Last Vitals:  Vitals Value Taken Time  BP 122/78 10/14/20 1156  Temp    Pulse 80 10/14/20 1158  Resp 12 10/14/20 1158  SpO2 93 % 10/14/20 1158  Vitals shown include unvalidated device data.  Last Pain:  Vitals:   10/14/20 0945  TempSrc:   PainSc: 0-No pain         Complications: No notable events documented.

## 2020-10-14 NOTE — Progress Notes (Signed)
[facility] Summary Report / Loop Recorder 

## 2020-10-14 NOTE — Progress Notes (Signed)
Assisted Dr. Ambrose Pancoast with left, ultrasound guided, interscalene  block. Side rails up, monitors on throughout procedure. See vital signs in flow sheet. Tolerated Procedure well.

## 2020-10-14 NOTE — Anesthesia Procedure Notes (Signed)
Procedure Name: Intubation Date/Time: 10/14/2020 10:29 AM Performed by: Eben Burow, CRNA Pre-anesthesia Checklist: Patient identified, Emergency Drugs available, Suction available, Patient being monitored and Timeout performed Patient Re-evaluated:Patient Re-evaluated prior to induction Oxygen Delivery Method: Circle system utilized Preoxygenation: Pre-oxygenation with 100% oxygen Induction Type: IV induction Ventilation: Mask ventilation without difficulty Laryngoscope Size: Mac and 4 Grade View: Grade I Tube type: Oral Tube size: 7.5 mm Number of attempts: 1 Airway Equipment and Method: Stylet Placement Confirmation: ETT inserted through vocal cords under direct vision, positive ETCO2 and breath sounds checked- equal and bilateral Secured at: 23 cm Tube secured with: Tape Dental Injury: Teeth and Oropharynx as per pre-operative assessment

## 2020-10-16 ENCOUNTER — Encounter (HOSPITAL_COMMUNITY): Payer: Self-pay | Admitting: Orthopedic Surgery

## 2020-10-16 DIAGNOSIS — M25512 Pain in left shoulder: Secondary | ICD-10-CM | POA: Diagnosis not present

## 2020-10-18 DIAGNOSIS — Z4789 Encounter for other orthopedic aftercare: Secondary | ICD-10-CM | POA: Diagnosis not present

## 2020-10-25 ENCOUNTER — Ambulatory Visit (INDEPENDENT_AMBULATORY_CARE_PROVIDER_SITE_OTHER): Payer: 59 | Admitting: Family Medicine

## 2020-10-25 ENCOUNTER — Other Ambulatory Visit (HOSPITAL_BASED_OUTPATIENT_CLINIC_OR_DEPARTMENT_OTHER): Payer: Self-pay

## 2020-10-28 ENCOUNTER — Other Ambulatory Visit (HOSPITAL_BASED_OUTPATIENT_CLINIC_OR_DEPARTMENT_OTHER): Payer: Self-pay

## 2020-10-28 NOTE — Telephone Encounter (Signed)
Left patient vm asking him to call back so I can update in about claim for 07/27/2020.  I will leave updates in account notes.

## 2020-10-31 MED FILL — Lisinopril & Hydrochlorothiazide Tab 20-25 MG: ORAL | 90 days supply | Qty: 90 | Fill #2 | Status: AC

## 2020-10-31 MED FILL — Diltiazem HCl Coated Beads Cap ER 24HR 120 MG: ORAL | 90 days supply | Qty: 90 | Fill #2 | Status: AC

## 2020-11-01 ENCOUNTER — Other Ambulatory Visit: Payer: Self-pay

## 2020-11-01 ENCOUNTER — Encounter (INDEPENDENT_AMBULATORY_CARE_PROVIDER_SITE_OTHER): Payer: Self-pay | Admitting: Family Medicine

## 2020-11-01 ENCOUNTER — Ambulatory Visit (INDEPENDENT_AMBULATORY_CARE_PROVIDER_SITE_OTHER): Payer: 59 | Admitting: Family Medicine

## 2020-11-01 ENCOUNTER — Other Ambulatory Visit (HOSPITAL_BASED_OUTPATIENT_CLINIC_OR_DEPARTMENT_OTHER): Payer: Self-pay

## 2020-11-01 VITALS — BP 127/82 | HR 70 | Temp 98.1°F | Ht 71.0 in | Wt 250.0 lb

## 2020-11-01 DIAGNOSIS — Z6841 Body Mass Index (BMI) 40.0 and over, adult: Secondary | ICD-10-CM

## 2020-11-01 DIAGNOSIS — R7303 Prediabetes: Secondary | ICD-10-CM

## 2020-11-01 DIAGNOSIS — Z9189 Other specified personal risk factors, not elsewhere classified: Secondary | ICD-10-CM | POA: Diagnosis not present

## 2020-11-01 MED ORDER — OZEMPIC (0.25 OR 0.5 MG/DOSE) 2 MG/1.5ML ~~LOC~~ SOPN
0.5000 mg | PEN_INJECTOR | SUBCUTANEOUS | 0 refills | Status: DC
Start: 2020-11-01 — End: 2020-12-13
  Filled 2020-11-01: qty 1.5, 28d supply, fill #0

## 2020-11-01 NOTE — Progress Notes (Signed)
Chief Complaint:   OBESITY Travis Huff is here to discuss his progress with his obesity treatment plan along with follow-up of his obesity related diagnoses. Travis Huff is on the Category 4 Plan and states he is following his eating plan approximately 85% of the time. Travis Huff states he is doing 0 minutes 0 times per week.  Today's visit was #: 15 Starting weight: 350 lbs Starting date: 02/03/2020 Today's weight: 250 lbs Today's date: 11/01/2020 Total lbs lost to date: 100 Total lbs lost since last in-office visit: 11  Interim History: Travis Huff had left shoulder surgery 2 weeks ago, and he is doing well. Today marks his 100 lb weight loss since starting our program. He is drinking 6 bottles of water per day.   Subjective:   1. Pre-diabetes Dragon started Ozempic on 10/01/2020, and he has been on it for 1 month. He is tolerating it well with no side effects and he notes it is working well to decrease his hunger and interest in food.  2. At risk for constipation Travis Huff is at increased risk for constipation due to increased dose of Ozempic.  Assessment/Plan:  No orders of the defined types were placed in this encounter.   Medications Discontinued During This Encounter  Medication Reason   nitroGLYCERIN (NITROSTAT) 0.4 MG SL tablet Error   ondansetron (ZOFRAN) 4 MG tablet Error   Semaglutide,0.25 or 0.5MG /DOS, (OZEMPIC, 0.25 OR 0.5 MG/DOSE,) 2 MG/1.5ML SOPN Reorder     Meds ordered this encounter  Medications   Semaglutide,0.25 or 0.5MG /DOS, (OZEMPIC, 0.25 OR 0.5 MG/DOSE,) 2 MG/1.5ML SOPN    Sig: Inject 0.5 mg into the skin once a week.    Dispense:  1.5 mL    Refill:  0    30 d supply;  ** OV for RF **   Do not send RF request     1. Pre-diabetes Travis Huff desires to increase his Ozempic dose to 0.5 mg weekly, and we will refill for 1 month. He will continue to work on weight loss, exercise, and decreasing simple carbohydrates to help decrease the risk of diabetes.   - Semaglutide,0.25 or  0.5MG /DOS, (OZEMPIC, 0.25 OR 0.5 MG/DOSE,) 2 MG/1.5ML SOPN; Inject 0.5 mg into the skin once a week.  Dispense: 1.5 mL; Refill: 0  2. At risk for constipation Travis Huff was given approximately 15 minutes of counseling today regarding prevention of constipation. He was encouraged to increase water and fiber intake.   3. Obesity BMI today is 25 Travis Huff is currently in the action stage of change. As such, his goal is to continue with weight loss efforts. He has agreed to the Category 4 Plan.   We will consider decreasing his Category in the future to Category 3. No change today due to a his huge success.  Exercise goals: He will start walking tomorrow for 30 minutes 4 days per week, As per Ortho.  Behavioral modification strategies: increasing lean protein intake, decreasing simple carbohydrates, and planning for success.  Travis Huff has agreed to follow-up with our clinic in 3 weeks. He was informed of the importance of frequent follow-up visits to maximize his success with intensive lifestyle modifications for his multiple health conditions.   Objective:   Blood pressure 127/82, pulse 70, temperature 98.1 F (36.7 C), height 5\' 11"  (1.803 m), weight 250 lb (113.4 kg), SpO2 96 %. Body mass index is 34.87 kg/m.  General: Cooperative, alert, well developed, in no acute distress. HEENT: Conjunctivae and lids unremarkable. Cardiovascular: Regular rhythm.  Lungs: Normal  work of breathing. Neurologic: No focal deficits.   Lab Results  Component Value Date   CREATININE 0.93 10/13/2020   BUN 24 (H) 10/13/2020   NA 141 10/13/2020   K 4.2 10/13/2020   CL 104 10/13/2020   CO2 28 10/13/2020   Lab Results  Component Value Date   ALT 34 02/03/2020   AST 39 02/03/2020   ALKPHOS 149 (H) 02/03/2020   BILITOT 0.3 02/03/2020   Lab Results  Component Value Date   HGBA1C 5.5 09/16/2020   HGBA1C 5.8 (H) 05/28/2020   HGBA1C 6.0 (H) 02/03/2020   HGBA1C 5.6 06/19/2018   HGBA1C 5.9 (H) 04/17/2017   Lab  Results  Component Value Date   INSULIN 17.2 02/03/2020   Lab Results  Component Value Date   TSH 2.110 02/03/2020   Lab Results  Component Value Date   CHOL 170 02/03/2020   HDL 73 02/03/2020   LDLCALC 82 02/03/2020   TRIG 83 02/03/2020   CHOLHDL 2.3 02/03/2020   Lab Results  Component Value Date   VD25OH 60.9 08/30/2020   VD25OH 26.1 (L) 02/03/2020   Lab Results  Component Value Date   WBC 8.9 10/13/2020   HGB 16.2 10/13/2020   HCT 48.2 10/13/2020   MCV 95.6 10/13/2020   PLT 226 10/13/2020   No results found for: IRON, TIBC, FERRITIN  Attestation Statements:   Reviewed by clinician on day of visit: allergies, medications, problem list, medical history, surgical history, family history, social history, and previous encounter notes.   Wilhemena Durie, am acting as transcriptionist for [employer], DO.  I have reviewed the above documentation for accuracy and completeness, and I agree with the above. Marjory Sneddon, D.O.  The [place] was signed into law in 2016 which includes the topic of electronic health records.  This provides immediate access to information in MyChart.  This includes consultation notes, operative notes, office notes, lab results and pathology reports.  If you have any questions about what you read please let us know at your next visit so we can discuss your concerns and take corrective action if need be.  We are right here with you.

## 2020-11-03 ENCOUNTER — Other Ambulatory Visit (HOSPITAL_BASED_OUTPATIENT_CLINIC_OR_DEPARTMENT_OTHER): Payer: Self-pay

## 2020-11-03 MED ORDER — CYCLOBENZAPRINE HCL 10 MG PO TABS
ORAL_TABLET | ORAL | 0 refills | Status: DC
  Filled 2020-11-03: qty 30, 10d supply, fill #0

## 2020-11-08 ENCOUNTER — Ambulatory Visit (INDEPENDENT_AMBULATORY_CARE_PROVIDER_SITE_OTHER): Payer: 59

## 2020-11-08 DIAGNOSIS — I255 Ischemic cardiomyopathy: Secondary | ICD-10-CM

## 2020-11-09 LAB — CUP PACEART REMOTE DEVICE CHECK
Date Time Interrogation Session: 20221108084227
Implantable Pulse Generator Implant Date: 20220727

## 2020-11-16 NOTE — Progress Notes (Signed)
[facility] Summary Report / Loop Recorder 

## 2020-11-19 ENCOUNTER — Other Ambulatory Visit (HOSPITAL_BASED_OUTPATIENT_CLINIC_OR_DEPARTMENT_OTHER): Payer: Self-pay

## 2020-11-19 MED ORDER — INFLUENZA VAC SPLIT QUAD 0.5 ML IM SUSY
PREFILLED_SYRINGE | INTRAMUSCULAR | 0 refills | Status: DC
  Filled 2020-11-19: qty 0.5, 1d supply, fill #0

## 2020-11-22 ENCOUNTER — Ambulatory Visit (INDEPENDENT_AMBULATORY_CARE_PROVIDER_SITE_OTHER): Payer: 59 | Admitting: Family Medicine

## 2020-11-22 ENCOUNTER — Encounter (INDEPENDENT_AMBULATORY_CARE_PROVIDER_SITE_OTHER): Payer: Self-pay | Admitting: Family Medicine

## 2020-11-22 ENCOUNTER — Other Ambulatory Visit: Payer: Self-pay

## 2020-11-22 VITALS — BP 126/86 | HR 77 | Temp 98.7°F | Ht 71.0 in | Wt 248.0 lb

## 2020-11-22 DIAGNOSIS — K5909 Other constipation: Secondary | ICD-10-CM | POA: Diagnosis not present

## 2020-11-22 DIAGNOSIS — Z9189 Other specified personal risk factors, not elsewhere classified: Secondary | ICD-10-CM

## 2020-11-22 DIAGNOSIS — R7303 Prediabetes: Secondary | ICD-10-CM | POA: Diagnosis not present

## 2020-11-22 DIAGNOSIS — Z6841 Body Mass Index (BMI) 40.0 and over, adult: Secondary | ICD-10-CM

## 2020-11-22 NOTE — Progress Notes (Signed)
Chief Complaint:   OBESITY Travis Huff is here to discuss his progress with his obesity treatment plan along with follow-up of his obesity related diagnoses. Travis Huff is on the Category 4 Plan and states he is following his eating plan approximately 85% of the time. Travis Huff states he is not currently exercising.  Today's visit was #: 16 Starting weight: 350 lbs Starting date: 02/03/2020 Today's weight: 248 lbs Today's date: 11/22/2020 Total lbs lost to date: 102 Total lbs lost since last in-office visit: 2  Interim History: Travis Huff has no issues or concerns with meal plan. Travis Huff is here for a follow up office visit.  We reviewed his meal plan and questions were answered.  Patient's food recall appears to be accurate and consistent with what is on plan when he is following it.   When eating on plan, his hunger and cravings are well controlled.    Subjective:   1. Pre-diabetes Kayman increased Ozempic to 0.5 mg last week and denies GI distress and able to eat all of his food.  2. Other constipation Kodah takes Dulcolax and Miralax prn. Symptoms under control thus far.  3. At risk for dehydration Sanchez is at risk for dehydration due to inadequate water intake.  Assessment/Plan:  No orders of the defined types were placed in this encounter.   Medications Discontinued During This Encounter  Medication Reason   cyclobenzaprine (FLEXERIL) 10 MG tablet Duplicate   influenza vac split quadrivalent PF (FLUARIX) 0.5 ML injection    oxyCODONE-acetaminophen (PERCOCET) 5-325 MG tablet      No orders of the defined types were placed in this encounter.    1. Pre-diabetes Travis Huff will continue to work on weight loss, exercise, and decreasing simple carbohydrates to help decrease the risk of diabetes. Continue Ozempic 0.5 mg dose.  2. Other constipation Continue supportive medications but increase water intake. Travis Huff was informed that a decrease in bowel movement frequency is normal while  losing weight, but stools should not be hard or painful. Orders and follow up as documented in patient record.   Counseling Getting to Good Bowel Health: Your goal is to have one soft bowel movement each day. Drink at least 8 glasses of water each day. Eat plenty of fiber (goal is over 25 grams each day). It is best to get most of your fiber from dietary sources which includes leafy green vegetables, fresh fruit, and whole grains. You may need to add fiber with the help of OTC fiber supplements. These include Metamucil, Citrucel, and Flaxseed. If you are still having trouble, try adding Miralax or Magnesium Citrate. If all of these changes do not work, Cabin crew.  3. At risk for dehydration Travis Huff was given approximately 9 minutes dehydration prevention counseling today. Travis Huff is at risk for dehydration due to weight loss and current medication(s). He was encouraged to hydrate and monitor fluid status to avoid dehydration as well as weight loss plateaus.   4. Obesity BMI today is 45  Travis Huff is currently in the action stage of change. As such, his goal is to continue with weight loss efforts. He has agreed to the Category 4 Plan.   Exercise goals:  As is  Behavioral modification strategies: increasing lean protein intake, decreasing simple carbohydrates, and planning for success.  Travis Huff has agreed to follow-up with our clinic in 2-3 weeks. He was informed of the importance of frequent follow-up visits to maximize his success with intensive lifestyle modifications for his multiple health conditions.  Objective:   Blood pressure 126/86, pulse 77, temperature 98.7 F (37.1 C), height 5\' 11"  (1.803 m), weight 248 lb (112.5 kg), SpO2 97 %. Body mass index is 34.59 kg/m.  General: Cooperative, alert, well developed, in no acute distress. HEENT: Conjunctivae and lids unremarkable. Cardiovascular: Regular rhythm.  Lungs: Normal work of breathing. Neurologic: No focal deficits.   Lab  Results  Component Value Date   CREATININE 0.93 10/13/2020   BUN 24 (H) 10/13/2020   NA 141 10/13/2020   K 4.2 10/13/2020   CL 104 10/13/2020   CO2 28 10/13/2020   Lab Results  Component Value Date   ALT 34 02/03/2020   AST 39 02/03/2020   ALKPHOS 149 (H) 02/03/2020   BILITOT 0.3 02/03/2020   Lab Results  Component Value Date   HGBA1C 5.5 09/16/2020   HGBA1C 5.8 (H) 05/28/2020   HGBA1C 6.0 (H) 02/03/2020   HGBA1C 5.6 06/19/2018   HGBA1C 5.9 (H) 04/17/2017   Lab Results  Component Value Date   INSULIN 17.2 02/03/2020   Lab Results  Component Value Date   TSH 2.110 02/03/2020   Lab Results  Component Value Date   CHOL 170 02/03/2020   HDL 73 02/03/2020   LDLCALC 82 02/03/2020   TRIG 83 02/03/2020   CHOLHDL 2.3 02/03/2020   Lab Results  Component Value Date   VD25OH 60.9 08/30/2020   VD25OH 26.1 (L) 02/03/2020   Lab Results  Component Value Date   WBC 8.9 10/13/2020   HGB 16.2 10/13/2020   HCT 48.2 10/13/2020   MCV 95.6 10/13/2020   PLT 226 10/13/2020    Attestation Statements:   Reviewed by clinician on day of visit: allergies, medications, problem list, medical history, surgical history, family history, social history, and previous encounter notes.  Coral Ceo, CMA, am acting as transcriptionist for [employer], DO.  I have reviewed the above documentation for accuracy and completeness, and I agree with the above. Marjory Sneddon, D.O.  The [place] was signed into law in 2016 which includes the topic of electronic health records.  This provides immediate access to information in MyChart.  This includes consultation notes, operative notes, office notes, lab results and pathology reports.  If you have any questions about what you read please let us know at your next visit so we can discuss your concerns and take corrective action if need be.  We are right here with you.

## 2020-12-01 DIAGNOSIS — Z471 Aftercare following joint replacement surgery: Secondary | ICD-10-CM | POA: Diagnosis not present

## 2020-12-01 DIAGNOSIS — Z96612 Presence of left artificial shoulder joint: Secondary | ICD-10-CM | POA: Diagnosis not present

## 2020-12-02 ENCOUNTER — Other Ambulatory Visit (HOSPITAL_COMMUNITY): Payer: Self-pay | Admitting: Cardiology

## 2020-12-03 ENCOUNTER — Other Ambulatory Visit (HOSPITAL_BASED_OUTPATIENT_CLINIC_OR_DEPARTMENT_OTHER): Payer: Self-pay

## 2020-12-03 MED ORDER — APIXABAN 5 MG PO TABS
ORAL_TABLET | Freq: Two times a day (BID) | ORAL | 1 refills | Status: DC
  Filled 2020-12-03: qty 180, 90d supply, fill #0
  Filled 2021-03-15: qty 180, 90d supply, fill #1

## 2020-12-03 NOTE — Telephone Encounter (Signed)
Prescription refill request for Eliquis received. Indication: afib  Last office visit: [place], 07/27/2020 Scr: 0.94, 05/28/2020 Age: 60 yo  Weight: 112.5 kg   Refill sent.

## 2020-12-13 ENCOUNTER — Other Ambulatory Visit: Payer: Self-pay

## 2020-12-13 ENCOUNTER — Ambulatory Visit (INDEPENDENT_AMBULATORY_CARE_PROVIDER_SITE_OTHER): Payer: 59

## 2020-12-13 ENCOUNTER — Encounter (INDEPENDENT_AMBULATORY_CARE_PROVIDER_SITE_OTHER): Payer: Self-pay | Admitting: Family Medicine

## 2020-12-13 ENCOUNTER — Ambulatory Visit (INDEPENDENT_AMBULATORY_CARE_PROVIDER_SITE_OTHER): Payer: 59 | Admitting: Family Medicine

## 2020-12-13 ENCOUNTER — Other Ambulatory Visit (HOSPITAL_BASED_OUTPATIENT_CLINIC_OR_DEPARTMENT_OTHER): Payer: Self-pay

## 2020-12-13 VITALS — BP 139/84 | HR 68 | Temp 98.6°F | Ht 71.0 in | Wt 244.0 lb

## 2020-12-13 DIAGNOSIS — I255 Ischemic cardiomyopathy: Secondary | ICD-10-CM | POA: Diagnosis not present

## 2020-12-13 DIAGNOSIS — Z9189 Other specified personal risk factors, not elsewhere classified: Secondary | ICD-10-CM

## 2020-12-13 DIAGNOSIS — R7303 Prediabetes: Secondary | ICD-10-CM | POA: Diagnosis not present

## 2020-12-13 DIAGNOSIS — K5909 Other constipation: Secondary | ICD-10-CM | POA: Diagnosis not present

## 2020-12-13 DIAGNOSIS — E559 Vitamin D deficiency, unspecified: Secondary | ICD-10-CM | POA: Diagnosis not present

## 2020-12-13 DIAGNOSIS — Z6841 Body Mass Index (BMI) 40.0 and over, adult: Secondary | ICD-10-CM

## 2020-12-13 MED ORDER — OZEMPIC (0.25 OR 0.5 MG/DOSE) 2 MG/1.5ML ~~LOC~~ SOPN
0.5000 mg | PEN_INJECTOR | SUBCUTANEOUS | 0 refills | Status: DC
Start: 2020-12-13 — End: 2021-01-10
  Filled 2020-12-13: qty 1.5, 28d supply, fill #0

## 2020-12-14 LAB — CUP PACEART REMOTE DEVICE CHECK
Date Time Interrogation Session: 20221211083811
Implantable Pulse Generator Implant Date: 20220727

## 2020-12-14 NOTE — Progress Notes (Signed)
Chief Complaint:   OBESITY Travis Huff is here to discuss his progress with his obesity treatment plan along with follow-up of his obesity related diagnoses. Travis Huff is on the Category 4 Plan and states he is following his eating plan approximately 85-90% of the time. Travis Huff states he is not currently exercising.  Today's visit was #: 65 Starting weight: 350 lbs Starting date: 02/03/2020 Today's weight: 244 lbs Today's date: 12/13/2020 Total lbs lost to date: 106 Total lbs lost since last in-office visit: 4  Interim History: Travis Huff used the strategies that we discussed at his last office visit over the holiday. He has no issues with the meal plan, or concerns with his medications/treatment plans. He states his personal goal is to be under 200 lbs.  Subjective:   1. Pre-diabetes Travis Huff denies cravings or hunger, but he is eating all of the food on the plan. He declines the need for change in his medication dose.  2. Vitamin D deficiency Travis Huff has no issues and he declines the need for a refill at this time. His energy level has been great.  3. Other constipation Travis Huff is taking Colace as needed, and he notes constipation is no longer an issues for him.  4. At risk for activity intolerance Travis Huff is at risk for exercise intolerance.  Assessment/Plan:  No orders of the defined types were placed in this encounter.   Medications Discontinued During This Encounter  Medication Reason   Semaglutide,0.25 or 0.5MG /DOS, (OZEMPIC, 0.25 OR 0.5 MG/DOSE,) 2 MG/1.5ML SOPN Reorder     Meds ordered this encounter  Medications   Semaglutide,0.25 or 0.5MG /DOS, (OZEMPIC, 0.25 OR 0.5 MG/DOSE,) 2 MG/1.5ML SOPN    Sig: Inject 0.5 mg into the skin once a week.    Dispense:  1.5 mL    Refill:  0    30 d supply;  ** OV for RF **   Do not send RF request     1. Pre-diabetes We will refill Ozempic at 0.5 mg weekly (same dose) for 1 month. Travis Huff will continue his prudent nutritional plan, and we will  consider rechecking his A1c at his next office visit.    - Semaglutide,0.25 or 0.5MG /DOS, (OZEMPIC, 0.25 OR 0.5 MG/DOSE,) 2 MG/1.5ML SOPN; Inject 0.5 mg into the skin once a week.  Dispense: 1.5 mL; Refill: 0  2. Vitamin D deficiency Travis Huff will continue prescription Vitamin D 50,000 IU every week and we will consider recheck his level in the near future. He will follow-up for routine testing of Vitamin D, at least 2-3 times per year to avoid over-replacement.  3. Other constipation Travis Huff was informed that a decrease in bowel movement frequency is normal while losing weight, but stools should not be hard or painful. Orders and follow up as documented in patient record.   Counseling Getting to Good Bowel Health: Your goal is to have one soft bowel movement each day. Drink at least 8 glasses of water each day. Eat plenty of fiber (goal is over 25 grams each day). It is best to get most of your fiber from dietary sources which includes leafy green vegetables, fresh fruit, and whole grains. You may need to add fiber with the help of OTC fiber supplements. These include Metamucil, Citrucel, and Flaxseed. If you are still having trouble, try adding Miralax or Magnesium Citrate. If all of these changes do not work, Cabin crew.  4. At risk for activity intolerance Travis Huff was given approximately 8 minutes of exercise intolerance counseling  today. He is 60 y.o. male and has risk factors exercise intolerance including obesity. We discussed intensive lifestyle modifications today with an emphasis on specific weight loss instructions and strategies. Travis Huff will slowly increase activity as tolerated.  Repetitive spaced learning was employed today to elicit superior memory formation and behavioral change.  5. Obesity BMI today is 53 Travis Huff is currently in the action stage of change. As such, his goal is to continue with weight loss efforts. He has agreed to the Category 4 Plan.   Holiday recipes were  given to the patient. Christmas eating strategies were discussed and handouts were given.  Exercise goals: All adults should avoid inactivity. Some physical activity is better than none, and adults who participate in any amount of physical activity gain some health benefits. (Uses a cane).  Behavioral modification strategies: holiday eating strategies .  Travis Huff has agreed to follow-up with our clinic in 3 to 4 weeks. He was informed of the importance of frequent follow-up visits to maximize his success with intensive lifestyle modifications for his multiple health conditions.   Objective:   Blood pressure 139/84, pulse 68, temperature 98.6 F (37 C), height 5\' 11"  (1.803 m), weight 244 lb (110.7 kg), SpO2 98 %. Body mass index is 34.03 kg/m.  General: Cooperative, alert, well developed, in no acute distress. HEENT: Conjunctivae and lids unremarkable. Cardiovascular: Regular rhythm.  Lungs: Normal work of breathing. Neurologic: No focal deficits.   Lab Results  Component Value Date   CREATININE 0.93 10/13/2020   BUN 24 (H) 10/13/2020   NA 141 10/13/2020   K 4.2 10/13/2020   CL 104 10/13/2020   CO2 28 10/13/2020   Lab Results  Component Value Date   ALT 34 02/03/2020   AST 39 02/03/2020   ALKPHOS 149 (H) 02/03/2020   BILITOT 0.3 02/03/2020   Lab Results  Component Value Date   HGBA1C 5.5 09/16/2020   HGBA1C 5.8 (H) 05/28/2020   HGBA1C 6.0 (H) 02/03/2020   HGBA1C 5.6 06/19/2018   HGBA1C 5.9 (H) 04/17/2017   Lab Results  Component Value Date   INSULIN 17.2 02/03/2020   Lab Results  Component Value Date   TSH 2.110 02/03/2020   Lab Results  Component Value Date   CHOL 170 02/03/2020   HDL 73 02/03/2020   LDLCALC 82 02/03/2020   TRIG 83 02/03/2020   CHOLHDL 2.3 02/03/2020   Lab Results  Component Value Date   VD25OH 60.9 08/30/2020   VD25OH 26.1 (L) 02/03/2020   Lab Results  Component Value Date   WBC 8.9 10/13/2020   HGB 16.2 10/13/2020   HCT 48.2  10/13/2020   MCV 95.6 10/13/2020   PLT 226 10/13/2020   No results found for: IRON, TIBC, FERRITIN  Attestation Statements:   Reviewed by clinician on day of visit: allergies, medications, problem list, medical history, surgical history, family history, social history, and previous encounter notes.   Wilhemena Durie, am acting as transcriptionist for [employer], DO.  I have reviewed the above documentation for accuracy and completeness, and I agree with the above. Marjory Sneddon, D.O.  The [place] was signed into law in 2016 which includes the topic of electronic health records.  This provides immediate access to information in MyChart.  This includes consultation notes, operative notes, office notes, lab results and pathology reports.  If you have any questions about what you read please let us know at your next visit so we can discuss your  concerns and take corrective action if need be.  We are right here with you.

## 2020-12-23 NOTE — Progress Notes (Signed)
[facility] Summary Report / Loop Recorder 

## 2020-12-31 ENCOUNTER — Other Ambulatory Visit (HOSPITAL_BASED_OUTPATIENT_CLINIC_OR_DEPARTMENT_OTHER): Payer: Self-pay

## 2021-01-04 ENCOUNTER — Other Ambulatory Visit (HOSPITAL_BASED_OUTPATIENT_CLINIC_OR_DEPARTMENT_OTHER): Payer: Self-pay

## 2021-01-05 DIAGNOSIS — Z96612 Presence of left artificial shoulder joint: Secondary | ICD-10-CM | POA: Diagnosis not present

## 2021-01-05 DIAGNOSIS — Z4789 Encounter for other orthopedic aftercare: Secondary | ICD-10-CM | POA: Diagnosis not present

## 2021-01-05 DIAGNOSIS — Z471 Aftercare following joint replacement surgery: Secondary | ICD-10-CM | POA: Diagnosis not present

## 2021-01-10 ENCOUNTER — Other Ambulatory Visit (HOSPITAL_BASED_OUTPATIENT_CLINIC_OR_DEPARTMENT_OTHER): Payer: Self-pay

## 2021-01-10 ENCOUNTER — Encounter (INDEPENDENT_AMBULATORY_CARE_PROVIDER_SITE_OTHER): Payer: Self-pay | Admitting: Family Medicine

## 2021-01-10 ENCOUNTER — Ambulatory Visit (INDEPENDENT_AMBULATORY_CARE_PROVIDER_SITE_OTHER): Payer: 59 | Admitting: Family Medicine

## 2021-01-10 ENCOUNTER — Other Ambulatory Visit: Payer: Self-pay

## 2021-01-10 VITALS — BP 132/84 | HR 64 | Temp 98.1°F | Ht 71.0 in | Wt 240.0 lb

## 2021-01-10 DIAGNOSIS — I10 Essential (primary) hypertension: Secondary | ICD-10-CM | POA: Diagnosis not present

## 2021-01-10 DIAGNOSIS — E559 Vitamin D deficiency, unspecified: Secondary | ICD-10-CM

## 2021-01-10 DIAGNOSIS — Z9189 Other specified personal risk factors, not elsewhere classified: Secondary | ICD-10-CM

## 2021-01-10 DIAGNOSIS — Z6841 Body Mass Index (BMI) 40.0 and over, adult: Secondary | ICD-10-CM

## 2021-01-10 DIAGNOSIS — R7303 Prediabetes: Secondary | ICD-10-CM

## 2021-01-10 DIAGNOSIS — E669 Obesity, unspecified: Secondary | ICD-10-CM

## 2021-01-10 DIAGNOSIS — Z6833 Body mass index (BMI) 33.0-33.9, adult: Secondary | ICD-10-CM

## 2021-01-10 MED ORDER — SEMAGLUTIDE (1 MG/DOSE) 4 MG/3ML ~~LOC~~ SOPN
1.0000 mg | PEN_INJECTOR | SUBCUTANEOUS | 0 refills | Status: DC
Start: 2021-01-10 — End: 2021-01-31
  Filled 2021-01-10: qty 3, 28d supply, fill #0

## 2021-01-11 LAB — COMPREHENSIVE METABOLIC PANEL
ALT: 23 IU/L (ref 0–44)
AST: 33 IU/L (ref 0–40)
Albumin/Globulin Ratio: 2.2 (ref 1.2–2.2)
Albumin: 4.7 g/dL (ref 3.8–4.9)
Alkaline Phosphatase: 91 IU/L (ref 44–121)
BUN/Creatinine Ratio: 25 — ABNORMAL HIGH (ref 9–20)
BUN: 20 mg/dL (ref 6–24)
Bilirubin Total: 0.6 mg/dL (ref 0.0–1.2)
CO2: 24 mmol/L (ref 20–29)
Calcium: 9.7 mg/dL (ref 8.7–10.2)
Chloride: 100 mmol/L (ref 96–106)
Creatinine, Ser: 0.81 mg/dL (ref 0.76–1.27)
Globulin, Total: 2.1 g/dL (ref 1.5–4.5)
Glucose: 91 mg/dL (ref 70–99)
Potassium: 4.2 mmol/L (ref 3.5–5.2)
Sodium: 141 mmol/L (ref 134–144)
Total Protein: 6.8 g/dL (ref 6.0–8.5)
eGFR: 102 mL/min/{1.73_m2} (ref >59)

## 2021-01-11 LAB — HEMOGLOBIN A1C
Est. average glucose Bld gHb Est-mCnc: 108 mg/dL
Hgb A1c MFr Bld: 5.4 % (ref 4.8–5.6)

## 2021-01-11 LAB — VITAMIN D 25 HYDROXY (VIT D DEFICIENCY, FRACTURES): Vit D, 25-Hydroxy: 46.4 ng/mL (ref 30.0–100.0)

## 2021-01-11 NOTE — Progress Notes (Signed)
Chief Complaint:   OBESITY Travis Huff is here to discuss his progress with his obesity treatment plan along with follow-up of his obesity related diagnoses. [name] is on the Category 4 Plan and states he is following his eating plan approximately 70% of the time. Travis Huff states he is not currently exercising.  Today's visit was #: 18 Starting weight: 350 lbs Starting date: 02/03/2020 Today's weight: 240 lbs Today's date: 01/10/2021 Total lbs lost to date: 110 Total lbs lost since last in-office visit: 4  Interim History: Pt is shocked he lost 4 lbs because he ate off-plan a lot more than usual. He has been back on plan for a week.  Subjective:   1. Vitamin D deficiency Pt is consistent with intake but thought NP Katy changed him to OTC Vit D 2,000 IU weekly on 08/31/20, instead of 1 tab PO QD. Ergocalciferol was discontinued by NP Katy at that time. Check labs today.  2. Essential hypertension Pt reports occasional lightheadedness in the morning before breakfast.  3. Pre-diabetes Travis Huff has a diagnosis of prediabetes based on his elevated HgA1c and was informed this puts him at greater risk of developing diabetes. He continues to work on diet and exercise to decrease his risk of diabetes. He denies nausea or hypoglycemia.  4. At risk for activity intolerance Travis Huff is at risk for exercise intolerance due to physical limitations.  Assessment/Plan:   Orders Placed This Encounter  Procedures   VITAMIN D 25 Hydroxy (Vit-D Deficiency, Fractures)   Hemoglobin A1c   Comprehensive metabolic panel    Medications Discontinued During This Encounter  Medication Reason   Semaglutide,0.25 or 0.5MG /DOS, (OZEMPIC, 0.25 OR 0.5 MG/DOSE,) 2 MG/1.5ML SOPN      Meds ordered this encounter  Medications   Semaglutide, 1 MG/DOSE, 4 MG/3ML SOPN    Sig: Inject 1 mg as directed once a week.    Dispense:  3 mL    Refill:  0     1. Vitamin D deficiency Low Vitamin D level contributes to fatigue and  are associated with obesity, breast, and colon cancer. He agrees to take OTC Vitamin D 2,000 IU QD and will follow-up for routine testing of Vitamin D, at least 2-3 times per year to avoid over-replacement. Check labs today.  - VITAMIN D 25 Hydroxy (Vit-D Deficiency, Fractures)  2. Essential hypertension Kynan is working on healthy weight loss and exercise to improve blood pressure control. We will watch for signs of hypotension as he continues his lifestyle modifications. Continue current treatment plan for now. Check labs today.  - Comprehensive metabolic panel  3. Pre-diabetes Travis Huff will increase Ozempic to 1 mg as prescribed and continue to work on weight loss, exercise, and decreasing simple carbohydrates to help decrease the risk of diabetes. Check labs today.  Increase & Refill- Semaglutide, 1 MG/DOSE, 4 MG/3ML SOPN; Inject 1 mg as directed once a week.  Dispense: 3 mL; Refill: 0  - Hemoglobin A1c  4. At risk for activity intolerance Travis Huff was given approximately 195 minutes of exercise intolerance counseling today. He is 61 y.o. male and has risk factors exercise intolerance including obesity. We discussed intensive lifestyle modifications today with an emphasis on specific weight loss instructions and strategies. [name] will slowly increase activity as tolerated.  Repetitive spaced learning was employed today to elicit superior memory formation and behavioral change.   5. Obesity with current BMI of 33.6  Travis Huff is currently in the action stage of change. As such, his goal is  to continue with weight loss efforts. He has agreed to the Category 4 Plan.   Repeat IC for appt in February. Go over labs at next OV.  Exercise goals: All adults should avoid inactivity. Some physical activity is better than none, and adults who participate in any amount of physical activity gain some health benefits.  Behavioral modification strategies: planning for success.  Travis Huff has agreed to follow-up  with our clinic in 3 weeks. He was informed of the importance of frequent follow-up visits to maximize his success with intensive lifestyle modifications for his multiple health conditions.   Objective:   Blood pressure 132/84, pulse 64, temperature 98.1 F (36.7 C), height 5\' 11"  (1.803 m), weight 240 lb (108.9 kg), SpO2 97 %. Body mass index is 33.47 kg/m.  General: Cooperative, alert, well developed, in no acute distress. HEENT: Conjunctivae and lids unremarkable. Cardiovascular: Regular rhythm.  Lungs: Normal work of breathing. Neurologic: No focal deficits.   Lab Results  Component Value Date   CREATININE 0.81 01/10/2021   BUN 20 01/10/2021   NA 141 01/10/2021   K 4.2 01/10/2021   CL 100 01/10/2021   CO2 24 01/10/2021   Lab Results  Component Value Date   ALT 23 01/10/2021   AST 33 01/10/2021   ALKPHOS 91 01/10/2021   BILITOT 0.6 01/10/2021   Lab Results  Component Value Date   HGBA1C 5.4 01/10/2021   HGBA1C 5.5 09/16/2020   HGBA1C 5.8 (H) 05/28/2020   HGBA1C 6.0 (H) 02/03/2020   HGBA1C 5.6 06/19/2018   Lab Results  Component Value Date   INSULIN 17.2 02/03/2020   Lab Results  Component Value Date   TSH 2.110 02/03/2020   Lab Results  Component Value Date   CHOL 170 02/03/2020   HDL 73 02/03/2020   LDLCALC 82 02/03/2020   TRIG 83 02/03/2020   CHOLHDL 2.3 02/03/2020   Lab Results  Component Value Date   VD25OH 46.4 01/10/2021   VD25OH 60.9 08/30/2020   VD25OH 26.1 (L) 02/03/2020   Lab Results  Component Value Date   WBC 8.9 10/13/2020   HGB 16.2 10/13/2020   HCT 48.2 10/13/2020   MCV 95.6 10/13/2020   PLT 226 10/13/2020    Attestation Statements:   Reviewed by clinician on day of visit: allergies, medications, problem list, medical history, surgical history, family history, social history, and previous encounter notes.  Coral Ceo, CMA, am acting as transcriptionist for [employer], DO.  I have reviewed the above  documentation for accuracy and completeness, and I agree with the above. Marjory Sneddon, D.O.  The [place] was signed into law in 2016 which includes the topic of electronic health records.  This provides immediate access to information in MyChart.  This includes consultation notes, operative notes, office notes, lab results and pathology reports.  If you have any questions about what you read please let us know at your next visit so we can discuss your concerns and take corrective action if need be.  We are right here with you.

## 2021-01-13 ENCOUNTER — Other Ambulatory Visit (HOSPITAL_BASED_OUTPATIENT_CLINIC_OR_DEPARTMENT_OTHER): Payer: Self-pay

## 2021-01-13 ENCOUNTER — Other Ambulatory Visit: Payer: Self-pay | Admitting: Cardiology

## 2021-01-13 MED ORDER — METOPROLOL SUCCINATE ER 50 MG PO TB24
50.0000 mg | ORAL_TABLET | Freq: Every day | ORAL | 3 refills | Status: DC
  Filled 2021-01-13: qty 90, 90d supply, fill #0
  Filled 2021-04-13: qty 90, 90d supply, fill #1
  Filled 2021-07-18: qty 90, 90d supply, fill #2
  Filled 2021-08-17 – 2021-10-16 (×2): qty 90, 90d supply, fill #3

## 2021-01-17 ENCOUNTER — Ambulatory Visit (INDEPENDENT_AMBULATORY_CARE_PROVIDER_SITE_OTHER): Payer: 59

## 2021-01-17 DIAGNOSIS — I480 Paroxysmal atrial fibrillation: Secondary | ICD-10-CM

## 2021-01-18 LAB — CUP PACEART REMOTE DEVICE CHECK
Date Time Interrogation Session: 20230115230823
Implantable Pulse Generator Implant Date: 20220727

## 2021-01-23 ENCOUNTER — Other Ambulatory Visit (HOSPITAL_COMMUNITY): Payer: Self-pay | Admitting: Sports Medicine

## 2021-01-23 DIAGNOSIS — F5101 Primary insomnia: Secondary | ICD-10-CM

## 2021-01-24 ENCOUNTER — Other Ambulatory Visit (HOSPITAL_BASED_OUTPATIENT_CLINIC_OR_DEPARTMENT_OTHER): Payer: Self-pay

## 2021-01-24 MED ORDER — ZOLPIDEM TARTRATE 10 MG PO TABS
ORAL_TABLET | Freq: Every evening | ORAL | 1 refills | Status: DC | PRN
  Filled 2021-01-24: qty 90, fill #0
  Filled 2021-01-28: qty 90, 90d supply, fill #0
  Filled 2021-04-30: qty 90, 90d supply, fill #1

## 2021-01-28 ENCOUNTER — Other Ambulatory Visit (HOSPITAL_BASED_OUTPATIENT_CLINIC_OR_DEPARTMENT_OTHER): Payer: Self-pay

## 2021-01-28 MED FILL — Diltiazem HCl Coated Beads Cap ER 24HR 120 MG: ORAL | 90 days supply | Qty: 90 | Fill #3 | Status: AC

## 2021-01-28 MED FILL — Lisinopril & Hydrochlorothiazide Tab 20-25 MG: ORAL | 90 days supply | Qty: 90 | Fill #3 | Status: AC

## 2021-01-28 NOTE — Progress Notes (Signed)
[facility] Summary Report / Loop Recorder 

## 2021-01-31 ENCOUNTER — Other Ambulatory Visit: Payer: Self-pay

## 2021-01-31 ENCOUNTER — Other Ambulatory Visit (HOSPITAL_BASED_OUTPATIENT_CLINIC_OR_DEPARTMENT_OTHER): Payer: Self-pay

## 2021-01-31 ENCOUNTER — Encounter (INDEPENDENT_AMBULATORY_CARE_PROVIDER_SITE_OTHER): Payer: Self-pay | Admitting: Family Medicine

## 2021-01-31 ENCOUNTER — Ambulatory Visit (INDEPENDENT_AMBULATORY_CARE_PROVIDER_SITE_OTHER): Payer: 59 | Admitting: Family Medicine

## 2021-01-31 VITALS — BP 134/84 | HR 66 | Temp 98.7°F | Ht 71.0 in | Wt 237.0 lb

## 2021-01-31 DIAGNOSIS — E559 Vitamin D deficiency, unspecified: Secondary | ICD-10-CM | POA: Diagnosis not present

## 2021-01-31 DIAGNOSIS — E669 Obesity, unspecified: Secondary | ICD-10-CM

## 2021-01-31 DIAGNOSIS — K5909 Other constipation: Secondary | ICD-10-CM | POA: Diagnosis not present

## 2021-01-31 DIAGNOSIS — Z6833 Body mass index (BMI) 33.0-33.9, adult: Secondary | ICD-10-CM

## 2021-01-31 DIAGNOSIS — R7303 Prediabetes: Secondary | ICD-10-CM

## 2021-01-31 DIAGNOSIS — Z9189 Other specified personal risk factors, not elsewhere classified: Secondary | ICD-10-CM

## 2021-01-31 DIAGNOSIS — Z6841 Body Mass Index (BMI) 40.0 and over, adult: Secondary | ICD-10-CM

## 2021-01-31 MED ORDER — SEMAGLUTIDE (1 MG/DOSE) 4 MG/3ML ~~LOC~~ SOPN
1.0000 mg | PEN_INJECTOR | SUBCUTANEOUS | 0 refills | Status: DC
  Filled 2021-01-31: qty 3, 28d supply, fill #0

## 2021-02-01 ENCOUNTER — Other Ambulatory Visit (HOSPITAL_BASED_OUTPATIENT_CLINIC_OR_DEPARTMENT_OTHER): Payer: Self-pay

## 2021-02-01 NOTE — Progress Notes (Addendum)
Chief Complaint:   OBESITY Travis Huff is here to discuss his progress with his obesity treatment plan along with follow-up of his obesity related diagnoses. [name] is on the Category 4 Plan and states he is following his eating plan approximately 85-90% of the time. [name] states he is not currently exercising.  Today's visit was #: 64 Starting weight: 350 lbs Starting date: 02/03/2020 Today's weight: 237 lbs Today's date: 01/31/2021 Total lbs lost to date: 113 Total lbs lost since last in-office visit: 3  Interim History: Pt has lost 4.5 lbs in fat mass and gained a little in muscle mass and water since last OV.   Subjective:   1. Vitamin D deficiency Discussed labs with patient today. At goal 46.9. Pt has been on 2000 IU weekly since OV with NP Katy on 08/31/2020.   2. Pre-diabetes Discussed labs with patient today. Pt denies hunger but reports some cravings occasionally after dinner, in which he has cheese stick. No issues. Cravings well controlled.  3. Other constipation Pt reports BM every 2-3 days now, while on plan and on Ozempic. He normally has BM every 1-2 days.  4. At risk for impaired metabolic function [name] is at increased risk for impaired metabolic function due to current nutrition and muscle mass.  Assessment/Plan:  No orders of the defined types were placed in this encounter.   Medications Discontinued During This Encounter  Medication Reason   Cholecalciferol (VITAMIN D3) 1.25 MG (50000 UT) CAPS    Semaglutide, 1 MG/DOSE, 4 MG/3ML SOPN Reorder     Meds ordered this encounter  Medications   Semaglutide, 1 MG/DOSE, 4 MG/3ML SOPN    Sig: Inject 1 mg as directed once a week.    Dispense:  3 mL    Refill:  0     1. Vitamin D deficiency Vit D essentially at goal. Continue current dose or may discontinue, if in fact, he is only taking 2K IU weekly. Pt will let us know for sure.  2. Pre-diabetes Marina will continue to work on weight loss, exercise, and  decreasing simple carbohydrates to help decrease the risk of diabetes.   Refill- Semaglutide, 1 MG/DOSE, 4 MG/3ML SOPN; Inject 1 mg as directed once a week.  Dispense: 3 mL; Refill: 0  3. Other constipation Erie was informed that a decrease in bowel movement frequency is normal while losing weight, but stools should not be hard or painful. Orders and follow up as documented in patient record. Use Miralax prn.  Counseling Getting to Good Bowel Health: Your goal is to have one soft bowel movement each day. Drink at least 8 glasses of water each day. Eat plenty of fiber (goal is over 25 grams each day). It is best to get most of your fiber from dietary sources which includes leafy green vegetables, fresh fruit, and whole grains. You may need to add fiber with the help of OTC fiber supplements. These include Metamucil, Citrucel, and Flaxseed. If you are still having trouble, try adding Miralax or Magnesium Citrate. If all of these changes do not work, Cabin [employer].  4. At risk for impaired metabolic function [name] was given approximately 9 minutes of impaired  metabolic function prevention counseling today. We discussed intensive lifestyle modifications today with an emphasis on specific nutrition and exercise instructions and strategies.   Repetitive spaced learning was employed today to elicit superior memory formation and behavioral change.  5. Obesity with current BMI of 33.1 [name] is currently in the  action stage of change. As such, his goal is to continue with weight loss efforts. He has agreed to the Category 4 Plan.   Exercise goals:  As tolerated due to physical limitations.  Behavioral modification strategies: increasing lean protein intake, decreasing simple carbohydrates, and avoiding temptations.  [name] has agreed to follow-up with our clinic in 3 weeks, fasting for labs and IC. He was informed of the importance of frequent follow-up visits to maximize his success with  intensive lifestyle modifications for his multiple health conditions.     Objective:   Blood pressure 134/84, pulse 66, temperature 98.7 F (37.1 C), height 5\' 11"  (1.803 m), weight 237 lb (107.5 kg), SpO2 96 %. Body mass index is 33.05 kg/m.  General: Cooperative, alert, well developed, in no acute distress. HEENT: Conjunctivae and lids unremarkable. Cardiovascular: Regular rhythm.  Lungs: Normal work of breathing. Neurologic: No focal deficits.   Lab Results  Component Value Date   CREATININE 0.81 01/10/2021   BUN 20 01/10/2021   NA 141 01/10/2021   K 4.2 01/10/2021   CL 100 01/10/2021   CO2 24 01/10/2021   Lab Results  Component Value Date   ALT 23 01/10/2021   AST 33 01/10/2021   ALKPHOS 91 01/10/2021   BILITOT 0.6 01/10/2021   Lab Results  Component Value Date   HGBA1C 5.4 01/10/2021   HGBA1C 5.5 09/16/2020   HGBA1C 5.8 (H) 05/28/2020   HGBA1C 6.0 (H) 02/03/2020   HGBA1C 5.6 06/19/2018   Lab Results  Component Value Date   INSULIN 17.2 02/03/2020   Lab Results  Component Value Date   TSH 2.110 02/03/2020   Lab Results  Component Value Date   CHOL 170 02/03/2020   HDL 73 02/03/2020   LDLCALC 82 02/03/2020   TRIG 83 02/03/2020   CHOLHDL 2.3 02/03/2020   Lab Results  Component Value Date   VD25OH 46.4 01/10/2021   VD25OH 60.9 08/30/2020   VD25OH 26.1 (L) 02/03/2020   Lab Results  Component Value Date   WBC 8.9 10/13/2020   HGB 16.2 10/13/2020   HCT 48.2 10/13/2020   MCV 95.6 10/13/2020   PLT 226 10/13/2020   Attestation Statements:   Reviewed by clinician on day of visit: allergies, medications, problem list, medical history, surgical history, family history, social history, and previous encounter notes.  Coral Ceo, CMA, am acting as transcriptionist for [employer], DO.  I have reviewed the above documentation for accuracy and completeness, and I agree with the above. Marjory Sneddon, D.O.  The [place]  was signed into law in 2016 which includes the topic of electronic health records.  This provides immediate access to information in MyChart.  This includes consultation notes, operative notes, office notes, lab results and pathology reports.  If you have any questions about what you read please let us know at your next visit so we can discuss your concerns and take corrective action if need be.  We are right here with you.

## 2021-02-21 ENCOUNTER — Other Ambulatory Visit: Payer: Self-pay

## 2021-02-21 ENCOUNTER — Other Ambulatory Visit (HOSPITAL_BASED_OUTPATIENT_CLINIC_OR_DEPARTMENT_OTHER): Payer: Self-pay

## 2021-02-21 ENCOUNTER — Ambulatory Visit (INDEPENDENT_AMBULATORY_CARE_PROVIDER_SITE_OTHER): Payer: 59 | Admitting: Family Medicine

## 2021-02-21 ENCOUNTER — Encounter (INDEPENDENT_AMBULATORY_CARE_PROVIDER_SITE_OTHER): Payer: Self-pay | Admitting: Family Medicine

## 2021-02-21 ENCOUNTER — Ambulatory Visit (INDEPENDENT_AMBULATORY_CARE_PROVIDER_SITE_OTHER): Payer: 59

## 2021-02-21 VITALS — BP 138/87 | HR 61 | Temp 97.9°F | Ht 71.0 in | Wt 234.0 lb

## 2021-02-21 DIAGNOSIS — R0602 Shortness of breath: Secondary | ICD-10-CM | POA: Diagnosis not present

## 2021-02-21 DIAGNOSIS — E559 Vitamin D deficiency, unspecified: Secondary | ICD-10-CM

## 2021-02-21 DIAGNOSIS — E7849 Other hyperlipidemia: Secondary | ICD-10-CM

## 2021-02-21 DIAGNOSIS — E538 Deficiency of other specified B group vitamins: Secondary | ICD-10-CM

## 2021-02-21 DIAGNOSIS — R7303 Prediabetes: Secondary | ICD-10-CM | POA: Diagnosis not present

## 2021-02-21 DIAGNOSIS — Z6832 Body mass index (BMI) 32.0-32.9, adult: Secondary | ICD-10-CM | POA: Diagnosis not present

## 2021-02-21 DIAGNOSIS — Z9189 Other specified personal risk factors, not elsewhere classified: Secondary | ICD-10-CM

## 2021-02-21 DIAGNOSIS — I10 Essential (primary) hypertension: Secondary | ICD-10-CM | POA: Diagnosis not present

## 2021-02-21 DIAGNOSIS — E669 Obesity, unspecified: Secondary | ICD-10-CM

## 2021-02-21 DIAGNOSIS — E785 Hyperlipidemia, unspecified: Secondary | ICD-10-CM | POA: Diagnosis not present

## 2021-02-21 DIAGNOSIS — I480 Paroxysmal atrial fibrillation: Secondary | ICD-10-CM

## 2021-02-21 LAB — CUP PACEART REMOTE DEVICE CHECK
Date Time Interrogation Session: 20230219231512
Implantable Pulse Generator Implant Date: 20220727

## 2021-02-21 MED ORDER — SEMAGLUTIDE (1 MG/DOSE) 4 MG/3ML ~~LOC~~ SOPN
1.0000 mg | PEN_INJECTOR | SUBCUTANEOUS | 0 refills | Status: DC
  Filled 2021-02-21 – 2021-02-23 (×2): qty 3, 28d supply, fill #0

## 2021-02-21 NOTE — Progress Notes (Signed)
Chief Complaint:   OBESITY Travis Huff is here to discuss his progress with his obesity treatment plan along with follow-up of his obesity related diagnoses. Travis Huff is on the Category 4 Plan and states he is following his eating plan approximately 85% of the time. Travis Huff states he is doing 0 minutes 0 times per week.  Today's visit was #: 20 Starting weight: 350 lbs Starting date: 02/03/2020 Today's weight: 234 lbs Today's date: 02/21/2021 Total lbs lost to date: 116 Total lbs lost since last in-office visit: 3  Interim History: Travis Huff is here for a follow up office visit. We reviewed his meal plan and questions were answered. Patient's food recall appears to be accurate and consistent with what is on plan when he is following it. When eating on plan, his hunger and cravings are well controlled.    Subjective:   1. Other hyperlipidemia Travis Huff's last labs were done 1 year ago on 02/03/2020, FLP was at goal at that time.  2. Pre-diabetes Travis Huff has a diagnosis of pre-diabetes based on his elevated HgA1c and was informed this puts him at greater risk of developing diabetes. He continues to work on diet and exercise to decrease his risk of diabetes. He denies nausea or hypoglycemia.  3. Essential hypertension Travis Huff's blood pressure is at goal today.  BP Readings from Last 3 Encounters:  02/21/21 138/87  01/31/21 134/84  01/10/21 132/84   4. B12 deficiency Travis Huff's last B12 was not at goal of over 500. He only occasionally takes a multivitamin.  5. Vitamin D deficiency Travis Huff takes 1,000 OTC Vit D weekly only. Since 08/31/2020, one month ago his Vit D level was 46.4. I discussed labs with the patient today.  6. SOBOE (shortness of breath on exertion) Travis Huff notes increasing shortness of breath with exercising and seems to be worsening over time with weight gain. He notes getting out of breath sooner with activity than he used to. Travis Huff denies shortness of breath at rest or  orthopnea.  7. At risk for activity intolerance Travis Huff is at risk for exercise intolerance due to back and hip pain, etc.  Assessment/Plan:   Orders Placed This Encounter  Procedures   Lipid panel   Insulin, random   CBC with Differential/Platelet   TSH   Vitamin B12    Medications Discontinued During This Encounter  Medication Reason   Semaglutide, 1 MG/DOSE, 4 MG/3ML SOPN Reorder     Meds ordered this encounter  Medications   Semaglutide, 1 MG/DOSE, 4 MG/3ML SOPN    Sig: Inject 1 mg as directed once a week.    Dispense:  3 mL    Refill:  0     1. Other hyperlipidemia Cardiovascular risk and specific lipid/LDL goals reviewed.  We discussed several lifestyle modifications today. We will check labs today per patient's request. Travis Huff will continue to work on diet, exercise and weight loss efforts. Orders and follow up as documented in patient record.   Counseling Intensive lifestyle modifications are the first line treatment for this issue. Dietary changes: Increase soluble fiber. Decrease simple carbohydrates. Exercise changes: Moderate to vigorous-intensity aerobic activity 150 minutes per week if tolerated. Lipid-lowering medications: see documented in medical record.  - Lipid panel  2. Pre-diabetes We will check labs today, and we will refill Ozempic 1 mg for 1 month. Travis Huff will continue to work on weight loss, exercise, and decreasing simple carbohydrates to help decrease the risk of diabetes.   - Semaglutide, 1 MG/DOSE, 4  MG/3ML SOPN; Inject 1 mg as directed once a week.  Dispense: 3 mL; Refill: 0 - Insulin, random  3. Essential hypertension We will check labs today. Travis Huff is working on healthy weight loss and exercise to improve blood pressure control. We will watch for signs of hypotension as he continues his lifestyle modifications.  - CBC with Differential/Platelet - TSH  4. B12 deficiency The diagnosis was reviewed with the patient. Counseling provided  today, see below. We will check labs today. Orders and follow up as documented in patient record.  Counseling The body needs vitamin B12: to make red blood cells; to make DNA; and to help the nerves work properly so they can carry messages from the brain to the body.  The main causes of vitamin B12 deficiency include dietary deficiency, digestive diseases, pernicious anemia, and having a surgery in which part of the stomach or small intestine is removed.  Certain medicines can make it harder for the body to absorb vitamin B12. These medicines include: heartburn medications; some antibiotics; some medications used to treat diabetes, gout, and high cholesterol.  In some cases, there are no symptoms of this condition. If the condition leads to anemia or nerve damage, various symptoms can occur, such as weakness or fatigue, shortness of breath, and numbness or tingling in your hands and feet.   Treatment:  May include taking vitamin B12 supplements.  Avoid alcohol.  Eat lots of healthy foods that contain vitamin B12: Beef, pork, chicken, [place], and organ meats, such as liver.  Seafood: This includes clams, rainbow trout, salmon, tuna, and haddock. Eggs.  Cereal and dairy products that are fortified: This means that vitamin B12 has been added to the food.   - Vitamin B12  5. Vitamin D deficiency Travis Huff will continue OTC Vitamin D at same dose, with warm weather around the corner and the fact that he loves outdoors/yard work etc. We will recheck Vit D level in 3 months.  6. SOBOE (shortness of breath on exertion) Travis Huff was repeated today. Said has agreed to work on weight loss and gradually increase exercise to treat his exercise induced shortness of breath. Will continue to monitor closely.  7. At risk for activity intolerance Travis Huff was given approximately 10 minutes of exercise intolerance counseling today. He is 61 y.o. male and has risk factors exercise intolerance including obesity. We discussed  intensive lifestyle modifications today with an emphasis on specific weight loss instructions and strategies. Travis Huff will slowly increase activity as tolerated.  Repetitive spaced learning was employed today to elicit superior memory formation and behavioral change.  8. Obesity with current BMI of 32.7 Travis Huff is currently in the action stage of change. As such, his goal is to continue with weight loss efforts. He has agreed to the Category 4 Plan.   Travis Huff's new REE is 2621. Continue Category 4 meal plan. Little change from prior at [place] even though he has lost 116 lbs total.  Exercise goals: Increase exercise as tolerated.  Behavioral modification strategies: increasing lean protein intake, decreasing simple carbohydrates, and planning for success.  Travis Huff has agreed to follow-up with our clinic in 3 weeks. He was informed of the importance of frequent follow-up visits to maximize his success with intensive lifestyle modifications for his multiple health conditions.   Objective:   Blood pressure 138/87, pulse 61, temperature 97.9 F (36.6 C), temperature source Oral, height 5\' 11"  (1.803 m), weight 234 lb (106.1 kg), SpO2 95 %. Body mass index is 32.64 kg/m.  General:  Cooperative, alert, well developed, in no acute distress. HEENT: Conjunctivae and lids unremarkable. Cardiovascular: Regular rhythm.  Lungs: Normal work of breathing. Neurologic: No focal deficits.   Lab Results  Component Value Date   CREATININE 0.81 01/10/2021   BUN 20 01/10/2021   NA 141 01/10/2021   K 4.2 01/10/2021   CL 100 01/10/2021   CO2 24 01/10/2021   Lab Results  Component Value Date   ALT 23 01/10/2021   AST 33 01/10/2021   ALKPHOS 91 01/10/2021   BILITOT 0.6 01/10/2021   Lab Results  Component Value Date   HGBA1C 5.4 01/10/2021   HGBA1C 5.5 09/16/2020   HGBA1C 5.8 (H) 05/28/2020   HGBA1C 6.0 (H) 02/03/2020   HGBA1C 5.6 06/19/2018   Lab Results  Component Value Date   INSULIN 17.2  02/03/2020   Lab Results  Component Value Date   TSH 2.110 02/03/2020   Lab Results  Component Value Date   CHOL 170 02/03/2020   HDL 73 02/03/2020   LDLCALC 82 02/03/2020   TRIG 83 02/03/2020   CHOLHDL 2.3 02/03/2020   Lab Results  Component Value Date   VD25OH 46.4 01/10/2021   VD25OH 60.9 08/30/2020   VD25OH 26.1 (L) 02/03/2020   Lab Results  Component Value Date   WBC 8.9 10/13/2020   HGB 16.2 10/13/2020   HCT 48.2 10/13/2020   MCV 95.6 10/13/2020   PLT 226 10/13/2020   No results found for: IRON, TIBC, FERRITIN  Attestation Statements:   Reviewed by clinician on day of visit: allergies, medications, problem list, medical history, surgical history, family history, social history, and previous encounter notes.  Wilhemena Durie, am acting as transcriptionist for [employer], DO.  I have reviewed the above documentation for accuracy and completeness, and I agree with the above. Marjory Sneddon, D.O.  The [place] was signed into law in 2016 which includes the topic of electronic health records.  This provides immediate access to information in MyChart.  This includes consultation notes, operative notes, office notes, lab results and pathology reports.  If you have any questions about what you read please let us know at your next visit so we can discuss your concerns and take corrective action if need be.  We are right here with you.

## 2021-02-22 LAB — CBC WITH DIFFERENTIAL/PLATELET
Basophils Absolute: 0.1 10*3/uL (ref 0.0–0.2)
Basos: 1 %
EOS (ABSOLUTE): 0.2 10*3/uL (ref 0.0–0.4)
Eos: 3 %
Hematocrit: 46.4 % (ref 37.5–51.0)
Hemoglobin: 16.4 g/dL (ref 13.0–17.7)
Immature Grans (Abs): 0 10*3/uL (ref 0.0–0.1)
Immature Granulocytes: 0 %
Lymphocytes Absolute: 1 10*3/uL (ref 0.7–3.1)
Lymphs: 17 %
MCH: 33.2 pg — ABNORMAL HIGH (ref 26.6–33.0)
MCHC: 35.3 g/dL (ref 31.5–35.7)
MCV: 94 fL (ref 79–97)
Monocytes Absolute: 0.5 10*3/uL (ref 0.1–0.9)
Monocytes: 8 %
Neutrophils Absolute: 4.1 10*3/uL (ref 1.4–7.0)
Neutrophils: 71 %
Platelets: 192 10*3/uL (ref 150–450)
RBC: 4.94 x10E6/uL (ref 4.14–5.80)
RDW: 13.1 % (ref 11.6–15.4)
WBC: 5.8 10*3/uL (ref 3.4–10.8)

## 2021-02-22 LAB — INSULIN, RANDOM: INSULIN: 10.3 u[IU]/mL (ref 2.6–24.9)

## 2021-02-22 LAB — LIPID PANEL
Chol/HDL Ratio: 2.1 ratio (ref 0.0–5.0)
Cholesterol, Total: 128 mg/dL (ref 100–199)
HDL: 62 mg/dL (ref >39)
LDL Chol Calc (NIH): 52 mg/dL (ref 0–99)
Triglycerides: 68 mg/dL (ref 0–149)
VLDL Cholesterol Cal: 14 mg/dL (ref 5–40)

## 2021-02-22 LAB — TSH: TSH: 2.44 u[IU]/mL (ref 0.450–4.500)

## 2021-02-22 LAB — VITAMIN B12: Vitamin B-12: 428 pg/mL (ref 232–1245)

## 2021-02-23 ENCOUNTER — Other Ambulatory Visit (HOSPITAL_BASED_OUTPATIENT_CLINIC_OR_DEPARTMENT_OTHER): Payer: Self-pay

## 2021-03-01 NOTE — Progress Notes (Signed)
[facility] Summary Report / Loop Recorder 

## 2021-03-02 ENCOUNTER — Other Ambulatory Visit (HOSPITAL_BASED_OUTPATIENT_CLINIC_OR_DEPARTMENT_OTHER): Payer: Self-pay

## 2021-03-07 ENCOUNTER — Encounter (INDEPENDENT_AMBULATORY_CARE_PROVIDER_SITE_OTHER): Payer: Self-pay | Admitting: Family Medicine

## 2021-03-07 ENCOUNTER — Ambulatory Visit: Payer: 59 | Admitting: Physician Assistant

## 2021-03-08 NOTE — Telephone Encounter (Signed)
Dr.Opalski ?

## 2021-03-14 ENCOUNTER — Ambulatory Visit (INDEPENDENT_AMBULATORY_CARE_PROVIDER_SITE_OTHER): Payer: 59 | Admitting: Family Medicine

## 2021-03-14 ENCOUNTER — Encounter (INDEPENDENT_AMBULATORY_CARE_PROVIDER_SITE_OTHER): Payer: Self-pay | Admitting: Family Medicine

## 2021-03-14 ENCOUNTER — Other Ambulatory Visit (HOSPITAL_BASED_OUTPATIENT_CLINIC_OR_DEPARTMENT_OTHER): Payer: Self-pay

## 2021-03-14 ENCOUNTER — Other Ambulatory Visit: Payer: Self-pay

## 2021-03-14 VITALS — BP 134/88 | HR 74 | Temp 98.0°F | Ht 71.0 in | Wt 227.0 lb

## 2021-03-14 DIAGNOSIS — E559 Vitamin D deficiency, unspecified: Secondary | ICD-10-CM | POA: Diagnosis not present

## 2021-03-14 DIAGNOSIS — Z9189 Other specified personal risk factors, not elsewhere classified: Secondary | ICD-10-CM

## 2021-03-14 DIAGNOSIS — E538 Deficiency of other specified B group vitamins: Secondary | ICD-10-CM | POA: Diagnosis not present

## 2021-03-14 DIAGNOSIS — E7849 Other hyperlipidemia: Secondary | ICD-10-CM

## 2021-03-14 DIAGNOSIS — R7303 Prediabetes: Secondary | ICD-10-CM

## 2021-03-14 DIAGNOSIS — Z6841 Body Mass Index (BMI) 40.0 and over, adult: Secondary | ICD-10-CM

## 2021-03-14 MED ORDER — SEMAGLUTIDE-WEIGHT MANAGEMENT 1 MG/0.5ML ~~LOC~~ SOAJ
1.0000 mg | SUBCUTANEOUS | 0 refills | Status: AC
  Filled 2021-03-14: qty 2, 28d supply, fill #0
  Filled 2021-03-28: qty 2, 30d supply, fill #0

## 2021-03-14 MED ORDER — B-12 500 MCG PO TABS: ORAL_TABLET | ORAL | 0 refills | Status: DC

## 2021-03-15 ENCOUNTER — Other Ambulatory Visit (HOSPITAL_BASED_OUTPATIENT_CLINIC_OR_DEPARTMENT_OTHER): Payer: Self-pay

## 2021-03-16 ENCOUNTER — Other Ambulatory Visit (HOSPITAL_BASED_OUTPATIENT_CLINIC_OR_DEPARTMENT_OTHER): Payer: Self-pay

## 2021-03-16 DIAGNOSIS — E538 Deficiency of other specified B group vitamins: Secondary | ICD-10-CM | POA: Insufficient documentation

## 2021-03-16 NOTE — Progress Notes (Signed)
? ? ? ?Chief Complaint:  ? ?OBESITY ?Travis Huff is here to discuss his progress with his obesity treatment plan along with follow-up of his obesity related diagnoses. Travis Huff is on the Category 4 Plan and states he is following his eating plan approximately 80-85% of the time. Travis Huff states he is doing 0 minutes 0 times per week. ? ?Today's visit was #: 21 ?Starting weight: 350 lbs ?Starting date: 02/03/2020 ?Today's weight: 227 lbs ?Today's date: 03/14/2021 ?Total lbs lost to date: 123 ?Total lbs lost since last in-office visit: 7 ? ?Interim History: Travis Huff did a lot of snacking lately, stress eating, and is not sleeping well. He is dealing with his mom with deteriorating dementia (at [place]). He has lost 123 lbs!. He is here to review labs. ? ?Subjective:  ? ?1. Pre-diabetes ?Travis Huff's A1c is 5.4 and fasting insulin is at 10. His A1c is at goal. I discussed labs with the patient today. ? ?2. Other hyperlipidemia ?Travis Huff's labs are at goal. He is taking Lipitor. I discussed labs with the patient today. ? ?3. Vitamin D deficiency ?Travis Huff's Vit D level is at goal. He just started Vit D OTC 1,000 IU daily. I discussed labs with the patient today. ? ?4. B12 deficiency ?Travis Huff's B12 is 428, not at goal. He is not on B12 supplementation currently. I discussed labs with the patient today. ? ?5. At risk for activity intolerance ?Travis Huff is at risk for exercise intolerance due to physical limitations ? ? ?Assessment/Plan:  ? ?Meds ordered this encounter  ?Medications  ? Semaglutide-Weight Management 1 MG/0.5ML SOAJ  ?  Sig: Inject 1 mg into the skin once a week.  ?  Dispense:  2 mL  ?  Refill:  0  ?  30 d supply;  ** OV for RF **   Do not send RF request ?\  ? Cyanocobalamin (B-12) 500 MCG TABS  ?  Sig: 300 mcg- 500 mcg qd B12  ?  Dispense:  90 tablet  ?  Refill:  0  ?  ? ?1. Pre-diabetes ?Travis Huff will continue to work on weight loss, exercise, and decreasing simple carbohydrates to help decrease the risk of diabetes.  ? ?2. Other  hyperlipidemia ?Cardiovascular risk and specific lipid/LDL goals reviewed. We discussed several lifestyle modifications today. Travis Huff will continue Lipitor, and continue to work on diet, exercise and weight loss efforts. Orders and follow up as documented in patient record.  ? ?Counseling ?Intensive lifestyle modifications are the first line treatment for this issue. ?Dietary changes: Increase soluble fiber. Decrease simple carbohydrates. ?Exercise changes: Moderate to vigorous-intensity aerobic activity 150 minutes per week if tolerated. ?Lipid-lowering medications: see documented in medical record. ? ?3. Vitamin D deficiency ?Travis Huff will continue OTC Vitamin D 1,000 IU daily and will follow-up for routine testing of Vitamin D, at least 2-3 times per year to avoid over-replacement. ? ?4. B12 deficiency ?The diagnosis was reviewed with the patient. Counseling provided today, see below. Travis Huff's goal is for his B12 level to be above 500. He is to add B12 supplement 300 mcg-500 mcg daily. Orders and follow up as documented in patient record. ? ?Counseling ?The body needs vitamin B12: to make red blood cells; to make DNA; and to help the nerves work properly so they can carry messages from the brain to the body.  ?The main causes of vitamin B12 deficiency include dietary deficiency, digestive diseases, pernicious anemia, and having a surgery in which part of the stomach or small intestine is removed.  ?Certain  medicines can make it harder for the body to absorb vitamin B12. These medicines include: heartburn medications; some antibiotics; some medications used to treat diabetes, gout, and high cholesterol.  ?In some cases, there are no symptoms of this condition. If the condition leads to anemia or nerve damage, various symptoms can occur, such as weakness or fatigue, shortness of breath, and numbness or tingling in your hands and feet.   ?Treatment:  ?May include taking vitamin B12 supplements.  ?Avoid alcohol.  ?Eat  lots of healthy foods that contain vitamin B12: ?Beef, pork, chicken, [place], and organ meats, such as liver.  ?Seafood: This includes clams, rainbow trout, salmon, tuna, and haddock. Eggs.  ?Cereal and dairy products that are fortified: This means that vitamin B12 has been added to the food.  ? ?- Cyanocobalamin (B-12) 500 MCG TABS; 300 mcg- 500 mcg qd B12  Dispense: 90 tablet; Refill: 0 ? ?5.  At risk for activity intolerance ?Travis Huff was given approximately 195 minutes of exercise intolerance counseling today. He is 61 y.o. male and has risk factors exercise intolerance including obesity. We discussed intensive lifestyle modifications today with an emphasis on specific weight loss instructions and strategies. Travis Huff will slowly increase activity as tolerated. ?  ?Repetitive spaced learning was employed today to elicit superior memory formation and behavioral change ? ? ?6. Obesity with current BMI of 31.7 ?Travis Huff is currently in the action stage of change. As such, his goal is to continue with weight loss efforts. He has agreed to the Category 4 Plan.  ? ? ?We discussed various medication options to help Travis Huff with his weight loss efforts and we both agreed to continue Semaglutide, and we will refill for 1 month. ? ?- Semaglutide-Weight Management 1 MG/0.5ML SOAJ; Inject 1 mg into the skin once a week.  Dispense: 2 mL; Refill: 0 ? ? ?Exercise goals: All adults should avoid inactivity. Some physical activity is better than none, and adults who participate in any amount of physical activity gain some health benefits. ? ?Behavioral modification strategies: planning for success. ? ?Travis Huff has agreed to follow-up with our clinic in 3 to 4 weeks. He was informed of the importance of frequent follow-up visits to maximize his success with intensive lifestyle modifications for his multiple health conditions.  ? ? ? ?Objective:  ? ?Blood pressure 134/88, pulse 74, temperature 98 ?F (36.7 ?C), height '5\' 11"'$  (1.803 m), weight 227  lb (103 kg), SpO2 95 %. ?Body mass index is 31.66 kg/m?. ? ?General: Cooperative, alert, well developed, in no acute distress. ?HEENT: Conjunctivae and lids unremarkable. ?Cardiovascular: Regular rhythm.  ?Lungs: Normal work of breathing. ?Neurologic: No focal deficits.  ? ?Lab Results  ?Component Value Date  ? CREATININE 0.81 01/10/2021  ? BUN 20 01/10/2021  ? NA 141 01/10/2021  ? K 4.2 01/10/2021  ? CL 100 01/10/2021  ? CO2 24 01/10/2021  ? ?Lab Results  ?Component Value Date  ? ALT 23 01/10/2021  ? AST 33 01/10/2021  ? ALKPHOS 91 01/10/2021  ? BILITOT 0.6 01/10/2021  ? ?Lab Results  ?Component Value Date  ? HGBA1C 5.4 01/10/2021  ? HGBA1C 5.5 09/16/2020  ? HGBA1C 5.8 (H) 05/28/2020  ? HGBA1C 6.0 (H) 02/03/2020  ? HGBA1C 5.6 06/19/2018  ? ?Lab Results  ?Component Value Date  ? INSULIN 10.3 02/21/2021  ? INSULIN 17.2 02/03/2020  ? ?Lab Results  ?Component Value Date  ? TSH 2.440 02/21/2021  ? ?Lab Results  ?Component Value Date  ? CHOL 128 02/21/2021  ?  HDL 62 02/21/2021  ? [place] 52 02/21/2021  ? TRIG 68 02/21/2021  ? CHOLHDL 2.1 02/21/2021  ? ?Lab Results  ?Component Value Date  ? VD25OH 46.4 01/10/2021  ? VD25OH 60.9 08/30/2020  ? VD25OH 26.1 (L) 02/03/2020  ? ?Lab Results  ?Component Value Date  ? WBC 5.8 02/21/2021  ? HGB 16.4 02/21/2021  ? HCT 46.4 02/21/2021  ? MCV 94 02/21/2021  ? PLT 192 02/21/2021  ? ?No results found for: IRON, TIBC, FERRITIN ? ?Attestation Statements:  ? ?Reviewed by clinician on day of visit: allergies, medications, problem list, medical history, surgical history, family history, social history, and previous encounter notes. ? ? ?I, Trixie Dredge, am acting as transcriptionist for [employer], DO. ? ?I have reviewed the above documentation for accuracy and completeness, and I agree with the above. Marjory Sneddon, D.O. ? ?The [place] was signed into law in 2016 which includes the topic of electronic health records.  This provides immediate access to information  in MyChart.  This includes consultation notes, operative notes, office notes, lab results and pathology reports.  If you have any questions about what you read please let us know at your next visit so we

## 2021-03-17 ENCOUNTER — Other Ambulatory Visit (HOSPITAL_BASED_OUTPATIENT_CLINIC_OR_DEPARTMENT_OTHER): Payer: Self-pay

## 2021-03-22 ENCOUNTER — Other Ambulatory Visit (HOSPITAL_BASED_OUTPATIENT_CLINIC_OR_DEPARTMENT_OTHER): Payer: Self-pay

## 2021-03-28 ENCOUNTER — Other Ambulatory Visit (HOSPITAL_BASED_OUTPATIENT_CLINIC_OR_DEPARTMENT_OTHER): Payer: Self-pay

## 2021-03-28 ENCOUNTER — Ambulatory Visit (INDEPENDENT_AMBULATORY_CARE_PROVIDER_SITE_OTHER): Payer: 59

## 2021-03-28 DIAGNOSIS — I255 Ischemic cardiomyopathy: Secondary | ICD-10-CM | POA: Diagnosis not present

## 2021-03-29 ENCOUNTER — Other Ambulatory Visit (HOSPITAL_BASED_OUTPATIENT_CLINIC_OR_DEPARTMENT_OTHER): Payer: Self-pay

## 2021-03-29 LAB — CUP PACEART REMOTE DEVICE CHECK
Date Time Interrogation Session: 20230324230728
Implantable Pulse Generator Implant Date: 20220727

## 2021-04-04 ENCOUNTER — Ambulatory Visit (INDEPENDENT_AMBULATORY_CARE_PROVIDER_SITE_OTHER): Payer: 59 | Admitting: Family Medicine

## 2021-04-04 ENCOUNTER — Encounter (INDEPENDENT_AMBULATORY_CARE_PROVIDER_SITE_OTHER): Payer: Self-pay | Admitting: Family Medicine

## 2021-04-04 VITALS — BP 122/88 | HR 73 | Temp 98.3°F | Ht 71.0 in | Wt 227.0 lb

## 2021-04-04 DIAGNOSIS — I10 Essential (primary) hypertension: Secondary | ICD-10-CM | POA: Diagnosis not present

## 2021-04-04 DIAGNOSIS — Z6831 Body mass index (BMI) 31.0-31.9, adult: Secondary | ICD-10-CM | POA: Diagnosis not present

## 2021-04-04 DIAGNOSIS — Z9189 Other specified personal risk factors, not elsewhere classified: Secondary | ICD-10-CM

## 2021-04-04 DIAGNOSIS — G8929 Other chronic pain: Secondary | ICD-10-CM

## 2021-04-04 DIAGNOSIS — E669 Obesity, unspecified: Secondary | ICD-10-CM

## 2021-04-07 NOTE — Progress Notes (Signed)
[facility] Summary Report / Loop Recorder 

## 2021-04-07 NOTE — Progress Notes (Signed)
? ? ? ?Chief Complaint:  ? ?OBESITY ?Travis Huff is here to discuss his progress with his obesity treatment plan along with follow-up of his obesity related diagnoses. Travis Huff is on the Category 4 Plan and states he is following his eating plan approximately 85% of the time. Travis Huff states he is not currently exercising. ? ?Today's visit was #: 63 ?Starting weight: 350 lbs ?Starting date: 02/03/2020 ?Today's weight: 227 lbs ?Today's date: 04/04/2021 ?Total lbs lost to date: 123 ?Total lbs lost since last in-office visit: 0 ? ?Interim History: Travis Huff went away to [facility] and did a lot of walking. He didn't eat on the plan the whole time but he made great choices. Also had 2 birthday celebrations with family. Did great to not gain. This Friday, Travis Huff will convert to [employer] (off Ozempic due to insurance coverage). ? ?Subjective:  ? ?1. Essential hypertension ?Travis Huff denies dizziness or headache. He is taking Cardizem, Zestoretic, and Toprol. He denies medication side effects. ? ?2. Other chronic pain ?Travis Huff has chronic back pain and he is taking Cymbalta. He was able to increase his walking recently. He is tolerating it well and his pain has not worsened.  ? ?3. At risk for impaired metabolic function ?Travis Huff is at increased risk for impaired metabolic function due to obesity.  ? ?Assessment/Plan:  ?No orders of the defined types were placed in this encounter. ? ? ?There are no discontinued medications.  ? ?No orders of the defined types were placed in this encounter. ?  ? ?1. Essential hypertension ?Travis Huff's blood pressure is essentually at goal. He will continue his medications and decrease his salt intake.  ? ?2. Other chronic pain ?Travis Huff will continue Cymbalta, and try to walk every other day for 15 minutes. He feels this is achievable. ? ?3. At risk for impaired metabolic function ?Travis Huff was given approximately 15 minutes of impaired  metabolic function prevention counseling today. We discussed intensive lifestyle modifications  today with an emphasis on specific nutrition and exercise instructions and strategies.  ? ?Repetitive spaced learning was employed today to elicit superior memory formation and behavioral change. ? ?4. Obesity with current BMI of 31.7 ?Travis Huff is currently in the action stage of change. As such, his goal is to continue with weight loss efforts. He has agreed to the Category 4 Plan.  ? ?Travis Huff will convert to [facility], and medication counseling was done. ? ?Exercise goals: Start walking every other week.  ? ?Behavioral modification strategies: keeping healthy foods in the home and planning for success. ? ?Travis Huff has agreed to follow-up with our clinic in 4 to 5 weeks. He was informed of the importance of frequent follow-up visits to maximize his success with intensive lifestyle modifications for his multiple health conditions.  ? ?Objective:  ? ?Blood pressure 122/88, pulse 73, temperature 98.3 ?F (36.8 ?C), height '5\' 11"'$  (1.803 m), weight 227 lb (103 kg), SpO2 97 %. ?Body mass index is 31.66 kg/m?. ? ?General: Cooperative, alert, well developed, in no acute distress. ?HEENT: Conjunctivae and lids unremarkable. ?Cardiovascular: Regular rhythm.  ?Lungs: Normal work of breathing. ?Neurologic: No focal deficits.  ? ?Lab Results  ?Component Value Date  ? CREATININE 0.81 01/10/2021  ? BUN 20 01/10/2021  ? NA 141 01/10/2021  ? K 4.2 01/10/2021  ? CL 100 01/10/2021  ? CO2 24 01/10/2021  ? ?Lab Results  ?Component Value Date  ? ALT 23 01/10/2021  ? AST 33 01/10/2021  ? ALKPHOS 91 01/10/2021  ? BILITOT 0.6 01/10/2021  ? ?Lab Results  ?  Component Value Date  ? HGBA1C 5.4 01/10/2021  ? HGBA1C 5.5 09/16/2020  ? HGBA1C 5.8 (H) 05/28/2020  ? HGBA1C 6.0 (H) 02/03/2020  ? HGBA1C 5.6 06/19/2018  ? ?Lab Results  ?Component Value Date  ? INSULIN 10.3 02/21/2021  ? INSULIN 17.2 02/03/2020  ? ?Lab Results  ?Component Value Date  ? TSH 2.440 02/21/2021  ? ?Lab Results  ?Component Value Date  ? CHOL 128 02/21/2021  ? HDL 62 02/21/2021  ? [place]  52 02/21/2021  ? TRIG 68 02/21/2021  ? CHOLHDL 2.1 02/21/2021  ? ?Lab Results  ?Component Value Date  ? VD25OH 46.4 01/10/2021  ? VD25OH 60.9 08/30/2020  ? VD25OH 26.1 (L) 02/03/2020  ? ?Lab Results  ?Component Value Date  ? WBC 5.8 02/21/2021  ? HGB 16.4 02/21/2021  ? HCT 46.4 02/21/2021  ? MCV 94 02/21/2021  ? PLT 192 02/21/2021  ? ?No results found for: IRON, TIBC, FERRITIN ? ?Attestation Statements:  ? ?Reviewed by clinician on day of visit: allergies, medications, problem list, medical history, surgical history, family history, social history, and previous encounter notes. ? ? ?I, Trixie Dredge, am acting as transcriptionist for [employer], DO. ? ?I have reviewed the above documentation for accuracy and completeness, and I agree with the above. Marjory Sneddon, D.O. ? ?The [place] was signed into law in 2016 which includes the topic of electronic health records.  This provides immediate access to information in MyChart.  This includes consultation notes, operative notes, office notes, lab results and pathology reports.  If you have any questions about what you read please let us know at your next visit so we can discuss your concerns and take corrective action if need be.  We are right here with you. ? ? ?

## 2021-04-13 ENCOUNTER — Other Ambulatory Visit: Payer: Self-pay | Admitting: Sports Medicine

## 2021-04-13 ENCOUNTER — Other Ambulatory Visit: Payer: Self-pay | Admitting: Interventional Cardiology

## 2021-04-13 DIAGNOSIS — F411 Generalized anxiety disorder: Secondary | ICD-10-CM

## 2021-04-14 ENCOUNTER — Other Ambulatory Visit (HOSPITAL_BASED_OUTPATIENT_CLINIC_OR_DEPARTMENT_OTHER): Payer: Self-pay

## 2021-04-14 MED ORDER — ATORVASTATIN CALCIUM 80 MG PO TABS
ORAL_TABLET | ORAL | 0 refills | Status: DC
  Filled 2021-04-14: qty 30, 30d supply, fill #0

## 2021-04-15 ENCOUNTER — Other Ambulatory Visit (HOSPITAL_BASED_OUTPATIENT_CLINIC_OR_DEPARTMENT_OTHER): Payer: Self-pay

## 2021-04-15 MED ORDER — DULOXETINE HCL 60 MG PO CPEP
ORAL_CAPSULE | ORAL | 0 refills | Status: DC
  Filled 2021-04-15: qty 90, 90d supply, fill #0

## 2021-04-18 ENCOUNTER — Other Ambulatory Visit (HOSPITAL_BASED_OUTPATIENT_CLINIC_OR_DEPARTMENT_OTHER): Payer: Self-pay

## 2021-04-29 ENCOUNTER — Encounter (INDEPENDENT_AMBULATORY_CARE_PROVIDER_SITE_OTHER): Payer: Self-pay | Admitting: Family Medicine

## 2021-04-30 NOTE — Progress Notes (Signed)
?Cardiology Office Note:   ? ?Date:  05/02/2021  ? ?ID:  Travis Huff, DOB 27-Jun-1960, MRN [id number] ? ?PCP:  Silverio Decamp, MD  ?Cardiologist:  Sinclair Grooms, MD  ? ?Referring MD: Silverio Decamp,*  ? ?Chief Complaint  ?Patient presents with  ? Follow-up  ?  Paroxysmal atrial fibrillation CAD without angina ?Primary hypertension ?Chronic anticoagulation ?  ? ? ?History of Present Illness:   ? ?Travis Huff is a 61 y.o. male with a hx of  anxiety, HTN, obesity, multiple PE's (on apixaban and s/p IVC filter prior to orthopedic surgery--> filter to be removed), NSTEMI, probable CA dissection, PAF on Multaq, and chronic combined diastolic and systolic HF due to ischemic cardiomyopathy (EF 40-45%)-> IMPROVED TO NORMAL 2021. Subsequently had Loop recorder placed by Dr. Curt Bears 07/28/20 with no AF thru 03/25/2021. ?  ?He is doing well.  He has had dramatic weight improvement at [facility].  He is on semaglutide. ? ?Since last seeing him, a loop recorder has been placed.  I reviewed the accumulated data and no instances of atrial fibrillation have been identified.  Dr. Curt Bears is the EP physician. ? ?No chest discomfort, orthopnea, PND, or claudication.  He has dramatically increased exertional activities. ? ?Past Medical History:  ?Diagnosis Date  ? Arrhythmia   ? Arthritis   ? shoulders, knees,  back,hips  ? Atrial fibrillation ([place])   ? Avascular necrosis of bone of left hip ([place])   ? Back pain   ? Balance problem   ? Blood clot in vein 06/2016  ? x 2 no bleeding disorders found after back surgery  ? Complication of anesthesia   ? ketamine hallucinations  ? Constipation   ? Coronary artery disease   ? Dislocated hip, left, initial encounter ([place]) 10/03/2018  ? Dysrhythmia 2020  ? a-fib  ? Hx of blood clots   ? Hypertension   ? Implantable loop recorder present   ? Joint pain   ? Neuromuscular disorder ([place])   ? neuropathy bi lat legs knee down  ? Neuropathy   ? PAF  (paroxysmal atrial fibrillation) ([place])   ? Pre-diabetes   ? Prediabetes   ? Shakes   ? SOBOE (shortness of breath on exertion)   ? Swelling of both lower extremities   ? Viral pericarditis 20 yrs ago  ? ? ?Past Surgical History:  ?Procedure Laterality Date  ? ABDOMINAL EXPOSURE N/A 06/23/2016  ? Procedure: ABDOMINAL EXPOSURE;  Surgeon: Rosetta Posner, MD;  Location: [facility] OR;  Service: Vascular;  Laterality: N/A;  ? ANTERIOR LATERAL LUMBAR FUSION 4 LEVELS Right 06/23/2016  ? Procedure: Right  Lumbar two-three Lumbar three-four Lumbar four-five Anterolateral lumbar interbody fusion;  Surgeon: Erline Levine, MD;  Location: [place];  Service: Neurosurgery;  Laterality: Right;  ? ANTERIOR LUMBAR FUSION N/A 06/23/2016  ? Procedure: Lumbar five -sacral one  Anterior lumbar interbody fusion with Dr. Sherren Mocha Early for approach;  Surgeon: Erline Levine, MD;  Location: [place];  Service: Neurosurgery;  Laterality: N/A;  ? APPLICATION OF INTRAOPERATIVE CT SCAN N/A 06/26/2016  ? Procedure: APPLICATION OF INTRAOPERATIVE CT SCAN;  Surgeon: Erline Levine, MD;  Location: [place];  Service: Neurosurgery;  Laterality: N/A;  ? BACK SURGERY  2018  ? CARDIAC CATHETERIZATION    ? CARDIOVASCULAR STRESS TEST    ? Negative in May 2014  ? COLONOSCOPY    ? HERNIA REPAIR    ? umbilical  ? HIP  CLOSED REDUCTION Left 10/03/2018  ? Procedure: CLOSED REDUCTION HIP;  Surgeon: Rod Can, MD;  Location: [facility];  Service: Orthopedics;  Laterality: Left;  ? IR IVC FILTER PLMT / S&I /IMG GUID/MOD SED  03/26/2018  ? IR RADIOLOGIST EVAL & MGMT  04/16/2019  ? JOINT REPLACEMENT    ? LEFT HEART CATH AND CORONARY ANGIOGRAPHY N/A 06/21/2018  ? Procedure: LEFT HEART CATH AND CORONARY ANGIOGRAPHY;  Surgeon: Jettie Booze, MD;  Location: [place] [facility];  Service: Cardiovascular;  Laterality: N/A;  ? POSTERIOR LUMBAR FUSION 4 LEVEL N/A 06/26/2016  ? Procedure: Thoracic ten to Pelvis fixation with AIRO;  Surgeon: Erline Levine, MD;  Location: [place];  Service:  Neurosurgery;  Laterality: N/A;  ? REVERSE SHOULDER ARTHROPLASTY Right 09/18/2019  ? Procedure: REVERSE SHOULDER ARTHROPLASTY;  Surgeon: Justice Britain, MD;  Location: [facility];  Service: Orthopedics;  Laterality: Right;  147mn  ? REVERSE SHOULDER ARTHROPLASTY Left 06/10/2020  ? Procedure: REVERSE SHOULDER ARTHROPLASTY;  Surgeon: SJustice Britain MD;  Location: [facility];  Service: Orthopedics;  Laterality: Left;  1278m  ? TENDON REPAIR    ? right hand  ? TOTAL HIP ARTHROPLASTY Right 05/25/2014  ? Procedure: RIGHT TOTAL HIP ARTHROPLASTY ANTERIOR APPROACH;  Surgeon: BrRod CanMD;  Location[facility] Service: Orthopedics;  Laterality: Right;  ? TOTAL HIP ARTHROPLASTY Left 03/27/2018  ? Procedure: TOTAL HIP ARTHROPLASTY ANTERIOR APPROACH- DA complex;  Surgeon: SwRod CanMD;  [facility];  Service: Orthopedics;  Laterality: Left;  ? TOTAL KNEE ARTHROPLASTY Bilateral   ? TOTAL SHOULDER REVISION Left 10/14/2020  ? Procedure: Revision Left reverse shoulder arthroplasty;  Surgeon: SuJustice BritainMD;  [facility]: [facility];  Service: Orthopedics;  Laterality: Left;  12064m ? ? ?Current Medications: ?Current Meds  ?Medication Sig  ? acetaminophen (TYLENOL) 500 MG tablet Take 1,000 mg by mouth every 6 (six) hours as needed for mild pain or headache.  ? apixaban (ELIQUIS) 5 MG TABS tablet TAKE 1 TABLET BY MOUTH TWICE DAILY  ? atorvastatin (LIPITOR) 80 MG tablet TAKE 1 TABLET BY MOUTH DAILY AT 6PM  ? Cyanocobalamin (B-12) 500 MCG TABS 300 mcg- 500 mcg qd B12  ? cyclobenzaprine (FLEXERIL) 10 MG tablet Take 1 tablet (10 mg total) by mouth 3 (three) times daily as needed for muscle spasms.  ? diltiazem (CARDIZEM CD) 120 MG 24 hr capsule TAKE 1 CAPSULE (120 MG TOTAL) BY MOUTH DAILY.  ? docusate sodium (COLACE) 100 MG capsule Take 1 capsule (100 mg total) by mouth 2 (two) times daily.  ? dronedarone (MULTAQ) 400 MG tablet TAKE 1 TABLET BY MOUTH TWICE DAILY WITH A MEAL  ? DULoxetine (CYMBALTA) 60 MG capsule TAKE 1 CAPSULE (60 MG  TOTAL) BY MOUTH AT BEDTIME.  ? lisinopril-hydrochlorothiazide (ZESTORETIC) 20-25 MG tablet TAKE 1 TABLET BY MOUTH DAILY.  ? metoprolol succinate (TOPROL-XL) 50 MG 24 hr tablet Take 1 tablet (50 mg total) by mouth daily. Take with or immediately following a meal.  ? Semaglutide, 1 MG/DOSE, 4 MG/3ML SOPN Inject 1 mg as directed once a week.  ? sildenafil (VIAGRA) 100 MG tablet Take 1 tablet (100 mg total) by mouth daily as needed. Avoid use with nitroglycerin  ? zolpidem (AMBIEN) 10 MG tablet TAKE 1 TABLET BY MOUTH EVERY NIGHT AT BEDTIME AS NEEDED FOR SLEEP  ?  ? ?Allergies:   Ketamine  ? ?Social History  ? ?Socioeconomic History  ? Marital status: Married  ?  Spouse name: JulGregary Signsody" RobMchugh Number  of children: 2  ? Years of education: 93  ? Highest education level: Not on file  ?Occupational History  ? Occupation: Chiropractor  ?  Comment: [phone]  ?Tobacco Use  ? Smoking status: Former  ?  Packs/day: 1.00  ?  Years: 25.00  ?  Pack years: 25.00  ?  Types: Cigarettes  ?  Quit date: 2000  ?  Years since quitting: 23.3  ? Smokeless tobacco: Current  ?  Types: Snuff  ?Vaping Use  ? Vaping Use: Never used  ?Substance and Sexual Activity  ? Alcohol use: Yes  ?  Alcohol/week: 14.0 standard drinks  ?  Types: 14 Cans of beer per week  ?  Comment: 2 beers QD  ? Drug use: No  ? Sexual activity: Not on file  ?Other Topics Concern  ? Not on file  ?Social History Narrative  ? Lives with wife in a one story home.  Has 2 children.  Works as a Public relations account executive.  Education: high school.   ? ?Social Determinants of Health  ? ?Financial Resource Strain: Not on file  ?Food Insecurity: Not on file  ?Transportation Needs: Not on file  ?Physical Activity: Not on file  ?Stress: Not on file  ?Social Connections: Not on file  ?  ? ?Family History: ?The patient's family history includes Healthy in his daughter and daughter. He was adopted. ? ?ROS:   ?Please see the history of present illness.    ?Needs to be cleared for  colonoscopy.  All other systems reviewed and are negative. ? ?EKGs/Labs/Other Studies Reviewed:   ? ?The following studies were reviewed today: ?No new imaging ?Since having the loop recorder placed, no atrial fibrillation has been

## 2021-05-02 ENCOUNTER — Encounter: Payer: Self-pay | Admitting: Interventional Cardiology

## 2021-05-02 ENCOUNTER — Ambulatory Visit (INDEPENDENT_AMBULATORY_CARE_PROVIDER_SITE_OTHER): Payer: 59

## 2021-05-02 ENCOUNTER — Other Ambulatory Visit (INDEPENDENT_AMBULATORY_CARE_PROVIDER_SITE_OTHER): Payer: Self-pay | Admitting: Bariatrics

## 2021-05-02 ENCOUNTER — Other Ambulatory Visit (HOSPITAL_BASED_OUTPATIENT_CLINIC_OR_DEPARTMENT_OTHER): Payer: Self-pay

## 2021-05-02 ENCOUNTER — Ambulatory Visit: Payer: 59 | Admitting: Interventional Cardiology

## 2021-05-02 VITALS — BP 102/82 | HR 62 | Ht 71.0 in | Wt 232.4 lb

## 2021-05-02 DIAGNOSIS — I2510 Atherosclerotic heart disease of native coronary artery without angina pectoris: Secondary | ICD-10-CM

## 2021-05-02 DIAGNOSIS — E785 Hyperlipidemia, unspecified: Secondary | ICD-10-CM

## 2021-05-02 DIAGNOSIS — R0683 Snoring: Secondary | ICD-10-CM

## 2021-05-02 DIAGNOSIS — I480 Paroxysmal atrial fibrillation: Secondary | ICD-10-CM

## 2021-05-02 DIAGNOSIS — Z01818 Encounter for other preprocedural examination: Secondary | ICD-10-CM | POA: Diagnosis not present

## 2021-05-02 DIAGNOSIS — Z7901 Long term (current) use of anticoagulants: Secondary | ICD-10-CM | POA: Diagnosis not present

## 2021-05-02 DIAGNOSIS — D6869 Other thrombophilia: Secondary | ICD-10-CM | POA: Diagnosis not present

## 2021-05-02 DIAGNOSIS — I10 Essential (primary) hypertension: Secondary | ICD-10-CM

## 2021-05-02 DIAGNOSIS — R7303 Prediabetes: Secondary | ICD-10-CM

## 2021-05-02 LAB — CUP PACEART REMOTE DEVICE CHECK
Date Time Interrogation Session: 20230430230835
Implantable Pulse Generator Implant Date: 20220727

## 2021-05-02 MED ORDER — WEGOVY 1 MG/0.5ML ~~LOC~~ SOAJ
1.0000 mg | SUBCUTANEOUS | 0 refills | Status: DC
  Filled 2021-05-02: qty 2, 28d supply, fill #0

## 2021-05-02 NOTE — Telephone Encounter (Signed)
WEGOV[facility] ? ?LAST APPOINTMENT DATE: 04/04/21 ?NEXT APPOINTMENT DATE: 05/06/21 ? ? ?[facility] ?[address], [place] [address] ?Phone: [phone] Fax: [phone] ? ?Patient is requesting a refill of the following medications: ?No prescriptions requested or ordered in this encounter ? ? ?Date last filled: 02/21/21 ?Previously prescribed by Dr.Opalski ? ?Lab Results ?     Component                Value               Date                 ?     HGBA1C                   5.4                 01/10/2021           ?     HGBA1C                   5.5                 09/16/2020           ?     HGBA1C                   5.8 (H)             05/28/2020           ?Lab Results ?     Component                Value               Date                 ?     [place]                  52                  02/21/2021           ?     CREATININE               0.81                01/10/2021           ?Lab Results ?     Component                Value               Date                 ?     VD25OH                   46.4                01/10/2021           ?     VD25OH                   60.9                08/30/2020           ?     [place]  26.1 (L)            02/03/2020           ? ?BP Readings from Last 3 Encounters: ?04/04/21 : 122/88 ?03/14/21 : 134/88 ?02/21/21 : 138/87 ?

## 2021-05-02 NOTE — Patient Instructions (Signed)

## 2021-05-03 ENCOUNTER — Other Ambulatory Visit (HOSPITAL_BASED_OUTPATIENT_CLINIC_OR_DEPARTMENT_OTHER): Payer: Self-pay

## 2021-05-09 ENCOUNTER — Ambulatory Visit (INDEPENDENT_AMBULATORY_CARE_PROVIDER_SITE_OTHER): Payer: 59 | Admitting: Family Medicine

## 2021-05-09 ENCOUNTER — Encounter (INDEPENDENT_AMBULATORY_CARE_PROVIDER_SITE_OTHER): Payer: Self-pay | Admitting: Family Medicine

## 2021-05-09 VITALS — BP 138/79 | HR 71 | Temp 98.3°F | Ht 71.0 in | Wt 224.0 lb

## 2021-05-09 DIAGNOSIS — E669 Obesity, unspecified: Secondary | ICD-10-CM | POA: Diagnosis not present

## 2021-05-09 DIAGNOSIS — I10 Essential (primary) hypertension: Secondary | ICD-10-CM | POA: Diagnosis not present

## 2021-05-09 DIAGNOSIS — Z9189 Other specified personal risk factors, not elsewhere classified: Secondary | ICD-10-CM

## 2021-05-09 DIAGNOSIS — R7303 Prediabetes: Secondary | ICD-10-CM

## 2021-05-09 DIAGNOSIS — G894 Chronic pain syndrome: Secondary | ICD-10-CM | POA: Diagnosis not present

## 2021-05-09 DIAGNOSIS — Z6831 Body mass index (BMI) 31.0-31.9, adult: Secondary | ICD-10-CM | POA: Diagnosis not present

## 2021-05-15 ENCOUNTER — Other Ambulatory Visit (HOSPITAL_COMMUNITY): Payer: Self-pay | Admitting: Physician Assistant

## 2021-05-16 ENCOUNTER — Other Ambulatory Visit (HOSPITAL_BASED_OUTPATIENT_CLINIC_OR_DEPARTMENT_OTHER): Payer: Self-pay

## 2021-05-16 NOTE — Progress Notes (Signed)
[facility] Summary Report / Loop Recorder 

## 2021-05-17 ENCOUNTER — Other Ambulatory Visit (HOSPITAL_BASED_OUTPATIENT_CLINIC_OR_DEPARTMENT_OTHER): Payer: Self-pay

## 2021-05-17 ENCOUNTER — Ambulatory Visit (INDEPENDENT_AMBULATORY_CARE_PROVIDER_SITE_OTHER): Payer: 59

## 2021-05-17 ENCOUNTER — Ambulatory Visit: Payer: 59 | Admitting: Sports Medicine

## 2021-05-17 DIAGNOSIS — M1811 Unilateral primary osteoarthritis of first carpometacarpal joint, right hand: Secondary | ICD-10-CM | POA: Diagnosis not present

## 2021-05-17 DIAGNOSIS — M19041 Primary osteoarthritis, right hand: Secondary | ICD-10-CM | POA: Diagnosis not present

## 2021-05-17 DIAGNOSIS — M19032 Primary osteoarthritis, left wrist: Secondary | ICD-10-CM

## 2021-05-17 DIAGNOSIS — F5101 Primary insomnia: Secondary | ICD-10-CM

## 2021-05-17 DIAGNOSIS — M19031 Primary osteoarthritis, right wrist: Secondary | ICD-10-CM | POA: Insufficient documentation

## 2021-05-17 DIAGNOSIS — M65311 Trigger thumb, right thumb: Secondary | ICD-10-CM | POA: Diagnosis not present

## 2021-05-17 DIAGNOSIS — M25531 Pain in right wrist: Secondary | ICD-10-CM | POA: Diagnosis not present

## 2021-05-17 DIAGNOSIS — Z0000 Encounter for general adult medical examination without abnormal findings: Secondary | ICD-10-CM

## 2021-05-17 MED ORDER — CYCLOBENZAPRINE HCL 10 MG PO TABS
10.0000 mg | ORAL_TABLET | Freq: Every day | ORAL | 3 refills | Status: DC
  Filled 2021-05-17: qty 90, 90d supply, fill #0
  Filled 2021-07-18: qty 90, 90d supply, fill #1

## 2021-05-17 MED ORDER — DILTIAZEM HCL ER COATED BEADS 120 MG PO CP24
ORAL_CAPSULE | Freq: Every day | ORAL | 3 refills | Status: DC
  Filled 2021-05-17: qty 90, 90d supply, fill #0
  Filled 2021-08-17: qty 90, 90d supply, fill #1
  Filled 2021-11-14: qty 90, 90d supply, fill #2
  Filled 2022-02-19: qty 90, 90d supply, fill #3

## 2021-05-17 NOTE — Assessment & Plan Note (Signed)
Due for colonoscopy, this was placed back in August 2022, does not look like its been done, we will revisit this at his follow-up visit ?

## 2021-05-17 NOTE — Assessment & Plan Note (Signed)
Severe pain left radiocarpal joint, x-rays from 2019 showed significant positive ulnar variance and osteoarthritis. ?Per patient request we injected his radiocarpal joint today, I would like updated x-rays, return to see me in 4 to 6 weeks. ?

## 2021-05-17 NOTE — Progress Notes (Signed)
? ? ?  Procedures performed today:   ? ?Procedure: Real-time Ultrasound Guided injection of the left radiocarpal joint ?Device: Samsung HS60  ?Verbal informed consent obtained.  ?Time-out conducted.  ?Noted no overlying erythema, induration, or other signs of local infection.  ?Skin prepped in a sterile fashion.  ?Local anesthesia: Topical Ethyl chloride.  ?With sterile technique and under real time ultrasound guidance: Noted arthritic joint, 1 cc lidocaine, 1 cc bupivacaine, 1 cc kenalog 40 injected easily. ?Completed without difficulty  ?Advised to call if fevers/chills, erythema, induration, drainage, or persistent bleeding.  ?Images permanently stored and available for review in PACS.  ?Impression: Technically successful ultrasound guided injection. ? ?Procedure: Real-time Ultrasound Guided injection of the right first MCP ?Device: Samsung HS60  ?Verbal informed consent obtained.  ?Time-out conducted.  ?Noted no overlying erythema, induration, or other signs of local infection.  ?Skin prepped in a sterile fashion.  ?Local anesthesia: Topical Ethyl chloride.  ?With sterile technique and under real time ultrasound guidance: 1 cc lidocaine, 1/2 cc kenalog 40 injected easily. ?Completed without difficulty  ?Advised to call if fevers/chills, erythema, induration, drainage, or persistent bleeding.  ?Images permanently stored and available for review in PACS.  ?Impression: Technically successful ultrasound guided injection. ? ?Independent interpretation of notes and tests performed by another provider:  ? ?None. ? ?Brief History, Exam, Impression, and Recommendations:   ? ?Primary osteoarthritis, right hand, first MCP ?Somewhat of a dystrophic appearance of his thumb on exam, he does complain of pain at the MCP. ?Injection as above, x-rays, home conditioning given, return to see me in 6 weeks. ? ?Primary osteoarthritis of left wrist ?Severe pain left radiocarpal joint, x-rays from 2019 showed significant positive ulnar  variance and osteoarthritis. ?Per patient request we injected his radiocarpal joint today, I would like updated x-rays, return to see me in 4 to 6 weeks. ? ?Insomnia ?Continue Ambien, adding some Flexeril. ? ?Annual physical exam ?Due for colonoscopy, this was placed back in August 2022, does not look like its been done, we will revisit this at his follow-up visit ? ? ?___________________________________________ ?Gwen Her. Dianah Field, M.D., ABFM., CAQSM. ?Primary Care and Sports Medicine ?[place] ? ?Adjunct Instructor of Family Medicine  ?[employer] ?

## 2021-05-17 NOTE — Assessment & Plan Note (Addendum)
Somewhat of a dystrophic appearance of his thumb on exam, he does complain of pain at the MCP. ?Injection as above, x-rays, home conditioning given, return to see me in 6 weeks. ?

## 2021-05-17 NOTE — Assessment & Plan Note (Signed)
Continue Ambien, adding some Flexeril. ?

## 2021-05-18 ENCOUNTER — Ambulatory Visit: Payer: 59 | Admitting: Sports Medicine

## 2021-05-20 ENCOUNTER — Other Ambulatory Visit: Payer: Self-pay | Admitting: Sports Medicine

## 2021-05-20 ENCOUNTER — Other Ambulatory Visit: Payer: Self-pay | Admitting: Interventional Cardiology

## 2021-05-20 DIAGNOSIS — I10 Essential (primary) hypertension: Secondary | ICD-10-CM

## 2021-05-20 NOTE — Progress Notes (Signed)
Chief Complaint:   OBESITY Travis Huff is here to discuss his progress with his obesity treatment plan along with follow-up of his obesity related diagnoses. [name] is on the Category 4 Plan and states he is following his eating plan approximately 85% of the time. [name] states he is walking 1/3 of a mile 4-5  times per week.  Today's visit was #: 23 Starting weight: 350 lbs Starting date: 02/03/2020 Today's weight: 224 lbs Today's date: 05/09/2021 Total lbs lost to date: 126 lbs Total lbs lost since last in-office visit: 3 lbs  Interim History: [name] was at [employer] for 6 days of vacation. He did great with his choices and still lost weight. [name] has been walking 4-5 days a week, without his cane. [name] is very happy with his progress. He also had birthday celebrations and still lost weight.  Subjective:   1. Essential hypertension [name] recently had an appointment with his Cardiologist who was very excited about his weight loss since his last appointment.  2. Pre-diabetes [name] is tolerating meds well with no side effects. His hunger and cravings are well controlled and he declines the need for a dose adjustment of Wegovy.  3. Chronic pain syndrome [name] has chronic back and neuropathy of lower extremities and an unsteady gait due to lower back surgeries. He used to work out and Psychologist, sport and exercise at [employer].  4. At risk for activity intolerance [name] is at an increased risk for activity intolerance due to Ldlt physical limitations.  Assessment/Plan:  No orders of the defined types were placed in this encounter.   Medications Discontinued During This Encounter  Medication Reason   cyclobenzaprine (FLEXERIL) 10 MG tablet Patient Preference     No orders of the defined types were placed in this encounter.    1. Essential hypertension [name] agreed to check his blood pressure at home every other day or every 3rd day and write it down along with his pulse readings, and bring with him  to the next office visit. He desires to decrease his meds if possible, [name] will continue to decrease his salt intake and increase exercise and continue with weight loss.  2. Pre-diabetes [name] will continue his Wegovy 1 mg weekly and denies a refill at this time.  3. Chronic pain syndrome [name] was encouraged  to follow up with his chronic pain Doctor if not continually to improve. He is to start back with resistance training at the gym.  4. At risk for activity intolerance [name] was given approximately 9 minutes of exercise intolerance counseling today. He is 61 y.o. male and has risk factors exercise intolerance including obesity. We discussed intensive lifestyle modifications today with an emphasis on specific weight loss instructions and strategies. [name] will slowly increase activity as tolerated.  Repetitive spaced learning was employed today to elicit superior memory formation and behavioral change.   5. Obesity with current BMI of 31.3 [name] is currently in the action stage of change. As such, his goal is to continue with weight loss efforts. He has agreed to the Category 4 Plan.   Exercise goals: As is and add weight lifting.  Behavioral modification strategies: keeping healthy foods in the home, avoiding temptations, and planning for success.  [name] has agreed to follow-up with our clinic in 4 weeks. He was informed of the importance of frequent follow-up visits to maximize his success with intensive lifestyle modifications for his multiple health conditions.   Objective:   Blood pressure 138/79, pulse  71, temperature 98.3 F (36.8 C), height '5\' 11"'$  (1.803 m), weight 224 lb (101.6 kg), SpO2 97 %. Body mass index is 31.24 kg/m.  General: Cooperative, alert, well developed, in no acute distress. HEENT: Conjunctivae and lids unremarkable. Cardiovascular: Regular rhythm.  Lungs: Normal work of breathing. Neurologic: No focal deficits.   Lab Results  Component Value Date    CREATININE 0.81 01/10/2021   BUN 20 01/10/2021   NA 141 01/10/2021   K 4.2 01/10/2021   CL 100 01/10/2021   CO2 24 01/10/2021   Lab Results  Component Value Date   ALT 23 01/10/2021   AST 33 01/10/2021   ALKPHOS 91 01/10/2021   BILITOT 0.6 01/10/2021   Lab Results  Component Value Date   HGBA1C 5.4 01/10/2021   HGBA1C 5.5 09/16/2020   HGBA1C 5.8 (H) 05/28/2020   HGBA1C 6.0 (H) 02/03/2020   HGBA1C 5.6 06/19/2018   Lab Results  Component Value Date   INSULIN 10.3 02/21/2021   INSULIN 17.2 02/03/2020   Lab Results  Component Value Date   TSH 2.440 02/21/2021   Lab Results  Component Value Date   CHOL 128 02/21/2021   HDL 62 02/21/2021   LDLCALC 52 02/21/2021   TRIG 68 02/21/2021   CHOLHDL 2.1 02/21/2021   Lab Results  Component Value Date   VD25OH 46.4 01/10/2021   VD25OH 60.9 08/30/2020   VD25OH 26.1 (L) 02/03/2020   Lab Results  Component Value Date   WBC 5.8 02/21/2021   HGB 16.4 02/21/2021   HCT 46.4 02/21/2021   MCV 94 02/21/2021   PLT 192 02/21/2021   No results found for: IRON, TIBC, FERRITIN  Attestation Statements:   Reviewed by clinician on day of visit: allergies, medications, problem list, medical history, surgical history, family history, social history, and previous encounter notes.  ILennette Bihari, CMA, am acting as transcriptionist for Dr. Raliegh Scarlet, DO.  I have reviewed the above documentation for accuracy and completeness, and I agree with the above. Marjory Sneddon, D.O.  The [place] was signed into law in 2016 which includes the topic of electronic health records.  This provides immediate access to information in MyChart.  This includes consultation notes, operative notes, office notes, lab results and pathology reports.  If you have any questions about what you read please let us know at your next visit so we can discuss your concerns and take corrective action if need be.  We are right here with you.

## 2021-05-23 ENCOUNTER — Other Ambulatory Visit (HOSPITAL_BASED_OUTPATIENT_CLINIC_OR_DEPARTMENT_OTHER): Payer: Self-pay

## 2021-05-23 MED ORDER — ATORVASTATIN CALCIUM 80 MG PO TABS
ORAL_TABLET | ORAL | 3 refills | Status: DC
  Filled 2021-05-23: qty 90, 90d supply, fill #0
  Filled 2021-08-17: qty 90, 90d supply, fill #1
  Filled 2021-11-14: qty 90, 90d supply, fill #2
  Filled 2022-02-19: qty 90, 90d supply, fill #3

## 2021-05-23 MED ORDER — LISINOPRIL-HYDROCHLOROTHIAZIDE 20-25 MG PO TABS
1.0000 | ORAL_TABLET | Freq: Every day | ORAL | 0 refills | Status: DC
  Filled 2021-05-23: qty 90, 90d supply, fill #0

## 2021-05-31 ENCOUNTER — Encounter (INDEPENDENT_AMBULATORY_CARE_PROVIDER_SITE_OTHER): Payer: Self-pay | Admitting: Family Medicine

## 2021-05-31 ENCOUNTER — Encounter (INDEPENDENT_AMBULATORY_CARE_PROVIDER_SITE_OTHER): Payer: Self-pay

## 2021-05-31 ENCOUNTER — Ambulatory Visit (INDEPENDENT_AMBULATORY_CARE_PROVIDER_SITE_OTHER): Payer: 59 | Admitting: Family Medicine

## 2021-06-01 ENCOUNTER — Encounter: Payer: Self-pay | Admitting: Sports Medicine

## 2021-06-01 NOTE — Telephone Encounter (Signed)
Dr.Opalski ?

## 2021-06-06 ENCOUNTER — Other Ambulatory Visit (HOSPITAL_BASED_OUTPATIENT_CLINIC_OR_DEPARTMENT_OTHER): Payer: Self-pay

## 2021-06-06 ENCOUNTER — Ambulatory Visit (INDEPENDENT_AMBULATORY_CARE_PROVIDER_SITE_OTHER): Payer: 59

## 2021-06-06 ENCOUNTER — Ambulatory Visit (INDEPENDENT_AMBULATORY_CARE_PROVIDER_SITE_OTHER): Payer: 59 | Admitting: Family Medicine

## 2021-06-06 ENCOUNTER — Encounter (INDEPENDENT_AMBULATORY_CARE_PROVIDER_SITE_OTHER): Payer: Self-pay | Admitting: Family Medicine

## 2021-06-06 VITALS — BP 149/95 | HR 75 | Temp 98.3°F | Ht 71.0 in | Wt 224.0 lb

## 2021-06-06 DIAGNOSIS — E669 Obesity, unspecified: Secondary | ICD-10-CM

## 2021-06-06 DIAGNOSIS — R7303 Prediabetes: Secondary | ICD-10-CM

## 2021-06-06 DIAGNOSIS — Z7985 Long-term (current) use of injectable non-insulin antidiabetic drugs: Secondary | ICD-10-CM

## 2021-06-06 DIAGNOSIS — I10 Essential (primary) hypertension: Secondary | ICD-10-CM

## 2021-06-06 DIAGNOSIS — Z6831 Body mass index (BMI) 31.0-31.9, adult: Secondary | ICD-10-CM

## 2021-06-06 DIAGNOSIS — I480 Paroxysmal atrial fibrillation: Secondary | ICD-10-CM | POA: Diagnosis not present

## 2021-06-06 MED ORDER — WEGOVY 1 MG/0.5ML ~~LOC~~ SOAJ
1.0000 mg | SUBCUTANEOUS | 0 refills | Status: DC
  Filled 2021-06-06: qty 2, 28d supply, fill #0

## 2021-06-07 ENCOUNTER — Encounter: Payer: Self-pay | Admitting: Interventional Cardiology

## 2021-06-07 DIAGNOSIS — I10 Essential (primary) hypertension: Secondary | ICD-10-CM

## 2021-06-07 LAB — CUP PACEART REMOTE DEVICE CHECK
Date Time Interrogation Session: 20230602230311
Implantable Pulse Generator Implant Date: 20220727

## 2021-06-07 MED ORDER — LISINOPRIL-HYDROCHLOROTHIAZIDE 20-25 MG PO TABS: 0.5000 | ORAL_TABLET | Freq: Every day | ORAL | Status: DC

## 2021-06-09 ENCOUNTER — Encounter (INDEPENDENT_AMBULATORY_CARE_PROVIDER_SITE_OTHER): Payer: Self-pay | Admitting: Family Medicine

## 2021-06-09 ENCOUNTER — Other Ambulatory Visit (HOSPITAL_BASED_OUTPATIENT_CLINIC_OR_DEPARTMENT_OTHER): Payer: Self-pay

## 2021-06-09 NOTE — Telephone Encounter (Signed)
Dr.Opalski ?

## 2021-06-11 NOTE — Progress Notes (Signed)
Chief Complaint:   OBESITY Travis Huff is here to discuss his progress with his obesity treatment plan along with follow-up of his obesity related diagnoses. Travis Huff is on the Category 4 Plan and states he is following his eating plan approximately 85% of the time. Travis Huff states he is walking 25 minutes 5 times per week.  Today's visit was #: 24 Starting weight: 350 lbs Starting date: 02/03/2020 Today's weight: 224 lbs Today's date: 06/06/2021 Total lbs lost to date: 126 Total lbs lost since last in-office visit: 0  Interim History: Travis Huff started walking regularly. He notes his clothes fit better and are appreciably larger on him lately.  Subjective:   1. Essential hypertension Pt's BP at home averages 127/80. He checks his BP once or twice a day.  2. Pre-diabetes Travis Huff has a diagnosis of prediabetes based on his elevated HgA1c and was informed this puts him at greater risk of developing diabetes. He continues to work on diet and exercise to decrease his risk of diabetes. He denies nausea or hypoglycemia.  Assessment/Plan:  No orders of the defined types were placed in this encounter.   Medications Discontinued During This Encounter  Medication Reason   Semaglutide-Weight Management (WEGOVY) 1 MG/0.5ML SOAJ Reorder     Meds ordered this encounter  Medications   DISCONTD: Semaglutide-Weight Management (WEGOVY) 1 MG/0.5ML SOAJ    Sig: Inject 1 mg as directed once a week.    Dispense:  2 mL    Refill:  0    **Intended to be Wegovy (Semaglutide Weight Managment) '1mg'$ .  Not Ozempic (Semaglutide)** 05/03/21     1. Essential hypertension Travis Huff is working on healthy weight loss and exercise to improve blood pressure control. We will watch for signs of hypotension as he continues his lifestyle modifications. BP elevated here today but controlled at home regularly. Continue meds, prudent nutritional plan, decrease salt intake, and increase water intake.  2. Pre-diabetes Travis Huff will  continue to work on weight loss, exercise, and decreasing simple carbohydrates to help decrease the risk of diabetes.   Refill- Semaglutide-Weight Management (WEGOVY) 1 MG/0.5ML SOAJ; Inject 1 mg as directed once a week.  Dispense: 2 mL; Refill: 0  3. Obesity BMI today is 31.3 Travis Huff is currently in the action stage of change. As such, his goal is to continue with weight loss efforts. He has agreed to the Category 4 Plan.   Exercise goals:  As is  Behavioral modification strategies: increasing lean protein intake and decreasing simple carbohydrates.  Travis Huff has agreed to follow-up with our clinic in 3 weeks. He was informed of the importance of frequent follow-up visits to maximize his success with intensive lifestyle modifications for his multiple health conditions.   Objective:   Blood pressure (!) 149/95, pulse 75, temperature 98.3 F (36.8 C), height '5\' 11"'$  (1.803 m), weight 224 lb (101.6 kg), SpO2 97 %. Body mass index is 31.24 kg/m.  General: Cooperative, alert, well developed, in no acute distress. HEENT: Conjunctivae and lids unremarkable. Cardiovascular: Regular rhythm.  Lungs: Normal work of breathing. Neurologic: No focal deficits.   Lab Results  Component Value Date   CREATININE 0.81 01/10/2021   BUN 20 01/10/2021   NA 141 01/10/2021   K 4.2 01/10/2021   CL 100 01/10/2021   CO2 24 01/10/2021   Lab Results  Component Value Date   ALT 23 01/10/2021   AST 33 01/10/2021   ALKPHOS 91 01/10/2021   BILITOT 0.6 01/10/2021   Lab Results  Component Value  Date   HGBA1C 5.4 01/10/2021   HGBA1C 5.5 09/16/2020   HGBA1C 5.8 (H) 05/28/2020   HGBA1C 6.0 (H) 02/03/2020   HGBA1C 5.6 06/19/2018   Lab Results  Component Value Date   INSULIN 10.3 02/21/2021   INSULIN 17.2 02/03/2020   Lab Results  Component Value Date   TSH 2.440 02/21/2021   Lab Results  Component Value Date   CHOL 128 02/21/2021   HDL 62 02/21/2021   LDLCALC 52 02/21/2021   TRIG 68 02/21/2021    CHOLHDL 2.1 02/21/2021   Lab Results  Component Value Date   VD25OH 46.4 01/10/2021   VD25OH 60.9 08/30/2020   VD25OH 26.1 (L) 02/03/2020   Lab Results  Component Value Date   WBC 5.8 02/21/2021   HGB 16.4 02/21/2021   HCT 46.4 02/21/2021   MCV 94 02/21/2021   PLT 192 02/21/2021   No results found for: "IRON", "TIBC", "FERRITIN"  Obesity Behavioral Intervention:   Approximately 15 minutes were spent on the discussion below.  ASK: We discussed the diagnosis of obesity with Travis Huff today and Travis Huff agreed to give [place] permission to discuss obesity behavioral modification therapy today.  ASSESS: Travis Huff has the diagnosis of obesity and his BMI today is 31.3. Travis Huff is in the action stage of change.   ADVISE: Travis Huff was educated on the multiple health risks of obesity as well as the benefit of weight loss to improve his health. He was advised of the need for long term treatment and the importance of lifestyle modifications to improve his current health and to decrease his risk of future health problems.  AGREE: Multiple dietary modification options and treatment options were discussed and Travis Huff agreed to follow the recommendations documented in the above note.  ARRANGE: Travis Huff was educated on the importance of frequent visits to treat obesity as outlined per CMS and USPSTF guidelines and agreed to schedule his next follow up appointment today.  Attestation Statements:   Reviewed by clinician on day of visit: allergies, medications, problem list, medical history, surgical history, family history, social history, and previous encounter notes.  I, Kathlene November, BS, CMA, am acting as transcriptionist for [employer], DO.  I have reviewed the above documentation for accuracy and completeness, and I agree with the above. Marjory Sneddon, D.O.  The [place] was signed into law in 2016 which includes the topic of electronic health records.  This provides immediate  access to information in MyChart.  This includes consultation notes, operative notes, office notes, lab results and pathology reports.  If you have any questions about what you read please let us know at your next visit so we can discuss your concerns and take corrective action if need be.  We are right here with you.

## 2021-06-13 ENCOUNTER — Encounter (INDEPENDENT_AMBULATORY_CARE_PROVIDER_SITE_OTHER): Payer: Self-pay | Admitting: Family Medicine

## 2021-06-13 NOTE — Telephone Encounter (Signed)
Dr.Opalski ?

## 2021-06-14 ENCOUNTER — Other Ambulatory Visit (INDEPENDENT_AMBULATORY_CARE_PROVIDER_SITE_OTHER): Payer: Self-pay

## 2021-06-14 ENCOUNTER — Other Ambulatory Visit (HOSPITAL_BASED_OUTPATIENT_CLINIC_OR_DEPARTMENT_OTHER): Payer: Self-pay

## 2021-06-14 DIAGNOSIS — R7303 Prediabetes: Secondary | ICD-10-CM

## 2021-06-14 MED ORDER — WEGOVY 1.7 MG/0.75ML ~~LOC~~ SOAJ
1.7000 mg | SUBCUTANEOUS | 0 refills | Status: DC
  Filled 2021-06-14: qty 3, 28d supply, fill #0

## 2021-06-14 NOTE — Telephone Encounter (Signed)
Left message for pt to return call.

## 2021-06-15 DIAGNOSIS — K5900 Constipation, unspecified: Secondary | ICD-10-CM | POA: Diagnosis not present

## 2021-06-15 DIAGNOSIS — Z7901 Long term (current) use of anticoagulants: Secondary | ICD-10-CM | POA: Diagnosis not present

## 2021-06-15 DIAGNOSIS — Z1211 Encounter for screening for malignant neoplasm of colon: Secondary | ICD-10-CM | POA: Diagnosis not present

## 2021-06-16 ENCOUNTER — Other Ambulatory Visit (HOSPITAL_BASED_OUTPATIENT_CLINIC_OR_DEPARTMENT_OTHER): Payer: Self-pay

## 2021-06-22 ENCOUNTER — Other Ambulatory Visit (HOSPITAL_BASED_OUTPATIENT_CLINIC_OR_DEPARTMENT_OTHER): Payer: Self-pay

## 2021-06-22 ENCOUNTER — Other Ambulatory Visit (HOSPITAL_COMMUNITY): Payer: Self-pay | Admitting: Cardiology

## 2021-06-22 MED ORDER — APIXABAN 5 MG PO TABS
ORAL_TABLET | Freq: Two times a day (BID) | ORAL | 1 refills | Status: DC
Start: 2021-06-22 — End: 2021-12-18
  Filled 2021-06-22: qty 180, 90d supply, fill #0
  Filled 2021-09-14: qty 180, 90d supply, fill #1

## 2021-06-22 NOTE — Progress Notes (Signed)
[facility] Summary Report / Loop Recorder 

## 2021-06-22 NOTE — Telephone Encounter (Signed)
Prescription refill request for Eliquis received. Indication:Afib Last office visit:5/23 Scr:0.8 Age: 61 Weight:101.6 kg  Prescription refilled

## 2021-06-27 ENCOUNTER — Other Ambulatory Visit (HOSPITAL_BASED_OUTPATIENT_CLINIC_OR_DEPARTMENT_OTHER): Payer: Self-pay

## 2021-06-27 ENCOUNTER — Ambulatory Visit (INDEPENDENT_AMBULATORY_CARE_PROVIDER_SITE_OTHER): Payer: 59 | Admitting: Family Medicine

## 2021-06-27 ENCOUNTER — Encounter (INDEPENDENT_AMBULATORY_CARE_PROVIDER_SITE_OTHER): Payer: Self-pay | Admitting: Family Medicine

## 2021-06-27 VITALS — BP 135/87 | HR 65 | Temp 98.3°F | Ht 71.0 in | Wt 222.0 lb

## 2021-06-27 DIAGNOSIS — E669 Obesity, unspecified: Secondary | ICD-10-CM | POA: Diagnosis not present

## 2021-06-27 DIAGNOSIS — I10 Essential (primary) hypertension: Secondary | ICD-10-CM | POA: Diagnosis not present

## 2021-06-27 DIAGNOSIS — Z6831 Body mass index (BMI) 31.0-31.9, adult: Secondary | ICD-10-CM

## 2021-06-27 DIAGNOSIS — Z9189 Other specified personal risk factors, not elsewhere classified: Secondary | ICD-10-CM

## 2021-06-27 MED ORDER — WEGOVY 1.7 MG/0.75ML ~~LOC~~ SOAJ
1.7000 mg | SUBCUTANEOUS | 0 refills | Status: DC
  Filled 2021-06-27 – 2021-07-06 (×4): qty 3, 28d supply, fill #0

## 2021-06-30 ENCOUNTER — Other Ambulatory Visit (HOSPITAL_BASED_OUTPATIENT_CLINIC_OR_DEPARTMENT_OTHER): Payer: Self-pay

## 2021-07-01 NOTE — Progress Notes (Signed)
Chief Complaint:   OBESITY Travis Huff is here to discuss his progress with his obesity treatment plan along with follow-up of his obesity related diagnoses. Travis Huff is on the Category 4 Plan and states he is following his eating plan approximately 85% of the time. Travis Huff states he is walking 25-30 minutes 4-5 times per week.  Today's visit was #: 25 Starting weight: 350 lbs Starting date: 02/03/2020 Today's weight: 222 lbs Today's date: 06/27/2021 Total lbs lost to date: 128 Total lbs lost since last in-office visit: 2  Interim History: Travis Huff is walking religiously now. He is doing well with meal plan and is without issues or concerns. Pt also denies hunger or cravings. He is wondering how to speed up his weight loss. He is only eating out/off-plan once every 2-3 weeks. Pt is doing great!  Subjective:   1. Essential hypertension Travis Huff sees cardiology and notes from his 06/07/21 visit were reviewed. Cardiology advised pt to continue his same dose of BP meds. His pace maker is checked frequently. Pt denies concerns today. Medication: Cardizem, Zestoretic, Toprol XL  2. At risk for constipation Travis Huff is at risk for constipation due to increasing medication dose, which causes constipation as a side effect.   Assessment/Plan:  No orders of the defined types were placed in this encounter.   Medications Discontinued During This Encounter  Medication Reason   Semaglutide-Weight Management (WEGOVY) 1.7 MG/0.75ML SOAJ Reorder     Meds ordered this encounter  Medications   Semaglutide-Weight Management (WEGOVY) 1.7 MG/0.75ML SOAJ    Sig: Inject 1.7 mg into the skin once a week.    Dispense:  3 mL    Refill:  0     1. Essential hypertension Travis Huff is working on healthy weight loss and exercise to improve blood pressure control. We will watch for signs of hypotension as he continues his lifestyle modifications. Continue BP meds per PCP/cardiology. Continue prudent nutritional plan and decrease  salt intake. Increase exercise as tolerated if approved by cardiology. We will monitor closely along pt's weight loss journey.  2. At risk for constipation Travis Huff is at increased risk for constipation due to inadequate water intake, changes in diet, and/or use of certain medications. Patient was provided with 9 minutes of counseling today regarding this condition and related ones.  We discussed preventative OTC therapies that exist such as MiraLAX, stool softeners, etc.   We also discussed the importance of adequate fiber intake and for patient to drink 1/2 weight in ounces of water per day, unless told otherwise by a Cardiologist, Nephrologist, or another medical provider.    3. Obesity with current BMI of 46 Travis Huff is currently in the action stage of change. As such, his goal is to continue with weight loss efforts. He has agreed to the Category 4 Plan.   Pt had to go from 1 to 1.7 mg Wegovy after his last OV, due to shortage of 1 mg dose. He tolerated it well without side effects or constipation. Pt reports good control of hunger and we will refill for 1 month at 1.7 mg Wegovy.  Refill-Semaglutide-Weight Management (WEGOVY) 1.7 MG/0.75ML SOAJ; Inject 1.7 mg into the skin once a week.  Dispense: 3 mL; Refill: 0  Exercise goals: For substantial health benefits, adults should do at least 150 minutes (2 hours and 30 minutes) a week of moderate-intensity, or 75 minutes (1 hour and 15 minutes) a week of vigorous-intensity aerobic physical activity, or an equivalent combination of moderate- and  vigorous-intensity aerobic activity. Aerobic activity should be performed in episodes of at least 10 minutes, and preferably, it should be spread throughout the week.  Behavioral modification strategies: increasing water intake, ways to avoid boredom eating, better snacking choices, and planning for success.  Leroy has agreed to follow-up with our clinic in 3-4 weeks. He was informed of the importance of  frequent follow-up visits to maximize his success with intensive lifestyle modifications for his multiple health conditions.   Objective:   Blood pressure 135/87, pulse 65, temperature 98.3 F (36.8 C), height '5\' 11"'$  (1.803 m), weight 222 lb (100.7 kg), SpO2 98 %. Body mass index is 30.96 kg/m.  General: Cooperative, alert, well developed, in no acute distress. HEENT: Conjunctivae and lids unremarkable. Cardiovascular: Regular rhythm.  Lungs: Normal work of breathing. Neurologic: No focal deficits.   Lab Results  Component Value Date   CREATININE 0.81 01/10/2021   BUN 20 01/10/2021   NA 141 01/10/2021   K 4.2 01/10/2021   CL 100 01/10/2021   CO2 24 01/10/2021   Lab Results  Component Value Date   ALT 23 01/10/2021   AST 33 01/10/2021   ALKPHOS 91 01/10/2021   BILITOT 0.6 01/10/2021   Lab Results  Component Value Date   HGBA1C 5.4 01/10/2021   HGBA1C 5.5 09/16/2020   HGBA1C 5.8 (H) 05/28/2020   HGBA1C 6.0 (H) 02/03/2020   HGBA1C 5.6 06/19/2018   Lab Results  Component Value Date   INSULIN 10.3 02/21/2021   INSULIN 17.2 02/03/2020   Lab Results  Component Value Date   TSH 2.440 02/21/2021   Lab Results  Component Value Date   CHOL 128 02/21/2021   HDL 62 02/21/2021   LDLCALC 52 02/21/2021   TRIG 68 02/21/2021   CHOLHDL 2.1 02/21/2021   Lab Results  Component Value Date   VD25OH 46.4 01/10/2021   VD25OH 60.9 08/30/2020   VD25OH 26.1 (L) 02/03/2020   Lab Results  Component Value Date   WBC 5.8 02/21/2021   HGB 16.4 02/21/2021   HCT 46.4 02/21/2021   MCV 94 02/21/2021   PLT 192 02/21/2021    Attestation Statements:   Reviewed by clinician on day of visit: allergies, medications, problem list, medical history, surgical history, family history, social history, and previous encounter notes.  I, Kathlene November, BS, CMA, am acting as transcriptionist for [employer], DO.   I have reviewed the above documentation for accuracy and completeness,  and I agree with the above. Travis Huff, D.O.  The [place] was signed into law in 2016 which includes the topic of electronic health records.  This provides immediate access to information in MyChart.  This includes consultation notes, operative notes, office notes, lab results and pathology reports.  If you have any questions about what you read please let us know at your next visit so we can discuss your concerns and take corrective action if need be.  We are right here with you.

## 2021-07-06 ENCOUNTER — Other Ambulatory Visit (HOSPITAL_BASED_OUTPATIENT_CLINIC_OR_DEPARTMENT_OTHER): Payer: Self-pay

## 2021-07-11 ENCOUNTER — Ambulatory Visit (INDEPENDENT_AMBULATORY_CARE_PROVIDER_SITE_OTHER): Payer: 59

## 2021-07-11 DIAGNOSIS — I480 Paroxysmal atrial fibrillation: Secondary | ICD-10-CM

## 2021-07-12 LAB — CUP PACEART REMOTE DEVICE CHECK
Date Time Interrogation Session: 20230705230928
Implantable Pulse Generator Implant Date: 20220727

## 2021-07-13 ENCOUNTER — Other Ambulatory Visit (HOSPITAL_BASED_OUTPATIENT_CLINIC_OR_DEPARTMENT_OTHER): Payer: Self-pay

## 2021-07-13 DIAGNOSIS — M13831 Other specified arthritis, right wrist: Secondary | ICD-10-CM | POA: Diagnosis not present

## 2021-07-13 DIAGNOSIS — M25531 Pain in right wrist: Secondary | ICD-10-CM | POA: Diagnosis not present

## 2021-07-13 DIAGNOSIS — M25532 Pain in left wrist: Secondary | ICD-10-CM | POA: Diagnosis not present

## 2021-07-13 MED ORDER — MELOXICAM 15 MG PO TABS
ORAL_TABLET | ORAL | 0 refills | Status: DC
  Filled 2021-07-13: qty 30, 30d supply, fill #0

## 2021-07-18 ENCOUNTER — Other Ambulatory Visit: Payer: Self-pay | Admitting: Sports Medicine

## 2021-07-18 DIAGNOSIS — F411 Generalized anxiety disorder: Secondary | ICD-10-CM

## 2021-07-19 ENCOUNTER — Encounter (INDEPENDENT_AMBULATORY_CARE_PROVIDER_SITE_OTHER): Payer: Self-pay | Admitting: Family Medicine

## 2021-07-19 ENCOUNTER — Ambulatory Visit (INDEPENDENT_AMBULATORY_CARE_PROVIDER_SITE_OTHER): Payer: 59 | Admitting: Family Medicine

## 2021-07-19 ENCOUNTER — Ambulatory Visit: Payer: 59 | Admitting: Sports Medicine

## 2021-07-19 ENCOUNTER — Other Ambulatory Visit (HOSPITAL_BASED_OUTPATIENT_CLINIC_OR_DEPARTMENT_OTHER): Payer: Self-pay

## 2021-07-19 VITALS — BP 140/84 | HR 72 | Temp 98.3°F | Ht 71.0 in | Wt 224.9 lb

## 2021-07-19 DIAGNOSIS — Z6831 Body mass index (BMI) 31.0-31.9, adult: Secondary | ICD-10-CM

## 2021-07-19 DIAGNOSIS — I10 Essential (primary) hypertension: Secondary | ICD-10-CM

## 2021-07-19 DIAGNOSIS — E669 Obesity, unspecified: Secondary | ICD-10-CM

## 2021-07-19 MED ORDER — WEGOVY 1.7 MG/0.75ML ~~LOC~~ SOAJ
1.7000 mg | SUBCUTANEOUS | 0 refills | Status: DC
  Filled 2021-07-19 – 2021-07-29 (×2): qty 3, 28d supply, fill #0

## 2021-07-21 ENCOUNTER — Encounter: Payer: Self-pay | Admitting: Sports Medicine

## 2021-07-21 ENCOUNTER — Other Ambulatory Visit (HOSPITAL_BASED_OUTPATIENT_CLINIC_OR_DEPARTMENT_OTHER): Payer: Self-pay

## 2021-07-21 DIAGNOSIS — F5101 Primary insomnia: Secondary | ICD-10-CM

## 2021-07-21 MED ORDER — CYCLOBENZAPRINE HCL 10 MG PO TABS
10.0000 mg | ORAL_TABLET | Freq: Every day | ORAL | 3 refills | Status: DC
  Filled 2021-07-21 – 2021-08-08 (×2): qty 90, 90d supply, fill #0
  Filled 2021-10-24: qty 90, 90d supply, fill #1
  Filled 2022-01-22: qty 90, 90d supply, fill #2
  Filled 2022-04-09 – 2022-04-17 (×3): qty 90, 90d supply, fill #3

## 2021-07-21 MED ORDER — DULOXETINE HCL 60 MG PO CPEP
ORAL_CAPSULE | ORAL | 0 refills | Status: DC
  Filled 2021-07-21: qty 90, 90d supply, fill #0

## 2021-07-22 ENCOUNTER — Other Ambulatory Visit (HOSPITAL_BASED_OUTPATIENT_CLINIC_OR_DEPARTMENT_OTHER): Payer: Self-pay

## 2021-07-24 NOTE — Progress Notes (Signed)
Chief Complaint:   OBESITY Travis Huff is here to discuss his progress with his obesity treatment plan along with follow-up of his obesity related diagnoses. [name] is on the Category 4 Plan and states he is following his eating plan approximately 80% of the time. [name] states he is walking 30 minutes 5 times per week.  Today's visit was #: 25 Starting weight: 350 lbs Starting date: 02/03/2020 Today's weight: 221 lbs Today's date: 07/19/2021 Total lbs lost to date: 129 lbs Total lbs lost since last in-office visit: 1 lb  Interim History: Travis Huff was shaking on the scale today and unable to get his weight on regular scale with read out.  He has been doing more exercise in the pool but drinking more light beer than usual.  He feels like he has lost weight and getting in much better shape.    Subjective:   1. Essential hypertension Review: taking medications as instructed, no medication side effects noted, no chest pain on exertion, no dyspnea on exertion, no swelling of ankles. Travis Huff is tolerating medication(s) well without side effects.  Medication compliance is good as patient endorses taking it as prescribed.  The patient denies additional concerns regarding this condition.      Assessment/Plan:  No orders of the defined types were placed in this encounter.   Medications Discontinued During This Encounter  Medication Reason   Semaglutide-Weight Management (WEGOVY) 1.7 MG/0.75ML SOAJ Reorder     Meds ordered this encounter  Medications   Semaglutide-Weight Management (WEGOVY) 1.7 MG/0.75ML SOAJ    Sig: Inject 1.7 mg into the skin once a week.    Dispense:  3 mL    Refill:  0     1. Essential hypertension Landry is working on healthy weight loss and exercise to improve blood pressure control. We will watch for signs of hypotension as he continues his lifestyle modifications.  Blood pressure is in the high normal range.  Continue medications as written.  Continue to  decrease salt intake.  Continue prudent nutritional plan and weight loss.     2. Obesity with current BMI of 31 Travis Huff is doing well with Wegovy at 1.7 weekly dose.  Needs refill today.  He has good control of hunger.  He has some cravings at night, but has snacks that he uses and does well with those.  Refill - Semaglutide-Weight Management (WEGOVY) 1.7 MG/0.75ML SOAJ; Inject 1.7 mg into the skin once a week.  Dispense: 3 mL; Refill: 0  Travis Huff is currently in the action stage of change. As such, his goal is to continue with weight loss efforts. He has agreed to the Category 4 Plan.   Exercise goals:  As is, you are doing a great job!  Behavioral modification strategies: decreasing liquid calories.  Travis Huff has agreed to follow-up with our clinic in 3 weeks. He was informed of the importance of frequent follow-up visits to maximize his success with intensive lifestyle modifications for his multiple health conditions.   Objective:   Blood pressure 140/84, pulse 72, temperature 98.3 F (36.8 C), height '5\' 11"'$  (1.803 m), weight 224 lb 13.9 oz (102 kg), SpO2 98 %. Body mass index is 31.36 kg/m.  General: Cooperative, alert, well developed, in no acute distress. HEENT: Conjunctivae and lids unremarkable. Cardiovascular: Regular rhythm.  Lungs: Normal work of breathing. Neurologic: No focal deficits.   Lab Results  Component Value Date   CREATININE 0.81 01/10/2021   BUN 20 01/10/2021   NA 141 01/10/2021  K 4.2 01/10/2021   CL 100 01/10/2021   CO2 24 01/10/2021   Lab Results  Component Value Date   ALT 23 01/10/2021   AST 33 01/10/2021   ALKPHOS 91 01/10/2021   BILITOT 0.6 01/10/2021   Lab Results  Component Value Date   HGBA1C 5.4 01/10/2021   HGBA1C 5.5 09/16/2020   HGBA1C 5.8 (H) 05/28/2020   HGBA1C 6.0 (H) 02/03/2020   HGBA1C 5.6 06/19/2018   Lab Results  Component Value Date   INSULIN 10.3 02/21/2021   INSULIN 17.2 02/03/2020   Lab Results  Component Value Date    TSH 2.440 02/21/2021   Lab Results  Component Value Date   CHOL 128 02/21/2021   HDL 62 02/21/2021   LDLCALC 52 02/21/2021   TRIG 68 02/21/2021   CHOLHDL 2.1 02/21/2021   Lab Results  Component Value Date   VD25OH 46.4 01/10/2021   VD25OH 60.9 08/30/2020   VD25OH 26.1 (L) 02/03/2020   Lab Results  Component Value Date   WBC 5.8 02/21/2021   HGB 16.4 02/21/2021   HCT 46.4 02/21/2021   MCV 94 02/21/2021   PLT 192 02/21/2021   No results found for: "IRON", "TIBC", "FERRITIN"  Attestation Statements:   Reviewed by clinician on day of visit: allergies, medications, problem list, medical history, surgical history, family history, social history, and previous encounter notes.  I, Davy Pique, RMA, am acting as Location manager for [employer], DO.   I have reviewed the above documentation for accuracy and completeness, and I agree with the above. Marjory Sneddon, D.O.  [place] was signed into law in 2016 which includes the topic of electronic health records.  This provides immediate access to information in MyChart.  This includes consultation notes, operative notes, office notes, lab results and pathology reports.  If you have any questions about what you read please let us know at your next visit so we can discuss your concerns and take corrective action if need be.  We are right here with you.

## 2021-07-25 ENCOUNTER — Other Ambulatory Visit (HOSPITAL_COMMUNITY): Payer: Self-pay | Admitting: Sports Medicine

## 2021-07-25 ENCOUNTER — Other Ambulatory Visit: Payer: Self-pay | Admitting: Sports Medicine

## 2021-07-25 DIAGNOSIS — F5101 Primary insomnia: Secondary | ICD-10-CM

## 2021-07-26 ENCOUNTER — Other Ambulatory Visit (HOSPITAL_BASED_OUTPATIENT_CLINIC_OR_DEPARTMENT_OTHER): Payer: Self-pay

## 2021-07-26 MED ORDER — SILDENAFIL CITRATE 100 MG PO TABS
100.0000 mg | ORAL_TABLET | Freq: Every day | ORAL | 11 refills | Status: DC | PRN
  Filled 2021-07-26: qty 18, 90d supply, fill #0
  Filled 2022-02-19: qty 18, 90d supply, fill #1
  Filled 2022-07-02: qty 18, 90d supply, fill #2

## 2021-07-26 MED ORDER — ZOLPIDEM TARTRATE 10 MG PO TABS
ORAL_TABLET | Freq: Every evening | ORAL | 1 refills | Status: DC | PRN
  Filled 2021-07-26: qty 90, fill #0
  Filled 2021-07-28: qty 90, 90d supply, fill #0
  Filled 2021-10-30: qty 90, 90d supply, fill #1

## 2021-07-28 ENCOUNTER — Other Ambulatory Visit (HOSPITAL_BASED_OUTPATIENT_CLINIC_OR_DEPARTMENT_OTHER): Payer: Self-pay

## 2021-07-29 ENCOUNTER — Other Ambulatory Visit (HOSPITAL_BASED_OUTPATIENT_CLINIC_OR_DEPARTMENT_OTHER): Payer: Self-pay

## 2021-07-29 ENCOUNTER — Encounter (HOSPITAL_BASED_OUTPATIENT_CLINIC_OR_DEPARTMENT_OTHER): Payer: Self-pay

## 2021-08-04 NOTE — Progress Notes (Signed)
[facility] Summary Report / Loop Recorder 

## 2021-08-08 ENCOUNTER — Other Ambulatory Visit (HOSPITAL_BASED_OUTPATIENT_CLINIC_OR_DEPARTMENT_OTHER): Payer: Self-pay

## 2021-08-10 ENCOUNTER — Other Ambulatory Visit (HOSPITAL_BASED_OUTPATIENT_CLINIC_OR_DEPARTMENT_OTHER): Payer: Self-pay

## 2021-08-10 ENCOUNTER — Ambulatory Visit (INDEPENDENT_AMBULATORY_CARE_PROVIDER_SITE_OTHER): Payer: 59 | Admitting: Family Medicine

## 2021-08-10 ENCOUNTER — Encounter (INDEPENDENT_AMBULATORY_CARE_PROVIDER_SITE_OTHER): Payer: Self-pay

## 2021-08-10 DIAGNOSIS — M25531 Pain in right wrist: Secondary | ICD-10-CM | POA: Diagnosis not present

## 2021-08-10 DIAGNOSIS — M189 Osteoarthritis of first carpometacarpal joint, unspecified: Secondary | ICD-10-CM | POA: Diagnosis not present

## 2021-08-10 MED ORDER — MELOXICAM 15 MG PO TABS
ORAL_TABLET | ORAL | 0 refills | Status: DC
  Filled 2021-08-10: qty 30, 30d supply, fill #0

## 2021-08-11 LAB — CUP PACEART REMOTE DEVICE CHECK
Date Time Interrogation Session: 20230807230427
Implantable Pulse Generator Implant Date: 20220727

## 2021-08-15 ENCOUNTER — Ambulatory Visit: Payer: 59

## 2021-08-17 ENCOUNTER — Other Ambulatory Visit (HOSPITAL_BASED_OUTPATIENT_CLINIC_OR_DEPARTMENT_OTHER): Payer: Self-pay

## 2021-08-18 ENCOUNTER — Other Ambulatory Visit (HOSPITAL_BASED_OUTPATIENT_CLINIC_OR_DEPARTMENT_OTHER): Payer: Self-pay

## 2021-08-22 ENCOUNTER — Encounter (INDEPENDENT_AMBULATORY_CARE_PROVIDER_SITE_OTHER): Payer: Self-pay | Admitting: Family Medicine

## 2021-08-22 ENCOUNTER — Other Ambulatory Visit (HOSPITAL_BASED_OUTPATIENT_CLINIC_OR_DEPARTMENT_OTHER): Payer: Self-pay

## 2021-08-22 ENCOUNTER — Ambulatory Visit (INDEPENDENT_AMBULATORY_CARE_PROVIDER_SITE_OTHER): Payer: 59 | Admitting: Family Medicine

## 2021-08-22 VITALS — BP 128/80 | HR 76 | Temp 98.5°F | Ht 71.0 in | Wt 216.0 lb

## 2021-08-22 DIAGNOSIS — Z7985 Long-term (current) use of injectable non-insulin antidiabetic drugs: Secondary | ICD-10-CM

## 2021-08-22 DIAGNOSIS — I10 Essential (primary) hypertension: Secondary | ICD-10-CM | POA: Diagnosis not present

## 2021-08-22 DIAGNOSIS — E669 Obesity, unspecified: Secondary | ICD-10-CM | POA: Diagnosis not present

## 2021-08-22 DIAGNOSIS — E782 Mixed hyperlipidemia: Secondary | ICD-10-CM | POA: Diagnosis not present

## 2021-08-22 DIAGNOSIS — E538 Deficiency of other specified B group vitamins: Secondary | ICD-10-CM

## 2021-08-22 DIAGNOSIS — R7303 Prediabetes: Secondary | ICD-10-CM | POA: Diagnosis not present

## 2021-08-22 DIAGNOSIS — E559 Vitamin D deficiency, unspecified: Secondary | ICD-10-CM

## 2021-08-22 DIAGNOSIS — Z6841 Body Mass Index (BMI) 40.0 and over, adult: Secondary | ICD-10-CM

## 2021-08-22 DIAGNOSIS — Z9189 Other specified personal risk factors, not elsewhere classified: Secondary | ICD-10-CM

## 2021-08-22 MED ORDER — WEGOVY 1.7 MG/0.75ML ~~LOC~~ SOAJ
1.7000 mg | SUBCUTANEOUS | 0 refills | Status: DC
  Filled 2021-08-22: qty 3, 28d supply, fill #0

## 2021-08-23 ENCOUNTER — Other Ambulatory Visit (HOSPITAL_BASED_OUTPATIENT_CLINIC_OR_DEPARTMENT_OTHER): Payer: Self-pay

## 2021-08-28 NOTE — Progress Notes (Unsigned)
Chief Complaint:   OBESITY Travis Huff is here to discuss his progress with his obesity treatment plan along with follow-up of his obesity related diagnoses. Travis Huff is on the Category 4 Plan and states he is following his eating plan approximately 85% of the time. Travis Huff states he is walking 25-30 minutes 5+ times per week.  Today's visit was #: 26 Starting weight: 350 lbs Starting date: 02/03/2020 Today's weight: 216 lbs Today's date: 08/22/2021 Total lbs lost to date: 134 Total lbs lost since last in-office visit: 8  Interim History: Recently, Travis Huff has not been calculating snack calories, however, he has increased his exercise. Pt is walking 5-7 days a week for at least 25-30 minutes. He did great and lost 8 lbs since his last check and maintained muscle mass.  Subjective:   1. Essential hypertension Pt with history of A-fib and ischemic heart disease. Status post acute myocardial infarction in the past. Medication management per cardiology. Medication: Toprol XL, Zestoretic, Cardizem  2. Pre-diabetes Travis Huff denies cravings, except for when eating more carbs.  3. Vitamin D deficiency His Vit D level was 26.1 at the start of our program, however, with his 100+ lbs of weight loss, there is no need for supplementation. Last level checked was more than 8 months ago at 46.4.  4. Vitamin B 12 deficiency Travis Huff is tolerating medication(s) well without side effects.  Medication compliance is good as patient endorses taking it as prescribed.  The patient denies additional concerns regarding this condition. Medication: OTC B12 500 mcg daily  5. Mixed hyperlipidemia Labs were last checked over 6 months ago. Pt has history of hypercoagable state and status post DVT and is taking Eliquis.  6. At risk for activity intolerance Travis Huff is at risk for exercise intolerance due to instability issues.   Assessment/Plan:  No orders of the defined types were placed in this  encounter.   Medications Discontinued During This Encounter  Medication Reason   DULoxetine (CYMBALTA) 60 MG capsule    Semaglutide-Weight Management (WEGOVY) 1.7 MG/0.75ML SOAJ Reorder     Meds ordered this encounter  Medications   Semaglutide-Weight Management (WEGOVY) 1.7 MG/0.75ML SOAJ    Sig: Inject 1.7 mg into the skin once a week.    Dispense:  3 mL    Refill:  0     1. Essential hypertension BP at goal. Pt is asymptomatic and tolerating meds well. Travis Huff is working on healthy weight loss and exercise to improve blood pressure control. We will watch for signs of hypotension as he continues his lifestyle modifications. Continue prudent nutritional plan and weight loss. Check labs at next OV.  2. Pre-diabetes Travis Huff will continue to work on weight loss, exercise, and decreasing simple carbohydrates to help decrease the risk of diabetes. Continue prudent nutritional plan and Wegovy. Check labs at next OV.  3. Vitamin D deficiency Low Vitamin D level contributes to fatigue and are associated with obesity, breast, and colon cancer. He agrees to continue prudent nutritional plan and walking daily for sun exposure. Pt will follow-up for routine testing of Vitamin D, at least 2-3 times per year to avoid over-replacement. Check labs at next OV.  4. Vitamin B 12 deficiency Goal is 500+ and pt's level was not at goal at last check approximately 6 months ago. Continue prudent nutritional plan.  The diagnosis was reviewed with the patient. Counseling provided today, see below. We will continue to monitor. Orders and follow up as documented in patient record. Check  labs at next OV.  Counseling The body needs vitamin B12: to make red blood cells; to make DNA; and to help the nerves work properly so they can carry messages from the brain to the body.  The main causes of vitamin B12 deficiency include dietary deficiency, digestive diseases, pernicious anemia, and having a surgery in which part of  the stomach or small intestine is removed.  Certain medicines can make it harder for the body to absorb vitamin B12. These medicines include: heartburn medications; some antibiotics; some medications used to treat diabetes, gout, and high cholesterol.  In some cases, there are no symptoms of this condition. If the condition leads to anemia or nerve damage, various symptoms can occur, such as weakness or fatigue, shortness of breath, and numbness or tingling in your hands and feet.   Treatment:  May include taking vitamin B12 supplements.  Avoid alcohol.  Eat lots of healthy foods that contain vitamin B12: Beef, pork, chicken, [place], and organ meats, such as liver.  Seafood: This includes clams, rainbow trout, salmon, tuna, and haddock. Eggs.  Cereal and dairy products that are fortified: This means that vitamin B12 has been added to the food.   5. Mixed hyperlipidemia Cardiovascular risk and specific lipid/LDL goals reviewed.  We discussed several lifestyle modifications today and Travis Huff will continue to work on diet, exercise and weight loss efforts. Orders and follow up as documented in patient record. Continue low saturated and trans fat meal plan and exercise. Continue statin therapy and other meds per cardiology/PCP.  Counseling Intensive lifestyle modifications are the first line treatment for this issue. Dietary changes: Increase soluble fiber. Decrease simple carbohydrates. Exercise changes: Moderate to vigorous-intensity aerobic activity 150 minutes per week if tolerated. Lipid-lowering medications: see documented in medical record.  6. At risk for activity intolerance Travis Huff was given approximately 10 minutes of counseling today regarding his increased risk for exercise intolerance.  We discussed patient's specific personal and medical issues that raise our concern.  He was advised of strategies to prevent injury and ways to improve his cardiopulmonary fitness levels slowly over time.   We additionally discussed various fitness trackers and smart phone apps to help motivate the patient to stay on track.   7. Obesity, current BMI 30.2 Travis Huff is currently in the action stage of change. As such, his goal is to continue with weight loss efforts. He has agreed to the Category 4 Plan.   -We discussed various medication options to help Travis Huff with his weight loss efforts and we both agreed to continue [name] as per below. Pt declines need for increase in dose.  Refill- Semaglutide-Weight Management (WEGOVY) 1.7 MG/0.75ML SOAJ; Inject 1.7 mg into the skin once a week.  Dispense: 3 mL; Refill: 0 -Ask Dr. Darene Lamer (PCP) if any exercises/core strengthening or physical therapy would possibly help with balance and stability issues due to back surgery.  Exercise goals:  As is  Behavioral modification strategies: ways to avoid boredom eating, ways to avoid night time snacking, and better snacking choices.  Travis Huff has agreed to follow-up with our clinic in 4 weeks for fasting blood work. He was informed of the importance of frequent follow-up visits to maximize his success with intensive lifestyle modifications for his multiple health conditions.   Objective:   Blood pressure 128/80, pulse 76, temperature 98.5 F (36.9 C), height '5\' 11"'$  (1.803 m), weight 216 lb (98 kg), SpO2 98 %. Body mass index is 30.13 kg/m.  General: Cooperative, alert, well developed, in no  acute distress. HEENT: Conjunctivae and lids unremarkable. Cardiovascular: Regular rhythm.  Lungs: Normal work of breathing. Neurologic: No focal deficits.   Lab Results  Component Value Date   CREATININE 0.81 01/10/2021   BUN 20 01/10/2021   NA 141 01/10/2021   K 4.2 01/10/2021   CL 100 01/10/2021   CO2 24 01/10/2021   Lab Results  Component Value Date   ALT 23 01/10/2021   AST 33 01/10/2021   ALKPHOS 91 01/10/2021   BILITOT 0.6 01/10/2021   Lab Results  Component Value Date   HGBA1C 5.4 01/10/2021   HGBA1C 5.5  09/16/2020   HGBA1C 5.8 (H) 05/28/2020   HGBA1C 6.0 (H) 02/03/2020   HGBA1C 5.6 06/19/2018   Lab Results  Component Value Date   INSULIN 10.3 02/21/2021   INSULIN 17.2 02/03/2020   Lab Results  Component Value Date   TSH 2.440 02/21/2021   Lab Results  Component Value Date   CHOL 128 02/21/2021   HDL 62 02/21/2021   LDLCALC 52 02/21/2021   TRIG 68 02/21/2021   CHOLHDL 2.1 02/21/2021   Lab Results  Component Value Date   VD25OH 46.4 01/10/2021   VD25OH 60.9 08/30/2020   VD25OH 26.1 (L) 02/03/2020   Lab Results  Component Value Date   WBC 5.8 02/21/2021   HGB 16.4 02/21/2021   HCT 46.4 02/21/2021   MCV 94 02/21/2021   PLT 192 02/21/2021   Attestation Statements:   Reviewed by clinician on day of visit: allergies, medications, problem list, medical history, surgical history, family history, social history, and previous encounter notes.  I, Kathlene November, BS, CMA, am acting as transcriptionist for [employer], DO.  I have reviewed the above documentation for accuracy and completeness, and I agree with the above. Marjory Sneddon, D.O.  The [place] was signed into law in 2016 which includes the topic of electronic health records.  This provides immediate access to information in MyChart.  This includes consultation notes, operative notes, office notes, lab results and pathology reports.  If you have any questions about what you read please let us know at your next visit so we can discuss your concerns and take corrective action if need be.  We are right here with you.

## 2021-08-29 ENCOUNTER — Other Ambulatory Visit (HOSPITAL_BASED_OUTPATIENT_CLINIC_OR_DEPARTMENT_OTHER): Payer: Self-pay

## 2021-09-01 DIAGNOSIS — M25532 Pain in left wrist: Secondary | ICD-10-CM | POA: Diagnosis not present

## 2021-09-01 DIAGNOSIS — M13832 Other specified arthritis, left wrist: Secondary | ICD-10-CM | POA: Diagnosis not present

## 2021-09-01 DIAGNOSIS — M13831 Other specified arthritis, right wrist: Secondary | ICD-10-CM | POA: Diagnosis not present

## 2021-09-15 ENCOUNTER — Other Ambulatory Visit (HOSPITAL_BASED_OUTPATIENT_CLINIC_OR_DEPARTMENT_OTHER): Payer: Self-pay

## 2021-09-19 ENCOUNTER — Ambulatory Visit (INDEPENDENT_AMBULATORY_CARE_PROVIDER_SITE_OTHER): Payer: 59

## 2021-09-19 DIAGNOSIS — I480 Paroxysmal atrial fibrillation: Secondary | ICD-10-CM | POA: Diagnosis not present

## 2021-09-20 LAB — CUP PACEART REMOTE DEVICE CHECK
Date Time Interrogation Session: 20230917231122
Implantable Pulse Generator Implant Date: 20220727

## 2021-09-28 ENCOUNTER — Other Ambulatory Visit (HOSPITAL_BASED_OUTPATIENT_CLINIC_OR_DEPARTMENT_OTHER): Payer: Self-pay

## 2021-09-28 ENCOUNTER — Encounter (INDEPENDENT_AMBULATORY_CARE_PROVIDER_SITE_OTHER): Payer: Self-pay | Admitting: Family Medicine

## 2021-09-28 ENCOUNTER — Ambulatory Visit (INDEPENDENT_AMBULATORY_CARE_PROVIDER_SITE_OTHER): Payer: 59 | Admitting: Family Medicine

## 2021-09-28 VITALS — BP 134/88 | HR 62 | Temp 97.7°F | Ht 71.0 in | Wt 214.0 lb

## 2021-09-28 DIAGNOSIS — I10 Essential (primary) hypertension: Secondary | ICD-10-CM | POA: Diagnosis not present

## 2021-09-28 DIAGNOSIS — E782 Mixed hyperlipidemia: Secondary | ICD-10-CM | POA: Diagnosis not present

## 2021-09-28 DIAGNOSIS — Z9189 Other specified personal risk factors, not elsewhere classified: Secondary | ICD-10-CM

## 2021-09-28 DIAGNOSIS — Z6841 Body Mass Index (BMI) 40.0 and over, adult: Secondary | ICD-10-CM

## 2021-09-28 DIAGNOSIS — E669 Obesity, unspecified: Secondary | ICD-10-CM | POA: Diagnosis not present

## 2021-09-28 DIAGNOSIS — E559 Vitamin D deficiency, unspecified: Secondary | ICD-10-CM

## 2021-09-28 DIAGNOSIS — R7303 Prediabetes: Secondary | ICD-10-CM | POA: Diagnosis not present

## 2021-09-28 DIAGNOSIS — Z6829 Body mass index (BMI) 29.0-29.9, adult: Secondary | ICD-10-CM

## 2021-09-28 DIAGNOSIS — E538 Deficiency of other specified B group vitamins: Secondary | ICD-10-CM

## 2021-09-28 MED ORDER — WEGOVY 1.7 MG/0.75ML ~~LOC~~ SOAJ
1.7000 mg | SUBCUTANEOUS | 0 refills | Status: DC
  Filled 2021-09-28: qty 3, 28d supply, fill #0

## 2021-09-29 ENCOUNTER — Telehealth (INDEPENDENT_AMBULATORY_CARE_PROVIDER_SITE_OTHER): Payer: Self-pay | Admitting: Family Medicine

## 2021-09-29 ENCOUNTER — Encounter (INDEPENDENT_AMBULATORY_CARE_PROVIDER_SITE_OTHER): Payer: Self-pay

## 2021-09-29 ENCOUNTER — Other Ambulatory Visit (HOSPITAL_BASED_OUTPATIENT_CLINIC_OR_DEPARTMENT_OTHER): Payer: Self-pay

## 2021-09-29 ENCOUNTER — Encounter (INDEPENDENT_AMBULATORY_CARE_PROVIDER_SITE_OTHER): Payer: Self-pay | Admitting: Family Medicine

## 2021-09-29 NOTE — Telephone Encounter (Signed)
Dr. Raliegh Scarlet - Prior authorization approved for [id number]. Effective: 09/29/2021 to 09/29/2022. Patient sent approval message via mychart.

## 2021-09-29 NOTE — Telephone Encounter (Signed)
Please see chart note.  Patient sent mychart message already. Thanks!

## 2021-09-30 LAB — LIPID PANEL
Chol/HDL Ratio: 1.9 ratio (ref 0.0–5.0)
Cholesterol, Total: 148 mg/dL (ref 100–199)
HDL: 80 mg/dL (ref >39)
LDL Chol Calc (NIH): 55 mg/dL (ref 0–99)
Triglycerides: 61 mg/dL (ref 0–149)
VLDL Cholesterol Cal: 13 mg/dL (ref 5–40)

## 2021-09-30 LAB — COMPREHENSIVE METABOLIC PANEL
ALT: 28 IU/L (ref 0–44)
AST: 36 IU/L (ref 0–40)
Albumin/Globulin Ratio: 2.1 (ref 1.2–2.2)
Albumin: 4.5 g/dL (ref 3.8–4.9)
Alkaline Phosphatase: 82 IU/L (ref 44–121)
BUN/Creatinine Ratio: 22 (ref 10–24)
BUN: 20 mg/dL (ref 8–27)
Bilirubin Total: 0.5 mg/dL (ref 0.0–1.2)
CO2: 22 mmol/L (ref 20–29)
Calcium: 9.6 mg/dL (ref 8.6–10.2)
Chloride: 100 mmol/L (ref 96–106)
Creatinine, Ser: 0.9 mg/dL (ref 0.76–1.27)
Globulin, Total: 2.1 g/dL (ref 1.5–4.5)
Glucose: 79 mg/dL (ref 70–99)
Potassium: 4.4 mmol/L (ref 3.5–5.2)
Sodium: 139 mmol/L (ref 134–144)
Total Protein: 6.6 g/dL (ref 6.0–8.5)
eGFR: 98 mL/min/{1.73_m2} (ref >59)

## 2021-09-30 LAB — HEMOGLOBIN A1C
Est. average glucose Bld gHb Est-mCnc: 100 mg/dL
Hgb A1c MFr Bld: 5.1 % (ref 4.8–5.6)

## 2021-09-30 LAB — VITAMIN D 25 HYDROXY (VIT D DEFICIENCY, FRACTURES): Vit D, 25-Hydroxy: 50.4 ng/mL (ref 30.0–100.0)

## 2021-09-30 LAB — INSULIN, RANDOM: INSULIN: 5.8 u[IU]/mL (ref 2.6–24.9)

## 2021-09-30 LAB — VITAMIN B12: Vitamin B-12: 591 pg/mL (ref 232–1245)

## 2021-10-04 NOTE — Progress Notes (Signed)
[facility] Summary Report / Loop Recorder 

## 2021-10-04 NOTE — Progress Notes (Signed)
Chief Complaint:   OBESITY Travis Huff is here to discuss his progress with his obesity treatment plan along with follow-up of his obesity related diagnoses. Travis Huff is on the Category 4 Plan and states he is following his eating plan approximately 85% of the time. Travis Huff states he is walking 25 minutes 5 times per week.  Today's visit was #: 59 Starting weight: 350 lbs Starting date: 02/03/2020 Today's weight: 214 lbs Today's date: 09/28/2021 Total lbs lost to date: 136 Total lbs lost since last in-office visit: 2  Interim History: Travis Huff recently returned from vacation at the beach and ate and drank, but pt continued to walk. He denies cravings, except for late at night around 11PM-midnight.  Subjective:   1. Essential hypertension Cardiovascular ROS: no chest pain or dyspnea on exertion. Medication: Zestoretic, Toprol XL, Cardizem  2. Mixed hyperlipidemia Travis Huff has hyperlipidemia and has been trying to improve his cholesterol levels with intensive lifestyle modifications including a low saturated fat diet, exercise, and weight loss. He denies any chest pain, claudication, or myalgias. Medication: Lipitor  3. Vitamin B 12 deficiency Pt takes 2500 mcg B12 weekly.  4. Vitamin D deficiency He is currently taking OTC vitamin D 1000 IU each day. He denies nausea, vomiting or muscle weakness.   5. Prediabetes Travis Huff has a diagnosis of prediabetes based on his elevated HgA1c and was informed this puts him at greater risk of developing diabetes. He continues to work on diet and exercise to decrease his risk of diabetes. He denies nausea or hypoglycemia. Medication: Wegovy  6. At risk for heart disease Travis Huff is at risk for heart disease due to elevated visceral fat rating at 16.  Assessment/Plan:   Orders Placed This Encounter  Procedures   Lipid panel   VITAMIN D 25 Hydroxy (Vit-D Deficiency, Fractures)   Vitamin B12   Insulin, random   Hemoglobin A1c   Comprehensive metabolic panel     Medications Discontinued During This Encounter  Medication Reason   Semaglutide-Weight Management (WEGOVY) 1.7 MG/0.75ML SOAJ Reorder     Meds ordered this encounter  Medications   Semaglutide-Weight Management (WEGOVY) 1.7 MG/0.75ML SOAJ    Sig: Inject 1.7 mg into the skin once a week.    Dispense:  3 mL    Refill:  0     1. Essential hypertension Travis Huff is working on healthy weight loss and exercise to improve blood pressure control. We will watch for signs of hypotension as he continues his lifestyle modifications.  Lab/Orders today: - Comprehensive metabolic panel  2. Mixed hyperlipidemia Cardiovascular risk and specific lipid/LDL goals reviewed.  We discussed several lifestyle modifications today and Travis Huff will continue to work on diet, exercise and weight loss efforts. Orders and follow up as documented in patient record.   Counseling Intensive lifestyle modifications are the first line treatment for this issue. Dietary changes: Increase soluble fiber. Decrease simple carbohydrates. Exercise changes: Moderate to vigorous-intensity aerobic activity 150 minutes per week if tolerated. Lipid-lowering medications: see documented in medical record.  Lab/Orders today: - Lipid panel  3. Vitamin B 12 deficiency The diagnosis was reviewed with the patient. Counseling provided today, see below. We will continue to monitor. Orders and follow up as documented in patient record.  Counseling The body needs vitamin B12: to make red blood cells; to make DNA; and to help the nerves work properly so they can carry messages from the brain to the body.  The main causes of vitamin B12 deficiency include dietary deficiency, digestive  diseases, pernicious anemia, and having a surgery in which part of the stomach or small intestine is removed.  Certain medicines can make it harder for the body to absorb vitamin B12. These medicines include: heartburn medications; some antibiotics; some  medications used to treat diabetes, gout, and high cholesterol.  In some cases, there are no symptoms of this condition. If the condition leads to anemia or nerve damage, various symptoms can occur, such as weakness or fatigue, shortness of breath, and numbness or tingling in your hands and feet.   Treatment:  May include taking vitamin B12 supplements.  Avoid alcohol.  Eat lots of healthy foods that contain vitamin B12: Beef, pork, chicken, [place], and organ meats, such as liver.  Seafood: This includes clams, rainbow trout, salmon, tuna, and haddock. Eggs.  Cereal and dairy products that are fortified: This means that vitamin B12 has been added to the food.   Lab/Orders today: - Vitamin B12  4. Vitamin D deficiency Low Vitamin D level contributes to fatigue and are associated with obesity, breast, and colon cancer. He agrees to continue to take prescription Vitamin D 1,000 IU daily and will follow-up for routine testing of Vitamin D, at least 2-3 times per year to avoid over-replacement.  Lab/Orders today: - VITAMIN D 25 Hydroxy (Vit-D Deficiency, Fractures)  5. Prediabetes Travis Huff will continue to work on weight loss, exercise, and decreasing simple carbohydrates to help decrease the risk of diabetes.   Lab/Orders today: - Insulin, random - Hemoglobin A1c  6. At risk for heart disease Due to [employer] current state of health and medical condition(s), he is at a higher risk for heart disease.  This puts the patient at much greater risk to subsequently develop cardiopulmonary conditions that can significantly affect patient's quality of life in a negative manner as well.    At least 12 minutes were spent on counseling Travis Huff about these concerns today, and we discussed the importance of reversing risks factors of obesity, especially truncal and visceral fat, hypertension, hyperlipidemia, and pre-diabetes.  The initial goal is to lose at least 5-10% of starting weight to  help reduce these risk factors.  Counseling: Intensive lifestyle modifications were discussed with Travis Huff as the most appropriate first line of treatment.  he will continue to work on diet, exercise, and weight loss efforts.  We will continue to reassess these conditions on a fairly regular basis in an attempt to decrease the patient's overall morbidity and mortality.  Evidence-based interventions for health behavior change were utilized today including the discussion of self monitoring techniques, problem-solving barriers, and SMART goal setting techniques.  Specifically, regarding patient's less desirable eating habits and patterns, we employed the technique of small changes when [employer] has not been able to fully commit to his prudent nutritional plan.  7. Obesity, current BMI 29.9 Travis Huff is currently in the action stage of change. As such, his goal is to continue with weight loss efforts. He has agreed to the Category 4 Plan.   Pt's goal weight is 195 lbs or less.  Refill - Semaglutide-Weight Management (WEGOVY) 1.7 MG/0.75ML SOAJ; Inject 1.7 mg into the skin once a week.  Dispense: 3 mL; Refill: 0  Exercise goals:  As is  Behavioral modification strategies: increasing lean protein intake, decreasing simple carbohydrates, and planning for success.  Travis Huff has agreed to follow-up with our clinic in 4 weeks. He was informed of the importance of frequent follow-up visits to maximize his success with  intensive lifestyle modifications for his multiple health conditions.   Travis Huff was informed we would discuss his lab results at his next visit unless there is a critical issue that needs to be addressed sooner. Travis Huff agreed to keep his next visit at the agreed upon time to discuss these results.  Objective:   Blood pressure 134/88, pulse 62, temperature 97.7 F (36.5 C), height '5\' 11"'$  (1.803 m), weight 214 lb (97.1 kg), SpO2 98 %. Body mass index is 29.85 kg/m.  General:  Cooperative, alert, well developed, in no acute distress. HEENT: Conjunctivae and lids unremarkable. Cardiovascular: Regular rhythm.  Lungs: Normal work of breathing. Neurologic: No focal deficits.   Lab Results  Component Value Date   CREATININE 0.90 09/28/2021   BUN 20 09/28/2021   NA 139 09/28/2021   K 4.4 09/28/2021   CL 100 09/28/2021   CO2 22 09/28/2021   Lab Results  Component Value Date   ALT 28 09/28/2021   AST 36 09/28/2021   ALKPHOS 82 09/28/2021   BILITOT 0.5 09/28/2021   Lab Results  Component Value Date   HGBA1C 5.1 09/28/2021   HGBA1C 5.4 01/10/2021   HGBA1C 5.5 09/16/2020   HGBA1C 5.8 (H) 05/28/2020   HGBA1C 6.0 (H) 02/03/2020   Lab Results  Component Value Date   INSULIN 5.8 09/28/2021   INSULIN 10.3 02/21/2021   INSULIN 17.2 02/03/2020   Lab Results  Component Value Date   TSH 2.440 02/21/2021   Lab Results  Component Value Date   CHOL 148 09/28/2021   HDL 80 09/28/2021   LDLCALC 55 09/28/2021   TRIG 61 09/28/2021   CHOLHDL 1.9 09/28/2021   Lab Results  Component Value Date   VD25OH 50.4 09/28/2021   VD25OH 46.4 01/10/2021   VD25OH 60.9 08/30/2020   Lab Results  Component Value Date   WBC 5.8 02/21/2021   HGB 16.4 02/21/2021   HCT 46.4 02/21/2021   MCV 94 02/21/2021   PLT 192 02/21/2021    Attestation Statements:   Reviewed by clinician on day of visit: allergies, medications, problem list, medical history, surgical history, family history, social history, and previous encounter notes.  I, Travis Huff, BS, CMA, am acting as transcriptionist for [employer], DO.   I have reviewed the above documentation for accuracy and completeness, and I agree with the above. Travis Huff, D.O.  The [place] was signed into law in 2016 which includes the topic of electronic health records.  This provides immediate access to information in MyChart.  This includes consultation notes, operative notes, office notes, lab  results and pathology reports.  If you have any questions about what you read please let us know at your next visit so we can discuss your concerns and take corrective action if need be.  We are right here with you.

## 2021-10-16 ENCOUNTER — Other Ambulatory Visit (HOSPITAL_COMMUNITY): Payer: Self-pay | Admitting: Cardiology

## 2021-10-16 ENCOUNTER — Other Ambulatory Visit (INDEPENDENT_AMBULATORY_CARE_PROVIDER_SITE_OTHER): Payer: Self-pay | Admitting: Family Medicine

## 2021-10-16 DIAGNOSIS — Z6841 Body Mass Index (BMI) 40.0 and over, adult: Secondary | ICD-10-CM

## 2021-10-17 ENCOUNTER — Other Ambulatory Visit (HOSPITAL_BASED_OUTPATIENT_CLINIC_OR_DEPARTMENT_OTHER): Payer: Self-pay

## 2021-10-18 ENCOUNTER — Other Ambulatory Visit (HOSPITAL_BASED_OUTPATIENT_CLINIC_OR_DEPARTMENT_OTHER): Payer: Self-pay

## 2021-10-18 MED ORDER — PEG 3350-KCL-NABCB-NACL-NASULF 236 G PO SOLR
ORAL | 0 refills | Status: DC
  Filled 2021-10-18: qty 8000, 2d supply, fill #0

## 2021-10-18 MED ORDER — MULTAQ 400 MG PO TABS
400.0000 mg | ORAL_TABLET | Freq: Two times a day (BID) | ORAL | 0 refills | Status: DC
  Filled 2021-10-18: qty 30, 15d supply, fill #0

## 2021-10-18 MED ORDER — BISACODYL 5 MG PO TBEC
DELAYED_RELEASE_TABLET | ORAL | 0 refills | Status: DC
  Filled 2021-10-18: qty 25, 25d supply, fill #0

## 2021-10-19 ENCOUNTER — Encounter (INDEPENDENT_AMBULATORY_CARE_PROVIDER_SITE_OTHER): Payer: Self-pay | Admitting: Family Medicine

## 2021-10-24 ENCOUNTER — Other Ambulatory Visit (HOSPITAL_BASED_OUTPATIENT_CLINIC_OR_DEPARTMENT_OTHER): Payer: Self-pay

## 2021-10-24 ENCOUNTER — Ambulatory Visit (INDEPENDENT_AMBULATORY_CARE_PROVIDER_SITE_OTHER): Payer: 59

## 2021-10-24 ENCOUNTER — Other Ambulatory Visit (INDEPENDENT_AMBULATORY_CARE_PROVIDER_SITE_OTHER): Payer: Self-pay

## 2021-10-24 DIAGNOSIS — I480 Paroxysmal atrial fibrillation: Secondary | ICD-10-CM | POA: Diagnosis not present

## 2021-10-24 MED ORDER — WEGOVY 1.7 MG/0.75ML ~~LOC~~ SOAJ
1.7000 mg | SUBCUTANEOUS | 0 refills | Status: DC
  Filled 2021-10-24: qty 3, 28d supply, fill #0

## 2021-10-26 LAB — CUP PACEART REMOTE DEVICE CHECK
Date Time Interrogation Session: 20231022232311
Implantable Pulse Generator Implant Date: 20220727

## 2021-10-31 ENCOUNTER — Other Ambulatory Visit (HOSPITAL_COMMUNITY): Payer: Self-pay | Admitting: Cardiology

## 2021-10-31 ENCOUNTER — Encounter (INDEPENDENT_AMBULATORY_CARE_PROVIDER_SITE_OTHER): Payer: Self-pay | Admitting: Family Medicine

## 2021-10-31 ENCOUNTER — Ambulatory Visit (INDEPENDENT_AMBULATORY_CARE_PROVIDER_SITE_OTHER): Payer: 59 | Admitting: Family Medicine

## 2021-10-31 ENCOUNTER — Other Ambulatory Visit (HOSPITAL_BASED_OUTPATIENT_CLINIC_OR_DEPARTMENT_OTHER): Payer: Self-pay

## 2021-10-31 ENCOUNTER — Other Ambulatory Visit: Payer: Self-pay | Admitting: Interventional Cardiology

## 2021-10-31 VITALS — BP 156/109 | HR 78 | Temp 98.4°F | Ht 71.0 in | Wt 210.0 lb

## 2021-10-31 DIAGNOSIS — I10 Essential (primary) hypertension: Secondary | ICD-10-CM | POA: Diagnosis not present

## 2021-10-31 DIAGNOSIS — E559 Vitamin D deficiency, unspecified: Secondary | ICD-10-CM | POA: Diagnosis not present

## 2021-10-31 DIAGNOSIS — E7849 Other hyperlipidemia: Secondary | ICD-10-CM

## 2021-10-31 DIAGNOSIS — Z6829 Body mass index (BMI) 29.0-29.9, adult: Secondary | ICD-10-CM | POA: Diagnosis not present

## 2021-10-31 DIAGNOSIS — E669 Obesity, unspecified: Secondary | ICD-10-CM

## 2021-10-31 DIAGNOSIS — R7303 Prediabetes: Secondary | ICD-10-CM | POA: Diagnosis not present

## 2021-10-31 DIAGNOSIS — E538 Deficiency of other specified B group vitamins: Secondary | ICD-10-CM

## 2021-10-31 DIAGNOSIS — E66813 Obesity, class 3: Secondary | ICD-10-CM

## 2021-10-31 MED ORDER — WEGOVY 1.7 MG/0.75ML ~~LOC~~ SOAJ
1.7000 mg | SUBCUTANEOUS | 0 refills | Status: DC
  Filled 2021-10-31 – 2021-11-19 (×2): qty 3, 28d supply, fill #0

## 2021-11-01 ENCOUNTER — Other Ambulatory Visit (HOSPITAL_BASED_OUTPATIENT_CLINIC_OR_DEPARTMENT_OTHER): Payer: Self-pay

## 2021-11-01 MED ORDER — MULTAQ 400 MG PO TABS
400.0000 mg | ORAL_TABLET | Freq: Two times a day (BID) | ORAL | 0 refills | Status: DC
  Filled 2021-11-01: qty 30, 15d supply, fill #0

## 2021-11-01 MED ORDER — LISINOPRIL-HYDROCHLOROTHIAZIDE 20-25 MG PO TABS: 0.5000 | ORAL_TABLET | Freq: Every day | ORAL | 2 refills | Status: DC

## 2021-11-08 ENCOUNTER — Other Ambulatory Visit: Payer: Self-pay | Admitting: Sports Medicine

## 2021-11-08 ENCOUNTER — Other Ambulatory Visit (HOSPITAL_BASED_OUTPATIENT_CLINIC_OR_DEPARTMENT_OTHER): Payer: Self-pay

## 2021-11-08 DIAGNOSIS — I10 Essential (primary) hypertension: Secondary | ICD-10-CM

## 2021-11-09 ENCOUNTER — Other Ambulatory Visit (HOSPITAL_BASED_OUTPATIENT_CLINIC_OR_DEPARTMENT_OTHER): Payer: Self-pay

## 2021-11-09 ENCOUNTER — Ambulatory Visit: Payer: 59 | Admitting: Sports Medicine

## 2021-11-09 ENCOUNTER — Ambulatory Visit (INDEPENDENT_AMBULATORY_CARE_PROVIDER_SITE_OTHER): Payer: 59

## 2021-11-09 VITALS — BP 157/99 | HR 76

## 2021-11-09 DIAGNOSIS — R059 Cough, unspecified: Secondary | ICD-10-CM | POA: Diagnosis not present

## 2021-11-09 DIAGNOSIS — R062 Wheezing: Secondary | ICD-10-CM | POA: Diagnosis not present

## 2021-11-09 MED ORDER — HYDROCOD POLI-CHLORPHE POLI ER 10-8 MG/5ML PO SUER
5.0000 mL | Freq: Two times a day (BID) | ORAL | 0 refills | Status: DC | PRN
  Filled 2021-11-09: qty 120, 12d supply, fill #0

## 2021-11-09 MED ORDER — TRELEGY ELLIPTA 100-62.5-25 MCG/ACT IN AEPB: 1.0000 | INHALATION_SPRAY | Freq: Every day | RESPIRATORY_TRACT | 11 refills | Status: DC

## 2021-11-09 MED ORDER — PREDNISONE 50 MG PO TABS
50.0000 mg | ORAL_TABLET | Freq: Every day | ORAL | 0 refills | Status: DC
  Filled 2021-11-09: qty 5, 5d supply, fill #0

## 2021-11-09 MED ORDER — AZITHROMYCIN 250 MG PO TABS
ORAL_TABLET | ORAL | 0 refills | Status: DC
  Filled 2021-11-09: qty 6, 5d supply, fill #0

## 2021-11-09 MED ORDER — LISINOPRIL-HYDROCHLOROTHIAZIDE 20-25 MG PO TABS
0.5000 | ORAL_TABLET | Freq: Every day | ORAL | 3 refills | Status: DC
  Filled 2021-11-09: qty 45, 90d supply, fill #0

## 2021-11-09 NOTE — Assessment & Plan Note (Signed)
This is a very pleasant 61 year old male, he has had about 3 weeks of cough, wheeze, mild shortness of breath. No fevers, chills, wife tested negative for COVID. On exam he does have some visible rhinitis, he has inspiratory and expiratory wheezes. We will treat him aggressively, prednisone, azithromycin, Tussionex, I gave him a Trelegy inhaler sample. Chest x-ray. Return to see me if not better in 3 weeks.

## 2021-11-09 NOTE — Progress Notes (Signed)
    Procedures performed today:    None.  Independent interpretation of notes and tests performed by another provider:   None.  Brief History, Exam, Impression, and Recommendations:    Wheezing This is a very pleasant 61 year old male, he has had about 3 weeks of cough, wheeze, mild shortness of breath. No fevers, chills, wife tested negative for COVID. On exam he does have some visible rhinitis, he has inspiratory and expiratory wheezes. We will treat him aggressively, prednisone, azithromycin, Tussionex, I gave him a Trelegy inhaler sample. Chest x-ray. Return to see me if not better in 3 weeks.    ____________________________________________ Gwen Her. Dianah Field, M.D., ABFM., CAQSM., AME. Primary Care and Sports Medicine [facility]  Adjunct Professor of [employer] Medicine  Risk manager

## 2021-11-10 NOTE — Progress Notes (Signed)
Chief Complaint:   OBESITY Travis Huff is here to discuss his progress with his obesity treatment plan along with follow-up of his obesity related diagnoses. Travis Huff is on the Category 4 Plan and states he is following his eating plan approximately 85% of the time. Travis Huff states he is walking 30 minutes 5 times per week.  Today's visit was #: 28 Starting weight: 350 lbs Starting date: 02/03/2020 Today's weight: 210 lbs Today's date: 10/31/2021 Total lbs lost to date: 140 lbs Total lbs lost since last in-office visit: 4 lbs  Interim History: Travis Huff has been under a lot of stress lately with his wife and his elderly mom. "This has not been my best month on the meal plan". Usually 3 beers a night now 1 or 2 most nights. He is still walking 5 days per week. He is here to review labs drawn at last office visit.  Subjective:   1. Essential hypertension Labs discussed during visit today. Travis Huff did not take his blood pressure medications yet because he has not eaten yet. Blood pressure controlled at home. He has been walking more and drinking 1 gallon of water a day.  2. Pre-diabetes Labs discussed during visit today. Travis Huff is on [employer].  3. Other hyperlipidemia Labs discussed during visit today. Travis Huff is taking Lipitor.   4. Vitamin D deficiency Labs discussed during visit today.  5. B12 deficiency Labs discussed during visit today. Travis Huff's B12 was less then 300, now almost at 600  Assessment/Plan:  No orders of the defined types were placed in this encounter.   Medications Discontinued During This Encounter  Medication Reason   Semaglutide-Weight Management (WEGOVY) 1.7 MG/0.75ML SOAJ Reorder     Meds ordered this encounter  Medications   Semaglutide-Weight Management (WEGOVY) 1.7 MG/0.75ML SOAJ    Sig: Inject 1.7 mg into the skin once a week.    Dispense:  3 mL    Refill:  0     1. Essential hypertension Blood pressure high today. Continue medications per PCP. CMP within  normal limits and serum cret--stable. Take medications every Am. Check blood pressure at home and follow up sooner if not at goal.  2. Pre-diabetes A1c and fasting insulin improved! Great. Continue Prudent Nutritional Plan and weight loss  3. Other hyperlipidemia FLP improved with a decreased LDL and HDL increased. Continue medications, Prudent Nutritional Plan and increase exercise.  4. Vitamin D deficiency Vit D is at goal. No need for ergocalciferol. Continue MV/over the counter supplement.  5. B12 deficiency B12 at goal. Continue supplement and Prudent Nutritional Plan. The diagnosis was reviewed with the patient. Counseling provided today, see below. We will continue to monitor. Orders and follow up as documented in patient record.  Counseling The body needs vitamin B12: to make red blood cells; to make DNA; and to help the nerves work properly so they can carry messages from the brain to the body.  The main causes of vitamin B12 deficiency include dietary deficiency, digestive diseases, pernicious anemia, and having a surgery in which part of the stomach or small intestine is removed.  Certain medicines can make it harder for the body to absorb vitamin B12. These medicines include: heartburn medications; some antibiotics; some medications used to treat diabetes, gout, and high cholesterol.  In some cases, there are no symptoms of this condition. If the condition leads to anemia or nerve damage, various symptoms can occur, such as weakness or fatigue, shortness of breath, and numbness or tingling in your hands  and feet.   Treatment:  May include taking vitamin B12 supplements.  Avoid alcohol.  Eat lots of healthy foods that contain vitamin B12: Beef, pork, chicken, [place], and organ meats, such as liver.  Seafood: This includes clams, rainbow trout, salmon, tuna, and haddock. Eggs.  Cereal and dairy products that are fortified: This means that vitamin B12 has been added to the food.     6. Obesity, current BMI 29.3 We will refill Wegovy 1.7 mg SubQ once weekly for 1 month with 0 refills.  -Refill Semaglutide-Weight Management (WEGOVY) 1.7 MG/0.75ML SOAJ; Inject 1.7 mg into the skin once a week.  Dispense: 3 mL; Refill: 0  Travis Huff is currently in the action stage of change. As such, his goal is to continue with weight loss efforts. He has agreed to the Category 4 Plan.   Exercise goals: As is. Increase as tolerated. Consider strength training.  Behavioral modification strategies: holiday eating strategies  and planning for success.  Travis Huff has agreed to follow-up with our clinic in 6 weeks. He was informed of the importance of frequent follow-up visits to maximize his success with intensive lifestyle modifications for his multiple health conditions.   Objective:   Blood pressure (!) 156/109, pulse 78, temperature 98.4 F (36.9 C), height '5\' 11"'$  (1.803 m), weight 210 lb (95.3 kg), SpO2 98 %. Body mass index is 29.29 kg/m.  General: Cooperative, alert, well developed, in no acute distress. HEENT: Conjunctivae and lids unremarkable. Cardiovascular: Regular rhythm.  Lungs: Normal work of breathing. Neurologic: No focal deficits.   Lab Results  Component Value Date   CREATININE 0.90 09/28/2021   BUN 20 09/28/2021   NA 139 09/28/2021   K 4.4 09/28/2021   CL 100 09/28/2021   CO2 22 09/28/2021   Lab Results  Component Value Date   ALT 28 09/28/2021   AST 36 09/28/2021   ALKPHOS 82 09/28/2021   BILITOT 0.5 09/28/2021   Lab Results  Component Value Date   HGBA1C 5.1 09/28/2021   HGBA1C 5.4 01/10/2021   HGBA1C 5.5 09/16/2020   HGBA1C 5.8 (H) 05/28/2020   HGBA1C 6.0 (H) 02/03/2020   Lab Results  Component Value Date   INSULIN 5.8 09/28/2021   INSULIN 10.3 02/21/2021   INSULIN 17.2 02/03/2020   Lab Results  Component Value Date   TSH 2.440 02/21/2021   Lab Results  Component Value Date   CHOL 148 09/28/2021   HDL 80 09/28/2021   LDLCALC 55  09/28/2021   TRIG 61 09/28/2021   CHOLHDL 1.9 09/28/2021   Lab Results  Component Value Date   VD25OH 50.4 09/28/2021   VD25OH 46.4 01/10/2021   VD25OH 60.9 08/30/2020   Lab Results  Component Value Date   WBC 5.8 02/21/2021   HGB 16.4 02/21/2021   HCT 46.4 02/21/2021   MCV 94 02/21/2021   PLT 192 02/21/2021   No results found for: "IRON", "TIBC", "FERRITIN"  Attestation Statements:   Reviewed by clinician on day of visit: allergies, medications, problem list, medical history, surgical history, family history, social history, and previous encounter notes.  I, Brendell Tyus, am acting as Location manager for [employer], DO.   I have reviewed the above documentation for accuracy and completeness, and I agree with the above. Marjory Sneddon, D.O.  The [place] was signed into law in 2016 which includes the topic of electronic health records.  This provides immediate access to information in MyChart.  This includes consultation notes, operative notes, office notes,  lab results and pathology reports.  If you have any questions about what you read please let us know at your next visit so we can discuss your concerns and take corrective action if need be.  We are right here with you.

## 2021-11-14 ENCOUNTER — Other Ambulatory Visit (HOSPITAL_BASED_OUTPATIENT_CLINIC_OR_DEPARTMENT_OTHER): Payer: Self-pay

## 2021-11-17 ENCOUNTER — Other Ambulatory Visit (HOSPITAL_BASED_OUTPATIENT_CLINIC_OR_DEPARTMENT_OTHER): Payer: Self-pay

## 2021-11-19 ENCOUNTER — Other Ambulatory Visit (HOSPITAL_BASED_OUTPATIENT_CLINIC_OR_DEPARTMENT_OTHER): Payer: Self-pay

## 2021-11-21 ENCOUNTER — Other Ambulatory Visit (HOSPITAL_COMMUNITY): Payer: Self-pay | Admitting: Cardiology

## 2021-11-21 ENCOUNTER — Other Ambulatory Visit (HOSPITAL_BASED_OUTPATIENT_CLINIC_OR_DEPARTMENT_OTHER): Payer: Self-pay

## 2021-11-22 ENCOUNTER — Other Ambulatory Visit (HOSPITAL_BASED_OUTPATIENT_CLINIC_OR_DEPARTMENT_OTHER): Payer: Self-pay

## 2021-11-22 NOTE — Progress Notes (Signed)
[facility] Summary Report / Loop Recorder 

## 2021-11-28 ENCOUNTER — Ambulatory Visit (INDEPENDENT_AMBULATORY_CARE_PROVIDER_SITE_OTHER): Payer: 59

## 2021-11-28 DIAGNOSIS — I480 Paroxysmal atrial fibrillation: Secondary | ICD-10-CM | POA: Diagnosis not present

## 2021-11-29 ENCOUNTER — Ambulatory Visit: Payer: 59 | Admitting: Sports Medicine

## 2021-11-29 LAB — CUP PACEART REMOTE DEVICE CHECK
Date Time Interrogation Session: 20231126231847
Implantable Pulse Generator Implant Date: 20220727

## 2021-12-02 ENCOUNTER — Ambulatory Visit: Payer: 59 | Attending: General Practice | Admitting: General Practice

## 2021-12-02 ENCOUNTER — Telehealth: Payer: Self-pay | Admitting: Interventional Cardiology

## 2021-12-02 ENCOUNTER — Telehealth: Payer: Self-pay | Admitting: *Deleted

## 2021-12-02 DIAGNOSIS — Z01810 Encounter for preprocedural cardiovascular examination: Secondary | ICD-10-CM

## 2021-12-02 NOTE — Telephone Encounter (Signed)
   Pre-operative Risk Assessment    Patient Name: Travis Huff  DOB: 09/01/1960 MRN: [id number]      Request for Surgical Clearance    Procedure:   Colonoscopy   Date of Surgery:  Clearance 12/07/2021                                 Surgeon:  Dr Arta Silence Surgeon's Group or Practice Name:  [facility]  Phone number:  [phone]     Fax number:  [phone]   Type of Clearance Requested:   - Medical    Type of Anesthesia:   Mac/ Monitored    Additional requests/questions:  Please advise surgeon/provider what medications should be held.  Signed, Edwena Blow A Stovall   12/02/2021, 1:55 PM

## 2021-12-02 NOTE — Telephone Encounter (Signed)
Tele add on today ok per Coletta Memos, FNP, pre op provider. Med rec and consent are done.

## 2021-12-02 NOTE — Telephone Encounter (Signed)
Patient with diagnosis of afib and recurrent PE/DVTs on Eliquis for anticoagulation.    Procedure: colonoscopy Date of procedure: 12/07/21  CHA2DS2-VASc Score = 3  This indicates a 3.2% annual risk of stroke. The patient's score is based upon: CHF History: 1 HTN History: 1 Diabetes History: 0 Stroke History: 0 Vascular Disease History: 1 Age Score: 0 Gender Score: 0  Also with history of recurrent VTEs.  CrCl >17m/min Platelet count 192K  Per office protocol, patient can hold Eliquis for 1 day prior to procedure.    **This guidance is not considered finalized until pre-operative APP has relayed final recommendations.**

## 2021-12-02 NOTE — Telephone Encounter (Signed)
   Name: Travis Huff  DOB: [birth year]  MRN: [id number]  Primary Cardiologist: Sinclair Grooms, MD   Preoperative team, please contact this patient and set up a phone call appointment for further preoperative risk assessment. Please obtain consent and complete medication review. Thank you for your help.  I confirm that guidance regarding antiplatelet and oral anticoagulation therapy has been completed and, if necessary, noted below.  Pharmacy has provided recommendations for holding anticoagulation.   Deberah Pelton, NP 12/02/2021, 2:26 PM [place]

## 2021-12-02 NOTE — Progress Notes (Signed)
Virtual Visit via Telephone Note   Because of Travis Huff's co-morbid illnesses, he is at least at moderate risk for complications without adequate follow up.  This format is felt to be most appropriate for this patient at this time.  The patient did not have access to video technology/had technical difficulties with video requiring transitioning to audio format only (telephone).  All issues noted in this document were discussed and addressed.  No physical exam could be performed with this format.  Please refer to the patient's chart for his consent to telehealth for [facility].  Evaluation Performed:  Preoperative cardiovascular risk assessment _____________   Date:  12/02/2021   Patient ID:  Travis Huff, DOB September 18, 1960, MRN [id number] Patient Location:  Home Provider location:   Office  Primary Care Provider:  Silverio Decamp, MD Primary Cardiologist:  Sinclair Grooms, MD  Chief Complaint / Patient Profile   61 y.o. y/o male with a h/o paroxysmal atrial fibrillation, CAD, HTN, HLD who is pending colonoscopy and presents today for telephonic preoperative cardiovascular risk assessment.  History of Present Illness    Travis Huff is a 61 y.o. male who presents via audio/video conferencing for a telehealth visit today.  Pt was last seen in cardiology clinic on 05/02/2021 by Dr.Smith  at that time BENNY DEUTSCHMAN was doing well .  The patient is now pending procedure as outlined above. Since his last visit, he remains stable from a cardiac standpoint.  Today he denies chest pain, shortness of breath, lower extremity edema, fatigue, palpitations, melena, hematuria, hemoptysis, diaphoresis, weakness, presyncope, syncope, orthopnea, and PND.   Past Medical History    Past Medical History:  Diagnosis Date   Arrhythmia    Arthritis    shoulders, knees,  back,hips   Atrial fibrillation (HCC)    Avascular necrosis of bone of left hip (HCC)    Back pain     Balance problem    Blood clot in vein 06/2016   x 2 no bleeding disorders found after back surgery   Complication of anesthesia    ketamine hallucinations   Constipation    Coronary artery disease    Dislocated hip, left, initial encounter ([place]) 10/03/2018   Dysrhythmia 2020   a-fib   Hx of blood clots    Hypertension    Implantable loop recorder present    Joint pain    Neuromuscular disorder (HCC)    neuropathy bi lat legs knee down   Neuropathy    PAF (paroxysmal atrial fibrillation) (HCC)    Pre-diabetes    Prediabetes    Shakes    SOBOE (shortness of breath on exertion)    Swelling of both lower extremities    Viral pericarditis 20 yrs ago   Past Surgical History:  Procedure Laterality Date   ABDOMINAL EXPOSURE N/A 06/23/2016   Procedure: ABDOMINAL EXPOSURE;  Surgeon: Rosetta Posner, MD;  Location: [place];  Service: Vascular;  Laterality: N/A;   ANTERIOR LATERAL LUMBAR FUSION 4 LEVELS Right 06/23/2016   Procedure: Right  Lumbar two-three Lumbar three-four Lumbar four-five Anterolateral lumbar interbody fusion;  Surgeon: Erline Levine, MD;  Location: [place];  Service: Neurosurgery;  Laterality: Right;   ANTERIOR LUMBAR FUSION N/A 06/23/2016   Procedure: Lumbar five -sacral one  Anterior lumbar interbody fusion with Dr. Sherren Mocha Early for approach;  Surgeon: Erline Levine, MD;  Location: [place];  Service: Neurosurgery;  Laterality: N/A;   APPLICATION OF INTRAOPERATIVE CT SCAN N/A  06/26/2016   Procedure: APPLICATION OF INTRAOPERATIVE CT SCAN;  Surgeon: Erline Levine, MD;  Location: [place];  Service: Neurosurgery;  Laterality: N/A;   BACK SURGERY  2018   CARDIAC CATHETERIZATION     CARDIOVASCULAR STRESS TEST     Negative in May 2014   COLONOSCOPY     HERNIA REPAIR     umbilical   HIP CLOSED REDUCTION Left 10/03/2018   Procedure: CLOSED REDUCTION HIP;  Surgeon: Rod Can, MD;  Location: [facility];  Service: Orthopedics;  Laterality: Left;   IR IVC FILTER PLMT / S&I /IMG GUID/MOD SED   03/26/2018   IR RADIOLOGIST EVAL & MGMT  04/16/2019   JOINT REPLACEMENT     LEFT HEART CATH AND CORONARY ANGIOGRAPHY N/A 06/21/2018   Procedure: LEFT HEART CATH AND CORONARY ANGIOGRAPHY;  Surgeon: Jettie Booze, MD;  Location: [facility];  Service: Cardiovascular;  Laterality: N/A;   POSTERIOR LUMBAR FUSION 4 LEVEL N/A 06/26/2016   Procedure: Thoracic ten to Pelvis fixation with AIRO;  Surgeon: Erline Levine, MD;  Location: [place];  Service: Neurosurgery;  Laterality: N/A;   REVERSE SHOULDER ARTHROPLASTY Right 09/18/2019   Procedure: REVERSE SHOULDER ARTHROPLASTY;  Surgeon: Justice Britain, MD;  Location: [facility];  Service: Orthopedics;  Laterality: Right;  110mn   REVERSE SHOULDER ARTHROPLASTY Left 06/10/2020   Procedure: REVERSE SHOULDER ARTHROPLASTY;  Surgeon: SJustice Britain MD;  Location: [facility];  Service: Orthopedics;  Laterality: Left;  1270m   TENDON REPAIR     right hand   TOTAL HIP ARTHROPLASTY Right 05/25/2014   Procedure: RIGHT TOTAL HIP ARTHROPLASTY ANTERIOR APPROACH;  Surgeon: BrRod CanMD;  [facility] Service: Orthopedics;  Laterality: Right;   TOTAL HIP ARTHROPLASTY Left 03/27/2018   Procedure: TOTAL HIP ARTHROPLASTY ANTERIOR APPROACH- DA complex;  Surgeon: SwRod CanMD;  Location: [facility];  Service: Orthopedics;  Laterality: Left;   TOTAL KNEE ARTHROPLASTY Bilateral    TOTAL SHOULDER REVISION Left 10/14/2020   Procedure: Revision Left reverse shoulder arthroplasty;  Surgeon: SuJustice BritainMD;  Location: [facility];  Service: Orthopedics;  Laterality: Left;  12050m   Allergies  Allergies  Allergen Reactions   Ketamine     hallucinations    Home Medications    Prior to Admission medications   Medication Sig Start Date End Date Taking? Authorizing Provider  acetaminophen (TYLENOL) 500 MG tablet Take 1,000 mg by mouth every 6 (six) hours as needed for mild pain or headache.    [provider]  apixaban (ELIQUIS) 5 MG TABS tablet TAKE 1  TABLET BY MOUTH TWICE DAILY 06/22/21 06/22/22  Camnitz, WilOcie DoyneD  atorvastatin (LIPITOR) 80 MG tablet TAKE 1 TABLET BY MOUTH DAILY AT 6PM 05/23/21   SmiBelva CromeD  bisacodyl 5 MG EC tablet Take by mouth as directed. Patient not taking: Reported on 12/02/2021 06/15/21     chlorpheniramine-HYDROcodone (TUSSIONEX) 10-8 MG/5ML Take 5 mLs by mouth every 12 (twelve) hours as needed for cough (cough, will cause drowsiness.). Patient not taking: Reported on 12/02/2021 11/09/21   TheSilverio DecampD  Cyanocobalamin (B-12) 500 MCG TABS 300 mcg- 500 mcg qd B12 03/14/21   Opalski, Deborah, DO  cyclobenzaprine (FLEXERIL) 10 MG tablet Take 1 tablet (10 mg total) by mouth at bedtime. 07/21/21   TheSilverio DecampD  diltiazem (CARTIA XT) 120 MG 24 hr capsule TAKE 1 CAPSULE (120 MG TOTAL) BY MOUTH DAILY. 05/17/21   SmiBelva CromeD  docusate sodium (COLACE)  100 MG capsule Take 1 capsule (100 mg total) by mouth 2 (two) times daily. 03/28/18   Swinteck, Aaron Edelman, MD  dronedarone (MULTAQ) 400 MG tablet Take 1 tablet (400 mg total) by mouth 2 (two) times daily with a meal. Please contact our office to schedule an overdue appointment with Dr. Curt Bears for any future refills, thank you. [phone]. Final attempt. Patient not taking: Reported on 12/02/2021 11/01/21   Constance Haw, MD  Fluticasone-Umeclidin-Vilant (TRELEGY ELLIPTA) 100-62.5-25 MCG/ACT AEPB Inhale 1 puff into the lungs daily. Patient not taking: Reported on 12/02/2021 11/09/21   Silverio Decamp, MD  lisinopril-hydrochlorothiazide (ZESTORETIC) 20-25 MG tablet Take 0.5 tablets by mouth daily. 11/09/21   Silverio Decamp, MD  meloxicam (MOBIC) 15 MG tablet Take 1 tablet by mouth every day Patient not taking: Reported on 12/02/2021 08/10/21     metoprolol succinate (TOPROL-XL) 50 MG 24 hr tablet Take 1 tablet (50 mg total) by mouth daily. Take with or immediately following a meal. 01/13/21 01/18/22  Camnitz, Ocie Doyne, MD   polyethylene glycol (GOLYTELY) 236 g solution Use as directed for 2 day prep. 06/15/21     predniSONE (DELTASONE) 50 MG tablet Take 1 tablet (50 mg total) by mouth daily for 5 days. Patient not taking: Reported on 12/02/2021 11/09/21   Silverio Decamp, MD  Semaglutide-Weight Management (WEGOVY) 1.7 MG/0.75ML SOAJ Inject 1.7 mg into the skin once a week. 10/31/21   Opalski, Neoma Laming, DO  sildenafil (VIAGRA) 100 MG tablet Take 1 tablet (100 mg total) by mouth daily as needed. Avoid use with nitroglycerin 07/26/21   Silverio Decamp, MD  zolpidem (AMBIEN) 10 MG tablet TAKE 1 TABLET BY MOUTH EVERY NIGHT AT BEDTIME AS NEEDED FOR SLEEP 07/26/21 01/29/22  Silverio Decamp, MD    Physical Exam    Vital Signs:  Travis Huff does not have vital signs available for review today.  Given telephonic nature of communication, physical exam is limited. AAOx3. NAD. Normal affect.  Speech and respirations are unlabored.  Accessory Clinical Findings    None  Assessment & Plan    1.  Preoperative Cardiovascular Risk Assessment: Colonoscopy, Dr. Arta Silence, [facility]      Primary Cardiologist: Sinclair Grooms, MD  Chart reviewed as part of pre-operative protocol coverage. Given past medical history and time since last visit, based on ACC/AHA guidelines, [place] would be at acceptable risk for the planned procedure without further cardiovascular testing.   Patient was advised that if he develops new symptoms prior to surgery to contact our office to arrange a follow-up appointment.  He verbalized understanding.  Patient with diagnosis of afib and recurrent PE/DVTs on Eliquis for anticoagulation.     Procedure: colonoscopy Date of procedure: 12/07/21   CHA2DS2-VASc Score = 3  This indicates a 3.2% annual risk of stroke. The patient's score is based upon: CHF History: 1 HTN History: 1 Diabetes History: 0 Stroke History: 0 Vascular Disease History: 1 Age Score:  0 Gender Score: 0   Also with history of recurrent VTEs.   CrCl >159m/min Platelet count 192K   Per office protocol, patient can hold Eliquis for 1 day prior to procedure.    I will route this recommendation to the requesting party via Epic fax function and remove from pre-op pool.  Please call with questions.       Time:   Today, I have spent 5 minutes with the patient with telehealth technology discussing medical history, symptoms, and management  plan.  Prior to phone evaluation has been greater than 10 minutes reviewing patient's past medical history and cardiac medications.   Deberah Pelton, NP  12/02/2021, 4:05 PM

## 2021-12-02 NOTE — Telephone Encounter (Signed)
Tele add on today ok per Coletta Memos, FNP, pre op provider. Med rec and consent are done.     Patient Consent for Virtual Visit        Travis Huff has provided verbal consent on 12/02/2021 for a virtual visit (video or telephone).   CONSENT FOR VIRTUAL VISIT FOR:  Travis Huff  By participating in this virtual visit I agree to the following:  I hereby voluntarily request, consent and authorize [facility] and its employed or contracted physicians, physician assistants, nurse practitioners or other licensed health care professionals (the Practitioner), to provide me with telemedicine health care services (the "Services") as deemed necessary by the treating Practitioner. I acknowledge and consent to receive the Services by the Practitioner via telemedicine. I understand that the telemedicine visit will involve communicating with the Practitioner through live audiovisual communication technology and the disclosure of certain medical information by electronic transmission. I acknowledge that I have been given the opportunity to request an in-person assessment or other available alternative prior to the telemedicine visit and am voluntarily participating in the telemedicine visit.  I understand that I have the right to withhold or withdraw my consent to the use of telemedicine in the course of my care at any time, without affecting my right to future care or treatment, and that the Practitioner or I may terminate the telemedicine visit at any time. I understand that I have the right to inspect all information obtained and/or recorded in the course of the telemedicine visit and may receive copies of available information for a reasonable fee.  I understand that some of the potential risks of receiving the Services via telemedicine include:  Delay or interruption in medical evaluation due to technological equipment failure or disruption; Information transmitted may not be sufficient  (e.g. poor resolution of images) to allow for appropriate medical decision making by the Practitioner; and/or  In rare instances, security protocols could fail, causing a breach of personal health information.  Furthermore, I acknowledge that it is my responsibility to provide information about my medical history, conditions and care that is complete and accurate to the best of my ability. I acknowledge that Practitioner's advice, recommendations, and/or decision may be based on factors not within their control, such as incomplete or inaccurate data provided by me or distortions of diagnostic images or specimens that may result from electronic transmissions. I understand that the practice of medicine is not an exact science and that Practitioner makes no warranties or guarantees regarding treatment outcomes. I acknowledge that a copy of this consent can be made available to me via my patient portal ([facility]), or I can request a printed copy by calling the office of [facility].    I understand that my insurance will be billed for this visit.   I have read or had this consent read to me. I understand the contents of this consent, which adequately explains the benefits and risks of the Services being provided via telemedicine.  I have been provided ample opportunity to ask questions regarding this consent and the Services and have had my questions answered to my satisfaction. I give my informed consent for the services to be provided through the use of telemedicine in my medical care

## 2021-12-07 DIAGNOSIS — D123 Benign neoplasm of transverse colon: Secondary | ICD-10-CM | POA: Diagnosis not present

## 2021-12-07 DIAGNOSIS — K648 Other hemorrhoids: Secondary | ICD-10-CM | POA: Diagnosis not present

## 2021-12-07 DIAGNOSIS — Z1211 Encounter for screening for malignant neoplasm of colon: Secondary | ICD-10-CM | POA: Diagnosis not present

## 2021-12-08 LAB — HM COLONOSCOPY

## 2021-12-12 ENCOUNTER — Encounter (INDEPENDENT_AMBULATORY_CARE_PROVIDER_SITE_OTHER): Payer: Self-pay

## 2021-12-12 ENCOUNTER — Ambulatory Visit (INDEPENDENT_AMBULATORY_CARE_PROVIDER_SITE_OTHER): Payer: 59 | Admitting: Family Medicine

## 2021-12-14 ENCOUNTER — Encounter (INDEPENDENT_AMBULATORY_CARE_PROVIDER_SITE_OTHER): Payer: Self-pay | Admitting: Family Medicine

## 2021-12-14 ENCOUNTER — Other Ambulatory Visit (HOSPITAL_BASED_OUTPATIENT_CLINIC_OR_DEPARTMENT_OTHER): Payer: Self-pay

## 2021-12-14 ENCOUNTER — Other Ambulatory Visit (INDEPENDENT_AMBULATORY_CARE_PROVIDER_SITE_OTHER): Payer: Self-pay

## 2021-12-14 MED ORDER — WEGOVY 1.7 MG/0.75ML ~~LOC~~ SOAJ
1.7000 mg | SUBCUTANEOUS | 0 refills | Status: DC
  Filled 2021-12-14: qty 3, 28d supply, fill #0

## 2021-12-18 ENCOUNTER — Other Ambulatory Visit (HOSPITAL_COMMUNITY): Payer: Self-pay | Admitting: Cardiology

## 2021-12-19 ENCOUNTER — Encounter: Payer: Self-pay | Admitting: Cardiology

## 2021-12-19 ENCOUNTER — Other Ambulatory Visit: Payer: Self-pay

## 2021-12-19 ENCOUNTER — Ambulatory Visit: Payer: 59 | Attending: Cardiology | Admitting: Cardiology

## 2021-12-19 ENCOUNTER — Other Ambulatory Visit (HOSPITAL_BASED_OUTPATIENT_CLINIC_OR_DEPARTMENT_OTHER): Payer: Self-pay

## 2021-12-19 VITALS — BP 150/90 | HR 74 | Ht 71.0 in | Wt 213.0 lb

## 2021-12-19 DIAGNOSIS — I480 Paroxysmal atrial fibrillation: Secondary | ICD-10-CM

## 2021-12-19 DIAGNOSIS — I10 Essential (primary) hypertension: Secondary | ICD-10-CM

## 2021-12-19 DIAGNOSIS — D6869 Other thrombophilia: Secondary | ICD-10-CM | POA: Diagnosis not present

## 2021-12-19 DIAGNOSIS — I2510 Atherosclerotic heart disease of native coronary artery without angina pectoris: Secondary | ICD-10-CM

## 2021-12-19 MED ORDER — APIXABAN 5 MG PO TABS
5.0000 mg | ORAL_TABLET | Freq: Two times a day (BID) | ORAL | 1 refills | Status: DC
  Filled 2021-12-19 – 2022-01-02 (×2): qty 180, 90d supply, fill #0
  Filled 2022-03-30: qty 180, 90d supply, fill #1

## 2021-12-19 MED ORDER — MULTAQ 400 MG PO TABS
400.0000 mg | ORAL_TABLET | Freq: Two times a day (BID) | ORAL | 3 refills | Status: DC
  Filled 2021-12-19: qty 90, 45d supply, fill #0

## 2021-12-19 MED ORDER — LISINOPRIL-HYDROCHLOROTHIAZIDE 20-12.5 MG PO TABS
2.0000 | ORAL_TABLET | Freq: Every day | ORAL | 6 refills | Status: DC
  Filled 2021-12-19: qty 60, 30d supply, fill #0
  Filled 2022-01-08 – 2022-01-12 (×2): qty 60, 30d supply, fill #1
  Filled 2022-02-13: qty 60, 30d supply, fill #2
  Filled 2022-03-12: qty 60, 30d supply, fill #3
  Filled 2022-04-09: qty 60, 30d supply, fill #4
  Filled 2022-05-11: qty 60, 30d supply, fill #5
  Filled 2022-06-09: qty 60, 30d supply, fill #6

## 2021-12-19 MED ORDER — MULTAQ 400 MG PO TABS
400.0000 mg | ORAL_TABLET | Freq: Two times a day (BID) | ORAL | 6 refills | Status: DC
  Filled 2021-12-19: qty 60, 30d supply, fill #0
  Filled 2022-03-12: qty 60, 30d supply, fill #1
  Filled 2022-04-07: qty 60, 30d supply, fill #2
  Filled 2022-05-11: qty 60, 30d supply, fill #3
  Filled 2022-06-09: qty 60, 30d supply, fill #4
  Filled 2022-07-07: qty 60, 30d supply, fill #5
  Filled 2022-08-12: qty 60, 30d supply, fill #6

## 2021-12-19 NOTE — Telephone Encounter (Signed)
Prescription refill request for Eliquis received. Indication:afib Last office visit:12/23 Scr:0.9 Age: 61 Weight:95.3  kg  Prescription refilled

## 2021-12-19 NOTE — Progress Notes (Signed)
Electrophysiology Office Note   Date:  12/19/2021   ID:  Travis Huff, DOB 10/15/1960, MRN [id number]  PCP:  Silverio Decamp, MD  Cardiologist: Tamala Julian Primary Electrophysiologist:  Aleeha Boline Meredith Leeds, MD    No chief complaint on file.     History of Present Illness: Travis Huff is a 61 y.o. male who is being seen today for the evaluation of atrial fibrillation at the request of Silverio Decamp Presenting today for electrophysiology evaluation.    He has a history significant anxiety, hypertension, obesity, multiple PEs on Eliquis.  He is post IVC filter, non-STEMI due to coronary dissection, atrial fibrillation, chronic combined systolic and diastolic heart failure.  His ejection fraction has normalized.  Left heart catheterization showed an LAD dissection with apical narrowing.  He wore a cardiac monitor that showed rapid atrial fibrillation.  He has been on Multaq.  Today, denies symptoms of palpitations, chest pain, shortness of breath, orthopnea, PND, lower extremity edema, claudication, dizziness, presyncope, syncope, bleeding, or neurologic sequela. The patient is tolerating medications without difficulties.  He has been having issues with headaches.  He feels that his headaches are due to elevated blood pressures.  Blood pressures have been in the 150s over 90s at home and little bit higher than mine.  He would like to adjust medications to get him more controlled.   Past Medical History:  Diagnosis Date   Arrhythmia    Arthritis    shoulders, knees,  back,hips   Atrial fibrillation (HCC)    Avascular necrosis of bone of left hip (HCC)    Back pain    Balance problem    Blood clot in vein 06/2016   x 2 no bleeding disorders found after back surgery   Complication of anesthesia    ketamine hallucinations   Constipation    Coronary artery disease    Dislocated hip, left, initial encounter ([place]) 10/03/2018   Dysrhythmia 2020   a-fib   Hx of  blood clots    Hypertension    Implantable loop recorder present    Joint pain    Neuromuscular disorder (HCC)    neuropathy bi lat legs knee down   Neuropathy    PAF (paroxysmal atrial fibrillation) (HCC)    Pre-diabetes    Prediabetes    Shakes    SOBOE (shortness of breath on exertion)    Swelling of both lower extremities    Viral pericarditis 20 yrs ago   Past Surgical History:  Procedure Laterality Date   ABDOMINAL EXPOSURE N/A 06/23/2016   Procedure: ABDOMINAL EXPOSURE;  Surgeon: Rosetta Posner, MD;  Location: [facility];  Service: Vascular;  Laterality: N/A;   ANTERIOR LATERAL LUMBAR FUSION 4 LEVELS Right 06/23/2016   Procedure: Right  Lumbar two-three Lumbar three-four Lumbar four-five Anterolateral lumbar interbody fusion;  Surgeon: Erline Levine, MD;  Location: [facility];  Service: Neurosurgery;  Laterality: Right;   ANTERIOR LUMBAR FUSION N/A 06/23/2016   Procedure: Lumbar five -sacral one  Anterior lumbar interbody fusion with Dr. Sherren Mocha Early for approach;  Surgeon: Erline Levine, MD;  Location: [facility];  Service: Neurosurgery;  Laterality: N/A;   APPLICATION OF INTRAOPERATIVE CT SCAN N/A 06/26/2016   Procedure: APPLICATION OF INTRAOPERATIVE CT SCAN;  Surgeon: Erline Levine, MD;  Location: [facility];  Service: Neurosurgery;  Laterality: N/A;   BACK SURGERY  2018   CARDIAC CATHETERIZATION     CARDIOVASCULAR STRESS TEST     Negative in May 2014   COLONOSCOPY  HERNIA REPAIR     umbilical   HIP CLOSED REDUCTION Left 10/03/2018   Procedure: CLOSED REDUCTION HIP;  Surgeon: Rod Can, MD;  Location: [facility];  Service: Orthopedics;  Laterality: Left;   IR IVC FILTER PLMT / S&I /IMG GUID/MOD SED  03/26/2018   IR RADIOLOGIST EVAL & MGMT  04/16/2019   JOINT REPLACEMENT     LEFT HEART CATH AND CORONARY ANGIOGRAPHY N/A 06/21/2018   Procedure: LEFT HEART CATH AND CORONARY ANGIOGRAPHY;  Surgeon: Jettie Booze, MD;  Location: [facility];  Service: Cardiovascular;  Laterality: N/A;    POSTERIOR LUMBAR FUSION 4 LEVEL N/A 06/26/2016   Procedure: Thoracic ten to Pelvis fixation with AIRO;  Surgeon: Erline Levine, MD;  Location: [facility];  Service: Neurosurgery;  Laterality: N/A;   REVERSE SHOULDER ARTHROPLASTY Right 09/18/2019   Procedure: REVERSE SHOULDER ARTHROPLASTY;  Surgeon: Justice Britain, MD;  Location: [facility];  Service: Orthopedics;  Laterality: Right;  147mn   REVERSE SHOULDER ARTHROPLASTY Left 06/10/2020   Procedure: REVERSE SHOULDER ARTHROPLASTY;  Surgeon: SJustice Britain MD;  Location: [facility];  Service: Orthopedics;  Laterality: Left;  1252m   TENDON REPAIR     right hand   TOTAL HIP ARTHROPLASTY Right 05/25/2014   Procedure: RIGHT TOTAL HIP ARTHROPLASTY ANTERIOR APPROACH;  Surgeon: BrRod CanMD;  [facility] Service: Orthopedics;  Laterality: Right;   TOTAL HIP ARTHROPLASTY Left 03/27/2018   Procedure: TOTAL HIP ARTHROPLASTY ANTERIOR APPROACH- DA complex;  Surgeon: SwRod CanMD;  Location: [facility];  Service: Orthopedics;  Laterality: Left;   TOTAL KNEE ARTHROPLASTY Bilateral    TOTAL SHOULDER REVISION Left 10/14/2020   Procedure: Revision Left reverse shoulder arthroplasty;  Surgeon: SuJustice BritainMD;  Location: [facility];  Service: Orthopedics;  Laterality: Left;  12059m    Current Outpatient Medications  Medication Sig Dispense Refill   acetaminophen (TYLENOL) 500 MG tablet Take 1,000 mg by mouth every 6 (six) hours as needed for mild pain or headache.     apixaban (ELIQUIS) 5 MG TABS tablet Take 1 tablet (5 mg total) by mouth 2 (two) times daily. 180 tablet 1   atorvastatin (LIPITOR) 80 MG tablet TAKE 1 TABLET BY MOUTH DAILY AT 6PM 90 tablet 3   Cyanocobalamin (B-12) 500 MCG TABS 300 mcg- 500 mcg qd B12 90 tablet 0   cyclobenzaprine (FLEXERIL) 10 MG tablet Take 1 tablet (10 mg total) by mouth at bedtime. 90 tablet 3   diltiazem (CARTIA XT) 120 MG 24 hr capsule TAKE 1 CAPSULE (120 MG TOTAL) BY MOUTH DAILY. 90 capsule 3   docusate sodium (COLACE) 100  MG capsule Take 1 capsule (100 mg total) by mouth 2 (two) times daily. 60 capsule 1   lisinopril-hydrochlorothiazide (ZESTORETIC) 20-25 MG tablet Take 0.5 tablets by mouth daily. 90 tablet 3   metoprolol succinate (TOPROL-XL) 50 MG 24 hr tablet Take 1 tablet (50 mg total) by mouth daily. Take with or immediately following a meal. 90 tablet 3   Semaglutide-Weight Management (WEGOVY) 1.7 MG/0.75ML SOAJ Inject 1.7 mg into the skin once a week. 3 mL 0   sildenafil (VIAGRA) 100 MG tablet Take 1 tablet (100 mg total) by mouth daily as needed. Avoid use with nitroglycerin 30 tablet 11   zolpidem (AMBIEN) 10 MG tablet TAKE 1 TABLET BY MOUTH EVERY NIGHT AT BEDTIME AS NEEDED FOR SLEEP 90 tablet 1   dronedarone (MULTAQ) 400 MG tablet Take 1 tablet (400 mg total) by mouth 2 (two) times daily with  a meal. Please contact our office to schedule an overdue appointment with Dr. Curt Bears for any future refills, thank you. [phone]. Final attempt. 90 tablet 3   No current facility-administered medications for this visit.    Allergies:   Patient has no known allergies.   Social History:  The patient  reports that he quit smoking about 23 years ago. His smoking use included cigarettes. He has a 25.00 pack-year smoking history. His smokeless tobacco use includes snuff. He reports current alcohol use of about 14.0 standard drinks of alcohol per week. He reports that he does not use drugs.   Family History:  The patient's family history includes Healthy in his daughter and daughter. He was adopted.   ROS:  Please see the history of present illness.   Otherwise, review of systems is positive for none.   All other systems are reviewed and negative.   PHYSICAL EXAM: VS:  BP (!) 150/92   Pulse 74   Ht '5\' 11"'$  (1.803 m)   Wt 213 lb (96.6 kg)   SpO2 95%   BMI 29.71 kg/m  , BMI Body mass index is 29.71 kg/m. GEN: Well nourished, well developed, in no acute distress  HEENT: normal  Neck: no JVD, carotid bruits, or  masses Cardiac: RRR; no murmurs, rubs, or gallops,no edema  Respiratory:  clear to auscultation bilaterally, normal work of breathing GI: soft, nontender, nondistended, + BS MS: no deformity or atrophy  Skin: warm and dry, device site well healed Neuro:  Strength and sensation are intact Psych: euthymic mood, full affect  EKG:  EKG is ordered today. Personal review of the ekg ordered shows sinus rhythm, PVC  Personal review of the device interrogation today. Results in [facility]: 02/21/2021: Hemoglobin 16.4; Platelets 192; TSH 2.440 09/28/2021: ALT 28; BUN 20; Creatinine, Ser 0.90; Potassium 4.4; Sodium 139    Lipid Panel     Component Value Date/Time   CHOL 148 09/28/2021 1100   TRIG 61 09/28/2021 1100   HDL 80 09/28/2021 1100   CHOLHDL 1.9 09/28/2021 1100   CHOLHDL 2.8 11/20/2018 1147   VLDL 15 05/23/2012 0610   LDLCALC 55 09/28/2021 1100   LDLCALC 76 11/20/2018 1147     Wt Readings from Last 3 Encounters:  12/19/21 213 lb (96.6 kg)  10/31/21 210 lb (95.3 kg)  09/28/21 214 lb (97.1 kg)      Other studies Reviewed: Additional studies/ records that were reviewed today include: TTE 09/30/2018 Review of the above records today demonstrates:   1. Left ventricular ejection fraction, by visual estimation, is 65 to 70%. The left ventricle has normal function. Normal left ventricular size. There is mildly increased left ventricular hypertrophy.  2. Definity contrast agent was given IV to delineate the left ventricular endocardial borders.  3. Left ventricular diastolic Doppler parameters are indeterminate pattern of LV diastolic filling.  4. Global right ventricle has normal systolic function.The right ventricular size is normal. No increase in right ventricular wall thickness.  5. The left ventricular function has improved from previous echo  [facility] 06/21/18 Dist LAD lesion is 75% stenosed. 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 There is mild left  ventricular systolic dysfunction. The left ventricular ejection fraction is 35-45% by visual estimate. There is no aortic valve stenosis.    ASSESSMENT AND PLAN:  1.  Paroxysmal atrial fibrillation: Currently on Eliquis 5 mg twice daily.  CHA2DS2-VASc of 2.  He was previously on Multaq but stopped.  Pharmacy would  not refill it.  Nazir Hacker refill his Multaq as he would prefer to be on an antiarrhythmic.  Otherwise no changes.  2.  Coronary artery disease: Distal LAD stenosis.  No current chest pain.  3.  Cardiomyopathy: Ejection fraction has improved  4.  Hypertension: Julietta Batterman increase lisinopril/HCTZ 20.  Otherwise no changes  5.  Secondary to coagula state: Currently on Eliquis for atrial fibrillation as above   Current medicines are reviewed at length with the patient today.   The patient does not have concerns regarding his medicines.  The following changes were made today: Increase lisinopril  Labs/ tests ordered today include:  No orders of the defined types were placed in this encounter.    Disposition:   FU with Tamekia Rotter 6 months  Signed, Evany Schecter Meredith Leeds, MD  12/19/2021 4:02 PM     [address] [place] [id number] [phone] (office) [phone] (fax)

## 2021-12-19 NOTE — Patient Instructions (Addendum)
Medication Instructions:  Your physician has recommended you make the following change in your medication:  CHANGE the way you take Lisinopril-Hydrochlorothiazide 40/25 mg -- take 2 tablet daily  *If you need a refill on your cardiac medications before your next appointment, please call your pharmacy*   Lab Work: None ordered   Testing/Procedures: None ordered   Follow-Up: At [facility], you and your health needs are our priority.  As part of our continuing mission to provide you with exceptional heart care, we have created designated Provider Care Teams.  These Care Teams include your primary Cardiologist (physician) and Advanced Practice Providers (APPs -  Physician Assistants and Nurse Practitioners) who all work together to provide you with the care you need, when you need it.  Your next appointment:   6 month(s)  The format for your next appointment:   In Person  Provider:   Allegra Lai, MD    Thank you for choosing [facility]!!   [name], RN [phone]  Other Instructions   Important Information About Sugar

## 2021-12-28 NOTE — Addendum Note (Signed)
Addended by: Janan Halter F on: 12/28/2021 11:52 AM   Modules accepted: Orders

## 2021-12-29 ENCOUNTER — Ambulatory Visit (INDEPENDENT_AMBULATORY_CARE_PROVIDER_SITE_OTHER): Payer: 59

## 2021-12-29 DIAGNOSIS — I255 Ischemic cardiomyopathy: Secondary | ICD-10-CM | POA: Diagnosis not present

## 2021-12-29 LAB — CUP PACEART REMOTE DEVICE CHECK
Date Time Interrogation Session: 20231227231339
Implantable Pulse Generator Implant Date: 20220727

## 2022-01-02 ENCOUNTER — Other Ambulatory Visit: Payer: Self-pay

## 2022-01-02 ENCOUNTER — Other Ambulatory Visit (HOSPITAL_BASED_OUTPATIENT_CLINIC_OR_DEPARTMENT_OTHER): Payer: Self-pay

## 2022-01-04 ENCOUNTER — Other Ambulatory Visit (HOSPITAL_BASED_OUTPATIENT_CLINIC_OR_DEPARTMENT_OTHER): Payer: Self-pay

## 2022-01-04 ENCOUNTER — Encounter: Payer: Self-pay | Admitting: Interventional Cardiology

## 2022-01-04 MED ORDER — FLUARIX QUADRIVALENT 0.5 ML IM SUSY
PREFILLED_SYRINGE | INTRAMUSCULAR | 0 refills | Status: DC
  Filled 2022-01-04: qty 0.5, 1d supply, fill #0

## 2022-01-09 ENCOUNTER — Other Ambulatory Visit (HOSPITAL_BASED_OUTPATIENT_CLINIC_OR_DEPARTMENT_OTHER): Payer: Self-pay

## 2022-01-10 ENCOUNTER — Other Ambulatory Visit: Payer: Self-pay | Admitting: Cardiology

## 2022-01-10 ENCOUNTER — Encounter (INDEPENDENT_AMBULATORY_CARE_PROVIDER_SITE_OTHER): Payer: Self-pay | Admitting: Family Medicine

## 2022-01-10 ENCOUNTER — Other Ambulatory Visit (HOSPITAL_BASED_OUTPATIENT_CLINIC_OR_DEPARTMENT_OTHER): Payer: Self-pay

## 2022-01-10 ENCOUNTER — Ambulatory Visit (INDEPENDENT_AMBULATORY_CARE_PROVIDER_SITE_OTHER): Payer: Commercial Managed Care - PPO | Admitting: Family Medicine

## 2022-01-10 VITALS — BP 117/77 | Ht 71.0 in | Wt 215.0 lb

## 2022-01-10 DIAGNOSIS — Z6829 Body mass index (BMI) 29.0-29.9, adult: Secondary | ICD-10-CM | POA: Diagnosis not present

## 2022-01-10 DIAGNOSIS — E669 Obesity, unspecified: Secondary | ICD-10-CM | POA: Diagnosis not present

## 2022-01-10 DIAGNOSIS — I10 Essential (primary) hypertension: Secondary | ICD-10-CM | POA: Diagnosis not present

## 2022-01-10 DIAGNOSIS — R7303 Prediabetes: Secondary | ICD-10-CM | POA: Diagnosis not present

## 2022-01-10 MED ORDER — SEMAGLUTIDE-WEIGHT MANAGEMENT 2.4 MG/0.75ML ~~LOC~~ SOAJ
2.4000 mg | SUBCUTANEOUS | 0 refills | Status: DC
  Filled 2022-01-10: qty 3, 28d supply, fill #0

## 2022-01-10 MED ORDER — METOPROLOL SUCCINATE ER 50 MG PO TB24
50.0000 mg | ORAL_TABLET | Freq: Every day | ORAL | 3 refills | Status: DC
  Filled 2022-01-10: qty 90, 90d supply, fill #0
  Filled 2022-04-23: qty 90, 90d supply, fill #1
  Filled 2022-07-20: qty 90, 90d supply, fill #2
  Filled 2022-10-21: qty 90, 90d supply, fill #3

## 2022-01-10 NOTE — Progress Notes (Signed)
[facility] Summary Report / Loop Recorder 

## 2022-01-17 NOTE — Progress Notes (Signed)
Carelink Summary Report / Loop Recorder

## 2022-01-26 ENCOUNTER — Other Ambulatory Visit (HOSPITAL_BASED_OUTPATIENT_CLINIC_OR_DEPARTMENT_OTHER): Payer: Self-pay

## 2022-01-26 ENCOUNTER — Other Ambulatory Visit (HOSPITAL_COMMUNITY): Payer: Self-pay | Admitting: Sports Medicine

## 2022-01-26 DIAGNOSIS — F5101 Primary insomnia: Secondary | ICD-10-CM

## 2022-01-26 MED ORDER — ZOLPIDEM TARTRATE 10 MG PO TABS
10.0000 mg | ORAL_TABLET | Freq: Every evening | ORAL | 1 refills | Status: DC | PRN
  Filled 2022-01-26: qty 90, fill #0
  Filled 2022-01-27: qty 90, 90d supply, fill #0
  Filled 2022-04-23: qty 90, 90d supply, fill #1

## 2022-01-27 ENCOUNTER — Other Ambulatory Visit: Payer: Self-pay

## 2022-01-27 ENCOUNTER — Other Ambulatory Visit (HOSPITAL_BASED_OUTPATIENT_CLINIC_OR_DEPARTMENT_OTHER): Payer: Self-pay

## 2022-01-31 ENCOUNTER — Ambulatory Visit: Payer: Commercial Managed Care - PPO

## 2022-01-31 DIAGNOSIS — I255 Ischemic cardiomyopathy: Secondary | ICD-10-CM | POA: Diagnosis not present

## 2022-01-31 LAB — CUP PACEART REMOTE DEVICE CHECK
Date Time Interrogation Session: 20240127230544
Implantable Pulse Generator Implant Date: 20220727

## 2022-02-01 ENCOUNTER — Ambulatory Visit (INDEPENDENT_AMBULATORY_CARE_PROVIDER_SITE_OTHER): Payer: Commercial Managed Care - PPO | Admitting: Family Medicine

## 2022-02-01 ENCOUNTER — Encounter (INDEPENDENT_AMBULATORY_CARE_PROVIDER_SITE_OTHER): Payer: Self-pay | Admitting: Family Medicine

## 2022-02-01 ENCOUNTER — Other Ambulatory Visit (HOSPITAL_BASED_OUTPATIENT_CLINIC_OR_DEPARTMENT_OTHER): Payer: Self-pay

## 2022-02-01 VITALS — BP 132/91 | HR 75 | Temp 98.2°F | Ht 71.0 in | Wt 207.2 lb

## 2022-02-01 DIAGNOSIS — R7303 Prediabetes: Secondary | ICD-10-CM

## 2022-02-01 DIAGNOSIS — Z6828 Body mass index (BMI) 28.0-28.9, adult: Secondary | ICD-10-CM

## 2022-02-01 MED ORDER — SEMAGLUTIDE-WEIGHT MANAGEMENT 2.4 MG/0.75ML ~~LOC~~ SOAJ
2.4000 mg | SUBCUTANEOUS | 0 refills | Status: DC
  Filled 2022-02-01 – 2022-02-07 (×2): qty 3, 28d supply, fill #0

## 2022-02-02 ENCOUNTER — Ambulatory Visit: Payer: 59

## 2022-02-02 NOTE — Progress Notes (Signed)
TeleHealth Visit:  Due to the COVID-19 pandemic, this visit was completed with telemedicine (audio/video) technology to reduce patient and provider exposure as well as to preserve personal protective equipment.   Travis Huff has verbally consented to this TeleHealth visit. The patient is located at home, the provider is located at the [facility] office. The participants in this visit include the listed provider and patient. The visit was conducted today via MyChart video.   Chief Complaint: OBESITY Travis Huff is here to discuss his progress with his obesity treatment plan along with follow-up of his obesity related diagnoses. Travis Huff is on the Category 4 Plan and states he is following his eating plan approximately 85% of the time. Travis Huff states he is not currently exercising.  Today's visit was #: 66 Starting weight: 350 lbs Starting date: 02/03/2020  Interim History: Travis Huff states that he "jumped" on the scale today and he is up 5 lbs from mid December until now. He has had more sweets, chocolate, bread, etc over the holidays. He also hasn't exercised much as well. He is motivated to get back on track.  Subjective:   1. Pre-diabetes Patient has had more cravings lately due to increased intake of simple carbohydrates. He has been on 1.7 mg weekly for several months and has felt stagnate/stuck.   2. Essential hypertension Patient's blood pressure at home was 117/77 and well controlled. He cut coffee intake/caffeine in half, and this has helped decrease headaches and also decrease blood pressure.   Assessment/Plan:  No orders of the defined types were placed in this encounter.   Medications Discontinued During This Encounter  Medication Reason   Semaglutide-Weight Management (WEGOVY) 1.7 MG/0.75ML SOAJ Dose change     Meds ordered this encounter  Medications   DISCONTD: Semaglutide-Weight Management 2.4 MG/0.75ML SOAJ    Sig: Inject 2.4 mg into the skin once a week on Friday     Dispense:  3 mL    Refill:  0     1. Pre-diabetes Patient agreed to increase Wegovy from 1.7 mg weekly to 2.4 mg weekly. Risks and benefits of the medication was discussed with the patient. Continue prudent nutritional plan with increased protein and decreased simple carbohydrates.   2. Essential hypertension Patient will continue his medications per PCP, and will continue his prudent nutritional plan and weight loss.   3. Obesity, current BMI 29.3 Travis Huff is currently in the action stage of change. As such, his goal is to continue with weight loss efforts. He has agreed to the Category 4 Plan.   Next office visit in-person. Continue prudent nutritional plan and increase exercise as tolerated.   Exercise goals: All adults should avoid inactivity. Some physical activity is better than none, and adults who participate in any amount of physical activity gain some health benefits.  Behavioral modification strategies: increasing lean protein intake and decreasing simple carbohydrates.  Travis Huff has agreed to follow-up with our clinic in 3 to 4 weeks. He was informed of the importance of frequent follow-up visits to maximize his success with intensive lifestyle modifications for his multiple health conditions.  Objective:   VITALS: Per patient if applicable, see vitals. GENERAL: Alert and in no acute distress. CARDIOPULMONARY: No increased WOB. Speaking in clear sentences.  PSYCH: Pleasant and cooperative. Speech normal rate and rhythm. Affect is appropriate. Insight and judgement are appropriate. Attention is focused, linear, and appropriate.  NEURO: Oriented as arrived to appointment on time with no prompting.   Lab Results  Component Value  Date   CREATININE 0.90 09/28/2021   BUN 20 09/28/2021   NA 139 09/28/2021   K 4.4 09/28/2021   CL 100 09/28/2021   CO2 22 09/28/2021   Lab Results  Component Value Date   ALT 28 09/28/2021   AST 36 09/28/2021   ALKPHOS 82 09/28/2021   BILITOT  0.5 09/28/2021   Lab Results  Component Value Date   HGBA1C 5.1 09/28/2021   HGBA1C 5.4 01/10/2021   HGBA1C 5.5 09/16/2020   HGBA1C 5.8 (H) 05/28/2020   HGBA1C 6.0 (H) 02/03/2020   Lab Results  Component Value Date   INSULIN 5.8 09/28/2021   INSULIN 10.3 02/21/2021   INSULIN 17.2 02/03/2020   Lab Results  Component Value Date   TSH 2.440 02/21/2021   Lab Results  Component Value Date   CHOL 148 09/28/2021   HDL 80 09/28/2021   LDLCALC 55 09/28/2021   TRIG 61 09/28/2021   CHOLHDL 1.9 09/28/2021   Lab Results  Component Value Date   VD25OH 50.4 09/28/2021   VD25OH 46.4 01/10/2021   VD25OH 60.9 08/30/2020   Lab Results  Component Value Date   WBC 5.8 02/21/2021   HGB 16.4 02/21/2021   HCT 46.4 02/21/2021   MCV 94 02/21/2021   PLT 192 02/21/2021   No results found for: "IRON", "TIBC", "FERRITIN"  Attestation Statements:   Reviewed by clinician on day of visit: allergies, medications, problem list, medical history, surgical history, family history, social history, and previous encounter notes.   Wilhemena Durie, am acting as transcriptionist for [employer], DO.  I have reviewed the above documentation for accuracy and completeness, and I agree with the above. Marjory Sneddon, D.O.  The [place] was signed into law in 2016 which includes the topic of electronic health records.  This provides immediate access to information in MyChart.  This includes consultation notes, operative notes, office notes, lab results and pathology reports.  If you have any questions about what you read please let us know at your next visit so we can discuss your concerns and take corrective action if need be.  We are right here with you.

## 2022-02-08 ENCOUNTER — Other Ambulatory Visit (HOSPITAL_BASED_OUTPATIENT_CLINIC_OR_DEPARTMENT_OTHER): Payer: Self-pay

## 2022-02-09 ENCOUNTER — Other Ambulatory Visit (HOSPITAL_BASED_OUTPATIENT_CLINIC_OR_DEPARTMENT_OTHER): Payer: Self-pay

## 2022-02-13 ENCOUNTER — Other Ambulatory Visit (HOSPITAL_BASED_OUTPATIENT_CLINIC_OR_DEPARTMENT_OTHER): Payer: Self-pay

## 2022-02-13 ENCOUNTER — Encounter: Payer: Self-pay | Admitting: Sports Medicine

## 2022-02-20 ENCOUNTER — Other Ambulatory Visit (HOSPITAL_BASED_OUTPATIENT_CLINIC_OR_DEPARTMENT_OTHER): Payer: Self-pay

## 2022-02-20 ENCOUNTER — Other Ambulatory Visit: Payer: Self-pay

## 2022-02-21 NOTE — Progress Notes (Unsigned)
Chief Complaint:   OBESITY Travis Huff is here to discuss his progress with his obesity treatment plan along with follow-up of his obesity related diagnoses. Travis Huff is on the Category 4 Plan and states he is following his eating plan approximately 85-90% of the time. Travis Huff states he is not currently exercising.  Today's visit was #: 51 Starting weight: 350 LBS Starting date: 02/03/2020 Today's weight: 207 LBS Today's date: 02/01/2022 Total lbs lost to date: 143 LBS Total lbs lost since last in-office visit: 3  Interim History: Travis Huff is at 1-2 beers a day/ now using sugar-free [employer] candies for cravings at 5 calories per piece.  Goal weight =190-195 lbs;by the time he goes to [place] in May.  Subjective:   1. Pre-diabetes Labs discussed during visit today.  We increased to 2.4 Wegovy at last office visit.  Tolerated well and helping with cravings.  Patient down 8 lbs and is able to stay on track more.    Assessment/Plan:  No orders of the defined types were placed in this encounter.   Medications Discontinued During This Encounter  Medication Reason   Semaglutide-Weight Management 2.4 MG/0.75ML SOAJ Reorder     Meds ordered this encounter  Medications   Semaglutide-Weight Management 2.4 MG/0.75ML SOAJ    Sig: Inject 2.4 mg into the skin once a week on Friday    Dispense:  3 mL    Refill:  0     1. Pre-diabetes Continue Prescribed Nutrition Plan and exercise to promote weight loss.   Increase activity as tolerated.  A1c 5.1 3+ months ago.  2. BMI 28.0-28.9,adult-current bmi 28.9  3. Morbid obesity (HCC)-starting bmi 48.89 Will refill Wegovy 2.4 mg SQ weekly for 1 month with 0 refills.  Patient tolerating well and it is helping.  -Refill Semaglutide-Weight Management 2.4 MG/0.75ML SOAJ; Inject 2.4 mg into the skin once a week on Friday  Dispense: 3 mL; Refill: 0  Travis Huff is currently in the action stage of change. As such, his goal is to continue with weight loss efforts.  He has agreed to the Category 4 Plan.   Exercise goals: As is.  Exercise 20-30 minutes, 3 days a week.  5 pound goal for next office visit.  Behavioral modification strategies: avoiding temptations and planning for success.  Travis Huff has agreed to follow-up with our clinic in 3 weeks. He was informed of the importance of frequent follow-up visits to maximize his success with intensive lifestyle modifications for his multiple health conditions.   Objective:   Blood pressure (!) 132/91, pulse 75, temperature 98.2 F (36.8 C), height 5' 11"$  (1.803 m), SpO2 98 %. Body mass index is 29.99 kg/m.  General: Cooperative, alert, well developed, in no acute distress. HEENT: Conjunctivae and lids unremarkable. Cardiovascular: Regular rhythm.  Lungs: Normal work of breathing. Neurologic: No focal deficits.   Lab Results  Component Value Date   CREATININE 0.90 09/28/2021   BUN 20 09/28/2021   NA 139 09/28/2021   K 4.4 09/28/2021   CL 100 09/28/2021   CO2 22 09/28/2021   Lab Results  Component Value Date   ALT 28 09/28/2021   AST 36 09/28/2021   ALKPHOS 82 09/28/2021   BILITOT 0.5 09/28/2021   Lab Results  Component Value Date   HGBA1C 5.1 09/28/2021   HGBA1C 5.4 01/10/2021   HGBA1C 5.5 09/16/2020   HGBA1C 5.8 (H) 05/28/2020   HGBA1C 6.0 (H) 02/03/2020   Lab Results  Component Value Date   INSULIN 5.8 09/28/2021  INSULIN 10.3 02/21/2021   INSULIN 17.2 02/03/2020   Lab Results  Component Value Date   TSH 2.440 02/21/2021   Lab Results  Component Value Date   CHOL 148 09/28/2021   HDL 80 09/28/2021   LDLCALC 55 09/28/2021   TRIG 61 09/28/2021   CHOLHDL 1.9 09/28/2021   Lab Results  Component Value Date   VD25OH 50.4 09/28/2021   VD25OH 46.4 01/10/2021   VD25OH 60.9 08/30/2020   Lab Results  Component Value Date   WBC 5.8 02/21/2021   HGB 16.4 02/21/2021   HCT 46.4 02/21/2021   MCV 94 02/21/2021   PLT 192 02/21/2021   No results found for: "IRON", "TIBC",  "FERRITIN"  Attestation Statements:   Reviewed by clinician on day of visit: allergies, medications, problem list, medical history, surgical history, family history, social history, and previous encounter notes.  I, Brendell Tyus, am acting as Location manager for [employer], DO.   I have reviewed the above documentation for accuracy and completeness, and I agree with the above. Marjory Sneddon, D.O.  The [place] was signed into law in 2016 which includes the topic of electronic health records.  This provides immediate access to information in MyChart.  This includes consultation notes, operative notes, office notes, lab results and pathology reports.  If you have any questions about what you read please let us know at your next visit so we can discuss your concerns and take corrective action if need be.  We are right here with you.

## 2022-02-22 ENCOUNTER — Encounter: Payer: Self-pay | Admitting: Cardiology

## 2022-02-27 ENCOUNTER — Other Ambulatory Visit (HOSPITAL_BASED_OUTPATIENT_CLINIC_OR_DEPARTMENT_OTHER): Payer: Self-pay

## 2022-02-27 MED ORDER — AMOXICILLIN 500 MG PO CAPS
2000.0000 mg | ORAL_CAPSULE | Freq: Once | ORAL | 0 refills | Status: AC
  Filled 2022-02-27: qty 4, 1d supply, fill #0

## 2022-02-28 NOTE — Progress Notes (Signed)
[facility] Summary Report / Loop Recorder 

## 2022-03-01 ENCOUNTER — Ambulatory Visit: Payer: Commercial Managed Care - PPO

## 2022-03-01 DIAGNOSIS — I255 Ischemic cardiomyopathy: Secondary | ICD-10-CM

## 2022-03-01 LAB — CUP PACEART REMOTE DEVICE CHECK
Date Time Interrogation Session: 20240227231021
Implantable Pulse Generator Implant Date: 20220727

## 2022-03-02 DIAGNOSIS — I255 Ischemic cardiomyopathy: Secondary | ICD-10-CM | POA: Diagnosis not present

## 2022-03-06 ENCOUNTER — Ambulatory Visit (INDEPENDENT_AMBULATORY_CARE_PROVIDER_SITE_OTHER): Payer: Commercial Managed Care - PPO | Admitting: Family Medicine

## 2022-03-06 ENCOUNTER — Other Ambulatory Visit (HOSPITAL_BASED_OUTPATIENT_CLINIC_OR_DEPARTMENT_OTHER): Payer: Self-pay

## 2022-03-06 ENCOUNTER — Ambulatory Visit: Payer: 59

## 2022-03-06 ENCOUNTER — Other Ambulatory Visit (INDEPENDENT_AMBULATORY_CARE_PROVIDER_SITE_OTHER): Payer: Self-pay

## 2022-03-06 ENCOUNTER — Encounter (INDEPENDENT_AMBULATORY_CARE_PROVIDER_SITE_OTHER): Payer: Self-pay | Admitting: Family Medicine

## 2022-03-06 ENCOUNTER — Encounter (INDEPENDENT_AMBULATORY_CARE_PROVIDER_SITE_OTHER): Payer: Self-pay

## 2022-03-06 MED ORDER — SEMAGLUTIDE-WEIGHT MANAGEMENT 2.4 MG/0.75ML ~~LOC~~ SOAJ
2.4000 mg | SUBCUTANEOUS | 0 refills | Status: DC
  Filled 2022-03-06: qty 3, 28d supply, fill #0

## 2022-03-08 ENCOUNTER — Encounter: Payer: Self-pay | Admitting: Cardiology

## 2022-03-08 DIAGNOSIS — M79644 Pain in right finger(s): Secondary | ICD-10-CM | POA: Diagnosis not present

## 2022-03-08 DIAGNOSIS — M13831 Other specified arthritis, right wrist: Secondary | ICD-10-CM | POA: Diagnosis not present

## 2022-03-08 DIAGNOSIS — M13832 Other specified arthritis, left wrist: Secondary | ICD-10-CM | POA: Diagnosis not present

## 2022-03-12 DIAGNOSIS — M25531 Pain in right wrist: Secondary | ICD-10-CM | POA: Diagnosis not present

## 2022-03-13 ENCOUNTER — Other Ambulatory Visit (HOSPITAL_BASED_OUTPATIENT_CLINIC_OR_DEPARTMENT_OTHER): Payer: Self-pay

## 2022-03-14 ENCOUNTER — Other Ambulatory Visit (HOSPITAL_BASED_OUTPATIENT_CLINIC_OR_DEPARTMENT_OTHER): Payer: Self-pay

## 2022-03-14 ENCOUNTER — Encounter (INDEPENDENT_AMBULATORY_CARE_PROVIDER_SITE_OTHER): Payer: Self-pay | Admitting: Family Medicine

## 2022-03-14 ENCOUNTER — Ambulatory Visit (INDEPENDENT_AMBULATORY_CARE_PROVIDER_SITE_OTHER): Payer: Commercial Managed Care - PPO | Admitting: Family Medicine

## 2022-03-14 VITALS — BP 127/86 | HR 74 | Temp 98.7°F | Ht 71.0 in | Wt 204.0 lb

## 2022-03-14 DIAGNOSIS — E538 Deficiency of other specified B group vitamins: Secondary | ICD-10-CM

## 2022-03-14 DIAGNOSIS — R7303 Prediabetes: Secondary | ICD-10-CM | POA: Diagnosis not present

## 2022-03-14 DIAGNOSIS — E559 Vitamin D deficiency, unspecified: Secondary | ICD-10-CM

## 2022-03-14 DIAGNOSIS — Z6828 Body mass index (BMI) 28.0-28.9, adult: Secondary | ICD-10-CM

## 2022-03-14 DIAGNOSIS — I10 Essential (primary) hypertension: Secondary | ICD-10-CM

## 2022-03-14 MED ORDER — SEMAGLUTIDE-WEIGHT MANAGEMENT 2.4 MG/0.75ML ~~LOC~~ SOAJ
2.4000 mg | SUBCUTANEOUS | 0 refills | Status: DC
  Filled 2022-03-14 – 2022-04-03 (×2): qty 3, 28d supply, fill #0

## 2022-03-14 NOTE — Progress Notes (Signed)
Travis Huff, D.O.  ABFM, ABOM Specializing in Clinical Bariatric Medicine  Office located at: [address], [place]  [address]     Assessment and Plan:   No orders of the defined types were placed in this encounter.   Medications Discontinued During This Encounter  Medication Reason   Semaglutide-Weight Management 2.4 MG/0.75ML SOAJ Reorder     Meds ordered this encounter  Medications   Semaglutide-Weight Management 2.4 MG/0.75ML SOAJ    Sig: Inject 2.4 mg into the skin once a week on Friday    Dispense:  3 mL    Refill:  0    Will check A1c, B12, CMP, and Vitamin D at next OV.    BMI 28.0-28.9,adult-current bmi 28.5 Morbid obesity (HCC)-starting bmi 48.89/DATE 02/03/20 Assessment: Condition is Improving, but not optimized..  Biometric data collected today, was reviewed with patient.  Fat mass has decreased by 2.6lb. Muscle mass has decreased by .6lb. Total body water has increased by 2.6lb.  He has been doing well with the category 4 meal plan and walking regimen. He has been compliant with Wegovy 2.'4mg'$  weekly without any intolerance- his hunger and cravings are well controlled.  Plan: Continue Wegovy 2.'4mg'$  weekly- provided with refill today. He will continue the category 4 meal plan.   Pre-diabetes Assessment: Condition is At goal.. Labs were reviewed.  Lab Results  Component Value Date   HGBA1C 5.1 09/28/2021   HGBA1C 5.4 01/10/2021   HGBA1C 5.5 09/16/2020   INSULIN 5.8 09/28/2021   INSULIN 10.3 02/21/2021   INSULIN 17.2 02/03/2020  He has been compliant with [id number] 2.'4mg'$  weekly without any intolerance.  Plan: Continue Wegovy 2.'4mg'$  weekly, prudent nutritional plan, and walking regimen. Will recheck A1c at next OV.    Hypertension Assessment: Condition is At goal.. He states that his BP medication was decreased- he is now on Toprol XL '50mg'$  daily decreased from '100mg'$ . He is also taking Diltiazem '120mg'$  daily and Zestoretic 40-'25mg'$   daily. BP Readings from Last 3 Encounters:  03/14/22 127/86  02/01/22 (!) 132/91  01/10/22 117/77    Plan:Continue medication regimen at the recommendations of cardiology Dr. Curt Bears. Will recheck CMP at next OV.   Vitamin D deficiency Assessment: Condition is At goal.. Labs were reviewed. . Diet controlled- not on any Vit D supplements at this time. Lab Results  Component Value Date   VD25OH 50.4 09/28/2021    Plan: Continue current regimen. Will check Vit D levels at next OV.   Vitamin B12 deficiency Assessment: Condition is At goal.. Labs were reviewed. Patient is taking B12 534mg daily- compliant and denies intolerances. Lab Results  Component Value Date   VITAMINB12 591 09/28/2021   Plan:Continue B12 5023m daily. Will recheck B12 level at next OV.     TREATMENT PLAN FOR OBESITY:  Recommended Dietary Goals ScAsers currently in the action stage of change. As such, his goal is to continue weight management plan. He has agreed to continue the Category 4 Plan.  Behavioral Intervention Additional resources provided today:  patient declined Evidence-based interventions for health behavior change were utilized today including the discussion of self monitoring techniques, problem-solving barriers and SMART goal setting techniques.   Regarding patient's less desirable eating habits and patterns, we employed the technique of small changes.  Pt will specifically work on: no changes for next visit.    Recommended Physical Activity Goals ScJayshawnas been advised to work up to 150 minutes of moderate intensity aerobic activity a week and  strengthening exercises 2-3 times per week for cardiovascular health, weight loss maintenance and preservation of muscle mass.  He has agreed to continue physical activity as is.    FOLLOW UP: Return in about 4 weeks (around 04/11/2022).[name] He was informed of the importance of frequent follow up visits to maximize his success with intensive  lifestyle modifications for his multiple health conditions.  Subjective:   Chief complaint: Obesity Travis Huff is here to discuss his progress with his obesity treatment plan. He is on the the Category 4 Plan and states he is following his eating plan approximately 85% of the time. He states he is walking 40-45 minutes 4-5 days per week.  Interval History:  Travis Huff is here for a follow up office visit. We reviewed his meal plan and all questions were answered. Patient's food recall appears to be accurate and consistent with what is on plan when he is following it. When eating on plan, his hunger and cravings are well controlled.     Since last office visit he has increased his walking regimen from 20 to 45 minutes most days of the week. He is walking 3/4 of a mile in his neighborhood, some of his route is uphill. He states that he may not be able to walk more than this due to his neuropathic pain.  Pharmacotherapy for weight loss: He is currently taking Wegovy 2.4mg  weekly for medical weight loss. Denies side effects.    He is considering hand surgery in the future due to severe OA (loss of strength and severe pain) requiring injections.  Review of Systems:  Pertinent positives were addressed with patient today.  Weight Summary and Biometrics   No data recorded   No data recorded  No data recorded  No data recorded  No data recorded   Objective:   PHYSICAL EXAM:  Blood pressure 127/86, pulse 74, temperature 98.7 F (37.1 C), height 5\' 11"  (1.803 m), weight 204 lb (92.5 kg), SpO2 97 %. Body mass index is 28.45 kg/m.  General: Well Developed, well nourished, and in no acute distress.  HEENT: Normocephalic, atraumatic Skin: Warm and dry, cap RF less 2 sec, good turgor Chest:  Normal excursion, shape, no gross abn Respiratory: speaking in full sentences, no conversational dyspnea NeuroM-Sk: Ambulates w/o assistance, moves * 4 Psych: A and O *3, insight good,  mood-full  DIAGNOSTIC DATA REVIEWED:  BMET    Component Value Date/Time   NA 139 09/28/2021 1100   K 4.4 09/28/2021 1100   CL 100 09/28/2021 1100   CO2 22 09/28/2021 1100   GLUCOSE 79 09/28/2021 1100   GLUCOSE 107 (H) 10/13/2020 0911   BUN 20 09/28/2021 1100   CREATININE 0.90 09/28/2021 1100   CREATININE 0.97 06/19/2018 1109   CALCIUM 9.6 09/28/2021 1100   GFRNONAA >60 10/13/2020 0911   GFRNONAA 86 06/19/2018 1109   GFRAA 104 02/03/2020 1334   GFRAA 100 06/19/2018 1109   Lab Results  Component Value Date   HGBA1C 5.1 09/28/2021   HGBA1C  12/14/2009    5.5 (NOTE)                                                                       According to the [employer]  Recommendations for 2011, when HbA1c is used as a screening test:   >=6.5%   Diagnostic of Diabetes Mellitus           (if abnormal result  is confirmed)  5.7-6.4%   Increased risk of developing Diabetes Mellitus  References:Diagnosis and Classification of Diabetes Mellitus,Diabetes GYIR,4854,62(VOJJK 1):S62-S69 and Standards of Medical Care in         Diabetes - 2011,Diabetes KXFG,1829,93  (Suppl 1):S11-S61.   Lab Results  Component Value Date   INSULIN 5.8 09/28/2021   INSULIN 17.2 02/03/2020   Lab Results  Component Value Date   TSH 2.440 02/21/2021   CBC    Component Value Date/Time   WBC 5.8 02/21/2021 1014   WBC 8.9 10/13/2020 0911   RBC 4.94 02/21/2021 1014   RBC 5.04 10/13/2020 0911   HGB 16.4 02/21/2021 1014   HCT 46.4 02/21/2021 1014   PLT 192 02/21/2021 1014   MCV 94 02/21/2021 1014   MCH 33.2 (H) 02/21/2021 1014   MCH 32.1 10/13/2020 0911   MCHC 35.3 02/21/2021 1014   MCHC 33.6 10/13/2020 0911   RDW 13.1 02/21/2021 1014   Iron Studies No results found for: "IRON", "TIBC", "FERRITIN", "IRONPCTSAT" Lipid Panel     Component Value Date/Time   CHOL 148 09/28/2021 1100   TRIG 61 09/28/2021 1100   HDL 80 09/28/2021 1100   CHOLHDL 1.9 09/28/2021 1100   CHOLHDL 2.8 11/20/2018 1147    VLDL 15 05/23/2012 0610   LDLCALC 55 09/28/2021 1100   LDLCALC 76 11/20/2018 1147   Hepatic Function Panel     Component Value Date/Time   PROT 6.6 09/28/2021 1100   ALBUMIN 4.5 09/28/2021 1100   AST 36 09/28/2021 1100   AST 22 02/12/2018 1258   ALT 28 09/28/2021 1100   ALT 19 02/12/2018 1258   ALKPHOS 82 09/28/2021 1100   BILITOT 0.5 09/28/2021 1100   BILITOT 0.5 02/12/2018 1258   BILIDIR 0.1 11/20/2018 1151   IBILI 0.3 11/20/2018 1151      Component Value Date/Time   TSH 2.440 02/21/2021 1014   Nutritional Lab Results  Component Value Date   VD25OH 50.4 09/28/2021   VD25OH 46.4 01/10/2021   VD25OH 60.9 08/30/2020    Attestations:   Reviewed by clinician on day of visit: allergies, medications, problem list, medical history, surgical history, family history, social history, and previous encounter notes.   I,Safa M Kadhim,acting as a scribe for [employer], DO.,have documented all relevant documentation on the behalf of Mellody Dance, DO,as directed by  Mellody Dance, DO while in the presence of Mellody Dance, DO.   I, Mellody Dance, DO, have reviewed all documentation for this visit. The documentation on 03/20/22 for the exam, diagnosis, procedures, and orders are all accurate and complete.

## 2022-03-15 DIAGNOSIS — M13831 Other specified arthritis, right wrist: Secondary | ICD-10-CM | POA: Diagnosis not present

## 2022-03-15 DIAGNOSIS — M13832 Other specified arthritis, left wrist: Secondary | ICD-10-CM | POA: Diagnosis not present

## 2022-03-27 ENCOUNTER — Other Ambulatory Visit (HOSPITAL_BASED_OUTPATIENT_CLINIC_OR_DEPARTMENT_OTHER): Payer: Self-pay

## 2022-03-27 MED ORDER — AMOXICILLIN 500 MG PO CAPS
2000.0000 mg | ORAL_CAPSULE | Freq: Once | ORAL | 0 refills | Status: AC
  Filled 2022-03-27: qty 8, 2d supply, fill #0

## 2022-03-30 ENCOUNTER — Other Ambulatory Visit (HOSPITAL_BASED_OUTPATIENT_CLINIC_OR_DEPARTMENT_OTHER): Payer: Self-pay

## 2022-04-02 LAB — CUP PACEART REMOTE DEVICE CHECK
Date Time Interrogation Session: 20240329230648
Implantable Pulse Generator Implant Date: 20220727

## 2022-04-03 ENCOUNTER — Ambulatory Visit (INDEPENDENT_AMBULATORY_CARE_PROVIDER_SITE_OTHER): Payer: Commercial Managed Care - PPO

## 2022-04-03 ENCOUNTER — Other Ambulatory Visit (HOSPITAL_BASED_OUTPATIENT_CLINIC_OR_DEPARTMENT_OTHER): Payer: Self-pay

## 2022-04-03 DIAGNOSIS — I480 Paroxysmal atrial fibrillation: Secondary | ICD-10-CM | POA: Diagnosis not present

## 2022-04-05 NOTE — Progress Notes (Signed)
[facility] Summary Report / Loop Recorder 

## 2022-04-07 ENCOUNTER — Other Ambulatory Visit (HOSPITAL_BASED_OUTPATIENT_CLINIC_OR_DEPARTMENT_OTHER): Payer: Self-pay

## 2022-04-10 ENCOUNTER — Other Ambulatory Visit (HOSPITAL_BASED_OUTPATIENT_CLINIC_OR_DEPARTMENT_OTHER): Payer: Self-pay

## 2022-04-10 ENCOUNTER — Ambulatory Visit: Payer: 59

## 2022-04-17 ENCOUNTER — Other Ambulatory Visit (HOSPITAL_BASED_OUTPATIENT_CLINIC_OR_DEPARTMENT_OTHER): Payer: Self-pay

## 2022-04-18 ENCOUNTER — Ambulatory Visit (INDEPENDENT_AMBULATORY_CARE_PROVIDER_SITE_OTHER): Payer: Commercial Managed Care - PPO | Admitting: Family Medicine

## 2022-04-18 ENCOUNTER — Other Ambulatory Visit (HOSPITAL_BASED_OUTPATIENT_CLINIC_OR_DEPARTMENT_OTHER): Payer: Self-pay

## 2022-04-18 ENCOUNTER — Encounter (INDEPENDENT_AMBULATORY_CARE_PROVIDER_SITE_OTHER): Payer: Self-pay | Admitting: Family Medicine

## 2022-04-18 VITALS — BP 126/85 | HR 70 | Temp 98.2°F | Ht 71.0 in | Wt 198.4 lb

## 2022-04-18 DIAGNOSIS — E559 Vitamin D deficiency, unspecified: Secondary | ICD-10-CM | POA: Diagnosis not present

## 2022-04-18 DIAGNOSIS — E538 Deficiency of other specified B group vitamins: Secondary | ICD-10-CM

## 2022-04-18 DIAGNOSIS — Z6827 Body mass index (BMI) 27.0-27.9, adult: Secondary | ICD-10-CM

## 2022-04-18 DIAGNOSIS — I10 Essential (primary) hypertension: Secondary | ICD-10-CM

## 2022-04-18 DIAGNOSIS — R7303 Prediabetes: Secondary | ICD-10-CM | POA: Diagnosis not present

## 2022-04-18 DIAGNOSIS — Z9189 Other specified personal risk factors, not elsewhere classified: Secondary | ICD-10-CM | POA: Diagnosis not present

## 2022-04-18 DIAGNOSIS — Z6828 Body mass index (BMI) 28.0-28.9, adult: Secondary | ICD-10-CM

## 2022-04-18 DIAGNOSIS — E782 Mixed hyperlipidemia: Secondary | ICD-10-CM | POA: Diagnosis not present

## 2022-04-18 MED ORDER — SEMAGLUTIDE-WEIGHT MANAGEMENT 2.4 MG/0.75ML ~~LOC~~ SOAJ
2.4000 mg | SUBCUTANEOUS | 0 refills | Status: DC
  Filled 2022-04-18: qty 3, 28d supply, fill #0

## 2022-04-18 MED ORDER — VITAMIN D (CHOLECALCIFEROL) 25 MCG (1000 UT) PO TABS
ORAL_TABLET | ORAL | Status: DC
Start: 2022-04-18 — End: 2022-04-24

## 2022-04-18 NOTE — Progress Notes (Signed)
[name], D.O.  ABFM, ABOM Specializing in Clinical Bariatric Medicine  Office located at: [address] [place], [place]  [address]     Assessment and Plan:   Orders Placed This Encounter  Procedures   Comprehensive metabolic panel   Hemoglobin A1c   Insulin, random   VITAMIN D 25 Hydroxy (Vit-D Deficiency, Fractures)   Vitamin B12   Lipid panel    Medications Discontinued During This Encounter  Medication Reason   Semaglutide-Weight Management 2.4 MG/0.75ML SOAJ Reorder   Vitamin D, Cholecalciferol, 25 MCG (1000 UT) TABS Reorder     Meds ordered this encounter  Medications   Semaglutide-Weight Management 2.4 MG/0.75ML SOAJ    Sig: Inject 2.4 mg into the skin once a week on Friday    Dispense:  3 mL    Refill:  0   Vitamin D, Cholecalciferol, 25 MCG (1000 UT) TABS    Sig: 1000 IU qd    Dispense:  60 tablet     Essential hypertension Assessment: Condition is stable.  Last 3 blood pressure readings in our office are as follows: BP Readings from Last 3 Encounters:  04/18/22 126/85  03/14/22 127/86  02/01/22 (!) 132/91  Patient is compliant with Zestoretic 20-12.5 mg daily, Cartia XT 120 mg daily, and Toprol-XL 50 mg daily. Denies any side effects. No concerns. Asymptomatic.   Plan: Recheck labs.  Continue with Prudent nutritional plan and low sodium diet, advance exercise as tolerated.     Pre-diabetes Assessment: Condition is stable.  Lab Results  Component Value Date   HGBA1C 5.1 09/28/2021   HGBA1C 5.4 01/10/2021   HGBA1C 5.5 09/16/2020   INSULIN 5.8 09/28/2021   INSULIN 10.3 02/21/2021   INSULIN 17.2 02/03/2020  Patient is currently not on any medication for Prediabetes. This is diet controlled. His hunger and cravings are controlled when eating on plan.   Plan: Recheck labs.    Continue his prudent nutritional plan that is low in simple carbohydrates, saturated fats and trans fats to goal of 5-10% weight loss to achieve significant  health benefits.  Pt encouraged to continually advance exercise and cardiovascular fitness as tolerated throughout weight loss journey.    At high risk for cardiovascular disease Assessment: Condition is stable.  Patient has a history of DVT, High Cholesterol, Prediabetes. His is on chronic anticoagulation medication.   Plan: recheck labs. Continue with med  Continue his prudent nutritional plan that is low in simple carbohydrates, saturated fats and trans fats to goal of 5-10% weight loss to achieve significant health benefits.  Pt encouraged to continually advance exercise and cardiovascular fitness as tolerated throughout weight loss journey.      Vitamin D deficiency Assessment: Condition is stable. Lab Results  Component Value Date   VD25OH 50.4 09/28/2021   VD25OH 46.4 01/10/2021   VD25OH 60.9 08/30/2020  Patient is taking OTC Vitamin D 1,100 IU daily. Denies any side effects.  Plan: Recheck labs. Continue with OTC med.  - weight loss will likely improve availability of vitamin D, thus encouraged Brynn to continue with meal plan and their weight loss efforts to further improve this condition.  Thus, we will need to monitor levels regularly (every 3-4 mo on average) to keep levels within normal limits and prevent over supplementation.    B12 deficiency Assessment: Condition is stable.  Lab Results  Component Value Date   VITAMINB12 591 09/28/2021  He reports good compliance and tolerance with OTC B12 2500 mcg weekly. Denies any  side effects.   Plan: recheck labs. Continue with med  Continue his prudent nutritional plan that is low in simple carbohydrates, saturated fats and trans fats to goal of 5-10% weight loss to achieve significant health benefits.  Pt encouraged to continually advance exercise and cardiovascular fitness as tolerated throughout weight loss journey.   Mixed hyperlipidemia Assessment: Condition is stable. Lab Results  Component Value Date   CHOL 148  09/28/2021   HDL 80 09/28/2021   LDLCALC 55 09/28/2021   TRIG 61 09/28/2021   CHOLHDL 1.9 09/28/2021  No issues with Lipitor 80 mg daily, compliance good.  Plan: Recheck labs. Continue with med as recommended by specialist.  Continue prudent nutritional plan and increase exercise as tolerated in the future.   TREATMENT PLAN FOR OBESITY: BMI 28.0-28.9,adult-current bmi 27.7 Morbid obesity (HCC)-starting bmi 48.89/DATE 02/03/20 Assessment: Condition is improving, but not optimized. Biometric data collected today, was reviewed with patient.  Fat mass has decreased by 3.6lb. Muscle mass has decreased by 2lb. Total body water has decreased by 5.2lb. Patient is compliant with Semaglutide 2.4 mg weekly. Denies any side effects.His hunger and cravings are controlled.   Plan: Continue with med. Jered is currently in the action stage of change. As such, his goal is to continue weight management plan. Jermar will work on healthier eating habits and try their best to continue the Category 4 meal plan.   Behavioral Intervention Additional resources provided today: patient declined Evidence-based interventions for health behavior change were utilized today including the discussion of self monitoring techniques, problem-solving barriers and SMART goal setting techniques.   Regarding patient's less desirable eating habits and patterns, we employed the technique of small changes.  Pt will specifically work on: add weight lifting 2-3 days a week to his exercise regiment for next visit.    Recommended Physical Activity Goals Sevon has been advised to work up to 150 minutes of moderate intensity aerobic activity a week and strengthening exercises 2-3 times per week for cardiovascular health, weight loss maintenance and preservation of muscle mass.  He has agreed to [employer] strengthening exercises with a goal of 2-3 sessions a week   FOLLOW UP: No follow-ups on file. He was informed of the importance of  frequent follow up visits to maximize his success with intensive lifestyle modifications for his multiple health conditions.  CAIDIN HEIDENREICH is aware that we will review all of his lab results at our next visit.  He is aware that if anything is critical/ life threatening with the results, we will be contacting him via MyChart prior to the office visit to discuss management.   Subjective:   Chief complaint: Obesity Chue is here to discuss his progress with his obesity treatment plan. He is on the the Category 4 Plan and states he is following his eating plan approximately 85% of the time. He states he is walking 1 mile 4 days per week.  Interval History:  ANUEL SITTER is here for a follow up office visit. Since last office visit he has been mostly following his meal plan. He endorses that his clothes are feeling looser. He endorses drinking about 300 calories of beer daily. Patient has a history of A-Fib; He is on Eliquid 5 mg BID, denies any side effects.  When eating on plan, his hunger and cravings are well controlled.     Pharmacotherapy for weight loss: He is currently taking  Semaglutide  for medical weight loss.  Denies side effects.    Review  of Systems:  Pertinent positives were addressed with patient today.   Weight Summary and Biometrics   Weight Lost Since Last Visit: 6lb  Weight Gained Since Last Visit: 0    Vitals Temp: 98.2 F (36.8 C) BP: 126/85 Pulse Rate: 70 SpO2: 99 %   Anthropometric Measurements Height:  (1.803 m) Weight: 198 lb 6.4 oz (90 kg) BMI (Calculated): 27.68 Weight at Last Visit: 207lb Weight Lost Since Last Visit: 6lb Weight Gained Since Last Visit: 0 Starting Weight: 350lb Total Weight Loss (lbs): 152 lb (68.9 kg) Peak Weight: 350lb   Body Composition  Body Fat %: 33.9 % Fat Mass (lbs): 67.2 lbs Muscle Mass (lbs): 124.6 lbs Total Body Water (lbs): 95.2 lbs Visceral Fat Rating : 16   Other Clinical Data Fasting: YES Labs:  YES Today's Visit #: 32 Starting Date: 02/03/20     Objective:   PHYSICAL EXAM: Blood pressure 126/85, pulse 70, temperature 98.2 F (36.8 C), height  (1.803 m), weight 198 lb 6.4 oz (90 kg), SpO2 99 %. Body mass index is 27.67 kg/m.  General: Well Developed, well nourished, and in no acute distress.  HEENT: Normocephalic, atraumatic Skin: Warm and dry, cap RF less 2 sec, good turgor Chest:  Normal excursion, shape, no gross abn Respiratory: speaking in full sentences, no conversational dyspnea NeuroM-Sk: Ambulates w/o assistance, moves * 4 Psych: A and O *3, insight good, mood-full  DIAGNOSTIC DATA REVIEWED:  BMET    Component Value Date/Time   NA 139 09/28/2021 1100   K 4.4 09/28/2021 1100   CL 100 09/28/2021 1100   CO2 22 09/28/2021 1100   GLUCOSE 79 09/28/2021 1100   GLUCOSE 107 (H) 10/13/2020 0911   BUN 20 09/28/2021 1100   CREATININE 0.90 09/28/2021 1100   CREATININE 0.97 06/19/2018 1109   CALCIUM 9.6 09/28/2021 1100   GFRNONAA >60 10/13/2020 0911   GFRNONAA 86 06/19/2018 1109   GFRAA 104 02/03/2020 1334   GFRAA 100 06/19/2018 1109   Lab Results  Component Value Date   HGBA1C 5.1 09/28/2021   HGBA1C  12/14/2009    5.5 (NOTE)                                                                       According to the ADA Clinical Practice Recommendations for 2011, when HbA1c is used as a screening test:   >=6.5%   Diagnostic of Diabetes Mellitus           (if abnormal result  is confirmed)  5.7-6.4%   Increased risk of developing Diabetes Mellitus  References:Diagnosis and Classification of Diabetes Mellitus,Diabetes Care,2011,34(Suppl 1):S62-S69 and Standards of Medical Care in         Diabetes - 2011,Diabetes Care,2011,34  (Suppl 1):S11-S61.   Lab Results  Component Value Date   INSULIN 5.8 09/28/2021   INSULIN 17.2 02/03/2020   Lab Results  Component Value Date   TSH 2.440 02/21/2021   CBC    Component Value Date/Time   WBC 5.8 02/21/2021 1014    WBC 8.9 10/13/2020 0911   RBC 4.94 02/21/2021 1014   RBC 5.04 10/13/2020 0911   HGB 16.4 02/21/2021 1014   HCT 46.4 02/21/2021 1014   PLT 192 02/21/2021 1014  MCV 94 02/21/2021 1014   MCH 33.2 (H) 02/21/2021 1014   MCH 32.1 10/13/2020 0911   MCHC 35.3 02/21/2021 1014   MCHC 33.6 10/13/2020 0911   RDW 13.1 02/21/2021 1014   Iron Studies No results found for: "IRON", "TIBC", "FERRITIN", "IRONPCTSAT" Lipid Panel     Component Value Date/Time   CHOL 148 09/28/2021 1100   TRIG 61 09/28/2021 1100   HDL 80 09/28/2021 1100   CHOLHDL 1.9 09/28/2021 1100   CHOLHDL 2.8 11/20/2018 1147   VLDL 15 05/23/2012 0610   LDLCALC 55 09/28/2021 1100   LDLCALC 76 11/20/2018 1147   Hepatic Function Panel     Component Value Date/Time   PROT 6.6 09/28/2021 1100   ALBUMIN 4.5 09/28/2021 1100   AST 36 09/28/2021 1100   AST 22 02/12/2018 1258   ALT 28 09/28/2021 1100   ALT 19 02/12/2018 1258   ALKPHOS 82 09/28/2021 1100   BILITOT 0.5 09/28/2021 1100   BILITOT 0.5 02/12/2018 1258   BILIDIR 0.1 11/20/2018 1151   IBILI 0.3 11/20/2018 1151      Component Value Date/Time   TSH 2.440 02/21/2021 1014   Nutritional Lab Results  Component Value Date   VD25OH 50.4 09/28/2021   VD25OH 46.4 01/10/2021   VD25OH 60.9 08/30/2020    Attestations:   Reviewed by clinician on day of visit: allergies, medications, problem list, medical history, surgical history, family history, social history, and previous encounter notes.  I,Special Puri,acting as a Neurosurgeon for [name], DO.,have documented all relevant documentation on the behalf of Thomasene Lot, DO,as directed by  Thomasene Lot, DO while in the presence of Thomasene Lot, DO.   I, Thomasene Lot, DO, have reviewed all documentation for this visit. The documentation on 04/18/22 for the exam, diagnosis, procedures, and orders are all accurate and complete.

## 2022-04-19 LAB — LIPID PANEL
Chol/HDL Ratio: 1.8 ratio (ref 0.0–5.0)
Cholesterol, Total: 146 mg/dL (ref 100–199)
HDL: 80 mg/dL (ref >39)
LDL Chol Calc (NIH): 54 mg/dL (ref 0–99)
Triglycerides: 54 mg/dL (ref 0–149)
VLDL Cholesterol Cal: 12 mg/dL (ref 5–40)

## 2022-04-19 LAB — COMPREHENSIVE METABOLIC PANEL
ALT: 24 IU/L (ref 0–44)
AST: 33 IU/L (ref 0–40)
Albumin/Globulin Ratio: 2.1 (ref 1.2–2.2)
Albumin: 4.5 g/dL (ref 3.8–4.9)
Alkaline Phosphatase: 83 IU/L (ref 44–121)
BUN/Creatinine Ratio: 31 — ABNORMAL HIGH (ref 10–24)
BUN: 26 mg/dL (ref 8–27)
Bilirubin Total: 0.4 mg/dL (ref 0.0–1.2)
CO2: 22 mmol/L (ref 20–29)
Calcium: 9.6 mg/dL (ref 8.6–10.2)
Chloride: 100 mmol/L (ref 96–106)
Creatinine, Ser: 0.85 mg/dL (ref 0.76–1.27)
Globulin, Total: 2.1 g/dL (ref 1.5–4.5)
Glucose: 77 mg/dL (ref 70–99)
Potassium: 5 mmol/L (ref 3.5–5.2)
Sodium: 139 mmol/L (ref 134–144)
Total Protein: 6.6 g/dL (ref 6.0–8.5)
eGFR: 99 mL/min/{1.73_m2} (ref >59)

## 2022-04-19 LAB — HEMOGLOBIN A1C
Est. average glucose Bld gHb Est-mCnc: 108 mg/dL
Hgb A1c MFr Bld: 5.4 % (ref 4.8–5.6)

## 2022-04-19 LAB — VITAMIN D 25 HYDROXY (VIT D DEFICIENCY, FRACTURES): Vit D, 25-Hydroxy: 54.1 ng/mL (ref 30.0–100.0)

## 2022-04-19 LAB — INSULIN, RANDOM: INSULIN: 6.9 u[IU]/mL (ref 2.6–24.9)

## 2022-04-19 LAB — VITAMIN B12: Vitamin B-12: 666 pg/mL (ref 232–1245)

## 2022-04-24 ENCOUNTER — Other Ambulatory Visit: Payer: Self-pay

## 2022-04-24 MED ORDER — VITAMIN D (CHOLECALCIFEROL) 25 MCG (1000 UT) PO TABS
ORAL_TABLET | ORAL | Status: DC
Start: 2022-04-24 — End: 2022-05-31

## 2022-04-25 ENCOUNTER — Other Ambulatory Visit: Payer: Self-pay

## 2022-05-03 LAB — CUP PACEART REMOTE DEVICE CHECK
Date Time Interrogation Session: 20240429230733
Implantable Pulse Generator Implant Date: 20220727

## 2022-05-08 ENCOUNTER — Ambulatory Visit (INDEPENDENT_AMBULATORY_CARE_PROVIDER_SITE_OTHER): Payer: Commercial Managed Care - PPO

## 2022-05-08 DIAGNOSIS — I480 Paroxysmal atrial fibrillation: Secondary | ICD-10-CM | POA: Diagnosis not present

## 2022-05-11 ENCOUNTER — Other Ambulatory Visit: Payer: Self-pay

## 2022-05-11 ENCOUNTER — Other Ambulatory Visit (HOSPITAL_BASED_OUTPATIENT_CLINIC_OR_DEPARTMENT_OTHER): Payer: Self-pay

## 2022-05-11 ENCOUNTER — Other Ambulatory Visit (HOSPITAL_COMMUNITY): Payer: Self-pay

## 2022-05-11 MED ORDER — DILTIAZEM HCL ER COATED BEADS 120 MG PO CP24
120.0000 mg | ORAL_CAPSULE | Freq: Every day | ORAL | 2 refills | Status: DC
  Filled 2022-05-11: qty 90, 90d supply, fill #0
  Filled 2022-08-12: qty 90, 90d supply, fill #1
  Filled 2022-11-12: qty 90, 90d supply, fill #2

## 2022-05-11 NOTE — Progress Notes (Signed)
[facility] Summary Report / Loop Recorder 

## 2022-05-12 ENCOUNTER — Other Ambulatory Visit (HOSPITAL_BASED_OUTPATIENT_CLINIC_OR_DEPARTMENT_OTHER): Payer: Self-pay

## 2022-05-15 ENCOUNTER — Ambulatory Visit: Payer: 59

## 2022-05-15 ENCOUNTER — Other Ambulatory Visit (HOSPITAL_BASED_OUTPATIENT_CLINIC_OR_DEPARTMENT_OTHER): Payer: Self-pay

## 2022-05-25 ENCOUNTER — Other Ambulatory Visit: Payer: Self-pay | Admitting: Interventional Cardiology

## 2022-05-25 ENCOUNTER — Other Ambulatory Visit (HOSPITAL_BASED_OUTPATIENT_CLINIC_OR_DEPARTMENT_OTHER): Payer: Self-pay

## 2022-05-25 ENCOUNTER — Other Ambulatory Visit: Payer: Self-pay

## 2022-05-25 MED ORDER — ATORVASTATIN CALCIUM 80 MG PO TABS
80.0000 mg | ORAL_TABLET | Freq: Every day | ORAL | 3 refills | Status: DC
  Filled 2022-05-25: qty 90, 90d supply, fill #0
  Filled 2022-08-19: qty 90, 90d supply, fill #1
  Filled 2022-12-03: qty 90, 90d supply, fill #2
  Filled 2023-03-04: qty 90, 90d supply, fill #3

## 2022-05-31 ENCOUNTER — Ambulatory Visit (INDEPENDENT_AMBULATORY_CARE_PROVIDER_SITE_OTHER): Payer: Commercial Managed Care - PPO | Admitting: Family Medicine

## 2022-05-31 ENCOUNTER — Encounter (INDEPENDENT_AMBULATORY_CARE_PROVIDER_SITE_OTHER): Payer: Self-pay | Admitting: Family Medicine

## 2022-05-31 VITALS — BP 104/66 | HR 64 | Temp 98.0°F | Ht 71.0 in | Wt 200.0 lb

## 2022-05-31 DIAGNOSIS — R7303 Prediabetes: Secondary | ICD-10-CM

## 2022-05-31 DIAGNOSIS — Z6827 Body mass index (BMI) 27.0-27.9, adult: Secondary | ICD-10-CM

## 2022-05-31 DIAGNOSIS — I10 Essential (primary) hypertension: Secondary | ICD-10-CM

## 2022-05-31 DIAGNOSIS — E559 Vitamin D deficiency, unspecified: Secondary | ICD-10-CM | POA: Diagnosis not present

## 2022-05-31 DIAGNOSIS — E538 Deficiency of other specified B group vitamins: Secondary | ICD-10-CM

## 2022-05-31 DIAGNOSIS — E7849 Other hyperlipidemia: Secondary | ICD-10-CM | POA: Diagnosis not present

## 2022-05-31 DIAGNOSIS — Z6828 Body mass index (BMI) 28.0-28.9, adult: Secondary | ICD-10-CM

## 2022-05-31 MED ORDER — B-12 500 MCG PO TABS
ORAL_TABLET | ORAL | 0 refills | Status: DC
Start: 2022-05-31 — End: 2023-04-12

## 2022-05-31 MED ORDER — VITAMIN D (CHOLECALCIFEROL) 25 MCG (1000 UT) PO TABS
ORAL_TABLET | ORAL | 0 refills | Status: DC
Start: 2022-05-31 — End: 2023-04-12

## 2022-05-31 NOTE — Progress Notes (Signed)
[name], D.O.  ABFM, ABOM Specializing in Clinical Bariatric Medicine  Office located at: [address] [place], [place]  [address]     Assessment and Plan:   Medications Discontinued During This Encounter  Medication Reason   Vitamin D, Cholecalciferol, 25 MCG (1000 UT) TABS Patient Preference   Semaglutide-Weight Management 2.4 MG/0.75ML SOAJ Patient Preference   Cyanocobalamin (B-12) 500 MCG TABS Reorder     Meds ordered this encounter  Medications   Vitamin D, Cholecalciferol, 25 MCG (1000 UT) TABS    Sig: 1000 IU qd    Dispense:  60 tablet    Refill:  0   Cyanocobalamin (B-12) 500 MCG TABS    Sig: once wkly of B12    Dispense:  90 tablet    Refill:  0     Other hyperlipidemia Assessment: Condition is stable. Labs were reviewed with pt today and education provided on them and how the foods patient eats may influence these findings. All questions were answered about them.   Lab Results  Component Value Date   CHOL 146 04/18/2022   HDL 80 04/18/2022   LDLCALC 54 04/18/2022   TRIG 54 04/18/2022   CHOLHDL 1.8 04/18/2022  No issues with Lipitor 80 mg daily. Denies any adverse effects. Labs indicate that: - His HDL is at goal at 80.  - His triglycerides are at goal and have slightly improved from 61 on 09/28/21 to 54.  - His cholesterol levels have also improved slightly from 148 on 09/28/21 to 146.   Plan:  - Travis Huff agrees to continue with med as recommended by specialist and our treatment plan of a heart-heathy, low cholesterol meal plan - I again discussed the importance that patient continue with our prudent nutritional plan that is low in saturated and trans fats, and low in fatty carbs to improve these numbers.  - We again recommend: aerobic activity with eventual goal of a minimum of 150+ min wk plus 2 days/ week of resistance or strength training.        Pre-diabetes Assessment: Condition is stable. Labs were reviewed with  patient today and education provided on them.  All of the patient's questions about them were answered   Lab Results  Component Value Date   HGBA1C 5.4 04/18/2022   HGBA1C 5.1 09/28/2021   HGBA1C 5.4 01/10/2021   INSULIN 6.9 04/18/2022   INSULIN 5.8 09/28/2021   INSULIN 10.3 02/21/2021   Lab Results  Component Value Date   CREATININE 0.85 04/18/2022   BUN 26 04/18/2022   NA 139 04/18/2022   K 5.0 04/18/2022   CL 100 04/18/2022   CO2 22 04/18/2022      Component Value Date/Time   PROT 6.6 04/18/2022 1057   ALBUMIN 4.5 04/18/2022 1057   AST 33 04/18/2022 1057   AST 22 02/12/2018 1258   ALT 24 04/18/2022 1057   ALT 19 02/12/2018 1258   ALKPHOS 83 04/18/2022 1057   BILITOT 0.4 04/18/2022 1057   BILITOT 0.5 02/12/2018 1258   BILIDIR 0.1 11/20/2018 1151   IBILI 0.3 11/20/2018 1151  - Labs indicate that: - His A1c levels are stable. - His Insulin levels have slightly increased from 5.8 to 6.9. No concerns.  - Sodium levels indicate that he is likely dehydrated.  - Electrolytes are grossly within normal limits.  - Overall, labs indicate that kidney and liver function are stable.  - He has been off Wegovy for the past few  weeks due to insurance no longer covering medication. He notices having occasional hunger/cravings.   Plan:  -Unless pre-existing renal or cardiopulmonary conditions exist which patient was told to limit their fluid intake by another provider, I recommended roughly one half of their weight in pounds, to be the approximate ounces of non-caloric, non-caffeinated beverages they should drink per day; including more if they are engaging in exercise.  - In addition, we discussed Metformin  which can help [place] in the management of this disease process as well as with weight loss.  Will consider starting Metformin in future as we will focus on prudent nutritional plan at this time.  Travis Huff will continue to work on weight loss, exercise, via their meal plan we devised to  help decrease the risk of progressing to diabetes.      Vitamin D deficiency Assessment: Condition is at goal. Labs were reviewed with pt today and education provided on them. All questions were answered about them.   Lab Results  Component Value Date   VD25OH 54.1 04/18/2022   VD25OH 50.4 09/28/2021   VD25OH 46.4 01/10/2021  - Labs indicate that his Vitamin D levels are within the recommended normal limits of 50-80.  - No issues with OTC Vitamin D 1,000 IU daily. Denies any side effects.  Plan: - Continue with OTC med.  - I again reviewed possible symptoms of low Vitamin D:  low energy, depressed mood, muscle aches, joint aches, osteoporosis etc. with patient - It has been show that administration of vitamin D supplementation leads to improved satiety and a decrease in inflammatory markers.  Hence, low Vitamin D levels may be linked to an increased risk of cardiovascular events and even increased risk of cancers- such as colon and breast. - Continue to monitor levels regularly (every 3-4 mo on average) to keep levels within normal limits and prevent over supplementation.    B12 deficiency Assessment: Condition is stable. Labs were reviewed with pt today and education provided on them. All questions were answered about them.   Lab Results  Component Value Date   VITAMINB12 666 04/18/2022  - Labs indicate that his B12 levels are within the recommended normal limits of 500+.  - Has been compliant and tolerant with OTC B12 2500 mcg once weekly. Denies any adverse effects.  Plan: - Continue with OTC med.   - Continue their prudent nutritional plan and focus on b12 rich foods such as lean red meats; poultry; eggs; seafood; beans, peas, and lentils; nuts and seeds; and soy products - We will continue to monitor as deemed clinically necessary.    Essential hypertension Assessment: Condition is low today, likely due to his increased exercise and weight loss.  Last 3 blood pressure  readings in our office are as follows: BP Readings from Last 3 Encounters:  05/31/22 104/66  04/18/22 126/85  03/14/22 127/86  - Patient endorses that he is asymptomatic today, no concerns.  - Patient reports feeling light headed in the morning on one occasion since last OV.  - Pt is compliant and tolerance with Zestoretic 20-12.5 mg daily, Cartia XT 120 mg daily, and Toprol-XL 50 mg daily. Denies any side effects   Plan: - Continue with antihypertensive medications as recommended by cardiologist.  - Advised pt to check blood pressure and heart rate twice a week. If his blood pressure runs low at home, I highly recommended him to contact his cardiology team about a possible change in his blood pressure medication.  - We  will continue to monitor closely alongside PCP/ specialists.  - We will continue to monitor symptoms as they relate to the his weight loss journey.    TREATMENT PLAN FOR OBESITY: BMI 28.0-28.9,adult-current bmi 27.91 Morbid obesity (HCC)-starting bmi 48.89/DATE 02/03/20 Assessment:  Travis Huff is here to discuss his progress with his obesity treatment plan along with follow-up of his obesity related diagnoses. See Medical Weight Management Flowsheet for complete bioelectrical impedance results.  Condition is not optimized. Biometric data collected today, was reviewed with patient.   Since last office visit on 04/18/22 patient's Fat mass has increased by 2.2 lb. Muscle mass has increased by 0.2 lb. Total body water has increased by 3 lb.  Counseling done on how various foods will affect these numbers and how to maximize success  Total lbs lost to date: 150 Total weight loss percentage to date: 42.86   Plan:  - Continue the Category 4 meal plan.   Behavioral Intervention Additional resources provided today:  Metformin Handout Evidence-based interventions for health behavior change were utilized today including the discussion of self monitoring techniques,  problem-solving barriers and SMART goal setting techniques.   Regarding patient's less desirable eating habits and patterns, we employed the technique of small changes.  Pt will specifically work on: check blood pressure and heart rate twice a week, continue adherence to prudent nutritional plan for next visit.    Recommended Physical Activity Goals  Travis Huff has been advised to slowly work up to 150 minutes of moderate intensity aerobic activity a week and strengthening exercises 2-3 times per week for cardiovascular health, weight loss maintenance and preservation of muscle mass.   He has agreed to Continue current level of physical activity    FOLLOW UP: Return in about 6 weeks (around 07/12/2022). He was informed of the importance of frequent follow up visits to maximize his success with intensive lifestyle modifications for his multiple health conditions.   Subjective:   Chief complaint: Obesity Travis Huff is here to discuss his progress with his obesity treatment plan. He is on the the Category 4 Plan and states he is following his eating plan approximately 85% of the time. He states he is walking 45 minutes 3-5 days per week.  Interval History:  Travis Huff is here for a follow up office visit.     Since last office visit:   - Recently returned from an [place] trip.  - Endorses not eating fried foods on the trip, but did increase his alcohol intake.  - Endorses walking around 4.5-5 miles per week.  - He has been off Wegovy for the past few weeks due to insurance no longer covering medication and notices having occasional hunger/cravings.  - Pt endorses that he wakes up every 2 hours at night to urinate. Encouraged pt to contact a urologist for evaluation.   Review of Systems:  Pertinent positives were addressed with patient today.  Weight Summary and Biometrics   Weight Lost Since Last Visit: 0  Weight Gained Since Last Visit: 2 lb    Vitals Temp: 98 F (36.7 C) BP:  104/66 Pulse Rate: 64 SpO2: 98 %   Anthropometric Measurements Height: 5\' 11"  (1.803 m) Weight: 200 lb (90.7 kg) BMI (Calculated): 27.91 Weight at Last Visit: 198 lb Weight Lost Since Last Visit: 0 Weight Gained Since Last Visit: 2 lb Starting Weight: 350 lb Total Weight Loss (lbs): 150 lb (68 kg) Peak Weight: 350 lb   Body Composition  Body Fat %: 34.6 % Fat  Mass (lbs): 69.4 lbs Muscle Mass (lbs): 124.8 lbs Total Body Water (lbs): 98.2 lbs Visceral Fat Rating : 17   Other Clinical Data Fasting: No Labs: No Today's Visit #: 33 Starting Date: 02/03/20   Objective:   PHYSICAL EXAM: Blood pressure 104/66, pulse 64, temperature 98 F (36.7 C), height 5\' 11"  (1.803 m), weight 200 lb (90.7 kg), SpO2 98 %. Body mass index is 27.89 kg/m.  General: Well Developed, well nourished, and in no acute distress.  HEENT: Normocephalic, atraumatic Skin: Warm and dry, cap RF less 2 sec, good turgor Chest:  Normal excursion, shape, no gross abn Respiratory: speaking in full sentences, no conversational dyspnea NeuroM-Sk: Ambulates w/o assistance, moves * 4 Psych: A and O *3, insight good, mood-full  DIAGNOSTIC DATA REVIEWED:  BMET    Component Value Date/Time   NA 139 04/18/2022 1057   K 5.0 04/18/2022 1057   CL 100 04/18/2022 1057   CO2 22 04/18/2022 1057   GLUCOSE 77 04/18/2022 1057   GLUCOSE 107 (H) 10/13/2020 0911   BUN 26 04/18/2022 1057   CREATININE 0.85 04/18/2022 1057   CREATININE 0.97 06/19/2018 1109   CALCIUM 9.6 04/18/2022 1057   GFRNONAA >60 10/13/2020 0911   GFRNONAA 86 06/19/2018 1109   GFRAA 104 02/03/2020 1334   GFRAA 100 06/19/2018 1109   Lab Results  Component Value Date   HGBA1C 5.4 04/18/2022   HGBA1C  12/14/2009    5.5 (NOTE)                                                                       According to the ADA Clinical Practice Recommendations for 2011, when HbA1c is used as a screening test:   >=6.5%   Diagnostic of Diabetes Mellitus            (if abnormal result  is confirmed)  5.7-6.4%   Increased risk of developing Diabetes Mellitus  References:Diagnosis and Classification of Diabetes Mellitus,Diabetes Care,2011,34(Suppl 1):S62-S69 and Standards of Medical Care in         Diabetes - 2011,Diabetes Care,2011,34  (Suppl 1):S11-S61.   Lab Results  Component Value Date   INSULIN 6.9 04/18/2022   INSULIN 17.2 02/03/2020   Lab Results  Component Value Date   TSH 2.440 02/21/2021   CBC    Component Value Date/Time   WBC 5.8 02/21/2021 1014   WBC 8.9 10/13/2020 0911   RBC 4.94 02/21/2021 1014   RBC 5.04 10/13/2020 0911   HGB 16.4 02/21/2021 1014   HCT 46.4 02/21/2021 1014   PLT 192 02/21/2021 1014   MCV 94 02/21/2021 1014   MCH 33.2 (H) 02/21/2021 1014   MCH 32.1 10/13/2020 0911   MCHC 35.3 02/21/2021 1014   MCHC 33.6 10/13/2020 0911   RDW 13.1 02/21/2021 1014   Iron Studies No results found for: "IRON", "TIBC", "FERRITIN", "IRONPCTSAT" Lipid Panel     Component Value Date/Time   CHOL 146 04/18/2022 1057   TRIG 54 04/18/2022 1057   HDL 80 04/18/2022 1057   CHOLHDL 1.8 04/18/2022 1057   CHOLHDL 2.8 11/20/2018 1147   VLDL 15 05/23/2012 0610   LDLCALC 54 04/18/2022 1057   LDLCALC 76 11/20/2018 1147   Hepatic Function Panel     Component  Value Date/Time   PROT 6.6 04/18/2022 1057   ALBUMIN 4.5 04/18/2022 1057   AST 33 04/18/2022 1057   AST 22 02/12/2018 1258   ALT 24 04/18/2022 1057   ALT 19 02/12/2018 1258   ALKPHOS 83 04/18/2022 1057   BILITOT 0.4 04/18/2022 1057   BILITOT 0.5 02/12/2018 1258   BILIDIR 0.1 11/20/2018 1151   IBILI 0.3 11/20/2018 1151      Component Value Date/Time   TSH 2.440 02/21/2021 1014   Nutritional Lab Results  Component Value Date   VD25OH 54.1 04/18/2022   VD25OH 50.4 09/28/2021   VD25OH 46.4 01/10/2021    Attestations:   Reviewed by clinician on day of visit: allergies, medications, problem list, medical history, surgical history, family history, social  history, and previous encounter notes.   Patient was in the office today and time spent on visit including pre-visit chart review and post-visit care/coordination of care and electronic medical record documentation was 45 minutes. 50% of the time was in face to face counseling of this patient's medical condition(s) and providing education on treatment options to include the first-line treatment of diet and lifestyle modification.    I,Special Puri,acting as a Neurosurgeon for [name], DO.,have documented all relevant documentation on the behalf of Thomasene Lot, DO,as directed by  Thomasene Lot, DO while in the presence of Thomasene Lot, DO.   I, Thomasene Lot, DO, have reviewed all documentation for this visit. The documentation on 05/31/22 for the exam, diagnosis, procedures, and orders are all accurate and complete.

## 2022-06-01 ENCOUNTER — Ambulatory Visit (INDEPENDENT_AMBULATORY_CARE_PROVIDER_SITE_OTHER): Payer: Commercial Managed Care - PPO

## 2022-06-01 DIAGNOSIS — I480 Paroxysmal atrial fibrillation: Secondary | ICD-10-CM

## 2022-06-02 LAB — CUP PACEART REMOTE DEVICE CHECK
Date Time Interrogation Session: 20240530230500
Implantable Pulse Generator Implant Date: 20220727

## 2022-06-06 NOTE — Progress Notes (Signed)
[facility] Summary Report / Loop Recorder 

## 2022-06-07 ENCOUNTER — Encounter: Payer: Self-pay | Admitting: Sports Medicine

## 2022-06-07 ENCOUNTER — Ambulatory Visit (INDEPENDENT_AMBULATORY_CARE_PROVIDER_SITE_OTHER): Payer: Commercial Managed Care - PPO | Admitting: Sports Medicine

## 2022-06-07 VITALS — BP 125/78 | HR 79 | Ht 71.0 in | Wt 207.0 lb

## 2022-06-07 DIAGNOSIS — Z0000 Encounter for general adult medical examination without abnormal findings: Secondary | ICD-10-CM | POA: Diagnosis not present

## 2022-06-07 DIAGNOSIS — M48062 Spinal stenosis, lumbar region with neurogenic claudication: Secondary | ICD-10-CM

## 2022-06-07 DIAGNOSIS — L989 Disorder of the skin and subcutaneous tissue, unspecified: Secondary | ICD-10-CM

## 2022-06-07 NOTE — Progress Notes (Signed)
Subjective:    CC: Annual Physical Exam  HPI:  This patient is here for their annual physical  I reviewed the past medical history, family history, social history, surgical history, and allergies today and no changes were needed.  Please see the problem list section below in epic for further details.  Past Medical History: Past Medical History:  Diagnosis Date   Arrhythmia    Arthritis    shoulders, knees,  back,hips   Atrial fibrillation (HCC)    Avascular necrosis of bone of left hip (HCC)    Back pain    Balance problem    Blood clot in vein 06/2016   x 2 no bleeding disorders found after back surgery   Complication of anesthesia    ketamine hallucinations   Constipation    Coronary artery disease    Dislocated hip, left, initial encounter (HCC) 10/03/2018   Dysrhythmia 2020   a-fib   Hx of blood clots    Hypertension    Implantable loop recorder present    Joint pain    Neuromuscular disorder (HCC)    neuropathy bi lat legs knee down   Neuropathy    PAF (paroxysmal atrial fibrillation) (HCC)    Pre-diabetes    Prediabetes    Shakes    SOBOE (shortness of breath on exertion)    Swelling of both lower extremities    Viral pericarditis 20 yrs ago   Past Surgical History: Past Surgical History:  Procedure Laterality Date   ABDOMINAL EXPOSURE N/A 06/23/2016   Procedure: ABDOMINAL EXPOSURE;  Surgeon: Larina Earthly, MD;  Location: [facility] OR;  Service: Vascular;  Laterality: N/A;   ANTERIOR LATERAL LUMBAR FUSION 4 LEVELS Right 06/23/2016   Procedure: Right  Lumbar two-three Lumbar three-four Lumbar four-five Anterolateral lumbar interbody fusion;  Surgeon: Maeola Harman, MD;  Location: [facility] OR;  Service: Neurosurgery;  Laterality: Right;   ANTERIOR LUMBAR FUSION N/A 06/23/2016   Procedure: Lumbar five -sacral one  Anterior lumbar interbody fusion with Dr. Tawanna Cooler Early for approach;  Surgeon: Maeola Harman, MD;  Location: [facility] OR;  Service: Neurosurgery;  Laterality: N/A;    APPLICATION OF INTRAOPERATIVE CT SCAN N/A 06/26/2016   Procedure: APPLICATION OF INTRAOPERATIVE CT SCAN;  Surgeon: Maeola Harman, MD;  Location: [facility] OR;  Service: Neurosurgery;  Laterality: N/A;   BACK SURGERY  2018   CARDIAC CATHETERIZATION     CARDIOVASCULAR STRESS TEST     Negative in May 2014   COLONOSCOPY     HERNIA REPAIR     umbilical   HIP CLOSED REDUCTION Left 10/03/2018   Procedure: CLOSED REDUCTION HIP;  Surgeon: Samson Frederic, MD;  Location: [facility] ORS;  Service: Orthopedics;  Laterality: Left;   IR IVC FILTER PLMT / S&I /IMG GUID/MOD SED  03/26/2018   IR RADIOLOGIST EVAL & MGMT  04/16/2019   JOINT REPLACEMENT     LEFT HEART CATH AND CORONARY ANGIOGRAPHY N/A 06/21/2018   Procedure: LEFT HEART CATH AND CORONARY ANGIOGRAPHY;  Surgeon: Corky Crafts, MD;  Location: [facility] INVASIVE CV LAB;  Service: Cardiovascular;  Laterality: N/A;   POSTERIOR LUMBAR FUSION 4 LEVEL N/A 06/26/2016   Procedure: Thoracic ten to Pelvis fixation with AIRO;  Surgeon: Maeola Harman, MD;  Location: [facility] OR;  Service: Neurosurgery;  Laterality: N/A;   REVERSE SHOULDER ARTHROPLASTY Right 09/18/2019   Procedure: REVERSE SHOULDER ARTHROPLASTY;  Surgeon: Francena Hanly, MD;  Location: [facility] ORS;  Service: Orthopedics;  Laterality: Right;    REVERSE SHOULDER ARTHROPLASTY Left 06/10/2020  Procedure: REVERSE SHOULDER ARTHROPLASTY;  Surgeon: Francena Hanly, MD;  Location: [facility] ORS;  Service: Orthopedics;  Laterality: Left;    TENDON REPAIR     right hand   TOTAL HIP ARTHROPLASTY Right 05/25/2014   Procedure: RIGHT TOTAL HIP ARTHROPLASTY ANTERIOR APPROACH;  Surgeon: Samson Frederic, MD;  Location: [facility] OR;  Service: Orthopedics;  Laterality: Right;   TOTAL HIP ARTHROPLASTY Left 03/27/2018   Procedure: TOTAL HIP ARTHROPLASTY ANTERIOR APPROACH- DA complex;  Surgeon: Samson Frederic, MD;  Location: [facility] ORS;  Service: Orthopedics;  Laterality: Left;   TOTAL KNEE ARTHROPLASTY Bilateral    TOTAL SHOULDER REVISION Left  10/14/2020   Procedure: Revision Left reverse shoulder arthroplasty;  Surgeon: Francena Hanly, MD;  Location: [facility] ORS;  Service: Orthopedics;  Laterality: Left;    Social History: Social History   Socioeconomic History   Marital status: Married    Spouse name: Amil Amen "[name]" Suiter   Number of children: 2   Years of education: 12   Highest education level: Not on file  Occupational History   Occupation: Astronomer    Comment: [phone]  Tobacco Use   Smoking status: Former    Packs/day: 1.00    Years: 25.00    Additional pack years: 0.00    Total pack years: 25.00    Types: Cigarettes    Quit date: 2000    Years since quitting: 24.4   Smokeless tobacco: Current    Types: Snuff  Vaping Use   Vaping Use: Never used  Substance and Sexual Activity   Alcohol use: Yes    Alcohol/week: 14.0 standard drinks of alcohol    Types: 14 Cans of beer per week    Comment: 2 beers QD   Drug use: No   Sexual activity: Not on file  Other Topics Concern   Not on file  Social History Narrative   Lives with wife in a one story home.  Has 2 children.  Works as a Control and instrumentation engineer.  Education: high school.    Social Determinants of Health   Financial Resource Strain: Not on file  Food Insecurity: No Food Insecurity (09/05/2018)   Hunger Vital Sign    Worried About Running Out of Food in the Last Year: Never true    Ran Out of Food in the Last Year: Never true  Transportation Needs: No Transportation Needs (09/05/2018)   PRAPARE - Administrator, Civil Service (Medical): No    Lack of Transportation (Non-Medical): No  Physical Activity: Inactive (09/05/2018)   Exercise Vital Sign    Days of Exercise per Week: 0 days    Minutes of Exercise per Session: 0 min  Stress: Stress Concern Present (09/05/2018)   [employer] of Occupational Health - Occupational Stress Questionnaire    Feeling of Stress : To some extent  Social Connections: Not on file   Family  History: Family History  Adopted: Yes  Problem Relation Age of Onset   Healthy Daughter    Healthy Daughter    Allergies: No Known Allergies Medications: See med rec.  Review of Systems: No headache, visual changes, nausea, vomiting, diarrhea, constipation, dizziness, abdominal pain, skin rash, fevers, chills, night sweats, swollen lymph nodes, weight loss, chest pain, body aches, joint swelling, muscle aches, shortness of breath, mood changes, visual or auditory hallucinations.  Objective:    General: Well Developed, well nourished, and in no acute distress.  Neuro: Alert and oriented x3, extra-ocular muscles intact, sensation grossly intact. Cranial nerves II  through XII are intact, motor, sensory, and coordinative functions are all intact. HEENT: Normocephalic, atraumatic, pupils equal round reactive to light, neck supple, no masses, no lymphadenopathy, thyroid nonpalpable. Oropharynx, nasopharynx, external ear canals are unremarkable. Skin: Warm and dry, no rashes noted.  There is a 1 cm dome-shaped lesion upper back, overlying telangiectasias. Cardiac: Regular rate and rhythm, no murmurs rubs or gallops.  Respiratory: Clear to auscultation bilaterally. Not using accessory muscles, speaking in full sentences.  Abdominal: Soft, nontender, nondistended, positive bowel sounds, no masses, no organomegaly.  Musculoskeletal: Shoulder, elbow, wrist, hip, knee, ankle stable, and with full range of motion.  Impression and Recommendations:    The patient was counselled, risk factors were discussed, anticipatory guidance given.  Annual physical exam Annual physical as above, up-to-date on screening measures, he did recently get labs with healthy weight and wellness.  Spinal stenosis, lumbar region, with neurogenic claudication Pope has had multilevel lumbar fusions, he has had severe spinal stenosis. He has persistent neuropathy in his legs, he does have significant sensations of being off  balance. This is the 1 thing that he would like to improve in his life. He has done some vestibular and balance physical therapy. I would like him to touch base again with neurology, he did see Dr. Allena Katz with [facility] neurology in the past, I would like a second opinion as to whether we have any additional interventions to help his balance.  Skin lesion of back There is a dome-shaped erythematous lesion, with telangiectasias on the upper back, I am concerned this is a basal cell carcinoma, I would like Darrow to return for shave biopsy.   ____________________________________________ Ihor Austin. Benjamin Stain, M.D., ABFM., CAQSM., AME. Primary Care and Sports Medicine [facility]  Adjunct Professor of Family Medicine  [employer]  Restaurant manager, fast food

## 2022-06-07 NOTE — Assessment & Plan Note (Signed)
There is a dome-shaped erythematous lesion, with telangiectasias on the upper back, I am concerned this is a basal cell carcinoma, I would like Nil to return for shave biopsy.

## 2022-06-07 NOTE — Assessment & Plan Note (Signed)
Travis Huff has had multilevel lumbar fusions, he has had severe spinal stenosis. He has persistent neuropathy in his legs, he does have significant sensations of being off balance. This is the 1 thing that he would like to improve in his life. He has done some vestibular and balance physical therapy. I would like him to touch base again with neurology, he did see Dr. Allena Katz with [facility] neurology in the past, I would like a second opinion as to whether we have any additional interventions to help his balance.

## 2022-06-07 NOTE — Assessment & Plan Note (Signed)
Annual physical as above, up-to-date on screening measures, he did recently get labs with healthy weight and wellness.

## 2022-06-12 ENCOUNTER — Ambulatory Visit: Payer: Commercial Managed Care - PPO

## 2022-06-12 DIAGNOSIS — I480 Paroxysmal atrial fibrillation: Secondary | ICD-10-CM | POA: Diagnosis not present

## 2022-06-14 ENCOUNTER — Ambulatory Visit: Payer: Commercial Managed Care - PPO | Admitting: Sports Medicine

## 2022-06-14 DIAGNOSIS — L989 Disorder of the skin and subcutaneous tissue, unspecified: Secondary | ICD-10-CM | POA: Diagnosis not present

## 2022-06-14 NOTE — Assessment & Plan Note (Signed)
Shave see of potential basal cell carcinoma, return in 2 weeks for wound check.

## 2022-06-14 NOTE — Progress Notes (Signed)
    Procedures performed today:    Procedure: Shave biopsy of 0.5 cm mid back skin lesion Risks, benefits, and alternatives explained and consent obtained. Time out conducted. Surface prepped with alcohol. 2cc lidocaine with epinephine infiltrated in a field block. Adequate anesthesia ensured. Area prepped and draped in a sterile fashion. Excision performed with: Using a derma blade I made an excision into the deep dermis, I then used a Hyfrecator to achieve hemostasis. Hemostasis achieved. Pt stable.  Independent interpretation of notes and tests performed by another provider:   None.  Brief History, Exam, Impression, and Recommendations:    Skin lesion of back Shave see of potential basal cell carcinoma, return in 2 weeks for wound check.    ____________________________________________ Ihor Austin. Benjamin Stain, M.D., ABFM., CAQSM., AME. Primary Care and Sports Medicine [facility]  Adjunct Professor of Family Medicine  [employer]  Restaurant manager, fast food

## 2022-06-19 ENCOUNTER — Encounter: Payer: Self-pay | Admitting: Cardiology

## 2022-06-19 ENCOUNTER — Ambulatory Visit: Payer: Commercial Managed Care - PPO | Attending: Cardiology | Admitting: Cardiology

## 2022-06-19 VITALS — BP 124/74 | HR 70 | Ht 71.0 in | Wt 202.1 lb

## 2022-06-19 DIAGNOSIS — Z79899 Other long term (current) drug therapy: Secondary | ICD-10-CM

## 2022-06-19 DIAGNOSIS — D6869 Other thrombophilia: Secondary | ICD-10-CM

## 2022-06-19 DIAGNOSIS — I480 Paroxysmal atrial fibrillation: Secondary | ICD-10-CM | POA: Diagnosis not present

## 2022-06-19 DIAGNOSIS — I493 Ventricular premature depolarization: Secondary | ICD-10-CM | POA: Diagnosis not present

## 2022-06-19 DIAGNOSIS — I10 Essential (primary) hypertension: Secondary | ICD-10-CM

## 2022-06-19 NOTE — Patient Instructions (Signed)
Medication Instructions:  Your physician recommends that you continue on your current medications as directed. Please refer to the Current Medication list given to you today.  *If you need a refill on your cardiac medications before your next appointment, please call your pharmacy*   Lab Work: Today:  CBC If you have labs (blood work) drawn today and your tests are completely normal, you will receive your results only by: MyChart Message (if you have MyChart) OR A paper copy in the mail If you have any lab test that is abnormal or we need to change your treatment, we will call you to review the results.   Testing/Procedures: None ordered   Follow-Up: At [facility], you and your health needs are our priority.  As part of our continuing mission to provide you with exceptional heart care, we have created designated Provider Care Teams.  These Care Teams include your primary Cardiologist (physician) and Advanced Practice Providers (APPs -  Physician Assistants and Nurse Practitioners) who all work together to provide you with the care you need, when you need it.  Remote monitoring is used to monitor your Pacemaker or ICD from home. This monitoring reduces the number of office visits required to check your device to one time per year. It allows [place] to keep an eye on the functioning of your device to ensure it is working properly. You are scheduled for a device check from home on 07/04/2022. You may send your transmission at any time that day. If you have a wireless device, the transmission will be sent automatically. After your physician reviews your transmission, you will receive a postcard with your next transmission date.  Your next appointment:   1 year(s)  The format for your next appointment:   In Person  Provider:   Loman Brooklyn, MD{   Thank you for choosing [facility]!!   Dory Horn, RN [phone]    Other Instructions

## 2022-06-19 NOTE — Progress Notes (Signed)
  Electrophysiology Office Note:   Date:  06/19/2022  ID:  Travis Huff, DOB Apr 12, 1960, MRN [id number]  Primary Cardiologist: Lesleigh Noe, MD (Inactive) Electrophysiologist: Kolbey Teichert Jorja Loa, MD      History of Present Illness:   Travis Huff is a 62 y.o. male with h/o atrial fibrillation, coronary artery disease, hypertension seen today for routine electrophysiology followup.  Since last being seen in our clinic the patient reports doing well.  He has had 3 episodes of feeling palpitations.  Palpitations occur at times when he is not active.  He states that they last for a few minutes at a time.  He feels at times nauseous when they occur.  In between episodes, he is doing well without complaint.  No exacerbating factors.  He says that he gets nauseous and feels like he has to vomit when he has these episodes.  After this, he feels completely normal.  he denies chest pain, palpitations, dyspnea, PND, orthopnea, nausea, vomiting, dizziness, syncope, edema, weight gain, or early satiety.   Review of systems complete and found to be negative unless listed in HPI.   Studies Reviewed:    EKG is ordered today. Personal review shows sinus rhythm, PVCs   Risk Assessment/Calculations:    CHA2DS2-VASc Score = 3   This indicates a 3.2% annual risk of stroke. The patient's score is based upon: CHF History: 1 HTN History: 1 Diabetes History: 0 Stroke History: 0 Vascular Disease History: 1 Age Score: 0 Gender Score: 0             Physical Exam:   VS:  BP 124/74   Pulse 70   Ht 5\' 11"  (1.803 m)   Wt 202 lb 1.9 oz (91.7 kg)   SpO2 99%   BMI 28.19 kg/m    Wt Readings from Last 3 Encounters:  06/19/22 202 lb 1.9 oz (91.7 kg)  06/07/22 207 lb (93.9 kg)  05/31/22 200 lb (90.7 kg)     GEN: Well nourished, well developed in no acute distress NECK: No JVD; No carotid bruits CARDIAC: Regular rate and rhythm, no murmurs, rubs, gallops RESPIRATORY:  Clear to auscultation  without rales, wheezing or rhonchi  ABDOMEN: Soft, non-tender, non-distended EXTREMITIES:  No edema; No deformity   ASSESSMENT AND PLAN:    1.  Paroxysmal atrial fibrillation: Currently on Eliquis and Multaq.  He remains in sinus rhythm.  Currently feeling well.  No changes.  2.  Coronary disease: Distal LAD stenosis.  Thought due to dissection.  No current chest pain.  3.  Cardiomyopathy: Ejection fraction is improved  4.  Hypertension: Currently well-controlled  5.  Secondary hypercoagulable state: Currently on Eliquis for atrial fibrillation  6.  PVCs: In bigeminy today.  His triggered episodes on ILR are consistent with PVCs.  He is having a low burden.  At this point, we Taliyah Watrous continue with current management.  Follow up with Dr. Elberta Fortis in 12 months  Signed, Domonique Cothran Jorja Loa, MD

## 2022-06-20 LAB — CBC
Hematocrit: 48.5 % (ref 37.5–51.0)
Hemoglobin: 15.7 g/dL (ref 13.0–17.7)
MCH: 31.4 pg (ref 26.6–33.0)
MCHC: 32.4 g/dL (ref 31.5–35.7)
MCV: 97 fL (ref 79–97)
Platelets: 213 10*3/uL (ref 150–450)
RBC: 5 x10E6/uL (ref 4.14–5.80)
RDW: 12.7 % (ref 11.6–15.4)
WBC: 8.1 10*3/uL (ref 3.4–10.8)

## 2022-06-20 NOTE — Progress Notes (Signed)
[facility] Summary Report / Loop Recorder 

## 2022-06-28 ENCOUNTER — Ambulatory Visit: Payer: Commercial Managed Care - PPO | Admitting: Sports Medicine

## 2022-07-02 ENCOUNTER — Other Ambulatory Visit: Payer: Self-pay | Admitting: Sports Medicine

## 2022-07-02 DIAGNOSIS — F5101 Primary insomnia: Secondary | ICD-10-CM

## 2022-07-03 ENCOUNTER — Other Ambulatory Visit: Payer: Self-pay

## 2022-07-04 ENCOUNTER — Ambulatory Visit (INDEPENDENT_AMBULATORY_CARE_PROVIDER_SITE_OTHER): Payer: Commercial Managed Care - PPO

## 2022-07-04 DIAGNOSIS — I255 Ischemic cardiomyopathy: Secondary | ICD-10-CM

## 2022-07-04 LAB — CUP PACEART REMOTE DEVICE CHECK
Date Time Interrogation Session: 20240630230650
Implantable Pulse Generator Implant Date: 20220727

## 2022-07-05 ENCOUNTER — Other Ambulatory Visit (HOSPITAL_COMMUNITY): Payer: Self-pay | Admitting: Cardiology

## 2022-07-05 ENCOUNTER — Other Ambulatory Visit (HOSPITAL_BASED_OUTPATIENT_CLINIC_OR_DEPARTMENT_OTHER): Payer: Self-pay

## 2022-07-05 MED ORDER — APIXABAN 5 MG PO TABS
5.0000 mg | ORAL_TABLET | Freq: Two times a day (BID) | ORAL | 1 refills | Status: DC
  Filled 2022-07-05: qty 180, 90d supply, fill #0
  Filled 2022-10-21: qty 180, 90d supply, fill #1

## 2022-07-05 NOTE — Telephone Encounter (Signed)
Prescription refill request for Eliquis received. Indication:afib Last office visit:6/24 Scr:0.85  4/24 Age: 62 Weight:91.7  kg  Prescription refilled

## 2022-07-07 ENCOUNTER — Other Ambulatory Visit (HOSPITAL_BASED_OUTPATIENT_CLINIC_OR_DEPARTMENT_OTHER): Payer: Self-pay

## 2022-07-07 ENCOUNTER — Other Ambulatory Visit: Payer: Self-pay | Admitting: Cardiology

## 2022-07-07 MED ORDER — LISINOPRIL-HYDROCHLOROTHIAZIDE 20-12.5 MG PO TABS
2.0000 | ORAL_TABLET | Freq: Every day | ORAL | 6 refills | Status: DC
  Filled 2022-07-07: qty 60, 30d supply, fill #0
  Filled 2022-08-12: qty 60, 30d supply, fill #1
  Filled 2022-09-09: qty 60, 30d supply, fill #2
  Filled 2022-10-07: qty 60, 30d supply, fill #3
  Filled 2022-11-12: qty 60, 30d supply, fill #4
  Filled 2022-12-17: qty 60, 30d supply, fill #5
  Filled 2023-01-14: qty 60, 30d supply, fill #6

## 2022-07-07 MED ORDER — CYCLOBENZAPRINE HCL 10 MG PO TABS
10.0000 mg | ORAL_TABLET | Freq: Every day | ORAL | 3 refills | Status: DC
Start: 2022-07-07 — End: 2023-07-01
  Filled 2022-07-07 – 2022-07-14 (×3): qty 90, 90d supply, fill #0
  Filled 2022-10-07: qty 90, 90d supply, fill #1
  Filled 2023-01-07: qty 90, 90d supply, fill #2
  Filled 2023-04-08: qty 90, 90d supply, fill #3

## 2022-07-10 ENCOUNTER — Other Ambulatory Visit (HOSPITAL_BASED_OUTPATIENT_CLINIC_OR_DEPARTMENT_OTHER): Payer: Self-pay

## 2022-07-12 ENCOUNTER — Ambulatory Visit (INDEPENDENT_AMBULATORY_CARE_PROVIDER_SITE_OTHER): Payer: Commercial Managed Care - PPO | Admitting: Family Medicine

## 2022-07-14 ENCOUNTER — Other Ambulatory Visit (HOSPITAL_BASED_OUTPATIENT_CLINIC_OR_DEPARTMENT_OTHER): Payer: Self-pay

## 2022-07-17 ENCOUNTER — Ambulatory Visit: Payer: Commercial Managed Care - PPO

## 2022-07-17 ENCOUNTER — Encounter (INDEPENDENT_AMBULATORY_CARE_PROVIDER_SITE_OTHER): Payer: Self-pay | Admitting: Family Medicine

## 2022-07-17 ENCOUNTER — Other Ambulatory Visit (HOSPITAL_BASED_OUTPATIENT_CLINIC_OR_DEPARTMENT_OTHER): Payer: Self-pay

## 2022-07-17 ENCOUNTER — Ambulatory Visit (INDEPENDENT_AMBULATORY_CARE_PROVIDER_SITE_OTHER): Payer: Commercial Managed Care - PPO | Admitting: Family Medicine

## 2022-07-17 VITALS — BP 126/70 | HR 67 | Temp 98.2°F | Ht 71.0 in | Wt 203.0 lb

## 2022-07-17 DIAGNOSIS — R7303 Prediabetes: Secondary | ICD-10-CM

## 2022-07-17 DIAGNOSIS — E559 Vitamin D deficiency, unspecified: Secondary | ICD-10-CM

## 2022-07-17 DIAGNOSIS — E538 Deficiency of other specified B group vitamins: Secondary | ICD-10-CM

## 2022-07-17 DIAGNOSIS — I10 Essential (primary) hypertension: Secondary | ICD-10-CM | POA: Diagnosis not present

## 2022-07-17 DIAGNOSIS — Z6828 Body mass index (BMI) 28.0-28.9, adult: Secondary | ICD-10-CM

## 2022-07-17 NOTE — Progress Notes (Signed)
[name], D.O.  ABFM, ABOM Specializing in Clinical Bariatric Medicine  Office located at: [address] [place], [place]  [address]     Assessment and Plan:    Vitamin D deficiency Assessment: Condition is Controlled.  Her levels are within the recommended 50-80 range. She continues Cholecalciferol 1000 lU qd. Lab Results  Component Value Date   VD25OH 54.1 04/18/2022   VD25OH 50.4 09/28/2021   VD25OH 46.4 01/10/2021   Plan: Continue with OTC vitamin D supplement as directed by Dr. Val Eagle. Weight loss will likely improve availability of vitamin D, thus encouraged Olman to continue with meal plan and their weight loss efforts to further improve this condition. We will need to monitor levels regularly (every 3-4 mo on average) to keep levels within normal limits and prevent over supplementation.   Pre-diabetes Assessment: Condition is stable. This diet/exercise controlled. He denies cravings or hunger and states that he continues to have a lot of energy.  Lab Results  Component Value Date   HGBA1C 5.4 04/18/2022   HGBA1C 5.1 09/28/2021   HGBA1C 5.4 01/10/2021   INSULIN 6.9 04/18/2022   INSULIN 5.8 09/28/2021   INSULIN 10.3 02/21/2021    Plan: Reminded Beckie Busing that lifestyle changes are the first line treatment option for most all disease processes.  Education provided that "food is medicine" and I encouraged him to be mindful of how certain foods can improve or worsen his medical conditions. Continue to decrease simple carbs/ sugars; increase fiber and proteins -> follow his meal plan. Anticipatory guidance given. We will recheck A1c and fasting insulin level in approximately 3 months from last check, or as deemed appropriate.    Other hyperlipidemia JAS REINHOLTZ is tolerating Lipitor well without side effects.  Medication compliance is good as patient endorses taking it as prescribed.  Symptoms are stable and the patient denies additional concerns regarding  this condition.     Assessment: Condition is stable. He continues Atrovastin 80mg  daily and is tolerating it well with no side effects.   Lab Results  Component Value Date   CHOL 146 04/18/2022   HDL 80 04/18/2022   LDLCALC 54 04/18/2022   TRIG 54 04/18/2022   CHOLHDL 1.8 04/18/2022   Plan: I stressed the importance that patient continue with our prudent nutritional plan that is low in simple carbohydrates, saturated fats and trans fats to goal of 5-10% weight loss to achieve significant health benefits.  Pt encouraged to continually advance exercise and cardiovascular fitness as tolerated throughout weight loss journey.   Essential hypertension Assessment: Condition is Controlled. This is diet/exercise controlled. Pt informed me that he believed he was Afib episodes and did discuss this with his PCP. He was placed on a monitor for checking.  Last 3 blood pressure readings in our office are as follows: BP Readings from Last 3 Encounters:  07/17/22 126/70  06/19/22 124/74  06/07/22 125/78   Plan: Ambulatory blood pressure monitoring encouraged.  Reminded patient that if they ever feel poorly in any way, to check their blood pressure and pulse as well. We will continue to monitor closely alongside PCP/ specialists.  Pt reminded to also f/up with those individuals as instructed by them. We will continue to monitor symptoms as they relate to the his weight loss journey.   B12 deficiency Assessment: Condition is Controlled. as his levels are well above 500. Continues OTC B12 supplemet.  Lab Results  Component Value Date   VITAMINB12 666 04/18/2022  Plan: Continue with Cyanocobalamin B12 supplement as directed. We will continue to monitor as deemed clinically necessary.     TREATMENT PLAN FOR OBESITY: BMI 28.0-28.9,adult-current bmi 28.33 Morbid obesity (HCC)-starting bmi 48.89/DATE 02/03/20 Assessment:  HADY BRINER is here to discuss his progress with his obesity treatment  plan along with follow-up of his obesity related diagnoses. See Medical Weight Management Flowsheet for complete bioelectrical impedance results.  Condition is docourse:  not optimized. Biometric data collected today, was reviewed with patient.   Since last office visit on 05/31/2022 patient's  Muscle mass has increased by 1.6lb. Fat mass has increased by 0.8lb. Total body water has increased by 1.8lb.  Counseling done on how various foods will affect these numbers and how to maximize success  Total lbs lost to date: 147lbs Total weight loss percentage to date: 42%   Plan: Continue with adhering to the category 4 meal plan as directed.   - We discussed adding weightlifting to his exercise regimen and informed him of going to the [facility] to [facility] out - I recommended the following websites: Psychologist, occupational, skinny taste, Mudlogger Intervention Additional resources provided today: patient declined Evidence-based interventions for health behavior change were utilized today including the discussion of self monitoring techniques, problem-solving barriers and SMART goal setting techniques.   Regarding patient's less desirable eating habits and patterns, we employed the technique of small changes.  Pt will specifically work on: add strength training for next visit.    Recommended Physical Activity Goals  Naithan has been advised to slowly work up to 150 minutes of moderate intensity aerobic activity a week and strengthening exercises 2-3 times per week for cardiovascular health, weight loss maintenance and preservation of muscle mass.   He has agreed to Think about ways to increase daily physical activity and overcoming barriers to exercise and Start strengthening exercises with a goal of 2-3 sessions a week    FOLLOW UP: Return in about 8 weeks (around 09/11/2022). He was informed of the importance of frequent follow up visits to maximize his success with intensive lifestyle  modifications for his multiple health conditions.  Subjective:  Chief complaint: Obesity Landan is here to discuss his progress with his obesity treatment plan. He is on the the Category 3 Plan and states he is following his eating plan approximately 85% of the time. He states he is walking 45 minutes 3 days per week.  Interval History:  KIAM TALFORD is here for a follow up office visit. Since last OV he has returned from vacation from the beach and informed me that he ate and drank off plan. But when he returned he went back to following the category 3 meal plan.   Review of Systems:  Pertinent positives were addressed with patient today.  Reviewed by clinician on day of visit: allergies, medications, problem list, medical history, surgical history, family history, social history, and previous encounter notes.  Weight Summary and Biometrics   Weight Lost Since Last Visit: 0lb  Weight Gained Since Last Visit: 3lb   Vitals Temp: 98.2 F (36.8 C) BP: 126/70 Pulse Rate: 67 SpO2: 99 %   Anthropometric Measurements Height: 5\' 11"  (1.803 m) Weight: 203 lb (92.1 kg) BMI (Calculated): 28.33 Weight at Last Visit: 200lb Weight Lost Since Last Visit: 0lb Weight Gained Since Last Visit: 3lb Starting Weight: 350lb Total Weight Loss (lbs): 147 lb (66.7 kg) Peak Weight: 350lb   Body Composition  Body Fat %: 34.5 % Fat  Mass (lbs): 70.2 lbs Muscle Mass (lbs): 126.4 lbs Total Body Water (lbs): 100 lbs Visceral Fat Rating : 17   Other Clinical Data Fasting: no Labs: no Today's Visit #: 34 Starting Date: 02/03/20   Objective:   PHYSICAL EXAM: Blood pressure 126/70, pulse 67, temperature 98.2 F (36.8 C), height 5\' 11"  (1.803 m), weight 203 lb (92.1 kg), SpO2 99%. Body mass index is 28.31 kg/m.  General: Well Developed, well nourished, and in no acute distress.  HEENT: Normocephalic, atraumatic Skin: Warm and dry, cap RF less 2 sec, good turgor Chest:  Normal  excursion, shape, no gross abn Respiratory: speaking in full sentences, no conversational dyspnea NeuroM-Sk: Ambulates w/o assistance, moves * 4 Psych: A and O *3, insight good, mood-full  DIAGNOSTIC DATA REVIEWED:  BMET    Component Value Date/Time   NA 139 04/18/2022 1057   K 5.0 04/18/2022 1057   CL 100 04/18/2022 1057   CO2 22 04/18/2022 1057   GLUCOSE 77 04/18/2022 1057   GLUCOSE 107 (H) 10/13/2020 0911   BUN 26 04/18/2022 1057   CREATININE 0.85 04/18/2022 1057   CREATININE 0.97 06/19/2018 1109   CALCIUM 9.6 04/18/2022 1057   GFRNONAA >60 10/13/2020 0911   GFRNONAA 86 06/19/2018 1109   GFRAA 104 02/03/2020 1334   GFRAA 100 06/19/2018 1109   Lab Results  Component Value Date   HGBA1C 5.4 04/18/2022   HGBA1C  12/14/2009    5.5 (NOTE)                                                                       According to the ADA Clinical Practice Recommendations for 2011, when HbA1c is used as a screening test:   >=6.5%   Diagnostic of Diabetes Mellitus           (if abnormal result  is confirmed)  5.7-6.4%   Increased risk of developing Diabetes Mellitus  References:Diagnosis and Classification of Diabetes Mellitus,Diabetes Care,2011,34(Suppl 1):S62-S69 and Standards of Medical Care in         Diabetes - 2011,Diabetes Care,2011,34  (Suppl 1):S11-S61.   Lab Results  Component Value Date   INSULIN 6.9 04/18/2022   INSULIN 17.2 02/03/2020   Lab Results  Component Value Date   TSH 2.440 02/21/2021   CBC    Component Value Date/Time   WBC 8.1 06/19/2022 1439   WBC 8.9 10/13/2020 0911   RBC 5.00 06/19/2022 1439   RBC 5.04 10/13/2020 0911   HGB 15.7 06/19/2022 1439   HCT 48.5 06/19/2022 1439   PLT 213 06/19/2022 1439   MCV 97 06/19/2022 1439   MCH 31.4 06/19/2022 1439   MCH 32.1 10/13/2020 0911   MCHC 32.4 06/19/2022 1439   MCHC 33.6 10/13/2020 0911   RDW 12.7 06/19/2022 1439   Iron Studies No results found for: "IRON", "TIBC", "FERRITIN", "IRONPCTSAT" Lipid Panel      Component Value Date/Time   CHOL 146 04/18/2022 1057   TRIG 54 04/18/2022 1057   HDL 80 04/18/2022 1057   CHOLHDL 1.8 04/18/2022 1057   CHOLHDL 2.8 11/20/2018 1147   VLDL 15 05/23/2012 0610   LDLCALC 54 04/18/2022 1057   LDLCALC 76 11/20/2018 1147   Hepatic Function Panel     Component Value  Date/Time   PROT 6.6 04/18/2022 1057   ALBUMIN 4.5 04/18/2022 1057   AST 33 04/18/2022 1057   AST 22 02/12/2018 1258   ALT 24 04/18/2022 1057   ALT 19 02/12/2018 1258   ALKPHOS 83 04/18/2022 1057   BILITOT 0.4 04/18/2022 1057   BILITOT 0.5 02/12/2018 1258   BILIDIR 0.1 11/20/2018 1151   IBILI 0.3 11/20/2018 1151      Component Value Date/Time   TSH 2.440 02/21/2021 1014   Nutritional Lab Results  Component Value Date   VD25OH 54.1 04/18/2022   VD25OH 50.4 09/28/2021   VD25OH 46.4 01/10/2021    Attestations:   I, Clinical biochemist, acting as a Stage manager for [name], DO., have compiled all relevant documentation for today's office visit on behalf of Thomasene Lot, DO, while in the presence of [name], DO.  I have reviewed the above documentation for accuracy and completeness, and I agree with the above. [name], D.O.  The 21st Century Cures Act was signed into law in 2016 which includes the topic of electronic health records.  This provides immediate access to information in MyChart.  This includes consultation notes, operative notes, office notes, lab results and pathology reports.  If you have any questions about what you read please let us know at your next visit so we can discuss your concerns and take corrective action if need be.  We are right here with you.

## 2022-07-18 DIAGNOSIS — I255 Ischemic cardiomyopathy: Secondary | ICD-10-CM

## 2022-07-24 ENCOUNTER — Other Ambulatory Visit (HOSPITAL_COMMUNITY): Payer: Self-pay | Admitting: Sports Medicine

## 2022-07-24 DIAGNOSIS — F5101 Primary insomnia: Secondary | ICD-10-CM

## 2022-07-25 ENCOUNTER — Other Ambulatory Visit (HOSPITAL_BASED_OUTPATIENT_CLINIC_OR_DEPARTMENT_OTHER): Payer: Self-pay

## 2022-07-25 MED ORDER — ZOLPIDEM TARTRATE 10 MG PO TABS
10.0000 mg | ORAL_TABLET | Freq: Every evening | ORAL | 1 refills | Status: DC | PRN
Start: 2022-07-25 — End: 2023-01-21
  Filled 2022-07-25: qty 90, 90d supply, fill #0
  Filled 2022-10-21: qty 90, 90d supply, fill #1

## 2022-07-28 NOTE — Progress Notes (Signed)
[facility] Summary Report / Loop Recorder 

## 2022-08-02 ENCOUNTER — Ambulatory Visit: Payer: Commercial Managed Care - PPO

## 2022-08-02 DIAGNOSIS — I255 Ischemic cardiomyopathy: Secondary | ICD-10-CM

## 2022-08-02 DIAGNOSIS — M13832 Other specified arthritis, left wrist: Secondary | ICD-10-CM | POA: Diagnosis not present

## 2022-08-02 DIAGNOSIS — M13841 Other specified arthritis, right hand: Secondary | ICD-10-CM | POA: Diagnosis not present

## 2022-08-02 DIAGNOSIS — M13831 Other specified arthritis, right wrist: Secondary | ICD-10-CM | POA: Diagnosis not present

## 2022-08-02 DIAGNOSIS — M13842 Other specified arthritis, left hand: Secondary | ICD-10-CM | POA: Diagnosis not present

## 2022-08-07 ENCOUNTER — Telehealth: Payer: Self-pay | Admitting: *Deleted

## 2022-08-07 ENCOUNTER — Ambulatory Visit: Payer: Commercial Managed Care - PPO

## 2022-08-07 NOTE — Telephone Encounter (Signed)
   Pre-operative Risk Assessment    Patient Name: Travis Huff  DOB: December 15, 1960 MRN: [id number]      Request for Surgical Clearance    Procedure:   RIGHT THUMB MCP JOINT FUSION, RIGHT THUMB A1 PULLEY RELEASE  Date of Surgery:  Clearance 08/22/22                                 Surgeon:  DR. Cranston Neighbor Surgeon's Group or Practice Name:  [facility] Phone number:  [phone] Fax number:  [phone]   Type of Clearance Requested:   - Medical  - Pharmacy:  Hold Apixaban (Eliquis) NOT INDICATED   Type of Anesthesia:  Not Indicated   Additional requests/questions:    [name]   08/07/2022, 10:26 AM

## 2022-08-08 NOTE — Telephone Encounter (Signed)
Please advise holding Eliquis prior to right thumb MCP joint fusion, right thumb A1 pulley release on 08/22/2022.  Thank you!  DW

## 2022-08-08 NOTE — Telephone Encounter (Signed)
Patient with diagnosis of afib and recurrent VTE on Eliquis for anticoagulation.    Procedure:  RIGHT THUMB MCP JOINT FUSION, RIGHT THUMB A1 PULLEY RELEASE  Date of procedure: 08/22/22   CHA2DS2-VASc Score = 3   This indicates a 3.2% annual risk of stroke. The patient's score is based upon: CHF History: 1 HTN History: 1 Diabetes History: 0 Stroke History: 0 Vascular Disease History: 1 Age Score: 0 Gender Score: 0      CrCl 97 ml/min Platelet count 213  Per office protocol, patient can hold Eliquis for 2 days prior to procedure.    **This guidance is not considered finalized until pre-operative APP has relayed final recommendations.**

## 2022-08-08 NOTE — Telephone Encounter (Signed)
Dr. Elberta Fortis  You saw this patient on 06/19/2022. Will you please comment on medical clearance for right thumb MCP joint fusion, right thumb A1 pulley release?  Please route your response to P CV DIV Preop. I will communicate with requesting office once you have given recommendations.   Thank you!  Carlos Levering, NP

## 2022-08-09 NOTE — Telephone Encounter (Signed)
   Patient Name: Travis Huff  DOB: April 14, 1960 MRN: [id number]  Primary Cardiologist: Lesleigh Noe, MD (Inactive)  Chart reviewed as part of pre-operative protocol coverage. Given past medical history and time since last visit, based on ACC/AHA guidelines, Travis Huff is at acceptable risk for the planned procedure without further cardiovascular testing.   Per Dr. Elberta Fortis "Intermediate risk for intermediate risk procedure. No further cardiac testing necessary".   Per Pharmacy: Per office protocol, patient can hold Eliquis for 2 days prior to procedure.      I will route this recommendation to the requesting party via Epic fax function and remove from pre-op pool.  Please call with questions.  Joni Reining, NP 08/09/2022, 4:31 PM

## 2022-08-14 ENCOUNTER — Other Ambulatory Visit (HOSPITAL_BASED_OUTPATIENT_CLINIC_OR_DEPARTMENT_OTHER): Payer: Self-pay

## 2022-08-17 NOTE — Progress Notes (Signed)
[facility] Summary Report / Loop Recorder 

## 2022-08-21 ENCOUNTER — Ambulatory Visit: Payer: Commercial Managed Care - PPO

## 2022-08-21 ENCOUNTER — Encounter: Payer: Self-pay | Admitting: Sports Medicine

## 2022-08-22 ENCOUNTER — Other Ambulatory Visit (HOSPITAL_BASED_OUTPATIENT_CLINIC_OR_DEPARTMENT_OTHER): Payer: Self-pay

## 2022-08-22 MED ORDER — AMOXICILLIN 500 MG PO TABS
2000.0000 mg | ORAL_TABLET | ORAL | 0 refills | Status: DC
  Filled 2022-08-22: qty 8, 2d supply, fill #0

## 2022-09-01 ENCOUNTER — Encounter: Payer: Self-pay | Admitting: Cardiology

## 2022-09-05 LAB — CUP PACEART REMOTE DEVICE CHECK
Date Time Interrogation Session: 20240831230445
Implantable Pulse Generator Implant Date: 20220727

## 2022-09-09 ENCOUNTER — Other Ambulatory Visit: Payer: Self-pay | Admitting: Cardiology

## 2022-09-11 ENCOUNTER — Other Ambulatory Visit (HOSPITAL_BASED_OUTPATIENT_CLINIC_OR_DEPARTMENT_OTHER): Payer: Self-pay

## 2022-09-11 ENCOUNTER — Ambulatory Visit: Payer: Commercial Managed Care - PPO

## 2022-09-11 ENCOUNTER — Encounter (INDEPENDENT_AMBULATORY_CARE_PROVIDER_SITE_OTHER): Payer: Self-pay | Admitting: Family Medicine

## 2022-09-11 ENCOUNTER — Ambulatory Visit (INDEPENDENT_AMBULATORY_CARE_PROVIDER_SITE_OTHER): Payer: Commercial Managed Care - PPO | Admitting: Family Medicine

## 2022-09-11 VITALS — BP 127/83 | HR 74 | Temp 98.8°F | Ht 71.0 in | Wt 197.0 lb

## 2022-09-11 DIAGNOSIS — I480 Paroxysmal atrial fibrillation: Secondary | ICD-10-CM

## 2022-09-11 DIAGNOSIS — E538 Deficiency of other specified B group vitamins: Secondary | ICD-10-CM

## 2022-09-11 DIAGNOSIS — I10 Essential (primary) hypertension: Secondary | ICD-10-CM | POA: Diagnosis not present

## 2022-09-11 DIAGNOSIS — Z87891 Personal history of nicotine dependence: Secondary | ICD-10-CM

## 2022-09-11 DIAGNOSIS — Z6828 Body mass index (BMI) 28.0-28.9, adult: Secondary | ICD-10-CM | POA: Diagnosis not present

## 2022-09-11 DIAGNOSIS — R7303 Prediabetes: Secondary | ICD-10-CM | POA: Diagnosis not present

## 2022-09-11 DIAGNOSIS — E559 Vitamin D deficiency, unspecified: Secondary | ICD-10-CM | POA: Diagnosis not present

## 2022-09-11 MED ORDER — MULTAQ 400 MG PO TABS
400.0000 mg | ORAL_TABLET | Freq: Two times a day (BID) | ORAL | 9 refills | Status: DC
  Filled 2022-09-11: qty 60, 30d supply, fill #0
  Filled 2022-10-07: qty 60, 30d supply, fill #1
  Filled 2022-11-12: qty 60, 30d supply, fill #2
  Filled 2022-12-17: qty 60, 30d supply, fill #3
  Filled 2023-01-14: qty 60, 30d supply, fill #4
  Filled 2023-02-11: qty 60, 30d supply, fill #5
  Filled 2023-03-18: qty 60, 30d supply, fill #6
  Filled 2023-04-15: qty 60, 30d supply, fill #7
  Filled 2023-05-11: qty 60, 30d supply, fill #8
  Filled 2023-06-17: qty 60, 30d supply, fill #9

## 2022-09-11 NOTE — Progress Notes (Signed)
Travis Huff, D.O.  ABFM, ABOM Specializing in Clinical Bariatric Medicine  Office located at: [address] [place], [place]  [address]     Assessment and Plan:   Medications Discontinued During This Encounter  Medication Reason   amoxicillin (AMOXIL) 500 MG tablet    Pre-diabetes Assessment: Condition is Not at goal.. His A1c level is at an optimal range and his insulin level is elevated. This is diet/exercise controlled. He denies any hunger or cravings.  Lab Results  Component Value Date   HGBA1C 5.4 04/18/2022   HGBA1C 5.1 09/28/2021   HGBA1C 5.4 01/10/2021   INSULIN 6.9 04/18/2022   INSULIN 5.8 09/28/2021   INSULIN 10.3 02/21/2021    Plan: - Continue his prudent nutritional plan that is low in simple carbohydrates, saturated fats and trans fats to goal of 5-10% weight loss to achieve significant health benefits.  Pt encouraged to continually advance exercise and cardiovascular fitness as tolerated throughout weight loss journey.   - Continue to decrease simple carbs/ sugars; increase fiber and proteins -> follow his meal plan.   - Explained role of simple carbs and insulin levels on hunger and cravings  - Anticipatory guidance given.    - We will recheck A1c and fasting insulin level in approximately 3 months from last check, or as deemed appropriate.    Vitamin D deficiency Assessment: Condition is Controlled.. His vitamin D level has been well controlled with OTC vitamin D oral supplements. He denies any adverse side effects on this medication and tolerates this well.  Lab Results  Component Value Date   VD25OH 54.1 04/18/2022   VD25OH 50.4 09/28/2021   VD25OH 46.4 01/10/2021   Plan:- Continue OTC oral vitamin D supplements and he was encouraged to continue to take the medicine until told otherwise.     - We will monitor levels regularly every 3-4 mo on average from last check to keep levels within normal limits and prevent over  supplementation.   B12 deficiency Assessment: Condition is Controlled.[name] Pt B-12 level is within the recommended goal. This is controlled with OTC B-12 supplement.  Lab Results  Component Value Date   VITAMINB12 666 04/18/2022   Plan: - Continue OTC oral B-12 supplement at current dose.   - Continue to eat foods rich in B12 such as dark leafy greens, red meats, and certain legumes.    Essential hypertension Assessment: Condition is Controlled.[name] He continues [place] XT and Toprol-XL. His last 3 readings have been well controlled due to is diet.  Last 3 blood pressure readings in our office are as follows: BP Readings from Last 3 Encounters:  09/11/22 127/83  07/17/22 126/70  06/19/22 124/74   Plan: - Continue [place] XT 120mg  daily and Toprol-XL 50mg  daily.   - Lifestyle changes such as following our low salt, heart healthy meal plan and engaging in a regular exercise program discussed.  - Beckie Busing BP is 127/83 at goal today.   - Avoid eating foods that are processed, frozen, or prepackaged to avoid excess salt intake.  - Ambulatory blood pressure monitoring encouraged.    - We will continue to monitor symptoms as they relate to the his weight loss journey.   TREATMENT PLAN FOR OBESITY: BMI 28.0-28.9,adult-current bmi 27.29 Morbid obesity (HCC)-starting bmi 48.89/DATE 02/03/20 Assessment:  Travis Huff is here to discuss his progress with his obesity treatment plan along with follow-up of his obesity related diagnoses. See Medical Weight Management Flowsheet for complete bioelectrical impedance results.  Condition is improving but not optimized. Biometric data collected today, was reviewed with patient.   Since last office visit on 07/17/2022 patient's  Muscle mass has decreased by 3.2lb. Fat mass has decreased by 2.8lb. Total body water has decreased by 2.8lb.  Counseling done on how various foods will affect these numbers and how to maximize success  Total lbs lost  to date: 153 Total weight loss percentage to date: 43.71   Plan:  Leovanni is currently in the action stage of change. As such, his goal is to continue his weight management plan. [name] will work on healthier eating habits and try to follow his meal plan best he can.   Behavioral Intervention Additional resources provided today: category 4 meal plan information Evidence-based interventions for health behavior change were utilized today including the discussion of self monitoring techniques, problem-solving barriers and SMART goal setting techniques.   Regarding patient's less desirable eating habits and patterns, we employed the technique of small changes.  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     He has agreed to Continue current level of physical activity  and Start strengthening exercises with a goal of 2-3 sessions a week    FOLLOW UP: Return in about 8 weeks (around 11/06/2022).  He was informed of the importance of frequent follow up visits to maximize his success with intensive lifestyle modifications for his multiple health conditions.  Subjective:   Chief complaint: Obesity Mehran is here to discuss his progress with his obesity treatment plan. He is on the the Category 4 Plan and states he is following his eating plan approximately 85% of the time. He states he is walking 45-75 minutes 4 days per week.  Interval History:  BRADLY Huff is here for a follow up office visit.     Since last office visit:  He has been great. He does occasionally weigh himself and notices that his weight ranges between 195-197. He will have surgery in 2 weeks for a right thumb joint fusion. He does occasionally go to [facility] once a week. He notes increasing his walking ut has not started strength training.     Review of Systems:  Pertinent positives were addressed with patient today.  Reviewed by clinician on day of visit: allergies,  medications, problem list, medical history, surgical history, family history, social history, and previous encounter notes.  Weight Summary and Biometrics   Weight Lost Since Last Visit: 6lb  Weight Gained Since Last Visit: 0lb   Vitals Temp: 98.8 F (37.1 C) BP: 127/83 Pulse Rate: 74 SpO2: 97 %   Anthropometric Measurements Height: 5\' 11"  (1.803 m) Weight: 197 lb (89.4 kg) BMI (Calculated): 27.49 Weight at Last Visit: 203lb Weight Lost Since Last Visit: 6lb Weight Gained Since Last Visit: 0lb Starting Weight: 350lb Total Weight Loss (lbs): 153 lb (69.4 kg) Peak Weight: 350lb   Body Composition  Body Fat %: 34.2 % Fat Mass (lbs): 67.4 lbs Muscle Mass (lbs): 123.2 lbs Total Body Water (lbs): 97.2 lbs Visceral Fat Rating : 17   Other Clinical Data Fasting: no Labs: no Today's Visit #: 35 Starting Date: 02/03/20     Objective:   PHYSICAL EXAM: Blood pressure 127/83, pulse 74, temperature 98.8 F (37.1 C), height 5\' 11"  (1.803 m), weight 197 lb (89.4 kg), SpO2 97%. Body mass index is 27.48 kg/m.  General: Well Developed, well nourished, and in no acute distress.  HEENT: Normocephalic, atraumatic Skin: Warm and dry, cap RF  less 2 sec, good turgor Chest:  Normal excursion, shape, no gross abn Respiratory: speaking in full sentences, no conversational dyspnea NeuroM-Sk: Ambulates w/o assistance, moves * 4 Psych: A and O *3, insight good, mood-full  DIAGNOSTIC DATA REVIEWED:  BMET    Component Value Date/Time   NA 139 04/18/2022 1057   K 5.0 04/18/2022 1057   CL 100 04/18/2022 1057   CO2 22 04/18/2022 1057   GLUCOSE 77 04/18/2022 1057   GLUCOSE 107 (H) 10/13/2020 0911   BUN 26 04/18/2022 1057   CREATININE 0.85 04/18/2022 1057   CREATININE 0.97 06/19/2018 1109   CALCIUM 9.6 04/18/2022 1057   GFRNONAA >60 10/13/2020 0911   GFRNONAA 86 06/19/2018 1109   GFRAA 104 02/03/2020 1334   GFRAA 100 06/19/2018 1109   Lab Results  Component Value Date    HGBA1C 5.4 04/18/2022   HGBA1C  12/14/2009    5.5 (NOTE)                                                                       According to the ADA Clinical Practice Recommendations for 2011, when HbA1c is used as a screening test:   >=6.5%   Diagnostic of Diabetes Mellitus           (if abnormal result  is confirmed)  5.7-6.4%   Increased risk of developing Diabetes Mellitus  References:Diagnosis and Classification of Diabetes Mellitus,Diabetes Care,2011,34(Suppl 1):S62-S69 and Standards of Medical Care in         Diabetes - 2011,Diabetes Care,2011,34  (Suppl 1):S11-S61.   Lab Results  Component Value Date   INSULIN 6.9 04/18/2022   INSULIN 17.2 02/03/2020   Lab Results  Component Value Date   TSH 2.440 02/21/2021   CBC    Component Value Date/Time   WBC 8.1 06/19/2022 1439   WBC 8.9 10/13/2020 0911   RBC 5.00 06/19/2022 1439   RBC 5.04 10/13/2020 0911   HGB 15.7 06/19/2022 1439   HCT 48.5 06/19/2022 1439   PLT 213 06/19/2022 1439   MCV 97 06/19/2022 1439   MCH 31.4 06/19/2022 1439   MCH 32.1 10/13/2020 0911   MCHC 32.4 06/19/2022 1439   MCHC 33.6 10/13/2020 0911   RDW 12.7 06/19/2022 1439   Iron Studies No results found for: "IRON", "TIBC", "FERRITIN", "IRONPCTSAT" Lipid Panel     Component Value Date/Time   CHOL 146 04/18/2022 1057   TRIG 54 04/18/2022 1057   HDL 80 04/18/2022 1057   CHOLHDL 1.8 04/18/2022 1057   CHOLHDL 2.8 11/20/2018 1147   VLDL 15 05/23/2012 0610   LDLCALC 54 04/18/2022 1057   LDLCALC 76 11/20/2018 1147   Hepatic Function Panel     Component Value Date/Time   PROT 6.6 04/18/2022 1057   ALBUMIN 4.5 04/18/2022 1057   AST 33 04/18/2022 1057   AST 22 02/12/2018 1258   ALT 24 04/18/2022 1057   ALT 19 02/12/2018 1258   ALKPHOS 83 04/18/2022 1057   BILITOT 0.4 04/18/2022 1057   BILITOT 0.5 02/12/2018 1258   BILIDIR 0.1 11/20/2018 1151   IBILI 0.3 11/20/2018 1151      Component Value Date/Time   TSH 2.440 02/21/2021 1014    Nutritional Lab Results  Component Value Date  VD25OH 54.1 04/18/2022   VD25OH 50.4 09/28/2021   VD25OH 46.4 01/10/2021    Attestations:   I, Clinical biochemist, acting as a Stage manager for [name], DO., have compiled all relevant documentation for today's office visit on behalf of Thomasene Lot, DO, while in the presence of [name], DO.  I have reviewed the above documentation for accuracy and completeness, and I agree with the above. Travis Huff, D.O.  The 21st Century Cures Act was signed into law in 2016 which includes the topic of electronic health records.  This provides immediate access to information in MyChart.  This includes consultation notes, operative notes, office notes, lab results and pathology reports.  If you have any questions about what you read please let us know at your next visit so we can discuss your concerns and take corrective action if need be.  We are right here with you

## 2022-09-18 ENCOUNTER — Encounter (HOSPITAL_BASED_OUTPATIENT_CLINIC_OR_DEPARTMENT_OTHER): Payer: Self-pay | Admitting: Orthopedic Surgery

## 2022-09-18 ENCOUNTER — Ambulatory Visit (INDEPENDENT_AMBULATORY_CARE_PROVIDER_SITE_OTHER): Payer: Commercial Managed Care - PPO | Admitting: Family Medicine

## 2022-09-18 ENCOUNTER — Other Ambulatory Visit: Payer: Self-pay

## 2022-09-22 NOTE — H&P (Signed)
Preoperative History & Physical Exam  Surgeon: Philipp Ovens, MD  Diagnosis: Arthritis of right thumb, right trigger thumb  Planned Procedure: Procedure(s) (LRB): RIGHT THUMB METACARPALPHALANGEAL JOINT FUSION (Right) RELEASE TRIGGER FINGER/A-1 PULLEY THUMB (Right)  History of Present Illness:   Patient is a 62 y.o. male with symptoms consistent with arthritis of right thumb and right trigger thumb who presents for surgical intervention. The risks, benefits and alternatives of surgical intervention were discussed and informed consent was obtained prior to surgery.  Past Medical History:  Past Medical History:  Diagnosis Date   Arrhythmia    Arthritis    shoulders, knees,  back,hips   Atrial fibrillation (HCC)    Avascular necrosis of bone of left hip (HCC)    Back pain    Balance problem    Blood clot in vein 06/2016   x 2 no bleeding disorders found after back surgery   Complication of anesthesia    ketamine hallucinations   Constipation    Coronary artery disease    Dislocated hip, left, initial encounter (HCC) 10/03/2018   Dysrhythmia 2020   a-fib   Hx of blood clots    Hypertension    Implantable loop recorder present    Joint pain    Neuromuscular disorder (HCC)    neuropathy bi lat legs knee down   Neuropathy    PAF (paroxysmal atrial fibrillation) (HCC)    Pre-diabetes    Prediabetes    Shakes    SOBOE (shortness of breath on exertion)    Swelling of both lower extremities    Viral pericarditis 20 yrs ago    Past Surgical History:  Past Surgical History:  Procedure Laterality Date   ABDOMINAL EXPOSURE N/A 06/23/2016   Procedure: ABDOMINAL EXPOSURE;  Surgeon: Larina Earthly, MD;  Location: [facility] OR;  Service: Vascular;  Laterality: N/A;   ANTERIOR LATERAL LUMBAR FUSION 4 LEVELS Right 06/23/2016   Procedure: Right  Lumbar two-three Lumbar three-four Lumbar four-five Anterolateral lumbar interbody fusion;  Surgeon: Maeola Harman, MD;  [facility] OR;  Service:  Neurosurgery;  Laterality: Right;   ANTERIOR LUMBAR FUSION N/A 06/23/2016   Procedure: Lumbar five -sacral one  Anterior lumbar interbody fusion with Dr. Tawanna Cooler Early for approach;  Surgeon: Maeola Harman, MD;  Location: [facility] OR;  Service: Neurosurgery;  Laterality: N/A;   APPLICATION OF INTRAOPERATIVE CT SCAN N/A 06/26/2016   Procedure: APPLICATION OF INTRAOPERATIVE CT SCAN;  Surgeon: Maeola Harman, MD;  Location: [facility] OR;  Service: Neurosurgery;  Laterality: N/A;   BACK SURGERY  2018   CARDIAC CATHETERIZATION     CARDIOVASCULAR STRESS TEST     Negative in May 2014   COLONOSCOPY     HERNIA REPAIR     umbilical   HIP CLOSED REDUCTION Left 10/03/2018   Procedure: CLOSED REDUCTION HIP;  Surgeon: Samson Frederic, MD;  Location: [facility] ORS;  Service: Orthopedics;  Laterality: Left;   IR IVC FILTER PLMT / S&I /IMG GUID/MOD SED  03/26/2018   IR RADIOLOGIST EVAL & MGMT  04/16/2019   JOINT REPLACEMENT     LEFT HEART CATH AND CORONARY ANGIOGRAPHY N/A 06/21/2018   Procedure: LEFT HEART CATH AND CORONARY ANGIOGRAPHY;  Surgeon: Corky Crafts, MD;  Location: [facility] INVASIVE CV LAB;  Service: Cardiovascular;  Laterality: N/A;   POSTERIOR LUMBAR FUSION 4 LEVEL N/A 06/26/2016   Procedure: Thoracic ten to Pelvis fixation with AIRO;  Surgeon: Maeola Harman, MD;  Location: [facility] OR;  Service: Neurosurgery;  Laterality: N/A;   REVERSE SHOULDER  ARTHROPLASTY Right 09/18/2019   Procedure: REVERSE SHOULDER ARTHROPLASTY;  Surgeon: Francena Hanly, MD;  Location: [facility] ORS;  Service: Orthopedics;  Laterality: Right;    REVERSE SHOULDER ARTHROPLASTY Left 06/10/2020   Procedure: REVERSE SHOULDER ARTHROPLASTY;  Surgeon: Francena Hanly, MD;  Location: [facility] ORS;  Service: Orthopedics;  Laterality: Left;    TENDON REPAIR     right hand   TOTAL HIP ARTHROPLASTY Right 05/25/2014   Procedure: RIGHT TOTAL HIP ARTHROPLASTY ANTERIOR APPROACH;  Surgeon: Samson Frederic, MD;  Location: [facility] OR;  Service: Orthopedics;  Laterality: Right;   TOTAL  HIP ARTHROPLASTY Left 03/27/2018   Procedure: TOTAL HIP ARTHROPLASTY ANTERIOR APPROACH- DA complex;  Surgeon: Samson Frederic, MD;  Location: [facility] ORS;  Service: Orthopedics;  Laterality: Left;   TOTAL KNEE ARTHROPLASTY Bilateral    TOTAL SHOULDER REVISION Left 10/14/2020   Procedure: Revision Left reverse shoulder arthroplasty;  Surgeon: Francena Hanly, MD;  Location: [facility] ORS;  Service: Orthopedics;  Laterality: Left;     Medications:  Prior to Admission medications   Medication Sig Start Date End Date Taking? Authorizing Provider  acetaminophen (TYLENOL) 500 MG tablet Take 1,000 mg by mouth every 6 (six) hours as needed for mild pain or headache.   Yes [provider]  apixaban (ELIQUIS) 5 MG TABS tablet Take 1 tablet (5 mg total) by mouth 2 (two) times daily. 07/05/22 07/05/23 Yes Camnitz, Will Daphine Deutscher, MD  atorvastatin (LIPITOR) 80 MG tablet Take 1 tablet (80 mg total) by mouth daily at 6pm. 05/25/22  Yes Camnitz, Will Daphine Deutscher, MD  Cyanocobalamin (B-12) 500 MCG TABS once wkly of B12 05/31/22  Yes Opalski, Gavin Pound, DO  cyclobenzaprine (FLEXERIL) 10 MG tablet Take 1 tablet (10 mg total) by mouth at bedtime. 07/07/22  Yes Monica Becton, MD  diltiazem (CARTIA XT) 120 MG 24 hr capsule Take 1 capsule (120 mg total) by mouth daily. 05/11/22  Yes Cleaver, Thomasene Ripple, NP  lisinopril-hydrochlorothiazide (ZESTORETIC) 20-12.5 MG tablet Take 2 tablets by mouth daily. 07/07/22  Yes Camnitz, Andree Coss, MD  metoprolol succinate (TOPROL-XL) 50 MG 24 hr tablet Take 1 tablet (50 mg total) by mouth daily. Take with or immediately following a meal. 01/10/22  Yes Camnitz, Will Daphine Deutscher, MD  Vitamin D, Cholecalciferol, 25 MCG (1000 UT) TABS 1000 IU qd 05/31/22  Yes Opalski, Deborah, DO  zolpidem (AMBIEN) 10 MG tablet Take 1 tablet (10 mg total) by mouth at bedtime as needed for sleep. 07/25/22 01/21/23 Yes Monica Becton, MD  docusate sodium (COLACE) 100 MG capsule Take 1 capsule (100 mg total) by  mouth 2 (two) times daily. 03/28/18   Swinteck, Arlys John, MD  dronedarone (MULTAQ) 400 MG tablet Take 1 tablet (400 mg total) by mouth 2 (two) times daily with a meal. 09/11/22   Camnitz, Andree Coss, MD  influenza vac split quadrivalent PF (FLUARIX QUADRIVALENT) 0.5 ML injection Inject into the muscle. 01/04/22   Judyann Munson, MD  sildenafil (VIAGRA) 100 MG tablet Take 1 tablet (100 mg total) by mouth daily as needed. Avoid use with nitroglycerin 07/26/21   Monica Becton, MD    Allergies:  Patient has no known allergies.  Review of Systems: Negative except per HPI.  Physical Exam: Alert and oriented, NAD Head and neck: no masses, normal alignment CV: pulse intact Pulm: no increased work of breathing, respirations even and unlabored Abdomen: non-distended Extremities: extremities warm and well perfused  LABS: Recent Results (from the past 2160 hour(s))  CUP PACEART REMOTE DEVICE CHECK  Status: None   Collection Time: 07/02/22 11:06 PM  Result Value Ref Range   Date Time Interrogation Session [id number]    Pulse Generator Manufacturer MERM    Pulse Gen Model LNQ22 LINQ II    Pulse Gen Serial Number [id number]    Clinic Name [facility]    Implantable Pulse Generator Type ICM/ILR    Implantable Pulse Generator Implant Date [id number]   CUP PACEART REMOTE DEVICE CHECK     Status: None   Collection Time: 08/02/22 11:09 PM  Result Value Ref Range   Date Time Interrogation Session [id number]    Pulse Generator Manufacturer MERM    Pulse Gen Model LNQ22 LINQ II    Pulse Gen Serial Number [id number]    Clinic Name [facility]    Implantable Pulse Generator Type ICM/ILR    Implantable Pulse Generator Implant Date [id number]   CUP PACEART REMOTE DEVICE CHECK     Status: None   Collection Time: 09/02/22 11:04 PM  Result Value Ref Range   Date Time Interrogation Session [id number]    Pulse Generator Manufacturer MERM    Pulse Gen Model LNQ22 LINQ II    Pulse Gen  Serial Number [id number]    Clinic Name [facility]    Implantable Pulse Generator Type ICM/ILR    Implantable Pulse Generator Implant Date [id number]    Eval Rhythm SR at 60 bpm/PVCs      Complete History and Physical exam available in the office notes  Helen Winterhalter F Danford Tat

## 2022-09-25 ENCOUNTER — Ambulatory Visit: Payer: Commercial Managed Care - PPO

## 2022-09-25 ENCOUNTER — Encounter (HOSPITAL_BASED_OUTPATIENT_CLINIC_OR_DEPARTMENT_OTHER)
Admission: RE | Admit: 2022-09-25 | Discharge: 2022-09-25 | Disposition: A | Discharge status: Home / Self Care | Payer: Commercial Managed Care - PPO | Source: Ambulatory Visit | Attending: Orthopedic Surgery | Admitting: Orthopedic Surgery

## 2022-09-25 DIAGNOSIS — M19041 Primary osteoarthritis, right hand: Secondary | ICD-10-CM | POA: Diagnosis not present

## 2022-09-25 DIAGNOSIS — Z87891 Personal history of nicotine dependence: Secondary | ICD-10-CM | POA: Diagnosis not present

## 2022-09-25 DIAGNOSIS — I10 Essential (primary) hypertension: Secondary | ICD-10-CM | POA: Diagnosis not present

## 2022-09-25 DIAGNOSIS — M65311 Trigger thumb, right thumb: Secondary | ICD-10-CM | POA: Diagnosis not present

## 2022-09-25 DIAGNOSIS — Z7901 Long term (current) use of anticoagulants: Secondary | ICD-10-CM | POA: Diagnosis not present

## 2022-09-25 DIAGNOSIS — M25341 Other instability, right hand: Secondary | ICD-10-CM | POA: Diagnosis not present

## 2022-09-25 DIAGNOSIS — Z01812 Encounter for preprocedural laboratory examination: Secondary | ICD-10-CM | POA: Insufficient documentation

## 2022-09-25 DIAGNOSIS — Z79899 Other long term (current) drug therapy: Secondary | ICD-10-CM | POA: Diagnosis not present

## 2022-09-25 DIAGNOSIS — I480 Paroxysmal atrial fibrillation: Secondary | ICD-10-CM | POA: Diagnosis not present

## 2022-09-25 DIAGNOSIS — I2510 Atherosclerotic heart disease of native coronary artery without angina pectoris: Secondary | ICD-10-CM | POA: Diagnosis not present

## 2022-09-25 LAB — BASIC METABOLIC PANEL
Anion gap: 10 (ref 5–15)
BUN: 18 mg/dL (ref 8–23)
CO2: 25 mmol/L (ref 22–32)
Calcium: 8.6 mg/dL — ABNORMAL LOW (ref 8.9–10.3)
Chloride: 101 mmol/L (ref 98–111)
Creatinine, Ser: 0.74 mg/dL (ref 0.61–1.24)
GFR, Estimated: >60 mL/min (ref >60)
Glucose, Bld: 93 mg/dL (ref 70–99)
Potassium: 4.5 mmol/L (ref 3.5–5.1)
Sodium: 136 mmol/L (ref 135–145)

## 2022-09-25 NOTE — Progress Notes (Signed)

## 2022-09-26 ENCOUNTER — Ambulatory Visit (HOSPITAL_BASED_OUTPATIENT_CLINIC_OR_DEPARTMENT_OTHER): Payer: Self-pay | Admitting: Anesthesiology

## 2022-09-26 ENCOUNTER — Encounter (HOSPITAL_BASED_OUTPATIENT_CLINIC_OR_DEPARTMENT_OTHER)
Admission: RE | Disposition: A | Discharge status: Home / Self Care | Payer: Self-pay | Source: Home / Self Care | Attending: Orthopedic Surgery

## 2022-09-26 ENCOUNTER — Ambulatory Visit (HOSPITAL_BASED_OUTPATIENT_CLINIC_OR_DEPARTMENT_OTHER)
Admission: RE | Admit: 2022-09-26 | Discharge: 2022-09-26 | Disposition: A | Discharge status: Home / Self Care | Payer: Commercial Managed Care - PPO | Attending: Orthopedic Surgery | Admitting: Orthopedic Surgery

## 2022-09-26 ENCOUNTER — Other Ambulatory Visit: Payer: Self-pay

## 2022-09-26 ENCOUNTER — Other Ambulatory Visit (HOSPITAL_BASED_OUTPATIENT_CLINIC_OR_DEPARTMENT_OTHER): Payer: Self-pay

## 2022-09-26 ENCOUNTER — Ambulatory Visit (HOSPITAL_BASED_OUTPATIENT_CLINIC_OR_DEPARTMENT_OTHER): Payer: Commercial Managed Care - PPO | Admitting: Anesthesiology

## 2022-09-26 ENCOUNTER — Ambulatory Visit (HOSPITAL_BASED_OUTPATIENT_CLINIC_OR_DEPARTMENT_OTHER): Payer: Commercial Managed Care - PPO

## 2022-09-26 ENCOUNTER — Encounter (HOSPITAL_BASED_OUTPATIENT_CLINIC_OR_DEPARTMENT_OTHER): Payer: Self-pay | Admitting: Orthopedic Surgery

## 2022-09-26 DIAGNOSIS — M1811 Unilateral primary osteoarthritis of first carpometacarpal joint, right hand: Secondary | ICD-10-CM | POA: Diagnosis not present

## 2022-09-26 DIAGNOSIS — I2510 Atherosclerotic heart disease of native coronary artery without angina pectoris: Secondary | ICD-10-CM

## 2022-09-26 DIAGNOSIS — M65311 Trigger thumb, right thumb: Secondary | ICD-10-CM | POA: Insufficient documentation

## 2022-09-26 DIAGNOSIS — I4891 Unspecified atrial fibrillation: Secondary | ICD-10-CM | POA: Diagnosis not present

## 2022-09-26 DIAGNOSIS — I480 Paroxysmal atrial fibrillation: Secondary | ICD-10-CM | POA: Diagnosis not present

## 2022-09-26 DIAGNOSIS — Z7901 Long term (current) use of anticoagulants: Secondary | ICD-10-CM | POA: Diagnosis not present

## 2022-09-26 DIAGNOSIS — M1812 Unilateral primary osteoarthritis of first carpometacarpal joint, left hand: Secondary | ICD-10-CM | POA: Diagnosis not present

## 2022-09-26 DIAGNOSIS — I10 Essential (primary) hypertension: Secondary | ICD-10-CM | POA: Diagnosis not present

## 2022-09-26 DIAGNOSIS — Z79899 Other long term (current) drug therapy: Secondary | ICD-10-CM | POA: Diagnosis not present

## 2022-09-26 DIAGNOSIS — M25341 Other instability, right hand: Secondary | ICD-10-CM | POA: Insufficient documentation

## 2022-09-26 DIAGNOSIS — M13841 Other specified arthritis, right hand: Secondary | ICD-10-CM | POA: Diagnosis not present

## 2022-09-26 DIAGNOSIS — Z01818 Encounter for other preprocedural examination: Secondary | ICD-10-CM

## 2022-09-26 DIAGNOSIS — Z87891 Personal history of nicotine dependence: Secondary | ICD-10-CM | POA: Insufficient documentation

## 2022-09-26 DIAGNOSIS — M19041 Primary osteoarthritis, right hand: Secondary | ICD-10-CM | POA: Insufficient documentation

## 2022-09-26 DIAGNOSIS — G8918 Other acute postprocedural pain: Secondary | ICD-10-CM | POA: Diagnosis not present

## 2022-09-26 HISTORY — PX: CARPOMETACARPAL (CMC) FUSION OF THUMB: SHX6290

## 2022-09-26 HISTORY — PX: TRIGGER FINGER RELEASE: SHX641

## 2022-09-26 SURGERY — CARPOMETACARPAL (CMC) FUSION OF THUMB
Anesthesia: Regional | Site: Thumb | Laterality: Right

## 2022-09-26 MED ORDER — PROPOFOL 10 MG/ML IV BOLUS
INTRAVENOUS | Status: AC
  Filled 2022-09-26: qty 20

## 2022-09-26 MED ORDER — LIDOCAINE 2% (20 MG/ML) 5 ML SYRINGE
INTRAMUSCULAR | Status: AC
  Filled 2022-09-26: qty 5

## 2022-09-26 MED ORDER — FENTANYL CITRATE (PF) 100 MCG/2ML IJ SOLN: 25.0000 ug | INTRAMUSCULAR | Status: DC | PRN

## 2022-09-26 MED ORDER — OXYCODONE HCL 5 MG/5ML PO SOLN: 5.0000 mg | Freq: Once | ORAL | Status: DC | PRN

## 2022-09-26 MED ORDER — BUPIVACAINE HCL (PF) 0.5 % IJ SOLN
INTRAMUSCULAR | Status: AC
  Filled 2022-09-26: qty 30

## 2022-09-26 MED ORDER — BACITRACIN ZINC 500 UNIT/GM EX OINT
TOPICAL_OINTMENT | CUTANEOUS | Status: AC
  Filled 2022-09-26: qty 28.35

## 2022-09-26 MED ORDER — CEFAZOLIN SODIUM-DEXTROSE 2-4 GM/100ML-% IV SOLN
INTRAVENOUS | Status: AC
  Filled 2022-09-26: qty 100

## 2022-09-26 MED ORDER — ONDANSETRON HCL 4 MG/2ML IJ SOLN
INTRAMUSCULAR | Status: AC
  Filled 2022-09-26: qty 2

## 2022-09-26 MED ORDER — ONDANSETRON HCL 4 MG/2ML IJ SOLN
INTRAMUSCULAR | Status: DC | PRN
  Administered 2022-09-26: 4 mg via INTRAVENOUS

## 2022-09-26 MED ORDER — EPHEDRINE SULFATE (PRESSORS) 50 MG/ML IJ SOLN
INTRAMUSCULAR | Status: DC | PRN
  Administered 2022-09-26 (×2): 10 mg via INTRAVENOUS

## 2022-09-26 MED ORDER — MIDAZOLAM HCL 5 MG/5ML IJ SOLN
INTRAMUSCULAR | Status: DC | PRN
  Administered 2022-09-26: 2 mg via INTRAVENOUS

## 2022-09-26 MED ORDER — FENTANYL CITRATE (PF) 100 MCG/2ML IJ SOLN
INTRAMUSCULAR | Status: AC
  Filled 2022-09-26: qty 2

## 2022-09-26 MED ORDER — OXYCODONE HCL 5 MG PO TABS: 5.0000 mg | ORAL_TABLET | Freq: Once | ORAL | Status: DC | PRN

## 2022-09-26 MED ORDER — EPHEDRINE 5 MG/ML INJ
INTRAVENOUS | Status: AC
  Filled 2022-09-26: qty 5

## 2022-09-26 MED ORDER — AMISULPRIDE (ANTIEMETIC) 5 MG/2ML IV SOLN: 10.0000 mg | Freq: Once | INTRAVENOUS | Status: DC | PRN

## 2022-09-26 MED ORDER — PROMETHAZINE HCL 25 MG/ML IJ SOLN: 6.2500 mg | INTRAMUSCULAR | Status: DC | PRN

## 2022-09-26 MED ORDER — BACITRACIN ZINC 500 UNIT/GM EX OINT
TOPICAL_OINTMENT | CUTANEOUS | Status: DC | PRN
  Administered 2022-09-26: 1 via TOPICAL

## 2022-09-26 MED ORDER — PROPOFOL 500 MG/50ML IV EMUL
INTRAVENOUS | Status: DC | PRN
  Administered 2022-09-26: 100 ug/kg/min via INTRAVENOUS

## 2022-09-26 MED ORDER — PROPOFOL 500 MG/50ML IV EMUL
INTRAVENOUS | Status: AC
  Filled 2022-09-26: qty 50

## 2022-09-26 MED ORDER — MIDAZOLAM HCL 2 MG/2ML IJ SOLN
INTRAMUSCULAR | Status: AC
  Filled 2022-09-26: qty 2

## 2022-09-26 MED ORDER — LIDOCAINE HCL (CARDIAC) PF 100 MG/5ML IV SOSY
PREFILLED_SYRINGE | INTRAVENOUS | Status: DC | PRN
  Administered 2022-09-26: 60 mg via INTRAVENOUS

## 2022-09-26 MED ORDER — KETOROLAC TROMETHAMINE 30 MG/ML IJ SOLN: 30.0000 mg | Freq: Once | INTRAMUSCULAR | Status: DC | PRN

## 2022-09-26 MED ORDER — ROPIVACAINE HCL 5 MG/ML IJ SOLN
INTRAMUSCULAR | Status: DC | PRN
Start: 2022-09-26 — End: 2022-09-26
  Administered 2022-09-26: 30 mL via PERINEURAL

## 2022-09-26 MED ORDER — 0.9 % SODIUM CHLORIDE (POUR BTL) OPTIME
TOPICAL | Status: DC | PRN
  Administered 2022-09-26: 200 mL

## 2022-09-26 MED ORDER — ACETAMINOPHEN 10 MG/ML IV SOLN: 1000.0000 mg | Freq: Once | INTRAVENOUS | Status: DC | PRN

## 2022-09-26 MED ORDER — OXYCODONE HCL 5 MG PO TABS
5.0000 mg | ORAL_TABLET | Freq: Four times a day (QID) | ORAL | 0 refills | Status: DC | PRN
Start: 2022-09-26 — End: 2022-09-26
  Filled 2022-09-26: qty 20, 5d supply, fill #0

## 2022-09-26 MED ORDER — OXYCODONE HCL 5 MG PO TABS
5.0000 mg | ORAL_TABLET | Freq: Four times a day (QID) | ORAL | 0 refills | Status: DC | PRN
Start: 2022-09-26 — End: 2022-11-06

## 2022-09-26 MED ORDER — LACTATED RINGERS IV SOLN: INTRAVENOUS | Status: DC

## 2022-09-26 MED ORDER — LIDOCAINE HCL (PF) 1 % IJ SOLN
INTRAMUSCULAR | Status: AC
  Filled 2022-09-26: qty 30

## 2022-09-26 MED ORDER — MIDAZOLAM HCL 2 MG/2ML IJ SOLN
2.0000 mg | Freq: Once | INTRAMUSCULAR | Status: AC
  Administered 2022-09-26: 2 mg via INTRAVENOUS

## 2022-09-26 MED ORDER — CEFAZOLIN SODIUM-DEXTROSE 2-4 GM/100ML-% IV SOLN
2.0000 g | INTRAVENOUS | Status: AC
  Administered 2022-09-26: 2 g via INTRAVENOUS

## 2022-09-26 SURGICAL SUPPLY — 53 items
BIT DRILL LONG 1.5 ZI (BIT) IMPLANT
BLADE AVERAGE 25X9 (BLADE) IMPLANT
BLADE SURG 15 STRL LF DISP TIS (BLADE) ×2 IMPLANT
BLADE SURG 15 STRL SS (BLADE) ×2
BNDG CMPR 5X4 KNIT ELC UNQ LF (GAUZE/BANDAGES/DRESSINGS) ×2
BNDG CMPR 9X4 STRL LF SNTH (GAUZE/BANDAGES/DRESSINGS) ×2
BNDG ELASTIC 4INX 5YD STR LF (GAUZE/BANDAGES/DRESSINGS) ×2 IMPLANT
BNDG ESMARK 4X9 LF (GAUZE/BANDAGES/DRESSINGS) ×2 IMPLANT
CORD BIPOLAR FORCEPS 12FT (ELECTRODE) ×2 IMPLANT
COVER BACK TABLE 60X90IN (DRAPES) ×2 IMPLANT
CUFF TOURN SGL QUICK 18X4 (TOURNIQUET CUFF) ×2 IMPLANT
DRAPE EXTREMITY T 121X128X90 (DISPOSABLE) ×2 IMPLANT
DRAPE OEC MINIVIEW 54X84 (DRAPES) ×2 IMPLANT
DRSG EMULSION OIL 3X3 NADH (GAUZE/BANDAGES/DRESSINGS) ×2 IMPLANT
GAUZE SPONGE 4X4 12PLY STRL (GAUZE/BANDAGES/DRESSINGS) ×2 IMPLANT
GLOVE BIO SURGEON STRL SZ 6 (GLOVE) ×2 IMPLANT
GLOVE BIO SURGEON STRL SZ7.5 (GLOVE) ×2 IMPLANT
GLOVE BIOGEL PI IND STRL 6.5 (GLOVE) ×2 IMPLANT
GLOVE BIOGEL PI IND STRL 7.5 (GLOVE) ×2 IMPLANT
GOWN STRL REUS W/ TWL LRG LVL3 (GOWN DISPOSABLE) ×2 IMPLANT
GOWN STRL REUS W/TWL LRG LVL3 (GOWN DISPOSABLE) ×2
GOWN STRL REUS W/TWL XL LVL3 (GOWN DISPOSABLE) ×2 IMPLANT
K-WIRE ALPS MXV 1.1X6 ZI (WIRE) ×2
KWIRE ALPS MXV 1.1X6 ZI (WIRE) IMPLANT
NDL HYPO 22X1.5 SAFETY MO (MISCELLANEOUS) ×2 IMPLANT
NEEDLE HYPO 22X1.5 SAFETY MO (MISCELLANEOUS)
NS IRRIG 1000ML POUR BTL (IV SOLUTION) ×2 IMPLANT
PACK BASIN DAY SURGERY FS (CUSTOM PROCEDURE TRAY) ×2 IMPLANT
PAD CAST 4YDX4 CTTN HI CHSV (CAST SUPPLIES) ×2 IMPLANT
PADDING CAST ABS COTTON 4X4 ST (CAST SUPPLIES) ×2 IMPLANT
PADDING CAST COTTON 4X4 STRL (CAST SUPPLIES) ×2
PADDING CAST SYNTHETIC 4X4 STR (CAST SUPPLIES) ×2 IMPLANT
PLATE STRT FLAT 1.5X2 10H2 10H (Plate) IMPLANT
SCREW LOCK MDS 2.710 (Screw) IMPLANT
SCREW LOCK MDS 2.7X12 (Screw) IMPLANT
SCREW NL 2.7X12 (Screw) IMPLANT
SCREW NLOCK 2.7X12 (Screw) ×4 IMPLANT
SCREW NLOCK MVX 2.7X16 (Screw) IMPLANT
SHEET MEDIUM DRAPE 40X70 STRL (DRAPES) ×2 IMPLANT
SLING ARM FOAM STRAP LRG (SOFTGOODS) IMPLANT
SPLINT FIBERGLASS 3X35 (CAST SUPPLIES) IMPLANT
SPLINT FIBERGLASS 4X30 (CAST SUPPLIES) IMPLANT
SUT ORTHOCORD W/MULTIPK NDL (SUTURE) IMPLANT
SUT VIC AB 3-0 SH 27 (SUTURE)
SUT VIC AB 3-0 SH 27X BRD (SUTURE) IMPLANT
SUT VIC AB 4-0 PS2 18 (SUTURE) ×2 IMPLANT
SUT VICRYL RAPIDE 4/0 PS 2 (SUTURE) ×4 IMPLANT
SYR 10ML LL (SYRINGE) ×2 IMPLANT
SYR BULB EAR ULCER 3OZ GRN STR (SYRINGE) ×2 IMPLANT
TAPE SURG TRANSPORE 1 IN (GAUZE/BANDAGES/DRESSINGS) ×2 IMPLANT
TOWEL GREEN STERILE FF (TOWEL DISPOSABLE) ×4 IMPLANT
TRAY DSU PREP LF (CUSTOM PROCEDURE TRAY) ×2 IMPLANT
UNDERPAD 30X36 HEAVY ABSORB (UNDERPADS AND DIAPERS) ×2 IMPLANT

## 2022-09-26 NOTE — Transfer of Care (Signed)
Immediate Anesthesia Transfer of Care Note  Patient: Travis Huff  Procedure(s) Performed: Procedure(s) (LRB): RIGHT THUMB METACARPALPHALANGEAL JOINT FUSION (Right) RELEASE TRIGGER FINGER/A-1 PULLEY THUMB (Right)  Patient Location: PACU  Anesthesia Type: MAC  Level of Consciousness: awake, sedated, patient cooperative and responds to stimulation  Airway & Oxygen Therapy: Patient Spontanous Breathing and Patient connected to [place] oxygen  Post-op Assessment: Report given to PACU RN, Post -op Vital signs reviewed and stable and Patient moving all extremities  Post vital signs: Reviewed and stable  Complications: No apparent anesthesia complications

## 2022-09-26 NOTE — Progress Notes (Signed)
Assisted Dr. Bradley Ferris with right, supraclavicular, ultrasound guided block. Side rails up, monitors on throughout procedure. See vital signs in flow sheet. Tolerated Procedure well.

## 2022-09-26 NOTE — Anesthesia Postprocedure Evaluation (Signed)
Anesthesia Post Note  Patient: Travis Huff  Procedure(s) Performed: RIGHT THUMB METACARPALPHALANGEAL JOINT FUSION (Right: Thumb) RELEASE TRIGGER FINGER/A-1 PULLEY THUMB (Right: Finger)     Patient location during evaluation: PACU Anesthesia Type: Regional Level of consciousness: awake Pain management: pain level controlled Vital Signs Assessment: post-procedure vital signs reviewed and stable Respiratory status: spontaneous breathing, nonlabored ventilation and respiratory function stable Cardiovascular status: blood pressure returned to baseline and stable Postop Assessment: no apparent nausea or vomiting Anesthetic complications: no   No notable events documented.  Last Vitals:  Vitals:   09/26/22 0930 09/26/22 0941  BP: 103/75 112/72  Pulse: 60 (!) 59  Resp: 15 12  Temp: (!) 36.3 C (!) 36.3 C  SpO2: 94% 93%    Last Pain:  Vitals:   09/26/22 0941  TempSrc:   PainSc: 0-No pain                 Amaan Meyer P Addisson Frate

## 2022-09-26 NOTE — Discharge Instructions (Addendum)
Orthopaedic Hand Surgery Discharge Instructions  WEIGHT BEARING STATUS: Non weight bearing on operative extremity  DRESSING CARE: Please keep your dressing/splint/cast clean and dry until your follow-up appointment. You may shower by placing a waterproof covering over your dressing/splint/cast. Contact your surgeon if your splint/cast gets wet. It will need to be changed to prevent skin breakdown.  PAIN CONTROL: First line medications for post operative pain control are Tylenol (acetaminophen) and Motrin (ibuprofen) if you are able to take these medications. If you have been prescribed a medication these can be taken as breakthrough pain medications. Please note that some narcotic pain medication has acetaminophen added and you should never consume more than 4,000mg  of acetaminophen in 24-hour period. Please note that if you are given Toradol (ketorolac) you should not take similar medications such as ibuprofen or naproxen.  DISCHARGE MEDICATIONS: If you have been prescribed medication it was sent electronically to your pharmacy. No changes have been made to your home medications.  ICE/ELEVATION: Ice and elevate your injured extremity as needed. Avoid direct contact of ice with skin.   BANDAGE FEELS TOO TIGHT: If your bandage feels too tight, first make sure you are elevating your fingers as much as possible. The outer layer of the bandage can be unwrapped and reapplied more loosely. If no improvement, you may carefully cut the inner layer longitudinally until the pressure has resolved and then rewrap the outer layer. If you are not comfortable with these instructions, please call the office and the bandage can be changed for you.   FOLLOW UP: You will be called after surgery with an appointment date and time, however if you have not received a phone call within 3 days, please call during regular office hours at [phone] to schedule a post operative appointment.  Please Seek Medical Attention  if: Call MD for: pain or pressure in chest, jaw, arm, back, neck  Call MD for: temperature greater than 101 F for more than 24 hrs Call MD for: difficulty breathing Call MD for: incision redness, bleeding, drainage  Call MD for: palpitations or feeling that the heart is racing  Call MD for: increased swelling in arm, leg, ankle, or abdomen  Call MD for: lightheadedness, dizziness, fainting Call 911 or go to ER for any medical emergency if you are not able to get in touch with your doctor   J. Standley Dakins, MD Orthopaedic Hand Surgeon [facility] Office number: [phone] [address], [address] [place], [place] [address]    Post Anesthesia Home Care Instructions  Activity: Get plenty of rest for the remainder of the day. A responsible individual must stay with you for 24 hours following the procedure.  For the next 24 hours, DO NOT: -Drive a car -Advertising copywriter -Drink alcoholic beverages -Take any medication unless instructed by your physician -Make any legal decisions or sign important papers.  Meals: Start with liquid foods such as gelatin or soup. Progress to regular foods as tolerated. Avoid greasy, spicy, heavy foods. If nausea and/or vomiting occur, drink only clear liquids until the nausea and/or vomiting subsides. Call your physician if vomiting continues.  Special Instructions/Symptoms: Your throat may feel dry or sore from the anesthesia or the breathing tube placed in your throat during surgery. If this causes discomfort, gargle with warm salt water. The discomfort should disappear within 24 hours.  If you had a scopolamine patch placed behind your ear for the management of post- operative nausea and/or vomiting:  1. The medication in the patch is effective for  72 hours, after which it should be removed.  Wrap patch in a tissue and discard in the trash. Wash hands thoroughly with soap and water. 2. You may remove the patch earlier than 72 hours if you experience  unpleasant side effects which may include dry mouth, dizziness or visual disturbances. 3. Avoid touching the patch. Wash your hands with soap and water after contact with the patch.   Regional Anesthesia Blocks  1. You may not be able to move or feel the "blocked" extremity after a regional anesthetic block. This may last may last from 3-48 hours after placement, but it will go away. The length of time depends on the medication injected and your individual response to the medication. As the nerves start to wake up, you may experience tingling as the movement and feeling returns to your extremity. If the numbness and inability to move your extremity has not gone away after 48 hours, please call your surgeon.   2. The extremity that is blocked will need to be protected until the numbness is gone and the strength has returned. Because you cannot feel it, you will need to take extra care to avoid injury. Because it may be weak, you may have difficulty moving it or using it. You may not know what position it is in without looking at it while the block is in effect.  3. For blocks in the legs and feet, returning to weight bearing and walking needs to be done carefully. You will need to wait until the numbness is entirely gone and the strength has returned. You should be able to move your leg and foot normally before you try and bear weight or walk. You will need someone to be with you when you first try to ensure you do not fall and possibly risk injury.  4. Bruising and tenderness at the needle site are common side effects and will resolve in a few days.  5. Persistent numbness or new problems with movement should be communicated to the surgeon or the [facility] [phone] [facility] [phone]).

## 2022-09-26 NOTE — Op Note (Signed)
OPERATIVE NOTE  DATE OF PROCEDURE: 09/26/2022  SURGEONS:  Primary: Gomez Cleverly, MD  PREOPERATIVE DIAGNOSIS: Right thumb MCP joint instability and arthritis, Right trigger thumb  POSTOPERATIVE DIAGNOSIS: Same  NAME OF PROCEDURE:   Right thumb MCP joint fusion Right trigger thumb A1 pulley release Four view radiographs of right thumb with intraoperative interpretation  ANESTHESIA: Monitor Anesthesia Care + Block  SKIN PREPARATION: Hibiclens  ESTIMATED BLOOD LOSS: Minimal  IMPLANTS:  Implant Name Type Inv. Item Serial No. Manufacturer Lot No. LRB No. Used Action  PLATE STRT FLAT 1.5X2 10H2 10H - [id number] Plate PLATE STRT FLAT 1.5X2 10H2 10H  ZIMMER RECON(ORTH,TRAU,BIO,SG) STERILIZED IN TRAY Right 1 Implanted  BIOMET ZIMMER VPC SCREWS 2,0 NL SCREW X 12     STERILIZED IN TRAY Right 1 Implanted  BIOMET ZIMMER VPC SCREWS 2.0 X 16 NL SCREW     STERILIZED IN TRAY Right 1 Implanted  ZIMMER BIOMET VPC SCREWS 2.0 LOCKING SCREW X 10     STERILIZED IN TRAY Right 1 Implanted  ZIMMER BIOMET LOCKING SCREW VPC SCREWS 2.0 X 12     STERILIZED IN TRAY Right 1 Implanted    INDICATIONS:  Travis Huff is a 62 y.o. male who has the above preoperative diagnosis. The patient has decided to proceed with surgical intervention.  Risks, benefits and alternatives of operative management were discussed including, but not limited to, risks of anesthesia complications, infection, pain, persistent symptoms, stiffness, need for future surgery.  The patient understands, agrees and elects to proceed with surgery.    DESCRIPTION OF PROCEDURE: The patient was met in the pre-operative area and their identity was verified.  The operative location and laterality was also verified and marked.  The patient was brought to the OR and was placed supine on the table.  After repeat patient identification with the operative team anesthesia was provided and the patient was prepped and draped in the usual sterile fashion.  A final timeout  was performed verifying the correction patient, procedure, location and laterality.  Preoperative antibiotics were provided and then after sterile prep and drape the right upper extremity was elevated exsanguinated with an Esmarch and tourniquet inflated to 250 mmHg.  A transverse incision was made over the A1 pulley of the right thumb.  Skin subcutaneous tissues were divided and careful hemostasis was obtained.  The digital nerve branches were identified and protected and the A1 pulley was released the FPL tendon was completely freed up with no underlying adhesions.  There was a small correction of the IP joint contracture however this still remained a significant amount of flexion.  At this point a longitudinal incision was made over the dorsal aspect of the right thumb.  Dissection between the extensor pollicis longus and brevis was performed and capsulotomy was performed.  The collateral ligaments were recessed from the right thumb MCP joint proximal aspect and a rongeurs and curettes were utilized to debride the cartilage from the joint surfaces.  A K wire was utilized to drill holes into bleeding cancellous bone and then the bone graft was saved from the cancellous portion of the debridement.  The wound was thoroughly irrigated and then was reduced.  In order to avoid too much flexion of the thumb into the palm due to IP joint contracture I selected an essentially neutral with a slight pronation position of the right thumb MCP joint fusion.  This was reduced and held in place with a K wire and then a 5 hole plate was selected this was drilled  proximally distally with dynamic compression and before final tightening the cancellous bone graft was placed within the fusion site at the right thumb MCP joint.  4 view radiographs of the right thumb were obtained with intraoperative interpretation confirming adequate length alignment rotation and placement of the hardware of the right thumb MCP joint fusion.  At this  time closure was performed with 4-0 Vicryl suture approximating the capsule and tendons creating a smooth gliding surface for the EPL tendon.  The skin was closed in routine fashion with absorbable suture for both the volar and dorsal incisions.  At this time sterile soft bandage was applied followed by a thumb spica splint.  Tourniquet was deflated and all digits were pink and warm and well-perfused with brisk capillary fill to the tips of all digits.  All counts were correct x 2.  The patient was awoken from anesthesia and brought to PACU for recovery in stable condition.  Travis Ovens, MD

## 2022-09-26 NOTE — Anesthesia Procedure Notes (Signed)
Anesthesia Regional Block: Supraclavicular block   Pre-Anesthetic Checklist: , timeout performed,  Correct Patient, Correct Site, Correct Laterality,  Correct Procedure, Correct Position, site marked,  Risks and benefits discussed,  Surgical consent,  Pre-op evaluation,  At surgeon's request and post-op pain management  Laterality: Right  Prep: chloraprep       Needles:  Injection technique: Single-shot  Needle Type: Echogenic Stimulator Needle     Needle Length: 9cm  Needle Gauge: 21     Additional Needles:   Procedures:,,,, ultrasound used (permanent image in chart),,    Narrative:  Start time: 09/26/2022 6:50 AM End time: 09/26/2022 7:00 AM Injection made incrementally with aspirations every 5 mL.  Performed by: Personally  Anesthesiologist: Leonides Grills, MD  Additional Notes: Functioning IV was confirmed and monitors were applied.  A timeout was performed. Sterile prep, hand hygiene and sterile gloves were used. A 90mm 21ga Arrow echogenic stimulator needle was used. Negative aspiration and negative test dose prior to incremental administration of local anesthetic. The patient tolerated the procedure well.  Ultrasound guidance: relevent anatomy identified, needle position confirmed, local anesthetic spread visualized around nerve(s), vascular puncture avoided.  Image printed for medical record.

## 2022-09-26 NOTE — Anesthesia Preprocedure Evaluation (Addendum)
Anesthesia Evaluation  Patient identified by MRN, date of birth, ID band Patient awake    Reviewed: Allergy & Precautions, NPO status , Patient's Chart, lab work & pertinent test results  Airway Mallampati: I       Dental  (+) Missing   Pulmonary former smoker   Pulmonary exam normal        Cardiovascular hypertension, Pt. on home beta blockers and Pt. on medications + CAD and + Past MI  Normal cardiovascular exam+ dysrhythmias Atrial Fibrillation      Neuro/Psych  PSYCHIATRIC DISORDERS Anxiety      Neuromuscular disease    GI/Hepatic negative GI ROS,,,(+)     substance abuse    Endo/Other  negative endocrine ROS    Renal/GU negative Renal ROS     Musculoskeletal  (+) Arthritis ,    Abdominal   Peds  Hematology  (+) Blood dyscrasia (Eliquis)   Anesthesia Other Findings Arthritis of hand  Reproductive/Obstetrics                             Anesthesia Physical Anesthesia Plan  ASA: 3  Anesthesia Plan: Regional   Post-op Pain Management:    Induction:   PONV Risk Score and Plan: 0 and Ondansetron, Dexamethasone, Propofol infusion, Midazolam and Treatment may vary due to age or medical condition  Airway Management Planned: Simple Face Mask  Additional Equipment:   Intra-op Plan:   Post-operative Plan:   Informed Consent: I have reviewed the patients History and Physical, chart, labs and discussed the procedure including the risks, benefits and alternatives for the proposed anesthesia with the patient or authorized representative who has indicated his/her understanding and acceptance.     Dental advisory given  Plan Discussed with: CRNA  Anesthesia Plan Comments:        Anesthesia Quick Evaluation

## 2022-09-26 NOTE — Anesthesia Procedure Notes (Signed)
Procedure Name: MAC Date/Time: 09/26/2022 7:44 AM  Performed by: Jessica Priest, CRNAPre-anesthesia Checklist: Timeout performed, Patient being monitored, Suction available, Emergency Drugs available and Patient identified Patient Re-evaluated:Patient Re-evaluated prior to induction Oxygen Delivery Method: Simple face mask Preoxygenation: Pre-oxygenation with 100% oxygen Induction Type: IV induction Placement Confirmation: breath sounds checked- equal and bilateral, CO2 detector and positive ETCO2

## 2022-09-26 NOTE — Interval H&P Note (Signed)
History and Physical Interval Note:  09/26/2022 7:38 AM  Travis Huff  has presented today for surgery, with the diagnosis of Arthritis of hand.  The various methods of treatment have been discussed with the patient and family. After consideration of risks, benefits and other options for treatment, the patient has consented to  Procedure(s) with comments: RIGHT THUMB METACARPALPHALANGEAL JOINT FUSION (Right) - BLOCK AND MAC 90 MIN RELEASE TRIGGER FINGER/A-1 PULLEY THUMB (Right) as a surgical intervention.  The patient's history has been reviewed, patient examined, no change in status, stable for surgery.  I have reviewed the patient's chart and labs.  Questions were answered to the patient's satisfaction.     Gomez Cleverly

## 2022-09-27 ENCOUNTER — Encounter (HOSPITAL_BASED_OUTPATIENT_CLINIC_OR_DEPARTMENT_OTHER): Payer: Self-pay | Admitting: Orthopedic Surgery

## 2022-09-28 NOTE — Progress Notes (Signed)
[facility] Summary Report / Loop Recorder 

## 2022-10-04 LAB — CUP PACEART REMOTE DEVICE CHECK
Date Time Interrogation Session: 20241001230252
Implantable Pulse Generator Implant Date: 20220727

## 2022-10-05 ENCOUNTER — Other Ambulatory Visit (HOSPITAL_BASED_OUTPATIENT_CLINIC_OR_DEPARTMENT_OTHER): Payer: Self-pay

## 2022-10-05 MED ORDER — INFLUENZA VIRUS VACC SPLIT PF (FLUZONE) 0.5 ML IM SUSY
0.5000 mL | PREFILLED_SYRINGE | Freq: Once | INTRAMUSCULAR | 0 refills | Status: AC
  Filled 2022-10-05: qty 0.5, 1d supply, fill #0

## 2022-10-09 ENCOUNTER — Ambulatory Visit: Payer: Commercial Managed Care - PPO

## 2022-10-11 DIAGNOSIS — M13841 Other specified arthritis, right hand: Secondary | ICD-10-CM | POA: Diagnosis not present

## 2022-10-16 ENCOUNTER — Ambulatory Visit: Payer: Commercial Managed Care - PPO

## 2022-10-16 ENCOUNTER — Ambulatory Visit (INDEPENDENT_AMBULATORY_CARE_PROVIDER_SITE_OTHER): Payer: Commercial Managed Care - PPO

## 2022-10-16 DIAGNOSIS — I480 Paroxysmal atrial fibrillation: Secondary | ICD-10-CM

## 2022-10-23 ENCOUNTER — Other Ambulatory Visit: Payer: Self-pay

## 2022-10-25 DIAGNOSIS — M13841 Other specified arthritis, right hand: Secondary | ICD-10-CM | POA: Diagnosis not present

## 2022-10-26 ENCOUNTER — Encounter: Payer: Self-pay | Admitting: Sports Medicine

## 2022-10-30 ENCOUNTER — Ambulatory Visit: Payer: Commercial Managed Care - PPO

## 2022-11-01 NOTE — Progress Notes (Signed)
[facility] Summary Report / Loop Recorder 

## 2022-11-06 ENCOUNTER — Ambulatory Visit (INDEPENDENT_AMBULATORY_CARE_PROVIDER_SITE_OTHER): Payer: Commercial Managed Care - PPO | Admitting: Family Medicine

## 2022-11-06 ENCOUNTER — Encounter (INDEPENDENT_AMBULATORY_CARE_PROVIDER_SITE_OTHER): Payer: Self-pay | Admitting: Family Medicine

## 2022-11-06 VITALS — BP 117/78 | HR 73 | Temp 98.1°F | Ht 71.0 in | Wt 200.0 lb

## 2022-11-06 DIAGNOSIS — I10 Essential (primary) hypertension: Secondary | ICD-10-CM

## 2022-11-06 DIAGNOSIS — Z6827 Body mass index (BMI) 27.0-27.9, adult: Secondary | ICD-10-CM | POA: Diagnosis not present

## 2022-11-06 DIAGNOSIS — E538 Deficiency of other specified B group vitamins: Secondary | ICD-10-CM

## 2022-11-06 DIAGNOSIS — E559 Vitamin D deficiency, unspecified: Secondary | ICD-10-CM

## 2022-11-06 DIAGNOSIS — R7303 Prediabetes: Secondary | ICD-10-CM | POA: Diagnosis not present

## 2022-11-06 DIAGNOSIS — Z6828 Body mass index (BMI) 28.0-28.9, adult: Secondary | ICD-10-CM

## 2022-11-06 LAB — CUP PACEART REMOTE DEVICE CHECK
Date Time Interrogation Session: 20241101231939
Implantable Pulse Generator Implant Date: 20220727

## 2022-11-06 NOTE — Progress Notes (Signed)
[name], D.O.  ABFM, ABOM Specializing in Clinical Bariatric Medicine  Office located at: [address] [place], [place]  [address]     Assessment and Plan:  Review labs with pt next OV Orders Placed This Encounter  Procedures   Comprehensive metabolic panel   Hemoglobin A1c   VITAMIN D 25 Hydroxy (Vit-D Deficiency, Fractures)   Vitamin B12   Magnesium   Medications Discontinued During This Encounter  Medication Reason   oxyCODONE (ROXICODONE) 5 MG immediate release tablet    Essential hypertension Assessment: Condition is Controlled.[name] Pt BP today is under well control. This is being controlled with  Cartia XT 120mg  daily, Zestorectic 20-12.5mg  BID, and Toprol-XL 50mg  daily. He is asymptomatic and denies any light-headiness or dizziness.  Last 3 blood pressure readings in our office are as follows: BP Readings from Last 3 Encounters:  11/06/22 117/78  09/26/22 112/72  09/11/22 127/83    Plan: - Continue with Nancie Neas XT, Zestorectic, and Toprol-XL daily.   - We will check CMP and magnesium level due to muscle twitching/spasm.   - Beckie Busing BP is 117/78 at goal today.   - Reminded patient that if they ever feel poorly in any way, to check their blood pressure and pulse as well.  - We will continue to monitor closely alongside PCP/ specialists.  Pt reminded to also f/up with those individuals as instructed by them as they relate to the his weight loss journey.   Pre-diabetes Assessment: Condition is Not optimized.. This is diet/exercise controlled. He endorses his hunger and cravings are well controlled. Although he did eat off plan occasionally usually in social settings.  Lab Results  Component Value Date   HGBA1C 5.4 04/18/2022   HGBA1C 5.1 09/28/2021   HGBA1C 5.4 01/10/2021   INSULIN 6.9 04/18/2022   INSULIN 5.8 09/28/2021   INSULIN 10.3 02/21/2021    Plan: - Kinan will continue to work on weight loss, exercise, via their meal plan we devised  to help decrease the risk of progressing to diabetes.   - Explained role of simple carbs and insulin levels on hunger and cravings  - Anticipatory guidance given.    - We will recheck A1c and fasting insulin level today   Vitamin D deficiency Assessment: Condition is Controlled.. This is being controlled with OTC vitamin D3 oral supplements. He denies any adverse side effects especially effects bone pain/health.  Lab Results  Component Value Date   VD25OH 54.1 04/18/2022   VD25OH 50.4 09/28/2021   VD25OH 46.4 01/10/2021   Plan:  - Continue with OTC cholecalciferol 1000 lU as directed.  - Informed patient this may be a lifelong thing, and he was encouraged to continue to take the medicine until told otherwise.     - weight loss will likely improve availability of vitamin D, thus encouraged Dantonio to continue with meal plan and their weight loss efforts to further improve this condition.    - With winter approaching his vitamin D levels are prone to drop so we will need to monitor levels regularly to keep levels within normal limits and prevent over supplementation.   TREATMENT PLAN FOR OBESITY: BMI 28.0-28.9,adult-current bmi 27.91 Morbid obesity (HCC)-starting bmi 48.89/DATE 02/03/20 Assessment:  KASHIUS DOMINIC is here to discuss his progress with his obesity treatment plan along with follow-up of his obesity related diagnoses. See Medical Weight Management Flowsheet for complete bioelectrical impedance results.  Condition is improving but not optimized. Biometric data collected today, was reviewed  with patient.   Since last office visit on 09/092024 patient's  Muscle mass has increased by 1.4lb. Fat mass has increased by 2lb. Total body water has decreased by 0.8lb.  Counseling done on how various foods will affect these numbers and how to maximize success  Total lbs lost to date: 150 Total weight loss percentage to date: 42.86%  Plan: - Lovelle will work on following the Category  4 meal plan  Behavioral Intervention Additional resources provided today: patient declined Evidence-based interventions for health behavior change were utilized today including the discussion of self monitoring techniques, problem-solving barriers and SMART goal setting techniques.   Regarding patient's less desirable eating habits and patterns, we employed the technique of small changes.  Pt will specifically work on: Continue to follow the meal plan for next visit.    He has agreed to Continue current level of physical activity  and Increase the intensity, frequency or duration of aerobic exercises     FOLLOW UP: Return in about 14 months (around 01/15/2024).  He was informed of the importance of frequent follow up visits to maximize his success with intensive lifestyle modifications for his multiple health conditions. GRANITE GODMAN is aware that we will review all of his lab results at our next visit together in person.  He is aware that if anything is critical/ life threatening with the results, we will be contacting him via MyChart or by my CMA will be calling them prior to the office visit to discuss acute management.    Subjective:   Chief complaint: Obesity Hermen is here to discuss his progress with his obesity treatment plan. He is on the the Category 4 Plan and states he is following his eating plan approximately 80% of the time. He states he is walking 45 minutes 4 days per week.  Interval History:  KAILAND SEDA is here for a follow up office visit.     Since last office visit:  He informed me that he had not walked in a month and just started back last week. This was due to surgery on his thumb that had to be corrected, he endorses having more pain in his right hand and wrists since surgery. He noticed since starting back walking this felt harder to do than usual. He did eat off plan some as he had a Halloween and ate some sweets, desserts, and of plan foods. He previously  wanted to stay within 195lb but now would like to be down around 190lb. He typically eats cooked veggies and lean meat for dinner.   We reviewed his meal plan and all questions were answered.   Review of Systems:  Pertinent positives were addressed with patient today.  Reviewed by clinician on day of visit: allergies, medications, problem list, medical history, surgical history, family history, social history, and previous encounter notes.  Weight Summary and Biometrics   Weight Lost Since Last Visit: 0lb  Weight Gained Since Last Visit: 3lb   Vitals Temp: 98.1 F (36.7 C) BP: 117/78 Pulse Rate: 73 SpO2: 99 %   Anthropometric Measurements Height: 5\' 11"  (1.803 m) Weight: 200 lb (90.7 kg) BMI (Calculated): 27.91 Weight at Last Visit: 197lb Weight Lost Since Last Visit: 0lb Weight Gained Since Last Visit: 3lb Starting Weight: 350lb Total Weight Loss (lbs): 150 lb (68 kg) Peak Weight: 350lb   Body Composition  Body Fat %: 34.6 % Fat Mass (lbs): 69.4 lbs Muscle Mass (lbs): 124.6 lbs Total Body Water (lbs): 96.4 lbs Visceral  Fat Rating : 17   Other Clinical Data Fasting: yes Labs: yes Today's Visit #: 36 Starting Date: 02/03/20   Objective:   PHYSICAL EXAM: Blood pressure 117/78, pulse 73, temperature 98.1 F (36.7 C), height 5\' 11"  (1.803 m), weight 200 lb (90.7 kg), SpO2 99%. Body mass index is 27.89 kg/m.  General: Well Developed, well nourished, and in no acute distress.  HEENT: Normocephalic, atraumatic Skin: Warm and dry, cap RF less 2 sec, good turgor Chest:  Normal excursion, shape, no gross abn Respiratory: speaking in full sentences, no conversational dyspnea NeuroM-Sk: Ambulates w/o assistance, moves * 4 Psych: A and O *3, insight good, mood-full  DIAGNOSTIC DATA REVIEWED:  BMET    Component Value Date/Time   NA 136 09/25/2022 1017   NA 139 04/18/2022 1057   K 4.5 09/25/2022 1017   CL 101 09/25/2022 1017   CO2 25 09/25/2022 1017    GLUCOSE 93 09/25/2022 1017   BUN 18 09/25/2022 1017   BUN 26 04/18/2022 1057   CREATININE 0.74 09/25/2022 1017   CREATININE 0.97 06/19/2018 1109   CALCIUM 8.6 (L) 09/25/2022 1017   GFRNONAA >60 09/25/2022 1017   GFRNONAA 86 06/19/2018 1109   GFRAA 104 02/03/2020 1334   GFRAA 100 06/19/2018 1109   Lab Results  Component Value Date   HGBA1C 5.4 04/18/2022   HGBA1C  12/14/2009    5.5 (NOTE)                                                                       According to the ADA Clinical Practice Recommendations for 2011, when HbA1c is used as a screening test:   >=6.5%   Diagnostic of Diabetes Mellitus           (if abnormal result  is confirmed)  5.7-6.4%   Increased risk of developing Diabetes Mellitus  References:Diagnosis and Classification of Diabetes Mellitus,Diabetes Care,2011,34(Suppl 1):S62-S69 and Standards of Medical Care in         Diabetes - 2011,Diabetes Care,2011,34  (Suppl 1):S11-S61.   Lab Results  Component Value Date   INSULIN 6.9 04/18/2022   INSULIN 17.2 02/03/2020   Lab Results  Component Value Date   TSH 2.440 02/21/2021   CBC    Component Value Date/Time   WBC 8.1 06/19/2022 1439   WBC 8.9 10/13/2020 0911   RBC 5.00 06/19/2022 1439   RBC 5.04 10/13/2020 0911   HGB 15.7 06/19/2022 1439   HCT 48.5 06/19/2022 1439   PLT 213 06/19/2022 1439   MCV 97 06/19/2022 1439   MCH 31.4 06/19/2022 1439   MCH 32.1 10/13/2020 0911   MCHC 32.4 06/19/2022 1439   MCHC 33.6 10/13/2020 0911   RDW 12.7 06/19/2022 1439   Iron Studies No results found for: "IRON", "TIBC", "FERRITIN", "IRONPCTSAT" Lipid Panel     Component Value Date/Time   CHOL 146 04/18/2022 1057   TRIG 54 04/18/2022 1057   HDL 80 04/18/2022 1057   CHOLHDL 1.8 04/18/2022 1057   CHOLHDL 2.8 11/20/2018 1147   VLDL 15 05/23/2012 0610   LDLCALC 54 04/18/2022 1057   LDLCALC 76 11/20/2018 1147   Hepatic Function Panel     Component Value Date/Time   PROT 6.6 04/18/2022 1057   ALBUMIN 4.5  04/18/2022 1057   AST 33 04/18/2022 1057   AST 22 02/12/2018 1258   ALT 24 04/18/2022 1057   ALT 19 02/12/2018 1258   ALKPHOS 83 04/18/2022 1057   BILITOT 0.4 04/18/2022 1057   BILITOT 0.5 02/12/2018 1258   BILIDIR 0.1 11/20/2018 1151   IBILI 0.3 11/20/2018 1151      Component Value Date/Time   TSH 2.440 02/21/2021 1014   Nutritional Lab Results  Component Value Date   VD25OH 54.1 04/18/2022   VD25OH 50.4 09/28/2021   VD25OH 46.4 01/10/2021    Attestations:   I, Clinical biochemist, acting as a Stage manager for [name], DO., have compiled all relevant documentation for today's office visit on behalf of Thomasene Lot, DO, while in the presence of [name], DO.  I have reviewed the above documentation for accuracy and completeness, and I agree with the above. [name], D.O.  The 21st Century Cures Act was signed into law in 2016 which includes the topic of electronic health records.  This provides immediate access to information in MyChart.  This includes consultation notes, operative notes, office notes, lab results and pathology reports.  If you have any questions about what you read please let us know at your next visit so we can discuss your concerns and take corrective action if need be.  We are right here with you.

## 2022-11-07 LAB — COMPREHENSIVE METABOLIC PANEL
ALT: 20 [IU]/L (ref 0–44)
AST: 33 [IU]/L (ref 0–40)
Albumin: 4.4 g/dL (ref 3.9–4.9)
Alkaline Phosphatase: 77 [IU]/L (ref 44–121)
BUN/Creatinine Ratio: 22 (ref 10–24)
BUN: 17 mg/dL (ref 8–27)
Bilirubin Total: 0.6 mg/dL (ref 0.0–1.2)
CO2: 23 mmol/L (ref 20–29)
Calcium: 9.6 mg/dL (ref 8.6–10.2)
Chloride: 100 mmol/L (ref 96–106)
Creatinine, Ser: 0.77 mg/dL (ref 0.76–1.27)
Globulin, Total: 2.3 g/dL (ref 1.5–4.5)
Glucose: 106 mg/dL — ABNORMAL HIGH (ref 70–99)
Potassium: 4.6 mmol/L (ref 3.5–5.2)
Sodium: 141 mmol/L (ref 134–144)
Total Protein: 6.7 g/dL (ref 6.0–8.5)
eGFR: 102 mL/min/{1.73_m2} (ref >59)

## 2022-11-07 LAB — HEMOGLOBIN A1C
Est. average glucose Bld gHb Est-mCnc: 117 mg/dL
Hgb A1c MFr Bld: 5.7 % — ABNORMAL HIGH (ref 4.8–5.6)

## 2022-11-07 LAB — MAGNESIUM: Magnesium: 2.1 mg/dL (ref 1.6–2.3)

## 2022-11-07 LAB — VITAMIN D 25 HYDROXY (VIT D DEFICIENCY, FRACTURES): Vit D, 25-Hydroxy: 58.1 ng/mL (ref 30.0–100.0)

## 2022-11-07 LAB — VITAMIN B12: Vitamin B-12: 721 pg/mL (ref 232–1245)

## 2022-11-13 ENCOUNTER — Ambulatory Visit: Payer: Commercial Managed Care - PPO

## 2022-11-20 ENCOUNTER — Ambulatory Visit (INDEPENDENT_AMBULATORY_CARE_PROVIDER_SITE_OTHER): Payer: Commercial Managed Care - PPO

## 2022-11-20 ENCOUNTER — Ambulatory Visit: Payer: Commercial Managed Care - PPO

## 2022-11-20 DIAGNOSIS — I480 Paroxysmal atrial fibrillation: Secondary | ICD-10-CM | POA: Diagnosis not present

## 2022-12-04 ENCOUNTER — Ambulatory Visit (INDEPENDENT_AMBULATORY_CARE_PROVIDER_SITE_OTHER): Payer: Commercial Managed Care - PPO

## 2022-12-04 ENCOUNTER — Ambulatory Visit: Payer: Commercial Managed Care - PPO

## 2022-12-04 DIAGNOSIS — I255 Ischemic cardiomyopathy: Secondary | ICD-10-CM

## 2022-12-06 DIAGNOSIS — M13841 Other specified arthritis, right hand: Secondary | ICD-10-CM | POA: Diagnosis not present

## 2022-12-06 DIAGNOSIS — M65311 Trigger thumb, right thumb: Secondary | ICD-10-CM | POA: Diagnosis not present

## 2022-12-07 LAB — CUP PACEART REMOTE DEVICE CHECK
Date Time Interrogation Session: 20241202230415
Implantable Pulse Generator Implant Date: 20220727

## 2022-12-15 NOTE — Progress Notes (Signed)
[facility] Summary Report / Loop Recorder 

## 2022-12-18 ENCOUNTER — Ambulatory Visit: Payer: Commercial Managed Care - PPO

## 2022-12-25 ENCOUNTER — Ambulatory Visit: Payer: Commercial Managed Care - PPO

## 2022-12-25 DIAGNOSIS — I255 Ischemic cardiomyopathy: Secondary | ICD-10-CM

## 2023-01-05 DIAGNOSIS — L723 Sebaceous cyst: Secondary | ICD-10-CM | POA: Diagnosis not present

## 2023-01-05 DIAGNOSIS — N5201 Erectile dysfunction due to arterial insufficiency: Secondary | ICD-10-CM | POA: Diagnosis not present

## 2023-01-08 ENCOUNTER — Ambulatory Visit (INDEPENDENT_AMBULATORY_CARE_PROVIDER_SITE_OTHER): Payer: Commercial Managed Care - PPO

## 2023-01-08 ENCOUNTER — Ambulatory Visit: Payer: Commercial Managed Care - PPO

## 2023-01-08 DIAGNOSIS — I480 Paroxysmal atrial fibrillation: Secondary | ICD-10-CM

## 2023-01-09 LAB — CUP PACEART REMOTE DEVICE CHECK
Date Time Interrogation Session: 20250105230553
Implantable Pulse Generator Implant Date: 20220727

## 2023-01-15 ENCOUNTER — Ambulatory Visit (INDEPENDENT_AMBULATORY_CARE_PROVIDER_SITE_OTHER): Payer: Commercial Managed Care - PPO | Admitting: Family Medicine

## 2023-01-15 ENCOUNTER — Other Ambulatory Visit (HOSPITAL_BASED_OUTPATIENT_CLINIC_OR_DEPARTMENT_OTHER): Payer: Self-pay

## 2023-01-21 ENCOUNTER — Other Ambulatory Visit (HOSPITAL_COMMUNITY): Payer: Self-pay | Admitting: Cardiology

## 2023-01-21 ENCOUNTER — Other Ambulatory Visit (HOSPITAL_COMMUNITY): Payer: Self-pay | Admitting: Sports Medicine

## 2023-01-21 ENCOUNTER — Other Ambulatory Visit: Payer: Self-pay | Admitting: Cardiology

## 2023-01-21 DIAGNOSIS — F5101 Primary insomnia: Secondary | ICD-10-CM

## 2023-01-22 ENCOUNTER — Ambulatory Visit: Payer: Commercial Managed Care - PPO

## 2023-01-22 ENCOUNTER — Other Ambulatory Visit (HOSPITAL_BASED_OUTPATIENT_CLINIC_OR_DEPARTMENT_OTHER): Payer: Self-pay

## 2023-01-22 MED ORDER — APIXABAN 5 MG PO TABS
5.0000 mg | ORAL_TABLET | Freq: Two times a day (BID) | ORAL | 1 refills | Status: DC
  Filled 2023-01-22: qty 180, 90d supply, fill #0
  Filled 2023-04-27: qty 180, 90d supply, fill #1

## 2023-01-22 MED ORDER — ZOLPIDEM TARTRATE 10 MG PO TABS
10.0000 mg | ORAL_TABLET | Freq: Every evening | ORAL | 1 refills | Status: DC | PRN
  Filled 2023-01-22: qty 90, 90d supply, fill #0
  Filled 2023-04-15: qty 90, 90d supply, fill #1

## 2023-01-22 NOTE — Telephone Encounter (Signed)
Prescription refill request for Eliquis received. Indication:afib Last office visit:6/24 Scr:0.77  11/24 Age: 63 Weight:90.7  kg  Prescription refilled

## 2023-01-23 ENCOUNTER — Other Ambulatory Visit (HOSPITAL_BASED_OUTPATIENT_CLINIC_OR_DEPARTMENT_OTHER): Payer: Self-pay

## 2023-01-23 MED ORDER — METOPROLOL SUCCINATE ER 50 MG PO TB24
50.0000 mg | ORAL_TABLET | Freq: Every day | ORAL | 3 refills | Status: DC
  Filled 2023-01-23: qty 90, 90d supply, fill #0
  Filled 2023-04-27: qty 90, 90d supply, fill #1
  Filled 2023-07-25: qty 90, 90d supply, fill #2

## 2023-01-26 DIAGNOSIS — Z96651 Presence of right artificial knee joint: Secondary | ICD-10-CM | POA: Diagnosis not present

## 2023-01-26 DIAGNOSIS — Z96652 Presence of left artificial knee joint: Secondary | ICD-10-CM | POA: Diagnosis not present

## 2023-01-29 ENCOUNTER — Ambulatory Visit: Payer: Commercial Managed Care - PPO

## 2023-01-29 DIAGNOSIS — I480 Paroxysmal atrial fibrillation: Secondary | ICD-10-CM

## 2023-02-11 ENCOUNTER — Other Ambulatory Visit: Payer: Self-pay | Admitting: General Practice

## 2023-02-11 ENCOUNTER — Other Ambulatory Visit: Payer: Self-pay | Admitting: Cardiology

## 2023-02-12 ENCOUNTER — Ambulatory Visit: Payer: Commercial Managed Care - PPO

## 2023-02-12 DIAGNOSIS — I480 Paroxysmal atrial fibrillation: Secondary | ICD-10-CM

## 2023-02-12 LAB — CUP PACEART REMOTE DEVICE CHECK
Date Time Interrogation Session: 20250209231025
Implantable Pulse Generator Implant Date: 20220727

## 2023-02-13 ENCOUNTER — Other Ambulatory Visit (HOSPITAL_BASED_OUTPATIENT_CLINIC_OR_DEPARTMENT_OTHER): Payer: Self-pay

## 2023-02-13 MED ORDER — LISINOPRIL-HYDROCHLOROTHIAZIDE 20-12.5 MG PO TABS
2.0000 | ORAL_TABLET | Freq: Every day | ORAL | 1 refills | Status: DC
  Filled 2023-02-13: qty 180, 90d supply, fill #0
  Filled 2023-05-11: qty 180, 90d supply, fill #1

## 2023-02-13 NOTE — Telephone Encounter (Signed)
Pt's pharmacy is requesting a refill on diltiazem. Pt does not have a primary Cardiologist. Would Dr. Elberta Fortis like to refill this medication? Please address

## 2023-02-16 ENCOUNTER — Other Ambulatory Visit (HOSPITAL_BASED_OUTPATIENT_CLINIC_OR_DEPARTMENT_OTHER): Payer: Self-pay

## 2023-02-16 MED ORDER — DILTIAZEM HCL ER COATED BEADS 120 MG PO CP24
120.0000 mg | ORAL_CAPSULE | Freq: Every day | ORAL | 1 refills | Status: DC
  Filled 2023-02-16: qty 90, 90d supply, fill #0
  Filled 2023-05-19: qty 90, 90d supply, fill #1

## 2023-02-19 NOTE — Progress Notes (Signed)
 [facility] Summary Report / Loop Recorder

## 2023-02-19 NOTE — Addendum Note (Signed)
Addended by: Geralyn Flash D on: 02/19/2023 12:35 PM   Modules accepted: Orders

## 2023-02-22 ENCOUNTER — Other Ambulatory Visit (HOSPITAL_BASED_OUTPATIENT_CLINIC_OR_DEPARTMENT_OTHER): Payer: Self-pay

## 2023-02-22 MED ORDER — AMOXICILLIN 500 MG PO CAPS
2000.0000 mg | ORAL_CAPSULE | Freq: Every day | ORAL | 0 refills | Status: DC
  Filled 2023-02-22: qty 8, 2d supply, fill #0

## 2023-02-26 ENCOUNTER — Ambulatory Visit: Payer: Commercial Managed Care - PPO

## 2023-03-04 ENCOUNTER — Other Ambulatory Visit: Payer: Self-pay | Admitting: Sports Medicine

## 2023-03-05 ENCOUNTER — Other Ambulatory Visit (HOSPITAL_BASED_OUTPATIENT_CLINIC_OR_DEPARTMENT_OTHER): Payer: Self-pay

## 2023-03-05 ENCOUNTER — Ambulatory Visit: Payer: Commercial Managed Care - PPO

## 2023-03-05 DIAGNOSIS — I480 Paroxysmal atrial fibrillation: Secondary | ICD-10-CM | POA: Diagnosis not present

## 2023-03-05 MED ORDER — SILDENAFIL CITRATE 100 MG PO TABS
100.0000 mg | ORAL_TABLET | Freq: Every day | ORAL | 0 refills | Status: AC | PRN
  Filled 2023-03-05: qty 30, 30d supply, fill #0

## 2023-03-13 ENCOUNTER — Encounter (INDEPENDENT_AMBULATORY_CARE_PROVIDER_SITE_OTHER): Payer: Self-pay

## 2023-03-19 ENCOUNTER — Ambulatory Visit (INDEPENDENT_AMBULATORY_CARE_PROVIDER_SITE_OTHER): Payer: Commercial Managed Care - PPO

## 2023-03-19 ENCOUNTER — Ambulatory Visit: Payer: Commercial Managed Care - PPO

## 2023-03-19 DIAGNOSIS — I480 Paroxysmal atrial fibrillation: Secondary | ICD-10-CM

## 2023-03-20 LAB — CUP PACEART REMOTE DEVICE CHECK
Date Time Interrogation Session: 20250316230751
Implantable Pulse Generator Implant Date: 20220727

## 2023-03-23 NOTE — Progress Notes (Signed)
 [facility] Summary Report / Loop Recorder

## 2023-03-26 DIAGNOSIS — I480 Paroxysmal atrial fibrillation: Secondary | ICD-10-CM | POA: Diagnosis not present

## 2023-04-10 ENCOUNTER — Encounter (INDEPENDENT_AMBULATORY_CARE_PROVIDER_SITE_OTHER): Payer: Self-pay | Admitting: Family Medicine

## 2023-04-12 ENCOUNTER — Encounter (INDEPENDENT_AMBULATORY_CARE_PROVIDER_SITE_OTHER): Payer: Self-pay | Admitting: Family Medicine

## 2023-04-12 ENCOUNTER — Ambulatory Visit (INDEPENDENT_AMBULATORY_CARE_PROVIDER_SITE_OTHER): Admitting: Family Medicine

## 2023-04-12 VITALS — BP 127/72 | HR 68 | Temp 97.9°F | Ht 71.0 in | Wt 196.0 lb

## 2023-04-12 DIAGNOSIS — R7303 Prediabetes: Secondary | ICD-10-CM | POA: Diagnosis not present

## 2023-04-12 DIAGNOSIS — Z6828 Body mass index (BMI) 28.0-28.9, adult: Secondary | ICD-10-CM

## 2023-04-12 DIAGNOSIS — E559 Vitamin D deficiency, unspecified: Secondary | ICD-10-CM | POA: Diagnosis not present

## 2023-04-12 DIAGNOSIS — Z6827 Body mass index (BMI) 27.0-27.9, adult: Secondary | ICD-10-CM | POA: Diagnosis not present

## 2023-04-12 DIAGNOSIS — E538 Deficiency of other specified B group vitamins: Secondary | ICD-10-CM

## 2023-04-12 MED ORDER — B-12 500 MCG PO TABS: ORAL_TABLET | ORAL | Status: DC

## 2023-04-12 MED ORDER — VITAMIN D (CHOLECALCIFEROL) 25 MCG (1000 UT) PO TABS: ORAL_TABLET | ORAL | Status: DC

## 2023-04-12 NOTE — Progress Notes (Signed)
 Travis Huff, D.O.  ABFM, ABOM Specializing in Clinical Bariatric Medicine  Office located at: [address] [place], [place]  [address]   Assessment and Plan:   Consider obtaining fasting labs next OV  Orders Placed This Encounter  Procedures   Hemoglobin A1c   Comprehensive metabolic panel with GFR    Medications Discontinued During This Encounter  Medication Reason   amoxicillin (AMOXIL) 500 MG capsule Completed Course   docusate sodium (COLACE) 100 MG capsule Completed Course   Vitamin D, Cholecalciferol, 25 MCG (1000 UT) TABS Reorder   Cyanocobalamin (B-12) 500 MCG TABS Reorder     Meds ordered this encounter  Medications   Cyanocobalamin (B-12) 500 MCG TABS    Sig: once wkly of B12   Vitamin D, Cholecalciferol, 25 MCG (1000 UT) TABS    Sig: 1000 IU qd    FOR THE DISEASE OF OBESITY:  BMI 28.0-28.9,adult-current bmi 27.35 Morbid obesity (HCC)-starting bmi 48.89/DATE 02/03/20 Assessment & Plan: Since last office visit on 11/06/2022 patient's muscle mass has decreased by 0.2 lb. Fat mass has decreased by 3.6 lb. Total body water has increased by 1.6 lb.  Counseling done on how various foods will affect these numbers and how to maximize success  Total lbs lost to date: 154 lbs  Total weight loss percentage to date: 44.00%    Recommended Dietary Goals Travis Huff is currently in the action stage of change. As such, his goal is to continue weight management plan.  He has agreed to: continue current plan   Behavioral Intervention We discussed the following today: his elevated body fat percentage, increasing lean protein intake to established goals, increasing lower glycemic fruits, and continue to practice mindfulness when eating  Additional resources provided today: Handout on low glycemic and low calorie fruits and veggies and Handout on Common Characteristics of Successful Weight Losers,  Evidence-based interventions for health behavior change were  utilized today including the discussion of self monitoring techniques, problem-solving barriers and SMART goal setting techniques.   Regarding patient's less desirable eating habits and patterns, we employed the technique of small changes.   Pt will specifically work on: n/a   Recommended Physical Activity Goals Travis Huff has been advised to work up to 150 minutes of moderate intensity aerobic activity a week and strengthening exercises 2-3 times per week for cardiovascular health, weight loss maintenance and preservation of muscle mass.   He has agreed to : consider starting some form of strength training with low weights   Pharmacotherapy We both agreed to : continue with nutritional and behavioral strategies   FOR ASSOCIATED CONDITIONS ADDRESSED TODAY:  Pre-diabetes Assessment & Plan: Lab Results  Component Value Date   HGBA1C 5.7 (H) 11/06/2022   HGBA1C 5.4 04/18/2022   HGBA1C 5.1 09/28/2021   INSULIN 6.9 04/18/2022   INSULIN 5.8 09/28/2021   INSULIN 10.3 02/21/2021    No medications - diet/lifestyle approach. His Hemoglobin A1c has worsened from 5.4 to 5.7 - states he was eating "off-plan" for a period prior to these labs. He has been eating on-plan since early January or so. I again explained role of simple carbs and insulin levels on hunger and cravings. Continue balanced diet focusing on protein, fruits, and vegetables while limiting simple carbohydrates. Losing 10% or more of body weight can improve condition.    Vitamin D deficiency Assessment & Plan: Lab Results  Component Value Date   VD25OH 58.1 11/06/2022   VD25OH 54.1 04/18/2022   VD25OH 50.4 09/28/2021  Most recent Vitamin D above. Reports compliance with OTC Cholecalciferol 1,000 units daily. Continue supplementation - recheck future.    B12 deficiency Assessment & Plan: Lab Results  Component Value Date   VITAMINB12 721 11/06/2022   Most recent B12 above. Reports compliance with OTC Cyanocobalamin 2500  mcg once wkly. His B12 levels are at goal. Continue supplementation - recheck future.    Follow up:   Return in about 2 months (around 06/12/2023). He was informed of the importance of frequent follow up visits to maximize his success with intensive lifestyle modifications for his multiple health conditions.  Travis Huff is aware that we will review all of his lab results at our next visit.  He is aware that if anything is critical/ life threatening with the results, we will be contacting him via MyChart prior to the office visit to discuss management.    Subjective:   Chief complaint: Obesity Travis Huff is here to discuss his progress with his obesity treatment plan. He is on  the Category 4 Plan and states he is following his eating plan approximately 85% of the time. He states he is walking 45-60 minutes 4-5 days per week.  Interval History:  Travis Huff is here for a follow up office visit. Since last OV on 11/06/2022, Travis Huff is down 4 lbs. He has been tracking his weight on his phone over the past 2 years and is overall pleased with the results. States that he deviated from his meal plan from late October - early January. After this period, he has been eating essentially everything on plan the majority of the time; however he has swapped the apples for bananas. His hunger/cravings are controlled.   Pharmacotherapy for weight loss: He is currently taking no anti-obesity medication.   Review of Systems:  Pertinent positives were addressed with patient today.  Reviewed by clinician on day of visit: allergies, medications, problem list, medical history, surgical history, family history, social history, and previous encounter notes.  Weight Summary and Biometrics   Weight Lost Since Last Visit: 4 lb  Weight Gained Since Last Visit: 0   Vitals Temp: 97.9 F (36.6 C) BP: 127/72 Pulse Rate: 68 SpO2: 98 %   Anthropometric Measurements Height: 5\' 11"  (1.803 m) Weight: 196 lb  (88.9 kg) BMI (Calculated): 27.35 Weight at Last Visit: 200 lb Weight Lost Since Last Visit: 4 lb Weight Gained Since Last Visit: 0 Starting Weight: 350 lb Total Weight Loss (lbs): 154 lb (69.9 kg)   Body Composition  Body Fat %: 33.4 % Fat Mass (lbs): 65.8 lbs Muscle Mass (lbs): 124.4 lbs Total Body Water (lbs): 98 lbs Visceral Fat Rating : 16   Other Clinical Data Fasting: No Labs: No Today's Visit #: 37 Starting Date: 02/03/20   Objective:   PHYSICAL EXAM: Blood pressure 127/72, pulse 68, temperature 97.9 F (36.6 C), height 5\' 11"  (1.803 m), weight 196 lb (88.9 kg), SpO2 98%. Body mass index is 27.34 kg/m.  General: he is overweight, cooperative and in no acute distress. PSYCH: Has normal mood, affect and thought process.   HEENT: EOMI, sclerae are anicteric. Lungs: Normal breathing effort, no conversational dyspnea. Extremities: Moves * 4 Neurologic: A and O * 3, good insight  DIAGNOSTIC DATA REVIEWED: BMET    Component Value Date/Time   NA 141 11/06/2022 1050   K 4.6 11/06/2022 1050   CL 100 11/06/2022 1050   CO2 23 11/06/2022 1050   GLUCOSE 106 (H) 11/06/2022 1050   GLUCOSE 93  09/25/2022 1017   BUN 17 11/06/2022 1050   CREATININE 0.77 11/06/2022 1050   CREATININE 0.97 06/19/2018 1109   CALCIUM 9.6 11/06/2022 1050   GFRNONAA >60 09/25/2022 1017   GFRNONAA 86 06/19/2018 1109   GFRAA 104 02/03/2020 1334   GFRAA 100 06/19/2018 1109   Lab Results  Component Value Date   HGBA1C 5.7 (H) 11/06/2022   HGBA1C  12/14/2009    5.5 (NOTE)                                                                       According to the ADA Clinical Practice Recommendations for 2011, when HbA1c is used as a screening test:   >=6.5%   Diagnostic of Diabetes Mellitus           (if abnormal result  is confirmed)  5.7-6.4%   Increased risk of developing Diabetes Mellitus  References:Diagnosis and Classification of Diabetes Mellitus,Diabetes Care,2011,34(Suppl 1):S62-S69 and  Standards of Medical Care in         Diabetes - 2011,Diabetes Care,2011,34  (Suppl 1):S11-S61.   Lab Results  Component Value Date   INSULIN 6.9 04/18/2022   INSULIN 17.2 02/03/2020   Lab Results  Component Value Date   TSH 2.440 02/21/2021   CBC    Component Value Date/Time   WBC 8.1 06/19/2022 1439   WBC 8.9 10/13/2020 0911   RBC 5.00 06/19/2022 1439   RBC 5.04 10/13/2020 0911   HGB 15.7 06/19/2022 1439   HCT 48.5 06/19/2022 1439   PLT 213 06/19/2022 1439   MCV 97 06/19/2022 1439   MCH 31.4 06/19/2022 1439   MCH 32.1 10/13/2020 0911   MCHC 32.4 06/19/2022 1439   MCHC 33.6 10/13/2020 0911   RDW 12.7 06/19/2022 1439   Iron Studies No results found for: "IRON", "TIBC", "FERRITIN", "IRONPCTSAT" Lipid Panel     Component Value Date/Time   CHOL 146 04/18/2022 1057   TRIG 54 04/18/2022 1057   HDL 80 04/18/2022 1057   CHOLHDL 1.8 04/18/2022 1057   CHOLHDL 2.8 11/20/2018 1147   VLDL 15 05/23/2012 0610   LDLCALC 54 04/18/2022 1057   LDLCALC 76 11/20/2018 1147   Hepatic Function Panel     Component Value Date/Time   PROT 6.7 11/06/2022 1050   ALBUMIN 4.4 11/06/2022 1050   AST 33 11/06/2022 1050   AST 22 02/12/2018 1258   ALT 20 11/06/2022 1050   ALT 19 02/12/2018 1258   ALKPHOS 77 11/06/2022 1050   BILITOT 0.6 11/06/2022 1050   BILITOT 0.5 02/12/2018 1258   BILIDIR 0.1 11/20/2018 1151   IBILI 0.3 11/20/2018 1151      Component Value Date/Time   TSH 2.440 02/21/2021 1014   Nutritional Lab Results  Component Value Date   VD25OH 58.1 11/06/2022   VD25OH 54.1 04/18/2022   VD25OH 50.4 09/28/2021    Attestations:   I, Travis Huff, acting as a Stage manager for [employer], DO., have compiled all relevant documentation for today's office visit on behalf of Travis Lot, DO, while in the presence of [employer], DO.  Reviewed by clinician on day of visit: allergies, medications, problem list, medical history, surgical history, family history,  social history, and previous encounter notes pertinent to patient's obesity  diagnosis.  I have spent 40 minutes in the care of the patient today including any pre and post-care. 28 minutes was specifically focused on direct face to face patient counseling of how to maintain his weight loss, improve his muscle strength, and increasing resistance training to improve his visceral fat and body fat percentage. This was all in addition to the above counseling and documentation.   I have reviewed the above documentation for accuracy and completeness, and I agree with the above. Travis Huff, D.O.  The 21st Century Cures Act was signed into law in 2016 which includes the topic of electronic health records.  This provides immediate access to information in MyChart.  This includes consultation notes, operative notes, office notes, lab results and pathology reports.  If you have any questions about what you read please let us know at your next visit so we can discuss your concerns and take corrective action if need be.  We are right here with you.

## 2023-04-13 LAB — COMPREHENSIVE METABOLIC PANEL WITH GFR
ALT: 30 IU/L (ref 0–44)
AST: 45 IU/L — ABNORMAL HIGH (ref 0–40)
Albumin: 4.4 g/dL (ref 3.9–4.9)
Alkaline Phosphatase: 89 IU/L (ref 44–121)
BUN/Creatinine Ratio: 35 — ABNORMAL HIGH (ref 10–24)
BUN: 28 mg/dL — ABNORMAL HIGH (ref 8–27)
Bilirubin Total: 0.5 mg/dL (ref 0.0–1.2)
CO2: 22 mmol/L (ref 20–29)
Calcium: 9.8 mg/dL (ref 8.6–10.2)
Chloride: 99 mmol/L (ref 96–106)
Creatinine, Ser: 0.81 mg/dL (ref 0.76–1.27)
Globulin, Total: 2.2 g/dL (ref 1.5–4.5)
Glucose: 90 mg/dL (ref 70–99)
Potassium: 4.6 mmol/L (ref 3.5–5.2)
Sodium: 139 mmol/L (ref 134–144)
Total Protein: 6.6 g/dL (ref 6.0–8.5)
eGFR: 100 mL/min/{1.73_m2} (ref >59)

## 2023-04-13 LAB — HEMOGLOBIN A1C
Est. average glucose Bld gHb Est-mCnc: 111 mg/dL
Hgb A1c MFr Bld: 5.5 % (ref 4.8–5.6)

## 2023-04-23 ENCOUNTER — Ambulatory Visit: Payer: Commercial Managed Care - PPO

## 2023-04-23 DIAGNOSIS — I480 Paroxysmal atrial fibrillation: Secondary | ICD-10-CM

## 2023-04-24 LAB — CUP PACEART REMOTE DEVICE CHECK
Date Time Interrogation Session: 20250420230810
Implantable Pulse Generator Implant Date: 20220727

## 2023-05-07 NOTE — Progress Notes (Signed)
 [facility] Summary Report / Loop Recorder

## 2023-05-07 NOTE — Addendum Note (Signed)
 Addended by: Edra Govern D on: 05/07/2023 03:33 PM   Modules accepted: Orders

## 2023-05-10 ENCOUNTER — Telehealth: Payer: Self-pay | Admitting: Cardiology

## 2023-05-10 NOTE — Telephone Encounter (Signed)
 Pt is going to consider ILR explant and will let us  know.  Aware ok to leave in without harm, so he would like to think about it.  He appreciates my follow up and will call back if he decides he would like to have explanted.

## 2023-05-10 NOTE — Telephone Encounter (Signed)
 Pt requesting cb to discuss when/if his loop recorder can come out since its been about 4 yrs now

## 2023-05-21 ENCOUNTER — Ambulatory Visit

## 2023-05-21 ENCOUNTER — Other Ambulatory Visit (HOSPITAL_BASED_OUTPATIENT_CLINIC_OR_DEPARTMENT_OTHER): Payer: Self-pay

## 2023-05-21 ENCOUNTER — Encounter (INDEPENDENT_AMBULATORY_CARE_PROVIDER_SITE_OTHER): Payer: Self-pay | Admitting: Family Medicine

## 2023-05-21 ENCOUNTER — Ambulatory Visit: Payer: Self-pay | Admitting: Sports Medicine

## 2023-05-21 ENCOUNTER — Ambulatory Visit: Admitting: Sports Medicine

## 2023-05-21 VITALS — BP 147/89 | HR 69 | Temp 98.0°F | Wt 204.0 lb

## 2023-05-21 DIAGNOSIS — Z96611 Presence of right artificial shoulder joint: Secondary | ICD-10-CM | POA: Diagnosis not present

## 2023-05-21 DIAGNOSIS — R051 Acute cough: Secondary | ICD-10-CM | POA: Diagnosis not present

## 2023-05-21 DIAGNOSIS — R0689 Other abnormalities of breathing: Secondary | ICD-10-CM | POA: Diagnosis not present

## 2023-05-21 DIAGNOSIS — Z96612 Presence of left artificial shoulder joint: Secondary | ICD-10-CM | POA: Diagnosis not present

## 2023-05-21 LAB — POCT INFLUENZA A/B
Influenza A, POC: NEGATIVE
Influenza B, POC: NEGATIVE

## 2023-05-21 LAB — POC COVID19 BINAXNOW: SARS Coronavirus 2 Ag: NEGATIVE

## 2023-05-21 MED ORDER — DOXYCYCLINE HYCLATE 100 MG PO TABS
100.0000 mg | ORAL_TABLET | Freq: Two times a day (BID) | ORAL | 0 refills | Status: AC
Start: 2023-05-21 — End: 2023-05-28
  Filled 2023-05-21: qty 14, 7d supply, fill #0

## 2023-05-21 MED ORDER — HYDROCOD POLI-CHLORPHE POLI ER 10-8 MG/5ML PO SUER
5.0000 mL | Freq: Two times a day (BID) | ORAL | 0 refills | Status: DC | PRN
Start: 2023-05-21 — End: 2023-08-27
  Filled 2023-05-21: qty 115, 12d supply, fill #0

## 2023-05-21 MED ORDER — PREDNISONE 50 MG PO TABS
50.0000 mg | ORAL_TABLET | Freq: Every day | ORAL | 0 refills | Status: AC
Start: 2023-05-21 — End: 2023-05-26
  Filled 2023-05-21: qty 5, 5d supply, fill #0

## 2023-05-21 NOTE — Addendum Note (Signed)
 Addended by: Montgomery Apgar on: 05/21/2023 02:32 PM   Modules accepted: Orders

## 2023-05-21 NOTE — Assessment & Plan Note (Signed)
 Very pleasant 63 year old male, he has multiple medical problems, 5 days of minimally productive cough, clear sputum, no blood. Mild headache. Multiple sick contacts in the household. Occasional muscle aches and bodyaches. No overt shortness of breath, no focal neurologic symptoms, no chest pain, no GI symptoms. On exam he appears well, he speaking full sentences, no nasal flaring or accessory muscle use. His only positive neurologic finding is coarse sounds throughout all lung fields. COVID and flu testing is negative here in the office. Adding 5 days of prednisone , chest x-ray, doxycycline , Tussionex for cough. Return to see me as needed for this.

## 2023-05-21 NOTE — Progress Notes (Signed)
    Procedures performed today:    None.  Independent interpretation of notes and tests performed by another provider:   None.  Brief History, Exam, Impression, and Recommendations:    Coughing Very pleasant 63 year old male, he has multiple medical problems, 5 days of minimally productive cough, clear sputum, no blood. Mild headache. Multiple sick contacts in the household. Occasional muscle aches and bodyaches. No overt shortness of breath, no focal neurologic symptoms, no chest pain, no GI symptoms. On exam he appears well, he speaking full sentences, no nasal flaring or accessory muscle use. His only positive neurologic finding is coarse sounds throughout all lung fields. COVID and flu testing is negative here in the office. Adding 5 days of prednisone , chest x-ray, doxycycline , Tussionex for cough. Return to see me as needed for this.    ____________________________________________ Joselyn Nicely. Sandy Crumb, M.D., ABFM., CAQSM., AME. Primary Care and Sports Medicine [facility]  Adjunct Professor of [facility] Medicine  [employer]  Restaurant manager, fast food

## 2023-05-29 ENCOUNTER — Ambulatory Visit: Payer: Commercial Managed Care - PPO

## 2023-05-29 DIAGNOSIS — I480 Paroxysmal atrial fibrillation: Secondary | ICD-10-CM | POA: Diagnosis not present

## 2023-05-30 ENCOUNTER — Ambulatory Visit: Payer: Self-pay | Admitting: Cardiology

## 2023-05-30 LAB — CUP PACEART REMOTE DEVICE CHECK
Date Time Interrogation Session: 20250526231110
Implantable Pulse Generator Implant Date: 20220727

## 2023-06-03 ENCOUNTER — Other Ambulatory Visit: Payer: Self-pay | Admitting: Cardiology

## 2023-06-04 ENCOUNTER — Other Ambulatory Visit (HOSPITAL_BASED_OUTPATIENT_CLINIC_OR_DEPARTMENT_OTHER): Payer: Self-pay

## 2023-06-04 MED ORDER — ATORVASTATIN CALCIUM 80 MG PO TABS
80.0000 mg | ORAL_TABLET | Freq: Every day | ORAL | 0 refills | Status: DC
  Filled 2023-06-04: qty 90, 90d supply, fill #0

## 2023-06-12 ENCOUNTER — Ambulatory Visit (INDEPENDENT_AMBULATORY_CARE_PROVIDER_SITE_OTHER): Admitting: Family Medicine

## 2023-06-13 NOTE — Progress Notes (Signed)
 [facility] Summary Report / Loop Recorder

## 2023-06-13 NOTE — Addendum Note (Signed)
 Addended by: Edra Govern D on: 06/13/2023 11:30 AM   Modules accepted: Orders

## 2023-06-14 ENCOUNTER — Encounter: Payer: Self-pay | Admitting: Sports Medicine

## 2023-06-20 ENCOUNTER — Encounter: Payer: Self-pay | Admitting: Sports Medicine

## 2023-06-20 ENCOUNTER — Other Ambulatory Visit (INDEPENDENT_AMBULATORY_CARE_PROVIDER_SITE_OTHER)

## 2023-06-20 ENCOUNTER — Ambulatory Visit (INDEPENDENT_AMBULATORY_CARE_PROVIDER_SITE_OTHER): Admitting: Sports Medicine

## 2023-06-20 VITALS — BP 127/77 | HR 56 | Resp 20 | Ht 71.0 in | Wt 203.0 lb

## 2023-06-20 DIAGNOSIS — Z0000 Encounter for general adult medical examination without abnormal findings: Secondary | ICD-10-CM

## 2023-06-20 DIAGNOSIS — M19031 Primary osteoarthritis, right wrist: Secondary | ICD-10-CM

## 2023-06-20 DIAGNOSIS — E559 Vitamin D deficiency, unspecified: Secondary | ICD-10-CM

## 2023-06-20 DIAGNOSIS — M19032 Primary osteoarthritis, left wrist: Secondary | ICD-10-CM | POA: Diagnosis not present

## 2023-06-20 DIAGNOSIS — R7303 Prediabetes: Secondary | ICD-10-CM

## 2023-06-20 DIAGNOSIS — N139 Obstructive and reflux uropathy, unspecified: Secondary | ICD-10-CM

## 2023-06-20 DIAGNOSIS — E538 Deficiency of other specified B group vitamins: Secondary | ICD-10-CM

## 2023-06-20 MED ORDER — TRIAMCINOLONE ACETONIDE 40 MG/ML IJ SUSP
80.0000 mg | Freq: Once | INTRAMUSCULAR | Status: AC
Start: 2023-06-20 — End: 2023-06-20
  Administered 2023-06-20: 80 mg via INTRAMUSCULAR

## 2023-06-20 NOTE — Progress Notes (Signed)
 Subjective:    CC: Annual Physical Exam  HPI:  This patient is here for their annual physical  I reviewed the past medical history, family history, social history, surgical history, and allergies today and no changes were needed.  Please see the problem list section below in epic for further details.  Past Medical History: Past Medical History:  Diagnosis Date   Arrhythmia    Arthritis    shoulders, knees,  back,hips   Atrial fibrillation (HCC)    Avascular necrosis of bone of left hip (HCC)    Back pain    Balance problem    Blood clot in vein 06/2016   x 2 no bleeding disorders found after back surgery   Complication of anesthesia    ketamine  hallucinations   Constipation    Coronary artery disease    Dislocated hip, left, initial encounter (HCC) 10/03/2018   Dysrhythmia 2020   a-fib   Hx of blood clots    Hypertension    Implantable loop recorder present    Joint pain    Neuromuscular disorder (HCC)    neuropathy bi lat legs knee down   Neuropathy    PAF (paroxysmal atrial fibrillation) (HCC)    Pre-diabetes    Prediabetes    Shakes    SOBOE (shortness of breath on exertion)    Swelling of both lower extremities    Viral pericarditis 20 yrs ago   Past Surgical History: Past Surgical History:  Procedure Laterality Date   ABDOMINAL EXPOSURE N/A 06/23/2016   Procedure: ABDOMINAL EXPOSURE;  Surgeon: Mayo Speck, MD;  Location: [facility] OR;  Service: Vascular;  Laterality: N/A;   ANTERIOR LATERAL LUMBAR FUSION 4 LEVELS Right 06/23/2016   Procedure: Right  Lumbar two-three Lumbar three-four Lumbar four-five Anterolateral lumbar interbody fusion;  Surgeon: Manya Sells, MD;  Location: [facility] OR;  Service: Neurosurgery;  Laterality: Right;   ANTERIOR LUMBAR FUSION N/A 06/23/2016   Procedure: Lumbar five -sacral one  Anterior lumbar interbody fusion with Dr. Ena Harries Early for approach;  Surgeon: Manya Sells, MD;  Location: [facility] OR;  Service: Neurosurgery;  Laterality: N/A;    APPLICATION OF INTRAOPERATIVE CT SCAN N/A 06/26/2016   Procedure: APPLICATION OF INTRAOPERATIVE CT SCAN;  Surgeon: Manya Sells, MD;  Location: [facility] OR;  Service: Neurosurgery;  Laterality: N/A;   BACK SURGERY  2018   CARDIAC CATHETERIZATION     CARDIOVASCULAR STRESS TEST     Negative in May 2014   CARPOMETACARPAL [facility]) FUSION OF THUMB Right 09/26/2022   Procedure: RIGHT THUMB METACARPALPHALANGEAL JOINT FUSION;  Surgeon: Ltanya Rummer, MD;  Location: [facility];  Service: Orthopedics;  Laterality: Right;  BLOCK AND MAC 90 MIN   COLONOSCOPY     HERNIA REPAIR     umbilical   HIP CLOSED REDUCTION Left 10/03/2018   Procedure: CLOSED REDUCTION HIP;  Surgeon: Adonica Hoose, MD;  Location: [facility] ORS;  Service: Orthopedics;  Laterality: Left;   IR IVC FILTER PLMT / S&I /IMG GUID/MOD SED  03/26/2018   IR RADIOLOGIST EVAL & MGMT  04/16/2019   JOINT REPLACEMENT     LEFT HEART CATH AND CORONARY ANGIOGRAPHY N/A 06/21/2018   Procedure: LEFT HEART CATH AND CORONARY ANGIOGRAPHY;  Surgeon: Lucendia Rusk, MD;  Location: [facility] INVASIVE CV LAB;  Service: Cardiovascular;  Laterality: N/A;   POSTERIOR LUMBAR FUSION 4 LEVEL N/A 06/26/2016   Procedure: Thoracic ten to Pelvis fixation with AIRO;  Surgeon: Manya Sells, MD;  Location: [facility] OR;  Service: Neurosurgery;  Laterality:  N/A;   REVERSE SHOULDER ARTHROPLASTY Right 09/18/2019   Procedure: REVERSE SHOULDER ARTHROPLASTY;  Surgeon: Ellard Gunning, MD;  Location: [facility] ORS;  Service: Orthopedics;  Laterality: Right;    REVERSE SHOULDER ARTHROPLASTY Left 06/10/2020   Procedure: REVERSE SHOULDER ARTHROPLASTY;  Surgeon: Ellard Gunning, MD;  Location: [facility];  Service: Orthopedics;  Laterality: Left;    TENDON REPAIR     right hand   TOTAL HIP ARTHROPLASTY Right 05/25/2014   Procedure: RIGHT TOTAL HIP ARTHROPLASTY ANTERIOR APPROACH;  Surgeon: Adonica Hoose, MD;  Location: [facility] OR;  Service: Orthopedics;  Laterality: Right;   TOTAL HIP ARTHROPLASTY  Left 03/27/2018   Procedure: TOTAL HIP ARTHROPLASTY ANTERIOR APPROACH- DA complex;  Surgeon: Adonica Hoose, MD;  Location: [facility] ORS;  Service: Orthopedics;  Laterality: Left;   TOTAL KNEE ARTHROPLASTY Bilateral    TOTAL SHOULDER REVISION Left 10/14/2020   Procedure: Revision Left reverse shoulder arthroplasty;  Surgeon: Ellard Gunning, MD;  Location: [facility] ORS;  Service: Orthopedics;  Laterality: Left;    TRIGGER FINGER RELEASE Right 09/26/2022   Procedure: RELEASE TRIGGER FINGER/A-1 PULLEY THUMB;  Surgeon: Ltanya Rummer, MD;  Location: [facility];  Service: Orthopedics;  Laterality: Right;   Social History: Social History   Socioeconomic History   Marital status: Married    Spouse name: Jakobe Blau   Number of children: 2   Years of education: 12   Highest education level: 12th grade  Occupational History   Occupation: Astronomer    Comment: [phone]  Tobacco Use   Smoking status: Former    Current packs/day: 0.00    Average packs/day: 1 pack/day for 25.0 years (25.0 ttl pk-yrs)    Types: Cigarettes    Start date: 47    Quit date: 2000    Years since quitting: 25.4   Smokeless tobacco: Current    Types: Snuff  Vaping Use   Vaping status: Never Used  Substance and Sexual Activity   Alcohol  use: Yes    Alcohol /week: 14.0 standard drinks of alcohol     Types: 14 Cans of beer per week    Comment: 2 beers QD   Drug use: No   Sexual activity: Not on file  Other Topics Concern   Not on file  Social History Narrative   Lives with wife in a one story home.  Has 2 children.  Works as a Control and instrumentation engineer.  Education: high school.    Social Drivers of Health   Financial Resource Strain: Patient Declined (06/19/2023)   Overall Financial Resource Strain (CARDIA)    Difficulty of Paying Living Expenses: Patient declined  Food Insecurity: No Food Insecurity (06/19/2023)   Hunger Vital Sign    Worried About Running Out of Food in the Last Year:  Never true    Ran Out of Food in the Last Year: Never true  Transportation Needs: No Transportation Needs (06/19/2023)   PRAPARE - Administrator, Civil Service (Medical): No    Lack of Transportation (Non-Medical): No  Physical Activity: Sufficiently Active (06/19/2023)   Exercise Vital Sign    Days of Exercise per Week: 4 days    Minutes of Exercise per Session: 60 min  Stress: No Stress Concern Present (06/19/2023)   [employer] of Occupational Health - Occupational Stress Questionnaire    Feeling of Stress: Not at all  Social Connections: Socially Integrated (06/19/2023)   Social Connection and Isolation Panel    Frequency of Communication with Friends and Family: Twice  a week    Frequency of Social Gatherings with Friends and Family: Once a week    Attends Religious Services: More than 4 times per year    Active Member of [employer] or Organizations: Yes    Attends Engineer, structural: More than 4 times per year    Marital Status: Married   Family History: Family History  Adopted: Yes  Problem Relation Age of Onset   Healthy Daughter    Healthy Daughter    Allergies: No Known Allergies Medications: See med rec.  Review of Systems: No headache, visual changes, nausea, vomiting, diarrhea, constipation, dizziness, abdominal pain, skin rash, fevers, chills, night sweats, swollen lymph nodes, weight loss, chest pain, body aches, joint swelling, muscle aches, shortness of breath, mood changes, visual or auditory hallucinations.  Objective:    General: Well Developed, well nourished, and in no acute distress.  Neuro: Alert and oriented x3, extra-ocular muscles intact, sensation grossly intact. Cranial nerves II through XII are intact, motor, sensory, and coordinative functions are all intact. HEENT: Normocephalic, atraumatic, pupils equal round reactive to light, neck supple, no masses, no lymphadenopathy, thyroid  nonpalpable. Oropharynx, nasopharynx, external  ear canals are unremarkable. Skin: Warm and dry, no rashes noted.  Cardiac: Regular rate and rhythm, no murmurs rubs or gallops.  Respiratory: Clear to auscultation bilaterally. Not using accessory muscles, speaking in full sentences.  Abdominal: Soft, nontender, nondistended, positive bowel sounds, no masses, no organomegaly.  Musculoskeletal: Lower extremity atrophy, expected swelling, knobbieness, discomfort wrists, hands.  Procedure: Real-time Ultrasound Guided injection of the left radiocarpal joint Device: Samsung HS60  Verbal informed consent obtained.  Time-out conducted.  Noted no overlying erythema, induration, or other signs of local infection.  Skin prepped in a sterile fashion.  Local anesthesia: Topical Ethyl chloride.  With sterile technique and under real time ultrasound guidance: Arthritic joint noted, 1 cc Kenalog  40, 1 cc lidocaine , 1 cc bupivacaine  injected easily Completed without difficulty  Advised to call if fevers/chills, erythema, induration, drainage, or persistent bleeding.  Images permanently stored and available for review in PACS.  Impression: Technically successful ultrasound guided injection.  Procedure: Real-time Ultrasound Guided injection of the right radiocarpal joint Device: Samsung HS60  Verbal informed consent obtained.  Time-out conducted.  Noted no overlying erythema, induration, or other signs of local infection.  Skin prepped in a sterile fashion.  Local anesthesia: Topical Ethyl chloride.  With sterile technique and under real time ultrasound guidance: Arthritic joint noted, 1 cc Kenalog  40, 1 cc lidocaine , 1 cc bupivacaine  injected easily Completed without difficulty  Advised to call if fevers/chills, erythema, induration, drainage, or persistent bleeding.  Images permanently stored and available for review in PACS.  Impression: Technically successful ultrasound guided injection.  Impression and Recommendations:    The patient was  counselled, risk factors were discussed, anticipatory guidance given.  Annual physical exam Fasting annual physical. He will return for Prevnar 20 with a nurse visit. Routine labs today.  Primary osteoarthritis of both wrists Bilateral injections, last done about 2 years ago. He also has carpometacarpal osteoarthritis, not injected today. He would also like a second opinion from Dr. Aloha Arnold.   ____________________________________________ Joselyn Nicely. Sandy Crumb, M.D., ABFM., CAQSM., AME. Primary Care and Sports Medicine [facility]  Adjunct Professor of [facility] Medicine  [employer]  Restaurant manager, fast food

## 2023-06-20 NOTE — Assessment & Plan Note (Signed)
 Fasting annual physical. He will return for Prevnar 20 with a nurse visit. Routine labs today.

## 2023-06-20 NOTE — Assessment & Plan Note (Addendum)
 Bilateral injections, last done about 2 years ago. He also has carpometacarpal osteoarthritis, not injected today. He would also like a second opinion from Dr. Aloha Arnold.

## 2023-06-21 ENCOUNTER — Ambulatory Visit: Payer: Self-pay | Admitting: Sports Medicine

## 2023-06-21 LAB — COMPREHENSIVE METABOLIC PANEL WITH GFR
ALT: 23 IU/L (ref 0–44)
AST: 38 IU/L (ref 0–40)
Albumin: 4.1 g/dL (ref 3.9–4.9)
Alkaline Phosphatase: 81 IU/L (ref 44–121)
BUN/Creatinine Ratio: 26 — ABNORMAL HIGH (ref 10–24)
BUN: 18 mg/dL (ref 8–27)
Bilirubin Total: 0.5 mg/dL (ref 0.0–1.2)
CO2: 22 mmol/L (ref 20–29)
Calcium: 9.4 mg/dL (ref 8.6–10.2)
Chloride: 100 mmol/L (ref 96–106)
Creatinine, Ser: 0.69 mg/dL — ABNORMAL LOW (ref 0.76–1.27)
Globulin, Total: 2.2 g/dL (ref 1.5–4.5)
Glucose: 97 mg/dL (ref 70–99)
Potassium: 3.9 mmol/L (ref 3.5–5.2)
Sodium: 139 mmol/L (ref 134–144)
Total Protein: 6.3 g/dL (ref 6.0–8.5)
eGFR: 105 mL/min/{1.73_m2} (ref >59)

## 2023-06-21 LAB — VITAMIN B12: Vitamin B-12: 734 pg/mL (ref 232–1245)

## 2023-06-21 LAB — LIPID PANEL
Chol/HDL Ratio: 1.7 ratio (ref 0.0–5.0)
Cholesterol, Total: 134 mg/dL (ref 100–199)
HDL: 78 mg/dL (ref >39)
LDL Chol Calc (NIH): 44 mg/dL (ref 0–99)
Triglycerides: 52 mg/dL (ref 0–149)
VLDL Cholesterol Cal: 12 mg/dL (ref 5–40)

## 2023-06-21 LAB — PSA, TOTAL AND FREE
PSA, Free Pct: 13.2 %
PSA, Free: 0.25 ng/mL
Prostate Specific Ag, Serum: 1.9 ng/mL (ref 0.0–4.0)

## 2023-06-21 LAB — HEMOGLOBIN A1C
Est. average glucose Bld gHb Est-mCnc: 111 mg/dL
Hgb A1c MFr Bld: 5.5 % (ref 4.8–5.6)

## 2023-06-21 LAB — CBC
Hematocrit: 46.7 % (ref 37.5–51.0)
Hemoglobin: 15.2 g/dL (ref 13.0–17.7)
MCH: 31.7 pg (ref 26.6–33.0)
MCHC: 32.5 g/dL (ref 31.5–35.7)
MCV: 98 fL — ABNORMAL HIGH (ref 79–97)
Platelets: 207 10*3/uL (ref 150–450)
RBC: 4.79 x10E6/uL (ref 4.14–5.80)
RDW: 12.5 % (ref 11.6–15.4)
WBC: 7.4 10*3/uL (ref 3.4–10.8)

## 2023-06-21 LAB — TSH: TSH: 2.81 u[IU]/mL (ref 0.450–4.500)

## 2023-06-21 LAB — VITAMIN D 25 HYDROXY (VIT D DEFICIENCY, FRACTURES): Vit D, 25-Hydroxy: 55.7 ng/mL (ref 30.0–100.0)

## 2023-06-28 ENCOUNTER — Ambulatory Visit

## 2023-06-28 DIAGNOSIS — I255 Ischemic cardiomyopathy: Secondary | ICD-10-CM | POA: Diagnosis not present

## 2023-06-28 LAB — CUP PACEART REMOTE DEVICE CHECK
Date Time Interrogation Session: 20250625231523
Implantable Pulse Generator Implant Date: 20220727

## 2023-07-01 ENCOUNTER — Other Ambulatory Visit: Payer: Self-pay | Admitting: Sports Medicine

## 2023-07-01 ENCOUNTER — Ambulatory Visit: Payer: Self-pay | Admitting: Cardiology

## 2023-07-01 DIAGNOSIS — F5101 Primary insomnia: Secondary | ICD-10-CM

## 2023-07-02 ENCOUNTER — Other Ambulatory Visit (HOSPITAL_BASED_OUTPATIENT_CLINIC_OR_DEPARTMENT_OTHER): Payer: Self-pay

## 2023-07-02 ENCOUNTER — Ambulatory Visit: Payer: Commercial Managed Care - PPO

## 2023-07-02 MED ORDER — CYCLOBENZAPRINE HCL 10 MG PO TABS
10.0000 mg | ORAL_TABLET | Freq: Every day | ORAL | 3 refills | Status: AC
  Filled 2023-07-02: qty 90, 90d supply, fill #0
  Filled 2023-09-22: qty 90, 90d supply, fill #1
  Filled 2023-12-23: qty 90, 90d supply, fill #2

## 2023-07-15 ENCOUNTER — Other Ambulatory Visit: Payer: Self-pay | Admitting: Cardiology

## 2023-07-17 ENCOUNTER — Other Ambulatory Visit (HOSPITAL_BASED_OUTPATIENT_CLINIC_OR_DEPARTMENT_OTHER): Payer: Self-pay

## 2023-07-17 ENCOUNTER — Encounter (INDEPENDENT_AMBULATORY_CARE_PROVIDER_SITE_OTHER): Payer: Self-pay | Admitting: Sports Medicine

## 2023-07-17 ENCOUNTER — Other Ambulatory Visit: Payer: Self-pay

## 2023-07-17 DIAGNOSIS — K05219 Aggressive periodontitis, localized, unspecified severity: Secondary | ICD-10-CM | POA: Diagnosis not present

## 2023-07-17 MED ORDER — MULTAQ 400 MG PO TABS
400.0000 mg | ORAL_TABLET | Freq: Two times a day (BID) | ORAL | 1 refills | Status: DC
  Filled 2023-07-17: qty 60, 30d supply, fill #0
  Filled 2023-08-19: qty 60, 30d supply, fill #1

## 2023-07-17 MED ORDER — AMOXICILLIN-POT CLAVULANATE 875-125 MG PO TABS: 1.0000 | ORAL_TABLET | Freq: Two times a day (BID) | ORAL | 0 refills | Status: DC

## 2023-07-17 MED ORDER — AMOXICILLIN-POT CLAVULANATE 875-125 MG PO TABS
1.0000 | ORAL_TABLET | Freq: Two times a day (BID) | ORAL | 0 refills | Status: DC
  Filled 2023-07-17: qty 20, 10d supply, fill #0

## 2023-07-17 NOTE — Telephone Encounter (Signed)
 Moved prescription to [facility] as requested by patient.

## 2023-07-17 NOTE — Telephone Encounter (Signed)
 Resent Augmentin  prescription to [facility]  at patient's request

## 2023-07-17 NOTE — Telephone Encounter (Signed)

## 2023-07-20 ENCOUNTER — Other Ambulatory Visit (HOSPITAL_BASED_OUTPATIENT_CLINIC_OR_DEPARTMENT_OTHER): Payer: Self-pay

## 2023-07-20 NOTE — Progress Notes (Signed)
 [facility] Summary Report / Loop Recorder

## 2023-07-20 NOTE — Addendum Note (Signed)
 Addended by: TAWNI DRILLING D on: 07/20/2023 12:40 PM   Modules accepted: Orders

## 2023-07-25 ENCOUNTER — Other Ambulatory Visit (HOSPITAL_BASED_OUTPATIENT_CLINIC_OR_DEPARTMENT_OTHER): Payer: Self-pay

## 2023-07-25 ENCOUNTER — Other Ambulatory Visit: Payer: Self-pay

## 2023-07-25 ENCOUNTER — Other Ambulatory Visit (HOSPITAL_COMMUNITY): Payer: Self-pay | Admitting: Cardiology

## 2023-07-25 ENCOUNTER — Other Ambulatory Visit (HOSPITAL_COMMUNITY): Payer: Self-pay | Admitting: Sports Medicine

## 2023-07-25 DIAGNOSIS — F5101 Primary insomnia: Secondary | ICD-10-CM

## 2023-07-25 MED ORDER — APIXABAN 5 MG PO TABS
5.0000 mg | ORAL_TABLET | Freq: Two times a day (BID) | ORAL | 0 refills | Status: DC
  Filled 2023-07-25: qty 180, 90d supply, fill #0

## 2023-07-25 MED ORDER — ZOLPIDEM TARTRATE 10 MG PO TABS
10.0000 mg | ORAL_TABLET | Freq: Every evening | ORAL | 1 refills | Status: DC | PRN
  Filled 2023-07-25: qty 90, 90d supply, fill #0
  Filled 2023-10-21 – 2023-10-26 (×2): qty 90, 90d supply, fill #1

## 2023-07-25 NOTE — Telephone Encounter (Signed)
 Pt last saw Dr Inocencio 06/19/22, pt is overdue for follow-up.  Pt is scheduled for 08/27/23 to see Dr Inocencio.  Last labs 06/20/23 Creat 0.69, age 63, weight 92.1kg, based on specified criteria pt is on appropriate dosage of Eliquis  5mg  BID for afib.  Will refill rx.

## 2023-07-30 ENCOUNTER — Ambulatory Visit (INDEPENDENT_AMBULATORY_CARE_PROVIDER_SITE_OTHER)

## 2023-07-30 DIAGNOSIS — I480 Paroxysmal atrial fibrillation: Secondary | ICD-10-CM

## 2023-07-31 LAB — CUP PACEART REMOTE DEVICE CHECK
Date Time Interrogation Session: 20250727231134
Implantable Pulse Generator Implant Date: 20220727

## 2023-08-01 ENCOUNTER — Ambulatory Visit: Payer: Self-pay | Admitting: Cardiology

## 2023-08-06 ENCOUNTER — Ambulatory Visit: Payer: Commercial Managed Care - PPO

## 2023-08-08 ENCOUNTER — Other Ambulatory Visit (HOSPITAL_BASED_OUTPATIENT_CLINIC_OR_DEPARTMENT_OTHER): Payer: Self-pay

## 2023-08-08 ENCOUNTER — Other Ambulatory Visit: Payer: Self-pay | Admitting: Cardiology

## 2023-08-08 MED ORDER — LISINOPRIL-HYDROCHLOROTHIAZIDE 20-12.5 MG PO TABS
2.0000 | ORAL_TABLET | Freq: Every day | ORAL | 0 refills | Status: DC
  Filled 2023-08-08: qty 60, 30d supply, fill #0

## 2023-08-19 ENCOUNTER — Other Ambulatory Visit: Payer: Self-pay | Admitting: Cardiology

## 2023-08-20 ENCOUNTER — Other Ambulatory Visit (HOSPITAL_BASED_OUTPATIENT_CLINIC_OR_DEPARTMENT_OTHER): Payer: Self-pay

## 2023-08-22 ENCOUNTER — Other Ambulatory Visit (HOSPITAL_BASED_OUTPATIENT_CLINIC_OR_DEPARTMENT_OTHER): Payer: Self-pay

## 2023-08-22 MED ORDER — DILTIAZEM HCL ER COATED BEADS 120 MG PO CP24
120.0000 mg | ORAL_CAPSULE | Freq: Every day | ORAL | 0 refills | Status: DC
  Filled 2023-08-22: qty 30, 30d supply, fill #0

## 2023-08-27 ENCOUNTER — Encounter: Payer: Self-pay | Admitting: Cardiology

## 2023-08-27 ENCOUNTER — Other Ambulatory Visit (HOSPITAL_BASED_OUTPATIENT_CLINIC_OR_DEPARTMENT_OTHER): Payer: Self-pay

## 2023-08-27 ENCOUNTER — Ambulatory Visit: Attending: Cardiology | Admitting: Cardiology

## 2023-08-27 VITALS — BP 142/90 | HR 64 | Ht 71.0 in | Wt 202.0 lb

## 2023-08-27 DIAGNOSIS — D6869 Other thrombophilia: Secondary | ICD-10-CM | POA: Diagnosis not present

## 2023-08-27 DIAGNOSIS — I2510 Atherosclerotic heart disease of native coronary artery without angina pectoris: Secondary | ICD-10-CM | POA: Diagnosis not present

## 2023-08-27 DIAGNOSIS — I480 Paroxysmal atrial fibrillation: Secondary | ICD-10-CM | POA: Diagnosis not present

## 2023-08-27 DIAGNOSIS — I493 Ventricular premature depolarization: Secondary | ICD-10-CM

## 2023-08-27 DIAGNOSIS — I10 Essential (primary) hypertension: Secondary | ICD-10-CM

## 2023-08-27 MED ORDER — METOPROLOL SUCCINATE ER 50 MG PO TB24
50.0000 mg | ORAL_TABLET | Freq: Every day | ORAL | 3 refills | Status: AC
  Filled 2023-08-27 – 2023-10-23 (×2): qty 90, 90d supply, fill #0
  Filled 2024-01-21: qty 90, 90d supply, fill #1

## 2023-08-27 MED ORDER — ATORVASTATIN CALCIUM 80 MG PO TABS
80.0000 mg | ORAL_TABLET | Freq: Every day | ORAL | 3 refills | Status: AC
  Filled 2023-08-27 – 2023-09-02 (×2): qty 90, 90d supply, fill #0
  Filled 2023-12-03: qty 90, 90d supply, fill #1

## 2023-08-27 MED ORDER — DILTIAZEM HCL ER COATED BEADS 120 MG PO CP24
120.0000 mg | ORAL_CAPSULE | Freq: Every day | ORAL | 3 refills | Status: AC
  Filled 2023-09-22: qty 90, 90d supply, fill #0
  Filled 2023-12-16: qty 90, 90d supply, fill #1

## 2023-08-27 MED ORDER — LISINOPRIL-HYDROCHLOROTHIAZIDE 20-12.5 MG PO TABS
2.0000 | ORAL_TABLET | Freq: Every day | ORAL | 3 refills | Status: AC
  Filled 2023-08-27 – 2023-09-15 (×2): qty 180, 90d supply, fill #0
  Filled 2023-12-11: qty 180, 90d supply, fill #1
  Filled ????-??-??: fill #0

## 2023-08-27 MED ORDER — APIXABAN 5 MG PO TABS
5.0000 mg | ORAL_TABLET | Freq: Two times a day (BID) | ORAL | 3 refills | Status: AC
  Filled 2023-08-27 – 2023-10-23 (×2): qty 180, 90d supply, fill #0
  Filled 2024-01-21: qty 180, 90d supply, fill #1

## 2023-08-27 NOTE — Progress Notes (Signed)
   Electrophysiology Office Note:   Date:  08/27/2023  ID:  Travis Huff, Travis Huff [birth year], MRN [id number]  Primary Cardiologist: Victory LELON Claudene DOUGLAS, MD (Inactive) Primary Heart Failure: None Electrophysiologist: Shekia Kuper [name], MD      History of Present Illness:   Travis Huff is a 63 y.o. male with h/o atrial fibrillation, coronary artery disease, hypertension seen today for routine electrophysiology followup.   Since last being seen in our clinic the patient reports doing well.  He has no chest pain or shortness of breath.  He is able to do all his daily activities without restriction.  He is unaware of further episodes of atrial fibrillation.  He does intermittently feel palpitations that he feels are related to PVCs..  he denies chest pain, palpitations, dyspnea, PND, orthopnea, nausea, vomiting, dizziness, syncope, edema, weight gain, or early satiety.   Review of systems complete and found to be negative unless listed in HPI.   Device History: Medtronic loop recorder implanted 07/27/2020 for Atrial fibrillation  EP Information / Studies Reviewed:    EKG is ordered today. Personal review as below.  EKG Interpretation Date/Time:  Monday August 27 2023 14:57:34 EDT Ventricular Rate:  64 PR Interval:  162 QRS Duration:  104 QT Interval:  406 QTC Calculation: 418 R Axis:   258  Text Interpretation: Normal sinus rhythm Right superior axis deviation Pulmonary disease pattern Incomplete right bundle branch block Right ventricular hypertrophy When compared with ECG of 01-Jun-2020 14:40, No significant change since last tracing Confirmed by Travis Huff ([id number]) on 08/27/2023 3:04:36 PM     Risk Assessment/Calculations:    CHA2DS2-VASc Score = 3   This indicates a 3.2% annual risk of stroke. The patient's score is based upon: CHF History: 1 HTN History: 1 Diabetes History: 0 Stroke History: 0 Vascular Disease History: 1 Age Score: 0 Gender Score: 0             Physical Exam:   VS:  BP (!) 142/90   Pulse 64   Ht 5' 11 (1.803 m)   Wt 202 lb (91.6 kg)   SpO2 96%   BMI 28.17 kg/m    Wt Readings from Last 3 Encounters:  08/27/23 202 lb (91.6 kg)  06/20/23 203 lb (92.1 kg)  05/21/23 204 lb (92.5 kg)     GEN: Well nourished, well developed in no acute distress NECK: No JVD; No carotid bruits CARDIAC: Regular rate and rhythm, no murmurs, rubs, gallops RESPIRATORY:  Clear to auscultation without rales, wheezing or rhonchi  ABDOMEN: Soft, non-tender, non-distended EXTREMITIES:  No edema; No deformity   ILR Interrogation- reviewed in detail today,  See PACEART report  ASSESSMENT AND PLAN:    Atrial fibrillation s/p Medtronic Loop recorder Normal device function See Pace Art report Patient previously on Multaq .  Karysa Heft stop today.  He Renarda Mullinix call us  if he goes back into atrial fibrillation.  2.  Secondary hypercoagulable date: On Eliquis   3.  PVCs: 0.3% burden on the monitor.  Continue monitoring.  4.  Cardiomyopathy: Ejection fraction is improved  5.  Coronary disease: Distal LAD stenosis.  Thought due to dissection.  No current chest pain.  6.  Hypertension: Mildly elevated but usually well-controlled     Follow up with Dr. Norton in 12 months  Signed, [name] [name], MD

## 2023-08-30 ENCOUNTER — Ambulatory Visit

## 2023-08-30 DIAGNOSIS — I480 Paroxysmal atrial fibrillation: Secondary | ICD-10-CM

## 2023-08-30 LAB — CUP PACEART REMOTE DEVICE CHECK
Date Time Interrogation Session: 20250827231226
Implantable Pulse Generator Implant Date: 20220727

## 2023-09-02 ENCOUNTER — Other Ambulatory Visit (HOSPITAL_BASED_OUTPATIENT_CLINIC_OR_DEPARTMENT_OTHER): Payer: Self-pay

## 2023-09-03 ENCOUNTER — Other Ambulatory Visit (HOSPITAL_BASED_OUTPATIENT_CLINIC_OR_DEPARTMENT_OTHER): Payer: Self-pay

## 2023-09-04 ENCOUNTER — Encounter: Payer: Self-pay | Admitting: Sports Medicine

## 2023-09-07 ENCOUNTER — Ambulatory Visit: Payer: Self-pay | Admitting: Cardiology

## 2023-09-10 ENCOUNTER — Ambulatory Visit: Payer: Commercial Managed Care - PPO

## 2023-09-13 NOTE — Progress Notes (Signed)
 Remote Loop Recorder Transmission

## 2023-09-14 NOTE — Progress Notes (Signed)
 [facility] Summary Report / Loop Recorder

## 2023-09-17 ENCOUNTER — Other Ambulatory Visit (HOSPITAL_BASED_OUTPATIENT_CLINIC_OR_DEPARTMENT_OTHER): Payer: Self-pay

## 2023-09-19 ENCOUNTER — Ambulatory Visit (INDEPENDENT_AMBULATORY_CARE_PROVIDER_SITE_OTHER)

## 2023-09-19 VITALS — Temp 98.0°F

## 2023-09-19 DIAGNOSIS — Z23 Encounter for immunization: Secondary | ICD-10-CM | POA: Diagnosis not present

## 2023-09-19 DIAGNOSIS — H2513 Age-related nuclear cataract, bilateral: Secondary | ICD-10-CM | POA: Diagnosis not present

## 2023-09-19 NOTE — Progress Notes (Signed)
 Patient is in office today for a nurse visit to receive a Prevnar-20 vaccine. Patients Prevnar- 20 Injection was given in the  Left deltoid. Patient tolerated injection well.

## 2023-09-23 ENCOUNTER — Other Ambulatory Visit (HOSPITAL_BASED_OUTPATIENT_CLINIC_OR_DEPARTMENT_OTHER): Payer: Self-pay

## 2023-09-24 ENCOUNTER — Other Ambulatory Visit: Payer: Self-pay

## 2023-10-01 ENCOUNTER — Ambulatory Visit (INDEPENDENT_AMBULATORY_CARE_PROVIDER_SITE_OTHER)

## 2023-10-01 DIAGNOSIS — I480 Paroxysmal atrial fibrillation: Secondary | ICD-10-CM

## 2023-10-01 LAB — CUP PACEART REMOTE DEVICE CHECK
Date Time Interrogation Session: 20250928231237
Implantable Pulse Generator Implant Date: 20220727

## 2023-10-02 ENCOUNTER — Ambulatory Visit: Payer: Self-pay | Admitting: Cardiology

## 2023-10-03 DIAGNOSIS — M19031 Primary osteoarthritis, right wrist: Secondary | ICD-10-CM | POA: Diagnosis not present

## 2023-10-03 DIAGNOSIS — M19032 Primary osteoarthritis, left wrist: Secondary | ICD-10-CM | POA: Diagnosis not present

## 2023-10-03 NOTE — Progress Notes (Signed)
 Remote Loop Recorder Transmission

## 2023-10-04 NOTE — Progress Notes (Signed)
 Remote Loop Recorder Transmission

## 2023-10-15 ENCOUNTER — Ambulatory Visit: Payer: Commercial Managed Care - PPO

## 2023-10-22 ENCOUNTER — Other Ambulatory Visit (HOSPITAL_COMMUNITY): Payer: Self-pay

## 2023-10-22 ENCOUNTER — Other Ambulatory Visit (HOSPITAL_BASED_OUTPATIENT_CLINIC_OR_DEPARTMENT_OTHER): Payer: Self-pay

## 2023-10-22 ENCOUNTER — Other Ambulatory Visit: Payer: Self-pay

## 2023-10-23 ENCOUNTER — Other Ambulatory Visit (HOSPITAL_BASED_OUTPATIENT_CLINIC_OR_DEPARTMENT_OTHER): Payer: Self-pay

## 2023-11-01 ENCOUNTER — Ambulatory Visit (INDEPENDENT_AMBULATORY_CARE_PROVIDER_SITE_OTHER)

## 2023-11-01 DIAGNOSIS — I480 Paroxysmal atrial fibrillation: Secondary | ICD-10-CM

## 2023-11-01 LAB — CUP PACEART REMOTE DEVICE CHECK
Date Time Interrogation Session: 20251029231025
Implantable Pulse Generator Implant Date: 20220727

## 2023-11-01 NOTE — Progress Notes (Signed)
 Remote Loop Recorder Transmission

## 2023-11-02 ENCOUNTER — Emergency Department (HOSPITAL_COMMUNITY)

## 2023-11-02 ENCOUNTER — Ambulatory Visit: Payer: Self-pay | Admitting: Cardiology

## 2023-11-02 ENCOUNTER — Other Ambulatory Visit: Payer: Self-pay

## 2023-11-02 ENCOUNTER — Emergency Department (HOSPITAL_COMMUNITY)
Admission: EM | Admit: 2023-11-02 | Discharge: 2023-11-02 | Disposition: A | Discharge status: Home / Self Care | Attending: Emergency Medicine | Admitting: Emergency Medicine

## 2023-11-02 DIAGNOSIS — M7989 Other specified soft tissue disorders: Secondary | ICD-10-CM | POA: Insufficient documentation

## 2023-11-02 DIAGNOSIS — S43005A Unspecified dislocation of left shoulder joint, initial encounter: Secondary | ICD-10-CM | POA: Insufficient documentation

## 2023-11-02 DIAGNOSIS — Z86718 Personal history of other venous thrombosis and embolism: Secondary | ICD-10-CM | POA: Diagnosis not present

## 2023-11-02 DIAGNOSIS — Z7901 Long term (current) use of anticoagulants: Secondary | ICD-10-CM | POA: Diagnosis not present

## 2023-11-02 DIAGNOSIS — S4992XA Unspecified injury of left shoulder and upper arm, initial encounter: Secondary | ICD-10-CM | POA: Diagnosis present

## 2023-11-02 DIAGNOSIS — X501XXA Overexertion from prolonged static or awkward postures, initial encounter: Secondary | ICD-10-CM | POA: Diagnosis not present

## 2023-11-02 DIAGNOSIS — Z96611 Presence of right artificial shoulder joint: Secondary | ICD-10-CM | POA: Diagnosis not present

## 2023-11-02 DIAGNOSIS — S43015A Anterior dislocation of left humerus, initial encounter: Secondary | ICD-10-CM | POA: Diagnosis not present

## 2023-11-02 DIAGNOSIS — Z96612 Presence of left artificial shoulder joint: Secondary | ICD-10-CM | POA: Diagnosis not present

## 2023-11-02 MED ORDER — MORPHINE SULFATE (PF) 4 MG/ML IV SOLN
6.0000 mg | Freq: Once | INTRAVENOUS | Status: AC
  Administered 2023-11-02: 6 mg via INTRAVENOUS
  Filled 2023-11-02: qty 2

## 2023-11-02 MED ORDER — PROPOFOL 10 MG/ML IV BOLUS
INTRAVENOUS | Status: AC | PRN
  Administered 2023-11-02 (×2): 45 mg via INTRAVENOUS

## 2023-11-02 MED ORDER — PROPOFOL 10 MG/ML IV BOLUS
0.5000 mg/kg | Freq: Once | INTRAVENOUS | Status: AC
  Administered 2023-11-02: 44.9 mg via INTRAVENOUS
  Filled 2023-11-02: qty 20

## 2023-11-02 MED ORDER — FENTANYL CITRATE (PF) 50 MCG/ML IJ SOSY
50.0000 ug | PREFILLED_SYRINGE | Freq: Once | INTRAMUSCULAR | Status: AC
  Administered 2023-11-02: 50 ug via INTRAVENOUS
  Filled 2023-11-02: qty 1

## 2023-11-02 NOTE — ED Notes (Addendum)
 Pt complaining of dislocation in left shoulder with HX of same. Radial pulse present at this time.

## 2023-11-02 NOTE — Progress Notes (Signed)
 Respiratory Therapist at [facility]  in room number __WA08__ during procedure.  Suction with [place] at [facility] set up and ready to use. YES Ambu bag at [facility] and ready to use. YES Patient placed on ETCO2 Nasal Cannula at ____2 LPM.    Vitals at conclusion of procedure:  ETCO2 ___27__ mmHg HR 87 RR 16 SPO2 99

## 2023-11-02 NOTE — Progress Notes (Signed)
 Orthopedic Tech Progress Note Patient Details:  Travis Huff 1960/08/16 [id number]  Ortho Devices Type of Ortho Device: Sling immobilizer Ortho Device/Splint Location: LUE Ortho Device/Splint Interventions: Application   Post Interventions Patient Tolerated: Well  Travis Huff Barnard Sharps 11/02/2023, 5:36 PM

## 2023-11-02 NOTE — ED Provider Notes (Signed)
 [place] EMERGENCY DEPARTMENT AT [facility] Provider Note   CSN: [id number] Arrival date & time: 11/02/23  1441     Patient presents with: Shoulder Pain   Travis Huff is a 63 y.o. male.   The history is provided by the patient and medical records. No language interpreter was used.  Shoulder Pain    64 year old male with history of recurrent shoulder dislocation, spinal stenosis, DVT on Eliquis , paroxysmal A-fib, osteoarthritis presenting with complaints of shoulder pain.  History obtained through patient and through wife who is at bedside.  Around noon time patient was in the process of fixing his lunch when he reached over to grab a spatula and felt severe pain about his left shoulder.  Felt like the shoulder was dislocated.  Pain is intense now with tingling sensation down to his fingers.  He went to [facility] to be evaluated, states an x-ray was done and he was told that he had anterior shoulder dislocation but because the orthopedic specialist is not available in the office he was recommended to come to the ER for further evaluation.  His last meal was 4 hours ago.  He mention he may have dislocated his left shoulder a few days ago and it popped back in without any intervention.  He has had shoulder surgery to the same shoulder in the past by Dr. Melita.  He denies any neck pain, elbow pain, or any other injury.  Prior to Admission medications   Medication Sig Start Date End Date Taking? Authorizing Provider  apixaban  (ELIQUIS ) 5 MG TABS tablet Take 1 tablet (5 mg total) by mouth 2 (two) times daily. 08/27/23  Yes Camnitz, Soyla Lunger, MD  acetaminophen  (TYLENOL ) 500 MG tablet Take 1,000 mg by mouth every 6 (six) hours as needed for mild pain or headache.    [provider]  atorvastatin  (LIPITOR ) 80 MG tablet Take 1 tablet (80 mg total) by mouth daily at 6pm. 08/27/23   Camnitz, Soyla Lunger, MD  Cholecalciferol  (VITAMIN D3) 1000 units CAPS Take 1 capsule by  mouth daily.    [provider]  Cyanocobalamin  (VITAMIN B 12 PO) Take by mouth. 2500 mcg weekly    [provider]  cyclobenzaprine  (FLEXERIL ) 10 MG tablet Take 1 tablet (10 mg total) by mouth at bedtime. 07/02/23   Curtis Debby JINNY, MD  diltiazem  (CARTIA  XT) 120 MG 24 hr capsule Take 1 capsule (120 mg total) by mouth daily. 08/27/23   Camnitz, Soyla Lunger, MD  lisinopril -hydrochlorothiazide  (ZESTORETIC ) 20-12.5 MG tablet Take 2 tablets by mouth daily. PATIENT MUST KEEP SCHEDULED ANNUAL APPOINTMENT FOR FURTHER REFILLS 08/27/23   Inocencio Soyla Lunger, MD  metoprolol  succinate (TOPROL -XL) 50 MG 24 hr tablet Take 1 tablet (50 mg total) by mouth daily. Take with or immediately following a meal. 08/27/23   Camnitz, Soyla Lunger, MD  sildenafil  (VIAGRA ) 100 MG tablet Take 1 tablet (100 mg total) by mouth daily as needed. Avoid use with nitroglycerin  03/05/23   Curtis Debby JINNY, MD  zolpidem  (AMBIEN ) 10 MG tablet Take 1 tablet (10 mg total) by mouth at bedtime as needed for sleep. 07/25/23 01/26/24  Curtis Debby JINNY, MD    Allergies: Patient has no known allergies.    Review of Systems  Musculoskeletal:  Positive for arthralgias.  Skin:  Negative for wound.    Updated Vital Signs BP (!) 156/125 (BP Location: Right Arm)   Pulse 89   Temp 99.2 F (37.3 C) (Oral)   Resp 18  SpO2 100%   Physical Exam Constitutional:      General: He is not in acute distress.    Appearance: He is well-developed.     Comments: Patient is laying in the bed in a prone position with his arm dangling on the side of the bed.  He is having trouble moving his left arm/shoulder  HENT:     Head: Atraumatic.  Eyes:     Conjunctiva/sclera: Conjunctivae normal.  Musculoskeletal:        General: Tenderness (L Shoulder: TTP, decreased ROM, sensation intact, no obvious deformity noted) present.     Cervical back: Normal range of motion and neck supple.  Skin:    Findings: No rash.   Neurological:     Mental Status: He is alert.     (all labs ordered are listed, but only abnormal results are displayed) Labs Reviewed - No data to display  EKG: None  Radiology: DG Shoulder Left Portable Result Date: 11/02/2023 EXAM: 1 VIEW XRAY OF THE LEFT SHOULDER 11/02/2023 06:10:00 PM COMPARISON: Same day status post reduction of glenohumeral joint arthroplasty dislocation. CLINICAL HISTORY: FINDINGS: BONES AND JOINTS: The glenohumeral joint is reduced and aligned. There is a history of glenohumeral joint arthroplasty dislocation which has been reduced. No acute fracture. The [facility] joint is unremarkable in appearance. SOFT TISSUES: No abnormal calcifications. Visualized lung is unremarkable. IMPRESSION: 1. Status post reduction of glenohumeral joint arthroplasty dislocation. Electronically signed by: Lynwood Seip MD 11/02/2023 06:21 PM EDT RP Workstation: [id number]   DG Shoulder Left Result Date: 11/02/2023 CLINICAL DATA:  Left shoulder pain EXAM: LEFT SHOULDER - 2+ VIEW COMPARISON:  Chest x-ray 11/09/2021 FINDINGS: Reverse left shoulder arthroplasty. Superior dislocation of the humerus component with respect to the glenoid. Advanced degenerative changes involving the AC joint. No fracture is seen. IMPRESSION: Reverse left shoulder arthroplasty with superior dislocation of the humerus component with respect to the glenoid. Electronically Signed   By: [name] Bun M.D.   On: 11/02/2023 16:56   CUP PACEART REMOTE DEVICE CHECK Result Date: 11/01/2023 ILR summary report received. Battery status OK. Normal device function. No new symptom, tachy, brady, or pause episodes. AT/AF episode per trends below detection, burden 0%. Monthly summary reports and ROV/PRN 4 false pause events c/w undersensing LA, CVRS    .Reduction of dislocation  Date/Time: 11/02/2023 5:29 PM  Performed by: Nivia Colon, PA-C Authorized by: Nivia Colon, PA-C  Consent: Verbal consent obtained. Written consent  obtained Risks and benefits: risks, benefits and alternatives were discussed Consent given by: patient Patient understanding: patient states understanding of the procedure being performed Patient consent: the patient's understanding of the procedure matches consent given Procedure consent: procedure consent matches procedure scheduled Relevant documents: relevant documents present and verified Imaging studies: imaging studies available Time out: Immediately prior to procedure a time out was called to verify the correct patient, procedure, equipment, support staff and site/side marked as required. Local anesthesia used: no  Anesthesia: Local anesthesia used: no  Sedation: Patient sedated: yes Sedatives: propofol  Analgesia: fentanyl  Sedation start date/time: 11/02/2023 5:24 PM Sedation end date/time: 11/02/2023 5:30 PM Vitals: Vital signs were monitored during sedation.  Patient tolerance: patient tolerated the procedure well with no immediate complications Comments: Patient was placed on cardiac monitoring, supplemental oxygen and end-tidal O2 in place.  Nurse, [employer], and respiratory technologist at bedside.  Attending Dr. Randol was at bedside.  Patient given propofol  with good sedation, shoulder was reduced by me using traction countertraction.  Shoulder came back easily with first  attempt.  Sling placed to stabilize the shoulder.  Patient regain consciousness and protect his airway.  Postreduction x-ray obtained.      Medications Ordered in the ED  morphine  (PF) 4 MG/ML injection 6 mg (6 mg Intravenous Given 11/02/23 1549)  propofol  (DIPRIVAN ) 10 mg/mL bolus/IV push 44.9 mg (44.9 mg Intravenous Given 11/02/23 1722)  fentaNYL  (SUBLIMAZE ) injection 50 mcg (50 mcg Intravenous Given 11/02/23 1714)  propofol  (DIPRIVAN ) 10 mg/mL bolus/IV push (45 mg Intravenous Given 11/02/23 1723)                                    Medical Decision Making Amount and/or Complexity of Data  Reviewed Radiology: ordered.  Risk Prescription drug management.   BP (!) 156/125 (BP Location: Right Arm)   Pulse 89   Temp 99.2 F (37.3 C) (Oral)   Resp 18   SpO2 100%   65:43 PM  63 year old male with history of recurrent shoulder dislocation, spinal stenosis, DVT on Eliquis , paroxysmal A-fib, osteoarthritis presenting with complaints of shoulder pain.  History obtained through patient and through wife who is at bedside.  Around noon time patient was in the process of fixing his lunch when he reached over to grab a spatula and felt severe pain about his left shoulder.  Felt like the shoulder was dislocated.  Pain is intense now with tingling sensation down to his fingers.  He went to [facility] to be evaluated, states an x-ray was done and he was told that he had anterior shoulder dislocation but because the orthopedic specialist is not available in the office he was recommended to come to the ER for further evaluation.  His last meal was 4 hours ago.  He mention he may have dislocated his left shoulder a few days ago and it popped back in without any intervention.  He has had shoulder surgery to the same shoulder in the past by Dr. Melita.  He denies any neck pain, elbow pain, or any other injury.  Patient has had reverse shoulder arthroplasty to both left and right shoulder approximately 3 years ago.  On exam patient is laying in a prone position with his left arm daily on the side of the bed.  He is appears uncomfortable.  He has tenderness and decreased range of motion about the left shoulder without overlying skin changes.  Sensation is intact throughout the shoulder and arm.  No tenderness to left elbow or left wrist.  No midline spine tenderness.  EMR reviewed patient was seen at [facility] earlier today and there was a document of left  anterior shoulder dislocation however no imaging is available for viewing at this time.  Will give patient pain medication, start IV, anticipate  shoulder reduction with procedural sedation.  Left shoulder x-ray obtained independently reviewed inter by me which demonstrate superior dislocation of the humerus component in respect to the glenoid.  Patient is neurovascular intact.  I have performed left shoulder reduction with procedural sedation under the guidance of attending Dr. Randol.  Successful reduction of the left shoulder as demonstrated on postreduction x-ray.  Shoulder sling in place.  Pt d/c home with outpt f/u and return precaution.  Recommend OTC pain meds     Final diagnoses:  Closed dislocation of left shoulder, initial encounter    ED Discharge Orders     None          Nivia Colon, PA-C 11/02/23 1831  Randol Simmonds, MD 11/03/23 1537

## 2023-11-02 NOTE — Discharge Instructions (Signed)
 You have been evaluated for left shoulder dislocation that has been reduced in the ER.  Wear sling for the first 24 hours and follow-up closely with orthopedic specialist for further care.

## 2023-11-02 NOTE — ED Triage Notes (Signed)
 Pt ambulatory to triage with complaints of LEFT shoulder pain. PT thinks that his LEFT shoulder spontaneously dislocated this morning. Pt reports a hx of surgery on his shoulder approx 4 years ago. Distal PSM intact.

## 2023-11-02 NOTE — ED Provider Notes (Signed)
 I provided a substantive portion of the care of this patient.  I personally made/approved the management plan for this patient and take responsibility for the patient management.    .Sedation  Date/Time: 11/02/2023 5:00 PM  Performed by: Randol Simmonds, MD Authorized by: Randol Simmonds, MD   Consent:    Consent obtained:  Verbal   Consent given by:  Patient   Risks discussed:  Allergic reaction, dysrhythmia, inadequate sedation, nausea, prolonged hypoxia resulting in organ damage, prolonged sedation necessitating reversal, respiratory compromise necessitating ventilatory assistance and intubation and vomiting   Alternatives discussed:  Analgesia without sedation, anxiolysis and regional anesthesia Universal protocol:    Procedure explained and questions answered to patient or proxy's satisfaction: yes     Relevant documents present and verified: yes     Test results available: yes     Imaging studies available: yes     Required blood products, implants, devices, and special equipment available: yes     Site/side marked: yes     Immediately prior to procedure, a time out was called: yes     Patient identity confirmed:  Verbally with patient Indications:    Procedure necessitating sedation performed by:  Physician performing sedation Pre-sedation assessment:    Time since last food or drink:  6   ASA classification: class 1 - normal, healthy patient     Mouth opening:  3 or more finger widths   Thyromental distance:  4 finger widths   Mallampati score:  I - soft palate, uvula, fauces, pillars visible   Neck mobility: normal     Pre-sedation assessments completed and reviewed: airway patency, cardiovascular function, hydration status, mental status, nausea/vomiting, pain level, respiratory function and temperature   A pre-sedation assessment was completed prior to the start of the procedure Immediate pre-procedure details:    Reassessment: Patient reassessed immediately prior to procedure      Reviewed: vital signs, relevant labs/tests and NPO status     Verified: bag valve mask available, emergency equipment available, intubation equipment available, IV patency confirmed, oxygen available and suction available   Procedure details (see MAR for exact dosages):    Preoxygenation:  Nasal cannula   Sedation:  Propofol    Intended level of sedation: deep   Intra-procedure monitoring:  Blood pressure monitoring, cardiac monitor, continuous pulse oximetry, frequent LOC assessments, frequent vital sign checks and continuous capnometry   Intra-procedure events: none     Total Provider sedation time (minutes):  15 Post-procedure details:   A post-sedation assessment was completed following the completion of the procedure.   Attendance: Constant attendance by certified staff until patient recovered     Recovery: Patient returned to pre-procedure baseline     Post-sedation assessments completed and reviewed: airway patency, cardiovascular function, hydration status, mental status, nausea/vomiting, pain level, respiratory function and temperature     Patient is stable for discharge or admission: yes     Procedure completion:  Tolerated well, no immediate complications     Randol Simmonds, MD 11/03/23 1527

## 2023-11-07 DIAGNOSIS — Z96612 Presence of left artificial shoulder joint: Secondary | ICD-10-CM | POA: Diagnosis not present

## 2023-11-07 DIAGNOSIS — Z96611 Presence of right artificial shoulder joint: Secondary | ICD-10-CM | POA: Diagnosis not present

## 2023-11-09 DIAGNOSIS — Z96612 Presence of left artificial shoulder joint: Secondary | ICD-10-CM | POA: Diagnosis not present

## 2023-11-14 ENCOUNTER — Other Ambulatory Visit: Payer: Self-pay | Admitting: Orthopedic Surgery

## 2023-11-14 DIAGNOSIS — Z96612 Presence of left artificial shoulder joint: Secondary | ICD-10-CM

## 2023-11-15 ENCOUNTER — Other Ambulatory Visit (HOSPITAL_BASED_OUTPATIENT_CLINIC_OR_DEPARTMENT_OTHER): Payer: Self-pay

## 2023-11-15 MED ORDER — FLUZONE 0.5 ML IM SUSY
0.5000 mL | PREFILLED_SYRINGE | Freq: Once | INTRAMUSCULAR | 0 refills | Status: AC
  Filled 2023-11-15: qty 0.5, 1d supply, fill #0

## 2023-11-19 ENCOUNTER — Ambulatory Visit: Payer: Commercial Managed Care - PPO

## 2023-11-19 ENCOUNTER — Ambulatory Visit
Admission: RE | Admit: 2023-11-19 | Discharge: 2023-11-19 | Disposition: A | Discharge status: Home / Self Care | Source: Ambulatory Visit | Attending: Orthopedic Surgery | Admitting: Orthopedic Surgery

## 2023-11-19 DIAGNOSIS — Z96612 Presence of left artificial shoulder joint: Secondary | ICD-10-CM

## 2023-11-19 DIAGNOSIS — M19012 Primary osteoarthritis, left shoulder: Secondary | ICD-10-CM | POA: Diagnosis not present

## 2023-12-02 ENCOUNTER — Ambulatory Visit

## 2023-12-03 ENCOUNTER — Ambulatory Visit

## 2023-12-05 ENCOUNTER — Ambulatory Visit

## 2023-12-05 DIAGNOSIS — I480 Paroxysmal atrial fibrillation: Secondary | ICD-10-CM

## 2023-12-05 LAB — CUP PACEART REMOTE DEVICE CHECK
Date Time Interrogation Session: 20251202230837
Implantable Pulse Generator Implant Date: 20220727

## 2023-12-07 ENCOUNTER — Ambulatory Visit: Payer: Self-pay | Admitting: Cardiology

## 2023-12-11 NOTE — Progress Notes (Signed)
 Remote Loop Recorder Transmission

## 2023-12-14 ENCOUNTER — Other Ambulatory Visit (HOSPITAL_BASED_OUTPATIENT_CLINIC_OR_DEPARTMENT_OTHER): Payer: Self-pay

## 2023-12-24 ENCOUNTER — Ambulatory Visit: Payer: Commercial Managed Care - PPO

## 2024-01-02 ENCOUNTER — Ambulatory Visit

## 2024-01-05 ENCOUNTER — Ambulatory Visit

## 2024-01-05 DIAGNOSIS — I480 Paroxysmal atrial fibrillation: Secondary | ICD-10-CM

## 2024-01-07 ENCOUNTER — Ambulatory Visit: Payer: Self-pay | Admitting: Cardiology

## 2024-01-07 LAB — CUP PACEART REMOTE DEVICE CHECK
Date Time Interrogation Session: 20260102230721
Implantable Pulse Generator Implant Date: 20220727

## 2024-01-10 NOTE — Progress Notes (Signed)
 Remote Loop Recorder Transmission

## 2024-01-17 ENCOUNTER — Other Ambulatory Visit: Payer: Self-pay

## 2024-01-21 ENCOUNTER — Other Ambulatory Visit (HOSPITAL_BASED_OUTPATIENT_CLINIC_OR_DEPARTMENT_OTHER): Payer: Self-pay

## 2024-01-21 ENCOUNTER — Encounter (HOSPITAL_BASED_OUTPATIENT_CLINIC_OR_DEPARTMENT_OTHER): Payer: Self-pay

## 2024-01-22 ENCOUNTER — Other Ambulatory Visit (HOSPITAL_BASED_OUTPATIENT_CLINIC_OR_DEPARTMENT_OTHER): Payer: Self-pay

## 2024-01-23 ENCOUNTER — Other Ambulatory Visit: Payer: Self-pay | Admitting: Urgent Care

## 2024-01-23 ENCOUNTER — Other Ambulatory Visit (HOSPITAL_BASED_OUTPATIENT_CLINIC_OR_DEPARTMENT_OTHER): Payer: Self-pay

## 2024-01-23 DIAGNOSIS — F5101 Primary insomnia: Secondary | ICD-10-CM

## 2024-01-24 NOTE — Progress Notes (Unsigned)
 .[id number] 10 g       Established patient visit   History of Present Illness   Discussed the use of AI scribe software for clinical note transcription with the patient, who gave verbal consent to proceed.  History of Present Illness   Travis Huff is a 64 year old male who presents with symptoms of a head and chest cold.  Upper and lower respiratory symptoms - One week of head and chest cold symptoms - Cough with yellow sputum, worse at night - Nasal congestion - Headaches, improved with Tylenol  and Benadryl  - Suspected fever, temperature not checked at home - No sore throat, ear pain, facial pain, stomach issues, or chest pain  Dyspnea and chest findings - Shortness of breath, able to take only half to three-quarters of a normal breath - Crackling and popping sounds in chest with inspiration - History of asthma since birth, not active for many years  Medication use and response - Uses Delsym and hydrocodone  cough syrup, which improve nighttime cough and sleep - Previously used Tessalon  Perles for cough with good relief  Primary insomnia - Treated with Ambien  with good results, requesting refill    Physical Exam   Physical Exam Vitals reviewed.  Constitutional:      General: He is not in acute distress.    Appearance: Normal appearance. He is ill-appearing.  HENT:     Head: Normocephalic and atraumatic.     Right Ear: Ear canal and external ear normal. There is no impacted cerumen.     Left Ear: Ear canal and external ear normal. There is no impacted cerumen.     Nose: Congestion present.     Right Sinus: Maxillary sinus tenderness and frontal sinus tenderness present.     Left Sinus: Maxillary sinus tenderness and frontal sinus tenderness present.     Mouth/Throat:     Mouth: Mucous membranes are moist.     Pharynx: Posterior oropharyngeal erythema present.  Eyes:     General: No scleral icterus.       Right  eye: No discharge.        Left eye: No discharge.     Extraocular Movements: Extraocular movements intact.     Conjunctiva/sclera: Conjunctivae normal.     Pupils: Pupils are equal, round, and reactive to light.  Cardiovascular:     Rate and Rhythm: Normal rate and regular rhythm.     Pulses: Normal pulses.     Heart sounds: Normal heart sounds. No murmur heard.    No friction rub. No gallop.  Pulmonary:     Effort: Pulmonary effort is normal. No respiratory distress.     Breath sounds: Rhonchi present.  Lymphadenopathy:     Cervical: No cervical adenopathy.  Skin:    General: Skin is warm and dry.  Neurological:     Mental Status: He is alert and oriented to person, place, and time.  Psychiatric:        Mood and Affect: Mood normal.        Behavior: Behavior normal.        Thought Content: Thought content normal.        Judgment: Judgment normal.    Assessment & Plan   Sinobronchitis Bad head and chest cold with coughing up yellow junk, congestion, and headaches. Negative for flu and COVID. Previous hydrocodone -containing cough medicine provided relief. Augmentin  preferred for treatment. - Prescribed Augmentin  875 mg twice daily for 10 days. - Prescribed Tessalon  Perles, one capsule three times  daily as needed for cough. - Prescribed Tussionex cough syrup, 5 mL every 12 hours as needed for cough. - Prescribed prednisone  taper pack, instructed to take all pills on for the day first thing in the morning rather than spacing out through the day to limit effect on sleep. - Discussed potential chest x-ray if symptoms do not improve with antibiotics.  Primary insomnia Chronic insomnia managed with Ambien  10mg  as needed at bedtime.  - Refilled Ambien  prescription.     Follow up   Return if symptoms worsen or fail to improve. __________________________________ Zada FREDRIK Palin, DNP, APRN, FNP-BC Primary Care and Sports Medicine [facility]

## 2024-01-25 ENCOUNTER — Other Ambulatory Visit (HOSPITAL_BASED_OUTPATIENT_CLINIC_OR_DEPARTMENT_OTHER): Payer: Self-pay

## 2024-01-25 ENCOUNTER — Ambulatory Visit: Admitting: Medical-Surgical

## 2024-01-25 ENCOUNTER — Encounter: Payer: Self-pay | Admitting: Medical-Surgical

## 2024-01-25 ENCOUNTER — Ambulatory Visit: Admitting: Physician Assistant

## 2024-01-25 VITALS — BP 147/84 | HR 63 | Temp 99.2°F | Resp 16 | Ht 71.0 in | Wt 207.1 lb

## 2024-01-25 DIAGNOSIS — F5101 Primary insomnia: Secondary | ICD-10-CM | POA: Diagnosis not present

## 2024-01-25 DIAGNOSIS — J329 Chronic sinusitis, unspecified: Secondary | ICD-10-CM

## 2024-01-25 DIAGNOSIS — J40 Bronchitis, not specified as acute or chronic: Secondary | ICD-10-CM | POA: Diagnosis not present

## 2024-01-25 LAB — POC COVID19/FLU A&B COMBO
Covid Antigen, POC: NEGATIVE
Influenza A Antigen, POC: NEGATIVE
Influenza B Antigen, POC: NEGATIVE

## 2024-01-25 MED ORDER — AMOXICILLIN-POT CLAVULANATE 875-125 MG PO TABS
1.0000 | ORAL_TABLET | Freq: Two times a day (BID) | ORAL | 0 refills | Status: AC
  Filled 2024-01-25: qty 20, 10d supply, fill #0

## 2024-01-25 MED ORDER — HYDROCOD POLI-CHLORPHE POLI ER 10-8 MG/5ML PO SUER
5.0000 mL | Freq: Two times a day (BID) | ORAL | 0 refills | Status: AC | PRN
  Filled 2024-01-25: qty 115, 12d supply, fill #0

## 2024-01-25 MED ORDER — METHYLPREDNISOLONE 4 MG PO TBPK
ORAL_TABLET | ORAL | 0 refills | Status: AC
  Filled 2024-01-25: qty 21, 6d supply, fill #0

## 2024-01-25 MED ORDER — ZOLPIDEM TARTRATE 10 MG PO TABS
10.0000 mg | ORAL_TABLET | Freq: Every evening | ORAL | 1 refills | Status: AC | PRN
  Filled 2024-01-25: qty 90, 90d supply, fill #0

## 2024-01-25 MED ORDER — BENZONATATE 200 MG PO CAPS
200.0000 mg | ORAL_CAPSULE | Freq: Three times a day (TID) | ORAL | 0 refills | Status: AC | PRN
  Filled 2024-01-25: qty 45, 15d supply, fill #0

## 2024-02-02 ENCOUNTER — Ambulatory Visit

## 2024-02-04 ENCOUNTER — Emergency Department (HOSPITAL_COMMUNITY)

## 2024-02-04 ENCOUNTER — Other Ambulatory Visit: Payer: Self-pay

## 2024-02-04 ENCOUNTER — Encounter (HOSPITAL_COMMUNITY): Payer: Self-pay

## 2024-02-04 ENCOUNTER — Emergency Department (HOSPITAL_COMMUNITY)
Admission: EM | Admit: 2024-02-04 | Discharge: 2024-02-04 | Disposition: A | Discharge status: Home / Self Care | Source: Ambulatory Visit | Attending: Emergency Medicine | Admitting: Emergency Medicine

## 2024-02-04 DIAGNOSIS — T84028A Dislocation of other internal joint prosthesis, initial encounter: Secondary | ICD-10-CM | POA: Insufficient documentation

## 2024-02-04 DIAGNOSIS — Z7901 Long term (current) use of anticoagulants: Secondary | ICD-10-CM | POA: Insufficient documentation

## 2024-02-04 DIAGNOSIS — Z79899 Other long term (current) drug therapy: Secondary | ICD-10-CM | POA: Insufficient documentation

## 2024-02-04 DIAGNOSIS — Z96612 Presence of left artificial shoulder joint: Secondary | ICD-10-CM | POA: Insufficient documentation

## 2024-02-04 DIAGNOSIS — Y792 Prosthetic and other implants, materials and accessory orthopedic devices associated with adverse incidents: Secondary | ICD-10-CM | POA: Insufficient documentation

## 2024-02-04 DIAGNOSIS — I10 Essential (primary) hypertension: Secondary | ICD-10-CM | POA: Insufficient documentation

## 2024-02-04 DIAGNOSIS — I480 Paroxysmal atrial fibrillation: Secondary | ICD-10-CM | POA: Insufficient documentation

## 2024-02-04 DIAGNOSIS — I2510 Atherosclerotic heart disease of native coronary artery without angina pectoris: Secondary | ICD-10-CM | POA: Insufficient documentation

## 2024-02-04 MED ORDER — PROPOFOL 10 MG/ML IV BOLUS
0.5000 mg/kg | Freq: Once | INTRAVENOUS | Status: AC
  Administered 2024-02-04: 150 mg via INTRAVENOUS
  Filled 2024-02-04: qty 20

## 2024-02-04 MED ORDER — FENTANYL CITRATE (PF) 50 MCG/ML IJ SOSY
100.0000 ug | PREFILLED_SYRINGE | Freq: Once | INTRAMUSCULAR | Status: AC
  Administered 2024-02-04: 100 ug via INTRAVENOUS
  Filled 2024-02-04: qty 2

## 2024-02-04 MED ORDER — PROPOFOL 10 MG/ML IV BOLUS
INTRAVENOUS | Status: AC | PRN
  Administered 2024-02-04: 50 mg via INTRAVENOUS
  Administered 2024-02-04: 150 mg via INTRAVENOUS
  Administered 2024-02-04: 50 mg via INTRAVENOUS

## 2024-02-04 NOTE — Progress Notes (Signed)
 Orthopedic Tech Progress Note Patient Details:  Travis Huff 11-Nov-1960 [id number] Following reduction, application of sling immobilizer took place.  Ortho Devices Type of Ortho Device: Sling immobilizer Ortho Device/Splint Location: LUE Ortho Device/Splint Interventions: Ordered, Application, Adjustment   Post Interventions Patient Tolerated: Well Instructions Provided: Adjustment of device, Care of device, Poper ambulation with device  Morna Pink 02/04/2024, 11:13 AM

## 2024-02-04 NOTE — Progress Notes (Signed)
 Orthopedic update:  Mr. Travis Huff is well-known to my practice after previous bilateral shoulder reverse arthroplasties, the right performed back in 2021, left performed in 2022 with a revision of the left shoulder in October 2022 due to instability.  More recently he had a recurrent episode of left shoulder instability this past fall at which time we embarked upon a workup.  He had an unfortunate recurrent instability episode that has been reduced by the ED staff.  I did review the postreduction x-rays which demonstrate that the left shoulder is congruently aligned.  I have counseled Ms. Deike and his wife regarding these unfortunate events.  This will likely necessitate a further revision surgery.  We have discussed continued conservative management during the interim with sling immobilization and cautious use of the left arm and then we will make arrangements for follow-up in the office at which time we can discuss the technical details and timing of further revision surgery.  Franky CHRISTELLA Pointer MD

## 2024-02-04 NOTE — Progress Notes (Signed)
 Respiratory Therapist at [facility]  in room number RESU A  during procedure.  Suction with [place] at [facility] set up and ready to use. Ambu bag at [facility] and ready to use.  Patient placed on ETCO2 Nasal Cannula at  2 lpm.    Vitals at conclusion of procedure:  ETCO2  33 mmHg HR 62 RR 18 SPO2 98%

## 2024-02-05 ENCOUNTER — Ambulatory Visit

## 2024-02-05 LAB — CUP PACEART REMOTE DEVICE CHECK
Date Time Interrogation Session: 20260202230710
Implantable Pulse Generator Implant Date: 20220727

## 2024-03-07 ENCOUNTER — Ambulatory Visit

## 2024-04-07 ENCOUNTER — Ambulatory Visit

## 2024-05-08 ENCOUNTER — Ambulatory Visit

## 2024-06-08 ENCOUNTER — Ambulatory Visit

## 2024-07-09 ENCOUNTER — Ambulatory Visit

## 2024-08-09 ENCOUNTER — Ambulatory Visit

## 2024-09-09 ENCOUNTER — Ambulatory Visit

## 2024-10-10 ENCOUNTER — Ambulatory Visit

## 2024-11-10 ENCOUNTER — Ambulatory Visit

## 2024-12-11 ENCOUNTER — Ambulatory Visit

## 2025-01-11 ENCOUNTER — Ambulatory Visit
